# Patient Record
Sex: Female | Born: 1955 | Race: White | Hispanic: No | Marital: Married | Smoking: Former smoker
Health system: Southern US, Community
[De-identification: ages and names within clinical notes are randomized; demographics above are authoritative.]

## PROBLEM LIST (undated history)

## (undated) DIAGNOSIS — R079 Chest pain, unspecified: Secondary | ICD-10-CM

## (undated) DIAGNOSIS — M2040 Other hammer toe(s) (acquired), unspecified foot: Secondary | ICD-10-CM

## (undated) DIAGNOSIS — M26609 Unspecified temporomandibular joint disorder, unspecified side: Secondary | ICD-10-CM

## (undated) DIAGNOSIS — I69359 Hemiplegia and hemiparesis following cerebral infarction affecting unspecified side: Secondary | ICD-10-CM

## (undated) DIAGNOSIS — R413 Other amnesia: Secondary | ICD-10-CM

## (undated) DIAGNOSIS — R5381 Other malaise: Secondary | ICD-10-CM

## (undated) DIAGNOSIS — R51 Headache: Secondary | ICD-10-CM

## (undated) DIAGNOSIS — J189 Pneumonia, unspecified organism: Secondary | ICD-10-CM

## (undated) DIAGNOSIS — E559 Vitamin D deficiency, unspecified: Secondary | ICD-10-CM

## (undated) DIAGNOSIS — R5383 Other fatigue: Secondary | ICD-10-CM

## (undated) DIAGNOSIS — M7989 Other specified soft tissue disorders: Secondary | ICD-10-CM

## (undated) DIAGNOSIS — I729 Aneurysm of unspecified site: Secondary | ICD-10-CM

## (undated) DIAGNOSIS — R05 Cough: Secondary | ICD-10-CM

## (undated) DIAGNOSIS — E7800 Pure hypercholesterolemia, unspecified: Secondary | ICD-10-CM

## (undated) DIAGNOSIS — N390 Urinary tract infection, site not specified: Secondary | ICD-10-CM

## (undated) DIAGNOSIS — R209 Unspecified disturbances of skin sensation: Secondary | ICD-10-CM

## (undated) DIAGNOSIS — F32A Depression, unspecified: Secondary | ICD-10-CM

## (undated) DIAGNOSIS — I69398 Other sequelae of cerebral infarction: Secondary | ICD-10-CM

## (undated) DIAGNOSIS — R42 Dizziness and giddiness: Secondary | ICD-10-CM

## (undated) DIAGNOSIS — Z87442 Personal history of urinary calculi: Secondary | ICD-10-CM

## (undated) DIAGNOSIS — R350 Frequency of micturition: Secondary | ICD-10-CM

## (undated) DIAGNOSIS — F329 Major depressive disorder, single episode, unspecified: Secondary | ICD-10-CM

## (undated) DIAGNOSIS — I10 Essential (primary) hypertension: Secondary | ICD-10-CM

## (undated) DIAGNOSIS — J449 Chronic obstructive pulmonary disease, unspecified: Secondary | ICD-10-CM

## (undated) DIAGNOSIS — J3489 Other specified disorders of nose and nasal sinuses: Secondary | ICD-10-CM

## (undated) DIAGNOSIS — I639 Cerebral infarction, unspecified: Secondary | ICD-10-CM

## (undated) DIAGNOSIS — F339 Major depressive disorder, recurrent, unspecified: Secondary | ICD-10-CM

## (undated) DIAGNOSIS — J349 Unspecified disorder of nose and nasal sinuses: Secondary | ICD-10-CM

## (undated) DIAGNOSIS — R569 Unspecified convulsions: Secondary | ICD-10-CM

## (undated) DIAGNOSIS — R269 Unspecified abnormalities of gait and mobility: Secondary | ICD-10-CM

## (undated) DIAGNOSIS — F419 Anxiety disorder, unspecified: Secondary | ICD-10-CM

## (undated) DIAGNOSIS — I119 Hypertensive heart disease without heart failure: Secondary | ICD-10-CM

## (undated) DIAGNOSIS — G40909 Epilepsy, unspecified, not intractable, without status epilepticus: Secondary | ICD-10-CM

## (undated) DIAGNOSIS — H3530 Unspecified macular degeneration: Secondary | ICD-10-CM

## (undated) DIAGNOSIS — E669 Obesity, unspecified: Secondary | ICD-10-CM

## (undated) DIAGNOSIS — K219 Gastro-esophageal reflux disease without esophagitis: Secondary | ICD-10-CM

## (undated) DIAGNOSIS — E039 Hypothyroidism, unspecified: Secondary | ICD-10-CM

## (undated) DIAGNOSIS — N952 Postmenopausal atrophic vaginitis: Secondary | ICD-10-CM

## (undated) DIAGNOSIS — I517 Cardiomegaly: Secondary | ICD-10-CM

## (undated) DIAGNOSIS — D649 Anemia, unspecified: Secondary | ICD-10-CM

## (undated) DIAGNOSIS — N318 Other neuromuscular dysfunction of bladder: Secondary | ICD-10-CM

## (undated) DIAGNOSIS — E785 Hyperlipidemia, unspecified: Secondary | ICD-10-CM

## (undated) DIAGNOSIS — I671 Cerebral aneurysm, nonruptured: Secondary | ICD-10-CM

## (undated) DIAGNOSIS — R059 Cough, unspecified: Secondary | ICD-10-CM

## (undated) DIAGNOSIS — R479 Unspecified speech disturbances: Secondary | ICD-10-CM

## (undated) DIAGNOSIS — M778 Other enthesopathies, not elsewhere classified: Secondary | ICD-10-CM

## (undated) DIAGNOSIS — R0600 Dyspnea, unspecified: Secondary | ICD-10-CM

## (undated) HISTORY — DX: Cough, unspecified: R05.9

## (undated) HISTORY — DX: Gastro-esophageal reflux disease without esophagitis: K21.9

## (undated) HISTORY — DX: Cerebral aneurysm, nonruptured: I67.1

## (undated) HISTORY — DX: Unspecified macular degeneration: H35.30

## (undated) HISTORY — DX: Obesity, unspecified: E66.9

## (undated) HISTORY — DX: Cough: R05

## (undated) HISTORY — DX: Chronic obstructive pulmonary disease, unspecified: J44.9

## (undated) HISTORY — DX: Postmenopausal atrophic vaginitis: N95.2

## (undated) HISTORY — DX: Other amnesia: R41.3

## (undated) HISTORY — DX: Unspecified speech disturbances: R47.9

## (undated) HISTORY — DX: Other neuromuscular dysfunction of bladder: N31.8

## (undated) HISTORY — DX: Unspecified disturbances of skin sensation: R20.9

## (undated) HISTORY — PX: MANDIBLE FRACTURE SURGERY: SHX706

## (undated) HISTORY — DX: Dyspnea, unspecified: R06.00

## (undated) HISTORY — DX: Pure hypercholesterolemia, unspecified: E78.00

## (undated) HISTORY — DX: Other hammer toe(s) (acquired), unspecified foot: M20.40

## (undated) HISTORY — DX: Major depressive disorder, recurrent, unspecified: F33.9

## (undated) HISTORY — DX: Other specified disorders of nose and nasal sinuses: J34.89

## (undated) HISTORY — DX: Frequency of micturition: R35.0

## (undated) HISTORY — PX: BACK SURGERY: SHX140

## (undated) HISTORY — DX: Chest pain, unspecified: R07.9

## (undated) HISTORY — DX: Hemiplegia and hemiparesis following cerebral infarction affecting unspecified side: I69.359

## (undated) HISTORY — PX: ABDOMINAL HYSTERECTOMY: SHX81

## (undated) HISTORY — DX: Hypertensive heart disease without heart failure: I11.9

## (undated) HISTORY — DX: Vitamin D deficiency, unspecified: E55.9

## (undated) HISTORY — PX: OTHER SURGICAL HISTORY: SHX169

## (undated) HISTORY — PX: CEREBRAL ANGIOGRAM: SHX1326

## (undated) HISTORY — DX: Headache: R51

## (undated) HISTORY — DX: Urinary tract infection, site not specified: N39.0

## (undated) HISTORY — DX: Dizziness and giddiness: I69.398

## (undated) HISTORY — DX: Unspecified temporomandibular joint disorder, unspecified side: M26.609

## (undated) HISTORY — DX: Cardiomegaly: I51.7

## (undated) HISTORY — DX: Other enthesopathies, not elsewhere classified: M77.8

## (undated) HISTORY — DX: Unspecified disorder of nose and nasal sinuses: J34.9

## (undated) HISTORY — DX: Other fatigue: R53.83

## (undated) HISTORY — PX: LAPAROSCOPIC HYSTERECTOMY: SHX1926

## (undated) HISTORY — DX: Other specified soft tissue disorders: M79.89

## (undated) HISTORY — DX: Dizziness and giddiness: R42

## (undated) HISTORY — DX: Unspecified abnormalities of gait and mobility: R26.9

## (undated) HISTORY — DX: Hyperlipidemia, unspecified: E78.5

## (undated) HISTORY — DX: Other malaise: R53.81

---

## 1999-08-04 ENCOUNTER — Other Ambulatory Visit: Admission: RE | Admit: 1999-08-04 | Discharge: 1999-08-04 | Payer: Self-pay | Admitting: Family Medicine

## 2000-09-21 ENCOUNTER — Other Ambulatory Visit: Admission: RE | Admit: 2000-09-21 | Discharge: 2000-09-21 | Payer: Self-pay | Admitting: Obstetrics and Gynecology

## 2002-03-19 ENCOUNTER — Encounter: Admission: RE | Admit: 2002-03-19 | Discharge: 2002-06-17 | Payer: Self-pay | Admitting: Internal Medicine

## 2003-06-05 ENCOUNTER — Encounter: Payer: Self-pay | Admitting: Neurosurgery

## 2003-06-05 ENCOUNTER — Observation Stay (HOSPITAL_COMMUNITY): Admission: RE | Admit: 2003-06-05 | Discharge: 2003-06-06 | Payer: Self-pay | Admitting: Neurosurgery

## 2004-04-28 ENCOUNTER — Emergency Department (HOSPITAL_COMMUNITY): Admission: EM | Admit: 2004-04-28 | Discharge: 2004-04-28 | Payer: Self-pay | Admitting: Emergency Medicine

## 2004-04-30 ENCOUNTER — Encounter: Admission: RE | Admit: 2004-04-30 | Discharge: 2004-04-30 | Payer: Self-pay | Admitting: Internal Medicine

## 2004-05-20 ENCOUNTER — Encounter: Admission: RE | Admit: 2004-05-20 | Discharge: 2004-05-20 | Payer: Self-pay | Admitting: Internal Medicine

## 2005-05-24 ENCOUNTER — Encounter: Admission: RE | Admit: 2005-05-24 | Discharge: 2005-05-24 | Payer: Self-pay | Admitting: Internal Medicine

## 2005-05-26 ENCOUNTER — Ambulatory Visit (HOSPITAL_COMMUNITY): Admission: RE | Admit: 2005-05-26 | Discharge: 2005-05-26 | Payer: Self-pay | Admitting: Gastroenterology

## 2005-05-26 ENCOUNTER — Encounter (INDEPENDENT_AMBULATORY_CARE_PROVIDER_SITE_OTHER): Payer: Self-pay | Admitting: *Deleted

## 2005-09-04 ENCOUNTER — Inpatient Hospital Stay (HOSPITAL_COMMUNITY): Admission: EM | Admit: 2005-09-04 | Discharge: 2005-09-11 | Payer: Self-pay | Admitting: Emergency Medicine

## 2005-09-23 ENCOUNTER — Encounter: Admission: RE | Admit: 2005-09-23 | Discharge: 2005-09-23 | Payer: Self-pay | Admitting: Internal Medicine

## 2005-10-25 ENCOUNTER — Encounter: Admission: RE | Admit: 2005-10-25 | Discharge: 2005-10-25 | Payer: Self-pay | Admitting: Internal Medicine

## 2005-10-28 ENCOUNTER — Encounter: Admission: RE | Admit: 2005-10-28 | Discharge: 2005-10-28 | Payer: Self-pay | Admitting: Internal Medicine

## 2005-12-06 ENCOUNTER — Ambulatory Visit: Payer: Self-pay | Admitting: Internal Medicine

## 2006-01-24 ENCOUNTER — Ambulatory Visit: Payer: Self-pay | Admitting: Internal Medicine

## 2006-02-21 ENCOUNTER — Ambulatory Visit: Payer: Self-pay | Admitting: Internal Medicine

## 2006-06-23 ENCOUNTER — Encounter: Admission: RE | Admit: 2006-06-23 | Discharge: 2006-06-23 | Payer: Self-pay | Admitting: Internal Medicine

## 2006-07-26 ENCOUNTER — Encounter: Admission: RE | Admit: 2006-07-26 | Discharge: 2006-07-26 | Payer: Self-pay | Admitting: Internal Medicine

## 2006-11-06 ENCOUNTER — Emergency Department (HOSPITAL_COMMUNITY): Admission: EM | Admit: 2006-11-06 | Discharge: 2006-11-06 | Payer: Self-pay | Admitting: Emergency Medicine

## 2006-11-22 ENCOUNTER — Ambulatory Visit: Payer: Self-pay | Admitting: Cardiovascular Disease

## 2006-12-06 ENCOUNTER — Ambulatory Visit: Payer: Self-pay

## 2007-10-30 ENCOUNTER — Encounter: Admission: RE | Admit: 2007-10-30 | Discharge: 2007-10-30 | Payer: Self-pay | Admitting: Internal Medicine

## 2007-11-08 ENCOUNTER — Encounter: Admission: RE | Admit: 2007-11-08 | Discharge: 2007-11-08 | Payer: Self-pay | Admitting: Internal Medicine

## 2008-04-25 DIAGNOSIS — J45909 Unspecified asthma, uncomplicated: Secondary | ICD-10-CM | POA: Insufficient documentation

## 2008-04-25 DIAGNOSIS — J449 Chronic obstructive pulmonary disease, unspecified: Secondary | ICD-10-CM | POA: Insufficient documentation

## 2008-04-25 DIAGNOSIS — E785 Hyperlipidemia, unspecified: Secondary | ICD-10-CM | POA: Insufficient documentation

## 2008-04-25 DIAGNOSIS — F17200 Nicotine dependence, unspecified, uncomplicated: Secondary | ICD-10-CM | POA: Insufficient documentation

## 2008-04-28 ENCOUNTER — Ambulatory Visit: Payer: Self-pay | Admitting: Internal Medicine

## 2008-06-05 ENCOUNTER — Ambulatory Visit: Payer: Self-pay | Admitting: Internal Medicine

## 2008-06-10 ENCOUNTER — Ambulatory Visit: Payer: Self-pay | Admitting: Internal Medicine

## 2008-06-10 DIAGNOSIS — R05 Cough: Secondary | ICD-10-CM | POA: Insufficient documentation

## 2008-06-10 DIAGNOSIS — R059 Cough, unspecified: Secondary | ICD-10-CM | POA: Insufficient documentation

## 2008-06-10 LAB — CONVERTED CEMR LAB: A-1 Antitrypsin, Ser: 217 mg/dL — ABNORMAL HIGH (ref 83–200)

## 2008-06-11 ENCOUNTER — Ambulatory Visit: Payer: Self-pay | Admitting: Internal Medicine

## 2008-06-19 ENCOUNTER — Telehealth (INDEPENDENT_AMBULATORY_CARE_PROVIDER_SITE_OTHER): Payer: Self-pay | Admitting: *Deleted

## 2008-07-22 ENCOUNTER — Ambulatory Visit: Payer: Self-pay | Admitting: Internal Medicine

## 2008-09-04 ENCOUNTER — Ambulatory Visit: Payer: Self-pay

## 2008-09-04 ENCOUNTER — Encounter (INDEPENDENT_AMBULATORY_CARE_PROVIDER_SITE_OTHER): Payer: Self-pay | Admitting: Internal Medicine

## 2009-01-25 ENCOUNTER — Emergency Department (HOSPITAL_COMMUNITY): Admission: EM | Admit: 2009-01-25 | Discharge: 2009-01-25 | Payer: Self-pay | Admitting: Emergency Medicine

## 2009-01-25 ENCOUNTER — Emergency Department (HOSPITAL_COMMUNITY): Admission: EM | Admit: 2009-01-25 | Discharge: 2009-01-25 | Payer: Self-pay | Admitting: Family Medicine

## 2009-06-15 ENCOUNTER — Encounter: Admission: RE | Admit: 2009-06-15 | Discharge: 2009-06-15 | Payer: Self-pay | Admitting: Internal Medicine

## 2009-06-15 ENCOUNTER — Encounter: Payer: Self-pay | Admitting: Internal Medicine

## 2009-09-05 DIAGNOSIS — I639 Cerebral infarction, unspecified: Secondary | ICD-10-CM

## 2009-09-05 HISTORY — DX: Cerebral infarction, unspecified: I63.9

## 2009-10-01 ENCOUNTER — Ambulatory Visit: Payer: Self-pay | Admitting: Internal Medicine

## 2009-11-22 ENCOUNTER — Inpatient Hospital Stay (HOSPITAL_COMMUNITY): Admission: EM | Admit: 2009-11-22 | Discharge: 2009-11-25 | Payer: Self-pay | Admitting: Emergency Medicine

## 2009-11-22 ENCOUNTER — Ambulatory Visit: Payer: Self-pay | Admitting: Internal Medicine

## 2009-11-22 ENCOUNTER — Emergency Department (HOSPITAL_COMMUNITY): Admission: EM | Admit: 2009-11-22 | Discharge: 2009-11-22 | Payer: Self-pay | Admitting: Emergency Medicine

## 2009-11-23 ENCOUNTER — Encounter (INDEPENDENT_AMBULATORY_CARE_PROVIDER_SITE_OTHER): Payer: Self-pay | Admitting: Internal Medicine

## 2009-11-27 ENCOUNTER — Inpatient Hospital Stay (HOSPITAL_COMMUNITY): Admission: EM | Admit: 2009-11-27 | Discharge: 2009-12-09 | Payer: Self-pay | Admitting: Emergency Medicine

## 2009-11-27 ENCOUNTER — Ambulatory Visit: Payer: Self-pay | Admitting: Pulmonary Disease

## 2009-12-03 ENCOUNTER — Ambulatory Visit: Payer: Self-pay | Admitting: Physical Medicine & Rehabilitation

## 2009-12-31 ENCOUNTER — Emergency Department (HOSPITAL_COMMUNITY): Admission: EM | Admit: 2009-12-31 | Discharge: 2009-12-31 | Payer: Self-pay | Admitting: Emergency Medicine

## 2010-03-05 ENCOUNTER — Encounter: Admission: RE | Admit: 2010-03-05 | Discharge: 2010-06-02 | Payer: Self-pay | Admitting: Internal Medicine

## 2010-05-04 ENCOUNTER — Encounter: Admission: RE | Admit: 2010-05-04 | Discharge: 2010-05-04 | Payer: Self-pay | Admitting: Neurology

## 2010-05-28 ENCOUNTER — Ambulatory Visit (HOSPITAL_COMMUNITY): Admission: RE | Admit: 2010-05-28 | Discharge: 2010-05-28 | Payer: Self-pay | Admitting: Neurology

## 2010-06-04 ENCOUNTER — Encounter: Payer: Self-pay | Admitting: Interventional Radiology

## 2010-07-06 ENCOUNTER — Encounter: Admission: RE | Admit: 2010-07-06 | Discharge: 2010-07-06 | Payer: Self-pay | Admitting: Internal Medicine

## 2010-07-16 ENCOUNTER — Encounter: Admission: RE | Admit: 2010-07-16 | Discharge: 2010-07-16 | Payer: Self-pay | Admitting: Internal Medicine

## 2010-07-23 ENCOUNTER — Ambulatory Visit (HOSPITAL_COMMUNITY): Admission: RE | Admit: 2010-07-23 | Discharge: 2010-07-23 | Payer: Self-pay | Admitting: Gastroenterology

## 2010-09-26 ENCOUNTER — Encounter: Payer: Self-pay | Admitting: Neurology

## 2010-09-27 ENCOUNTER — Encounter: Payer: Self-pay | Admitting: Internal Medicine

## 2010-10-05 NOTE — Assessment & Plan Note (Signed)
Summary: Pulmonary/ fu copd/pft's   PCP:  Allyne Gee  Chief Complaint:  6 wk followup.  Pt states that breathing is no better.  She gets very sob walking up the stairs to her home.  She also states breathing is worse at night.  Pt states that husband says it sounds like she is choking in the night.  She also c/o fatigue "all the time"..  History of Present Illness: 23 yowf still actively smoking with PFT's in 5/07 of FEV1 69% with ratio 68% and DLC0 57%   April 28, 2008 initial eval c/o :  worse since 10/08 with doe and cough productive of green thick mucus does seem some better after antibiotics never completely clear   doe x across parking lot sob to point of stopping on arrival  to the store but then can do the ailes ok by leaning on the cart.   June 10, 2008  ov  breathing better with symbicort but still green sputum even while on abx, worse in am's but not disturbing sleep.    July 22, 2008 no change in doe, cough waxes and wanes, worse in am, not consistently purulent. Pt denies any significant sore throat, nasal congestion or excess secretions, fever, chills, sweats, unintended wt loss, pleuritic or exertional cp, orthopnea pnd or leg swelling.  Pt also denies any obvious fluctuation in symptoms with weather or environmental change or other alleviating or aggravating factors.        Serial Vital Signs/Assessments:  Comments: 2:38 PM Ambulatory Pulse Oximetry  Resting; HR__90___    02 Sat__94%ra___  Lap1 (185 feet)   HR__108___   02 Sat__94%ra___ Lap2 (185 feet)   HR___107__   02 Sat__93%ra___    Lap3 (185 feet)   HR__111___   02 Sat__93%ra___  _x__Test Completed without Difficulty ___Test Stopped due to:   By: Vernie Murders      Updated Prior Medication List: SYMBICORT 160-4.5 MCG/ACT  AERO (BUDESONIDE-FORMOTEROL FUMARATE) Two puffs in am and 2 puffs in BAYER ASPIRIN 325 MG TABS (ASPIRIN) Take 1 tablet by mouth once a day GABAPENTIN 300 MG CAPS (GABAPENTIN)  Take 1 capsule by mouth three times a day PREMARIN 0.625 MG TABS (ESTROGENS CONJUGATED) Take 1 tablet by mouth once a day LEXAPRO 20 MG TABS (ESCITALOPRAM OXALATE) Take 1 tablet by mouth once a day DIOVAN HCT 160-12.5 MG TABS (VALSARTAN-HYDROCHLOROTHIAZIDE) Take 1 tablet by mouth once a day CRESTOR 10 MG TABS (ROSUVASTATIN CALCIUM) Take 1 tablet by mouth once a day ZEGERID 40-1100 MG  CAPS (OMEPRAZOLE-SODIUM BICARBONATE) One by mouth  at bedtime FEXOFENADINE HCL 180 MG TABS (FEXOFENADINE HCL) one daily for itching sneezing or watery nose as sinus PROAIR HFA 108 (90 BASE) MCG/ACT AERS (ALBUTEROL SULFATE) inhale 2 puffs every 4 to 6 hours as needed  Current Allergies (reviewed today): No known allergies   Past Medical History:    Reviewed history from 06/10/2008 and no changes required:       Asthma       C O P D          - PFTs  01/24/06 FEV1 69% ratio 68% diffusing capacity 57% with no improvement after B2          - PFT's 10/1/ 09 FEV1 77   ratio  76    dlco                   45            no resp to  B2          - Nl alpha one antitripsin level 10/09       Hyperlipidemia   Family History:    Reviewed history from 04/25/2008 and no changes required:       lung CA-lung cancer       heart disease-father  Social History:    Reviewed history from 04/28/2008 and no changes required:       Still actively smoking   Risk Factors:  Tobacco use:  current    Vital Signs:  Patient Profile:   55 Years Old Female Weight:      167 pounds O2 Sat:      95 % O2 treatment:    Room Air Temp:     98.4 degrees F oral Pulse rate:   78 / minute BP sitting:   124 / 82  (left arm)  Vitals Entered By: Vernie Murders (July 22, 2008 2:08 PM)                 Physical Exam  ambulatory middle-aged white female who appears older than stated age in no acute distress.  wt  163 June 10, 2008 > 167 July 22, 2008  HEENT mild turbinate edema.  Oropharynx no thrush or excess pnd or  cobblestoning.  No JVD or cervical adenopathy. Mild accessory muscle hypertrophy. Trachea midline, nl thryroid. Chest was hyperinflated by percussion with diminished breath sounds and moderate increased exp time without wheeze. Hoover sign positive at mid inspiration. Regular rate and rhythm without murmur gallop or rub or increase P2.  Abd: no hsm, nl excursion. Ext warm without C,C or E.        Problem # 1:  C O P D (ICD-496) I spent extra time with the patient today explaining optimal mdi  technique.  This improved from  50-75%   I took this opportunity to educate the patient regarding the consequences of smoking in airway disorders based on all the data we have from the multiple national lung health studies indicating that smoking cessation, not choice of inhalers or physicians, is the most important aspect of care.     Each maintenance medication was reviewed in detail including most importantly the difference between maintenance and as needed and under what circumstances the prns are to be used.    Patient Instructions: 1)  Committ to quit smoking 2)  If your breathing worsens or you need to use your rescue inhaler(proaire)  more than twice weekly or wake up more than twice a month with any respiratory symptoms or require more than two rescue inhalers per year, we need to see you right away. 3)   Please schedule a follow-up appointment in 1 year, sooner if still coughing 6 weeks after smoking 4)  mucinex dm is the best choice for smokers cough   ]

## 2010-10-05 NOTE — Miscellaneous (Signed)
Summary: Orders Update pft charges  Clinical Lists Changes  Orders: Added new Service order of Carbon Monoxide diffusing w/capacity (94720) - Signed Added new Service order of Lung Volumes (94240) - Signed Added new Service order of Spirometry (Pre & Post) (94060) - Signed 

## 2010-10-05 NOTE — Assessment & Plan Note (Signed)
Summary: Pulmonary/ ext ov with smoking counseling    Primary Provider/Referring Provider:  Allyne Gee  CC:  Yearly followup.  Pt states that she has had bronchitis 3 times in the past year and PNA twice and was txed by Dr Allyne Gee.  Today she c/o prod cough with green sputum x 6 months.  She also c/o increased SOB - "anytime I move".  She states that she stays tired all of the time.Marland Kitchen  History of Present Illness: 40 yowf still actively smoking with PFT's in 5/07 of FEV1 69% with ratio 68% and DLC0 57%   April 28, 2008 initial eval c/o :  worse since 10/08 with doe and cough productive of green thick mucus does seem some better after antibiotics never completely clear   doe x across parking lot sob to point of stopping on arrival  to the store but then can do the ailes ok by leaning on the cart.   June 10, 2008  ov  breathing better with symbicort but still green sputum even while on abx, worse in am's but not disturbing sleep.    July 22, 2008 no change in doe, cough waxes and wanes, worse in am, not consistently purulent.   October 01, 2009 Yearly followup.  Pt states that she has had bronchitis 3 times in the past year and PNA twice and was txed by Dr Allyne Gee.  Today she c/o prod cough with green sputum x 6 months.  She also c/o increased SOB - "anytime I move".  She states that she stays tired all of the time and sleeping poorly with nocturnal choking. Pt denies any significant sore throat, dysphagia, itching, sneezing,  nasal congestion or excess secretions,  fever, chills, sweats, unintended wt loss, pleuritic or exertional cp, hempoptysis, orthopnea pnd or leg swelling Pt also denies any obvious fluctuation in symptoms with weather or environmental change or other alleviating or aggravating factors.       Current Medications (verified): 1)  Symbicort 160-4.5 Mcg/act  Aero (Budesonide-Formoterol Fumarate) .... Two Puffs in Am and 2 Puffs In 2)  Bayer Aspirin 325 Mg Tabs (Aspirin)  .... Take 1 Tablet By Mouth Once A Day 3)  Gabapentin 300 Mg Caps (Gabapentin) .... Take 1 Capsule By Mouth Three Times A Day 4)  Premarin 0.625 Mg Tabs (Estrogens Conjugated) .... Take 1 Tablet By Mouth Once A Day 5)  Lexapro 20 Mg Tabs (Escitalopram Oxalate) .... Take 1 Tablet By Mouth Once A Day 6)  Diovan Hct 160-12.5 Mg Tabs (Valsartan-Hydrochlorothiazide) .... Take 1 Tablet By Mouth Once A Day 7)  Crestor 10 Mg Tabs (Rosuvastatin Calcium) .... Take 1 Tablet By Mouth Once A Day 8)  Zegerid 40-1100 Mg  Caps (Omeprazole-Sodium Bicarbonate) .... One By Mouth  At Bedtime 9)  Fexofenadine Hcl 180 Mg Tabs (Fexofenadine Hcl) .... One Daily For Itching Sneezing or Watery Nose As Sinus 10)  Proair Hfa 108 (90 Base) Mcg/act Aers (Albuterol Sulfate) .... Inhale 2 Puffs Every 4 To 6 Hours As Needed 11)  Albuterol Sulfate (2.5 Mg/68ml) 0.083% Nebu (Albuterol Sulfate) .Marland Kitchen.. 1 in Nebulizer 1 To 2 Times Per Day As Needed 12)  Vitamin D3 1.25 Mg .Marland Kitchen.. 1 Every Wk 13)  Vitamin B-12 .Marland Kitchen.. 1 Inj Monthly 14)  Deplin 15 Mg Tabs (L-Methylfolate) .Marland Kitchen.. 1 Once Daily 15)  Vitamin B-12 2500 Mcg Subl (Cyanocobalamin) .Marland Kitchen.. 1 Tab 2 To 3 Times Per Day 16)  Vitamin C Cr 500 Mg Cr-Caps (Ascorbic Acid) .Marland Kitchen.. 1 Once Daily  Allergies (  verified): No Known Drug Allergies  Past History:  Past Medical History: Asthma C O P D    - PFTs  01/24/06 FEV1 69% ratio 68% diffusing capacity 57% with no improvement after B2    - PFT's 10/1/ 09 FEV1 77   ratio  76    dlco                        45            no resp to B2    - Nl alpha one antitripsin level 10/09 Hyperlipidemia Chronic cough     - Sinus ct 06/11/08 wnl  Vital Signs:  Patient profile:   55 year old female Height:      62 inches Weight:      178 pounds BMI:     32.67 O2 Sat:      90 % on Room air Temp:     98.2 degrees F oral Pulse rate:   94 / minute BP sitting:   122 / 84  (left arm)  Vitals Entered By: Vernie Murders (October 01, 2009 10:27 AM)  O2 Flow:  Room  air  Physical Exam  Additional Exam:  ambulatory middle-aged white female who appears older than stated age in no acute distress.  wt  163 June 10, 2008 > 167 July 22, 2008 > 178 October 01, 2009  HEENT mild turbinate edema.  Oropharynx no thrush or excess pnd or cobblestoning.  No JVD or cervical adenopathy. Mild accessory muscle hypertrophy. Trachea midline, nl thryroid. Chest was hyperinflated by percussion with diminished breath sounds and moderate increased exp time without wheeze. Hoover sign positive at mid inspiration. Regular rate and rhythm without murmur gallop or rub or increase P2. No edema. Abd: no hsm, nl excursion. Ext warm without cyanosis or clubbing.      CXR  Procedure date:  06/15/2009  Findings:      no active dz ? mild peribronchial thickening  Impression & Recommendations:  Problem # 1:  C O P D (ICD-496) I had an extended discussion with the patient today lasting 15 to 20 minutes of a 25 minute visit on the following issues:  I spent extra time with the patient today explaining optimal mdi  technique.  This improved from  50-75%   Each maintenance medication was reviewed in detail including most importantly the difference between maintenance and as needed and under what circumstances the prns are to be used. See instructions for specific recommendations   Rx aecopd with short course of prednisone and empiric levaquin for purulent bronchitis by hx  Problem # 2:  TOBACCO ABUSE (ICD-305.1)  > 3 min separate discussion  Discussed but not ready to committ to quit at this point - emphasized risks involved in continuing smoking and that patient should consider these in the context of the cost of smoking relative to the benefit obtained.  I took this opportunity to educate the patient regarding the consequences of smoking in airway disorders based on all the data we have from the multiple national lung health studies indicating that smoking cessation, not  choice of inhalers or physicians, is the most important aspect of care. Regular  pulmonary f/u in this setting is not going to be helpful and if she quits smoking now will not likely be needed.  Orders: Tobacco use cessation intermediate 3-10 minutes (08657)  Medications Added to Medication List This Visit: 1)  Symbicort 160-4.5 Mcg/act Aero (  Budesonide-formoterol fumarate) .... 2 puffs first thing  in am and 2 puffs again in pm about 12 hours later 2)  Albuterol Sulfate (2.5 Mg/43ml) 0.083% Nebu (Albuterol sulfate) .Marland Kitchen.. 1 in nebulizer 1 to 2 times per day as needed 3)  Vitamin D3 1.25 Mg  .Marland Kitchen.. 1 every wk 4)  Vitamin B-12  .Marland Kitchen.. 1 inj monthly 5)  Deplin 15 Mg Tabs (L-methylfolate) .Marland Kitchen.. 1 once daily 6)  Vitamin B-12 2500 Mcg Subl (Cyanocobalamin) .Marland Kitchen.. 1 tab 2 to 3 times per day 7)  Vitamin C Cr 500 Mg Cr-caps (Ascorbic acid) .Marland Kitchen.. 1 once daily 8)  * 02 2lpm At Bedtime  9)  Prednisone 10 Mg Tabs (Prednisone) .... 4 each am x 2days, 2x2days, 1x2days and stop 10)  Levaquin 750 Mg Tabs (Levofloxacin) .... One tablet by mouth daily 11)  Combivent 103-18 Mcg/act Aero (Ipratropium-albuterol) .... 2 every 4 hours if needed  Other Orders: Est. Patient Level IV (93235)  Patient Instructions: 1)  Symbicort 160 2 puffs first thing  in am and 2 puffs again in pm about 12 hours later  2)  Prefer combivent as rescue everh 4 hours as needed and only use if nebulizer when severe and not responding to combivent 3)  Levaquin 750 one daily x 5 days 4)  Prednisone 4 each am x 2days, 2x2days, 1x2days and stop  5)  Your lung function is not bad but the only way to preserve it without getting worse is stopping and continuing to smoke is interfering with the benefit of the meds 6)  GERD (REFLUX)  is a common cause of respiratory symptoms. It commonly presents without heartburn and can be treated with medication, but also with lifestyle changes including avoidance of late meals, excessive alcohol, smoking cessation,  and avoid fatty foods, chocolate, peppermint, colas, red wine, and acidic juices such as orange juice. NO MINT OR MENTHOL PRODUCTS SO NO COUGH DROPS  7)  USE SUGARLESS CANDY INSTEAD (jolley ranchers)  8)  NO OIL BASED VITAMINS  9)  Committ to quit is the key 10)  Please schedule a follow-up appointment as needed. Prescriptions: COMBIVENT 103-18 MCG/ACT AERO (IPRATROPIUM-ALBUTEROL) 2 every 4 hours if needed  #1 x 11   Entered and Authorized by:   Nyoka Cowden MD   Signed by:   Nyoka Cowden MD on 10/01/2009   Method used:   Electronically to        [facility] [id number]       [address]       [place], [place]  [address]       Ph: [phone] or [phone]       Fax: [phone]   RxID:   [id number] LEVAQUIN 750 MG  TABS (LEVOFLOXACIN) One tablet by mouth daily  #5 x 0   Entered and Authorized by:   Nyoka Cowden MD   Signed by:   Nyoka Cowden MD on 10/01/2009   Method used:   [facility] to        [facility] [id number]       [address]       [place], [place]  [address]       Ph: [phone] or [phone]       Fax: [phone]   RxID:   [id number] PREDNISONE 10 MG  TABS (PREDNISONE) 4 each am x 2days, 2x2days, 1x2days and  stop  #14 x 0   Entered and Authorized by:   Nyoka Cowden MD   Signed by:   Nyoka Cowden MD on 10/01/2009   Method used:   [facility]        [id number]       [address]       [place], [place]  [address]       Ph: [phone] or [phone]       Fax: [phone]   RxID:   [id number]

## 2010-10-05 NOTE — Assessment & Plan Note (Signed)
Summary: Pulmonary/ fu PFT's check alpha1   PCP:  Allyne Gee  Chief Complaint:  1 month followup.  Review PFT's.  Pt states cough is the same, no better or worse since last seen .  She states cough is prod with thick, and brownish green sputum.[name]  History of Present Illness: 55 yowf still actively smoking with PFT's in 5/07 of FEV1 69% with ratio 68% and DLC057%   April 28, 2008 initial eval c/o :  worse since 10/08 with doe and cough productive of green thick mucus does seem some better after antibiotics never completely clear   doe x across parking lot sob to point of stopping on arrival  to the store but then can do the ailes ok by leaning on the cart.   June 10, 2008  ov  breathing better with symbicort but still green sputum even while on abx, worse in am's but not disturbing sleep.  Pt denies any significant sore throat, nasal congestion or excess secretions, fever, chills, sweats, unintended wt loss, pleuritic or exertional cp, orthopnea pnd or leg swelling.  Pt also denies any obvious fluctuation in symptoms with weather or environmental change or other alleviating or aggravating factors.     Pt denies any increase in rescue therapy over baseline, denies waking up needing it or having early am exacerbations of coughing/wheezing/ or dyspnea       Updated Prior Medication List: SYMBICORT 160-4.5 MCG/ACT  AERO (BUDESONIDE-FORMOTEROL FUMARATE) Two puffs in am and 2 puffs in BAYER ASPIRIN 325 MG TABS (ASPIRIN) Take 1 tablet by mouth once a day GABAPENTIN 300 MG CAPS (GABAPENTIN) Take 1 capsule by mouth three times a day PREMARIN 0.625 MG TABS (ESTROGENS CONJUGATED) Take 1 tablet by mouth once a day LEXAPRO 20 MG TABS (ESCITALOPRAM OXALATE) Take 1 tablet by mouth once a day DIOVAN HCT 160-12.5 MG TABS (VALSARTAN-HYDROCHLOROTHIAZIDE) Take 1 tablet by mouth once a day CRESTOR 10 MG TABS (ROSUVASTATIN CALCIUM) Take 1 tablet by mouth once a day FEXOFENADINE HCL 180 MG TABS (FEXOFENADINE  HCL) Take 1 tablet by mouth once a day PROAIR HFA 108 (90 BASE) MCG/ACT AERS (ALBUTEROL SULFATE) inhale 2 puffs every 4 to 6 hours as needed  Current Allergies (reviewed today): No known allergies   Past Medical History:    Asthma    C O P D       - PFTs  01/24/06 FEV1 69% ratio 68% diffusing capacity 57% with no improvement after B2       - PFT's 10/1/ 09 FEV1 77   ratio  76    dlco                   45            no resp to B2    Hyperlipidemia   Family History:    Reviewed history from 04/25/2008 and no changes required:       lung CA-lung cancer       heart disease-father  Social History:    Reviewed history from 04/28/2008 and no changes required:       Still actively smoking    Review of Systems  The patient denies anorexia, fever, weight loss, weight gain, vision loss, decreased hearing, hoarseness, chest pain, syncope, dyspnea on exertion, peripheral edema, headaches, hemoptysis, abdominal pain, melena, hematochezia, severe indigestion/heartburn, hematuria, incontinence, muscle weakness, suspicious skin lesions, transient blindness, difficulty walking, depression, unusual weight change, abnormal bleeding, enlarged lymph nodes, and angioedema.  Vital Signs:  Patient Profile:   55 Years Old Female Weight:      163.50 pounds O2 Sat:      93 % O2 treatment:    Room Air Temp:     98.0 degrees F oral Pulse rate:   97 / minute BP sitting:   110 / 72  (left arm)  Vitals Entered By: Vernie Murders (June 10, 2008 11:04 AM)                 Physical Exam  ambulatory middle-aged white female who appears older than stated age in no acute distress. wt 163 > 163 June 10, 2008  HEENT mild turbinate edema.  Oropharynx no thrush or excess pnd or cobblestoning.  No JVD or cervical adenopathy. Mild accessory muscle hypertrophy. Trachea midline, nl thryroid. Chest was hyperinflated by percussion with diminished breath sounds and moderate increased exp time without wheeze.  Hoover sign positive at mid inspiration. Regular rate and rhythm without murmur gallop or rub or increase P2.  Abd: no hsm, nl excursion. Ext warm without C,C or E.        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  This improved from  50 -75%   I took this opportunity to educate the patient regarding the consequences of smoking in airway disorders based on all the data we have from the multiple national lung health studies indicating that smoking cessation, not choice of inhalers or physicians, is the most important aspect of care.    She has mostly emphysema with minimal airflow obstruction on present rx, so no change in meds needed. Does need alpha 1 AT level to complete the wu.  Problem # 2:  COUGH (ICD-786.2) disproportionate to objective findings. DDX is cough variant asthma, upper airway cough syndrome (previously termed post nasal drip syndrome) or GERD, although many studies of chronic cough show pts have more than one mechanism playing a role   Since the asthma component should be well addressed with symbicort,  try short course PPI plus proceed with sinus ct.  Medications Added to Medication List This Visit: 1)  Zegerid 40-1100 Mg Caps (Omeprazole-sodium bicarbonate) .... One by mouth  at bedtime 2)  Fexofenadine Hcl 180 Mg Tabs (Fexofenadine hcl) .... One daily for itching sneezing or watery nose as sinus  Complete Medication List: 1)  Symbicort 160-4.5 Mcg/act Aero (Budesonide-formoterol fumarate) .... Two puffs in am and 2 puffs in 2)  Bayer Aspirin 325 Mg Tabs (Aspirin) .... Take 1 tablet by mouth once a day 3)  Gabapentin 300 Mg Caps (Gabapentin) .... Take 1 capsule by mouth three times a day 4)  Premarin 0.625 Mg Tabs (Estrogens conjugated) .... Take 1 tablet by mouth once a day 5)  Lexapro 20 Mg Tabs (Escitalopram oxalate) .... Take 1 tablet by mouth once a day 6)  Diovan  Hct 160-12.5 Mg Tabs (Valsartan-hydrochlorothiazide) .... Take 1 tablet by mouth once a day 7)  Crestor 10 Mg Tabs (Rosuvastatin calcium) .... Take 1 tablet by mouth once a day 8)  Zegerid 40-1100 Mg Caps (Omeprazole-sodium bicarbonate) .... One by mouth  at bedtime 9)  Fexofenadine Hcl 180 Mg Tabs (Fexofenadine hcl) .... One daily for itching sneezing or watery nose as sinus 10)  Proair Hfa 108 (90 Base) Mcg/act Aers (Albuterol sulfate) .... Inhale 2 puffs every 4 to 6 hours as needed  Other  Orders: Misc. Referral (Misc. Ref)   Patient Instructions: 1)  GERD (gastroesophageal reflux disease) was discussed. It is a common cause of respiratory symptoms. It commonly presents in the absence of heartburn. GERD can be treated with medication, but also with lifestyle changes including avoidance of late meals, excessive alcohol, smoking cessation, and avoid fatty foods, chocolate, peppermint, colas, red wine, and acidic juices such as orange juice. NO MINT OR MENTHOL PRODUCTS 2)  USE SUGARLESS CANDY INSTEAD (jolley ranchers)  3)  Please schedule a follow-up appointment in 6 weeks, sooner if needed   Prescriptions: ZEGERID 40-1100 MG  CAPS (OMEPRAZOLE-SODIUM BICARBONATE) One by mouth  at bedtime  #34 x 2   Entered and Authorized by:   Nyoka Cowden MD   Signed by:   Nyoka Cowden MD on 06/10/2008   Method used:   [facility] [id number]       [address]       [place], [place]  [address]       Ph: [phone] or [phone]       Fax: [phone]   RxID:   [id number]  ]

## 2010-10-05 NOTE — Progress Notes (Signed)
Summary: [facility]  Phone Note Call from Patient Call back at Home Phone [phone]   Caller: Patient Call For: wert Summary of Call: [facility] [address] but they say that they did not get it  Initial call taken by: Lacinda Axon,  June 19, 2008 12:55 PM  Follow-up for Phone Call        Spoke with pharmacist Ortencia Kick at [facility].  Rx was never recieved.  Phoned in and lmom for pt to be aware. Follow-up by: Vernie Murders,  June 19, 2008 2:00 PM

## 2010-10-05 NOTE — Assessment & Plan Note (Signed)
Summary: Pulmonary/ New eval for COPD   PCP:  Allyne Gee  Chief Complaint:  Pt is here for a 55 yowf c/o increased sob with exertion and coughing up thick green sputum.  Pt also c/o difficulty sleeping due to sob. Marland Kitchen  History of Present Illness: 55 yowf still actively smoking with PFT's in 5/07 of FEV1 69% with ratio 68% and DLC057%   April 28, 2008 worse since 10/08 with doe and cough productive of green thick mucus does seem some better after antibiotics never completely clear   doe x across parking lot sob to point of stopping on arrival  to the store but then can do the ailes ok by leaning on the cart. Pt denies any significant sore throat, nasal congestion or excess secretions, fever, chills, sweats, unintended wt loss, pleuritic or exertional cp, orthopnea pnd or leg swelling.  Pt also denies any obvious fluctuation in symptoms with weather or environmental change or other alleviating or aggravating factors.     Pt denies any increase in rescue therapy over baseline, denies waking up needing it or having early am exacerbations of coughing/wheezing/ or dyspnea        Updated Prior Medication List: BAYER ASPIRIN 325 MG TABS (ASPIRIN) Take 1 tablet by mouth once a day GABAPENTIN 300 MG CAPS (GABAPENTIN) Take 1 capsule by mouth three times a day PREMARIN 0.625 MG TABS (ESTROGENS CONJUGATED) Take 1 tablet by mouth once a day LEXAPRO 20 MG TABS (ESCITALOPRAM OXALATE) Take 1 tablet by mouth once a day DIOVAN HCT 160-12.5 MG TABS (VALSARTAN-HYDROCHLOROTHIAZIDE) Take 1 tablet by mouth once a day ALBUTEROL SULFATE (2.5 MG/3ML) 0.083% NEBU (ALBUTEROL SULFATE) as needed PROAIR HFA 108 (90 BASE) MCG/ACT AERS (ALBUTEROL SULFATE) inhale 2 puffs every 4 to 6 hours as needed ADVAIR DISKUS 250-50 MCG/DOSE MISC (FLUTICASONE-SALMETEROL) inhale 1 puffs two times a day CRESTOR 10 MG TABS (ROSUVASTATIN CALCIUM) Take 1 tablet by mouth once a day FEXOFENADINE HCL 180 MG TABS (FEXOFENADINE HCL) Take 1  tablet by mouth once a day BUDEPRION SR 150 MG XR12H-TAB (BUPROPION HCL) Take 1 tablet by mouth once a day PULMICORT FLEXHALER 180 MCG/ACT AEPB (BUDESONIDE) inhale 1 puff two times a day  Current Allergies (reviewed today): No known allergies   Past Medical History:    Reviewed history from 04/25/2008 and no changes required:       Asthma       C O P D          - PFTs 01/24/06 FEV1 69% ratio 68% diffusing capacity 57% with no improvement after bronchodilators       Hyperlipidemia   Family History:    Reviewed history from 04/25/2008 and no changes required:       lung CA-lung cancer       heart disease-father  Social History:    Still actively smoking   Risk Factors:  Tobacco use:  current    Cigarettes:  Yes --  1 1/2 ppd pack(s) per day   Review of Systems  The patient denies anorexia, fever, weight loss, weight gain, vision loss, decreased hearing, hoarseness, chest pain, syncope, peripheral edema, headaches, hemoptysis, abdominal pain, melena, hematochezia, severe indigestion/heartburn, hematuria, incontinence, muscle weakness, suspicious skin lesions, transient blindness, difficulty walking, depression, unusual weight change, abnormal bleeding, and enlarged lymph nodes.     Vital Signs:  Patient Profile:   55 Years Old Female Weight:      163.13 pounds O2 Sat:      94 % O2  treatment:    Room Air Temp:     98.2 degrees F oral Pulse rate:   85 / minute BP sitting:   104 / 66  (left arm) Cuff size:   regular  Vitals Entered By: Cyndia Diver LPN (April 28, 2008 10:01 AM)             Comments Medications reviewed with patient Cyndia Diver LPN  April 28, 2008 10:03 AM      Physical Exam  ambulatory middle-aged white female who appears older than stated age in no acute distress. HEENT mild turbinate edema.  Oropharynx no thrush or excess pnd or cobblestoning.  No JVD or cervical adenopathy. Mild accessory muscle hypertrophy. Trachea midline, nl thryroid.  Chest was hyperinflated by percussion with diminished breath sounds and moderate increased exp time without wheeze. Hoover sign positive at mid inspiration. Regular rate and rhythm without murmur gallop or rub or increase P2.  Abd: no hsm, nl excursion. Ext warm without C,C or E.     CXR  Procedure date:  04/28/2008  Findings:      diffuse mild increased bronchial markings     Problem # 1:  C O P D (ICD-496) her COPD was relatively mild in 2007 so it is very unlikely it has progressed significantly over just two years, despite her continued smoking.  What likely has progressed is the asthmatic and bronchitic components which have proven refractory to her present regimen and suggest the possibility of evolving sinusitis and bronchiectasis.  I spent extra time with the patient today explaining optimal mdi  technique.  This improved from  50-75%  Recommended she stop all of her present regimen and replace it with Symbicort 160/4.52 puffs b.i.d. with the goal of needing rescue therapy less than twice daily and put all of her effort into it limiting cigarettes long-term.  for apparent refractory purulent tracheobronchitis I recommended 10 days of Augmentin with a sinus CT scan if not improved. The following medications were removed from the medication list:    Advair Diskus 250-50 Mcg/dose Misc (Fluticasone-salmeterol) ..... Inhale 1 puffs two times a day    Pulmicort Flexhaler 180 Mcg/act Aepb (Budesonide) ..... Inhale 1 puff two times a day  Her updated medication list for this problem includes:    Symbicort 160-4.5 Mcg/act Aero (Budesonide-formoterol fumarate) .Marland Kitchen..Marland Kitchen Two puffs in am and 2 puffs in    Augmentin 875-125 Mg Tabs (Amoxicillin-pot clavulanate) ..... By mouth twice daily    Prednisone 10 Mg Tabs (Prednisone) .Marland KitchenMarland KitchenMarland KitchenMarland Kitchen 4 each am x 2days, 2x2days, 1x2days and stop    Albuterol Sulfate (2.5 Mg/71ml) 0.083% Nebu (Albuterol sulfate) .Marland Kitchen... As needed    Proair Hfa 108 (90 Base) Mcg/act  Aers (Albuterol sulfate) ..... Inhale 2 puffs every 4 to 6 hours as needed  Orders: T-2 View CXR, Same Day (71020.5TC) New Patient Level V (16109)   Medications Added to Medication List This Visit: 1)  Symbicort 160-4.5 Mcg/act Aero (Budesonide-formoterol fumarate) .... Two puffs in am and 2 puffs in 2)  Crestor 10 Mg Tabs (Rosuvastatin calcium) .... Take 1 tablet by mouth once a day 3)  Advair Diskus 250-50 Mcg/dose Misc (Fluticasone-salmeterol) .... Inhale 1 puffs two times a day 4)  Fexofenadine Hcl 180 Mg Tabs (Fexofenadine hcl) .... Take 1 tablet by mouth once a day 5)  Budeprion Sr 150 Mg Xr12h-tab (Bupropion hcl) .... Take 1 tablet by mouth once a day 6)  Augmentin 875-125 Mg Tabs (Amoxicillin-pot clavulanate) .... By mouth twice daily 7)  Pulmicort Flexhaler 180 Mcg/act Aepb (Budesonide) .... Inhale 1 puff two times a day 8)  Prednisone 10 Mg Tabs (Prednisone) .... 4 each am x 2days, 2x2days, 1x2days and stop 9)  Proair Hfa 108 (90 Base) Mcg/act Aers (Albuterol sulfate) .... Inhale 2 puffs every 4 to 6 hours as needed 10)  Mucinex Dm 30-600 Mg Xr12h-tab (Dextromethorphan-guaifenesin) .Marland Kitchen.. 1-2 every 12 hours for cough and or congestion  Complete Medication List: 1)  Symbicort 160-4.5 Mcg/act Aero (Budesonide-formoterol fumarate) .... Two puffs in am and 2 puffs in 2)  Bayer Aspirin 325 Mg Tabs (Aspirin) .... Take 1 tablet by mouth once a day 3)  Gabapentin 300 Mg Caps (Gabapentin) .... Take 1 capsule by mouth three times a day 4)  Premarin 0.625 Mg Tabs (Estrogens conjugated) .... Take 1 tablet by mouth once a day 5)  Lexapro 20 Mg Tabs (Escitalopram oxalate) .... Take 1 tablet by mouth once a day 6)  Diovan Hct 160-12.5 Mg Tabs (Valsartan-hydrochlorothiazide) .... Take 1 tablet by mouth once a day 7)  Crestor 10 Mg Tabs (Rosuvastatin calcium) .... Take 1 tablet by mouth once a day 8)  Fexofenadine Hcl 180 Mg Tabs (Fexofenadine hcl) .... Take 1 tablet by mouth once a day 9)   Budeprion Sr 150 Mg Xr12h-tab (Bupropion hcl) .... Take 1 tablet by mouth once a day 10)  Augmentin 875-125 Mg Tabs (Amoxicillin-pot clavulanate) .... By mouth twice daily 11)  Prednisone 10 Mg Tabs (Prednisone) .... 4 each am x 2days, 2x2days, 1x2days and stop 12)  Albuterol Sulfate (2.5 Mg/72ml) 0.083% Nebu (Albuterol sulfate) .... As needed 13)  Proair Hfa 108 (90 Base) Mcg/act Aers (Albuterol sulfate) .... Inhale 2 puffs every 4 to 6 hours as needed 14)  Mucinex Dm 30-600 Mg Xr12h-tab (Dextromethorphan-guaifenesin) .Marland Kitchen.. 1-2 every 12 hours for cough and or congestion   Patient Instructions: 1)  work on inhaler technique 2)  Please schedule a follow-up appointment in 1 month with PFT's   Prescriptions: PREDNISONE 10 MG  TABS (PREDNISONE) 4 each am x 2days, 2x2days, 1x2days and stop  #14 x 0   Entered and Authorized by:   Nyoka Cowden MD   Signed by:   Nyoka Cowden MD on 04/28/2008   Method used:   Electronically to        [facility] [id number] (retail)       [address]       [place], [place]  [address]       Ph: [phone] or [phone]       Fax: [phone]   RxID:   [id number] SYMBICORT 160-4.5 MCG/ACT  AERO (BUDESONIDE-FORMOTEROL FUMARATE) Two puffs in am and 2 puffs in  #1 x 11   Entered and Authorized by:   Nyoka Cowden MD   Signed by:   Nyoka Cowden MD on 04/28/2008   Method used:   Electronically to        [id number] (retail)       [address]       [place], [place]  [address]       Ph: [phone] or [phone]       Fax: [phone]   RxID:   [id number] AUGMENTIN 875-125 MG  TABS (AMOXICILLIN-POT CLAVULANATE) By mouth twice daily  #20 x 0   Entered and Authorized by:   Nyoka Cowden MD   Signed by:  Nyoka Cowden MD on 04/28/2008   Method used:   Electronically to        [id number] ([facility]       [address]       [place], [place]  [address]       Ph: [phone] or [phone]       Fax: [phone]   RxID:   [id number]  ]

## 2010-10-27 ENCOUNTER — Other Ambulatory Visit (HOSPITAL_COMMUNITY): Payer: Self-pay | Admitting: Interventional Radiology

## 2010-10-27 DIAGNOSIS — I729 Aneurysm of unspecified site: Secondary | ICD-10-CM

## 2010-11-05 ENCOUNTER — Ambulatory Visit (HOSPITAL_COMMUNITY)
Admission: RE | Admit: 2010-11-05 | Discharge: 2010-11-05 | Disposition: A | Discharge status: Home / Self Care | Payer: 59 | Source: Ambulatory Visit | Attending: Interventional Radiology | Admitting: Interventional Radiology

## 2010-11-05 ENCOUNTER — Other Ambulatory Visit (HOSPITAL_COMMUNITY): Payer: Self-pay | Admitting: Interventional Radiology

## 2010-11-05 DIAGNOSIS — E538 Deficiency of other specified B group vitamins: Secondary | ICD-10-CM | POA: Insufficient documentation

## 2010-11-05 DIAGNOSIS — I671 Cerebral aneurysm, nonruptured: Secondary | ICD-10-CM | POA: Insufficient documentation

## 2010-11-05 DIAGNOSIS — M545 Low back pain, unspecified: Secondary | ICD-10-CM | POA: Insufficient documentation

## 2010-11-05 DIAGNOSIS — J4489 Other specified chronic obstructive pulmonary disease: Secondary | ICD-10-CM | POA: Insufficient documentation

## 2010-11-05 DIAGNOSIS — Z87891 Personal history of nicotine dependence: Secondary | ICD-10-CM | POA: Insufficient documentation

## 2010-11-05 DIAGNOSIS — I10 Essential (primary) hypertension: Secondary | ICD-10-CM | POA: Insufficient documentation

## 2010-11-05 DIAGNOSIS — I729 Aneurysm of unspecified site: Secondary | ICD-10-CM

## 2010-11-05 DIAGNOSIS — F3289 Other specified depressive episodes: Secondary | ICD-10-CM | POA: Insufficient documentation

## 2010-11-05 DIAGNOSIS — G8929 Other chronic pain: Secondary | ICD-10-CM | POA: Insufficient documentation

## 2010-11-05 DIAGNOSIS — J449 Chronic obstructive pulmonary disease, unspecified: Secondary | ICD-10-CM | POA: Insufficient documentation

## 2010-11-05 DIAGNOSIS — F329 Major depressive disorder, single episode, unspecified: Secondary | ICD-10-CM | POA: Insufficient documentation

## 2010-11-05 DIAGNOSIS — Z8673 Personal history of transient ischemic attack (TIA), and cerebral infarction without residual deficits: Secondary | ICD-10-CM | POA: Insufficient documentation

## 2010-11-05 LAB — CBC
HCT: 38.2 % (ref 36.0–46.0)
Hemoglobin: 12.3 g/dL (ref 12.0–15.0)
MCH: 30.3 pg (ref 26.0–34.0)
MCHC: 32.2 g/dL (ref 30.0–36.0)
MCV: 94.1 fL (ref 78.0–100.0)
Platelets: 215 10*3/uL (ref 150–400)
RBC: 4.06 MIL/uL (ref 3.87–5.11)
RDW: 13.7 % (ref 11.5–15.5)
WBC: 8 10*3/uL (ref 4.0–10.5)

## 2010-11-05 LAB — PROTIME-INR
INR: 0.89 (ref 0.00–1.49)
Prothrombin Time: 12.2 seconds (ref 11.6–15.2)

## 2010-11-05 LAB — POCT I-STAT, CHEM 8
BUN: 22 mg/dL (ref 6–23)
Calcium, Ion: 1.17 mmol/L (ref 1.12–1.32)
Chloride: 106 mEq/L (ref 96–112)
Creatinine, Ser: 0.8 mg/dL (ref 0.4–1.2)
Glucose, Bld: 83 mg/dL (ref 70–99)
HCT: 38 % (ref 36.0–46.0)
Hemoglobin: 12.9 g/dL (ref 12.0–15.0)
Potassium: 4 mEq/L (ref 3.5–5.1)
Sodium: 141 mEq/L (ref 135–145)
TCO2: 25 mmol/L (ref 0–100)

## 2010-11-05 LAB — APTT: aPTT: 30 seconds (ref 24–37)

## 2010-11-05 MED ORDER — IOHEXOL 300 MG/ML  SOLN: 150.0000 mL | Freq: Once | INTRAMUSCULAR | Status: AC | PRN

## 2010-11-18 LAB — BASIC METABOLIC PANEL
BUN: 13 mg/dL (ref 6–23)
CO2: 27 mEq/L (ref 19–32)
Calcium: 9.6 mg/dL (ref 8.4–10.5)
Chloride: 108 mEq/L (ref 96–112)
Creatinine, Ser: 0.54 mg/dL (ref 0.4–1.2)
GFR calc Af Amer: >60 mL/min (ref >60)
GFR calc non Af Amer: >60 mL/min (ref >60)
Glucose, Bld: 88 mg/dL (ref 70–99)
Potassium: 4 mEq/L (ref 3.5–5.1)
Sodium: 141 mEq/L (ref 135–145)

## 2010-11-18 LAB — CBC
HCT: 38.2 % (ref 36.0–46.0)
Hemoglobin: 12.4 g/dL (ref 12.0–15.0)
MCH: 31 pg (ref 26.0–34.0)
MCHC: 32.5 g/dL (ref 30.0–36.0)
MCV: 95.5 fL (ref 78.0–100.0)
Platelets: 217 10*3/uL (ref 150–400)
RBC: 4 MIL/uL (ref 3.87–5.11)
RDW: 13.7 % (ref 11.5–15.5)
WBC: 8.4 10*3/uL (ref 4.0–10.5)

## 2010-11-18 LAB — PROTIME-INR
INR: 0.91 (ref 0.00–1.49)
Prothrombin Time: 12.5 seconds (ref 11.6–15.2)

## 2010-11-18 LAB — APTT: aPTT: 32 seconds (ref 24–37)

## 2010-11-23 LAB — URINALYSIS, ROUTINE W REFLEX MICROSCOPIC
Bilirubin Urine: NEGATIVE
Glucose, UA: NEGATIVE mg/dL
Ketones, ur: NEGATIVE mg/dL
Nitrite: NEGATIVE
Protein, ur: NEGATIVE mg/dL
Specific Gravity, Urine: 1.008 (ref 1.005–1.030)
Urobilinogen, UA: 0.2 mg/dL (ref 0.0–1.0)
pH: 6 (ref 5.0–8.0)

## 2010-11-23 LAB — URINE MICROSCOPIC-ADD ON

## 2010-11-24 LAB — GLUCOSE, CAPILLARY
Glucose-Capillary: 105 mg/dL — ABNORMAL HIGH (ref 70–99)
Glucose-Capillary: 105 mg/dL — ABNORMAL HIGH (ref 70–99)
Glucose-Capillary: 106 mg/dL — ABNORMAL HIGH (ref 70–99)
Glucose-Capillary: 108 mg/dL — ABNORMAL HIGH (ref 70–99)
Glucose-Capillary: 109 mg/dL — ABNORMAL HIGH (ref 70–99)
Glucose-Capillary: 114 mg/dL — ABNORMAL HIGH (ref 70–99)
Glucose-Capillary: 116 mg/dL — ABNORMAL HIGH (ref 70–99)
Glucose-Capillary: 117 mg/dL — ABNORMAL HIGH (ref 70–99)
Glucose-Capillary: 120 mg/dL — ABNORMAL HIGH (ref 70–99)
Glucose-Capillary: 124 mg/dL — ABNORMAL HIGH (ref 70–99)
Glucose-Capillary: 126 mg/dL — ABNORMAL HIGH (ref 70–99)
Glucose-Capillary: 133 mg/dL — ABNORMAL HIGH (ref 70–99)
Glucose-Capillary: 134 mg/dL — ABNORMAL HIGH (ref 70–99)
Glucose-Capillary: 135 mg/dL — ABNORMAL HIGH (ref 70–99)
Glucose-Capillary: 138 mg/dL — ABNORMAL HIGH (ref 70–99)
Glucose-Capillary: 141 mg/dL — ABNORMAL HIGH (ref 70–99)
Glucose-Capillary: 148 mg/dL — ABNORMAL HIGH (ref 70–99)
Glucose-Capillary: 149 mg/dL — ABNORMAL HIGH (ref 70–99)
Glucose-Capillary: 150 mg/dL — ABNORMAL HIGH (ref 70–99)
Glucose-Capillary: 153 mg/dL — ABNORMAL HIGH (ref 70–99)
Glucose-Capillary: 153 mg/dL — ABNORMAL HIGH (ref 70–99)
Glucose-Capillary: 154 mg/dL — ABNORMAL HIGH (ref 70–99)
Glucose-Capillary: 166 mg/dL — ABNORMAL HIGH (ref 70–99)
Glucose-Capillary: 171 mg/dL — ABNORMAL HIGH (ref 70–99)
Glucose-Capillary: 178 mg/dL — ABNORMAL HIGH (ref 70–99)
Glucose-Capillary: 184 mg/dL — ABNORMAL HIGH (ref 70–99)
Glucose-Capillary: 198 mg/dL — ABNORMAL HIGH (ref 70–99)
Glucose-Capillary: 203 mg/dL — ABNORMAL HIGH (ref 70–99)
Glucose-Capillary: 97 mg/dL (ref 70–99)

## 2010-11-24 LAB — BASIC METABOLIC PANEL
BUN: 17 mg/dL (ref 6–23)
BUN: 18 mg/dL (ref 6–23)
BUN: 20 mg/dL (ref 6–23)
CO2: 32 mEq/L (ref 19–32)
CO2: 33 mEq/L — ABNORMAL HIGH (ref 19–32)
CO2: 34 mEq/L — ABNORMAL HIGH (ref 19–32)
Calcium: 9.4 mg/dL (ref 8.4–10.5)
Calcium: 9.6 mg/dL (ref 8.4–10.5)
Calcium: 9.8 mg/dL (ref 8.4–10.5)
Chloride: 97 mEq/L (ref 96–112)
Chloride: 98 mEq/L (ref 96–112)
Chloride: 99 mEq/L (ref 96–112)
Creatinine, Ser: 0.53 mg/dL (ref 0.4–1.2)
Creatinine, Ser: 0.55 mg/dL (ref 0.4–1.2)
Creatinine, Ser: 0.56 mg/dL (ref 0.4–1.2)
GFR calc Af Amer: >60 mL/min (ref >60)
GFR calc Af Amer: >60 mL/min (ref >60)
GFR calc Af Amer: >60 mL/min (ref >60)
GFR calc non Af Amer: >60 mL/min (ref >60)
GFR calc non Af Amer: >60 mL/min (ref >60)
GFR calc non Af Amer: >60 mL/min (ref >60)
Glucose, Bld: 110 mg/dL — ABNORMAL HIGH (ref 70–99)
Glucose, Bld: 115 mg/dL — ABNORMAL HIGH (ref 70–99)
Glucose, Bld: 116 mg/dL — ABNORMAL HIGH (ref 70–99)
Potassium: 3.4 mEq/L — ABNORMAL LOW (ref 3.5–5.1)
Potassium: 3.5 mEq/L (ref 3.5–5.1)
Potassium: 4.1 mEq/L (ref 3.5–5.1)
Sodium: 139 mEq/L (ref 135–145)
Sodium: 140 mEq/L (ref 135–145)
Sodium: 142 mEq/L (ref 135–145)

## 2010-11-24 LAB — CBC
HCT: 41.7 % (ref 36.0–46.0)
HCT: 42.9 % (ref 36.0–46.0)
HCT: 44.5 % (ref 36.0–46.0)
Hemoglobin: 13.9 g/dL (ref 12.0–15.0)
Hemoglobin: 14 g/dL (ref 12.0–15.0)
Hemoglobin: 14.9 g/dL (ref 12.0–15.0)
MCHC: 32.7 g/dL (ref 30.0–36.0)
MCHC: 33.3 g/dL (ref 30.0–36.0)
MCHC: 33.4 g/dL (ref 30.0–36.0)
MCV: 102.4 fL — ABNORMAL HIGH (ref 78.0–100.0)
MCV: 102.6 fL — ABNORMAL HIGH (ref 78.0–100.0)
MCV: 102.8 fL — ABNORMAL HIGH (ref 78.0–100.0)
Platelets: 190 10*3/uL (ref 150–400)
Platelets: 207 10*3/uL (ref 150–400)
Platelets: 208 10*3/uL (ref 150–400)
RBC: 4.05 MIL/uL (ref 3.87–5.11)
RBC: 4.18 MIL/uL (ref 3.87–5.11)
RBC: 4.34 MIL/uL (ref 3.87–5.11)
RDW: 14.9 % (ref 11.5–15.5)
RDW: 15.1 % (ref 11.5–15.5)
RDW: 15.1 % (ref 11.5–15.5)
WBC: 10.9 10*3/uL — ABNORMAL HIGH (ref 4.0–10.5)
WBC: 11.7 10*3/uL — ABNORMAL HIGH (ref 4.0–10.5)
WBC: 13.1 10*3/uL — ABNORMAL HIGH (ref 4.0–10.5)

## 2010-11-24 LAB — PHENYTOIN LEVEL, TOTAL: Phenytoin Lvl: 8.2 ug/mL — ABNORMAL LOW (ref 10.0–20.0)

## 2010-11-24 LAB — MAGNESIUM: Magnesium: 2.3 mg/dL (ref 1.5–2.5)

## 2010-11-24 LAB — PHOSPHORUS: Phosphorus: 5.2 mg/dL — ABNORMAL HIGH (ref 2.3–4.6)

## 2010-11-29 LAB — CBC
HCT: 32.5 % — ABNORMAL LOW (ref 36.0–46.0)
HCT: 32.5 % — ABNORMAL LOW (ref 36.0–46.0)
HCT: 35.9 % — ABNORMAL LOW (ref 36.0–46.0)
HCT: 36.1 % (ref 36.0–46.0)
HCT: 36.8 % (ref 36.0–46.0)
HCT: 38.7 % (ref 36.0–46.0)
HCT: 39.3 % (ref 36.0–46.0)
HCT: 42.6 % (ref 36.0–46.0)
HCT: 43.9 % (ref 36.0–46.0)
Hemoglobin: 11 g/dL — ABNORMAL LOW (ref 12.0–15.0)
Hemoglobin: 11.1 g/dL — ABNORMAL LOW (ref 12.0–15.0)
Hemoglobin: 11.9 g/dL — ABNORMAL LOW (ref 12.0–15.0)
Hemoglobin: 12.3 g/dL (ref 12.0–15.0)
Hemoglobin: 12.5 g/dL (ref 12.0–15.0)
Hemoglobin: 13.1 g/dL (ref 12.0–15.0)
Hemoglobin: 13.1 g/dL (ref 12.0–15.0)
Hemoglobin: 14.1 g/dL (ref 12.0–15.0)
Hemoglobin: 14.7 g/dL (ref 12.0–15.0)
MCHC: 33 g/dL (ref 30.0–36.0)
MCHC: 33.2 g/dL (ref 30.0–36.0)
MCHC: 33.4 g/dL (ref 30.0–36.0)
MCHC: 33.6 g/dL (ref 30.0–36.0)
MCHC: 33.8 g/dL (ref 30.0–36.0)
MCHC: 34 g/dL (ref 30.0–36.0)
MCHC: 34 g/dL (ref 30.0–36.0)
MCHC: 34 g/dL (ref 30.0–36.0)
MCHC: 34.2 g/dL (ref 30.0–36.0)
MCV: 101.3 fL — ABNORMAL HIGH (ref 78.0–100.0)
MCV: 101.7 fL — ABNORMAL HIGH (ref 78.0–100.0)
MCV: 101.8 fL — ABNORMAL HIGH (ref 78.0–100.0)
MCV: 101.8 fL — ABNORMAL HIGH (ref 78.0–100.0)
MCV: 102.4 fL — ABNORMAL HIGH (ref 78.0–100.0)
MCV: 102.4 fL — ABNORMAL HIGH (ref 78.0–100.0)
MCV: 102.8 fL — ABNORMAL HIGH (ref 78.0–100.0)
MCV: 103 fL — ABNORMAL HIGH (ref 78.0–100.0)
MCV: 94.5 fL (ref 78.0–100.0)
Platelets: 119 10*3/uL — ABNORMAL LOW (ref 150–400)
Platelets: 124 10*3/uL — ABNORMAL LOW (ref 150–400)
Platelets: 144 10*3/uL — ABNORMAL LOW (ref 150–400)
Platelets: 145 10*3/uL — ABNORMAL LOW (ref 150–400)
Platelets: 151 10*3/uL (ref 150–400)
Platelets: 157 10*3/uL (ref 150–400)
Platelets: 164 10*3/uL (ref 150–400)
Platelets: 180 10*3/uL (ref 150–400)
Platelets: 326 10*3/uL (ref 150–400)
RBC: 3.19 MIL/uL — ABNORMAL LOW (ref 3.87–5.11)
RBC: 3.2 MIL/uL — ABNORMAL LOW (ref 3.87–5.11)
RBC: 3.52 MIL/uL — ABNORMAL LOW (ref 3.87–5.11)
RBC: 3.53 MIL/uL — ABNORMAL LOW (ref 3.87–5.11)
RBC: 3.57 MIL/uL — ABNORMAL LOW (ref 3.87–5.11)
RBC: 3.81 MIL/uL — ABNORMAL LOW (ref 3.87–5.11)
RBC: 3.82 MIL/uL — ABNORMAL LOW (ref 3.87–5.11)
RBC: 4.28 MIL/uL (ref 3.87–5.11)
RBC: 4.5 MIL/uL (ref 3.87–5.11)
RDW: 14.9 % (ref 11.5–15.5)
RDW: 15 % (ref 11.5–15.5)
RDW: 15 % (ref 11.5–15.5)
RDW: 15.1 % (ref 11.5–15.5)
RDW: 15.2 % (ref 11.5–15.5)
RDW: 15.3 % (ref 11.5–15.5)
RDW: 15.3 % (ref 11.5–15.5)
RDW: 15.3 % (ref 11.5–15.5)
RDW: 16 % — ABNORMAL HIGH (ref 11.5–15.5)
WBC: 10.3 10*3/uL (ref 4.0–10.5)
WBC: 10.6 10*3/uL — ABNORMAL HIGH (ref 4.0–10.5)
WBC: 34.6 10*3/uL — ABNORMAL HIGH (ref 4.0–10.5)
WBC: 6.1 10*3/uL (ref 4.0–10.5)
WBC: 6.3 10*3/uL (ref 4.0–10.5)
WBC: 7.8 10*3/uL (ref 4.0–10.5)
WBC: 8.5 10*3/uL (ref 4.0–10.5)
WBC: 8.8 10*3/uL (ref 4.0–10.5)
WBC: 9.4 10*3/uL (ref 4.0–10.5)

## 2010-11-29 LAB — COMPREHENSIVE METABOLIC PANEL
ALT: 12 U/L (ref 0–35)
ALT: 24 U/L (ref 0–35)
AST: 17 U/L (ref 0–37)
AST: 25 U/L (ref 0–37)
Albumin: 2.8 g/dL — ABNORMAL LOW (ref 3.5–5.2)
Albumin: 3.2 g/dL — ABNORMAL LOW (ref 3.5–5.2)
Alkaline Phosphatase: 59 U/L (ref 39–117)
Alkaline Phosphatase: 65 U/L (ref 39–117)
BUN: 18 mg/dL (ref 6–23)
BUN: 21 mg/dL (ref 6–23)
CO2: 28 mEq/L (ref 19–32)
CO2: 31 mEq/L (ref 19–32)
Calcium: 8.4 mg/dL (ref 8.4–10.5)
Calcium: 8.8 mg/dL (ref 8.4–10.5)
Chloride: 102 mEq/L (ref 96–112)
Chloride: 102 mEq/L (ref 96–112)
Creatinine, Ser: 0.5 mg/dL (ref 0.4–1.2)
Creatinine, Ser: 0.69 mg/dL (ref 0.4–1.2)
GFR calc Af Amer: >60 mL/min (ref >60)
GFR calc Af Amer: >60 mL/min (ref >60)
GFR calc non Af Amer: >60 mL/min (ref >60)
GFR calc non Af Amer: >60 mL/min (ref >60)
Glucose, Bld: 104 mg/dL — ABNORMAL HIGH (ref 70–99)
Glucose, Bld: 99 mg/dL (ref 70–99)
Potassium: 3.2 mEq/L — ABNORMAL LOW (ref 3.5–5.1)
Potassium: 3.6 mEq/L (ref 3.5–5.1)
Sodium: 140 mEq/L (ref 135–145)
Sodium: 142 mEq/L (ref 135–145)
Total Bilirubin: 0.2 mg/dL — ABNORMAL LOW (ref 0.3–1.2)
Total Bilirubin: 0.5 mg/dL (ref 0.3–1.2)
Total Protein: 6 g/dL (ref 6.0–8.3)
Total Protein: 6.2 g/dL (ref 6.0–8.3)

## 2010-11-29 LAB — MAGNESIUM
Magnesium: 1.7 mg/dL (ref 1.5–2.5)
Magnesium: 1.9 mg/dL (ref 1.5–2.5)
Magnesium: 1.9 mg/dL (ref 1.5–2.5)
Magnesium: 2.1 mg/dL (ref 1.5–2.5)

## 2010-11-29 LAB — CSF CELL COUNT WITH DIFFERENTIAL
RBC Count, CSF: 2 /mm3 — ABNORMAL HIGH
RBC Count, CSF: 3 /mm3 — ABNORMAL HIGH
Tube #: 1
Tube #: 4
WBC, CSF: 2 /mm3 (ref 0–5)
WBC, CSF: 3 /mm3 (ref 0–5)

## 2010-11-29 LAB — GLUCOSE, CAPILLARY
Glucose-Capillary: 100 mg/dL — ABNORMAL HIGH (ref 70–99)
Glucose-Capillary: 103 mg/dL — ABNORMAL HIGH (ref 70–99)
Glucose-Capillary: 103 mg/dL — ABNORMAL HIGH (ref 70–99)
Glucose-Capillary: 106 mg/dL — ABNORMAL HIGH (ref 70–99)
Glucose-Capillary: 106 mg/dL — ABNORMAL HIGH (ref 70–99)
Glucose-Capillary: 106 mg/dL — ABNORMAL HIGH (ref 70–99)
Glucose-Capillary: 109 mg/dL — ABNORMAL HIGH (ref 70–99)
Glucose-Capillary: 110 mg/dL — ABNORMAL HIGH (ref 70–99)
Glucose-Capillary: 111 mg/dL — ABNORMAL HIGH (ref 70–99)
Glucose-Capillary: 111 mg/dL — ABNORMAL HIGH (ref 70–99)
Glucose-Capillary: 112 mg/dL — ABNORMAL HIGH (ref 70–99)
Glucose-Capillary: 112 mg/dL — ABNORMAL HIGH (ref 70–99)
Glucose-Capillary: 113 mg/dL — ABNORMAL HIGH (ref 70–99)
Glucose-Capillary: 114 mg/dL — ABNORMAL HIGH (ref 70–99)
Glucose-Capillary: 115 mg/dL — ABNORMAL HIGH (ref 70–99)
Glucose-Capillary: 116 mg/dL — ABNORMAL HIGH (ref 70–99)
Glucose-Capillary: 119 mg/dL — ABNORMAL HIGH (ref 70–99)
Glucose-Capillary: 119 mg/dL — ABNORMAL HIGH (ref 70–99)
Glucose-Capillary: 121 mg/dL — ABNORMAL HIGH (ref 70–99)
Glucose-Capillary: 123 mg/dL — ABNORMAL HIGH (ref 70–99)
Glucose-Capillary: 124 mg/dL — ABNORMAL HIGH (ref 70–99)
Glucose-Capillary: 126 mg/dL — ABNORMAL HIGH (ref 70–99)
Glucose-Capillary: 130 mg/dL — ABNORMAL HIGH (ref 70–99)
Glucose-Capillary: 133 mg/dL — ABNORMAL HIGH (ref 70–99)
Glucose-Capillary: 133 mg/dL — ABNORMAL HIGH (ref 70–99)
Glucose-Capillary: 133 mg/dL — ABNORMAL HIGH (ref 70–99)
Glucose-Capillary: 135 mg/dL — ABNORMAL HIGH (ref 70–99)
Glucose-Capillary: 135 mg/dL — ABNORMAL HIGH (ref 70–99)
Glucose-Capillary: 138 mg/dL — ABNORMAL HIGH (ref 70–99)
Glucose-Capillary: 139 mg/dL — ABNORMAL HIGH (ref 70–99)
Glucose-Capillary: 144 mg/dL — ABNORMAL HIGH (ref 70–99)
Glucose-Capillary: 152 mg/dL — ABNORMAL HIGH (ref 70–99)
Glucose-Capillary: 153 mg/dL — ABNORMAL HIGH (ref 70–99)
Glucose-Capillary: 154 mg/dL — ABNORMAL HIGH (ref 70–99)
Glucose-Capillary: 156 mg/dL — ABNORMAL HIGH (ref 70–99)
Glucose-Capillary: 160 mg/dL — ABNORMAL HIGH (ref 70–99)
Glucose-Capillary: 161 mg/dL — ABNORMAL HIGH (ref 70–99)
Glucose-Capillary: 169 mg/dL — ABNORMAL HIGH (ref 70–99)
Glucose-Capillary: 170 mg/dL — ABNORMAL HIGH (ref 70–99)
Glucose-Capillary: 170 mg/dL — ABNORMAL HIGH (ref 70–99)
Glucose-Capillary: 175 mg/dL — ABNORMAL HIGH (ref 70–99)
Glucose-Capillary: 198 mg/dL — ABNORMAL HIGH (ref 70–99)
Glucose-Capillary: 85 mg/dL (ref 70–99)
Glucose-Capillary: 86 mg/dL (ref 70–99)
Glucose-Capillary: 90 mg/dL (ref 70–99)
Glucose-Capillary: 94 mg/dL (ref 70–99)
Glucose-Capillary: 96 mg/dL (ref 70–99)
Glucose-Capillary: 97 mg/dL (ref 70–99)
Glucose-Capillary: 98 mg/dL (ref 70–99)

## 2010-11-29 LAB — BLOOD GAS, ARTERIAL
Acid-Base Excess: 0.4 mmol/L (ref 0.0–2.0)
Acid-Base Excess: 1.5 mmol/L (ref 0.0–2.0)
Acid-Base Excess: 3.8 mmol/L — ABNORMAL HIGH (ref 0.0–2.0)
Acid-Base Excess: 5 mmol/L — ABNORMAL HIGH (ref 0.0–2.0)
Bicarbonate: 24.5 mEq/L — ABNORMAL HIGH (ref 20.0–24.0)
Bicarbonate: 24.5 mEq/L — ABNORMAL HIGH (ref 20.0–24.0)
Bicarbonate: 28.3 mEq/L — ABNORMAL HIGH (ref 20.0–24.0)
Bicarbonate: 29.2 mEq/L — ABNORMAL HIGH (ref 20.0–24.0)
Drawn by: 312971
Drawn by: 319961
Drawn by: 32677
FIO2: 0.4 %
FIO2: 0.4 %
FIO2: 30 %
MECHVT: 500 mL
Mode: POSITIVE
Mode: POSITIVE
O2 Content: 2 L/min
O2 Saturation: 94.9 %
O2 Saturation: 95.7 %
O2 Saturation: 95.8 %
O2 Saturation: 98.6 %
PEEP: 5 cmH2O
PEEP: 5 cmH2O
PEEP: 5 cmH2O
Patient temperature: 98.6
Patient temperature: 98.6
Patient temperature: 98.6
Patient temperature: 98.9
Pressure support: 10 cmH2O
Pressure support: 5 cmH2O
RATE: 18 resp/min
TCO2: 25.5 mmol/L (ref 0–100)
TCO2: 25.7 mmol/L (ref 0–100)
TCO2: 29.8 mmol/L (ref 0–100)
TCO2: 30.6 mmol/L (ref 0–100)
pCO2 arterial: 31.9 mmHg — ABNORMAL LOW (ref 35.0–45.0)
pCO2 arterial: 39.4 mmHg (ref 35.0–45.0)
pCO2 arterial: 45.6 mmHg — ABNORMAL HIGH (ref 35.0–45.0)
pCO2 arterial: 46.9 mmHg — ABNORMAL HIGH (ref 35.0–45.0)
pH, Arterial: 7.398 (ref 7.350–7.400)
pH, Arterial: 7.41 — ABNORMAL HIGH (ref 7.350–7.400)
pH, Arterial: 7.423 — ABNORMAL HIGH (ref 7.350–7.400)
pH, Arterial: 7.498 — ABNORMAL HIGH (ref 7.350–7.400)
pO2, Arterial: 103 mmHg — ABNORMAL HIGH (ref 80.0–100.0)
pO2, Arterial: 71 mmHg — ABNORMAL LOW (ref 80.0–100.0)
pO2, Arterial: 82.1 mmHg (ref 80.0–100.0)
pO2, Arterial: 93.6 mmHg (ref 80.0–100.0)

## 2010-11-29 LAB — HEPATIC FUNCTION PANEL
ALT: 13 U/L (ref 0–35)
AST: 19 U/L (ref 0–37)
Albumin: 3.1 g/dL — ABNORMAL LOW (ref 3.5–5.2)
Alkaline Phosphatase: 67 U/L (ref 39–117)
Bilirubin, Direct: 0.1 mg/dL (ref 0.0–0.3)
Indirect Bilirubin: 0.6 mg/dL (ref 0.3–0.9)
Total Bilirubin: 0.7 mg/dL (ref 0.3–1.2)
Total Protein: 6.7 g/dL (ref 6.0–8.3)

## 2010-11-29 LAB — URINALYSIS, ROUTINE W REFLEX MICROSCOPIC
Bilirubin Urine: NEGATIVE
Glucose, UA: NEGATIVE mg/dL
Glucose, UA: NEGATIVE mg/dL
Hgb urine dipstick: NEGATIVE
Ketones, ur: 40 mg/dL — AB
Ketones, ur: NEGATIVE mg/dL
Leukocytes, UA: NEGATIVE
Nitrite: NEGATIVE
Nitrite: NEGATIVE
Protein, ur: 30 mg/dL — AB
Protein, ur: NEGATIVE mg/dL
Specific Gravity, Urine: 1.012 (ref 1.005–1.030)
Specific Gravity, Urine: 1.027 (ref 1.005–1.030)
Urobilinogen, UA: 0.2 mg/dL (ref 0.0–1.0)
Urobilinogen, UA: 0.2 mg/dL (ref 0.0–1.0)
pH: 5 (ref 5.0–8.0)
pH: 7.5 (ref 5.0–8.0)

## 2010-11-29 LAB — BASIC METABOLIC PANEL
BUN: 11 mg/dL (ref 6–23)
BUN: 11 mg/dL (ref 6–23)
BUN: 13 mg/dL (ref 6–23)
BUN: 17 mg/dL (ref 6–23)
BUN: 8 mg/dL (ref 6–23)
BUN: 8 mg/dL (ref 6–23)
CO2: 25 mEq/L (ref 19–32)
CO2: 25 mEq/L (ref 19–32)
CO2: 27 mEq/L (ref 19–32)
CO2: 27 mEq/L (ref 19–32)
CO2: 28 mEq/L (ref 19–32)
CO2: 29 mEq/L (ref 19–32)
Calcium: 7.9 mg/dL — ABNORMAL LOW (ref 8.4–10.5)
Calcium: 8 mg/dL — ABNORMAL LOW (ref 8.4–10.5)
Calcium: 8.2 mg/dL — ABNORMAL LOW (ref 8.4–10.5)
Calcium: 8.2 mg/dL — ABNORMAL LOW (ref 8.4–10.5)
Calcium: 8.2 mg/dL — ABNORMAL LOW (ref 8.4–10.5)
Calcium: 8.9 mg/dL (ref 8.4–10.5)
Chloride: 104 mEq/L (ref 96–112)
Chloride: 106 mEq/L (ref 96–112)
Chloride: 106 mEq/L (ref 96–112)
Chloride: 107 mEq/L (ref 96–112)
Chloride: 107 mEq/L (ref 96–112)
Chloride: 99 mEq/L (ref 96–112)
Creatinine, Ser: 0.38 mg/dL — ABNORMAL LOW (ref 0.4–1.2)
Creatinine, Ser: 0.44 mg/dL (ref 0.4–1.2)
Creatinine, Ser: 0.46 mg/dL (ref 0.4–1.2)
Creatinine, Ser: 0.48 mg/dL (ref 0.4–1.2)
Creatinine, Ser: 0.51 mg/dL (ref 0.4–1.2)
Creatinine, Ser: 0.68 mg/dL (ref 0.4–1.2)
GFR calc Af Amer: >60 mL/min (ref >60)
GFR calc Af Amer: >60 mL/min (ref >60)
GFR calc Af Amer: >60 mL/min (ref >60)
GFR calc Af Amer: >60 mL/min (ref >60)
GFR calc Af Amer: >60 mL/min (ref >60)
GFR calc Af Amer: >60 mL/min (ref >60)
GFR calc non Af Amer: >60 mL/min (ref >60)
GFR calc non Af Amer: >60 mL/min (ref >60)
GFR calc non Af Amer: >60 mL/min (ref >60)
GFR calc non Af Amer: >60 mL/min (ref >60)
GFR calc non Af Amer: >60 mL/min (ref >60)
GFR calc non Af Amer: >60 mL/min (ref >60)
Glucose, Bld: 122 mg/dL — ABNORMAL HIGH (ref 70–99)
Glucose, Bld: 129 mg/dL — ABNORMAL HIGH (ref 70–99)
Glucose, Bld: 142 mg/dL — ABNORMAL HIGH (ref 70–99)
Glucose, Bld: 144 mg/dL — ABNORMAL HIGH (ref 70–99)
Glucose, Bld: 97 mg/dL (ref 70–99)
Glucose, Bld: 97 mg/dL (ref 70–99)
Potassium: 2.6 mEq/L — CL (ref 3.5–5.1)
Potassium: 2.7 mEq/L — CL (ref 3.5–5.1)
Potassium: 3 mEq/L — ABNORMAL LOW (ref 3.5–5.1)
Potassium: 3 mEq/L — ABNORMAL LOW (ref 3.5–5.1)
Potassium: 3.7 mEq/L (ref 3.5–5.1)
Potassium: 4 mEq/L (ref 3.5–5.1)
Sodium: 135 mEq/L (ref 135–145)
Sodium: 137 mEq/L (ref 135–145)
Sodium: 137 mEq/L (ref 135–145)
Sodium: 139 mEq/L (ref 135–145)
Sodium: 142 mEq/L (ref 135–145)
Sodium: 142 mEq/L (ref 135–145)

## 2010-11-29 LAB — CULTURE, RESPIRATORY W GRAM STAIN

## 2010-11-29 LAB — LIPID PANEL
Cholesterol: 166 mg/dL (ref 0–200)
HDL: 60 mg/dL (ref >39)
LDL Cholesterol: 91 mg/dL (ref 0–99)
Total CHOL/HDL Ratio: 2.8 RATIO
Triglycerides: 76 mg/dL (ref <150)
VLDL: 15 mg/dL (ref 0–40)

## 2010-11-29 LAB — POCT CARDIAC MARKERS
CKMB, poc: <1 ng/mL — ABNORMAL LOW (ref 1.0–8.0)
Myoglobin, poc: 50.2 ng/mL (ref 12–200)
Troponin i, poc: <0.05 ng/mL (ref 0.00–0.09)

## 2010-11-29 LAB — POCT I-STAT 3, ART BLOOD GAS (G3+)
Acid-Base Excess: 3 mmol/L — ABNORMAL HIGH (ref 0.0–2.0)
Acid-Base Excess: 7 mmol/L — ABNORMAL HIGH (ref 0.0–2.0)
Bicarbonate: 29 meq/L — ABNORMAL HIGH (ref 20.0–24.0)
Bicarbonate: 36 mEq/L — ABNORMAL HIGH (ref 20.0–24.0)
O2 Saturation: 90 %
O2 Saturation: 92 %
Patient temperature: 37
TCO2: 30 mmol/L (ref 0–100)
TCO2: 38 mmol/L (ref 0–100)
pCO2 arterial: 47.3 mmHg — ABNORMAL HIGH (ref 35.0–45.0)
pCO2 arterial: 67.5 mmHg (ref 35.0–45.0)
pH, Arterial: 7.335 — ABNORMAL LOW (ref 7.350–7.400)
pH, Arterial: 7.396 (ref 7.350–7.400)
pO2, Arterial: 64 mmHg — ABNORMAL LOW (ref 80.0–100.0)
pO2, Arterial: 65 mmHg — ABNORMAL LOW (ref 80.0–100.0)

## 2010-11-29 LAB — CULTURE, BLOOD (ROUTINE X 2)
Culture: NO GROWTH
Culture: NO GROWTH

## 2010-11-29 LAB — DIFFERENTIAL
Basophils Absolute: 0 10*3/uL (ref 0.0–0.1)
Basophils Absolute: 0.1 K/uL (ref 0.0–0.1)
Basophils Relative: 0 % (ref 0–1)
Basophils Relative: 1 % (ref 0–1)
Eosinophils Absolute: 0 10*3/uL (ref 0.0–0.7)
Eosinophils Absolute: 0 K/uL (ref 0.0–0.7)
Eosinophils Relative: 0 % (ref 0–5)
Eosinophils Relative: 0 % (ref 0–5)
Lymphocytes Relative: 2 % — ABNORMAL LOW (ref 12–46)
Lymphocytes Relative: 26 % (ref 12–46)
Lymphs Abs: 0.7 K/uL (ref 0.7–4.0)
Lymphs Abs: 2.6 10*3/uL (ref 0.7–4.0)
Monocytes Absolute: 0.6 10*3/uL (ref 0.1–1.0)
Monocytes Absolute: 0.8 K/uL (ref 0.1–1.0)
Monocytes Relative: 2 % — ABNORMAL LOW (ref 3–12)
Monocytes Relative: 6 % (ref 3–12)
Neutro Abs: 33 K/uL — ABNORMAL HIGH (ref 1.7–7.7)
Neutro Abs: 7 10*3/uL (ref 1.7–7.7)
Neutrophils Relative %: 68 % (ref 43–77)
Neutrophils Relative %: 95 % — ABNORMAL HIGH (ref 43–77)

## 2010-11-29 LAB — RAPID URINE DRUG SCREEN, HOSP PERFORMED
Amphetamines: NOT DETECTED
Amphetamines: NOT DETECTED
Barbiturates: NOT DETECTED
Barbiturates: NOT DETECTED
Benzodiazepines: NOT DETECTED
Benzodiazepines: NOT DETECTED
Cocaine: NOT DETECTED
Cocaine: NOT DETECTED
Opiates: NOT DETECTED
Opiates: POSITIVE — AB
Tetrahydrocannabinol: NOT DETECTED
Tetrahydrocannabinol: NOT DETECTED

## 2010-11-29 LAB — BASIC METABOLIC PANEL WITH GFR
BUN: 21 mg/dL (ref 6–23)
CO2: 28 meq/L (ref 19–32)
Calcium: 8.3 mg/dL — ABNORMAL LOW (ref 8.4–10.5)
Chloride: 103 meq/L (ref 96–112)
Creatinine, Ser: 0.88 mg/dL (ref 0.4–1.2)
GFR calc Af Amer: >60 mL/min (ref >60)
GFR calc non Af Amer: >60 mL/min (ref >60)
Glucose, Bld: 93 mg/dL (ref 70–99)
Potassium: 2.9 meq/L — ABNORMAL LOW (ref 3.5–5.1)
Sodium: 141 meq/L (ref 135–145)

## 2010-11-29 LAB — BODY FLUID CULTURE: Culture: NO GROWTH

## 2010-11-29 LAB — FOLATE RBC: RBC Folate: 1747 ng/mL — ABNORMAL HIGH (ref 180–600)

## 2010-11-29 LAB — AMMONIA: Ammonia: 22 umol/L (ref 11–35)

## 2010-11-29 LAB — PROTEIN AND GLUCOSE, CSF
Glucose, CSF: 67 mg/dL (ref 43–76)
Total  Protein, CSF: 53 mg/dL — ABNORMAL HIGH (ref 15–45)

## 2010-11-29 LAB — GRAM STAIN

## 2010-11-29 LAB — CARDIAC PANEL(CRET KIN+CKTOT+MB+TROPI)
CK, MB: 0.8 ng/mL (ref 0.3–4.0)
CK, MB: 0.9 ng/mL (ref 0.3–4.0)
Relative Index: INVALID (ref 0.0–2.5)
Relative Index: INVALID (ref 0.0–2.5)
Total CK: 35 U/L (ref 7–177)
Total CK: 37 U/L (ref 7–177)
Troponin I: 0.04 ng/mL (ref 0.00–0.06)
Troponin I: <0.01 ng/mL (ref 0.00–0.06)

## 2010-11-29 LAB — TROPONIN I: Troponin I: 0.01 ng/mL (ref 0.00–0.06)

## 2010-11-29 LAB — URINE CULTURE
Colony Count: NO GROWTH
Culture: NO GROWTH

## 2010-11-29 LAB — ETHANOL: Alcohol, Ethyl (B): <5 mg/dL (ref 0–10)

## 2010-11-29 LAB — TSH
TSH: 0.662 u[IU]/mL (ref 0.350–4.500)
TSH: 1.404 u[IU]/mL (ref 0.350–4.500)

## 2010-11-29 LAB — PROTIME-INR
INR: 0.89 (ref 0.00–1.49)
Prothrombin Time: 12 seconds (ref 11.6–15.2)

## 2010-11-29 LAB — URINE MICROSCOPIC-ADD ON

## 2010-11-29 LAB — PHENYTOIN LEVEL, TOTAL
Phenytoin Lvl: 14.8 ug/mL (ref 10.0–20.0)
Phenytoin Lvl: 15.1 ug/mL (ref 10.0–20.0)

## 2010-11-29 LAB — MRSA PCR SCREENING
MRSA by PCR: NEGATIVE
MRSA by PCR: NEGATIVE
MRSA by PCR: NEGATIVE

## 2010-11-29 LAB — CK TOTAL AND CKMB (NOT AT ARMC)
CK, MB: 0.9 ng/mL (ref 0.3–4.0)
Relative Index: INVALID (ref 0.0–2.5)
Total CK: 35 U/L (ref 7–177)

## 2010-11-29 LAB — VITAMIN B12: Vitamin B-12: 660 pg/mL (ref 211–911)

## 2010-11-29 LAB — CULTURE, RESPIRATORY

## 2010-11-29 LAB — RPR: RPR Ser Ql: NONREACTIVE

## 2010-11-29 LAB — BRAIN NATRIURETIC PEPTIDE: Pro B Natriuretic peptide (BNP): 75 pg/mL (ref 0.0–100.0)

## 2010-11-29 LAB — PHOSPHORUS
Phosphorus: 2.9 mg/dL (ref 2.3–4.6)
Phosphorus: 3.3 mg/dL (ref 2.3–4.6)
Phosphorus: 3.8 mg/dL (ref 2.3–4.6)

## 2010-11-29 LAB — LACTIC ACID, PLASMA: Lactic Acid, Venous: 2.5 mmol/L — ABNORMAL HIGH (ref 0.5–2.2)

## 2010-11-29 LAB — ACETAMINOPHEN LEVEL: Acetaminophen (Tylenol), Serum: <10 ug/mL — ABNORMAL LOW (ref 10–30)

## 2010-11-29 LAB — CK: Total CK: 40 U/L (ref 7–177)

## 2010-11-29 LAB — APTT: aPTT: 24 seconds (ref 24–37)

## 2010-12-14 LAB — POCT I-STAT, CHEM 8
BUN: 29 mg/dL — ABNORMAL HIGH (ref 6–23)
Calcium, Ion: 0.97 mmol/L — ABNORMAL LOW (ref 1.12–1.32)
Chloride: 112 mEq/L (ref 96–112)
Creatinine, Ser: 1 mg/dL (ref 0.4–1.2)
Glucose, Bld: 82 mg/dL (ref 70–99)
HCT: 41 % (ref 36.0–46.0)
Hemoglobin: 13.9 g/dL (ref 12.0–15.0)
Potassium: 4.5 mEq/L (ref 3.5–5.1)
Sodium: 143 mEq/L (ref 135–145)
TCO2: 27 mmol/L (ref 0–100)

## 2010-12-14 LAB — DIFFERENTIAL
Basophils Absolute: 0 10*3/uL (ref 0.0–0.1)
Basophils Relative: 0 % (ref 0–1)
Eosinophils Absolute: 0.2 10*3/uL (ref 0.0–0.7)
Eosinophils Relative: 3 % (ref 0–5)
Lymphocytes Relative: 37 % (ref 12–46)
Lymphs Abs: 3.2 10*3/uL (ref 0.7–4.0)
Monocytes Absolute: 0.7 10*3/uL (ref 0.1–1.0)
Monocytes Relative: 8 % (ref 3–12)
Neutro Abs: 4.3 10*3/uL (ref 1.7–7.7)
Neutrophils Relative %: 51 % (ref 43–77)

## 2010-12-14 LAB — CBC
HCT: 40.2 % (ref 36.0–46.0)
Hemoglobin: 13.6 g/dL (ref 12.0–15.0)
MCHC: 33.9 g/dL (ref 30.0–36.0)
MCV: 99.3 fL (ref 78.0–100.0)
Platelets: 209 10*3/uL (ref 150–400)
RBC: 4.05 MIL/uL (ref 3.87–5.11)
RDW: 15.9 % — ABNORMAL HIGH (ref 11.5–15.5)
WBC: 8.5 10*3/uL (ref 4.0–10.5)

## 2010-12-14 LAB — POCT CARDIAC MARKERS
CKMB, poc: 2.5 ng/mL (ref 1.0–8.0)
Myoglobin, poc: 217 ng/mL (ref 12–200)
Troponin i, poc: <0.05 ng/mL (ref 0.00–0.09)

## 2010-12-22 ENCOUNTER — Other Ambulatory Visit: Payer: Self-pay | Admitting: Internal Medicine

## 2010-12-22 DIAGNOSIS — R609 Edema, unspecified: Secondary | ICD-10-CM

## 2010-12-24 ENCOUNTER — Ambulatory Visit
Admission: RE | Admit: 2010-12-24 | Discharge: 2010-12-24 | Disposition: A | Discharge status: Home / Self Care | Payer: 59 | Source: Ambulatory Visit | Attending: Internal Medicine | Admitting: Internal Medicine

## 2010-12-24 DIAGNOSIS — R609 Edema, unspecified: Secondary | ICD-10-CM

## 2011-01-21 NOTE — Discharge Summary (Signed)
[name], [name]          ACCOUNT NO.:  [id number]   MEDICAL RECORD NO.:  [id number]          PATIENT TYPE:  INP   LOCATION:  6741                         FACILITY:  [facility]   PHYSICIAN:  Isidor Holts, M.D.  DATE OF BIRTH:  11/17/1955   DATE OF ADMISSION:  09/04/2005  DATE OF DISCHARGE:                                 DISCHARGE SUMMARY   DATE OF DISCHARGE:  To be determined.   PRIMARY MEDICAL DOCTOR:  Candyce Churn. Allyne Gee, M.D.   DISCHARGE DIAGNOSES:  1.  Right middle lobe pneumonia.  2.  Smoker.  3.  Abnormal liver function tests, likely secondary to alcohol excess.  4.  Chronic low back pain.  5.  Degenerative joint disease, lumbosacral spine, status post laminectomy.  6.  History of peripheral neuropathy.   DISCHARGE MEDICATIONS:  1.  Avelox 400 mg p.o. daily to complete a seven-day course.  2.  Mucinex 600 mg p.o. b.i.d. for five days only.  3.  Albuterol MDI two puffs p.r.n. q.4-6h.  4.  Ibuprofen 600 mg p.o. t.i.d. with food, for five days only.  5.  Nicoderm CQ patch (21 mg/24 hr) place one patch to skin daily.   PROCEDURES:  1.  Chest x-ray dated September 04, 2005.  This showed right middle lobe air      space disease, compatible with pneumonia.  Followup to resolution is      recommended.  There is also a small right upper lobe opacity which may      be related to infectious process but followup is recommended as well.  2.  Abdominal ultrasound scan dated September 06, 2005.  This was a normal      abdominal ultrasound.  3.  Chest x-ray dated September 07, 2005.  This showed mildly progressive right      middle lobe pneumonia, stable bronchitic changes, increased prominence      of multiple small nodular opacities in the right upper lung zone with      decreased prominence of slightly large noted opacity.  The change in      these densities is compatible with nodular infection.   CONSULTATIONS:  None.   ADMISSION HISTORY:  As in H&P notes of September 04, 2005,  dictated by Dr.  Elliot Cousin.  However, in brief this is a 55 year old female, with a known  history of tobacco abuse, chronic low back pain, status post L4-S1 lumbar  laminectomy and microdiskectomy, status post total abdominal hysterectomy  and bilateral salpingo-oophorectomy, who presents with increasing shortness  of breath, cough, fever, chills, and generalized weakness.  She is noted to  have had a pneumonia in 2006.  She was found to have a cough productive of  yellow/greenish phlegm.  Physical examination demonstrated bilateral  wheezes.  She was found to have a low-grade fever of 100.4 and her oxygen  saturation was 88% on room air.  She was, therefore, admitted for further  evaluation, investigation, and management.   1.  Community-acquired right middle lobe pneumonia.  The patient presents      with fever, chills, increasing shortness of breath, and desaturation.  Chest x-ray, dated September 04, 2005, confirmed right middle lobe      pneumonia, although there was concern for a nodularity opacity in the      right upper zones.  She was managed with intravenous      Rocephin/azithromycin, bronchodilator nebulizers, oxygen      supplementation, with steady and progressive clinical response.  Chest x-      ray repeated, September 07, 2005, showed progression of density in the      right upper zone with multiple nodular small densities, apparently      compatible with infection.  The patient, however, is a smoker.  It is      recommended that she have followup chest x-ray in a month or two to      document interval resolution.  Her primary M.D. is expected to follow up      on this and arrange.   1.  Smoking history.  The patient has been consulted appropriately.  She is      determined to quit.  She was managed with Nicoderm CQ patch during the      course of her hospital stay.   1.  Abnormal LFTs.  The patient had mildly abnormal LFTs, at the time of      initial assessment  with a total bilirubin of 2.8, AST of 59.  The      patient insists that she drinks socially about once or twice weekly.      Hepatic/abdominal ultrasound scan, dated September 06, 2005, was completely      negative.  Hepatitis A, B, and C serology were also negative.  The      patient, however, had an elevated GGT of 150 suggestive of the fact that      these hepatic abnormalities are alcohol-related.  She has been strongly      urged to abstain from alcohol, and she has verbalized understanding.  Of      note, serial LFTs show some improvement in initial values.  On September 08, 2005, AST was 28, ALT 49, alkaline phosphatase 141.   1.  Chronic low back pain.  This did not prove problematic during the course      of the patient's hospital stay.   DISPOSITION:  The patient has steadily improved in symptomatology, during  the course of her hospital stay.  It is anticipated that she will be  discharged in the next couple of days either on September 10, 2005 or September 11, 2005, although this will be determined by discharging M.D.  Given her  history of recurrent pneumonias, she has been given pneumococcal  vaccination.  The question of possible flu vaccination was mooted, however,  it appears that the patient did have a flu-like illness a couple of weeks  ago and had  a nasal swab done by her PMD, which was reportedly positive for  influenza.  She at that time was treated with Tamiflu.  In view of this  background, it was elected not to administer influenza vaccination at this  time, as there appears to be no indication for it.  The patient is  recommended to return to regular duties, one week following hospital  discharge.   ACTIVITY:  As tolerated.   WOUND CARE:  Not applicable.   DIET:  No restrictions.   PAIN MANAGEMENT:  Not applicable.   FOLLOWUP INSTRUCTIONS:  The patient is to return to her PMD, Dr. Melina Schools  Sanders, per prior scheduled appointment, and certainly, within about  1-2  weeks.   SPECIAL INSTRUCTIONS:  Dr. Dorothyann Peng is expected to arrange followup  chest x-ray within 1-2 months, to assess interval resolution of the  patient's abnormal chest x-ray findings.  All this has been communicated to  the patient.  She has verbalized understanding.      Isidor Holts, M.D.  Electronically Signed     CO/MEDQ  D:  09/09/2005  T:  09/09/2005  Job:  [id number]   cc:   Candyce Churn. Allyne Gee, M.D.  Fax: [phone]

## 2011-01-21 NOTE — H&P (Signed)
[name], [name] NO.:  [id number]   MEDICAL RECORD NO.:  [id number]          PATIENT TYPE:  INP   LOCATION:  1825                         FACILITY:  [facility]   PHYSICIAN:  Elliot Cousin, M.D.    DATE OF BIRTH:  27-Mar-1956   DATE OF ADMISSION:  09/04/2005  DATE OF DISCHARGE:                                HISTORY & PHYSICAL   PRIMARY CARE PHYSICIAN:  Robyn N. Allyne Gee, M.D.   CHIEF COMPLAINT:  Shortness of breath, cough, fever, chills, and generalized  weakness.   HISTORY OF PRESENT ILLNESS:  The patient is a 55 year old lady with a past  medical history significant for chronic low back pain, status post lumbar  laminectomy in the past, neuropathy, and pneumonia last year, who presents  to the emergency department with a chief complaint of productive cough,  generalized malaise, fever, chills, and shortness of breath. The patient's  symptoms started 2 weeks ago as a cold.  Her symptoms initially started  with a stuffy nose and a sore throat.  The symptoms progressed to a  productive cough with yellow and green sputum, wheezes, shortness of breath,  generalized malaise, diffuse achiness, and fever and chills. Several days  ago the patient's temperature reached a high of 103 degrees F.  The patient  also had nausea and vomiting x2 several days ago.  The nausea and vomiting  have resolved.  No current complaints of sore throat, nasal congestion, or  earaches.  The patient does have pleuritic chest pain primarily on the right  greater than left.  She generally smokes 1 pack of cigarettes per day;  however, over the past several days, she has been unable to smoke any.   During the evaluation in the emergency department, the patient was noted to  be febrile with temperature of 100.4.  Her pulse was 100, and her oxygen  saturation was 88% on room air.  Her lab data are significant for a total  bilirubin of 2.8, SGOT 59, and serum potassium of 5.4.  Her white blood  cell  count is within normal limits at 7.1.  Her chest x-ray reveals a right  middle lobe pneumonia.  The patient will be admitted for further evaluation  and management.   PAST MEDICAL HISTORY:  1.  Tobacco abuse.  2.  Status post L4 to S1 lumbar laminectomy and microdiskectomy September      2004 by Dr. Newell Coral.  3.  Peripheral neuropathy secondary to history of lumbar back surgery.  4.  History of pneumonia last year.  5.  Status post total abdominal hysterectomy and bilateral salpingo-      oophorectomy for history of precervical cancer.   MEDICATIONS:  Neurontin 300 mg 3 times a day.   ALLERGIES:  No known drug allergies.   SOCIAL HISTORY:  The patient is married and [place]  She has two children.  She is employed as an  Airline pilot.  She smokes one pack of cigarettes per day and has been doing so  for more than 20 years.  She drinks socially, perhaps 1 to 2  drinks per  week.  She denies illicit drug use.   FAMILY HISTORY:  Her father is in his late 45s.  He has a history of  coronary artery disease status post CABG.  He also has diabetes mellitus.  Her mother is 70 years of age and has a history of diabetes mellitus.   REVIEW OF SYSTEMS:  The patient's Review of Systems is positive for  occasional low back pain and numbness and tingling in her feet and her legs.  Otherwise Review of Systems is negative.   PHYSICAL EXAMINATION:  VITAL SIGNS:  Temperature 100.4, blood pressure  146/90, pulse 100, respiratory rate 24, oxygen saturation 94% on 2 liters of  nasal cannula oxygen.  GENERAL: The patient is a pleasant 55 year old Caucasian lady who is  currently sitting up in bed.  She appears ill.  HEENT:  Head is normocephalic and atraumatic.  Pupils equal, round, and  reactive to light. Extraocular movements are intact.  Conjunctivae clear.  Sclerae white.  Tympanic membranes are clear bilaterally.  Nasal mucosa is  moist.  No  sign of tenderness.  Oropharynx is moist with no posterior  exudate or erythema.  Teeth are in fair repair.  NECK:  Supple.  No adenopathy, no thyromegaly, no bruit, no JVD.  LUNGS:  The patient's breathing is not labored.  There are a few crackles  and wheezes throughout her lung fields; however, they are greater on the  right.  HEART:  S1, S2 with mild tachycardia.  ABDOMEN:  Well-healed hypogastric scar.  Mildly obese.  Positive bowel  sounds.  Flat, nontender, nondistended.  No hepatosplenomegaly.  RECTAL AND GU:  Deferred.  EXTREMITIES:  Pedal pulses 2+ bilaterally.  No pedal edema.  No pretibial  edema.  The patient has good range of motion of her extremities.  No acute  abnormalities.  NEUROLOGIC:  The patient is alert and oriented x3.  Cranial nerves II-XII  intact.  Strength 5/5 throughout.   ADMISSION LABORATORY DATA:  Sodium 133, potassium 5.4, chloride 96, CO2 27,  glucose 118, BUN 12, creatinine 0.7, calcium 8.7, total protein 7.4, albumin  3.1, AST 59, ALT 18, alkaline phosphatase 113, total bilirubin 2.8.  WBC  7.1, hemoglobin 15.5, hematocrit 45.3, MCV 95.1, platelets 213.   Chest x-ray reveals right middle pneumonia, nodular opacity over the right  upper lobe (may be related to infection, followup recommended).   ASSESSMENT:  1.  Shortness of breath, productive cough, fever, chills, wheezes,      generalized malaise, all secondary to pneumonia.  2.  Tobacco abuse.  3.  Elevated total bilirubin and elevated SGOT.  The patient's total      bilirubin is 2.8, and her SGOT is 59.  The elevations could be secondary      to pneumonia and/or history of alcohol use.  The patient says she only      drinks socially once or twice weekly.  4.  Hyperkalemia. The patient's serum potassium is 5.4.  This could be      secondary to hemolysis; however, there is no notation for hemolysis.   PLAN: 1.  The patient will be admitted to a telemetry bed for further evaluation      and  management.  2.  The patient was given 1 g Rocephin and 500 mg azithromycin by the      emergency department physician.  We will continue antibiotic therapy      with Rocephin and Azithromycin.  3.  We will check  blood cultures x2 and sputum cultures as well.  4.  We will check an urine Legionella antigen.  5.  We will add albuterol nebulizer q.4h. and then 1.2h. p.r.n.  6.  Symptomatic treatment with Tussionex and Humibid LA twice daily.  7.  Will give Pneumovax and influenza vaccine prior to hospital discharge.  8.  Will check an acute viral hepatitis panel for further assessment of      elevated bilirubin and SGOT.  9.  Repeat chest x-ray in 2 days.  If the right upper lobe nodular opacity      does not resolve during this hospitalization, an outpatient chest x-ray      in 3 to 6 months is recommended.  10. Tobacco cessation counseling. Nicotine patch if needed.  11. Start gentle IVF. Both the IV fluids and beta agonist, albuterol, should      help to lower the serum potassium. Follow up on the repeat labs as      ordered.      Elliot Cousin, M.D.  Electronically Signed     DF/MEDQ  D:  09/04/2005  T:  09/04/2005  Job:  [id number]   cc:   Candyce Churn. Allyne Gee, M.D.  Fax: [phone]

## 2011-01-21 NOTE — Letter (Signed)
November 22, 2006    [name]. Allyne Gee, M.D.  [address]  [address]  [place], [place] [address]   RE:  [name], [name]  MRN:  [id number]  /  DOB:  1956/05/26   Dear Dr. Allyne Gee:   It was my pleasure to evaluate [name] as an outpatient at  the [facility] clinic on November 22, 2006.  As you know she is a  55 year old women presenting with a chief complaint of chest pain.   [name] developed flu like symptoms a few weeks back and after  that she has had multiple symptoms which include substernal chest pain  as well as discomfort under her arms radiating to the jaw as well as the  left shoulder.  She has also had some abdominal burning.  Her symptoms  occurred over a 7-day period and seemed progressive over the course of  each day.  She had very minor symptoms upon awakening in the morning,  but her symptoms became worse as the day progressed.  She had some  dizziness as well.  She describes her pains as sharp, but has a  difficult time characterizing them overall.  There was not a clear  exertion component to her symptoms.  Over the past few days her symptoms  have improved.  She still has some chest pain and cough, but overall is  feeling better.  She complains of associated symptoms of dyspnea as well  diaphoresis.  She also had some dull headaches that accompanied her  pain.  She did not have nausea or vomiting.  She denies syncope.   PAST MEDICAL HISTORY:  Pertinent for the following:  1. Essential hypertension.  2. Hyperlipidemia.  3. History of pneumonia.  4. Chronic obstructive pulmonary disease, apparently mild.  5. Total abdominal hysterectomy in 1996.  6. Lumbar effusion in 2004.  7. Surgery for a mandibular fracture in 1980.   CURRENT MEDICATIONS:  1. Aspirin 325 mg daily.  2. Gabapentin 300 mg t.i.d.  3. Premarin 0.625 mg daily.  4. Lexapro 20 mg daily.  5. Diovan HCT 160/12.5 mg daily.   ALLERGIES:  No known drug allergies.   SOCIAL HISTORY:  The patient is married with 2 children.  She works as  an Airline pilot.  She smokes one to one and a half packs of cigarettes  daily over a 30 year time period.  She has no history of drug use.  She  drinks 1-2 glasses of wine per week.  She does not exercise.   FAMILY HISTORY:  Pertinent for coronary artery disease in her father.  He had coronary bypass surgery at age 73.  Her mother is alive and well  without any coronary problems.  She has a brother who has hypertension,  diabetes, lung cancer and prostate cancer.   REVIEW OF SYSTEMS:  A complete 12 point review of systems was preformed.  Pertinent positives included anxiety, depression, fatigue, ankle edema,  palpitations, headaches, dizziness and occasional calf pain.  All other  systems were reviewed and are negative except as detailed above.   PHYSICAL EXAMINATION:  The patient is alert and oriented.  She is in no  acute distress.  Weight is 144 pounds, blood pressure is 108/54 in the right arm, 115/73  in the left arm, heart rate 71, respiratory rate 16.  HEENT:  Normal.  NECK:  Normal carotid upstrokes without bruits, jugular venous pressure  is normal, there is no thyromegaly or thyroid nodules.  LUNGS:  Clear bilaterally  with the exception of fine bibasilar rales.  HEART:  The apex is discrete and nondisplaced, there is no right  ventricular heave or lift.  The heart is regular rate and rhythm without  murmurs or gallops.  ABDOMEN:  Soft, nontender, no organomegaly, no abdominal bruits, no  masses, no rebound or guarding.  BACK:  There is no costovertebral angle tenderness.  EXTREMITIES:  There is no clubbing, cyanosis or edema, peripheral pulses  are 2+ and equal throughout.  SKIN:  Warm and dry without rash.  NEUROLOGIC:  Cranial nerves 2 through 12 are intact, strength is 5/5 and  equal in the arms and legs bilaterally.  EKG:  Shows normal sinus rhythm and is within normal limits.   ASSESSMENT:  [name] is a 55 year old women with chest pain  that has both typical and atypical features.  Certainly ischemic heart  disease is in the differential for the etiology of her symptoms.  With  her underlying cardiac risk factors that include a premature family  history of coronary artery disease, tobacco use and hypertension, she is  at moderate risk of having underlying coronary artery disease.  I think  we should proceed with a exercise Myoview stress test.  If she has  significant ischemia with either a moderate or high risk study, she will  require diagnostic angiography.  If she has either a normal or low risk  study, I would proceed with continued medical therapy to treat her risk  factors.   I reviewed this in detail with [name] today and is agreeable to  proceed with a stress test.  I will be in contact with her as soon as  the results are available.  In the meantime she should continue with her  current medical therapy for treatment of her hypertension as well as a  daily aspirin.   Dr. Allyne Gee, thanks again for allowing me to evaluate [name].  Please feel free to call at anytime with questions regarding her care.    Sincerely,      Veverly Fells. Excell Seltzer, MD  Electronically Signed    MDC/MedQ  DD: 11/22/2006  DT: 11/23/2006  Job #: [id number]

## 2011-01-21 NOTE — Op Note (Signed)
NAME:  [name], PARKISON                    ACCOUNT NO.:  [id number]   MEDICAL RECORD NO.:  [id number]                   PATIENT TYPE:  INP   LOCATION:  3010                                 FACILITY:  [facility]   PHYSICIAN:  Hewitt Shorts, M.D.            DATE OF BIRTH:  1955-10-07   DATE OF PROCEDURE:  06/05/2003  DATE OF DISCHARGE:                                 OPERATIVE REPORT   PREOPERATIVE DIAGNOSES:  1. Left L5-S1 spondylitic disk herniation.  2. Spondylosis.  3. Degenerative disk disease.  4. Radiculopathy.   POSTOPERATIVE DIAGNOSES:  1. Left L5-S1 spondylitic disk herniation.  2. Spondylosis.  3. Degenerative disk disease.  4. Radiculopathy.   PROCEDURE:  Left L5-S1 lumbar laminotomy and microdiskectomy.   SURGEON:  Hewitt Shorts, M.D.   ASSISTANT:  Stefani Dama, M.D.   ANESTHESIA:  General endotracheal.   INDICATIONS FOR PROCEDURE:  The patient is a 55 year old woman who presented  with left lumbar radiculopathy, who was found by an MRI scan to have a  spondylitic disk herniation to the left at L5-S1.  The decision was made to  proceed with an elective laminotomy and microdiskectomy.   DESCRIPTION OF PROCEDURE:  The patient was brought to the operating room and  placed under general endotracheal anesthesia.  The patient was turned to the  prone position.  The lumbar region was prepped with DuraPrep and draped in a  sterile fashion.  The midline was infiltrated with local anesthetic with  epinephrine.  A midline incision was made and carried down through the  subcutaneous tissue.  Bipolar cautery and electrocautery were used to  maintain hemostasis.  The incision was carried down to the lumbar fascia  which was incised on the left side of the midline.  The paraspinous muscles  were dissected from the spinous process and lamina in a subperiosteal  fashion.  The L5-S1 interlaminar space was identified.  An x-ray was taken  to confirm the  localization.  We then brought in the operating microscope to  provide additional magnification illumination and visualization, and the  remainder of the procedure was performed using microdissection and  microsurgical technique.  A laminotomy was performed using the Black-Max  drill along with Kerrison punches.  The ligament of flavum was carefully  resected, and the thecal sac and left S1 nerve root were identified.  These  were gently retracted medially, exposing the spondylitic disk herniation.  This was removed by incising the remaining ligamentous and annular fibers.  The spondylitic overgrowth from the posterior aspects of L5 and S1 were  removed.  The herniated disk material was removed.  A decompression of the  thecal sac and nerve root was achieved.  We carried out a decompression to  approximately the midline.  It was difficult to enter the disk space itself,  due to its marked narrowing and spondylitic degeneration.  In the end, all  these fragments of disk material were  removed.  The spondylitic overgrowth  on the left side that had been compressing the left S1 nerve root had been  removed, and good decompression was established.  We then checked for  hemostasis, which was established and confirmed.  Then we instilled 2 mL of  fentanyl and 80 mg of Depo-Medrol into the epidural space and proceeded with  the closure.  The deep fascia was closed with interrupted undyed #1 Vicryl  suture, the subcutaneous and subcuticular layers closed with interrupted and  inverted #2-0 undyed Vicryl sutures, and the skin was reapproximated with  DermaBond.   The patient tolerated the procedure well.  The estimated blood loss was 25  mL.  The sponge count was correct.  Following surgery the patient was turned  back to the supine position, reversed the anesthetic, extubated, and was  transferred to the recovery room for further care, where she was noted to be  moving all four extremities to  command.                                                Hewitt Shorts, M.D.    RWN/MEDQ  D:  06/05/2003  T:  06/05/2003  Job:  [id number]

## 2011-01-21 NOTE — Discharge Summary (Signed)
[name], [name]          ACCOUNT NO.:  [id number]   MEDICAL RECORD NO.:  [id number]          PATIENT TYPE:  INP   LOCATION:  6741                         FACILITY:  [facility]   PHYSICIAN:  Marcie Mowers, M.D.DATE OF BIRTH:  28-Mar-1956   DATE OF ADMISSION:  09/04/2005  DATE OF DISCHARGE:  09/11/2005                                 DISCHARGE SUMMARY   ADDENDUM:  Please refer to the discharge summary done by Dr. Brien Few on September 09, 2005.   Patient has done well over the last 2 days. Her shortness of breath has  significantly improved. She does have still some cough with clear sputum  production.   The patient's oxygen saturation post ambulation on room air was checked. It  was about 93% to 93%. The patient did not have any shortness of breath with  ambulation.   The patient is being discharged on the following medications:   DISCHARGE MEDICATIONS:  1.  Avelox 400 mg p.o. daily.  2.  Complete 7 days of oral antibiotics.  3.  Albuterol inhaler to be inhaled every 4 to 6 hours as needed for      shortness of breath.  4.  The patient is also being prescribed nebulizer machine and nebulizer      Albuterol solution to be used every 4 hours as needed for shortness of      breath.  5.  Mucinex b.i.d. orally as needed for 5 days.  6.  The patient is being also prescribed Nicoderm CQ patch, as she is      motivated to quit smoking. She will use it as instructed.   CONDITION ON DISCHARGE:  Good. She is afebrile. Vital signs are within  normal limits.   FOLLOW UP:  1.  The patient is to followup with her primary care physician in 1 week.  2.  She is to followup for her chest x-ray findings:      1.  Of the right middle lobe pneumonia. This needs to be followed to          resolution.      2.  There is a persistent right upper lobe opacity, which has been          present for several weeks. This will need to be followed up as well.            ______________________________  Marcie Mowers, M.D.     PMJ/MEDQ  D:  09/11/2005  T:  09/11/2005  Job:  [id number]   cc:   Candyce Churn. Allyne Gee, M.D.  Fax: [phone]

## 2011-01-27 ENCOUNTER — Ambulatory Visit: Payer: 59 | Admitting: Cardiovascular Disease

## 2011-02-01 ENCOUNTER — Ambulatory Visit: Payer: 59 | Attending: Neurology | Admitting: Physical Therapy

## 2011-02-01 DIAGNOSIS — M6281 Muscle weakness (generalized): Secondary | ICD-10-CM | POA: Insufficient documentation

## 2011-02-01 DIAGNOSIS — IMO0001 Reserved for inherently not codable concepts without codable children: Secondary | ICD-10-CM | POA: Insufficient documentation

## 2011-02-01 DIAGNOSIS — R269 Unspecified abnormalities of gait and mobility: Secondary | ICD-10-CM | POA: Insufficient documentation

## 2011-02-01 DIAGNOSIS — I69998 Other sequelae following unspecified cerebrovascular disease: Secondary | ICD-10-CM | POA: Insufficient documentation

## 2011-02-02 ENCOUNTER — Ambulatory Visit: Payer: 59 | Admitting: Physical Therapy

## 2011-02-04 ENCOUNTER — Ambulatory Visit: Payer: 59 | Admitting: Physical Therapy

## 2011-02-08 ENCOUNTER — Ambulatory Visit: Payer: 59 | Attending: Internal Medicine | Admitting: Physical Therapy

## 2011-02-08 DIAGNOSIS — M6281 Muscle weakness (generalized): Secondary | ICD-10-CM | POA: Insufficient documentation

## 2011-02-08 DIAGNOSIS — R269 Unspecified abnormalities of gait and mobility: Secondary | ICD-10-CM | POA: Insufficient documentation

## 2011-02-08 DIAGNOSIS — IMO0001 Reserved for inherently not codable concepts without codable children: Secondary | ICD-10-CM | POA: Insufficient documentation

## 2011-02-08 DIAGNOSIS — I69998 Other sequelae following unspecified cerebrovascular disease: Secondary | ICD-10-CM | POA: Insufficient documentation

## 2011-02-11 ENCOUNTER — Ambulatory Visit: Payer: 59 | Admitting: Rehabilitative and Restorative Service Providers"

## 2011-02-11 IMAGING — CT CT HEAD W/O CM
3 of 5 series · 16 of 37 positions shown, 19 images · non-contrast
Comparison: CT 11/22/2009
COMPARISON: None.

CLINICAL DATA: CT HEAD WITHOUT CONTRAST,CT CERVICAL SPINE WITHOUT CONTRAST
TECHNIQUE: Contiguous axial images were obtained from the base of
the skull through the vertex without contrast.,Technique:
Multidetector CT imaging of the cervical spine was performed.
Multiplanar CT image reconstructions were also generated.
TECHNIQUE: Multidetector CT imaging of the cervical spine was
performed without intravenous contrast.  Multiplanar CT image
reconstructions were also generated.

[Series 3: recon 2: brain · axial · 0.49mm/px · z∈[+287,+431]mm · 9 of 64 slices shown, 12 images]
[im 7/64  brain]
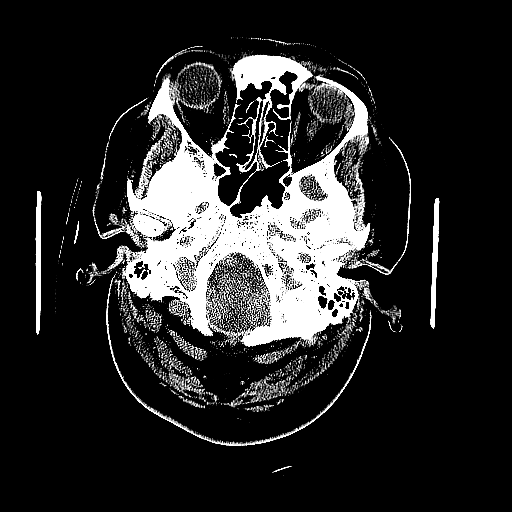
[im 7/64  bone]
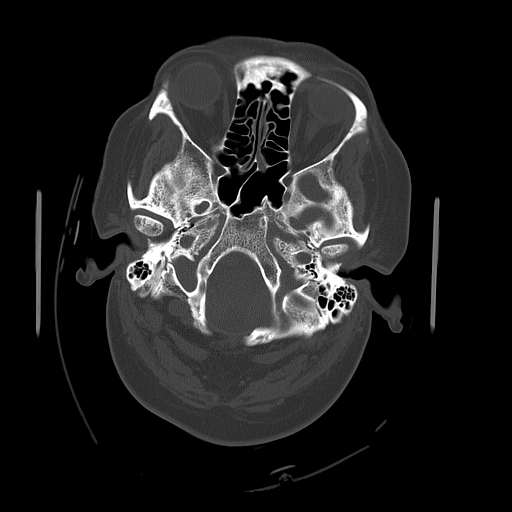
[im 13/64  brain]
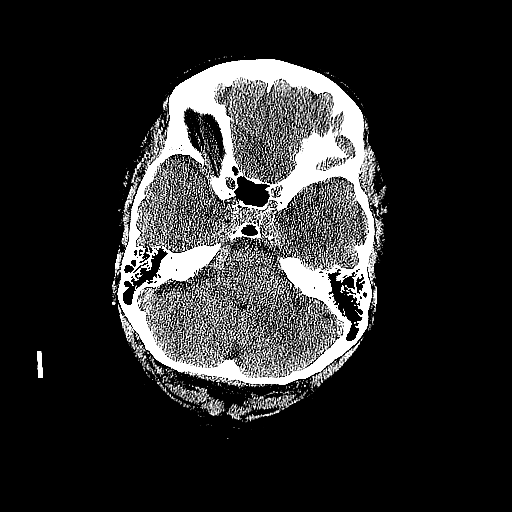
[im 19/64  brain]
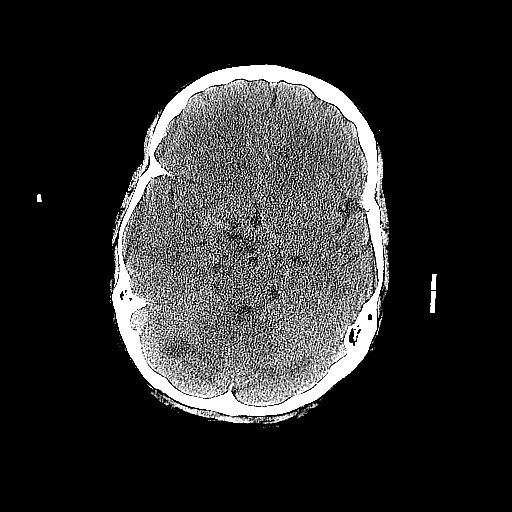
[im 26/64  brain]
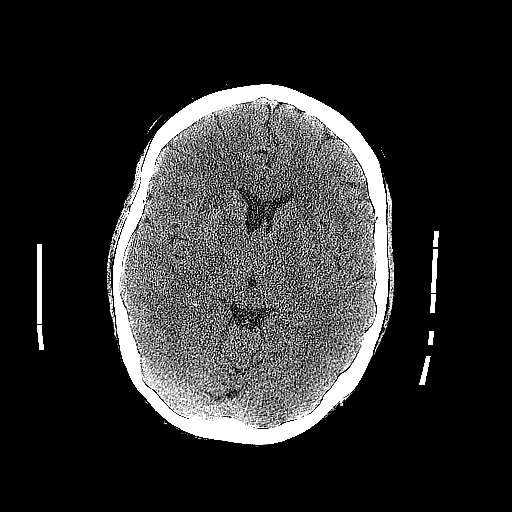
[im 32/64  brain]
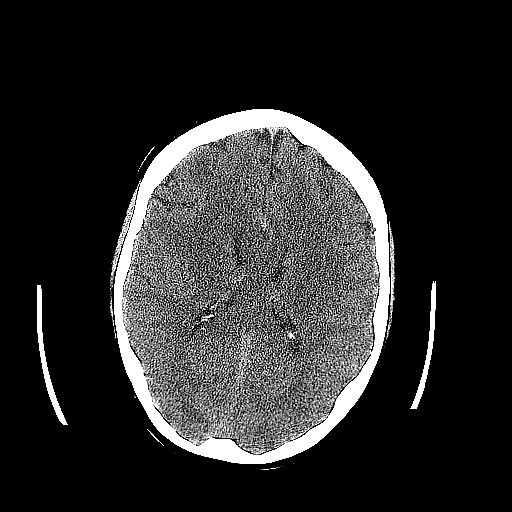
[im 32/64  bone]
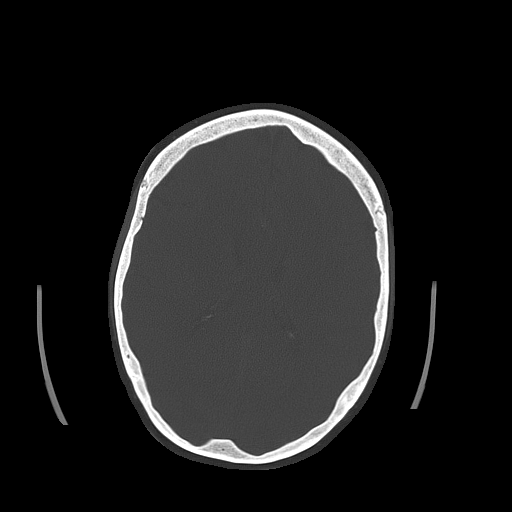
[im 38/64  brain]
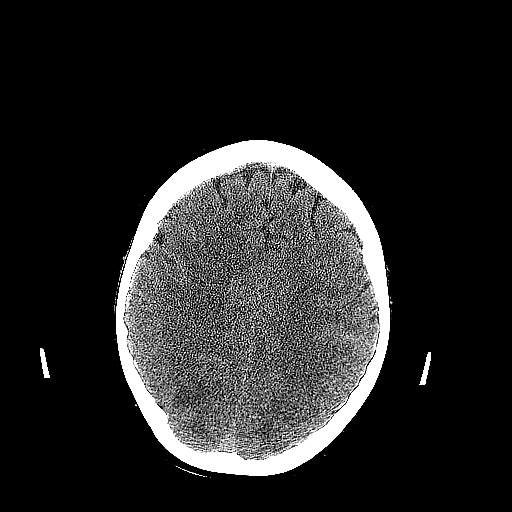
[im 45/64  brain]
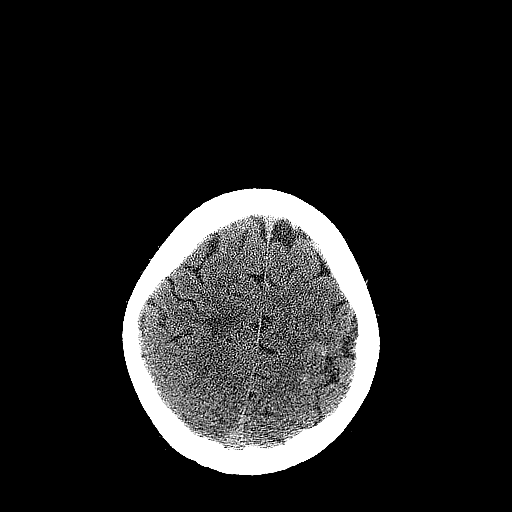
[im 51/64  brain]
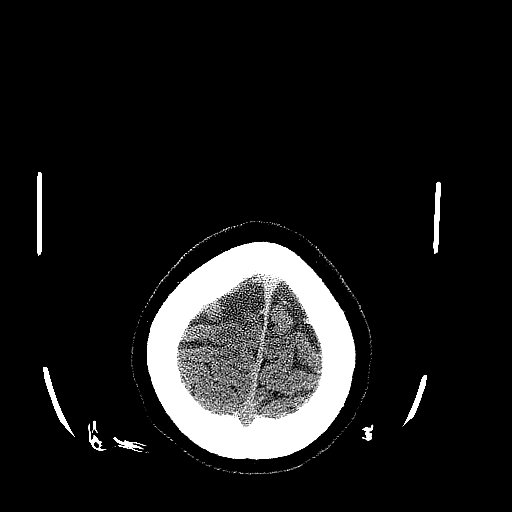
[im 57/64  brain]
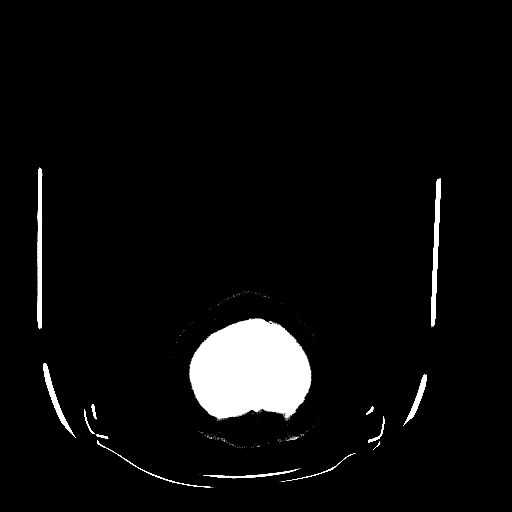
[im 57/64  bone]
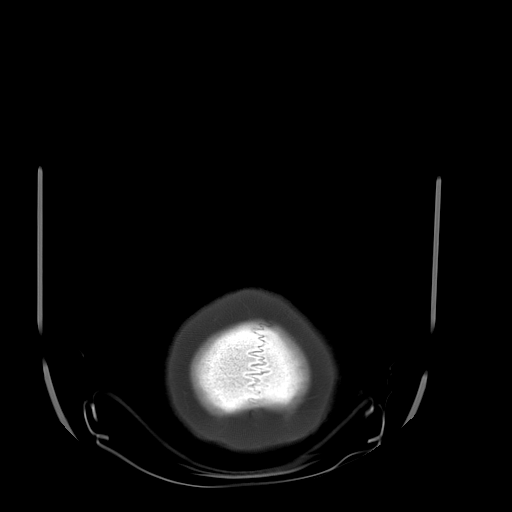

[Series 5: recon 2: c-spine · axial · 0.23mm/px · z∈[+34,+117]mm · 4 of 86 slices shown]
[im 7/86  brain]
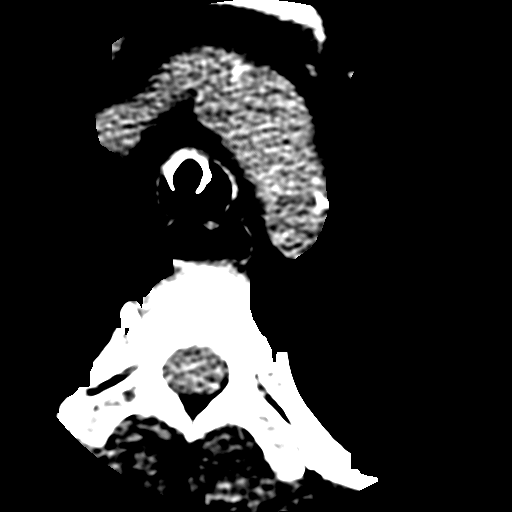
[im 20/86  brain]
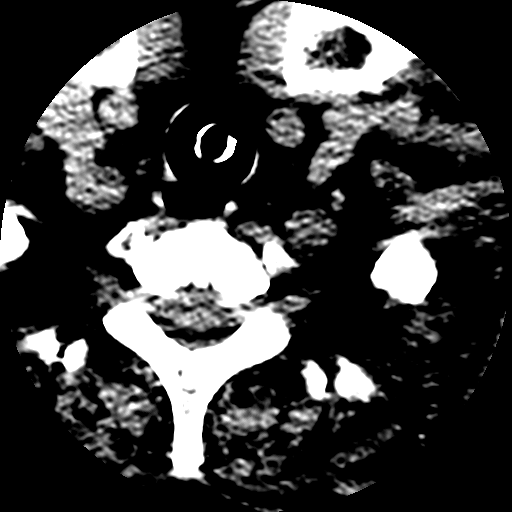
[im 27/86  brain]
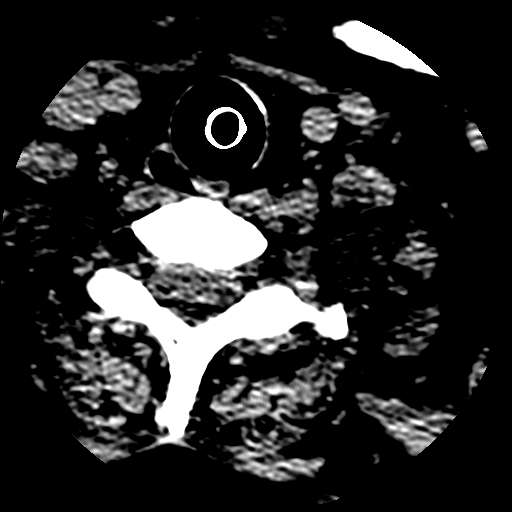
[im 40/86  brain]
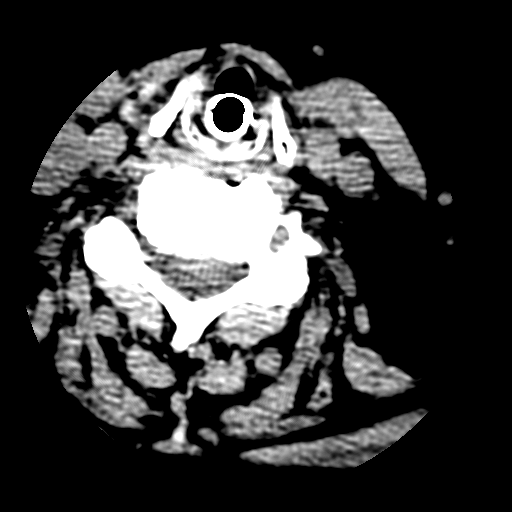

[Series 601: cor csp 2mm · coronal · 0.43mm/px · 3 of 46 slices shown]
[im 5/46  brain]
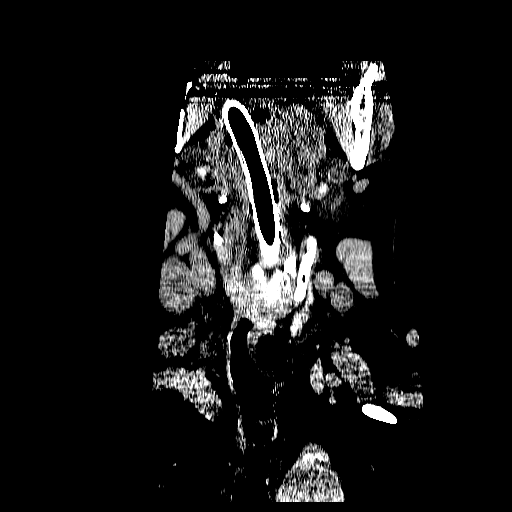
[im 9/46  brain]
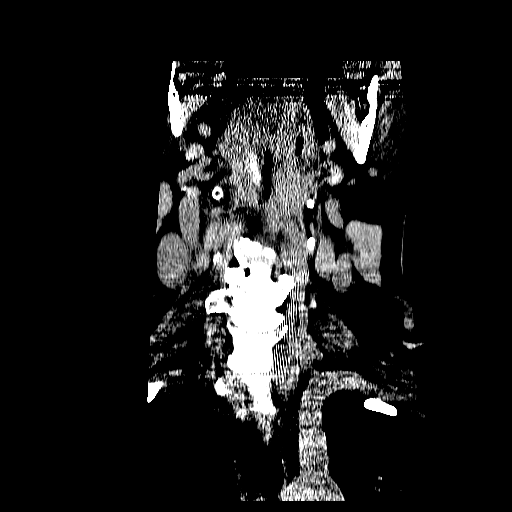
[im 12/46  brain]
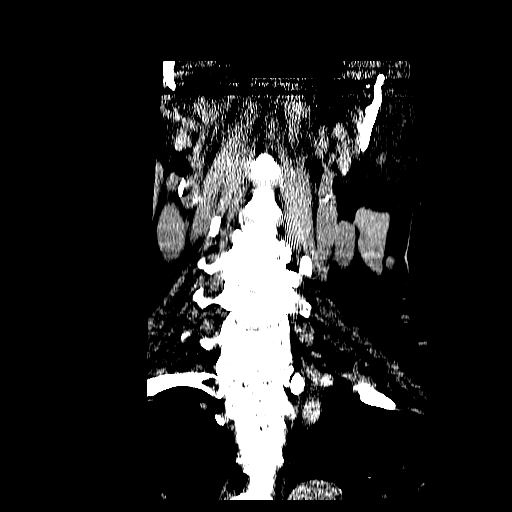

[16 of 37 positions shown; findings below may reference images not displayed]

FINDINGS: Interval development of new subarachnoid hemorrhage in
the left upper posterior frontal sulci.  Hypoattenuation
subcortical white matter in that region as well.  This may be due
to injury.  Vague hypodensity right frontal vertex deep white
matter compatible with chronic small vessel disease.

Bilateral cerebellar and posterior parietal low attenuation foci
mainly involving the white matter.  Considerations include acute
ischemia, contusions, and likely posterior reversible
encephalopathic syndrome (PRES).
IMPRESSION: Focal subarachnoid hemorrhage left upper frontal lobe. Bilateral
cerebellar and bilateral posterior parietal low attenuation foci.
See comments above.  Favor the diagnosis of PRES. Recommend MRI for
further evaluation.

CT CERVICAL SPINE WITHOUT CONTRAST
FINDINGS: No fracture or subluxation.  Degenerative changes mainly
at C5-6.  Disc space narrowing and osteophytic formation.  Central
canal stenosis at C5-6.
IMPRESSION: Spondylosis at C5-6.  No fracture or subluxation.

## 2011-02-12 IMAGING — CR DG CHEST 1V PORT
1 series · 1 of 1 positions shown · non-contrast
Comparison: Yesterday

CLINICAL DATA: Seizure

PORTABLE CHEST - 1 VIEW

[view not recorded]
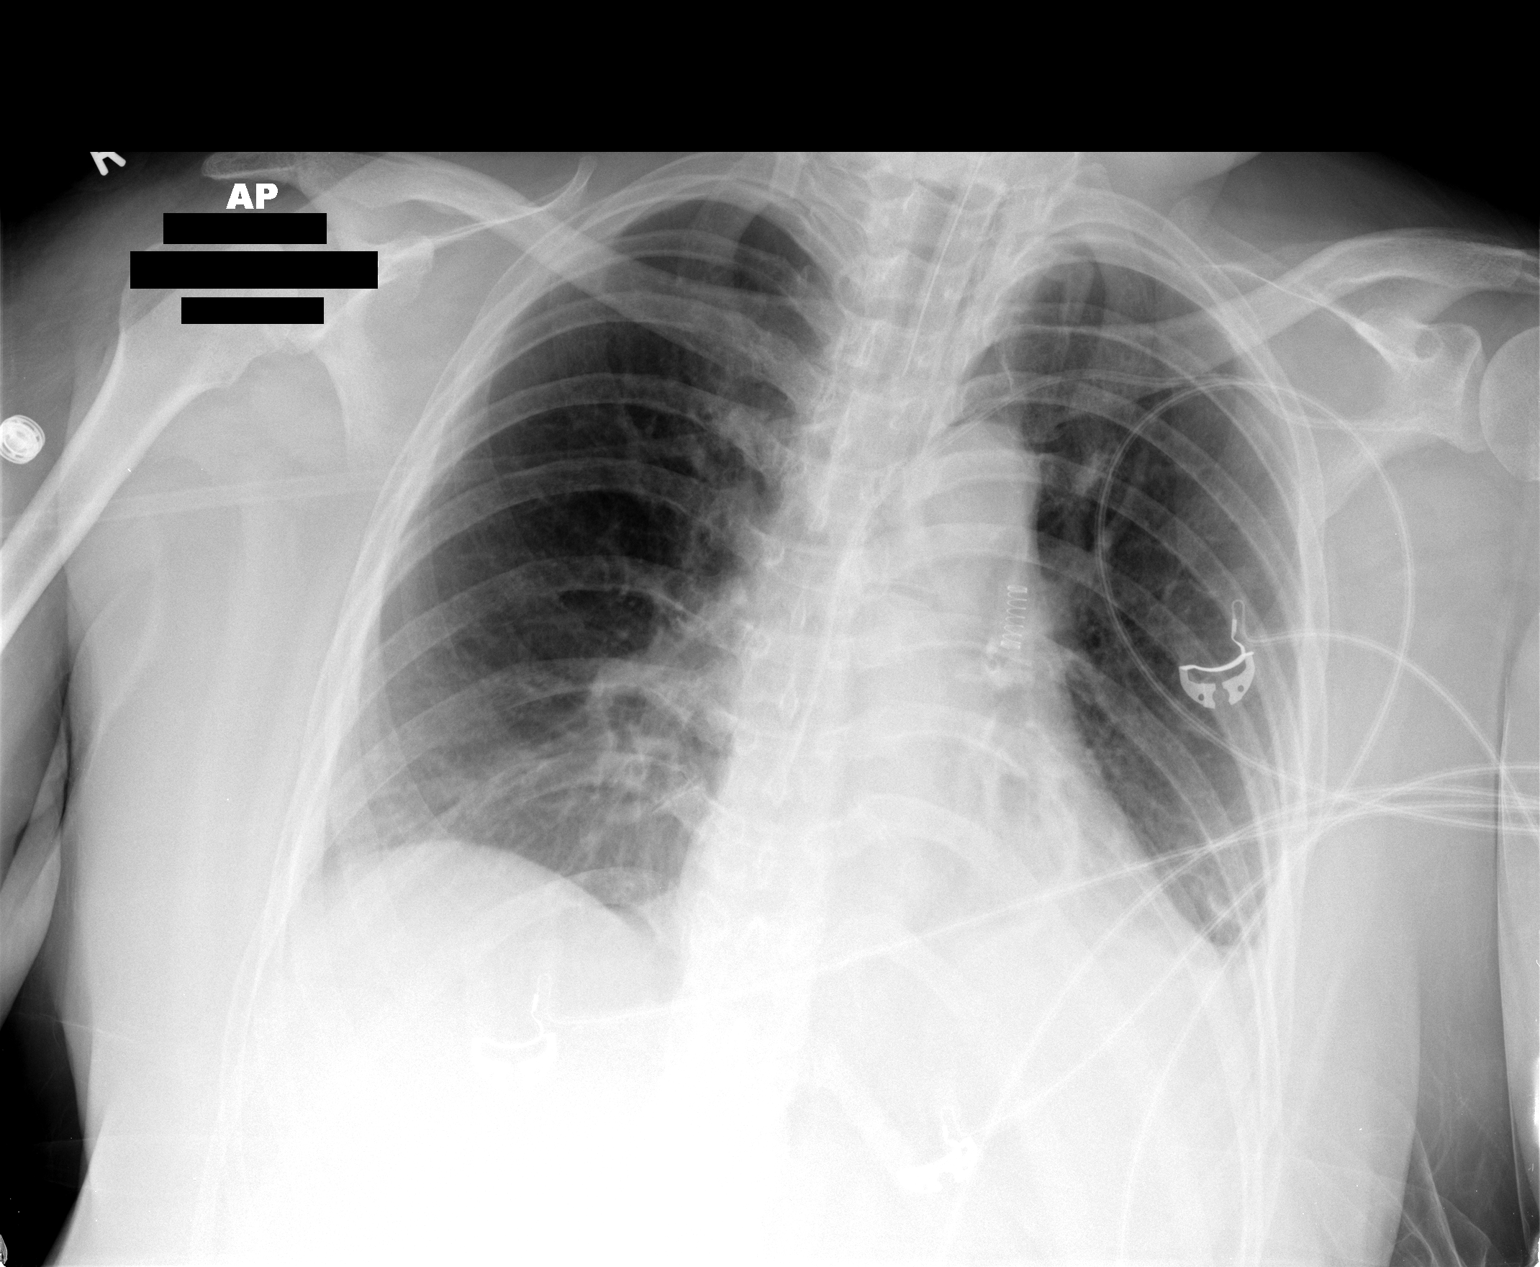

[1 of 1 positions shown; findings below may reference images not displayed]

FINDINGS: Stable left base atelectasis.  Otherwise clear lungs.
Endotracheal tube and left subclavian center venous catheter are
stable. No pneumothorax.
IMPRESSION: Stable.

## 2011-02-15 ENCOUNTER — Ambulatory Visit: Payer: 59 | Admitting: Rehabilitative and Restorative Service Providers"

## 2011-02-15 IMAGING — CT CT HEAD W/O CM
1 of 2 series · 13 of 30 positions shown, 17 images · non-contrast
Comparison: 11/30/2009.

CLINICAL DATA: Seizure.  Altered mental status.

CT HEAD WITHOUT CONTRAST
TECHNIQUE: Contiguous axial images were obtained from the base of
the skull through the vertex without contrast.

[Series 1: — · axial · 0.49mm/px · z∈[+9,+169]mm · 13 of 38 slices shown, 17 images]
[im 3/38  brain]
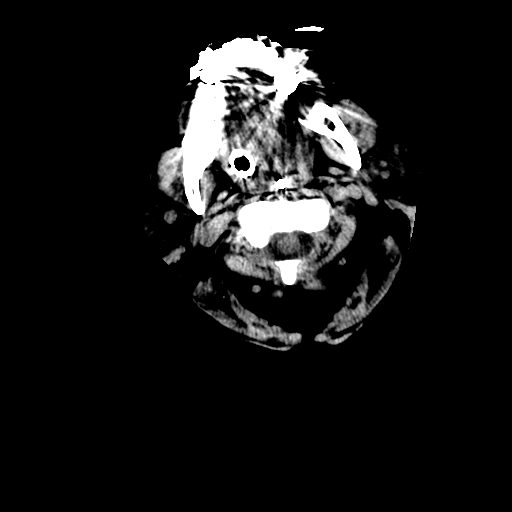
[im 3/38  bone]
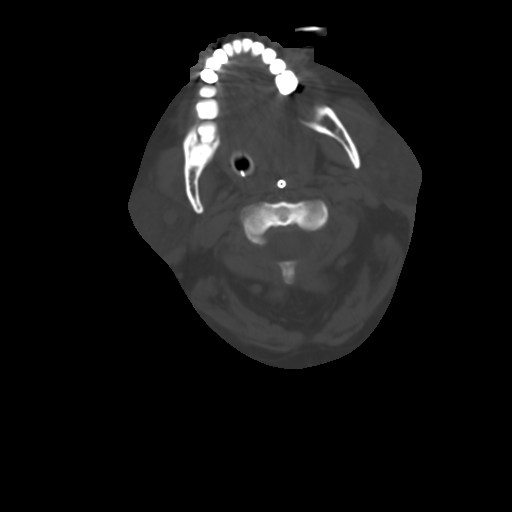
[im 6/38  brain]
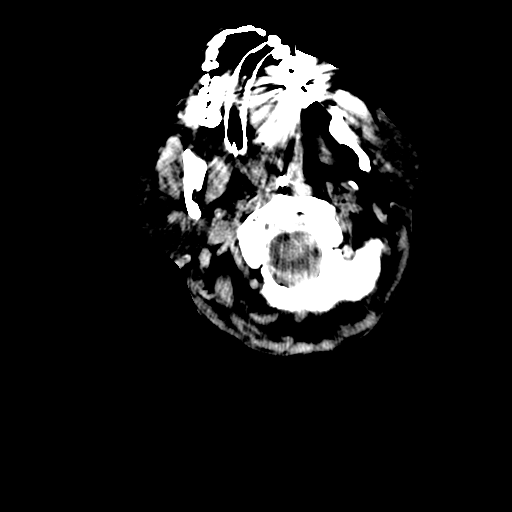
[im 8/38  brain]
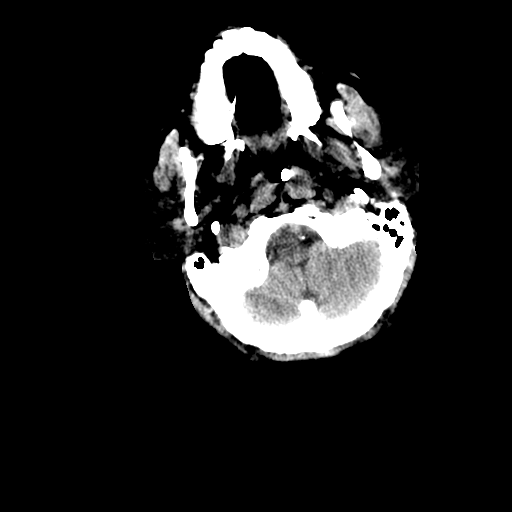
[im 11/38  brain]
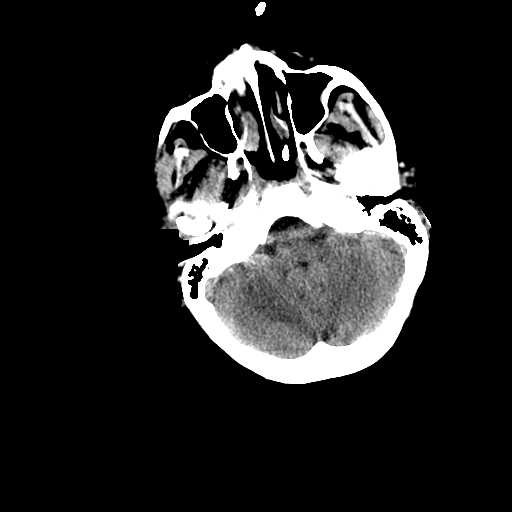
[im 14/38  brain]
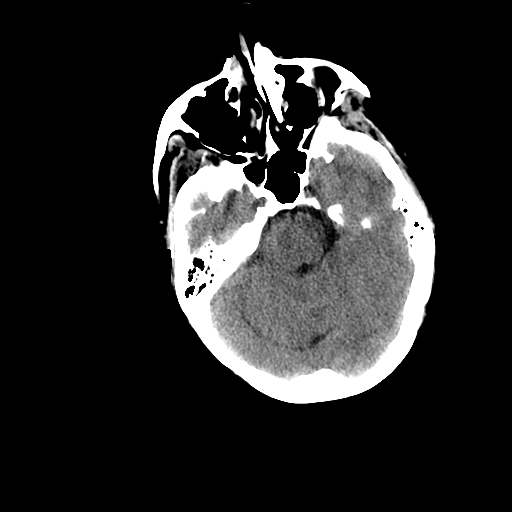
[im 14/38  bone]
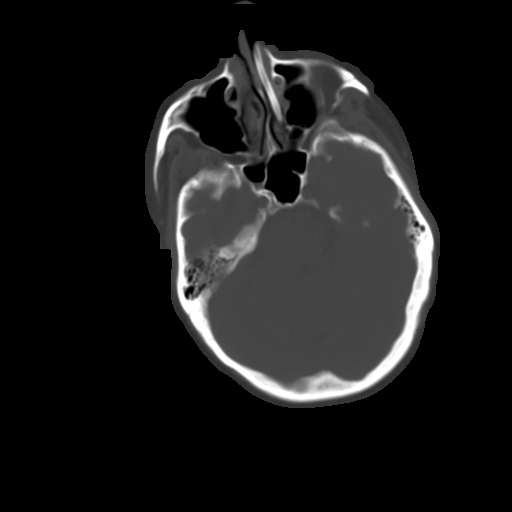
[im 16/38  brain]
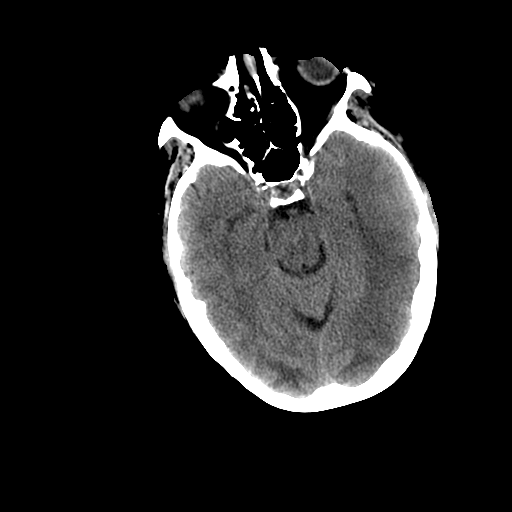
[im 19/38  brain]
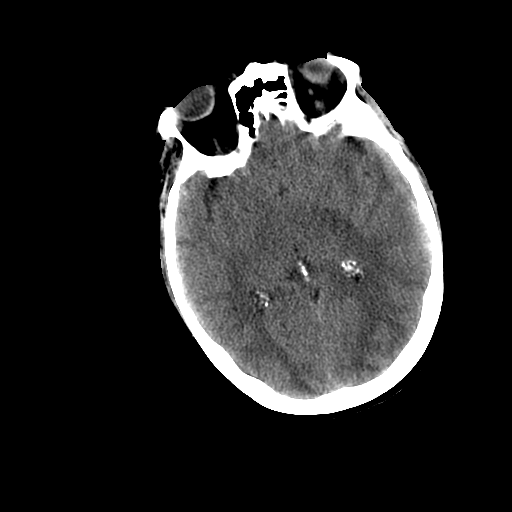
[im 22/38  brain]
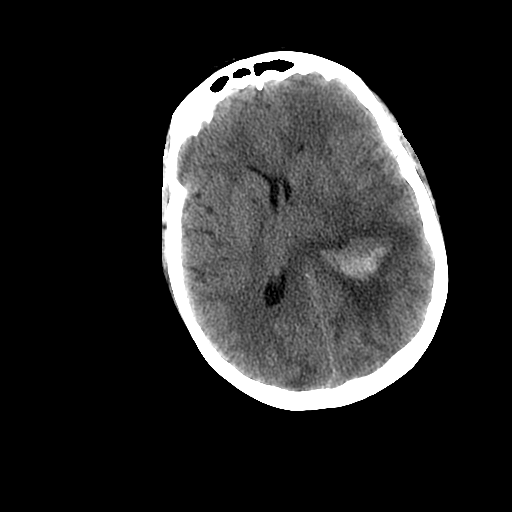
[im 24/38  brain]
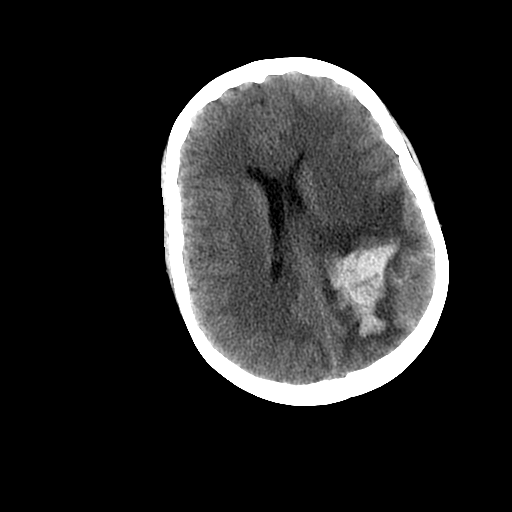
[im 24/38  bone]
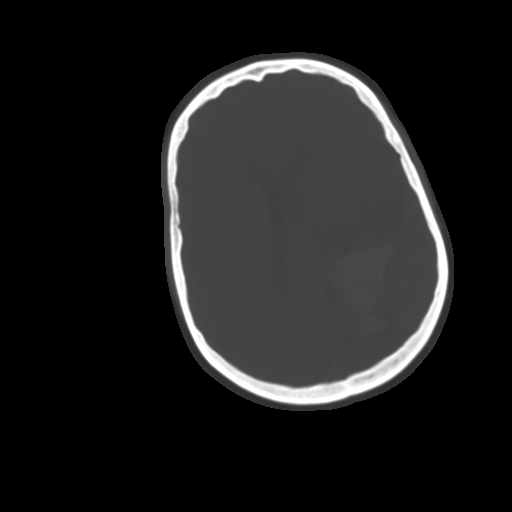
[im 27/38  brain]
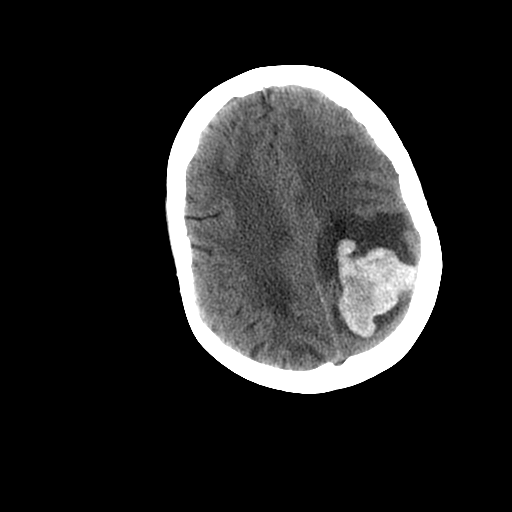
[im 30/38  brain]
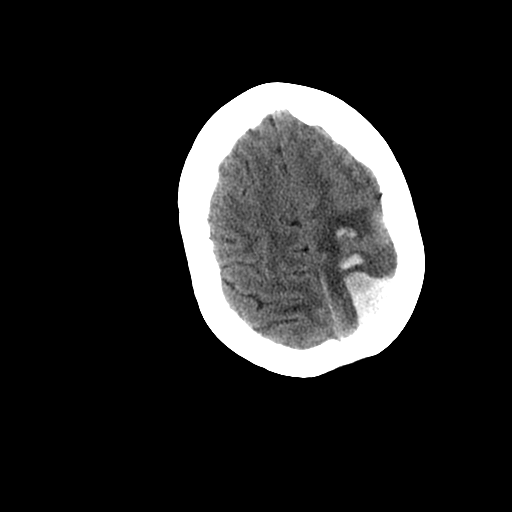
[im 32/38  brain]
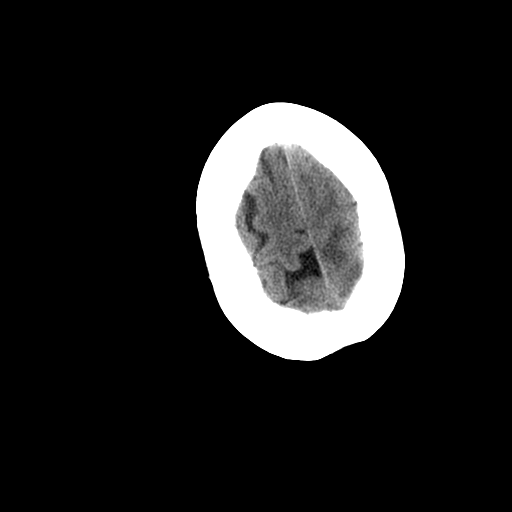
[im 35/38  brain]
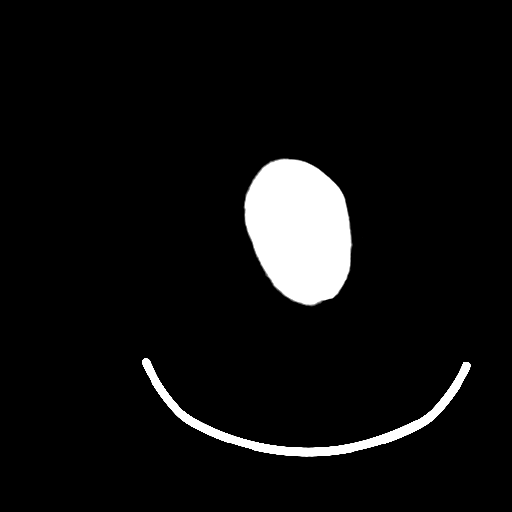
[im 35/38  bone]
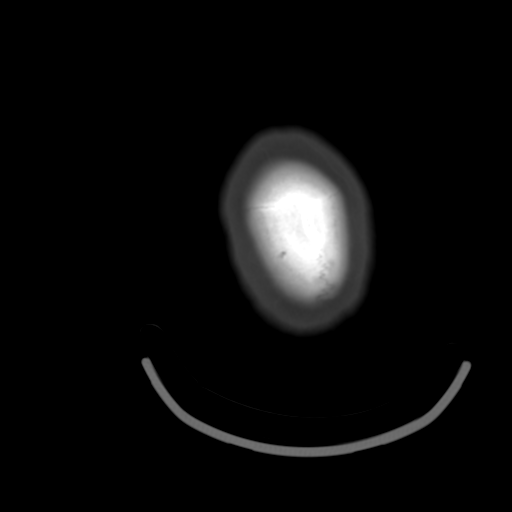

[13 of 30 positions shown; findings below may reference images not displayed]

FINDINGS: There is no significant change in the left frontal
parietal parenchymal hemorrhage.  No new areas of hemorrhage are
identified.  The surrounding vasogenic edema is not significantly
changed.  Minimal midline shift is stable.  White matter changes of
the high posterior right frontal lobe and parietal lobe are stable
as well.

The patient is intubated.  An NG tube is in place.  Frothy
secretions of the left maxillary sinus are again noted.  The
paranasal sinuses are otherwise stable.  The osseous skull is
intact.
IMPRESSION: 1.  Stable left frontal parietal parenchymal hemorrhage and
surrounding vasogenic edema.
2.  No significant change in minimal midline shift.

## 2011-02-17 ENCOUNTER — Ambulatory Visit: Payer: 59 | Admitting: Physical Therapy

## 2011-02-22 ENCOUNTER — Ambulatory Visit: Payer: 59 | Admitting: Physical Therapy

## 2011-02-22 ENCOUNTER — Encounter: Payer: Self-pay | Admitting: Cardiovascular Disease

## 2011-02-23 ENCOUNTER — Ambulatory Visit (INDEPENDENT_AMBULATORY_CARE_PROVIDER_SITE_OTHER): Payer: 59 | Admitting: Cardiovascular Disease

## 2011-02-23 ENCOUNTER — Encounter: Payer: Self-pay | Admitting: Cardiovascular Disease

## 2011-02-23 VITALS — BP 133/74 | HR 74 | Ht 62.0 in | Wt 147.0 lb

## 2011-02-23 DIAGNOSIS — R609 Edema, unspecified: Secondary | ICD-10-CM | POA: Insufficient documentation

## 2011-02-23 NOTE — Progress Notes (Signed)
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 She was admitted to [facility] March 2011 with CVA. She survived this after a prolonged course. 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 Echo march 2011 with normal LV size and function, EF 65-70%.   She is having swelling in the right leg. This leg was affected by the stroke. She has problems with walking secondary to foot drop and has a brace in place. Right leg edema does not resolve at night. Has been swollen and painful for several months. Reportedly with u/s at primary care office but not available for review. No left leg swelling or pain. No chest pain or SOB.   Past Medical History  Diagnosis Date  . Asthma   . COPD (chronic obstructive pulmonary disease)      - PFTs  01/24/06 FEV1 69% ratio 68% diffusing capacity 57% with no improvement after B2   - PFT's 10/1/ 09 FEV1 77   ratio  76    dlco                   45            no resp to B2    - Nl alpha one antitripsin level 10/09  . Hyperlipidemia     Past Surgical History  Procedure Date  . Laparoscopic hysterectomy   . Mandible fracture surgery     Current Outpatient Prescriptions  Medication Sig Dispense Refill  . albuterol (PROAIR HFA) 108 (90 BASE) MCG/ACT inhaler Inhale 2 puffs into the lungs every 6 (six) hours as needed.        Marland Kitchen albuterol (PROVENTIL) (2.5 MG/3ML) 0.083% nebulizer solution Take 2.5 mg by nebulization every 6 (six) hours as needed.        Marland Kitchen albuterol-ipratropium (COMBIVENT) 18-103 MCG/ACT inhaler Inhale 2 puffs into the lungs every 4 (four) hours as needed.        Marland Kitchen aspirin 81 MG EC tablet Take 81 mg by mouth  daily.        . budesonide-formoterol (SYMBICORT) 160-4.5 MCG/ACT inhaler Inhale 2 puffs into the lungs 2 (two) times daily.        . Cholecalciferol (VITAMIN D-3 PO) Take by mouth. One tab daily (OTC)      . citalopram (CELEXA) 10 MG tablet Take 40 mg by mouth daily.        . cyclobenzaprine (FLEXERIL) 10 MG tablet Take 10 mg by mouth 1 dose over 24 hours.        . famotidine (PEPCID) 20 MG tablet Take 20 mg by mouth 2 (two) times daily.        Marland Kitchen levETIRAcetam (KEPPRA) 500 MG tablet Take 500 mg by mouth every 12 (twelve) hours.        Marland Kitchen losartan (COZAAR) 100 MG tablet Take 100 mg by mouth daily.        Marland Kitchen omega-3 acid ethyl esters (LOVAZA) 1 G capsule Take 2 g by mouth daily.        . rosuvastatin (CRESTOR) 10 MG tablet Take 10 mg by mouth daily.        Marland Kitchen topiramate (TOPAMAX) 50 MG tablet  Take 50 mg by mouth.        . vitamin B-12 (CYANOCOBALAMIN) 250 MCG tablet Take 250 mcg by mouth daily.        . vitamin C (ASCORBIC ACID) 500 MG tablet Take 500 mg by mouth daily.        . fexofenadine (ALLEGRA) 180 MG tablet Take 180 mg by mouth daily.        Marland Kitchen levofloxacin (LEVAQUIN) 750 MG tablet Take 750 mg by mouth daily.        Marland Kitchen omeprazole-sodium bicarbonate (ZEGERID) 40-1100 MG per capsule Take 1 capsule by mouth daily before breakfast.        . predniSONE (DELTASONE) 10 MG tablet Take 10 mg by mouth as directed.        . valsartan-hydrochlorothiazide (DIOVAN-HCT) 160-12.5 MG per tablet Take 1 tablet by mouth daily.        Marland Kitchen DISCONTD: aspirin 325 MG tablet Take 325 mg by mouth daily.        Marland Kitchen DISCONTD: escitalopram (LEXAPRO) 20 MG tablet Take 20 mg by mouth daily.        Marland Kitchen DISCONTD: estrogens, conjugated, (PREMARIN) 0.625 MG tablet Take 0.625 mg by mouth daily. Take daily for 21 days then do not take for 7 days.       Marland Kitchen DISCONTD: gabapentin (NEURONTIN) 300 MG capsule Take 300 mg by mouth 3 (three) times daily.        Marland Kitchen DISCONTD: L-Methylfolate (DEPLIN) 15 MG TABS Take by mouth daily.           Allergies  Allergen Reactions  . Phenytoin     History   Social History  . Marital Status: Married    Spouse Name: N/A    Number of Children: N/A  . Years of Education: N/A   Occupational History  . Not on file.   Social History Main Topics  . Smoking status: Former Smoker    Quit date: 11/27/2009  . Smokeless tobacco: Not on file  . Alcohol Use: Not on file  . Drug Use: Not on file  . Sexually Active: Not on file   Other Topics Concern  . Not on file   Social History Narrative  . No narrative on file    Family History  Problem Relation Age of Onset  . Heart disease Father   . Lung cancer      Review of Systems:  As stated in the HPI and otherwise negative.   BP 133/74  Pulse 74  Ht 5\' 2"  (1.575 m)  Wt 147 lb (66.679 kg)  BMI 26.89 kg/m2  Physical Examination: General: Well developed, well nourished, NAD HEENT: OP clear, mucus membranes moist SKIN: warm, dry. No rashes. Neuro: No focal deficits Musculoskeletal: Muscle strength 5/5 all ext Psychiatric: Mood and affect normal Neck: No JVD, no carotid bruits, no thyromegaly, no lymphadenopathy. Lungs:Clear bilaterally, no wheezes, rhonci, crackles Cardiovascular: Regular rate and rhythm. No murmurs, gallops or rubs. Abdomen:Soft. Bowel sounds present. Non-tender.  Extremities: No lower extremity edema. Pulses are 2 + in the bilateral DP/PT.  EKG:02/16/11: NSR, rate 75 bpm.

## 2011-02-23 NOTE — Assessment & Plan Note (Signed)
Unilateral right lower extremity swelling. This is most likely related to her inability to use her right leg due to her stroke. She had a recent venous u/s. We will try to get from the outside office. If negative for DVT, probably does not need further workup. If u/s not done we can perform here in our office. This is likely not cardiac related. Echo March 2011 with normal LV size and function. Edema from CHF would be bilateral and her swelling is unilateral. Low suspicion for DVT but will have to be excluded. Will get records and call pt to discuss further testing.

## 2011-02-24 ENCOUNTER — Ambulatory Visit: Payer: 59 | Admitting: Physical Therapy

## 2011-02-24 ENCOUNTER — Telehealth: Payer: Self-pay | Admitting: Cardiology

## 2011-02-24 DIAGNOSIS — R609 Edema, unspecified: Secondary | ICD-10-CM

## 2011-02-24 NOTE — Telephone Encounter (Signed)
Patient needs LE venous duplex of her right leg to rule out a DVT.

## 2011-02-24 NOTE — Progress Notes (Signed)
Addended by: Ellender Hose on: 02/24/2011 08:15 AM   Modules accepted: Orders

## 2011-02-28 ENCOUNTER — Ambulatory Visit: Payer: 59 | Admitting: Physical Therapy

## 2011-03-02 ENCOUNTER — Ambulatory Visit: Payer: 59 | Admitting: Physical Therapy

## 2011-03-08 ENCOUNTER — Ambulatory Visit: Payer: 59 | Attending: Neurology | Admitting: Physical Therapy

## 2011-03-08 DIAGNOSIS — R269 Unspecified abnormalities of gait and mobility: Secondary | ICD-10-CM | POA: Insufficient documentation

## 2011-03-08 DIAGNOSIS — Z5189 Encounter for other specified aftercare: Secondary | ICD-10-CM | POA: Insufficient documentation

## 2011-03-08 DIAGNOSIS — M2550 Pain in unspecified joint: Secondary | ICD-10-CM | POA: Insufficient documentation

## 2011-03-08 DIAGNOSIS — I69998 Other sequelae following unspecified cerebrovascular disease: Secondary | ICD-10-CM | POA: Insufficient documentation

## 2011-03-08 DIAGNOSIS — R279 Unspecified lack of coordination: Secondary | ICD-10-CM | POA: Insufficient documentation

## 2011-03-08 DIAGNOSIS — R5381 Other malaise: Secondary | ICD-10-CM | POA: Insufficient documentation

## 2011-03-08 DIAGNOSIS — M6281 Muscle weakness (generalized): Secondary | ICD-10-CM | POA: Insufficient documentation

## 2011-03-11 ENCOUNTER — Ambulatory Visit: Payer: 59 | Admitting: Physical Therapy

## 2011-03-14 ENCOUNTER — Encounter (INDEPENDENT_AMBULATORY_CARE_PROVIDER_SITE_OTHER): Payer: 59 | Admitting: Cardiology

## 2011-03-14 ENCOUNTER — Ambulatory Visit: Payer: 59 | Admitting: Physical Therapy

## 2011-03-14 DIAGNOSIS — R609 Edema, unspecified: Secondary | ICD-10-CM

## 2011-03-14 DIAGNOSIS — M7989 Other specified soft tissue disorders: Secondary | ICD-10-CM

## 2011-03-16 ENCOUNTER — Ambulatory Visit: Payer: 59 | Admitting: Rehabilitative and Restorative Service Providers"

## 2011-03-21 ENCOUNTER — Ambulatory Visit: Payer: 59 | Admitting: Rehabilitative and Restorative Service Providers"

## 2011-03-23 ENCOUNTER — Ambulatory Visit: Payer: 59 | Admitting: Rehabilitative and Restorative Service Providers"

## 2011-03-28 ENCOUNTER — Ambulatory Visit: Payer: 59 | Admitting: Rehabilitative and Restorative Service Providers"

## 2011-03-30 ENCOUNTER — Ambulatory Visit: Payer: 59 | Admitting: Rehabilitative and Restorative Service Providers"

## 2011-04-29 ENCOUNTER — Other Ambulatory Visit (HOSPITAL_COMMUNITY): Payer: Self-pay | Admitting: Interventional Radiology

## 2011-04-29 DIAGNOSIS — I729 Aneurysm of unspecified site: Secondary | ICD-10-CM

## 2011-05-12 ENCOUNTER — Ambulatory Visit: Payer: 59 | Attending: Internal Medicine | Admitting: Rehabilitative and Restorative Service Providers"

## 2011-05-12 DIAGNOSIS — IMO0001 Reserved for inherently not codable concepts without codable children: Secondary | ICD-10-CM | POA: Insufficient documentation

## 2011-05-12 DIAGNOSIS — I69998 Other sequelae following unspecified cerebrovascular disease: Secondary | ICD-10-CM | POA: Insufficient documentation

## 2011-05-12 DIAGNOSIS — R269 Unspecified abnormalities of gait and mobility: Secondary | ICD-10-CM | POA: Insufficient documentation

## 2011-05-12 DIAGNOSIS — M6281 Muscle weakness (generalized): Secondary | ICD-10-CM | POA: Insufficient documentation

## 2011-05-25 ENCOUNTER — Other Ambulatory Visit (HOSPITAL_COMMUNITY): Payer: Self-pay | Admitting: Interventional Radiology

## 2011-05-25 ENCOUNTER — Ambulatory Visit (HOSPITAL_COMMUNITY)
Admission: RE | Admit: 2011-05-25 | Discharge: 2011-05-25 | Disposition: A | Discharge status: Home / Self Care | Payer: 59 | Source: Ambulatory Visit | Attending: Interventional Radiology | Admitting: Interventional Radiology

## 2011-05-25 DIAGNOSIS — I10 Essential (primary) hypertension: Secondary | ICD-10-CM | POA: Insufficient documentation

## 2011-05-25 DIAGNOSIS — M545 Low back pain, unspecified: Secondary | ICD-10-CM | POA: Insufficient documentation

## 2011-05-25 DIAGNOSIS — F3289 Other specified depressive episodes: Secondary | ICD-10-CM | POA: Insufficient documentation

## 2011-05-25 DIAGNOSIS — F329 Major depressive disorder, single episode, unspecified: Secondary | ICD-10-CM | POA: Insufficient documentation

## 2011-05-25 DIAGNOSIS — I729 Aneurysm of unspecified site: Secondary | ICD-10-CM

## 2011-05-25 DIAGNOSIS — Z87891 Personal history of nicotine dependence: Secondary | ICD-10-CM | POA: Insufficient documentation

## 2011-05-25 DIAGNOSIS — J449 Chronic obstructive pulmonary disease, unspecified: Secondary | ICD-10-CM | POA: Insufficient documentation

## 2011-05-25 DIAGNOSIS — I671 Cerebral aneurysm, nonruptured: Secondary | ICD-10-CM | POA: Insufficient documentation

## 2011-05-25 DIAGNOSIS — G8929 Other chronic pain: Secondary | ICD-10-CM | POA: Insufficient documentation

## 2011-05-25 DIAGNOSIS — R51 Headache: Secondary | ICD-10-CM | POA: Insufficient documentation

## 2011-05-25 DIAGNOSIS — Z8673 Personal history of transient ischemic attack (TIA), and cerebral infarction without residual deficits: Secondary | ICD-10-CM | POA: Insufficient documentation

## 2011-05-25 DIAGNOSIS — J4489 Other specified chronic obstructive pulmonary disease: Secondary | ICD-10-CM | POA: Insufficient documentation

## 2011-05-25 DIAGNOSIS — E538 Deficiency of other specified B group vitamins: Secondary | ICD-10-CM | POA: Insufficient documentation

## 2011-05-25 LAB — CBC
HCT: 39.3 % (ref 36.0–46.0)
Hemoglobin: 12.8 g/dL (ref 12.0–15.0)
MCH: 29.4 pg (ref 26.0–34.0)
MCHC: 32.6 g/dL (ref 30.0–36.0)
MCV: 90.3 fL (ref 78.0–100.0)
Platelets: 187 10*3/uL (ref 150–400)
RBC: 4.35 MIL/uL (ref 3.87–5.11)
RDW: 14.2 % (ref 11.5–15.5)
WBC: 6.8 10*3/uL (ref 4.0–10.5)

## 2011-05-25 LAB — POCT I-STAT, CHEM 8
BUN: 18 mg/dL (ref 6–23)
Calcium, Ion: 1.23 mmol/L (ref 1.12–1.32)
Chloride: 109 mEq/L (ref 96–112)
Creatinine, Ser: 0.9 mg/dL (ref 0.50–1.10)
Glucose, Bld: 91 mg/dL (ref 70–99)
HCT: 40 % (ref 36.0–46.0)
Hemoglobin: 13.6 g/dL (ref 12.0–15.0)
Potassium: 3.7 mEq/L (ref 3.5–5.1)
Sodium: 142 mEq/L (ref 135–145)
TCO2: 22 mmol/L (ref 0–100)

## 2011-05-25 LAB — APTT: aPTT: 33 seconds (ref 24–37)

## 2011-05-25 LAB — PROTIME-INR
INR: 1 (ref 0.00–1.49)
Prothrombin Time: 13.4 seconds (ref 11.6–15.2)

## 2011-05-25 MED ORDER — IOHEXOL 300 MG/ML  SOLN
150.0000 mL | Freq: Once | INTRAMUSCULAR | Status: AC | PRN
  Administered 2011-05-25: 55 mL via INTRA_ARTERIAL

## 2011-08-10 ENCOUNTER — Other Ambulatory Visit: Payer: Self-pay | Admitting: Internal Medicine

## 2011-08-10 DIAGNOSIS — R921 Mammographic calcification found on diagnostic imaging of breast: Secondary | ICD-10-CM

## 2011-08-20 ENCOUNTER — Encounter (HOSPITAL_COMMUNITY): Payer: Self-pay

## 2011-08-20 ENCOUNTER — Emergency Department (HOSPITAL_COMMUNITY): Payer: 59

## 2011-08-20 ENCOUNTER — Other Ambulatory Visit: Payer: Self-pay

## 2011-08-20 ENCOUNTER — Emergency Department (HOSPITAL_COMMUNITY)
Admission: EM | Admit: 2011-08-20 | Discharge: 2011-08-20 | Disposition: A | Discharge status: Home / Self Care | Payer: 59 | Attending: Emergency Medicine | Admitting: Emergency Medicine

## 2011-08-20 DIAGNOSIS — N390 Urinary tract infection, site not specified: Secondary | ICD-10-CM | POA: Insufficient documentation

## 2011-08-20 DIAGNOSIS — R51 Headache: Secondary | ICD-10-CM | POA: Insufficient documentation

## 2011-08-20 DIAGNOSIS — N201 Calculus of ureter: Secondary | ICD-10-CM | POA: Insufficient documentation

## 2011-08-20 DIAGNOSIS — R109 Unspecified abdominal pain: Secondary | ICD-10-CM | POA: Insufficient documentation

## 2011-08-20 DIAGNOSIS — N200 Calculus of kidney: Secondary | ICD-10-CM

## 2011-08-20 HISTORY — DX: Depression, unspecified: F32.A

## 2011-08-20 HISTORY — DX: Aneurysm of unspecified site: I72.9

## 2011-08-20 HISTORY — DX: Cerebral infarction, unspecified: I63.9

## 2011-08-20 HISTORY — DX: Anxiety disorder, unspecified: F41.9

## 2011-08-20 HISTORY — DX: Major depressive disorder, single episode, unspecified: F32.9

## 2011-08-20 LAB — URINALYSIS, ROUTINE W REFLEX MICROSCOPIC
Bilirubin Urine: NEGATIVE
Glucose, UA: NEGATIVE mg/dL
Hgb urine dipstick: NEGATIVE
Ketones, ur: 15 mg/dL — AB
Nitrite: NEGATIVE
Protein, ur: NEGATIVE mg/dL
Specific Gravity, Urine: 1.02 (ref 1.005–1.030)
Urobilinogen, UA: 0.2 mg/dL (ref 0.0–1.0)
pH: 6 (ref 5.0–8.0)

## 2011-08-20 LAB — URINE MICROSCOPIC-ADD ON

## 2011-08-20 MED ORDER — ONDANSETRON HCL 4 MG/2ML IJ SOLN
4.0000 mg | Freq: Once | INTRAMUSCULAR | Status: AC
  Administered 2011-08-20: 4 mg via INTRAVENOUS
  Filled 2011-08-20: qty 2

## 2011-08-20 MED ORDER — HYDROMORPHONE HCL PF 1 MG/ML IJ SOLN
0.5000 mg | Freq: Once | INTRAMUSCULAR | Status: AC
  Administered 2011-08-20: 0.5 mg via INTRAVENOUS
  Filled 2011-08-20: qty 1

## 2011-08-20 MED ORDER — SODIUM CHLORIDE 0.9 % IV SOLN
Freq: Once | INTRAVENOUS | Status: AC
  Administered 2011-08-20: 15:00:00 via INTRAVENOUS

## 2011-08-20 MED ORDER — SULFAMETHOXAZOLE-TRIMETHOPRIM 800-160 MG PO TABS: 1.0000 | ORAL_TABLET | Freq: Two times a day (BID) | ORAL | Status: AC

## 2011-08-20 MED ORDER — OXYCODONE-ACETAMINOPHEN 5-325 MG PO TABS: 1.0000 | ORAL_TABLET | ORAL | Status: AC | PRN

## 2011-08-20 MED ORDER — ONDANSETRON 8 MG PO TBDP: 8.0000 mg | ORAL_TABLET | Freq: Three times a day (TID) | ORAL | Status: AC | PRN

## 2011-08-20 NOTE — ED Notes (Signed)
ZOX:[id number]<AV> Expected date:08/20/11<BR> Expected time: 1:01 PM<BR> Means of arrival:Ambulance<BR> Comments:<BR> EMS [facility] - back/flank pain/n/v

## 2011-08-20 NOTE — ED Notes (Signed)
Pt brought in by [facility] with c/o back/ right flank pain with headache x 1 week and made worse by N/V x 1 today.

## 2011-08-20 NOTE — ED Provider Notes (Signed)
History     CSN: [id number] Arrival date & time: 08/20/2011  1:33 PM   First MD Initiated Contact with Patient 08/20/11 1412      Chief Complaint  Patient presents with  . Back Pain  . Flank Pain  . Migraine  . Nausea  . Emesis    (Consider location/radiation/quality/duration/timing/severity/associated sxs/prior treatment) Patient is a 55 y.o. female presenting with flank pain, migraine, and vomiting. The history is provided by the patient and the spouse.  Flank Pain This is a new problem. The current episode started 1 to 4 weeks ago. The problem occurs intermittently. The problem has been gradually worsening (Right flank pain started one week ago and radiated to right lower abdomen and groin. No history of kidney stones. ). Associated symptoms include abdominal pain and vomiting. Pertinent negatives include no fever. The symptoms are aggravated by nothing. She has tried nothing for the symptoms.  Migraine Associated symptoms include abdominal pain and vomiting. Pertinent negatives include no fever.  Emesis  Associated symptoms include abdominal pain. Pertinent negatives include no fever.    Past Medical History  Diagnosis Date  . Asthma   . COPD (chronic obstructive pulmonary disease)      - PFTs  01/24/06 FEV1 69% ratio 68% diffusing capacity 57% with no improvement after B2   - PFT's 10/1/ 09 FEV1 77   ratio  76    dlco                   45            no resp to B2    - Nl alpha one antitripsin level 10/09  . Hyperlipidemia   . Stroke   . Aneurysm   . Depression   . Anxiety     Past Surgical History  Procedure Date  . Laparoscopic hysterectomy   . Mandible fracture surgery   . Back surgery     Family History  Problem Relation Age of Onset  . Heart disease Father   . Heart attack Father   . Lung cancer      History  Substance Use Topics  . Smoking status: Former Smoker    Quit date: 11/27/2009  . Smokeless tobacco: Not on file  . Alcohol Use: No    OB  History    Grav Para Term Preterm Abortions TAB SAB Ect Mult Living                  Review of Systems  Constitutional: Negative.  Negative for fever.  Respiratory: Negative.   Cardiovascular: Negative.   Gastrointestinal: Positive for vomiting and abdominal pain.  Genitourinary: Positive for dysuria and flank pain. Negative for hematuria.  Skin: Negative.     Allergies  Phenytoin  Home Medications   Current Outpatient Rx  Name Route Sig Dispense Refill  . ALPRAZOLAM 0.25 MG PO TABS Oral Take 0.25 mg by mouth every 6 (six) hours as needed. For anxiety     . ASPIRIN 81 MG PO TBEC Oral Take 81 mg by mouth daily.      Marland Kitchen VITAMIN D-3 PO Oral Take by mouth. One tab daily (OTC)    . CITALOPRAM HYDROBROMIDE 10 MG PO TABS Oral Take 40 mg by mouth daily.      Marland Kitchen CRANBERRY 1000 MG PO CAPS Oral Take 4,200 mg by mouth daily.      . CYCLOBENZAPRINE HCL 10 MG PO TABS Oral Take 10 mg by mouth at bedtime.     Marland Kitchen  FAMOTIDINE 20 MG PO TABS Oral Take 20 mg by mouth 2 (two) times daily.      Marland Kitchen FEXOFENADINE HCL 180 MG PO TABS Oral Take 180 mg by mouth daily.      Marland Kitchen LEVETIRACETAM 500 MG PO TABS Oral Take 500 mg by mouth every 12 (twelve) hours.      Marland Kitchen LOSARTAN POTASSIUM 100 MG PO TABS Oral Take 100 mg by mouth daily.      . OMEGA-3-ACID ETHYL ESTERS 1 G PO CAPS Oral Take 2 g by mouth daily.      Marland Kitchen ROSUVASTATIN CALCIUM 10 MG PO TABS Oral Take 10 mg by mouth daily.      Marland Kitchen VITAMIN B-12 250 MCG PO TABS Oral Take 250 mcg by mouth daily.      Marland Kitchen VITAMIN C 500 MG PO TABS Oral Take 500 mg by mouth daily.      . ALBUTEROL SULFATE HFA 108 (90 BASE) MCG/ACT IN AERS Inhalation Inhale 2 puffs into the lungs every 6 (six) hours as needed.      . ALBUTEROL SULFATE (2.5 MG/3ML) 0.083% IN NEBU Nebulization Take 2.5 mg by nebulization every 6 (six) hours as needed.      [name] 18-103 MCG/ACT IN AERO Inhalation Inhale 2 puffs into the lungs every 4 (four) hours as needed.      . BUDESONIDE-FORMOTEROL  FUMARATE 160-4.5 MCG/ACT IN AERO Inhalation Inhale 2 puffs into the lungs 2 (two) times daily.        BP 113/66  Pulse 63  Temp(Src) 98.5 F (36.9 C) (Oral)  Resp 15  Ht 5\' 2"  (1.575 m)  Wt 145 lb (65.772 kg)  BMI 26.52 kg/m2  SpO2 100%  Physical Exam  Constitutional: She appears well-developed and well-nourished.       Appears uncomfortable.  HENT:  Head: Normocephalic.  Neck: Normal range of motion. Neck supple.  Cardiovascular: Normal rate and regular rhythm.   Pulmonary/Chest: Effort normal and breath sounds normal.  Abdominal: Soft. Bowel sounds are normal. There is no tenderness. There is no rebound and no guarding.  Genitourinary:       No reproducible right flank pain.  Musculoskeletal: Normal range of motion.  Neurological: She is alert. No cranial nerve deficit.  Skin: Skin is warm and dry. No rash noted.  Psychiatric: She has a normal mood and affect.    ED Course  Procedures (including critical care time)  Labs Reviewed - No data to display No results found.   No diagnosis found.    MDM  D/W Dr. Vernie Ammons - recommends Septra DS and follow up in office early this week.         Rodena Medin, PA 08/20/11 [phone]

## 2011-08-20 NOTE — ED Notes (Signed)
Pt given discharge instructions and assisted to discharge window in stroller

## 2011-08-22 NOTE — ED Provider Notes (Signed)
Medical screening examination/treatment/procedure(s) were performed by non-physician practitioner and as supervising physician I was immediately available for consultation/collaboration.  Latwan Luchsinger R. Kahner Yanik, MD 08/22/11 2350 

## 2011-08-23 ENCOUNTER — Ambulatory Visit
Admission: RE | Admit: 2011-08-23 | Discharge: 2011-08-23 | Disposition: A | Discharge status: Home / Self Care | Payer: 59 | Source: Ambulatory Visit | Attending: Internal Medicine | Admitting: Internal Medicine

## 2011-08-23 DIAGNOSIS — R921 Mammographic calcification found on diagnostic imaging of breast: Secondary | ICD-10-CM

## 2011-10-26 ENCOUNTER — Ambulatory Visit: Payer: 59 | Attending: Neurology | Admitting: Rehabilitative and Restorative Service Providers"

## 2011-10-26 DIAGNOSIS — R269 Unspecified abnormalities of gait and mobility: Secondary | ICD-10-CM | POA: Insufficient documentation

## 2011-10-26 DIAGNOSIS — I69998 Other sequelae following unspecified cerebrovascular disease: Secondary | ICD-10-CM | POA: Insufficient documentation

## 2011-10-26 DIAGNOSIS — IMO0001 Reserved for inherently not codable concepts without codable children: Secondary | ICD-10-CM | POA: Insufficient documentation

## 2011-10-31 ENCOUNTER — Ambulatory Visit: Payer: 59 | Admitting: Rehabilitative and Restorative Service Providers"

## 2011-11-03 ENCOUNTER — Ambulatory Visit: Payer: 59 | Admitting: Rehabilitative and Restorative Service Providers"

## 2011-11-09 ENCOUNTER — Ambulatory Visit: Payer: 59 | Attending: Neurology | Admitting: Rehabilitative and Restorative Service Providers"

## 2011-11-09 DIAGNOSIS — I69998 Other sequelae following unspecified cerebrovascular disease: Secondary | ICD-10-CM | POA: Insufficient documentation

## 2011-11-09 DIAGNOSIS — IMO0001 Reserved for inherently not codable concepts without codable children: Secondary | ICD-10-CM | POA: Insufficient documentation

## 2011-11-11 ENCOUNTER — Ambulatory Visit: Payer: 59 | Admitting: Rehabilitative and Restorative Service Providers"

## 2011-11-14 ENCOUNTER — Ambulatory Visit: Payer: 59 | Admitting: Rehabilitative and Restorative Service Providers"

## 2011-11-18 ENCOUNTER — Ambulatory Visit: Payer: 59 | Admitting: Rehabilitative and Restorative Service Providers"

## 2011-11-23 ENCOUNTER — Ambulatory Visit: Payer: 59 | Admitting: Rehabilitative and Restorative Service Providers"

## 2011-11-25 ENCOUNTER — Ambulatory Visit: Payer: 59 | Admitting: Rehabilitative and Restorative Service Providers"

## 2012-04-16 ENCOUNTER — Other Ambulatory Visit (HOSPITAL_COMMUNITY): Payer: Self-pay | Admitting: Interventional Radiology

## 2012-04-16 DIAGNOSIS — Z09 Encounter for follow-up examination after completed treatment for conditions other than malignant neoplasm: Secondary | ICD-10-CM

## 2012-04-16 DIAGNOSIS — I729 Aneurysm of unspecified site: Secondary | ICD-10-CM

## 2012-04-16 DIAGNOSIS — I639 Cerebral infarction, unspecified: Secondary | ICD-10-CM

## 2012-05-29 ENCOUNTER — Ambulatory Visit (HOSPITAL_COMMUNITY): Payer: 59

## 2012-05-31 ENCOUNTER — Other Ambulatory Visit: Payer: Self-pay | Admitting: Radiology

## 2012-06-05 ENCOUNTER — Other Ambulatory Visit: Payer: Self-pay | Admitting: Radiology

## 2012-06-05 ENCOUNTER — Encounter (HOSPITAL_COMMUNITY): Payer: Self-pay | Admitting: Pharmacy Technician

## 2012-06-05 NOTE — Addendum Note (Signed)
Addended by: Gretchen Short on: 06/05/2012 11:35 AM   Modules accepted: Orders

## 2012-06-08 ENCOUNTER — Other Ambulatory Visit (HOSPITAL_COMMUNITY): Payer: Self-pay | Admitting: Interventional Radiology

## 2012-06-08 ENCOUNTER — Ambulatory Visit (HOSPITAL_COMMUNITY)
Admission: RE | Admit: 2012-06-08 | Discharge: 2012-06-08 | Disposition: A | Discharge status: Home / Self Care | Payer: 59 | Source: Ambulatory Visit | Attending: Interventional Radiology | Admitting: Interventional Radiology

## 2012-06-08 ENCOUNTER — Encounter (HOSPITAL_COMMUNITY): Payer: Self-pay

## 2012-06-08 DIAGNOSIS — I671 Cerebral aneurysm, nonruptured: Secondary | ICD-10-CM | POA: Insufficient documentation

## 2012-06-08 DIAGNOSIS — Z8673 Personal history of transient ischemic attack (TIA), and cerebral infarction without residual deficits: Secondary | ICD-10-CM | POA: Insufficient documentation

## 2012-06-08 DIAGNOSIS — Z09 Encounter for follow-up examination after completed treatment for conditions other than malignant neoplasm: Secondary | ICD-10-CM

## 2012-06-08 DIAGNOSIS — J4489 Other specified chronic obstructive pulmonary disease: Secondary | ICD-10-CM | POA: Insufficient documentation

## 2012-06-08 DIAGNOSIS — I729 Aneurysm of unspecified site: Secondary | ICD-10-CM

## 2012-06-08 DIAGNOSIS — R51 Headache: Secondary | ICD-10-CM | POA: Insufficient documentation

## 2012-06-08 DIAGNOSIS — J449 Chronic obstructive pulmonary disease, unspecified: Secondary | ICD-10-CM | POA: Insufficient documentation

## 2012-06-08 DIAGNOSIS — I639 Cerebral infarction, unspecified: Secondary | ICD-10-CM

## 2012-06-08 LAB — APTT: aPTT: 38 seconds — ABNORMAL HIGH (ref 24–37)

## 2012-06-08 LAB — CBC WITH DIFFERENTIAL/PLATELET
Basophils Absolute: 0 10*3/uL (ref 0.0–0.1)
Basophils Relative: 0 % (ref 0–1)
Eosinophils Absolute: 0.2 10*3/uL (ref 0.0–0.7)
Eosinophils Relative: 3 % (ref 0–5)
HCT: 35 % — ABNORMAL LOW (ref 36.0–46.0)
Hemoglobin: 11.3 g/dL — ABNORMAL LOW (ref 12.0–15.0)
Lymphocytes Relative: 42 % (ref 12–46)
Lymphs Abs: 2.2 10*3/uL (ref 0.7–4.0)
MCH: 30.1 pg (ref 26.0–34.0)
MCHC: 32.3 g/dL (ref 30.0–36.0)
MCV: 93.1 fL (ref 78.0–100.0)
Monocytes Absolute: 0.5 10*3/uL (ref 0.1–1.0)
Monocytes Relative: 9 % (ref 3–12)
Neutro Abs: 2.4 10*3/uL (ref 1.7–7.7)
Neutrophils Relative %: 46 % (ref 43–77)
Platelets: 157 10*3/uL (ref 150–400)
RBC: 3.76 MIL/uL — ABNORMAL LOW (ref 3.87–5.11)
RDW: 13.7 % (ref 11.5–15.5)
WBC: 5.3 10*3/uL (ref 4.0–10.5)

## 2012-06-08 LAB — PROTIME-INR
INR: 0.99 (ref 0.00–1.49)
Prothrombin Time: 13 seconds (ref 11.6–15.2)

## 2012-06-08 LAB — BASIC METABOLIC PANEL
BUN: 16 mg/dL (ref 6–23)
CO2: 27 mEq/L (ref 19–32)
Calcium: 9.3 mg/dL (ref 8.4–10.5)
Chloride: 107 mEq/L (ref 96–112)
Creatinine, Ser: 0.64 mg/dL (ref 0.50–1.10)
GFR calc Af Amer: >90 mL/min (ref >90)
GFR calc non Af Amer: >90 mL/min (ref >90)
Glucose, Bld: 95 mg/dL (ref 70–99)
Potassium: 4 mEq/L (ref 3.5–5.1)
Sodium: 140 mEq/L (ref 135–145)

## 2012-06-08 MED ORDER — IOHEXOL 300 MG/ML  SOLN
150.0000 mL | Freq: Once | INTRAMUSCULAR | Status: AC | PRN
  Administered 2012-06-08: 72 mL via INTRA_ARTERIAL

## 2012-06-08 MED ORDER — MIDAZOLAM HCL 2 MG/2ML IJ SOLN
INTRAMUSCULAR | Status: AC
  Filled 2012-06-08: qty 2

## 2012-06-08 MED ORDER — SODIUM CHLORIDE 0.9 % IV SOLN
Freq: Once | INTRAVENOUS | Status: AC
  Administered 2012-06-08: 07:00:00 via INTRAVENOUS

## 2012-06-08 MED ORDER — HYDROCODONE-ACETAMINOPHEN 5-325 MG PO TABS
1.0000 | ORAL_TABLET | Freq: Once | ORAL | Status: AC
  Administered 2012-06-08: 1 via ORAL

## 2012-06-08 MED ORDER — FENTANYL CITRATE 0.05 MG/ML IJ SOLN
INTRAMUSCULAR | Status: AC | PRN
  Administered 2012-06-08: 25 ug via INTRAVENOUS

## 2012-06-08 MED ORDER — MIDAZOLAM HCL 5 MG/5ML IJ SOLN
INTRAMUSCULAR | Status: AC | PRN
  Administered 2012-06-08: 1 mg via INTRAVENOUS

## 2012-06-08 MED ORDER — HEPARIN SOD (PORK) LOCK FLUSH 100 UNIT/ML IV SOLN
INTRAVENOUS | Status: AC | PRN
  Administered 2012-06-08 (×2): 500 [IU] via INTRAVENOUS

## 2012-06-08 MED ORDER — FENTANYL CITRATE 0.05 MG/ML IJ SOLN
INTRAMUSCULAR | Status: AC
  Filled 2012-06-08: qty 2

## 2012-06-08 MED ORDER — SODIUM CHLORIDE 0.9 % IV SOLN: INTRAVENOUS | Status: AC

## 2012-06-08 MED ORDER — HYDROCODONE-ACETAMINOPHEN 5-325 MG PO TABS
ORAL_TABLET | ORAL | Status: AC
  Administered 2012-06-08: 1 via ORAL
  Filled 2012-06-08: qty 1

## 2012-06-08 NOTE — H&P (Signed)
[name] is an 56 y.o. female.   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 Asthma   . COPD (chronic obstructive pulmonary disease)      - PFTs  01/24/06 FEV1 69% ratio 68% diffusing capacity 57% with no improvement after B2   - PFT's 10/1/ 09 FEV1 77   ratio  76    dlco                   45            no resp to B2    - Nl alpha one antitripsin level 10/09  . Hyperlipidemia   . Stroke   . Aneurysm   . Depression   . Anxiety     Past Surgical History  Procedure Date  . Laparoscopic hysterectomy   . Mandible fracture surgery   . Back surgery     Family History  Problem Relation Age of Onset  . Heart disease Father   . Heart attack Father   . Lung cancer     Social History:  reports that she quit smoking about 2 years ago. She does not have any smokeless tobacco history on file. She reports that she does not drink alcohol. Her drug history not on file.  Allergies:  Allergies  Allergen Reactions  . Phenytoin Hives and Swelling     (Not in a hospital admission)  Results for orders placed during the hospital encounter of 06/08/12 (from the past 48 hour(s))  APTT     Status: Abnormal   Collection Time   06/08/12  6:57 AM      Component Value Range Comment   aPTT 38 (*) 24 - 37 seconds   BASIC METABOLIC PANEL     Status: Normal   Collection Time   06/08/12  6:57 AM      Component Value Range Comment   Sodium 140  135 - 145 mEq/L    Potassium 4.0  3.5 - 5.1 mEq/L    Chloride 107  96 - 112 mEq/L    CO2 27  19 - 32 mEq/L    Glucose, Bld 95  70 - 99 mg/dL      BUN 16  6 - 23 mg/dL    Creatinine, Ser 4.54  0.50 - 1.10 mg/dL    Calcium 9.3  8.4 - 09.8 mg/dL    GFR calc non Af Amer >90  >90 mL/min    GFR calc Af Amer >90  >90 mL/min   CBC WITH DIFFERENTIAL     Status: Abnormal   Collection Time   06/08/12  6:57 AM      Component Value Range Comment   WBC 5.3  4.0 - 10.5 K/uL    RBC 3.76 (*) 3.87 - 5.11 MIL/uL    Hemoglobin 11.3 (*) 12.0 - 15.0 g/dL    HCT 11.9 (*) 14.7 - 46.0 %    MCV 93.1  78.0 - 100.0 fL    MCH 30.1  26.0 - 34.0 pg    MCHC 32.3  30.0 - 36.0 g/dL    RDW 82.9  56.2 - 13.0 %  Platelets 157  150 - 400 K/uL    Neutrophils Relative 46  43 - 77 %    Neutro Abs 2.4  1.7 - 7.7 K/uL    Lymphocytes Relative 42  12 - 46 %    Lymphs Abs 2.2  0.7 - 4.0 K/uL    Monocytes Relative 9  3 - 12 %    Monocytes Absolute 0.5  0.1 - 1.0 K/uL    Eosinophils Relative 3  0 - 5 %    Eosinophils Absolute 0.2  0.0 - 0.7 K/uL    Basophils Relative 0  0 - 1 %    Basophils Absolute 0.0  0.0 - 0.1 K/uL   PROTIME-INR     Status: Normal   Collection Time   06/08/12  6:57 AM      Component Value Range Comment   Prothrombin Time 13.0  11.6 - 15.2 seconds    INR 0.99  0.00 - 1.49    No results found.  Review of Systems  Constitutional: Negative for fever and weight loss.  HENT: Negative for tinnitus.   Respiratory: Negative for cough.   Cardiovascular: Negative for chest pain.  Gastrointestinal: Negative for nausea, vomiting and abdominal pain.  Musculoskeletal: Negative for back pain.  Neurological: Positive for focal weakness, weakness and headaches.    There were no vitals taken for this visit. Physical Exam  Constitutional: She is oriented to person, place, and time. She appears well-developed and well-nourished.  HENT:       Left facial lip droop - pt says has always been there; even before cva  Eyes: EOM are normal.  Cardiovascular: Normal rate, regular rhythm and normal heart sounds.   No murmur heard. Respiratory: Effort normal  and breath sounds normal. She has no wheezes.  GI: Soft. Bowel sounds are normal. There is no tenderness.  Musculoskeletal: Normal range of motion.       Rt side weakness; uses cane  Neurological: She is alert and oriented to person, place, and time.  Psychiatric: She has a normal mood and affect. Her behavior is normal. Judgment and thought content normal.     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

## 2012-06-08 NOTE — Procedures (Signed)
S/P 4 vessle cerebral arteriogram R5t CFA approach  Preliminary findings . 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

## 2012-06-11 ENCOUNTER — Telehealth (HOSPITAL_COMMUNITY): Payer: Self-pay | Admitting: *Deleted

## 2012-06-11 NOTE — Telephone Encounter (Signed)
Post procedure follow up call attempt.  No answer, VM left with instructions to return call with any questions or concerns.

## 2012-06-29 DIAGNOSIS — N390 Urinary tract infection, site not specified: Secondary | ICD-10-CM | POA: Diagnosis not present

## 2012-06-29 DIAGNOSIS — N3020 Other chronic cystitis without hematuria: Secondary | ICD-10-CM | POA: Diagnosis not present

## 2012-06-29 DIAGNOSIS — R3915 Urgency of urination: Secondary | ICD-10-CM | POA: Diagnosis not present

## 2012-06-29 DIAGNOSIS — R350 Frequency of micturition: Secondary | ICD-10-CM | POA: Diagnosis not present

## 2012-07-17 DIAGNOSIS — G252 Other specified forms of tremor: Secondary | ICD-10-CM | POA: Diagnosis not present

## 2012-07-17 DIAGNOSIS — G250 Essential tremor: Secondary | ICD-10-CM | POA: Diagnosis not present

## 2012-07-17 DIAGNOSIS — R269 Unspecified abnormalities of gait and mobility: Secondary | ICD-10-CM | POA: Diagnosis not present

## 2012-07-17 DIAGNOSIS — R413 Other amnesia: Secondary | ICD-10-CM | POA: Diagnosis not present

## 2012-07-17 DIAGNOSIS — R51 Headache: Secondary | ICD-10-CM | POA: Diagnosis not present

## 2012-07-30 DIAGNOSIS — N3020 Other chronic cystitis without hematuria: Secondary | ICD-10-CM | POA: Diagnosis not present

## 2012-09-07 DIAGNOSIS — R079 Chest pain, unspecified: Secondary | ICD-10-CM | POA: Diagnosis not present

## 2012-09-07 DIAGNOSIS — Z79899 Other long term (current) drug therapy: Secondary | ICD-10-CM | POA: Diagnosis not present

## 2012-09-07 DIAGNOSIS — E7800 Pure hypercholesterolemia, unspecified: Secondary | ICD-10-CM | POA: Diagnosis not present

## 2012-09-07 DIAGNOSIS — I10 Essential (primary) hypertension: Secondary | ICD-10-CM | POA: Diagnosis not present

## 2012-09-14 ENCOUNTER — Other Ambulatory Visit: Payer: Self-pay | Admitting: Internal Medicine

## 2012-09-14 DIAGNOSIS — R921 Mammographic calcification found on diagnostic imaging of breast: Secondary | ICD-10-CM

## 2012-09-20 DIAGNOSIS — R51 Headache: Secondary | ICD-10-CM | POA: Diagnosis not present

## 2012-09-20 DIAGNOSIS — R413 Other amnesia: Secondary | ICD-10-CM | POA: Diagnosis not present

## 2012-09-20 DIAGNOSIS — R269 Unspecified abnormalities of gait and mobility: Secondary | ICD-10-CM | POA: Diagnosis not present

## 2012-09-25 ENCOUNTER — Ambulatory Visit
Admission: RE | Admit: 2012-09-25 | Discharge: 2012-09-25 | Disposition: A | Discharge status: Home / Self Care | Payer: 59 | Source: Ambulatory Visit | Attending: Internal Medicine | Admitting: Internal Medicine

## 2012-09-25 DIAGNOSIS — R921 Mammographic calcification found on diagnostic imaging of breast: Secondary | ICD-10-CM

## 2012-11-13 DIAGNOSIS — E669 Obesity, unspecified: Secondary | ICD-10-CM | POA: Diagnosis not present

## 2012-11-13 DIAGNOSIS — Z6827 Body mass index (BMI) 27.0-27.9, adult: Secondary | ICD-10-CM | POA: Diagnosis not present

## 2012-11-13 DIAGNOSIS — Z79899 Other long term (current) drug therapy: Secondary | ICD-10-CM | POA: Diagnosis not present

## 2012-11-13 DIAGNOSIS — I119 Hypertensive heart disease without heart failure: Secondary | ICD-10-CM | POA: Diagnosis not present

## 2013-03-12 DIAGNOSIS — I119 Hypertensive heart disease without heart failure: Secondary | ICD-10-CM | POA: Diagnosis not present

## 2013-03-12 DIAGNOSIS — E559 Vitamin D deficiency, unspecified: Secondary | ICD-10-CM | POA: Diagnosis not present

## 2013-03-12 DIAGNOSIS — E7800 Pure hypercholesterolemia, unspecified: Secondary | ICD-10-CM | POA: Diagnosis not present

## 2013-03-12 DIAGNOSIS — I69959 Hemiplegia and hemiparesis following unspecified cerebrovascular disease affecting unspecified side: Secondary | ICD-10-CM | POA: Diagnosis not present

## 2013-03-12 DIAGNOSIS — Z0100 Encounter for examination of eyes and vision without abnormal findings: Secondary | ICD-10-CM | POA: Diagnosis not present

## 2013-03-19 ENCOUNTER — Ambulatory Visit (INDEPENDENT_AMBULATORY_CARE_PROVIDER_SITE_OTHER): Payer: 59 | Admitting: Nurse Practitioner

## 2013-03-19 ENCOUNTER — Encounter: Payer: Self-pay | Admitting: Nurse Practitioner

## 2013-03-19 VITALS — BP 95/64 | HR 100 | Ht 62.75 in | Wt 139.0 lb

## 2013-03-19 DIAGNOSIS — R569 Unspecified convulsions: Secondary | ICD-10-CM

## 2013-03-19 DIAGNOSIS — I619 Nontraumatic intracerebral hemorrhage, unspecified: Secondary | ICD-10-CM | POA: Insufficient documentation

## 2013-03-19 DIAGNOSIS — R519 Headache, unspecified: Secondary | ICD-10-CM | POA: Insufficient documentation

## 2013-03-19 DIAGNOSIS — R51 Headache: Secondary | ICD-10-CM

## 2013-03-19 DIAGNOSIS — R269 Unspecified abnormalities of gait and mobility: Secondary | ICD-10-CM | POA: Diagnosis not present

## 2013-03-19 MED ORDER — LEVETIRACETAM 500 MG PO TABS: 500.0000 mg | ORAL_TABLET | Freq: Two times a day (BID) | ORAL | Status: DC

## 2013-03-19 MED ORDER — TOPIRAMATE 25 MG PO TABS: ORAL_TABLET | ORAL | Status: DC

## 2013-03-19 NOTE — Progress Notes (Signed)
HPI: Ms. Fairburn, 57 year old female returns for followup. She was last seen 09/20/2012. 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   She returns for followup after last visit on 09/20/12.  She continues to have  headaches several times a week , she does not describe them as disabling..  She remains on Topamax 25mg  tab,   total dose of 75mg  daily, is tolerating well without side effects but  complains of mild memory loss.  She underwent cerebral catheter angiogram on 10//13 by Dr.Deveshwar which showed stable appearance of pericallosal and left P2/P3 segment aneurysms. She states some mild tremor in the hand continues and it is not disabling.She complains of persistent mild right-sided weakness as well as some temperature sensitivity and sensation of coldness of the right foot ever since her stroke in 2011.She  does  feel her AFO tens unit is  effective. She has fallen recently.    ROS:  Blurred vision, memory loss, numbness, depression anxiety  Physical Exam General: well developed, well nourished, seated, in no evident distress Head: head normocephalic and atraumatic. Oropharynx benign Neck: supple with no carotid  bruits Cardiovascular: regular rate and rhythm, no murmurs  Neurologic Exam Mental Status: Awake and fully alert. Oriented to place and time. Follows all commands. Speech and language normal.   Cranial Nerves: Fundi not visualized.  Pupils equal, briskly reactive to light. Extraocular movements full without nystagmus. Visual fields full to confrontation. Hearing intact and symmetric to finger snap. Face is asymmetric with right  lower face weakness, tongue, palate move normally and symmetrically.   Motor: Mild 4/5 right-sided hemiparesis with distal more than proximal weakness. Diminished fine finger movements on the right. Mild right foot drop with increased tone in the right leg.  Sensory.: Touch and pinprick are normal on the left decreased on the right, vibratory decreased to right ankle.  Coordination: Slightly impaired on the right. Gait and Station: Arises from chair without difficulty. Stance is broad-based . Right foot drop, wearing AFO, using single-point cane.  Reflexes: 2+ and symmetric except decreased  ankle jerks. Toes downgoing.     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   PLAN: Continue Topamax 75 mg daily for headache will renew Continue Keppra 500 mg twice a day Will renew Chair exercises Followup in 6 months Nilda Riggs, GNP-BC APRN

## 2013-03-19 NOTE — Patient Instructions (Addendum)
Continue Topamax 75 mg daily for headache or new Continue Keppra 500 grams twice a day Will renew Chair exercises Followup in 6 months

## 2013-03-20 ENCOUNTER — Ambulatory Visit: Payer: Self-pay | Admitting: Nurse Practitioner

## 2013-04-10 ENCOUNTER — Encounter: Payer: Self-pay | Admitting: Podiatry

## 2013-04-10 ENCOUNTER — Ambulatory Visit (INDEPENDENT_AMBULATORY_CARE_PROVIDER_SITE_OTHER): Payer: 59 | Admitting: Podiatry

## 2013-04-10 VITALS — BP 119/70 | HR 73 | Ht 62.0 in | Wt 136.0 lb

## 2013-04-10 DIAGNOSIS — M216X9 Other acquired deformities of unspecified foot: Secondary | ICD-10-CM

## 2013-04-10 DIAGNOSIS — R269 Unspecified abnormalities of gait and mobility: Secondary | ICD-10-CM

## 2013-04-10 DIAGNOSIS — M21371 Foot drop, right foot: Secondary | ICD-10-CM

## 2013-04-10 NOTE — Progress Notes (Signed)
Subjective: 57 y.o. year old female patient presents complaining of problem walking with right lower limb since she had a stroke in 11/27/2009.  She is wearing a device on right knee to help pick up the right foot. Stated that this is the third one. She feels it is not as effective as the first time she start wearing. Right foot is dropping when walk and difficult to pick up. Big toe contracts and gets cocked up with bruise on top. Her right arm is also weak and had to learn to write with left hand.   Review of Systems - General ROS: negative for - fatigue, fever, hot flashes, night sweats, sleep disturbance, weight gain or weight loss Ophthalmic ROS: Vision is not as good since stroke 11/27/2009. ENT ROS: negative Hematological and Lymphatic ROS: negative Endocrine ROS: negative Breast ROS: negative for breast lumps Respiratory ROS: no cough, shortness of breath, or wheezing Cardiovascular ROS: no chest pain or dyspnea on exertion Gastrointestinal ROS: no abdominal pain, change in bowel habits, or black or bloody stools Genito-Urinary ROS: no dysuria, trouble voiding, or hematuria Musculoskeletal ROS: Since stroke right side is weak, numb, tingles, and had to learn to be left handed.  Neurological ROS: Stroke from a Hemorrhagic aneurism at back and side of head.  Dermatological ROS: negative  Objective: Dermatologic: No abnormal findings. No acute skin lesions or open lesions noted. There is a mild hyperemia over IPJ dorsal surface of right hallux from shoe friction. Vascular:  PT palpable bilateral. DP faintly palpable on left. Not palpable on right. Neurologic:  Monofilament sensory test show decreased and failed to response on right, but left side has normal response.  DTR hyperreflex on right. Normal on left.  Positive of subjective numbness and tingling sensation on right hand and foot. Orthopedic:  Tight Achilles tendon unable to dorsiflex beyond 90 at ankle joint on right side. Left  side is normal.  Cocked up hallux on right with EHL contraction.  Weak EDL to abduct right foot lateral direction.  Tibialis posterior, Tibialis anterior, and Extenso Halluces Longus tendon appears to have normal strength.  Radiographic examination reveal increased lateral deviation of the Calcaneocuboid joint right, mild hallux abducto interphalangeus, and varus rotated 5th right on AP view. The left foot has normal findings in AP and lateral.  The lateral view of the right reveal normal osseous and articular structure and alignment. No gross deformities are noted.   Assessment: Weak EDL right. Over powering FHL right with cocked up hallux. Drop foot right. Ankle joint equinus right. S/P stroke with weakness on right side upper and lower limbs.  Plan: Discussed clinical findings and available treatment options.  Discussed the benefit of Ankle Foot Orthosis (Richie brace) to assist ambulation with drop foot.  Patient will return to prepare for AFO right lower limb.

## 2013-04-18 ENCOUNTER — Other Ambulatory Visit: Payer: Self-pay | Admitting: Neurology

## 2013-04-24 ENCOUNTER — Ambulatory Visit (INDEPENDENT_AMBULATORY_CARE_PROVIDER_SITE_OTHER): Payer: 59 | Admitting: Podiatry

## 2013-04-24 DIAGNOSIS — M216X9 Other acquired deformities of unspecified foot: Secondary | ICD-10-CM

## 2013-04-24 DIAGNOSIS — M21371 Foot drop, right foot: Secondary | ICD-10-CM

## 2013-04-24 DIAGNOSIS — R269 Unspecified abnormalities of gait and mobility: Secondary | ICD-10-CM

## 2013-04-24 NOTE — Progress Notes (Signed)
Patient came in to have right foot casted for Richie brace. Patient wears electronic monitors on right lower limb.  Since her stroke she has weak right side with right foot drawing inward make it difficult for her to catch balance and walk.   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  Plan: Richie brace with Valgus strap to resist the right foot to turn in. Using fiber glass, right foot and lower limb impression was taken. Reviewed findings again.

## 2013-04-30 DIAGNOSIS — L820 Inflamed seborrheic keratosis: Secondary | ICD-10-CM | POA: Diagnosis not present

## 2013-05-13 ENCOUNTER — Other Ambulatory Visit (HOSPITAL_COMMUNITY): Payer: Self-pay | Admitting: Interventional Radiology

## 2013-05-13 DIAGNOSIS — I729 Aneurysm of unspecified site: Secondary | ICD-10-CM

## 2013-05-16 ENCOUNTER — Other Ambulatory Visit: Payer: Self-pay | Admitting: Radiology

## 2013-05-22 ENCOUNTER — Encounter (HOSPITAL_COMMUNITY): Payer: Self-pay | Admitting: Pharmacy Technician

## 2013-05-24 ENCOUNTER — Ambulatory Visit (HOSPITAL_COMMUNITY)
Admission: RE | Admit: 2013-05-24 | Discharge: 2013-05-24 | Disposition: A | Discharge status: Home / Self Care | Payer: 59 | Source: Ambulatory Visit | Attending: Interventional Radiology | Admitting: Interventional Radiology

## 2013-05-24 ENCOUNTER — Other Ambulatory Visit (HOSPITAL_COMMUNITY): Payer: Self-pay | Admitting: Interventional Radiology

## 2013-05-24 DIAGNOSIS — I671 Cerebral aneurysm, nonruptured: Secondary | ICD-10-CM | POA: Insufficient documentation

## 2013-05-24 DIAGNOSIS — J449 Chronic obstructive pulmonary disease, unspecified: Secondary | ICD-10-CM | POA: Insufficient documentation

## 2013-05-24 DIAGNOSIS — I729 Aneurysm of unspecified site: Secondary | ICD-10-CM

## 2013-05-24 DIAGNOSIS — E785 Hyperlipidemia, unspecified: Secondary | ICD-10-CM | POA: Insufficient documentation

## 2013-05-24 DIAGNOSIS — J4489 Other specified chronic obstructive pulmonary disease: Secondary | ICD-10-CM | POA: Diagnosis not present

## 2013-05-24 DIAGNOSIS — Z8673 Personal history of transient ischemic attack (TIA), and cerebral infarction without residual deficits: Secondary | ICD-10-CM | POA: Insufficient documentation

## 2013-05-24 DIAGNOSIS — R51 Headache: Secondary | ICD-10-CM | POA: Insufficient documentation

## 2013-05-24 LAB — CBC WITH DIFFERENTIAL/PLATELET
Basophils Absolute: 0 10*3/uL (ref 0.0–0.1)
Basophils Relative: 0 % (ref 0–1)
Eosinophils Absolute: 0.2 10*3/uL (ref 0.0–0.7)
Eosinophils Relative: 4 % (ref 0–5)
HCT: 35.7 % — ABNORMAL LOW (ref 36.0–46.0)
Hemoglobin: 11.9 g/dL — ABNORMAL LOW (ref 12.0–15.0)
Lymphocytes Relative: 33 % (ref 12–46)
Lymphs Abs: 1.9 10*3/uL (ref 0.7–4.0)
MCH: 30.1 pg (ref 26.0–34.0)
MCHC: 33.3 g/dL (ref 30.0–36.0)
MCV: 90.4 fL (ref 78.0–100.0)
Monocytes Absolute: 0.7 10*3/uL (ref 0.1–1.0)
Monocytes Relative: 12 % (ref 3–12)
Neutro Abs: 2.9 10*3/uL (ref 1.7–7.7)
Neutrophils Relative %: 51 % (ref 43–77)
Platelets: 182 10*3/uL (ref 150–400)
RBC: 3.95 MIL/uL (ref 3.87–5.11)
RDW: 13.6 % (ref 11.5–15.5)
WBC: 5.7 10*3/uL (ref 4.0–10.5)

## 2013-05-24 LAB — BASIC METABOLIC PANEL
BUN: 23 mg/dL (ref 6–23)
CO2: 23 mEq/L (ref 19–32)
Calcium: 9.5 mg/dL (ref 8.4–10.5)
Chloride: 109 mEq/L (ref 96–112)
Creatinine, Ser: 0.61 mg/dL (ref 0.50–1.10)
GFR calc Af Amer: >90 mL/min (ref >90)
GFR calc non Af Amer: >90 mL/min (ref >90)
Glucose, Bld: 94 mg/dL (ref 70–99)
Potassium: 4 mEq/L (ref 3.5–5.1)
Sodium: 142 mEq/L (ref 135–145)

## 2013-05-24 LAB — APTT: aPTT: 30 s (ref 24–37)

## 2013-05-24 LAB — PROTIME-INR
INR: 0.93 (ref 0.00–1.49)
Prothrombin Time: 12.3 s (ref 11.6–15.2)

## 2013-05-24 MED ORDER — IOHEXOL 300 MG/ML  SOLN
150.0000 mL | Freq: Once | INTRAMUSCULAR | Status: AC | PRN
  Administered 2013-05-24: 80 mL via INTRAVENOUS

## 2013-05-24 MED ORDER — FENTANYL CITRATE 0.05 MG/ML IJ SOLN
INTRAMUSCULAR | Status: AC
  Filled 2013-05-24: qty 4

## 2013-05-24 MED ORDER — MIDAZOLAM HCL 2 MG/2ML IJ SOLN
INTRAMUSCULAR | Status: AC | PRN
  Administered 2013-05-24: 1 mg via INTRAVENOUS

## 2013-05-24 MED ORDER — SODIUM CHLORIDE 0.9 % IV SOLN: INTRAVENOUS | Status: AC

## 2013-05-24 MED ORDER — HEPARIN SOD (PORK) LOCK FLUSH 100 UNIT/ML IV SOLN
INTRAVENOUS | Status: AC | PRN
  Administered 2013-05-24 (×2): 500 [IU] via INTRAVENOUS

## 2013-05-24 MED ORDER — FENTANYL CITRATE 0.05 MG/ML IJ SOLN
INTRAMUSCULAR | Status: AC | PRN
  Administered 2013-05-24: 25 ug via INTRAVENOUS

## 2013-05-24 MED ORDER — SODIUM CHLORIDE 0.9 % IV SOLN
Freq: Once | INTRAVENOUS | Status: AC
  Administered 2013-05-24: 07:00:00 via INTRAVENOUS

## 2013-05-24 MED ORDER — MIDAZOLAM HCL 2 MG/2ML IJ SOLN
INTRAMUSCULAR | Status: AC
  Filled 2013-05-24: qty 4

## 2013-05-24 NOTE — ED Notes (Signed)
Pt not diabetic

## 2013-05-24 NOTE — Procedures (Signed)
S/P 4 vessel cerebral arteriogram. RT CFA approach  Findings. 1.Lt pericallosal aneurysm2.64mm x 2.7 mm. 2.Lt PICA aneurysm approx 2.87mmx 2.9mm

## 2013-05-24 NOTE — ED Notes (Signed)
Last asa 81 mg yesterday am

## 2013-05-24 NOTE — H&P (Signed)
[name] is an 57 y.o. female.   Chief Complaint: "I'm here for an angiogram" HPI: Patient with history of ICH 2011, chronic headaches and multiple intracranial aneurysms (left pericallosal, left PICA regions) presents today for follow up cerebral arteriogram to assess stability.  Past Medical History  Diagnosis Date  . Asthma   . COPD (chronic obstructive pulmonary disease)      - PFTs  01/24/06 FEV1 69% ratio 68% diffusing capacity 57% with no improvement after B2   - PFT's 10/1/ 09 FEV1 77   ratio  76    dlco                   45            no resp to B2    - Nl alpha one antitripsin level 10/09  . Hyperlipidemia   . Stroke   . Aneurysm   . Depression   . Anxiety   . UJWJXBJY(782.9)     Past Surgical History  Procedure Laterality Date  . Laparoscopic hysterectomy    . Mandible fracture surgery    . Back surgery      Family History  Problem Relation Age of Onset  . Heart disease Father   . Heart attack Father   . Lung cancer    . Heart disease Mother    Social History:  reports that she quit smoking about 3 years ago. She has never used smokeless tobacco. She reports that she does not drink alcohol or use illicit drugs.  Allergies:  Allergies  Allergen Reactions  . Phenytoin Hives and Swelling    Current outpatient prescriptions:ALPRAZolam (XANAX) 0.25 MG tablet, Take 0.25 mg by mouth at bedtime. , Disp: , Rfl: ;  aspirin 81 MG EC tablet, Take 81 mg by mouth every morning. , Disp: , Rfl: ;  B Complex-C (SUPER B COMPLEX PO), Take 1 tablet by mouth daily., Disp: , Rfl: ;  Biotin 5000 MCG TABS, Take 1 tablet by mouth daily., Disp: , Rfl:  Calcium Carb-Cholecalciferol (CALCIUM 500 +D) 500-400 MG-UNIT TABS, Take 1 tablet by mouth daily., Disp: , Rfl: ;  Cholecalciferol (VITAMIN D-3 PO), Take 1,000 Units by mouth 2 (two) times daily. , Disp: , Rfl: ;  citalopram (CELEXA) 40 MG tablet, Take 40 mg by mouth at bedtime., Disp: , Rfl: ;  Cranberry 450 MG TABS, Take 1 tablet  by mouth daily., Disp: , Rfl:  cyclobenzaprine (FLEXERIL) 10 MG tablet, Take 5 mg by mouth at bedtime. , Disp: , Rfl: ;  famotidine (PEPCID) 20 MG tablet, Take 20 mg by mouth 2 (two) times daily. , Disp: , Rfl: ;  levETIRAcetam (KEPPRA) 500 MG tablet, Take 1 tablet (500 mg total) by mouth every 12 (twelve) hours., Disp: 60 tablet, Rfl: 6 levothyroxine (SYNTHROID, LEVOTHROID) 50 MCG tablet, Take 25-50 mcg by mouth See admin instructions. Takes on Sat & Sun & on all other days, Disp: , Rfl: ;  losartan (COZAAR) 100 MG tablet, Take 100 mg by mouth every morning. , Disp: , Rfl: ;  Multiple Vitamins-Minerals (HAIR/SKIN/NAILS) TABS, Take 1 tablet by mouth daily., Disp: , Rfl: ;  rosuvastatin (CRESTOR) 10 MG tablet, Take 10 mg by mouth at bedtime. , Disp: , Rfl:  topiramate (TOPAMAX) 25 MG tablet, Take 25-50 mg by mouth 2 (two) times daily. Takes 25mg  in the morning & 50mg  at night, Disp: , Rfl: ;  vitamin B-12 (CYANOCOBALAMIN) 1000 MCG tablet, Take 1,000 mcg by mouth  daily., Disp: , Rfl: ;  diclofenac sodium (VOLTAREN) 1 % GEL, Apply 2 g topically 3 (three) times daily as needed. For pain, Disp: , Rfl:    Results for orders placed during the hospital encounter of 05/24/13 (from the past 48 hour(s))  APTT     Status: None   Collection Time    05/24/13  6:46 AM      Result Value Range   aPTT 30  24 - 37 seconds  CBC WITH DIFFERENTIAL     Status: Abnormal   Collection Time    05/24/13  6:46 AM      Result Value Range   WBC 5.7  4.0 - 10.5 K/uL   RBC 3.95  3.87 - 5.11 MIL/uL   Hemoglobin 11.9 (*) 12.0 - 15.0 g/dL   HCT 16.1 (*) 09.6 - 04.5 %   MCV 90.4  78.0 - 100.0 fL   MCH 30.1  26.0 - 34.0 pg   MCHC 33.3  30.0 - 36.0 g/dL   RDW 40.9  81.1 - 91.4 %   Platelets 182  150 - 400 K/uL   Neutrophils Relative % 51  43 - 77 %   Neutro Abs 2.9  1.7 - 7.7 K/uL   Lymphocytes Relative 33  12 - 46 %   Lymphs Abs 1.9  0.7 - 4.0 K/uL   Monocytes Relative 12  3 - 12 %   Monocytes Absolute 0.7  0.1  - 1.0 K/uL   Eosinophils Relative 4  0 - 5 %   Eosinophils Absolute 0.2  0.0 - 0.7 K/uL   Basophils Relative 0  0 - 1 %   Basophils Absolute 0.0  0.0 - 0.1 K/uL  PROTIME-INR     Status: None   Collection Time    05/24/13  6:46 AM      Result Value Range   Prothrombin Time 12.3  11.6 - 15.2 seconds   INR 0.93  0.00 - 1.49   05/24/13 BMP pending Review of Systems  Constitutional: Negative for fever and chills.  Respiratory: Negative for shortness of breath.        Occ cough  Cardiovascular: Negative for chest pain.  Gastrointestinal: Negative for nausea, vomiting and abdominal pain.  Musculoskeletal: Positive for back pain.  Neurological: Positive for dizziness, sensory change, focal weakness and headaches.       Occ word finding difficulties, balance problems, presyncopal spells, prior hx of seizures  Endo/Heme/Allergies: Does not bruise/bleed easily.    Blood pressure 105/66, pulse 83, temperature 97.5 F (36.4 C), temperature source Oral, resp. rate 18, height 5' 2.5" (1.588 m), weight 136 lb (61.689 kg). Physical Exam  Constitutional: She is oriented to person, place, and time. She appears well-developed and well-nourished.  Cardiovascular: Normal rate and regular rhythm.   Respiratory: Effort normal and breath sounds normal.  GI: Soft. Bowel sounds are normal. There is no tenderness.  Musculoskeletal: She exhibits no edema.  Neurological: She is alert and oriented to person, place, and time.  Pt with chronic left facial droop, tongue midline; sl weakness and decreased sens perception on right, left WNL; PERRL/EOMI     Assessment/Plan Pt with hx ICH 2011, chronic headaches and multiple intracranial aneurysms (left pericallosal/PICA regions). Plan is for follow up cerebral arteriogram today to assess stability. Details/risks of procedure d/w pt/family with their understanding and consent.  ALLRED,D KEVIN 05/24/2013, 7:59 AM

## 2013-06-11 ENCOUNTER — Other Ambulatory Visit (HOSPITAL_COMMUNITY): Payer: Self-pay | Admitting: Interventional Radiology

## 2013-06-11 ENCOUNTER — Other Ambulatory Visit: Payer: Self-pay | Admitting: Neurology

## 2013-06-11 DIAGNOSIS — I729 Aneurysm of unspecified site: Secondary | ICD-10-CM

## 2013-06-12 ENCOUNTER — Ambulatory Visit (HOSPITAL_COMMUNITY)
Admission: RE | Admit: 2013-06-12 | Discharge: 2013-06-12 | Disposition: A | Discharge status: Home / Self Care | Payer: 59 | Source: Ambulatory Visit | Attending: Interventional Radiology | Admitting: Interventional Radiology

## 2013-06-12 DIAGNOSIS — I729 Aneurysm of unspecified site: Secondary | ICD-10-CM

## 2013-06-26 ENCOUNTER — Ambulatory Visit (INDEPENDENT_AMBULATORY_CARE_PROVIDER_SITE_OTHER): Payer: 59 | Admitting: Podiatry

## 2013-06-26 ENCOUNTER — Encounter: Payer: Self-pay | Admitting: Podiatry

## 2013-06-26 VITALS — BP 120/69 | HR 75 | Ht 62.0 in | Wt 136.0 lb

## 2013-06-26 DIAGNOSIS — M21371 Foot drop, right foot: Secondary | ICD-10-CM

## 2013-06-26 DIAGNOSIS — M216X9 Other acquired deformities of unspecified foot: Secondary | ICD-10-CM

## 2013-06-26 NOTE — Patient Instructions (Signed)
Brace is helping the foot from turning. Continue to use other brace to help out with drop foot.  Continue to exercise lower limb muscle.

## 2013-06-26 NOTE — Progress Notes (Signed)
57 year old female accompanied by her mother presents for follow up after using Ritchie brace for a month.  Doing well with Ritchie brace. Still using other brace with electrical stimulator to help out with foot drop. Patient will continue exercise lower limb muscles. Return as needed.

## 2013-07-10 ENCOUNTER — Ambulatory Visit
Admission: RE | Admit: 2013-07-10 | Discharge: 2013-07-10 | Disposition: A | Discharge status: Home / Self Care | Payer: 59 | Source: Ambulatory Visit | Attending: Internal Medicine | Admitting: Internal Medicine

## 2013-07-10 ENCOUNTER — Other Ambulatory Visit: Payer: Self-pay | Admitting: Internal Medicine

## 2013-07-10 DIAGNOSIS — R0602 Shortness of breath: Secondary | ICD-10-CM

## 2013-07-10 DIAGNOSIS — Z006 Encounter for examination for normal comparison and control in clinical research program: Secondary | ICD-10-CM | POA: Diagnosis not present

## 2013-07-10 DIAGNOSIS — R350 Frequency of micturition: Secondary | ICD-10-CM | POA: Diagnosis not present

## 2013-07-10 DIAGNOSIS — E039 Hypothyroidism, unspecified: Secondary | ICD-10-CM | POA: Diagnosis not present

## 2013-07-10 DIAGNOSIS — R059 Cough, unspecified: Secondary | ICD-10-CM | POA: Diagnosis not present

## 2013-07-10 DIAGNOSIS — R05 Cough: Secondary | ICD-10-CM | POA: Diagnosis not present

## 2013-07-11 ENCOUNTER — Other Ambulatory Visit (HOSPITAL_COMMUNITY): Payer: Self-pay | Admitting: Interventional Radiology

## 2013-07-11 DIAGNOSIS — I729 Aneurysm of unspecified site: Secondary | ICD-10-CM

## 2013-07-12 ENCOUNTER — Other Ambulatory Visit: Payer: Self-pay | Admitting: Neurology

## 2013-07-18 ENCOUNTER — Encounter (HOSPITAL_COMMUNITY): Payer: Self-pay | Admitting: Respiratory Therapy

## 2013-07-22 ENCOUNTER — Other Ambulatory Visit: Payer: Self-pay | Admitting: Radiology

## 2013-07-23 NOTE — Pre-Procedure Instructions (Addendum)
[name]  07/23/2013   Your procedure is scheduled on:  Wednesday, November 26th.  Report to [facility], Main Entrance/ Entrance "A" at 6:30 AM.  Call this number if you have problems the morning of surgery: [phone]   Remember:   Do not eat food or drink liquids after midnight.   Take these medicines the morning of surgery with A SIP OF WATER:  Levetiracetam (Keppra), Levothyroxine (Synthyroid), Topiramate (Topamax).              Stop taking Coumadin, Plavix, Effient and Herbal medications.  Do not take any NSAIDs ie: Ibuprofen,  Advil,Naproxen ,diclfenac,vitamins  Starting 07/26/13   Do not wear jewelry, make-up or nail polish.  Do not wear lotions, powders, or perfumes. You may wear deodorant.  Do not shave 48 hours prior to surgery.   Do not bring valuables to the hospital.  [facility] is not responsible for any belongings or valuables.               Contacts, dentures or bridgework may not be worn into surgery.  Leave suitcase in the car. After surgery it may be brought to your room.  For patients admitted to the hospital, discharge time is determined by your treatment team.               Patients discharged the day of surgery will not be allowed to drive home.  Name and phone number of your driver: -   Special Instructions: Shower using CHG 2 nights before surgery and the night before surgery.  If you shower the day of surgery use CHG.  Use special wash - you have one bottle of CHG for all showers.  You should use approximately 1/3 of the bottle for each shower.   Please read over the following fact sheets that you were given: Pain Booklet, Coughing and Deep Breathing and Surgical Site Infection Prevention

## 2013-07-24 ENCOUNTER — Encounter (HOSPITAL_COMMUNITY)
Admission: RE | Admit: 2013-07-24 | Discharge: 2013-07-24 | Disposition: A | Discharge status: Home / Self Care | Payer: 59 | Source: Ambulatory Visit | Attending: Interventional Radiology | Admitting: Interventional Radiology

## 2013-07-24 ENCOUNTER — Encounter (HOSPITAL_COMMUNITY): Payer: Self-pay

## 2013-07-24 DIAGNOSIS — Z01810 Encounter for preprocedural cardiovascular examination: Secondary | ICD-10-CM | POA: Insufficient documentation

## 2013-07-24 DIAGNOSIS — Z01818 Encounter for other preprocedural examination: Secondary | ICD-10-CM | POA: Insufficient documentation

## 2013-07-24 DIAGNOSIS — Z01812 Encounter for preprocedural laboratory examination: Secondary | ICD-10-CM | POA: Insufficient documentation

## 2013-07-24 HISTORY — DX: Personal history of urinary calculi: Z87.442

## 2013-07-24 HISTORY — DX: Essential (primary) hypertension: I10

## 2013-07-24 HISTORY — DX: Pneumonia, unspecified organism: J18.9

## 2013-07-24 HISTORY — DX: Hypothyroidism, unspecified: E03.9

## 2013-07-24 HISTORY — DX: Anemia, unspecified: D64.9

## 2013-07-24 LAB — CBC WITH DIFFERENTIAL/PLATELET
Basophils Absolute: 0 K/uL (ref 0.0–0.1)
Basophils Relative: 0 % (ref 0–1)
Eosinophils Absolute: 0.2 K/uL (ref 0.0–0.7)
Eosinophils Relative: 3 % (ref 0–5)
HCT: 38.5 % (ref 36.0–46.0)
Hemoglobin: 12.7 g/dL (ref 12.0–15.0)
Lymphocytes Relative: 27 % (ref 12–46)
Lymphs Abs: 1.8 K/uL (ref 0.7–4.0)
MCH: 30.3 pg (ref 26.0–34.0)
MCHC: 33 g/dL (ref 30.0–36.0)
MCV: 91.9 fL (ref 78.0–100.0)
Monocytes Absolute: 0.8 K/uL (ref 0.1–1.0)
Monocytes Relative: 12 % (ref 3–12)
Neutro Abs: 3.8 K/uL (ref 1.7–7.7)
Neutrophils Relative %: 58 % (ref 43–77)
Platelets: 179 K/uL (ref 150–400)
RBC: 4.19 MIL/uL (ref 3.87–5.11)
RDW: 13.2 % (ref 11.5–15.5)
WBC: 6.5 K/uL (ref 4.0–10.5)

## 2013-07-24 LAB — COMPREHENSIVE METABOLIC PANEL WITH GFR
ALT: 17 U/L (ref 0–35)
AST: 23 U/L (ref 0–37)
Albumin: 3.6 g/dL (ref 3.5–5.2)
Alkaline Phosphatase: 84 U/L (ref 39–117)
BUN: 22 mg/dL (ref 6–23)
CO2: 26 meq/L (ref 19–32)
Calcium: 9.8 mg/dL (ref 8.4–10.5)
Chloride: 109 meq/L (ref 96–112)
Creatinine, Ser: 0.64 mg/dL (ref 0.50–1.10)
GFR calc Af Amer: >90 mL/min
GFR calc non Af Amer: >90 mL/min
Glucose, Bld: 91 mg/dL (ref 70–99)
Potassium: 4.7 meq/L (ref 3.5–5.1)
Sodium: 145 meq/L (ref 135–145)
Total Bilirubin: 0.6 mg/dL (ref 0.3–1.2)
Total Protein: 7.2 g/dL (ref 6.0–8.3)

## 2013-07-24 LAB — PROTIME-INR
INR: 0.96 (ref 0.00–1.49)
Prothrombin Time: 12.6 seconds (ref 11.6–15.2)

## 2013-07-24 LAB — APTT: aPTT: 30 s (ref 24–37)

## 2013-07-29 ENCOUNTER — Encounter (HOSPITAL_COMMUNITY): Payer: Self-pay | Admitting: Pharmacy Technician

## 2013-07-29 NOTE — Progress Notes (Signed)
Patient is scheduled for pipeline intervention with Dr. Corliss Skains on December 10th, plavix 75mg  daily has been called into her pharmacy today. The patient is to continue on her home dose of aspirin daily.   Pattricia Boss PA-C Interventional Radiology  07/29/13  4:43 PM

## 2013-07-31 ENCOUNTER — Ambulatory Visit (HOSPITAL_COMMUNITY): Admission: RE | Admit: 2013-07-31 | Payer: 59 | Source: Ambulatory Visit

## 2013-08-14 ENCOUNTER — Ambulatory Visit (HOSPITAL_COMMUNITY): Payer: 59

## 2013-08-15 ENCOUNTER — Encounter (HOSPITAL_COMMUNITY): Payer: Self-pay | Admitting: Pharmacy Technician

## 2013-08-16 ENCOUNTER — Telehealth (HOSPITAL_COMMUNITY): Payer: Self-pay | Admitting: Interventional Radiology

## 2013-08-16 NOTE — Telephone Encounter (Signed)
Called pt to make sure she understood how and what meds she is and should be taking prior to her upcoming Pipeline procedure on 08/21/13. I told pt she should start her Plavix 75mg  1 daily today (08/16/13, 5 days prior), she is to continue her Aspirin 81mg  1 q d, and she is allowed to take her normal medications on the morning of the procedure w/ a sip of water. Patient states understanding and is in agreement with this plan of care. JM

## 2013-08-20 ENCOUNTER — Encounter (HOSPITAL_COMMUNITY): Payer: Self-pay

## 2013-08-20 ENCOUNTER — Encounter (HOSPITAL_COMMUNITY)
Admission: RE | Admit: 2013-08-20 | Discharge: 2013-08-20 | Disposition: A | Discharge status: Home / Self Care | Payer: 59 | Source: Ambulatory Visit | Attending: Interventional Radiology | Admitting: Interventional Radiology

## 2013-08-20 HISTORY — DX: Epilepsy, unspecified, not intractable, without status epilepticus: G40.909

## 2013-08-20 HISTORY — DX: Other amnesia: R41.3

## 2013-08-20 HISTORY — DX: Unspecified convulsions: R56.9

## 2013-08-20 LAB — CBC WITH DIFFERENTIAL/PLATELET
Basophils Absolute: 0 10*3/uL (ref 0.0–0.1)
Basophils Relative: 0 % (ref 0–1)
Eosinophils Absolute: 0.1 10*3/uL (ref 0.0–0.7)
Eosinophils Relative: 2 % (ref 0–5)
HCT: 40.7 % (ref 36.0–46.0)
Hemoglobin: 13.2 g/dL (ref 12.0–15.0)
Lymphocytes Relative: 35 % (ref 12–46)
Lymphs Abs: 2.2 10*3/uL (ref 0.7–4.0)
MCH: 29.9 pg (ref 26.0–34.0)
MCHC: 32.4 g/dL (ref 30.0–36.0)
MCV: 92.1 fL (ref 78.0–100.0)
Monocytes Absolute: 0.6 10*3/uL (ref 0.1–1.0)
Monocytes Relative: 9 % (ref 3–12)
Neutro Abs: 3.3 10*3/uL (ref 1.7–7.7)
Neutrophils Relative %: 53 % (ref 43–77)
Platelets: 205 10*3/uL (ref 150–400)
RBC: 4.42 MIL/uL (ref 3.87–5.11)
RDW: 13.4 % (ref 11.5–15.5)
WBC: 6.3 10*3/uL (ref 4.0–10.5)

## 2013-08-20 LAB — COMPREHENSIVE METABOLIC PANEL
ALT: 24 U/L (ref 0–35)
AST: 34 U/L (ref 0–37)
Albumin: 3.7 g/dL (ref 3.5–5.2)
Alkaline Phosphatase: 90 U/L (ref 39–117)
BUN: 18 mg/dL (ref 6–23)
CO2: 24 mEq/L (ref 19–32)
Calcium: 9.9 mg/dL (ref 8.4–10.5)
Chloride: 106 mEq/L (ref 96–112)
Creatinine, Ser: 0.55 mg/dL (ref 0.50–1.10)
GFR calc Af Amer: >90 mL/min (ref >90)
GFR calc non Af Amer: >90 mL/min (ref >90)
Glucose, Bld: 94 mg/dL (ref 70–99)
Potassium: 4.5 mEq/L (ref 3.5–5.1)
Sodium: 140 mEq/L (ref 135–145)
Total Bilirubin: 0.5 mg/dL (ref 0.3–1.2)
Total Protein: 7.4 g/dL (ref 6.0–8.3)

## 2013-08-20 LAB — PROTIME-INR
INR: 0.9 (ref 0.00–1.49)
Prothrombin Time: 12 seconds (ref 11.6–15.2)

## 2013-08-20 LAB — APTT: aPTT: 30 seconds (ref 24–37)

## 2013-08-20 NOTE — Pre-Procedure Instructions (Signed)
NYRIE SIGAL  08/20/2013   Your procedure is scheduled on:  Wednesday, December 17  Report to [facility] Main Entrance "A" at 6:30 AM.  Call this number if you have problems the morning of surgery: [phone]   Remember:   Do not eat food or drink liquids after midnight. Tuesday night   Take these medicines the morning of surgery with A SIP OF WATER: Levetiractetam (Keppra), Levothyroxine, Topiramate, Pepcid   Do not wear jewelry, make-up or nail polish.  Do not wear lotions, powders, or perfumes. You may wear deodorant.  Do not shave 48 hours prior to surgery. Men may shave face and neck.  Do not bring valuables to the hospital.  [facility] is not responsible                  for any belongings or valuables.               Contacts, dentures or bridgework may not be worn into surgery.  Leave suitcase in the car. After surgery it may be brought to your room.  For patients admitted to the hospital, discharge time is determined by your                treatment team.               Patients discharged the day of surgery will not be allowed to drive  home.  Name and phone number of your driver:   Special Instructions: Shower using CHG 2 nights before surgery and the night before surgery.  If you shower the day of surgery use CHG.  Use special wash - you have one bottle of CHG for all showers.  You should use approximately 1/3 of the bottle for each shower.   Please read over the following fact sheets that you were given: Pain Booklet, Coughing and Deep Breathing and Surgical Site Infection Prevention

## 2013-08-21 ENCOUNTER — Encounter (HOSPITAL_COMMUNITY): Payer: Self-pay | Admitting: Anesthesiology

## 2013-08-21 ENCOUNTER — Ambulatory Visit (HOSPITAL_COMMUNITY)
Admission: RE | Admit: 2013-08-21 | Discharge: 2013-08-21 | Disposition: A | Discharge status: Home / Self Care | Payer: 59 | Source: Ambulatory Visit | Attending: Interventional Radiology | Admitting: Interventional Radiology

## 2013-08-21 ENCOUNTER — Inpatient Hospital Stay (HOSPITAL_COMMUNITY)
Admission: RE | Admit: 2013-08-21 | Discharge: 2013-08-22 | DRG: 027 | Disposition: A | Discharge status: Home / Self Care | Payer: 59 | Source: Ambulatory Visit | Attending: Interventional Radiology | Admitting: Interventional Radiology

## 2013-08-21 ENCOUNTER — Encounter (HOSPITAL_COMMUNITY)
Admission: RE | Disposition: A | Discharge status: Home / Self Care | Payer: Self-pay | Source: Ambulatory Visit | Attending: Interventional Radiology

## 2013-08-21 ENCOUNTER — Encounter (HOSPITAL_COMMUNITY): Payer: Self-pay

## 2013-08-21 ENCOUNTER — Ambulatory Visit (HOSPITAL_COMMUNITY): Payer: 59 | Admitting: Anesthesiology

## 2013-08-21 ENCOUNTER — Encounter (HOSPITAL_COMMUNITY): Payer: Self-pay | Admitting: *Deleted

## 2013-08-21 ENCOUNTER — Encounter (HOSPITAL_COMMUNITY): Payer: 59 | Admitting: Anesthesiology

## 2013-08-21 DIAGNOSIS — F411 Generalized anxiety disorder: Secondary | ICD-10-CM | POA: Diagnosis present

## 2013-08-21 DIAGNOSIS — I69998 Other sequelae following unspecified cerebrovascular disease: Secondary | ICD-10-CM

## 2013-08-21 DIAGNOSIS — I10 Essential (primary) hypertension: Secondary | ICD-10-CM | POA: Diagnosis present

## 2013-08-21 DIAGNOSIS — E039 Hypothyroidism, unspecified: Secondary | ICD-10-CM | POA: Diagnosis present

## 2013-08-21 DIAGNOSIS — G40909 Epilepsy, unspecified, not intractable, without status epilepticus: Secondary | ICD-10-CM | POA: Diagnosis present

## 2013-08-21 DIAGNOSIS — F3289 Other specified depressive episodes: Secondary | ICD-10-CM | POA: Diagnosis present

## 2013-08-21 DIAGNOSIS — J449 Chronic obstructive pulmonary disease, unspecified: Secondary | ICD-10-CM | POA: Diagnosis present

## 2013-08-21 DIAGNOSIS — I671 Cerebral aneurysm, nonruptured: Secondary | ICD-10-CM | POA: Diagnosis present

## 2013-08-21 DIAGNOSIS — J4489 Other specified chronic obstructive pulmonary disease: Secondary | ICD-10-CM | POA: Diagnosis present

## 2013-08-21 DIAGNOSIS — F329 Major depressive disorder, single episode, unspecified: Secondary | ICD-10-CM | POA: Diagnosis present

## 2013-08-21 DIAGNOSIS — I729 Aneurysm of unspecified site: Secondary | ICD-10-CM

## 2013-08-21 DIAGNOSIS — E785 Hyperlipidemia, unspecified: Secondary | ICD-10-CM | POA: Diagnosis present

## 2013-08-21 DIAGNOSIS — R29898 Other symptoms and signs involving the musculoskeletal system: Secondary | ICD-10-CM | POA: Diagnosis present

## 2013-08-21 DIAGNOSIS — R413 Other amnesia: Secondary | ICD-10-CM | POA: Diagnosis present

## 2013-08-21 HISTORY — PX: RADIOLOGY WITH ANESTHESIA: SHX6223

## 2013-08-21 LAB — POCT ACTIVATED CLOTTING TIME
Activated Clotting Time: 193 seconds
Activated Clotting Time: 221 seconds
Activated Clotting Time: 210 seconds
Activated Clotting Time: 221 seconds

## 2013-08-21 LAB — PLATELET INHIBITION P2Y12: Platelet Function  P2Y12: 164 [PRU] — ABNORMAL LOW (ref 194–418)

## 2013-08-21 LAB — PROTIME-INR
INR: 1.16 (ref 0.00–1.49)
Prothrombin Time: 14.6 seconds (ref 11.6–15.2)

## 2013-08-21 LAB — APTT: aPTT: 167 seconds — ABNORMAL HIGH (ref 24–37)

## 2013-08-21 SURGERY — RADIOLOGY WITH ANESTHESIA
Anesthesia: Monitor Anesthesia Care

## 2013-08-21 MED ORDER — CYCLOBENZAPRINE HCL 10 MG PO TABS
10.0000 mg | ORAL_TABLET | Freq: Every day | ORAL | Status: DC
  Administered 2013-08-21: 10 mg via ORAL
  Filled 2013-08-21 (×2): qty 1

## 2013-08-21 MED ORDER — ASPIRIN 325 MG PO TABS
325.0000 mg | ORAL_TABLET | Freq: Every day | ORAL | Status: DC
  Administered 2013-08-22: 325 mg via ORAL
  Filled 2013-08-21: qty 1

## 2013-08-21 MED ORDER — LEVETIRACETAM 500 MG PO TABS
500.0000 mg | ORAL_TABLET | Freq: Two times a day (BID) | ORAL | Status: DC
  Administered 2013-08-21 – 2013-08-22 (×2): 500 mg via ORAL
  Filled 2013-08-21 (×3): qty 1

## 2013-08-21 MED ORDER — IOHEXOL 300 MG/ML  SOLN
150.0000 mL | Freq: Once | INTRAMUSCULAR | Status: AC | PRN
  Administered 2013-08-21: 115 mL via INTRA_ARTERIAL

## 2013-08-21 MED ORDER — ASPIRIN 81 MG PO CHEW
CHEWABLE_TABLET | ORAL | Status: AC
  Administered 2013-08-21: 81 mg
  Filled 2013-08-21: qty 3

## 2013-08-21 MED ORDER — EPTIFIBATIDE 2 MG/ML IV SOLN
INTRAVENOUS | Status: AC
  Filled 2013-08-21: qty 10

## 2013-08-21 MED ORDER — GLYCOPYRROLATE 0.2 MG/ML IJ SOLN
INTRAMUSCULAR | Status: DC | PRN
  Administered 2013-08-21: .8 mg via INTRAVENOUS

## 2013-08-21 MED ORDER — PROPOFOL 10 MG/ML IV BOLUS
INTRAVENOUS | Status: DC | PRN
  Administered 2013-08-21: 50 mg via INTRAVENOUS
  Administered 2013-08-21: 100 mg via INTRAVENOUS

## 2013-08-21 MED ORDER — KETOROLAC TROMETHAMINE 30 MG/ML IJ SOLN
30.0000 mg | Freq: Four times a day (QID) | INTRAMUSCULAR | Status: DC | PRN
  Administered 2013-08-21 – 2013-08-22 (×3): 30 mg via INTRAVENOUS
  Filled 2013-08-21 (×3): qty 1

## 2013-08-21 MED ORDER — ROCURONIUM BROMIDE 100 MG/10ML IV SOLN
INTRAVENOUS | Status: DC | PRN
  Administered 2013-08-21: 40 mg via INTRAVENOUS

## 2013-08-21 MED ORDER — HEPARIN (PORCINE) IN NACL 100-0.45 UNIT/ML-% IJ SOLN
400.0000 [IU]/h | INTRAMUSCULAR | Status: DC
  Filled 2013-08-21: qty 250

## 2013-08-21 MED ORDER — ONDANSETRON HCL 4 MG/2ML IJ SOLN
INTRAMUSCULAR | Status: DC | PRN
  Administered 2013-08-21: 4 mg via INTRAVENOUS

## 2013-08-21 MED ORDER — PROTAMINE SULFATE 10 MG/ML IV SOLN
INTRAVENOUS | Status: DC | PRN
  Administered 2013-08-21: 5 mg via INTRAVENOUS

## 2013-08-21 MED ORDER — CEFAZOLIN SODIUM-DEXTROSE 2-3 GM-% IV SOLR
INTRAVENOUS | Status: AC
  Administered 2013-08-21: 2 g via INTRAVENOUS
  Filled 2013-08-21: qty 50

## 2013-08-21 MED ORDER — FENTANYL CITRATE 0.05 MG/ML IJ SOLN
INTRAMUSCULAR | Status: AC
  Filled 2013-08-21: qty 2

## 2013-08-21 MED ORDER — LEVOTHYROXINE SODIUM 50 MCG PO TABS
50.0000 ug | ORAL_TABLET | Freq: Every day | ORAL | Status: DC
  Administered 2013-08-22: 50 ug via ORAL
  Filled 2013-08-21 (×2): qty 1

## 2013-08-21 MED ORDER — SODIUM CHLORIDE 0.9 % IV SOLN: Freq: Once | INTRAVENOUS | Status: DC

## 2013-08-21 MED ORDER — PHENYLEPHRINE HCL 10 MG/ML IJ SOLN
10.0000 mg | INTRAVENOUS | Status: DC | PRN
  Administered 2013-08-21: 10 ug/min via INTRAVENOUS

## 2013-08-21 MED ORDER — ACETAMINOPHEN 500 MG PO TABS
1000.0000 mg | ORAL_TABLET | Freq: Four times a day (QID) | ORAL | Status: DC | PRN
  Administered 2013-08-21 – 2013-08-22 (×2): 1000 mg via ORAL
  Filled 2013-08-21 (×2): qty 2

## 2013-08-21 MED ORDER — NIMODIPINE 30 MG PO CAPS: 60.0000 mg | ORAL_CAPSULE | ORAL | Status: DC

## 2013-08-21 MED ORDER — OXYCODONE HCL 5 MG PO TABS
ORAL_TABLET | ORAL | Status: AC
  Filled 2013-08-21: qty 1

## 2013-08-21 MED ORDER — ONDANSETRON HCL 4 MG/2ML IJ SOLN: 4.0000 mg | Freq: Four times a day (QID) | INTRAMUSCULAR | Status: DC | PRN

## 2013-08-21 MED ORDER — CLOPIDOGREL BISULFATE 75 MG PO TABS
75.0000 mg | ORAL_TABLET | Freq: Every day | ORAL | Status: DC
  Administered 2013-08-22: 75 mg via ORAL
  Filled 2013-08-21 (×2): qty 1

## 2013-08-21 MED ORDER — OXYCODONE HCL 5 MG PO TABS
5.0000 mg | ORAL_TABLET | Freq: Once | ORAL | Status: AC | PRN
  Administered 2013-08-21: 5 mg via ORAL

## 2013-08-21 MED ORDER — FENTANYL CITRATE 0.05 MG/ML IJ SOLN
INTRAMUSCULAR | Status: DC | PRN
Start: 2013-08-21 — End: 2013-08-21
  Administered 2013-08-21: 100 ug via INTRAVENOUS
  Administered 2013-08-21 (×3): 50 ug via INTRAVENOUS

## 2013-08-21 MED ORDER — HEPARIN (PORCINE) IN NACL 100-0.45 UNIT/ML-% IJ SOLN
500.0000 [IU]/h | INTRAMUSCULAR | Status: DC
  Administered 2013-08-21: 500 [IU]/h via INTRAVENOUS
  Filled 2013-08-21 (×2): qty 250

## 2013-08-21 MED ORDER — ASPIRIN 81 MG PO CHEW: 81.0000 mg | CHEWABLE_TABLET | Freq: Once | ORAL | Status: DC

## 2013-08-21 MED ORDER — ASPIRIN 81 MG PO CHEW
162.0000 mg | CHEWABLE_TABLET | Freq: Once | ORAL | Status: AC
  Administered 2013-08-21: 162 mg via ORAL

## 2013-08-21 MED ORDER — ARTIFICIAL TEARS OP OINT
TOPICAL_OINTMENT | OPHTHALMIC | Status: DC | PRN
  Administered 2013-08-21: 1 via OPHTHALMIC

## 2013-08-21 MED ORDER — LACTATED RINGERS IV SOLN
INTRAVENOUS | Status: DC | PRN
  Administered 2013-08-21: 09:00:00 via INTRAVENOUS

## 2013-08-21 MED ORDER — NEOSTIGMINE METHYLSULFATE 1 MG/ML IJ SOLN
INTRAMUSCULAR | Status: DC | PRN
  Administered 2013-08-21: 5 mg via INTRAVENOUS

## 2013-08-21 MED ORDER — SODIUM CHLORIDE 0.9 % IV SOLN
INTRAVENOUS | Status: DC
  Administered 2013-08-21: 16:00:00 via INTRAVENOUS

## 2013-08-21 MED ORDER — PROMETHAZINE HCL 25 MG/ML IJ SOLN: 6.2500 mg | INTRAMUSCULAR | Status: DC | PRN

## 2013-08-21 MED ORDER — LIDOCAINE HCL (CARDIAC) 20 MG/ML IV SOLN
INTRAVENOUS | Status: DC | PRN
  Administered 2013-08-21: 100 mg via INTRAVENOUS

## 2013-08-21 MED ORDER — CEFAZOLIN SODIUM-DEXTROSE 2-3 GM-% IV SOLR: 2.0000 g | Freq: Once | INTRAVENOUS | Status: DC

## 2013-08-21 MED ORDER — NICARDIPINE HCL IN NACL 20-0.86 MG/200ML-% IV SOLN: 5.0000 mg/h | INTRAVENOUS | Status: DC

## 2013-08-21 MED ORDER — CLOPIDOGREL BISULFATE 75 MG PO TABS
75.0000 mg | ORAL_TABLET | Freq: Every day | ORAL | Status: DC
  Filled 2013-08-21: qty 1

## 2013-08-21 MED ORDER — FENTANYL CITRATE 0.05 MG/ML IJ SOLN
25.0000 ug | INTRAMUSCULAR | Status: DC | PRN
  Administered 2013-08-21: 25 ug via INTRAVENOUS
  Administered 2013-08-21: 50 ug via INTRAVENOUS

## 2013-08-21 MED ORDER — OXYCODONE HCL 5 MG/5ML PO SOLN: 5.0000 mg | Freq: Once | ORAL | Status: AC | PRN

## 2013-08-21 MED ORDER — HEPARIN SODIUM (PORCINE) 1000 UNIT/ML IJ SOLN
INTRAMUSCULAR | Status: DC | PRN
  Administered 2013-08-21: 3000 [IU] via INTRAVENOUS
  Administered 2013-08-21: 500 [IU] via INTRAVENOUS

## 2013-08-21 MED ORDER — CITALOPRAM HYDROBROMIDE 40 MG PO TABS
40.0000 mg | ORAL_TABLET | Freq: Every day | ORAL | Status: DC
  Filled 2013-08-21 (×2): qty 1

## 2013-08-21 MED ORDER — ACETAMINOPHEN 650 MG RE SUPP: 650.0000 mg | Freq: Four times a day (QID) | RECTAL | Status: DC | PRN

## 2013-08-21 MED ORDER — LACTATED RINGERS IV SOLN
INTRAVENOUS | Status: DC | PRN
  Administered 2013-08-21: 08:00:00 via INTRAVENOUS

## 2013-08-21 MED ORDER — LABETALOL HCL 5 MG/ML IV SOLN
INTRAVENOUS | Status: DC | PRN
  Administered 2013-08-21: 5 mg via INTRAVENOUS

## 2013-08-21 MED ORDER — CLOPIDOGREL BISULFATE 75 MG PO TABS: 75.0000 mg | ORAL_TABLET | ORAL | Status: DC

## 2013-08-21 MED ORDER — ALPRAZOLAM 0.25 MG PO TABS: 0.2500 mg | ORAL_TABLET | Freq: Four times a day (QID) | ORAL | Status: DC | PRN

## 2013-08-21 MED ORDER — PROPOFOL 10 MG/ML IV BOLUS: INTRAVENOUS | Status: DC | PRN

## 2013-08-21 MED ORDER — NITROGLYCERIN 1 MG/10 ML FOR IR/CATH LAB
INTRA_ARTERIAL | Status: AC
  Filled 2013-08-21: qty 10

## 2013-08-21 MED ORDER — MEPERIDINE HCL 25 MG/ML IJ SOLN: 6.2500 mg | INTRAMUSCULAR | Status: DC | PRN

## 2013-08-21 MED ORDER — ASPIRIN EC 325 MG PO TBEC: 325.0000 mg | DELAYED_RELEASE_TABLET | Freq: Once | ORAL | Status: DC

## 2013-08-21 NOTE — Anesthesia Preprocedure Evaluation (Addendum)
Anesthesia Evaluation  Patient identified by MRN, date of birth, ID band Patient awake    Reviewed: Allergy & Precautions, H&P , NPO status , Patient's Chart, lab work & pertinent test results  History of Anesthesia Complications Negative for: history of anesthetic complications  Airway Mallampati: II TM Distance: >3 FB Neck ROM: Full    Dental  (+) Dental Advisory Given   Pulmonary asthma , COPDformer smoker,  breath sounds clear to auscultation  Pulmonary exam normal       Cardiovascular hypertension, Pt. on medications Rhythm:Regular Rate:Normal  '11 ECHO: normal LVF, EF 65-70%, valves OK   Neuro/Psych Seizures -, Well Controlled,  Anxiety Depression Cerebral aneurysm CVA (R weakness, L facial droop), Residual Symptoms    GI/Hepatic Neg liver ROS, GERD-  Medicated and Controlled,  Endo/Other  Hypothyroidism   Renal/GU negative Renal ROS     Musculoskeletal   Abdominal   Peds  Hematology negative hematology ROS (+)   Anesthesia Other Findings   Reproductive/Obstetrics                          Anesthesia Physical Anesthesia Plan  ASA: III  Anesthesia Plan: General and MAC   Post-op Pain Management:    Induction: Intravenous  Airway Management Planned: Oral ETT  Additional Equipment: Arterial line  Intra-op Plan:   Post-operative Plan: Possible Post-op intubation/ventilation  Informed Consent: I have reviewed the patients History and Physical, chart, labs and discussed the procedure including the risks, benefits and alternatives for the proposed anesthesia with the patient or authorized representative who has indicated his/her understanding and acceptance.   Dental advisory given  Plan Discussed with: CRNA and Surgeon  Anesthesia Plan Comments: (Plan routine monitors, A-line, MAC for angiogram, progressing to GETA if needed for coiling)        Anesthesia Quick  Evaluation

## 2013-08-21 NOTE — Progress Notes (Signed)
Subjective: Patient c/o left frontal 4/10 headache, she denies any right groin pain or abdominal pain. She denies any difficulty with speech. She states she is tolerating some ice chips without N/V.  Objective: Physical Exam: BP 74/52  Pulse 85  Temp(Src) 97 F (36.1 C)  Resp 16  Ht 5\' 2"  (1.575 m)  Wt 140 lb 3.4 oz (63.6 kg)  BMI 25.64 kg/m2  SpO2 100%  General: A&Ox3, NAD Abd: Soft, NT, ND Extremities: Right groin site soft with recent saturated gauze dressing change at 1530, new clean pressure dressing applied, no bleeding or hematoma seen, DP intact biaterally. RLE dorsiflexion 4/5 compared to LLE 5/5, RUE digit contraction Neuro: EOMI, no ataxia, PERRLA, right facial droop and right eyelid droop, left upper quadrant questionable visual deficit, speech clear  Labs: CBC  Recent Labs  08/20/13 1057  WBC 6.3  HGB 13.2  HCT 40.7  PLT 205   BMET  Recent Labs  08/20/13 1057  NA 140  K 4.5  CL 106  CO2 24  GLUCOSE 94  BUN 18  CREATININE 0.55  CALCIUM 9.9   LFT  Recent Labs  08/20/13 1057  PROT 7.4  ALBUMIN 3.7  AST 34  ALT 24  ALKPHOS 90  BILITOT 0.5   PT/INR  Recent Labs  08/20/13 1057  LABPROT 12.0  INR 0.90    Studies/Results: No results found.  Assessment/Plan: S/P Lt Vertebral arteriogram, followed by placement of a 3.87mm x 16 mm pipeline flow diverter across Lt PICA aneurysm. IV heparin until 0700 08/22/13, aspirin 325mg  and plavix 75mg  daily. Rebleed of right groin dressing changed 1530, flat until 1930 unless rebleed again. Ice chips with slow progression of liquids tonight.  Will order Toradol 30mg  IV q6hr prn pain.  Possible discharge in am if stable.  Patient was seen and examined with Dr. Corliss Skains today.    LOS: 0 days    Cloretta Ned 08/21/2013 4:16 PM

## 2013-08-21 NOTE — Procedures (Signed)
S/P Lt Vertebral arteriogram,followe dby placement of a 3.98mm x 16 mm pipeline flow diverter across Lt PICA aneurysm.

## 2013-08-21 NOTE — Anesthesia Postprocedure Evaluation (Signed)
  Anesthesia Post-op Note  Patient: [name]  Procedure(s) Performed: Procedure(s): RADIOLOGY WITH ANESTHESIA (N/A)  Patient Location: SICU (neurosurgical)  Anesthesia Type:General  Level of Consciousness: awake, alert , oriented and patient cooperative  Airway and Oxygen Therapy: Patient Spontanous Breathing  Post-op Pain: none  Post-op Assessment: Post-op Vital signs reviewed, Patient's Cardiovascular Status Stable, Respiratory Function Stable, Patent Airway, No signs of Nausea or vomiting, Adequate PO intake and Pain level controlled  Post-op Vital Signs: Reviewed and stable  Complications: No apparent anesthesia complications

## 2013-08-21 NOTE — H&P (Signed)
[name] is an 57 y.o. female.   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 Follow up arteriograms continued to be unchanged and stable x 3 yrs. Pt remained asymptomatic approx 6 months ago she complained of increase in headaches. Cerebral arteriogram performed 05/21/13 revealed slight increase in size of both aneurysms. Scheduled now for cerebral arteriogram with possible embolization of one or both aneurysms.  HPI: COPD; HLD; cerebral aneurysms; HTN; CVA; short term memory loss  Past Medical History  Diagnosis Date  . Asthma   . COPD (chronic obstructive pulmonary disease)      - PFTs  01/24/06 FEV1 69% ratio 68% diffusing capacity 57% with no improvement after B2   - PFT's 10/1/ 09 FEV1 77   ratio  76    dlco                   45            no resp to B2    - Nl alpha one antitripsin level 10/09  . Hyperlipidemia   . Aneurysm   . Depression   . Anxiety   . Headache(784.0)   . Hypertension   . Hypothyroidism   . Pneumonia     hx  . Stroke 2011    rt sided weakness  . Anemia     hx  . History of kidney stones   . Seizure disorder, secondary     2-3 focal siezures monthly  . Seizures   . Short-term memory loss     since stroke    Past Surgical History  Procedure Laterality Date  . Laparoscopic hysterectomy    . Mandible fracture surgery    . Back surgery    . Cerebral angiogram      x3  . Stones      no surgery    Family History  Problem Relation Age of Onset  . Heart disease Father   . Heart attack Father   . Lung cancer    . Heart disease Mother    Social History:  reports that she quit smoking about 3 years ago. She has never used smokeless tobacco. She reports that she does not drink alcohol or use illicit drugs.  Allergies:  Allergies   Allergen Reactions  . Phenytoin Hives and Swelling     (Not in a hospital admission)  Results for orders placed during the hospital encounter of 08/20/13 (from the past 48 hour(s))  CBC WITH DIFFERENTIAL     Status: None   Collection Time    08/20/13 10:57 AM      Result Value Range   WBC 6.3  4.0 - 10.5 K/uL   RBC 4.42  3.87 - 5.11 MIL/uL   Hemoglobin 13.2  12.0 - 15.0 g/dL   HCT 16.1  09.6 - 04.5 %   MCV 92.1  78.0 - 100.0 fL   MCH 29.9  26.0 - 34.0 pg   MCHC 32.4  30.0 - 36.0 g/dL   RDW 40.9  81.1 - 91.4 %   Platelets 205  150 - 400 K/uL   Neutrophils Relative % 53  43 - 77 %   Neutro Abs 3.3  1.7 - 7.7 K/uL   Lymphocytes Relative 35  12 - 46 %   Lymphs Abs 2.2  0.7 - 4.0 K/uL  Monocytes Relative 9  3 - 12 %   Monocytes Absolute 0.6  0.1 - 1.0 K/uL   Eosinophils Relative 2  0 - 5 %   Eosinophils Absolute 0.1  0.0 - 0.7 K/uL   Basophils Relative 0  0 - 1 %   Basophils Absolute 0.0  0.0 - 0.1 K/uL  PROTIME-INR     Status: None   Collection Time    08/20/13 10:57 AM      Result Value Range   Prothrombin Time 12.0  11.6 - 15.2 seconds   INR 0.90  0.00 - 1.49  APTT     Status: None   Collection Time    08/20/13 10:57 AM      Result Value Range   aPTT 30  24 - 37 seconds  COMPREHENSIVE METABOLIC PANEL     Status: None   Collection Time    08/20/13 10:57 AM      Result Value Range   Sodium 140  135 - 145 mEq/L   Potassium 4.5  3.5 - 5.1 mEq/L   Chloride 106  96 - 112 mEq/L   CO2 24  19 - 32 mEq/L   Glucose, Bld 94  70 - 99 mg/dL   BUN 18  6 - 23 mg/dL   Creatinine, Ser 1.61  0.50 - 1.10 mg/dL   Calcium 9.9  8.4 - 09.6 mg/dL   Total Protein 7.4  6.0 - 8.3 g/dL   Albumin 3.7  3.5 - 5.2 g/dL   AST 34  0 - 37 U/L   ALT 24  0 - 35 U/L   Alkaline Phosphatase 90  39 - 117 U/L   Total Bilirubin 0.5  0.3 - 1.2 mg/dL   GFR calc non Af Amer >90  >90 mL/min   GFR calc Af Amer >90  >90 mL/min   Comment: (NOTE)     The eGFR has been calculated using the CKD EPI  equation.     This calculation has not been validated in all clinical situations.     eGFR's persistently <90 mL/min signify possible Chronic Kidney     Disease.   No results found.  Review of Systems  Constitutional: Negative for fever, chills and weight loss.  HENT: Negative for hearing loss and tinnitus.        Only occasional headaches  Eyes: Negative for blurred vision and double vision.  Respiratory: Negative for cough and shortness of breath.   Cardiovascular: Negative for chest pain.  Gastrointestinal: Negative for nausea, vomiting and abdominal pain.  Neurological: Positive for dizziness, weakness and headaches. Negative for tingling and seizures.       Occasional "balance issues"  Psychiatric/Behavioral: Positive for memory loss.       Short term loss/difficulties    There were no vitals taken for this visit. Physical Exam  Constitutional: She is oriented to person, place, and time. She appears well-developed and well-nourished.  HENT:  Head: Atraumatic.  Smile has slight Rt sided droop  Eyes: EOM are normal.  Neck: Normal range of motion. Neck supple.  Cardiovascular: Normal rate, regular rhythm and normal heart sounds.   No murmur heard. Respiratory: Effort normal and breath sounds normal. She has no wheezes.  GI: Soft. Bowel sounds are normal. There is no tenderness.  Musculoskeletal: Normal range of motion.  Rt side weaker: upper and lower extr  Neurological: She is alert and oriented to person, place, and time. Coordination abnormal.  Gait is mildly imbalanced- uses cane  Skin: Skin is warm and dry.  Psychiatric: She has a normal mood and affect. Her behavior is normal. Judgment and thought content normal.     Assessment/Plan CVA/ intracranial hemorrhage 11/2009/ HTN Arteriogram then revealed L pericallosal and L inferior posterior cerebral artery aneurysms- Unruptured Followed since then- has remained asymptomatic with stable findings on angio Developed  headaches with new finding on 05/2013 arteriogram-- Slight increase in size of both aneurysms Scheduled now for possible embolization Pt and husband aware of procedure benefits and risks and agreeable to proceed Consent signed and in chart Understand she will be admitted overnight in Neuro ICU if intervention proceeds   Marissa Lowrey A 08/21/2013, 7:56 AM

## 2013-08-21 NOTE — Progress Notes (Signed)
ANTICOAGULATION CONSULT NOTE - Initial Consult  Pharmacy Consult for Heparn Indication: Dr. Corliss Skains procedure  Allergies  Allergen Reactions  . Phenytoin Hives and Swelling    Patient Measurements: Weight = 62 kg  Vital Signs: Temp: 97.5 F (36.4 C) (12/17 0639) BP: 109/69 mmHg (12/17 0703) Pulse Rate: 77 (12/17 0703)  Labs:  Recent Labs  08/20/13 1057  HGB 13.2  HCT 40.7  PLT 205  APTT 30  LABPROT 12.0  INR 0.90  CREATININE 0.55    The CrCl is unknown because both a height and weight (above a minimum accepted value) are required for this calculation.   Medical History: Past Medical History  Diagnosis Date  . Asthma   . COPD (chronic obstructive pulmonary disease)      - PFTs  01/24/06 FEV1 69% ratio 68% diffusing capacity 57% with no improvement after B2   - PFT's 10/1/ 09 FEV1 77   ratio  76    dlco                   45            no resp to B2    - Nl alpha one antitripsin level 10/09  . Hyperlipidemia   . Aneurysm   . Depression   . Anxiety   . Headache(784.0)   . Hypertension   . Hypothyroidism   . Pneumonia     hx  . Stroke 2011    rt sided weakness  . Anemia     hx  . History of kidney stones   . Seizure disorder, secondary     2-3 focal siezures monthly  . Seizures   . Short-term memory loss     since stroke    Assessment: 57 year old female to begin heparin s/p endovascular treatment of aneurysm  Goal of Therapy:  Heparin level 0.1-0.2 units/ml Monitor platelets by anticoagulation protocol: Yes   Plan:  1) Continue heparin at 500 units / hr 2) Heparin level 8 hours after heparin begins  3) Heparin to stop at 7 am  Thank you. Okey Regal, PharmD  08/21/2013,1:14 PM

## 2013-08-21 NOTE — Progress Notes (Signed)
Pt. Up from interventional with rt groin site bleeding, IR staff up to redress site

## 2013-08-21 NOTE — Progress Notes (Addendum)
ANTICOAGULATION CONSULT NOTE  Pharmacy Consult for Heparn Indication: Dr. Corliss Skains procedure  Allergies  Allergen Reactions  . Phenytoin Hives and Swelling    Patient Measurements: Weight = 62 kg  Vital Signs: Temp: 96.6 F (35.9 C) (12/17 1600) Temp src: Axillary (12/17 1600) BP: 119/62 mmHg (12/17 2000) Pulse Rate: 79 (12/17 2000)  Labs:  Recent Labs  08/20/13 1057 08/21/13 1730  HGB 13.2  --   HCT 40.7  --   PLT 205  --   APTT 30 167*  LABPROT 12.0 14.6  INR 0.90 1.16  CREATININE 0.55  --     Estimated Creatinine Clearance: 68.8 ml/min (by C-G formula based on Cr of 0.55).   Assessment: 57 year old female to begin heparin s/p endovascular treatment of aneurysm. APTT was ordered by Dr. Corliss Skains and resulted elevated at 167 seconds. Per RN, orders received from Dr. Corliss Skains to hold heparin x1 hour and then resume at rate determine by pharmacy.  Goal of Therapy:  APTT 60-70 seconds per consult Heparin level 0.1-0.25 units/ml Monitor platelets by anticoagulation protocol: Yes   Plan:  1. Reduce heparin to 400 units / hr 2. Heparin level and aPTT 6 hours after heparin restarts  3. Heparin to stop at 7 am   [name], PharmD, BCPS Clinical Pharmacist Pager: [phone] 08/21/2013 8:40 PM

## 2013-08-21 NOTE — Transfer of Care (Signed)
Immediate Anesthesia Transfer of Care Note  Patient: [name]  Procedure(s) Performed: Procedure(s): RADIOLOGY WITH ANESTHESIA (N/A)  Patient Location: PACU  Anesthesia Type:General  Level of Consciousness: awake, oriented and patient cooperative  Airway & Oxygen Therapy: Patient Spontanous Breathing and Patient connected to face mask oxygen  Post-op Assessment: Report given to PACU RN, Post -op Vital signs reviewed and stable and Patient moving all extremities X 4  Post vital signs: Reviewed and stable  Complications: No apparent anesthesia complications

## 2013-08-21 NOTE — Progress Notes (Signed)
Pt. With facial droop on rt since she has been in pacu, rt groin dressing remains cdi since IR here to redress.

## 2013-08-21 NOTE — Preoperative (Signed)
Beta Blockers   Reason not to administer Beta Blockers:Not Applicable 

## 2013-08-21 NOTE — Anesthesia Procedure Notes (Signed)
Procedure Name: Intubation Date/Time: 08/21/2013 10:04 AM Performed by: Sherie Don Pre-anesthesia Checklist: Patient identified, Emergency Drugs available, Suction available, Timeout performed and Patient being monitored Patient Re-evaluated:Patient Re-evaluated prior to inductionOxygen Delivery Method: Circle system utilized Preoxygenation: Pre-oxygenation with 100% oxygen Intubation Type: IV induction Ventilation: Mask ventilation without difficulty Laryngoscope Size: Mac and 3 Grade View: Grade I Tube type: Oral Tube size: 7.0 mm Number of attempts: 1 Airway Equipment and Method: Stylet Placement Confirmation: positive ETCO2,  ETT inserted through vocal cords under direct vision and breath sounds checked- equal and bilateral Secured at: 22 cm Tube secured with: Tape Dental Injury: Teeth and Oropharynx as per pre-operative assessment

## 2013-08-22 ENCOUNTER — Encounter (HOSPITAL_COMMUNITY): Payer: Self-pay | Admitting: Interventional Radiology

## 2013-08-22 LAB — CBC WITH DIFFERENTIAL/PLATELET
Basophils Absolute: 0 10*3/uL (ref 0.0–0.1)
Basophils Relative: 0 % (ref 0–1)
Eosinophils Absolute: 0.1 10*3/uL (ref 0.0–0.7)
Eosinophils Relative: 2 % (ref 0–5)
HCT: 27.7 % — ABNORMAL LOW (ref 36.0–46.0)
Hemoglobin: 9 g/dL — ABNORMAL LOW (ref 12.0–15.0)
Lymphocytes Relative: 33 % (ref 12–46)
Lymphs Abs: 2.1 10*3/uL (ref 0.7–4.0)
MCH: 30 pg (ref 26.0–34.0)
MCHC: 32.5 g/dL (ref 30.0–36.0)
MCV: 92.3 fL (ref 78.0–100.0)
Monocytes Absolute: 0.6 10*3/uL (ref 0.1–1.0)
Monocytes Relative: 9 % (ref 3–12)
Neutro Abs: 3.4 10*3/uL (ref 1.7–7.7)
Neutrophils Relative %: 56 % (ref 43–77)
Platelets: 141 10*3/uL — ABNORMAL LOW (ref 150–400)
RBC: 3 MIL/uL — ABNORMAL LOW (ref 3.87–5.11)
RDW: 13.6 % (ref 11.5–15.5)
WBC: 6.2 10*3/uL (ref 4.0–10.5)

## 2013-08-22 LAB — BASIC METABOLIC PANEL
BUN: 11 mg/dL (ref 6–23)
CO2: 26 mEq/L (ref 19–32)
Calcium: 8.2 mg/dL — ABNORMAL LOW (ref 8.4–10.5)
Chloride: 112 mEq/L (ref 96–112)
Creatinine, Ser: 0.61 mg/dL (ref 0.50–1.10)
GFR calc Af Amer: >90 mL/min (ref >90)
GFR calc non Af Amer: >90 mL/min (ref >90)
Glucose, Bld: 87 mg/dL (ref 70–99)
Potassium: 3.7 mEq/L (ref 3.5–5.1)
Sodium: 143 mEq/L (ref 135–145)

## 2013-08-22 LAB — CBC
HCT: 30.9 % — ABNORMAL LOW (ref 36.0–46.0)
Hemoglobin: 9.7 g/dL — ABNORMAL LOW (ref 12.0–15.0)
MCH: 29.7 pg (ref 26.0–34.0)
MCHC: 31.4 g/dL (ref 30.0–36.0)
MCV: 94.5 fL (ref 78.0–100.0)
Platelets: 161 10*3/uL (ref 150–400)
RBC: 3.27 MIL/uL — ABNORMAL LOW (ref 3.87–5.11)
RDW: 13.7 % (ref 11.5–15.5)
WBC: 5.7 10*3/uL (ref 4.0–10.5)

## 2013-08-22 LAB — APTT: aPTT: 41 seconds — ABNORMAL HIGH (ref 24–37)

## 2013-08-22 LAB — HEPARIN LEVEL (UNFRACTIONATED): Heparin Unfractionated: <0.1 IU/mL — ABNORMAL LOW (ref 0.30–0.70)

## 2013-08-22 NOTE — Discharge Summary (Signed)
Physician Discharge Summary  Patient ID: [name] MRN: [id number] DOB/AGE: [birth year] 57 y.o.  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 Pt was asymptomatic then and follow ups x 3 yrs were stable. Approximately 6 months ago pt noticed new onset headaches. Cerebral arteriogram performed 05/21/2013 revealed enlarging aneurysms.  She was scheduled for intervention and performed 08/21/13.  Pipeline stent was placed over L PICA aneurysm without complication. Was admitted to Neuro ICU overnight.  Pt did well overnight except did have oozing from Rt groin site for several hrs.  Bandage was changed periodically. Heparin was turned off for over an hr. Oozing did resolve; clean and dry dressing this am.  She complained of slight headache last night- resolved this am. Pt has no complaints now. Eating well; slept ok; no N/V. Good UOP- yellow.  Labs reviewed with Dr Corliss Skains. Dr Corliss Skains has seen and examined pt. Continue Plavix 75 mg and ASA 325 mg daily. Return for follow up in 2 weeks  Consults: None  Significant Diagnostic Studies: Cerebral arteriogram  Treatments: L posterior inferior cerebral artery pipeline stent placement  Discharge Exam: Blood pressure 115/51, pulse 79, temperature 97.7 F (36.5 C), temperature source Oral, resp. rate 11, height 5\' 2"  (1.575 m), weight 140 lb 3.4 oz (63.6 kg), SpO2 100.00%.  Results for orders placed during the hospital encounter of 08/21/13  BASIC  METABOLIC PANEL      Result Value Range   Sodium 143  135 - 145 mEq/L   Potassium 3.7  3.5 - 5.1 mEq/L   Chloride 112  96 - 112 mEq/L   CO2 26  19 - 32 mEq/L   Glucose, Bld 87  70 - 99 mg/dL   BUN 11  6 - 23 mg/dL   Creatinine, Ser 2.72  0.50 - 1.10 mg/dL   Calcium 8.2 (*) 8.4 - 10.5 mg/dL   GFR calc non Af Amer >90  >90 mL/min   GFR calc Af Amer >90  >90 mL/min  CBC WITH DIFFERENTIAL      Result Value Range   WBC 6.2  4.0 - 10.5 K/uL   RBC 3.00 (*) 3.87 - 5.11 MIL/uL   Hemoglobin 9.0 (*) 12.0 - 15.0 g/dL   HCT 53.6 (*) 64.4 - 03.4 %   MCV 92.3  78.0 - 100.0 fL   MCH 30.0  26.0 - 34.0 pg   MCHC 32.5  30.0 - 36.0 g/dL   RDW 74.2  59.5 - 63.8 %   Platelets 141 (*) 150 - 400 K/uL   Neutrophils Relative % 56  43 - 77 %   Neutro Abs 3.4  1.7 - 7.7 K/uL   Lymphocytes Relative 33  12 - 46 %   Lymphs Abs 2.1  0.7 - 4.0 K/uL   Monocytes Relative 9  3 - 12 %   Monocytes Absolute 0.6  0.1 - 1.0 K/uL   Eosinophils Relative 2  0 - 5 %   Eosinophils Absolute 0.1  0.0 - 0.7 K/uL   Basophils Relative 0  0 - 1 %   Basophils Absolute 0.0  0.0 - 0.1  K/uL  PROTIME-INR      Result Value Range   Prothrombin Time 14.6  11.6 - 15.2 seconds   INR 1.16  0.00 - 1.49  APTT      Result Value Range   aPTT 167 (*) 24 - 37 seconds  APTT      Result Value Range   aPTT 41 (*) 24 - 37 seconds  HEPARIN LEVEL (UNFRACTIONATED)      Result Value Range   Heparin Unfractionated <0.10 (*) 0.30 - 0.70 IU/mL   PE:  A/O; EOM; pleasant Tongue midline Sl Rt facial droop- since 2011 Good peripheral vision Moves all 4s; Rt side weaker - since 2011 Good sensation Heart: RRR Lungs: CTA Abd: soft; +BS; NT Rt groin: NT; no bleeding; Noo hematoma Rt foot: 1+ pulses  Disposition: L PICA aneurysm pipeline stent placed in IR with Dr Corliss Skains 08/21/2013 Pt doing well Will dc now to home Continue all home meds Plavix and ASA daily Return for follow up 2 weeks Pt has good understanding of all dc  instructions Rx: Plavix 75 mg #30; 2 refills  Discharge Orders   Future Appointments Provider Department Dept Phone   09/19/2013 1:30 PM Nilda Riggs, NP [facility] [phone]   Future Orders Complete By Expires   IR Radiologist Eval & Mgmt  09/12/2013 10/23/2014   Questions:     Is the patient pregnant?:  No   Preferred Imaging Location?:  [facility]   Reason for Exam (SYMPTOM  OR DIAGNOSIS REQUIRED):  2 week follow up with Dr Corliss Skains   Call MD for:  difficulty breathing, headache or visual disturbances  As directed    Call MD for:  extreme fatigue  As directed    Call MD for:  persistant dizziness or light-headedness  As directed    Call MD for:  persistant nausea and vomiting  As directed    Call MD for:  redness, tenderness, or signs of infection (pain, swelling, redness, odor or green/yellow discharge around incision site)  As directed    Call MD for:  severe uncontrolled pain  As directed    Call MD for:  temperature >100.4  As directed    Diet - low sodium heart healthy  As directed    Discharge instructions  As directed    Comments:     2 week follow up with Dr Corliss Skains- scheduler will call pt with time and date; continue Plavix and ASA 325 daily; call [phone] with issues or questions   Discharge wound care:  As directed    Comments:     May shower tomorrow; change bandage on groin to bandaid- change bandaid daily x 1 week   Driving Restrictions  As directed    Comments:     No driving x 2 weeks   Increase activity slowly  As directed    Lifting restrictions  As directed    Comments:     No lifting over 10 lbs x 2 weeks       Medication List         ALPRAZolam 0.25 MG tablet  Commonly known as:  XANAX  Take 0.25 mg by mouth every 6 (six) hours as needed for anxiety or sleep.     aspirin 81 MG EC tablet  Take 81 mg by mouth every morning.     Biotin 5000 MCG Tabs  Take 1 tablet by mouth 2 (two) times daily.     CALCIUM 500  +D 500-400  MG-UNIT Tabs  Generic drug:  Calcium Carb-Cholecalciferol  Take 1 tablet by mouth daily.     citalopram 40 MG tablet  Commonly known as:  CELEXA  Take 40 mg by mouth at bedtime.     clindamycin 1 % external solution  Commonly known as:  CLEOCIN T  Apply 1 application topically 2 (two) times daily. Apply to face     clopidogrel 75 MG tablet  Commonly known as:  PLAVIX  Take 75 mg by mouth daily with breakfast.     cyclobenzaprine 10 MG tablet  Commonly known as:  FLEXERIL  Take 10 mg by mouth at bedtime.     diclofenac sodium 1 % Gel  Commonly known as:  VOLTAREN  Apply 2 g topically 3 (three) times daily as needed. For pain     famotidine 20 MG tablet  Commonly known as:  PEPCID  Take 20 mg by mouth 2 (two) times daily.     levETIRAcetam 500 MG tablet  Commonly known as:  KEPPRA  Take 1 tablet (500 mg total) by mouth every 12 (twelve) hours.     levothyroxine 50 MCG tablet  Commonly known as:  SYNTHROID, LEVOTHROID  Take 50 mcg by mouth daily before breakfast.     losartan 100 MG tablet  Commonly known as:  COZAAR  Take 100 mg by mouth every morning.     rosuvastatin 10 MG tablet  Commonly known as:  CRESTOR  Take 10 mg by mouth at bedtime.     SUPER B COMPLEX PO  Take 1 tablet by mouth daily.     SUPER CRANBERRY/VITAMIN D3 PO  Take 1 tablet by mouth daily.     topiramate 25 MG tablet  Commonly known as:  TOPAMAX  Take 25-50 mg by mouth 2 (two) times daily. Take 25mg  in the morning and 50mg  in the evening     vitamin B-12 1000 MCG tablet  Commonly known as:  CYANOCOBALAMIN  Take 1,000 mcg by mouth daily.     VITAMIN D-3 PO  Take 1,000 Units by mouth 2 (two) times daily.         Signed: Toron Bowring A 08/22/2013, 10:55 AM

## 2013-08-22 NOTE — Progress Notes (Signed)
1 Day Post-Op  Subjective: pOST PROCEDURE  Devoid of any complaints of N/V,motor,sensory or speech difficulties. HAs on the left sig improved. Tolerating solids. No further bleeding from rt groin. Hb 9. ?real.possble hemodilution.  Objective: Vital signs in last 24 hours: Temp:  [96.6 F (35.9 C)-97.7 F (36.5 C)] 97.7 F (36.5 C) (12/18 0800) Pulse Rate:  [53-90] 79 (12/18 0800) Resp:  [9-25] 11 (12/18 0800) BP: (74-161)/(40-133) 115/51 mmHg (12/18 0800) SpO2:  [85 %-100 %] 100 % (12/18 0800) Arterial Line BP: (104-125)/(49-59) 111/50 mmHg (12/17 1430) Weight:  [140 lb 3.4 oz (63.6 kg)] 140 lb 3.4 oz (63.6 kg) (12/17 1500)    Intake/Output from previous day: 12/17 0701 - 12/18 0700 In: 1802.5 [P.O.:600; I.V.:1202.5] Out: 2525 [Urine:2525] Intake/Output this shift: Total I/O In: 75 [I.V.:75] Out: -   On exam. Alert, awake oriented to time ,place and space.Marland Kitchen  Speech and comprehension clear.Marland Kitchen  PEARLA..3mm Rt =Lt  EOMS full.Jill Alexanders Fields.Full to confrontation.   Facial asymmetry.Rt facial and  Rt eyelid droop .  Tongue Midline..  Motor..           NO drift of outstretched arms..          Power.5/5 Proximally and distally all four extremities.. Increased tone RT UE and RT LE from previous stroke..  Fine motor and coordination to finger to nose  Decreased due to previous stroke..  Gait Not tested  Romberg. Not tested  Heel to toe. Not tested.   RT groin soft.. No palpable hematoma.         Filed Vitals:   08/22/13 0800  BP: 115/51  Pulse: 79  Temp: 97.7 F (36.5 C)  Resp: 11     Recent Labs  08/20/13 1057 08/22/13 0515  WBC 6.3 6.2  HGB 13.2 9.0*  HCT 40.7 27.7*  PLT 205 141*   BMET  Recent Labs  08/20/13 1057 08/22/13 0515  NA 140 143  K 4.5 3.7  CL 106 112  CO2 24 26  GLUCOSE 94 87  BUN 18 11  CREATININE 0.55 0.61  CALCIUM 9.9 8.2*   PT/INR  Recent Labs  08/20/13 1057 08/21/13 1730  LABPROT 12.0 14.6   INR 0.90 1.16   ABG No results found for this basename: PHART, PCO2, PO2, HCO3,  in the last 72 hours  Studies/Results: No results found.  Anti-infectives: Anti-infectives   Start     Dose/Rate Route Frequency Ordered Stop   08/21/13 0645  ceFAZolin (ANCEF) IVPB 2 g/50 mL premix  Status:  Discontinued     2 g 100 mL/hr over 30 Minutes Intravenous  Once 08/21/13 0981 08/21/13 1443   08/21/13 0635  ceFAZolin (ANCEF) 2-3 GM-% IVPB SOLR    Comments:  Christell Constant, Terri   : cabinet override      08/21/13 0635 08/21/13 1000      Assessment/Plan: s/p  Placement of pipeline flow diverter for treatment of Lt PICA aneurysm..  Plan. Sit  Out in a chair for 20 mins. Then  Ambulate with assistance. If  Tolerated may D/C to home with husband with special instruction sheety. RTC in in 2 weeks.Marland Kitchen Resume home meds and aspirin 325 mg per day and plavix 75 mg per day  Dezra Mandella K 08/22/2013

## 2013-08-22 NOTE — Progress Notes (Signed)
1 Day Post-Op  Subjective: Left posterior inferior cerebral artery aneurysm pipeline stent placed 12/17 Pt has done well overnight Only slight headache this am No other complaints Pt states Rt groin continued to be an issue last pm---oozing Controlled and dry since early am   Objective: Vital signs in last 24 hours: Temp:  [96.6 F (35.9 C)-97.7 F (36.5 C)] 97.7 F (36.5 C) (12/18 0800) Pulse Rate:  [53-90] 79 (12/18 0800) Resp:  [9-25] 11 (12/18 0800) BP: (74-161)/(40-133) 115/51 mmHg (12/18 0800) SpO2:  [85 %-100 %] 100 % (12/18 0800) Arterial Line BP: (104-125)/(49-59) 111/50 mmHg (12/17 1430) Weight:  [140 lb 3.4 oz (63.6 kg)] 140 lb 3.4 oz (63.6 kg) (12/17 1500)    Intake/Output from previous day: 12/17 0701 - 12/18 0700 In: 1802.5 [P.O.:600; I.V.:1202.5] Out: 2525 [Urine:2525] Intake/Output this shift: Total I/O In: 75 [I.V.:75] Out: -   PE: A/O; EOM Face with slight Rt droop- since CVA 2011 Pleasant; smile =; tongue midline Rt side is sl weaker- since CVA 2011 Rt groin: NT; no hematoma No bleeding; clean and dry Rt foot 1+ pulses H/H: 9/27.7 (13.3/40.7)  Lab Results:   Recent Labs  08/20/13 1057 08/22/13 0515  WBC 6.3 6.2  HGB 13.2 9.0*  HCT 40.7 27.7*  PLT 205 141*   BMET  Recent Labs  08/20/13 1057 08/22/13 0515  NA 140 143  K 4.5 3.7  CL 106 112  CO2 24 26  GLUCOSE 94 87  BUN 18 11  CREATININE 0.55 0.61  CALCIUM 9.9 8.2*   PT/INR  Recent Labs  08/20/13 1057 08/21/13 1730  LABPROT 12.0 14.6  INR 0.90 1.16   ABG No results found for this basename: PHART, PCO2, PO2, HCO3,  in the last 72 hours  Studies/Results: No results found.  Anti-infectives: Anti-infectives   Start     Dose/Rate Route Frequency Ordered Stop   08/21/13 0645  ceFAZolin (ANCEF) IVPB 2 g/50 mL premix  Status:  Discontinued     2 g 100 mL/hr over 30 Minutes Intravenous  Once 08/21/13 1308 08/21/13 1443   08/21/13 0635  ceFAZolin (ANCEF) 2-3 GM-% IVPB  SOLR    Comments:  [name], [name]   : cabinet override      08/21/13 0635 08/21/13 1000      Assessment/Plan: s/p Procedure(s): RADIOLOGY WITH ANESTHESIA (N/A)  L PICA aneurysm pipeline stent 08/21/13 Doing well overnight Oozing from Rt groin controlled Will advance diet Remove foley Remove art line Plan for probable dc today Dr Corliss Skains to see pt before dc    LOS: 1 day    [name] A 08/22/2013

## 2013-09-19 ENCOUNTER — Encounter (INDEPENDENT_AMBULATORY_CARE_PROVIDER_SITE_OTHER): Payer: Self-pay

## 2013-09-19 ENCOUNTER — Ambulatory Visit (INDEPENDENT_AMBULATORY_CARE_PROVIDER_SITE_OTHER): Payer: Medicare Other | Admitting: Nurse Practitioner

## 2013-09-19 ENCOUNTER — Encounter: Payer: Self-pay | Admitting: Nurse Practitioner

## 2013-09-19 VITALS — BP 108/76 | HR 92 | Ht 63.0 in | Wt 140.0 lb

## 2013-09-19 DIAGNOSIS — M21371 Foot drop, right foot: Secondary | ICD-10-CM

## 2013-09-19 DIAGNOSIS — I619 Nontraumatic intracerebral hemorrhage, unspecified: Secondary | ICD-10-CM

## 2013-09-19 DIAGNOSIS — R569 Unspecified convulsions: Secondary | ICD-10-CM

## 2013-09-19 DIAGNOSIS — R51 Headache: Secondary | ICD-10-CM | POA: Diagnosis not present

## 2013-09-19 DIAGNOSIS — M216X9 Other acquired deformities of unspecified foot: Secondary | ICD-10-CM

## 2013-09-19 DIAGNOSIS — R269 Unspecified abnormalities of gait and mobility: Secondary | ICD-10-CM

## 2013-09-19 MED ORDER — LEVETIRACETAM 500 MG PO TABS
500.0000 mg | ORAL_TABLET | Freq: Two times a day (BID) | ORAL | Status: DC
Start: 2013-09-19 — End: 2015-05-05

## 2013-09-19 MED ORDER — TOPIRAMATE 25 MG PO TABS: ORAL_TABLET | ORAL | Status: DC

## 2013-09-19 NOTE — Patient Instructions (Signed)
Continue Topamax at current dose will renew Continue Keppra at current dose will renew Keep a record of your spells and call me in 2 weeks Followup in 4 months

## 2013-09-19 NOTE — Progress Notes (Signed)
[facility]  PATIENT: [name] DOB: 02-Mar-1956   REASON FOR VISIT:followup for hx of intracerebral hemorrhage, seizure disorder, headaches   HISTORY OF PRESENT ILLNESS:[name], 58 year old female returns for followup. She was last seen in our office 03/19/2013. 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 She recently had left vertebral arteriogram followed by placement of pipeline stent across last PICA aneurysm by Dr. Britt Bottom on 08/21/2013. She claims her episodes of staring off have lessened since that time,  she has not kept a record. She continues with right-sided weakness from her previous stroke in 2011. She returns for routine followup  HISTORY:  of left frontopareital parenchymal hemorrhage in March 2011 from PRES and malignant hypertension.Cerebral angiogram shows no underlying AVM but small 2-3 mm incidental left pericallosal and posterior inferior cerebellar artery aneurysms.Chronic headaches with hx of migraines. 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  She returns for followup after last visit on 09/20/12. She continues to have headaches several times a week , she does not describe them as disabling.. She remains on Topamax 25mg  tab, total dose of 75mg  daily, is tolerating well without side effects but complains of mild memory loss. She underwent cerebral catheter angiogram on 10//13 by Dr.Deveshwar which showed stable appearance of pericallosal and left P2/P3 segment aneurysms. She states some mild tremor in the hand continues and it is not disabling.She complains of persistent mild right-sided weakness as well as some  temperature sensitivity and sensation of coldness of the right foot ever since her stroke in 2011.She does feel her AFO tens unit is effective. She has fallen recently.    REVIEW OF SYSTEMS: Full 14 system review of systems performed and notable only for those listed, all others are neg:  Constitutional: Fatigue Cardiovascular: Leg swelling  Ear/Nose/Throat: Trouble swallowing  Skin: N/A  Eyes: N/A  Respiratory: N/A  Gastroitestinal: N/A  Hematology/Lymphatic: Easy bruising Endocrine: N/A Musculoskeletal:N/A  Allergy/Immunology: N/A  Neurological: Weakness, facial drooping  Psychiatric: Anxiety  Sleep daytime sleepiness   ALLERGIES: Allergies  Allergen Reactions  . Phenytoin Hives and Swelling    HOME MEDICATIONS: Outpatient Prescriptions Prior to Visit  Medication Sig Dispense Refill  . ALPRAZolam (XANAX) 0.25 MG tablet Take 0.25 mg by mouth every 6 (six) hours as needed for anxiety or sleep.       . B Complex-C (SUPER B COMPLEX PO) Take 1 tablet by mouth daily.      . Biotin 5000 MCG TABS Take 1 tablet by mouth 2 (two) times daily.       . Calcium Carb-Cholecalciferol (CALCIUM 500 +D) 500-400 MG-UNIT TABS Take 1 tablet by mouth daily.      . Cholecalciferol (VITAMIN D-3 PO) Take 1,000 Units by mouth 2 (two) times daily.       . citalopram (CELEXA) 40 MG tablet Take 40 mg by mouth at bedtime.      . clindamycin (CLEOCIN T) 1 % external solution Apply 1 application topically 2 (two) times daily. Apply to face      . clopidogrel (PLAVIX) 75 MG tablet Take 75 mg by mouth daily with breakfast.      . Cranberry-Cholecalciferol (SUPER CRANBERRY/VITAMIN D3 PO) Take 1 tablet by mouth 2 (  two) times daily.       . cyclobenzaprine (FLEXERIL) 10 MG tablet Take 10 mg by mouth at bedtime.       . diclofenac sodium (VOLTAREN) 1 % GEL Apply 2 g topically 3 (three) times daily as needed. For pain      . famotidine (PEPCID) 20 MG tablet Take 20 mg by mouth 2 (two) times daily.       Marland Kitchen  levETIRAcetam (KEPPRA) 500 MG tablet Take 1 tablet (500 mg total) by mouth every 12 (twelve) hours.  60 tablet  6  . levothyroxine (SYNTHROID, LEVOTHROID) 50 MCG tablet Take 50 mcg by mouth daily before breakfast.       . losartan (COZAAR) 100 MG tablet Take 100 mg by mouth every morning.       . rosuvastatin (CRESTOR) 10 MG tablet Take 10 mg by mouth at bedtime.       . topiramate (TOPAMAX) 25 MG tablet Take 25-50 mg by mouth 2 (two) times daily. Take 25mg  in the morning and 50mg  in the evening      . vitamin B-12 (CYANOCOBALAMIN) 1000 MCG tablet Take 1,000 mcg by mouth daily.      Marland Kitchen aspirin 81 MG EC tablet Take 81 mg by mouth every morning.        No facility-administered medications prior to visit.    PAST MEDICAL HISTORY: Past Medical History  Diagnosis Date  . Asthma   . COPD (chronic obstructive pulmonary disease)      - PFTs  01/24/06 FEV1 69% ratio 68% diffusing capacity 57% with no improvement after B2   - PFT's 10/1/ 09 FEV1 77   ratio  76    dlco                   45            no resp to B2    - Nl alpha one antitripsin level 10/09  . Hyperlipidemia   . Aneurysm   . Depression   . Anxiety   . Headache(784.0)   . Hypertension   . Hypothyroidism   . Pneumonia     hx  . Stroke 2011    rt sided weakness  . Anemia     hx  . History of kidney stones   . Seizure disorder, secondary     2-3 focal siezures monthly  . Seizures   . Short-term memory loss     since stroke    PAST SURGICAL HISTORY: Past Surgical History  Procedure Laterality Date  . Laparoscopic hysterectomy    . Mandible fracture surgery    . Back surgery    . Cerebral angiogram      x3  . Stones      no surgery  . Radiology with anesthesia N/A 08/21/2013    Procedure: RADIOLOGY WITH ANESTHESIA;  Surgeon: Rob Hickman, MD;  Location: [facility];  Service: Radiology;  Laterality: N/A;    FAMILY HISTORY: Family History  Problem Relation Age of Onset  . Heart disease Father   . Heart attack  Father   . Lung cancer    . Heart disease Mother     SOCIAL HISTORY: History   Social History  . Marital Status: Married    Spouse Name: Jori Moll    Number of Children: 2  . Years of Education: 14   Occupational History  . Disabled-former accountant    Social History Main Topics  . Smoking status: Former Smoker  Quit date: 11/27/2009  . Smokeless tobacco: Never Used  . Alcohol Use: No     Comment: patient quit alcohol use 2005.   . Drug Use: No  . Sexual Activity: Not on file   Other Topics Concern  . Not on file   Social History Narrative   Patient lives at home with her husband Jori Moll).   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    Patient is on disability.    Patient has an Associates degree.   Patient is right-handed.   Patient drinks 2 cups of green tea most days.                 PHYSICAL EXAM  Filed Vitals:   09/19/13 1311  BP: 108/76  Pulse: 92  Height: 5\' 3"  (1.6 m)  Weight: 140 lb (63.504 kg)   Body mass index is 24.81 kg/(m^2).  Generalized: Well developed, in no acute distress  Head: normocephalic and atraumatic,. Oropharynx benign  Neck: Supple, no carotid bruits  Cardiac: Regular rate rhythm, no murmur    Neurological examination   Mentation: Alert oriented to time, place, history taking. Follows all commands speech and language fluent  Cranial nerve II-XII: Fundi not visualized.Pupils were equal round reactive to light extraocular movements were full, visual field were full on confrontational test. Facial is asymmetric with right lower face weakness . Hearing was intact to finger rubbing bilaterally. Uvula tongue midline. head turning and shoulder shrug were normal and symmetric.Tongue protrusion into cheek strength was normal. Motor: Mild 4/5 right-sided hemiparesis with distal more than proximal weakness. Increased tone on the right upper and lower extremity. Diminished fine finger movements on  the right . Mild right foot drop with AFO in place Sensory: Touch and pinprick are normal on the left decreased on the right, vibratory is decreased to the right ankle   Coordination: Impaired on the right Reflexes: Brachioradialis 2/2, biceps 2/2, triceps 2/2, patellar 2/2, Achilles 1/1, plantar responses were flexor bilaterally. Gait and Station: Rising up from seated position without assistance, wide based , right foot drop, using single-point cane, unsteady gait  DIAGNOSTIC DATA (LABS, IMAGING, TESTING) - I reviewed patient records, labs, notes, testing and imaging myself where available.  Lab Results  Component Value Date   WBC 5.7 08/22/2013   HGB 9.7* 08/22/2013   HCT 30.9* 08/22/2013   MCV 94.5 08/22/2013   PLT 161 08/22/2013      Component Value Date/Time   NA 143 08/22/2013 0515   K 3.7 08/22/2013 0515   CL 112 08/22/2013 0515   CO2 26 08/22/2013 0515   GLUCOSE 87 08/22/2013 0515   BUN 11 08/22/2013 0515   CREATININE 0.61 08/22/2013 0515   CALCIUM 8.2* 08/22/2013 0515   PROT 7.4 08/20/2013 1057   ALBUMIN 3.7 08/20/2013 1057   AST 34 08/20/2013 1057   ALT 24 08/20/2013 1057   ALKPHOS 90 08/20/2013 1057   BILITOT 0.5 08/20/2013 1057   GFRNONAA >90 08/22/2013 0515   GFRAA >90 08/22/2013 0515        ASSESSMENT AND PLAN  58 y.o. year old female  has a past medical history of left frontoparietal parenchymal hemorrhage March 2011 from PRES and malignant hypertension. Chronic headaches with history of migraines.Placement of pipeline stent across Left PICA aneurysm by Dr. Estanislado Pandy on 08/21/2013. She complains of spells which she describes as staring off for several seconds.This has lessened after stenting.    Continue  Topamax at current dose will renew Continue Keppra at current dose will renew Keep a record of your spells and call me in 2 weeks Followup in 4 months Dennie Bible, [facility], [facility], [facility] [address] [place], [place] [address] [phone]

## 2013-09-22 ENCOUNTER — Other Ambulatory Visit: Payer: Self-pay

## 2013-09-22 MED ORDER — TOPIRAMATE 25 MG PO TABS: ORAL_TABLET | ORAL | Status: DC

## 2013-10-04 ENCOUNTER — Ambulatory Visit (HOSPITAL_COMMUNITY)
Admission: RE | Admit: 2013-10-04 | Discharge: 2013-10-04 | Disposition: A | Discharge status: Home / Self Care | Payer: Medicare Other | Source: Ambulatory Visit | Attending: Radiology | Admitting: Radiology

## 2013-10-04 DIAGNOSIS — R51 Headache: Secondary | ICD-10-CM | POA: Insufficient documentation

## 2013-10-04 DIAGNOSIS — I671 Cerebral aneurysm, nonruptured: Secondary | ICD-10-CM | POA: Insufficient documentation

## 2013-10-04 DIAGNOSIS — L659 Nonscarring hair loss, unspecified: Secondary | ICD-10-CM | POA: Insufficient documentation

## 2013-10-04 DIAGNOSIS — R630 Anorexia: Secondary | ICD-10-CM | POA: Diagnosis not present

## 2013-10-04 DIAGNOSIS — R5381 Other malaise: Secondary | ICD-10-CM | POA: Insufficient documentation

## 2013-10-04 DIAGNOSIS — R0602 Shortness of breath: Secondary | ICD-10-CM | POA: Diagnosis not present

## 2013-10-04 DIAGNOSIS — R5383 Other fatigue: Secondary | ICD-10-CM | POA: Diagnosis not present

## 2013-10-04 DIAGNOSIS — Z48812 Encounter for surgical aftercare following surgery on the circulatory system: Secondary | ICD-10-CM | POA: Insufficient documentation

## 2013-10-04 LAB — CBC
HCT: 36.9 % (ref 36.0–46.0)
Hemoglobin: 11.9 g/dL — ABNORMAL LOW (ref 12.0–15.0)
MCH: 29.8 pg (ref 26.0–34.0)
MCHC: 32.2 g/dL (ref 30.0–36.0)
MCV: 92.3 fL (ref 78.0–100.0)
Platelets: 218 10*3/uL (ref 150–400)
RBC: 4 MIL/uL (ref 3.87–5.11)
RDW: 13.5 % (ref 11.5–15.5)
WBC: 5.5 10*3/uL (ref 4.0–10.5)

## 2013-10-04 LAB — PLATELET INHIBITION P2Y12: Platelet Function  P2Y12: 65 [PRU] — ABNORMAL LOW (ref 194–418)

## 2013-10-24 DIAGNOSIS — M26609 Unspecified temporomandibular joint disorder, unspecified side: Secondary | ICD-10-CM | POA: Diagnosis not present

## 2013-10-24 DIAGNOSIS — N390 Urinary tract infection, site not specified: Secondary | ICD-10-CM | POA: Diagnosis not present

## 2013-11-06 DIAGNOSIS — E039 Hypothyroidism, unspecified: Secondary | ICD-10-CM | POA: Diagnosis not present

## 2013-11-06 DIAGNOSIS — I119 Hypertensive heart disease without heart failure: Secondary | ICD-10-CM | POA: Diagnosis not present

## 2013-11-06 DIAGNOSIS — I517 Cardiomegaly: Secondary | ICD-10-CM | POA: Diagnosis not present

## 2013-11-06 DIAGNOSIS — R490 Dysphonia: Secondary | ICD-10-CM | POA: Diagnosis not present

## 2013-11-06 DIAGNOSIS — J3489 Other specified disorders of nose and nasal sinuses: Secondary | ICD-10-CM | POA: Diagnosis not present

## 2013-11-06 DIAGNOSIS — R7309 Other abnormal glucose: Secondary | ICD-10-CM | POA: Diagnosis not present

## 2013-11-19 ENCOUNTER — Other Ambulatory Visit (HOSPITAL_COMMUNITY): Payer: Self-pay | Admitting: Interventional Radiology

## 2013-11-19 ENCOUNTER — Telehealth (HOSPITAL_COMMUNITY): Payer: Self-pay | Admitting: Interventional Radiology

## 2013-11-19 DIAGNOSIS — I729 Aneurysm of unspecified site: Secondary | ICD-10-CM

## 2013-11-19 NOTE — Telephone Encounter (Signed)
Called pt, spoke to her husband. He said he would have her call me back to schedule her 6 month f/u CTA JM

## 2013-11-21 ENCOUNTER — Ambulatory Visit (HOSPITAL_COMMUNITY)
Admission: RE | Admit: 2013-11-21 | Discharge: 2013-11-21 | Disposition: A | Discharge status: Home / Self Care | Payer: Medicare Other | Source: Ambulatory Visit | Attending: Interventional Radiology | Admitting: Interventional Radiology

## 2013-11-21 DIAGNOSIS — G9389 Other specified disorders of brain: Secondary | ICD-10-CM | POA: Diagnosis not present

## 2013-11-21 DIAGNOSIS — I729 Aneurysm of unspecified site: Secondary | ICD-10-CM

## 2013-11-21 DIAGNOSIS — I671 Cerebral aneurysm, nonruptured: Secondary | ICD-10-CM | POA: Diagnosis not present

## 2013-11-21 MED ORDER — IOHEXOL 350 MG/ML SOLN
50.0000 mL | Freq: Once | INTRAVENOUS | Status: AC | PRN
  Administered 2013-11-21: 50 mL via INTRAVENOUS

## 2013-12-02 DIAGNOSIS — R490 Dysphonia: Secondary | ICD-10-CM | POA: Diagnosis not present

## 2013-12-02 DIAGNOSIS — J383 Other diseases of vocal cords: Secondary | ICD-10-CM | POA: Diagnosis not present

## 2013-12-02 DIAGNOSIS — H9209 Otalgia, unspecified ear: Secondary | ICD-10-CM | POA: Diagnosis not present

## 2013-12-04 ENCOUNTER — Encounter (HOSPITAL_COMMUNITY): Payer: Self-pay | Admitting: Emergency Medicine

## 2013-12-04 ENCOUNTER — Emergency Department (INDEPENDENT_AMBULATORY_CARE_PROVIDER_SITE_OTHER)
Admission: EM | Admit: 2013-12-04 | Discharge: 2013-12-04 | Disposition: A | Discharge status: Home / Self Care | Payer: Medicare Other | Source: Home / Self Care | Attending: Family Medicine | Admitting: Family Medicine

## 2013-12-04 DIAGNOSIS — A084 Viral intestinal infection, unspecified: Secondary | ICD-10-CM

## 2013-12-04 DIAGNOSIS — I951 Orthostatic hypotension: Secondary | ICD-10-CM | POA: Diagnosis not present

## 2013-12-04 DIAGNOSIS — A088 Other specified intestinal infections: Secondary | ICD-10-CM | POA: Diagnosis not present

## 2013-12-04 LAB — COMPREHENSIVE METABOLIC PANEL
ALT: 30 U/L (ref 0–35)
AST: 43 U/L — ABNORMAL HIGH (ref 0–37)
Albumin: 3.9 g/dL (ref 3.5–5.2)
Alkaline Phosphatase: 101 U/L (ref 39–117)
BUN: 30 mg/dL — ABNORMAL HIGH (ref 6–23)
CO2: 19 mEq/L (ref 19–32)
Calcium: 9.5 mg/dL (ref 8.4–10.5)
Chloride: 100 mEq/L (ref 96–112)
Creatinine, Ser: 0.8 mg/dL (ref 0.50–1.10)
GFR calc Af Amer: >90 mL/min (ref >90)
GFR calc non Af Amer: 80 mL/min — ABNORMAL LOW (ref >90)
Glucose, Bld: 119 mg/dL — ABNORMAL HIGH (ref 70–99)
Potassium: 3.7 mEq/L (ref 3.7–5.3)
Sodium: 138 mEq/L (ref 137–147)
Total Bilirubin: 0.4 mg/dL (ref 0.3–1.2)
Total Protein: 8 g/dL (ref 6.0–8.3)

## 2013-12-04 LAB — CBC
HCT: 40.1 % (ref 36.0–46.0)
Hemoglobin: 13.5 g/dL (ref 12.0–15.0)
MCH: 30.3 pg (ref 26.0–34.0)
MCHC: 33.7 g/dL (ref 30.0–36.0)
MCV: 89.9 fL (ref 78.0–100.0)
Platelets: 201 10*3/uL (ref 150–400)
RBC: 4.46 MIL/uL (ref 3.87–5.11)
RDW: 14.1 % (ref 11.5–15.5)
WBC: 8.8 10*3/uL (ref 4.0–10.5)

## 2013-12-04 MED ORDER — ONDANSETRON HCL 4 MG/2ML IJ SOLN
4.0000 mg | Freq: Once | INTRAMUSCULAR | Status: AC
  Administered 2013-12-04: 4 mg via INTRAVENOUS

## 2013-12-04 MED ORDER — ONDANSETRON HCL 8 MG PO TABS: 8.0000 mg | ORAL_TABLET | Freq: Three times a day (TID) | ORAL | Status: DC | PRN

## 2013-12-04 MED ORDER — ONDANSETRON HCL 4 MG/2ML IJ SOLN
INTRAMUSCULAR | Status: AC
  Filled 2013-12-04: qty 2

## 2013-12-04 MED ORDER — SODIUM CHLORIDE 0.9 % IV BOLUS (SEPSIS)
1000.0000 mL | Freq: Once | INTRAVENOUS | Status: AC
  Administered 2013-12-04: 1000 mL via INTRAVENOUS

## 2013-12-04 NOTE — Discharge Instructions (Signed)
Thank you for coming in today. Call or go to the emergency room if you get worse, have trouble breathing, have chest pains, or palpitations.  Take small amounts of fluids and food frequently. Return as needed If your belly pain worsens, or you have high fever, bad vomiting, blood in your stool or black tarry stool go to the Emergency Room.    Viral Gastroenteritis Viral gastroenteritis is also known as stomach flu. This condition affects the stomach and intestinal tract. It can cause sudden diarrhea and vomiting. The illness typically lasts 3 to 8 days. Most people develop an immune response that eventually gets rid of the virus. While this natural response develops, the virus can make you quite ill. CAUSES  Many different viruses can cause gastroenteritis, such as rotavirus or noroviruses. You can catch one of these viruses by consuming contaminated food or water. You may also catch a virus by sharing utensils or other personal items with an infected person or by touching a contaminated surface. SYMPTOMS  The most common symptoms are diarrhea and vomiting. These problems can cause a severe loss of body fluids (dehydration) and a body salt (electrolyte) imbalance. Other symptoms may include:  Fever.  Headache.  Fatigue.  Abdominal pain. DIAGNOSIS  Your caregiver can usually diagnose viral gastroenteritis based on your symptoms and a physical exam. A stool sample may also be taken to test for the presence of viruses or other infections. TREATMENT  This illness typically goes away on its own. Treatments are aimed at rehydration. The most serious cases of viral gastroenteritis involve vomiting so severely that you are not able to keep fluids down. In these cases, fluids must be given through an intravenous line (IV). HOME CARE INSTRUCTIONS   Drink enough fluids to keep your urine clear or pale yellow. Drink small amounts of fluids frequently and increase the amounts as tolerated.  Ask your  caregiver for specific rehydration instructions.  Avoid:  Foods high in sugar.  Alcohol.  Carbonated drinks.  Tobacco.  Juice.  Caffeine drinks.  Extremely hot or cold fluids.  Fatty, greasy foods.  Too much intake of anything at one time.  Dairy products until 24 to 48 hours after diarrhea stops.  You may consume probiotics. Probiotics are active cultures of beneficial bacteria. They may lessen the amount and number of diarrheal stools in adults. Probiotics can be found in yogurt with active cultures and in supplements.  Wash your hands well to avoid spreading the virus.  Only take over-the-counter or prescription medicines for pain, discomfort, or fever as directed by your caregiver. Do not give aspirin to children. Antidiarrheal medicines are not recommended.  Ask your caregiver if you should continue to take your regular prescribed and over-the-counter medicines.  Keep all follow-up appointments as directed by your caregiver. SEEK IMMEDIATE MEDICAL CARE IF:   You are unable to keep fluids down.  You do not urinate at least once every 6 to 8 hours.  You develop shortness of breath.  You notice blood in your stool or vomit. This may look like coffee grounds.  You have abdominal pain that increases or is concentrated in one small area (localized).  You have persistent vomiting or diarrhea.  You have a fever.  The patient is a child younger than 3 months, and he or she has a fever.  The patient is a child older than 3 months, and he or she has a fever and persistent symptoms.  The patient is a child older than 3  months, and he or she has a fever and symptoms suddenly get worse.  The patient is a baby, and he or she has no tears when crying. MAKE SURE YOU:   Understand these instructions.  Will watch your condition.  Will get help right away if you are not doing well or get worse. Document Released: 08/22/2005 Document Revised: 11/14/2011 Document  Reviewed: 06/08/2011 [facility] Patient Information 2014 [place].

## 2013-12-04 NOTE — ED Provider Notes (Signed)
[name] is a 58 y.o. female who presents to Urgent Care today for nausea vomiting and diarrhea associated with mild abdominal cramping. This is been present for the last 2 days. She's been unable to keep her hypertension medications and her antiepileptic medications down. She feels weak with standing position he fainted. She denies any syncope or vertigo. She denies any blood in her vomit or stool. She does note a mild fever.   Past Medical History  Diagnosis Date  . Asthma   . COPD (chronic obstructive pulmonary disease)      - PFTs  01/24/06 FEV1 69% ratio 68% diffusing capacity 57% with no improvement after B2   - PFT's 10/1/ 09 FEV1 77   ratio  76    dlco                   45            no resp to B2    - Nl alpha one antitripsin level 10/09  . Hyperlipidemia   . Aneurysm   . Depression   . Anxiety   . Headache(784.0)   . Hypertension   . Hypothyroidism   . Pneumonia     hx  . Stroke 2011    rt sided weakness  . Anemia     hx  . History of kidney stones   . Seizure disorder, secondary     2-3 focal siezures monthly  . Seizures   . Short-term memory loss     since stroke   History  Substance Use Topics  . Smoking status: Former Smoker    Quit date: 11/27/2009  . Smokeless tobacco: Never Used  . Alcohol Use: No     Comment: patient quit alcohol use 2005.    ROS as above Medications: No current facility-administered medications for this encounter.   Current Outpatient Prescriptions  Medication Sig Dispense Refill  . ALPRAZolam (XANAX) 0.25 MG tablet Take 0.25 mg by mouth every 6 (six) hours as needed for anxiety or sleep.       Marland Kitchen aspirin 325 MG tablet Take 325 mg by mouth daily.      . B Complex-C (SUPER B COMPLEX PO) Take 1 tablet by mouth daily.      . Biotin 5000 MCG TABS Take 1 tablet by mouth 2 (two) times daily.       . Calcium Carb-Cholecalciferol (CALCIUM 500 +D) 500-400 MG-UNIT TABS Take 1 tablet by mouth daily.      . Cholecalciferol (VITAMIN D-3  PO) Take 1,000 Units by mouth 2 (two) times daily.       . citalopram (CELEXA) 40 MG tablet Take 40 mg by mouth at bedtime.      . clindamycin (CLEOCIN T) 1 % external solution Apply 1 application topically 2 (two) times daily. Apply to face      . clopidogrel (PLAVIX) 75 MG tablet Take 75 mg by mouth daily with breakfast.      . Cranberry-Cholecalciferol (SUPER CRANBERRY/VITAMIN D3 PO) Take 1 tablet by mouth 2 (two) times daily.       . cyclobenzaprine (FLEXERIL) 10 MG tablet Take 10 mg by mouth at bedtime.       . diclofenac sodium (VOLTAREN) 1 % GEL Apply 2 g topically 3 (three) times daily as needed. For pain      . famotidine (PEPCID) 20 MG tablet Take 20 mg by mouth 2 (two) times daily.       Marland Kitchen levETIRAcetam (  KEPPRA) 500 MG tablet Take 1 tablet (500 mg total) by mouth every 12 (twelve) hours.  60 tablet  6  . levothyroxine (SYNTHROID, LEVOTHROID) 50 MCG tablet Take 50 mcg by mouth daily before breakfast.       . losartan (COZAAR) 100 MG tablet Take 100 mg by mouth every morning.       . rosuvastatin (CRESTOR) 10 MG tablet Take 10 mg by mouth at bedtime.       . topiramate (TOPAMAX) 25 MG tablet Take 25mg  in the morning and 50mg  in the evening  90 tablet  6  . vitamin B-12 (CYANOCOBALAMIN) 1000 MCG tablet Take 1,000 mcg by mouth daily.        Exam:  BP 105/69  Pulse 108  Temp(Src) 101.9 F (38.8 C) (Oral)  Resp 20  SpO2 99% Filed Vitals:   12/04/13 1733 12/04/13 1843 12/04/13 1845  BP: 118/66 114/75 105/69  Pulse: 125 109 108  Temp: 101.9 F (38.8 C)    TempSrc: Oral    Resp: 20    SpO2: 99%      Gen: Well NAD nontoxic appearing HEENT: EOMI,  MMM Lungs: Normal work of breathing. CTABL Heart: Tachycardia but regular no MRG Abd: NABS, Soft. NT, ND Exts: Brisk capillary refill, warm and well perfused.   Patient was given 1 L normal saline bolus, and 4 mg of IV Zofran and felt much better. She was able to tolerate food and drink prior to discharge.  Results for orders  placed during the hospital encounter of 12/04/13 (from the past 24 hour(s))  CBC     Status: None   Collection Time    12/04/13  6:01 PM      Result Value Ref Range   WBC 8.8  4.0 - 10.5 K/uL   RBC 4.46  3.87 - 5.11 MIL/uL   Hemoglobin 13.5  12.0 - 15.0 g/dL   HCT 40.1  36.0 - 46.0 %   MCV 89.9  78.0 - 100.0 fL   MCH 30.3  26.0 - 34.0 pg   MCHC 33.7  30.0 - 36.0 g/dL   RDW 14.1  11.5 - 15.5 %   Platelets 201  150 - 400 K/uL  COMPREHENSIVE METABOLIC PANEL     Status: Abnormal   Collection Time    12/04/13  6:01 PM      Result Value Ref Range   Sodium 138  137 - 147 mEq/L   Potassium 3.7  3.7 - 5.3 mEq/L   Chloride 100  96 - 112 mEq/L   CO2 19  19 - 32 mEq/L   Glucose, Bld 119 (*) 70 - 99 mg/dL   BUN 30 (*) 6 - 23 mg/dL   Creatinine, Ser 0.80  0.50 - 1.10 mg/dL   Calcium 9.5  8.4 - 10.5 mg/dL   Total Protein 8.0  6.0 - 8.3 g/dL   Albumin 3.9  3.5 - 5.2 g/dL   AST 43 (*) 0 - 37 U/L   ALT 30  0 - 35 U/L   Alkaline Phosphatase 101  39 - 117 U/L   Total Bilirubin 0.4  0.3 - 1.2 mg/dL   GFR calc non Af Amer 80 (*) >90 mL/min   GFR calc Af Amer >90  >90 mL/min   No results found.  Assessment and Plan: 58 y.o. female with viral gastroenteritis with orthostatic hypotension. Patient improved dramatically with IV fluids, and IV Zofran. Discharge with prescription for oral Zofran and watchful waiting. Continue Tylenol  or ibuprofen. Resume antiseizure medications at home tonight. Followup with primary care provider soon.  Discussed warning signs or symptoms. Please see discharge instructions. Patient expresses understanding.    Gregor Hams, MD 12/04/13 [phone]

## 2013-12-04 NOTE — ED Notes (Signed)
Pt c/o cold sxs onset Monday Sxs include f/v/n/d, abd pain Dr. Georgina Snell is w/the pt Alert w/no signs of acute distress.

## 2013-12-06 ENCOUNTER — Telehealth (HOSPITAL_COMMUNITY): Payer: Self-pay | Admitting: Interventional Radiology

## 2013-12-06 NOTE — Telephone Encounter (Signed)
Called pt, told her that Deveshwar wanted to do a cath angio in 3 months time. Pt states understanding and is in agreement with this plan of care. JM

## 2013-12-19 ENCOUNTER — Telehealth: Payer: Self-pay | Admitting: Radiology

## 2013-12-19 NOTE — Progress Notes (Signed)
I have called a RX refill of plavix 75mg  daily to [facility] [address] [facility] [phone] with (3) refills.  Tsosie Billing PA-C Interventional Radiology  12/19/2013 1:45 PM

## 2014-01-13 ENCOUNTER — Encounter (INDEPENDENT_AMBULATORY_CARE_PROVIDER_SITE_OTHER): Payer: Self-pay

## 2014-01-13 ENCOUNTER — Ambulatory Visit (INDEPENDENT_AMBULATORY_CARE_PROVIDER_SITE_OTHER): Payer: Medicare Other | Admitting: Interventional Cardiology

## 2014-01-13 ENCOUNTER — Encounter: Payer: Self-pay | Admitting: Interventional Cardiology

## 2014-01-13 VITALS — BP 133/81 | HR 72 | Ht 62.5 in | Wt 135.8 lb

## 2014-01-13 DIAGNOSIS — I10 Essential (primary) hypertension: Secondary | ICD-10-CM

## 2014-01-13 DIAGNOSIS — I517 Cardiomegaly: Secondary | ICD-10-CM

## 2014-01-13 DIAGNOSIS — R0609 Other forms of dyspnea: Secondary | ICD-10-CM | POA: Diagnosis not present

## 2014-01-13 DIAGNOSIS — E785 Hyperlipidemia, unspecified: Secondary | ICD-10-CM

## 2014-01-13 DIAGNOSIS — J449 Chronic obstructive pulmonary disease, unspecified: Secondary | ICD-10-CM | POA: Diagnosis not present

## 2014-01-13 DIAGNOSIS — R0600 Dyspnea, unspecified: Secondary | ICD-10-CM

## 2014-01-13 DIAGNOSIS — R0989 Other specified symptoms and signs involving the circulatory and respiratory systems: Secondary | ICD-10-CM | POA: Diagnosis not present

## 2014-01-13 DIAGNOSIS — I671 Cerebral aneurysm, nonruptured: Secondary | ICD-10-CM

## 2014-01-13 LAB — BRAIN NATRIURETIC PEPTIDE: Pro B Natriuretic peptide (BNP): 23 pg/mL (ref 0.0–100.0)

## 2014-01-13 NOTE — Progress Notes (Signed)
Patient ID: [name], female   DOB: 1956-07-07, 58 y.o.   MRN: [id number]   Date: 01/13/2014 ID: [name], DOB 16-Jul-1956, MRN [id number] PCP: [name], MD  Reason: Cardiomegaly and dyspnea  ASSESSMENT;  1. Dyspnea on exertion 2. Cardiomegaly 3. Hypertension 4. 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 COPD 6. Possible right thyroid nodule  PLAN:  1. 2-D Doppler echocardiogram to assess the diagnosis of cardiomegaly. Ruling out hypertensive heart disease versus cor pulmonale with RV involvement 2. BNP 3. Further cardiac evaluation will be pending results of above testing 4. Consider thyroid ultrasound   SUBJECTIVE: [name] is a 58 y.o. female who is referred by Dr. Baird Cancer for evaluation of a history of exertional dyspnea. In reviewing the records there is also mention of cardiomegaly. The patient denies a history of heart disease. She does have "indigestion". This is relieved by antacids. There is no orthopnea, PND, edema, or other heart failure complaints. She denies palpitations and syncope. She has had significant neurological difficulties including a ruptured brain aneurysm with intracerebral hemorrhage in the past. She also tells me that Dr. Patrecia Pour has implanted a intracranial stent.   Allergies  Allergen Reactions  . Phenytoin Hives and Swelling    Current Outpatient Prescriptions on File Prior to Visit  Medication Sig Dispense Refill  . ALPRAZolam (XANAX) 0.25 MG tablet Take 0.25 mg by mouth 2 (two) times daily as needed for anxiety or sleep.       Marland Kitchen aspirin 325 MG tablet Take 325 mg by mouth daily.      . B Complex-C (SUPER B COMPLEX PO) Take 1 tablet by mouth daily.      . Biotin 5000 MCG TABS Take 1 tablet by mouth 2 (two) times daily.       . Calcium Carb-Cholecalciferol (CALCIUM 500 +D) 500-400 MG-UNIT TABS Take 1 tablet by mouth daily.      . Cholecalciferol (VITAMIN D-3 PO) Take 1,000 Units by mouth 2  (two) times daily.       . citalopram (CELEXA) 40 MG tablet Take 40 mg by mouth at bedtime.      . clindamycin (CLEOCIN T) 1 % external solution Apply 1 application topically 2 (two) times daily. Apply to face      . clopidogrel (PLAVIX) 75 MG tablet Take 75 mg by mouth daily with breakfast.      . Cranberry-Cholecalciferol (SUPER CRANBERRY/VITAMIN D3 PO) Take 1 tablet by mouth 2 (two) times daily.       . cyclobenzaprine (FLEXERIL) 10 MG tablet Take 10 mg by mouth at bedtime.       . diclofenac sodium (VOLTAREN) 1 % GEL Apply 2 g topically 3 (three) times daily as needed. For pain      . famotidine (PEPCID) 20 MG tablet Take 20 mg by mouth 2 (two) times daily.       Marland Kitchen levETIRAcetam (KEPPRA) 500 MG tablet Take 1 tablet (500 mg total) by mouth every 12 (twelve) hours.  60 tablet  6  . levothyroxine (SYNTHROID, LEVOTHROID) 50 MCG tablet Take 50 mcg by mouth daily before breakfast.       . losartan (COZAAR) 100 MG tablet Take 100 mg by mouth every morning.       . rosuvastatin (CRESTOR) 10 MG tablet Take 10 mg by mouth at bedtime.       . topiramate (TOPAMAX) 25 MG tablet Take 25mg  in the morning and 50mg  in the evening  90 tablet  6   No current facility-administered medications on file prior to visit.    Past Medical History  Diagnosis Date  . Asthma   . COPD (chronic obstructive pulmonary disease)      - PFTs  01/24/06 FEV1 69% ratio 68% diffusing capacity 57% with no improvement after B2   - PFT's 10/1/ 09 FEV1 77   ratio  76    dlco                   45            no resp to B2    - Nl alpha one antitripsin level 10/09  . Hyperlipidemia   . Aneurysm   . Depression   . Anxiety   . Headache(784.0)   . Hypertension   . Hypothyroidism   . Pneumonia     hx  . Stroke 2011    rt sided weakness  . Anemia     hx  . History of kidney stones   . Seizure disorder, secondary     2-3 focal siezures monthly  . Seizures   . Short-term memory loss     since stroke  . TMJ (temporomandibular  joint disorder)   . Increased urinary frequency   . Hammer toe   . Benign hypertensive heart disease   . Hemiparesis affecting dominant side as late effect of cerebrovascular accident   . Pure hypercholesterolemia   . Obesity   . Vitamin D deficiency   . Recurrent major depressive episodes   . Chest pain   . Difficulty speaking   . Malaise and fatigue   . Atrophic vaginitis   . Enthesopathy of elbow region   . GERD (gastroesophageal reflux disease)   . Cardiomegaly   . Urinary tract infectious disease   . Disorder of nasal cavity   . Abnormal gait   . Amnesia   . Low compliance bladder   . Dyspnea   . Vertigo as late effect of stroke   . Skin sensation disturbance   . Nonruptured cerebral aneurysm   . Cough   . Swelling of limb     Past Surgical History  Procedure Laterality Date  . Laparoscopic hysterectomy    . Mandible fracture surgery    . Back surgery    . Cerebral angiogram      x3  . Stones      no surgery  . Radiology with anesthesia N/A 08/21/2013    Procedure: RADIOLOGY WITH ANESTHESIA;  Surgeon: Rob Hickman, MD;  Location: [facility];  Service: Radiology;  Laterality: N/A;    History   Social History  . Marital Status: Married    Spouse Name: Jori Moll    Number of Children: 2  . Years of Education: 14   Occupational History  . Disabled-former accountant    Social History Main Topics  . Smoking status: Former Smoker    Quit date: 09/05/2008  . Smokeless tobacco: Never Used  . Alcohol Use: No     Comment: patient quit alcohol use 2005.   . Drug Use: No  . Sexual Activity: Not on file   Other Topics Concern  . Not on file   Social History Narrative   Patient lives at home with her husband Jori Moll).   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    Patient is on disability.    Patient has an Associates degree.  Patient is right-handed.   Patient drinks 2 cups of green tea most days.                 Family History  Problem Relation Age of Onset  . Heart disease Father   . Heart attack Father   . Lung cancer    . Heart disease Mother     ROS: Decreased memory. Right-sided weakness. Occasional difficulty with word finding. Frequent falls. Denies chest pain. No PND P. denies peripheral edema. No palpitations. Long-standing history of smoking and hypertension. Medication compliance is applicator.. Other systems negative for complaints.  OBJECTIVE: BP 133/81  Pulse 72  Ht 5' 2.5" (1.588 m)  Wt 135 lb 12.8 oz (61.598 kg)  BMI 24.43 kg/m2,  General: No acute distress, appears consistent with her stated age. There is right facial droop. HEENT: normal with no evidence of jaundice or pallor Neck: JVD flat with the patient lying at 45. Carotids no bruits. A symmetrical neck with possible thyroid nodule in the right lower neck. Chest: Clear Cardiac: Murmur: Abscess. Gallop: S4. Rhythm: Absent. Other: None Abdomen: Bruit: Absent. Pulsation: Absent Extremities: Edema: Absent. Pulses: 2+ and symmetric Neuro: Right-sided weakness, right facial droop, expressive language difficulty (mild) Psych: Appears normal  ECG: Normal sinus rhythm with prominent inferior lead to P-wave amplitude.

## 2014-01-13 NOTE — Patient Instructions (Signed)
Your physician recommends that you continue on your current medications as directed. Please refer to the Current Medication list given to you today.  Lab Today: Bnp  Your physician has requested that you have an echocardiogram. Echocardiography is a painless test that uses sound waves to create images of your heart. It provides your doctor with information about the size and shape of your heart and how well your heart's chambers and valves are working. This procedure takes approximately one hour. There are no restrictions for this procedure.   Your physician recommends that you schedule a follow-up appointment pending test results

## 2014-01-16 ENCOUNTER — Telehealth: Payer: Self-pay

## 2014-01-16 NOTE — Telephone Encounter (Signed)
pt aware of lab results.No evidence that fluid buildup is causing SOB.pt verbalized understanding.

## 2014-01-16 NOTE — Telephone Encounter (Signed)
Message copied by Lamar Laundry on Thu Jan 16, 2014  9:02 AM ------      Message from: Daneen Schick      Created: Wed Jan 15, 2014  8:12 AM       No evidence that fluid buildup is causing SOB ------

## 2014-01-17 ENCOUNTER — Ambulatory Visit: Payer: Medicare Other | Admitting: Nurse Practitioner

## 2014-01-30 ENCOUNTER — Ambulatory Visit (HOSPITAL_COMMUNITY): Payer: Medicare Other | Attending: Interventional Cardiology | Admitting: Radiology

## 2014-01-30 DIAGNOSIS — R0602 Shortness of breath: Secondary | ICD-10-CM

## 2014-01-30 DIAGNOSIS — I517 Cardiomegaly: Secondary | ICD-10-CM | POA: Diagnosis not present

## 2014-01-30 NOTE — Progress Notes (Signed)
Echocardiogram performed.  

## 2014-02-06 ENCOUNTER — Telehealth: Payer: Self-pay

## 2014-02-06 DIAGNOSIS — R1901 Right upper quadrant abdominal swelling, mass and lump: Secondary | ICD-10-CM

## 2014-02-06 DIAGNOSIS — R931 Abnormal findings on diagnostic imaging of heart and coronary circulation: Secondary | ICD-10-CM

## 2014-02-06 NOTE — Telephone Encounter (Signed)
Message copied by Lamar Laundry on Thu Feb 06, 2014  3:41 PM ------      Message from: Daneen Schick      Created: Wed Feb 05, 2014  5:43 PM       Abnormal study suggsting a mass in upper abdomen.      Chest and abdominal CT with iv contrast to evaluate possible mass compressing the RV ------

## 2014-02-06 NOTE — Telephone Encounter (Signed)
pt aware of echo results and Dr.Smith's recommendations.Abnormal study suggsting a mass in upper abdomen. Chest and abdominal CT with iv contrast to evaluate possible mass compressing the RV.pt will come in for bmet tomorrow,adv pt scheduling  will call her to CTs.pt verbalized understanding.

## 2014-02-07 ENCOUNTER — Other Ambulatory Visit (INDEPENDENT_AMBULATORY_CARE_PROVIDER_SITE_OTHER): Payer: Medicare Other

## 2014-02-07 DIAGNOSIS — R9389 Abnormal findings on diagnostic imaging of other specified body structures: Secondary | ICD-10-CM

## 2014-02-07 DIAGNOSIS — R931 Abnormal findings on diagnostic imaging of heart and coronary circulation: Secondary | ICD-10-CM

## 2014-02-07 LAB — BASIC METABOLIC PANEL
BUN: 18 mg/dL (ref 6–23)
CO2: 26 mEq/L (ref 19–32)
Calcium: 9.1 mg/dL (ref 8.4–10.5)
Chloride: 108 mEq/L (ref 96–112)
Creatinine, Ser: 0.6 mg/dL (ref 0.4–1.2)
GFR: 107.35 mL/min (ref >60.00)
Glucose, Bld: 75 mg/dL (ref 70–99)
Potassium: 4 mEq/L (ref 3.5–5.1)
Sodium: 140 mEq/L (ref 135–145)

## 2014-02-12 ENCOUNTER — Telehealth: Payer: Self-pay

## 2014-02-12 NOTE — Telephone Encounter (Signed)
pt given lab results.  Normal.pt verbalized understanding. 

## 2014-02-12 NOTE — Telephone Encounter (Signed)
Message copied by Lamar Laundry on Wed Feb 12, 2014 11:05 AM ------      Message from: Daneen Schick      Created: Mon Feb 10, 2014  2:16 PM       Normal ------

## 2014-02-14 ENCOUNTER — Ambulatory Visit (INDEPENDENT_AMBULATORY_CARE_PROVIDER_SITE_OTHER)
Admission: RE | Admit: 2014-02-14 | Discharge: 2014-02-14 | Disposition: A | Discharge status: Home / Self Care | Payer: Medicare Other | Source: Ambulatory Visit | Attending: Interventional Cardiology | Admitting: Interventional Cardiology

## 2014-02-14 DIAGNOSIS — J438 Other emphysema: Secondary | ICD-10-CM | POA: Diagnosis not present

## 2014-02-14 DIAGNOSIS — R9389 Abnormal findings on diagnostic imaging of other specified body structures: Secondary | ICD-10-CM

## 2014-02-14 DIAGNOSIS — R931 Abnormal findings on diagnostic imaging of heart and coronary circulation: Secondary | ICD-10-CM

## 2014-02-14 DIAGNOSIS — I700 Atherosclerosis of aorta: Secondary | ICD-10-CM | POA: Diagnosis not present

## 2014-02-14 DIAGNOSIS — R1901 Right upper quadrant abdominal swelling, mass and lump: Secondary | ICD-10-CM

## 2014-02-14 MED ORDER — IOHEXOL 350 MG/ML SOLN
100.0000 mL | Freq: Once | INTRAVENOUS | Status: AC | PRN
  Administered 2014-02-14: 100 mL via INTRAVENOUS

## 2014-02-19 ENCOUNTER — Telehealth: Payer: Self-pay

## 2014-02-19 NOTE — Telephone Encounter (Signed)
pt aware of abn and chest ct.Results are normal. Unsure what the echo reader was referring to but no problem exists. No further w/u needed. Heart lung, abdomen all normal.pt verbalized understanding.

## 2014-02-19 NOTE — Telephone Encounter (Signed)
Message copied by Lamar Laundry on Wed Feb 19, 2014  3:56 PM ------      Message from: Daneen Schick      Created: Wed Feb 19, 2014  3:16 PM       Results are norma. Unsure what the echo reader was referring to but no problem exists. No further w/u needed. Heart lung, abdomen all normal. ------

## 2014-04-08 ENCOUNTER — Other Ambulatory Visit (HOSPITAL_COMMUNITY): Payer: Self-pay | Admitting: Interventional Radiology

## 2014-04-08 ENCOUNTER — Other Ambulatory Visit: Payer: Self-pay | Admitting: Radiology

## 2014-04-08 DIAGNOSIS — I729 Aneurysm of unspecified site: Secondary | ICD-10-CM

## 2014-04-10 ENCOUNTER — Encounter (HOSPITAL_COMMUNITY): Payer: Self-pay | Admitting: Pharmacy Technician

## 2014-04-11 ENCOUNTER — Encounter (HOSPITAL_COMMUNITY): Payer: Self-pay

## 2014-04-11 ENCOUNTER — Ambulatory Visit (HOSPITAL_COMMUNITY)
Admission: RE | Admit: 2014-04-11 | Discharge: 2014-04-11 | Disposition: A | Discharge status: Home / Self Care | Payer: Medicare Other | Source: Ambulatory Visit | Attending: Interventional Radiology | Admitting: Interventional Radiology

## 2014-04-11 ENCOUNTER — Other Ambulatory Visit (HOSPITAL_COMMUNITY): Payer: Self-pay | Admitting: Interventional Radiology

## 2014-04-11 DIAGNOSIS — E669 Obesity, unspecified: Secondary | ICD-10-CM | POA: Insufficient documentation

## 2014-04-11 DIAGNOSIS — F411 Generalized anxiety disorder: Secondary | ICD-10-CM | POA: Diagnosis not present

## 2014-04-11 DIAGNOSIS — J449 Chronic obstructive pulmonary disease, unspecified: Secondary | ICD-10-CM | POA: Insufficient documentation

## 2014-04-11 DIAGNOSIS — F339 Major depressive disorder, recurrent, unspecified: Secondary | ICD-10-CM | POA: Insufficient documentation

## 2014-04-11 DIAGNOSIS — K219 Gastro-esophageal reflux disease without esophagitis: Secondary | ICD-10-CM | POA: Insufficient documentation

## 2014-04-11 DIAGNOSIS — Z7902 Long term (current) use of antithrombotics/antiplatelets: Secondary | ICD-10-CM | POA: Diagnosis not present

## 2014-04-11 DIAGNOSIS — Z48812 Encounter for surgical aftercare following surgery on the circulatory system: Secondary | ICD-10-CM | POA: Diagnosis not present

## 2014-04-11 DIAGNOSIS — Z7982 Long term (current) use of aspirin: Secondary | ICD-10-CM | POA: Diagnosis not present

## 2014-04-11 DIAGNOSIS — I119 Hypertensive heart disease without heart failure: Secondary | ICD-10-CM | POA: Insufficient documentation

## 2014-04-11 DIAGNOSIS — Z791 Long term (current) use of non-steroidal anti-inflammatories (NSAID): Secondary | ICD-10-CM | POA: Insufficient documentation

## 2014-04-11 DIAGNOSIS — IMO0002 Reserved for concepts with insufficient information to code with codable children: Secondary | ICD-10-CM | POA: Insufficient documentation

## 2014-04-11 DIAGNOSIS — Z87891 Personal history of nicotine dependence: Secondary | ICD-10-CM | POA: Diagnosis not present

## 2014-04-11 DIAGNOSIS — J4489 Other specified chronic obstructive pulmonary disease: Secondary | ICD-10-CM | POA: Diagnosis not present

## 2014-04-11 DIAGNOSIS — I69959 Hemiplegia and hemiparesis following unspecified cerebrovascular disease affecting unspecified side: Secondary | ICD-10-CM | POA: Insufficient documentation

## 2014-04-11 DIAGNOSIS — R51 Headache: Secondary | ICD-10-CM | POA: Insufficient documentation

## 2014-04-11 DIAGNOSIS — G40909 Epilepsy, unspecified, not intractable, without status epilepticus: Secondary | ICD-10-CM | POA: Insufficient documentation

## 2014-04-11 DIAGNOSIS — E785 Hyperlipidemia, unspecified: Secondary | ICD-10-CM | POA: Diagnosis not present

## 2014-04-11 DIAGNOSIS — I729 Aneurysm of unspecified site: Secondary | ICD-10-CM

## 2014-04-11 DIAGNOSIS — I671 Cerebral aneurysm, nonruptured: Secondary | ICD-10-CM | POA: Diagnosis not present

## 2014-04-11 DIAGNOSIS — M7989 Other specified soft tissue disorders: Secondary | ICD-10-CM | POA: Insufficient documentation

## 2014-04-11 LAB — BASIC METABOLIC PANEL
Anion gap: 13 (ref 5–15)
BUN: 18 mg/dL (ref 6–23)
CO2: 22 mEq/L (ref 19–32)
Calcium: 9.2 mg/dL (ref 8.4–10.5)
Chloride: 109 mEq/L (ref 96–112)
Creatinine, Ser: 0.7 mg/dL (ref 0.50–1.10)
GFR calc Af Amer: >90 mL/min (ref >90)
GFR calc non Af Amer: >90 mL/min (ref >90)
Glucose, Bld: 65 mg/dL — ABNORMAL LOW (ref 70–99)
Potassium: 4.3 mEq/L (ref 3.7–5.3)
Sodium: 144 mEq/L (ref 137–147)

## 2014-04-11 LAB — CBC
HCT: 38.4 % (ref 36.0–46.0)
Hemoglobin: 12.6 g/dL (ref 12.0–15.0)
MCH: 30.1 pg (ref 26.0–34.0)
MCHC: 32.8 g/dL (ref 30.0–36.0)
MCV: 91.6 fL (ref 78.0–100.0)
Platelets: 190 10*3/uL (ref 150–400)
RBC: 4.19 MIL/uL (ref 3.87–5.11)
RDW: 14.4 % (ref 11.5–15.5)
WBC: 6 10*3/uL (ref 4.0–10.5)

## 2014-04-11 LAB — PROTIME-INR
INR: 1 (ref 0.00–1.49)
Prothrombin Time: 13.2 seconds (ref 11.6–15.2)

## 2014-04-11 MED ORDER — HYDROCODONE-ACETAMINOPHEN 5-325 MG PO TABS
ORAL_TABLET | ORAL | Status: AC
  Filled 2014-04-11: qty 2

## 2014-04-11 MED ORDER — SODIUM CHLORIDE 0.9 % IV SOLN: INTRAVENOUS | Status: AC

## 2014-04-11 MED ORDER — IOHEXOL 300 MG/ML  SOLN
150.0000 mL | Freq: Once | INTRAMUSCULAR | Status: AC | PRN
  Administered 2014-04-11: 50 mL via INTRA_ARTERIAL

## 2014-04-11 MED ORDER — MIDAZOLAM HCL 2 MG/2ML IJ SOLN
INTRAMUSCULAR | Status: AC | PRN
  Administered 2014-04-11: 0.5 mg via INTRAVENOUS

## 2014-04-11 MED ORDER — SODIUM CHLORIDE 0.9 % IV SOLN
INTRAVENOUS | Status: AC | PRN
  Administered 2014-04-11: 500 mL/h via INTRAVENOUS

## 2014-04-11 MED ORDER — MIDAZOLAM HCL 2 MG/2ML IJ SOLN
INTRAMUSCULAR | Status: AC
  Filled 2014-04-11: qty 2

## 2014-04-11 MED ORDER — HYDROCODONE-ACETAMINOPHEN 5-325 MG PO TABS
1.0000 | ORAL_TABLET | ORAL | Status: DC | PRN
  Administered 2014-04-11: 2 via ORAL

## 2014-04-11 MED ORDER — FENTANYL CITRATE 0.05 MG/ML IJ SOLN
INTRAMUSCULAR | Status: AC | PRN
  Administered 2014-04-11: 12.5 ug via INTRAVENOUS

## 2014-04-11 MED ORDER — SODIUM CHLORIDE 0.9 % IV SOLN
INTRAVENOUS | Status: DC
  Administered 2014-04-11: 08:00:00 via INTRAVENOUS

## 2014-04-11 MED ORDER — HEPARIN SODIUM (PORCINE) 1000 UNIT/ML IJ SOLN
INTRAMUSCULAR | Status: AC | PRN
  Administered 2014-04-11: 1000 [IU] via INTRAVENOUS

## 2014-04-11 MED ORDER — LIDOCAINE HCL 1 % IJ SOLN
INTRAMUSCULAR | Status: AC
  Filled 2014-04-11: qty 20

## 2014-04-11 MED ORDER — FENTANYL CITRATE 0.05 MG/ML IJ SOLN
INTRAMUSCULAR | Status: AC
  Filled 2014-04-11: qty 2

## 2014-04-11 NOTE — Procedures (Signed)
S/P Lt common carotid and  Lt verrt arteriograms. 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

## 2014-04-11 NOTE — Sedation Documentation (Signed)
MD at bedside. Discuss findings with pt and family

## 2014-04-11 NOTE — Discharge Instructions (Signed)

## 2014-04-11 NOTE — Sedation Documentation (Signed)
Patient denies pain and is resting comfortably.  

## 2014-04-11 NOTE — H&P (Signed)
Chief Complaint: "I'm here for an angiogram"  HPI: [name] is an 58 y.o. female with hx of endovascularly treated (L)PICA aneurysm in 2014. She also has a left pericallosal aneurysm. She has done well from her treatment and remains on ASA and Plavix. She is scheduled today for diagnostic cerebral arteriogram today. PMHx, meds reviewed. Has been NPO this am Feels well, no recent fever, illness.  Past Medical History:  Past Medical History  Diagnosis Date  . Asthma   . COPD (chronic obstructive pulmonary disease)      - PFTs  01/24/06 FEV1 69% ratio 68% diffusing capacity 57% with no improvement after B2   - PFT's 10/1/ 09 FEV1 77   ratio  76    dlco                   45            no resp to B2    - Nl alpha one antitripsin level 10/09  . Hyperlipidemia   . Aneurysm   . Depression   . Anxiety   . Headache(784.0)   . Hypertension   . Hypothyroidism   . Pneumonia     hx  . Stroke 2011    rt sided weakness  . Anemia     hx  . History of kidney stones   . Seizure disorder, secondary     2-3 focal siezures monthly  . Seizures   . Short-term memory loss     since stroke  . TMJ (temporomandibular joint disorder)   . Increased urinary frequency   . Hammer toe   . Benign hypertensive heart disease   . Hemiparesis affecting dominant side as late effect of cerebrovascular accident   . Pure hypercholesterolemia   . Obesity   . Vitamin D deficiency   . Recurrent major depressive episodes   . Chest pain   . Difficulty speaking   . Malaise and fatigue   . Atrophic vaginitis   . Enthesopathy of elbow region   . GERD (gastroesophageal reflux disease)   . Cardiomegaly   . Urinary tract infectious disease   . Disorder of nasal cavity   . Abnormal gait   . Amnesia   . Low compliance bladder   . Dyspnea   . Vertigo as late effect of stroke   . Skin sensation disturbance   . Nonruptured cerebral aneurysm   . Cough   . Swelling of limb     Past Surgical History:   Past Surgical History  Procedure Laterality Date  . Laparoscopic hysterectomy    . Mandible fracture surgery    . Back surgery    . Cerebral angiogram      x3  . Stones      no surgery  . Radiology with anesthesia N/A 08/21/2013    Procedure: RADIOLOGY WITH ANESTHESIA;  Surgeon: Rob Hickman, MD;  Location: [facility];  Service: Radiology;  Laterality: N/A;    Family History:  Family History  Problem Relation Age of Onset  . Heart disease Father   . Heart attack Father   . Lung cancer    . Heart disease Mother     Social History:  reports that she quit smoking about 5 years ago. She has never used smokeless tobacco. She reports that she does not drink alcohol or use illicit drugs.  Allergies:  Allergies  Allergen Reactions  . Phenytoin Hives and Swelling    Medications:   Medication  List    ASK your doctor about these medications       ALPRAZolam 0.25 MG tablet  Commonly known as:  XANAX  Take 0.25 mg by mouth 2 (two) times daily as needed for anxiety or sleep.     aspirin 325 MG tablet  Take 325 mg by mouth daily.     Biotin 10 MG Caps  Take 10,000 mcg by mouth daily.     CALCIUM 500 +D 500-400 MG-UNIT Tabs  Generic drug:  Calcium Carb-Cholecalciferol  Take 1 tablet by mouth daily.     citalopram 40 MG tablet  Commonly known as:  CELEXA  Take 40 mg by mouth at bedtime.     clindamycin 1 % external solution  Commonly known as:  CLEOCIN T  Apply 1 application topically 2 (two) times daily. Apply to face     clopidogrel 75 MG tablet  Commonly known as:  PLAVIX  Take 75 mg by mouth daily with breakfast.     cyclobenzaprine 10 MG tablet  Commonly known as:  FLEXERIL  Take 10 mg by mouth at bedtime.     diclofenac sodium 1 % Gel  Commonly known as:  VOLTAREN  Apply 2 g topically 3 (three) times daily as needed. For pain     famotidine 20 MG tablet  Commonly known as:  PEPCID  Take 20 mg by mouth 2 (two) times daily.     levETIRAcetam 500 MG  tablet  Commonly known as:  KEPPRA  Take 1 tablet (500 mg total) by mouth every 12 (twelve) hours.     levothyroxine 50 MCG tablet  Commonly known as:  SYNTHROID, LEVOTHROID  Take 50 mcg by mouth daily before breakfast.     losartan 100 MG tablet  Commonly known as:  COZAAR  Take 100 mg by mouth every morning.     rosuvastatin 10 MG tablet  Commonly known as:  CRESTOR  Take 10 mg by mouth at bedtime.     SUPER B COMPLEX PO  Take 1 tablet by mouth daily.     SUPER CRANBERRY/VITAMIN D3 PO  Take 1 tablet by mouth 2 (two) times daily.     topiramate 25 MG tablet  Commonly known as:  TOPAMAX  Take 25mg  in the morning and 50mg  in the evening     VITAMIN B-12 PO  Take 1,000 mcg by mouth every morning.     Vitamin D 2000 UNITS Caps  Take 2,000 Units by mouth 2 (two) times daily.        Please HPI for pertinent positives, otherwise complete 10 system ROS negative.  Physical Exam: BP 106/68  Pulse 73  Temp(Src) 97.5 F (36.4 C) (Oral)  Resp 18  Ht 5\' 2"  (1.575 m)  Wt 135 lb (61.236 kg)  BMI 24.69 kg/m2  SpO2 96% Body mass index is 24.69 kg/(m^2).   General Appearance:  Alert, cooperative, no distress, appears stated age  Head:  Normocephalic, without obvious abnormality, atraumatic  ENT: Unremarkable  Neck: Supple, symmetrical, trachea midline  Lungs:   Clear to auscultation bilaterally, no w/r/r, respirations unlabored without use of accessory muscles.  Heart:  Regular rate and rhythm, S1, S2 normal, no murmur, rub or gallop.  Extremities: Extremities normal, atraumatic, no cyanosis or edema  Pulses: 2+ and symmetric femoral  Neurologic: Normal affect, no gross deficits.   Results for orders placed during the hospital encounter of 04/11/14 (from the past 48 hour(s))  CBC     Status: None  Collection Time    04/11/14  7:21 AM      Result Value Ref Range   WBC 6.0  4.0 - 10.5 K/uL   RBC 4.19  3.87 - 5.11 MIL/uL   Hemoglobin 12.6  12.0 - 15.0 g/dL   HCT 38.4   36.0 - 46.0 %   MCV 91.6  78.0 - 100.0 fL   MCH 30.1  26.0 - 34.0 pg   MCHC 32.8  30.0 - 36.0 g/dL   RDW 14.4  11.5 - 15.5 %   Platelets 190  150 - 400 K/uL  PROTIME-INR     Status: None   Collection Time    04/11/14  7:21 AM      Result Value Ref Range   Prothrombin Time 13.2  11.6 - 15.2 seconds   INR 1.00  0.00 - 1.49   No results found.  Assessment/Plan Hx of treated (L)PICA aneurysm. Also has (L)pericallosal aneurysm For follow up cerebral agram today Procedure, risks, complications, use of sedation reviewed. Labs reviewed Consent signed in chart  Ascencion Dike PA-C 04/11/2014, 8:07 AM

## 2014-04-16 ENCOUNTER — Other Ambulatory Visit: Payer: Self-pay

## 2014-04-16 DIAGNOSIS — Z1231 Encounter for screening mammogram for malignant neoplasm of breast: Secondary | ICD-10-CM

## 2014-04-16 DIAGNOSIS — R42 Dizziness and giddiness: Secondary | ICD-10-CM | POA: Diagnosis not present

## 2014-04-16 DIAGNOSIS — I671 Cerebral aneurysm, nonruptured: Secondary | ICD-10-CM | POA: Diagnosis not present

## 2014-04-16 DIAGNOSIS — Z0000 Encounter for general adult medical examination without abnormal findings: Secondary | ICD-10-CM | POA: Diagnosis not present

## 2014-04-16 DIAGNOSIS — E039 Hypothyroidism, unspecified: Secondary | ICD-10-CM | POA: Diagnosis not present

## 2014-04-16 DIAGNOSIS — I10 Essential (primary) hypertension: Secondary | ICD-10-CM | POA: Diagnosis not present

## 2014-04-16 DIAGNOSIS — I69998 Other sequelae following unspecified cerebrovascular disease: Secondary | ICD-10-CM | POA: Diagnosis not present

## 2014-04-16 DIAGNOSIS — E559 Vitamin D deficiency, unspecified: Secondary | ICD-10-CM | POA: Diagnosis not present

## 2014-04-24 ENCOUNTER — Ambulatory Visit
Admission: RE | Admit: 2014-04-24 | Discharge: 2014-04-24 | Disposition: A | Discharge status: Home / Self Care | Payer: Medicare Other | Source: Ambulatory Visit

## 2014-04-24 ENCOUNTER — Encounter (INDEPENDENT_AMBULATORY_CARE_PROVIDER_SITE_OTHER): Payer: Self-pay

## 2014-04-24 DIAGNOSIS — Z1231 Encounter for screening mammogram for malignant neoplasm of breast: Secondary | ICD-10-CM | POA: Diagnosis not present

## 2014-05-07 ENCOUNTER — Encounter: Payer: Self-pay | Admitting: Nurse Practitioner

## 2014-05-07 ENCOUNTER — Ambulatory Visit (INDEPENDENT_AMBULATORY_CARE_PROVIDER_SITE_OTHER): Payer: Medicare Other | Admitting: Nurse Practitioner

## 2014-05-07 VITALS — BP 106/69 | HR 77 | Ht 63.0 in | Wt 143.0 lb

## 2014-05-07 DIAGNOSIS — R269 Unspecified abnormalities of gait and mobility: Secondary | ICD-10-CM | POA: Diagnosis not present

## 2014-05-07 DIAGNOSIS — Z5181 Encounter for therapeutic drug level monitoring: Secondary | ICD-10-CM

## 2014-05-07 DIAGNOSIS — R51 Headache: Secondary | ICD-10-CM | POA: Diagnosis not present

## 2014-05-07 DIAGNOSIS — M216X9 Other acquired deformities of unspecified foot: Secondary | ICD-10-CM

## 2014-05-07 DIAGNOSIS — R569 Unspecified convulsions: Secondary | ICD-10-CM | POA: Diagnosis not present

## 2014-05-07 DIAGNOSIS — I619 Nontraumatic intracerebral hemorrhage, unspecified: Secondary | ICD-10-CM | POA: Diagnosis not present

## 2014-05-07 DIAGNOSIS — M21371 Foot drop, right foot: Secondary | ICD-10-CM

## 2014-05-07 NOTE — Progress Notes (Signed)
[facility]  PATIENT: [name] DOB: 1955-10-12   REASON FOR VISIT: Followup for left frontoparietal parenchymal hemorrhage March 2011   HISTORY OF PRESENT ILLNESS:[name], 58 year old female returns for followup. She was last seen in our office 09/19/13.  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 She claims she has been having more headaches recently she is currently on Topamax 75 mg daily. She remains on Keppra and states she does occasionally have spells which she describes as staring off for several seconds. She has not kept a record of these. She recently 04/11/14 had IR angio left vertebral arteriogram . Interval decrease in the size of the left posterior inferior cerebellar  region aneurysm and she was asked to continue her aspirin and Plavix would repeat CT angio in 6 months by Dr. Britt Bottom  She continues with right-sided weakness from her previous stroke in 2011. She returns for routine followup.   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  She returns for followup after last visit on 09/20/12. She continues to have headaches several times a week , she does not describe them as disabling.. She remains on Topamax 25mg  tab, total dose of 75mg  daily, is tolerating well without side effects but complains of mild memory loss. She underwent cerebral catheter angiogram on 10//13 by Dr.Deveshwar which showed stable appearance of pericallosal and left P2/P3 segment aneurysms. She states some mild tremor in  the hand continues and it is not disabling.She complains of persistent mild right-sided weakness as well as some temperature sensitivity and sensation of coldness of the right foot ever since her stroke in 2011.She does feel her AFO tens unit is effective. She has fallen recently.      REVIEW OF SYSTEMS: Full 14 system review of systems performed and notable only for those listed, all others are neg:  Constitutional: Fatigue Cardiovascular: N/A  Ear/Nose/Throat: N/A  Skin: N/A  Eyes: Blurred vision Respiratory: N/A  Gastroitestinal: Constipation Hematology/Lymphatic: Easy bruising  Endocrine: N/A Musculoskeletal: Back pain, achy muscles neck pain Allergy/Immunology: N/A  Neurological: Headache, weakness  Psychiatric: Anxiety Sleep : Daytime sleepiness  ALLERGIES: Allergies  Allergen Reactions  . Phenytoin Hives and Swelling    HOME MEDICATIONS: Outpatient Prescriptions Prior to Visit  Medication Sig Dispense Refill  . ALPRAZolam (XANAX) 0.25 MG tablet Take 0.25 mg by mouth 2 (two) times daily as needed for anxiety or sleep.       Marland Kitchen aspirin 325 MG tablet Take 325 mg by mouth daily.      . B Complex-C (SUPER B COMPLEX PO) Take 1 tablet by mouth daily.      . Biotin 10 MG CAPS Take 10,000 mcg by mouth daily.      . Calcium Carb-Cholecalciferol (CALCIUM 500 +D) 500-400 MG-UNIT TABS Take 1 tablet by mouth daily.      . Cholecalciferol (VITAMIN D) 2000 UNITS CAPS Take 2,000 Units by mouth 2 (two) times daily.      . citalopram (CELEXA) 40 MG tablet Take 40 mg by mouth at bedtime.      . clindamycin (CLEOCIN T) 1 % external solution Apply 1 application topically  2 (two) times daily. Apply to face      . clopidogrel (PLAVIX) 75 MG tablet Take 75 mg by mouth daily with breakfast.      . Cranberry-Cholecalciferol (SUPER CRANBERRY/VITAMIN D3 PO) Take 1 tablet by mouth 2 (two) times daily.       . Cyanocobalamin (VITAMIN B-12 PO) Take 1,000 mcg by mouth every morning.      .  cyclobenzaprine (FLEXERIL) 10 MG tablet Take 10 mg by mouth at bedtime.       . diclofenac sodium (VOLTAREN) 1 % GEL Apply 2 g topically 3 (three) times daily as needed. For pain      . famotidine (PEPCID) 20 MG tablet Take 20 mg by mouth 2 (two) times daily.       Marland Kitchen levETIRAcetam (KEPPRA) 500 MG tablet Take 1 tablet (500 mg total) by mouth every 12 (twelve) hours.  60 tablet  6  . levothyroxine (SYNTHROID, LEVOTHROID) 50 MCG tablet Take 50 mcg by mouth daily before breakfast.       . losartan (COZAAR) 100 MG tablet Take 100 mg by mouth every morning.       . rosuvastatin (CRESTOR) 10 MG tablet Take 10 mg by mouth at bedtime.       . topiramate (TOPAMAX) 25 MG tablet Take 25mg  in the morning and 50mg  in the evening  90 tablet  6   No facility-administered medications prior to visit.    PAST MEDICAL HISTORY: Past Medical History  Diagnosis Date  . Asthma   . COPD (chronic obstructive pulmonary disease)      - PFTs  01/24/06 FEV1 69% ratio 68% diffusing capacity 57% with no improvement after B2   - PFT's 10/1/ 09 FEV1 77   ratio  76    dlco                   45            no resp to B2    - Nl alpha one antitripsin level 10/09  . Hyperlipidemia   . Aneurysm   . Depression   . Anxiety   . Headache(784.0)   . Hypertension   . Hypothyroidism   . Pneumonia     hx  . Stroke 2011    rt sided weakness  . Anemia     hx  . History of kidney stones   . Seizure disorder, secondary     2-3 focal siezures monthly  . Seizures   . Short-term memory loss     since stroke  . TMJ (temporomandibular joint disorder)   . Increased urinary frequency   . Hammer toe   . Benign hypertensive heart disease   . Hemiparesis affecting dominant side as late effect of cerebrovascular accident   . Pure hypercholesterolemia   . Obesity   . Vitamin D deficiency   . Recurrent major depressive episodes   . Chest pain   . Difficulty speaking   . Malaise and fatigue   . Atrophic vaginitis   . Enthesopathy of  elbow region   . GERD (gastroesophageal reflux disease)   . Cardiomegaly   . Urinary tract infectious disease   . Disorder of nasal cavity   . Abnormal gait   . Amnesia   . Low compliance bladder   . Dyspnea   . Vertigo as late effect of stroke   . Skin sensation disturbance   . Nonruptured cerebral aneurysm   . Cough   .  Swelling of limb     PAST SURGICAL HISTORY: Past Surgical History  Procedure Laterality Date  . Laparoscopic hysterectomy    . Mandible fracture surgery    . Back surgery    . Cerebral angiogram      x3  . Stones      no surgery  . Radiology with anesthesia N/A 08/21/2013    Procedure: RADIOLOGY WITH ANESTHESIA;  Surgeon: Rob Hickman, MD;  Location: [facility];  Service: Radiology;  Laterality: N/A;    FAMILY HISTORY: Family History  Problem Relation Age of Onset  . Heart disease Father   . Heart attack Father   . Lung cancer    . Heart disease Mother     SOCIAL HISTORY: History   Social History  . Marital Status: Married    Spouse Name: Jori Moll    Number of Children: 2  . Years of Education: 14   Occupational History  . Disabled-former accountant    Social History Main Topics  . Smoking status: Former Smoker    Quit date: 09/05/2008  . Smokeless tobacco: Never Used  . Alcohol Use: No     Comment: patient quit alcohol use 2005.   . Drug Use: No  . Sexual Activity: Not on file   Other Topics Concern  . Not on file   Social History Narrative   Patient lives at home with her husband Jori Moll).   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    Patient is on disability.    Patient has an Associates degree.   Patient is right-handed.   Patient drinks 2 cups of green tea most days.                 PHYSICAL EXAM  Filed Vitals:   05/07/14 1439  BP: 106/69  Pulse: 77  Height: 5\' 3"  (1.6 m)  Weight: 143 lb (64.864 kg)   Body mass index is 25.34 kg/(m^2). Generalized: Well developed, in  no acute distress  Head: normocephalic and atraumatic,. Oropharynx benign  Neck: Supple, no carotid bruits  Cardiac: Regular rate rhythm, no murmur  Neurological examination  Mentation: Alert oriented to time, place, history taking. Follows all commands speech and language fluent  Cranial nerve II-XII: Fundi not visualized.Pupils were equal round reactive to light extraocular movements were full, visual field were full on confrontational test. Facial is asymmetric with right lower face weakness . Hearing was intact to finger rubbing bilaterally. Uvula tongue midline. head turning and shoulder shrug were normal and symmetric.Tongue protrusion into cheek strength was normal.  Motor: Mild 4/5 right-sided hemiparesis with distal more than proximal weakness. Increased tone on the right upper and lower extremity. Diminished fine finger movements on the right . Mild right foot drop with TENS unit in place  Sensory: Touch and pinprick are normal on the left decreased on the right, vibratory is decreased to the right ankle  Coordination: Impaired on the right  Reflexes: Brachioradialis 2/2, biceps 2/2, triceps 2/2, patellar 2/2, Achilles 1/1, plantar responses were flexor bilaterally.  Gait and Station: Rising up from seated position without assistance, wide based , right foot drop, using single-point cane, unsteady gait     DIAGNOSTIC DATA (LABS, IMAGING, TESTING) - I reviewed patient records, labs, notes, testing and imaging myself where available.  Lab Results  Component Value Date   WBC 6.0 04/11/2014   HGB 12.6 04/11/2014   HCT 38.4 04/11/2014   MCV  91.6 04/11/2014   PLT 190 04/11/2014      Component Value Date/Time   NA 144 04/11/2014 0721   K 4.3 04/11/2014 0721   CL 109 04/11/2014 0721   CO2 22 04/11/2014 0721   GLUCOSE 65* 04/11/2014 0721   BUN 18 04/11/2014 0721   CREATININE 0.70 04/11/2014 0721   CALCIUM 9.2 04/11/2014 0721   PROT 8.0 12/04/2013 1801   ALBUMIN 3.9 12/04/2013 1801   AST 43* 12/04/2013 1801    ALT 30 12/04/2013 1801   ALKPHOS 101 12/04/2013 1801   BILITOT 0.4 12/04/2013 1801   GFRNONAA >90 04/11/2014 0721   GFRAA >90 04/11/2014 0721        ASSESSMENT AND PLAN  58 y.o. year old female  has a past medical history of left frontoparietal parenchymal hemorrhage March 2011 from PRES and malignant hypertension. Chronic headaches with history of migraines.Placement of pipeline stent across Left PICA aneurysm by Dr. Estanislado Pandy on 08/21/2013. She complains of spells which she describes as staring off for several seconds.  Increase Topamax  to 2 tablets a.m.and p.m. Continue Keppra dose for now Will get a Keppra level and change if needed Followup in 6-8 months Dennie Bible, [facility], [facility], [facility] [address], [place], [place] [address] [phone]

## 2014-05-07 NOTE — Patient Instructions (Signed)
Increase Topamax  To 2 tablets a.m. And p.m. Continue Keppra dose for now Will get a Keppra level and change if needed Followup in 6-8 months

## 2014-05-08 NOTE — Progress Notes (Signed)
I agree with the above plan 

## 2014-05-09 LAB — LEVETIRACETAM LEVEL: Levetiracetam Lvl: 25.3 ug/mL (ref 10.0–40.0)

## 2014-05-26 DIAGNOSIS — E039 Hypothyroidism, unspecified: Secondary | ICD-10-CM | POA: Diagnosis not present

## 2014-07-10 DIAGNOSIS — I10 Essential (primary) hypertension: Secondary | ICD-10-CM | POA: Diagnosis not present

## 2014-07-10 DIAGNOSIS — I69998 Other sequelae following unspecified cerebrovascular disease: Secondary | ICD-10-CM | POA: Diagnosis not present

## 2014-07-10 DIAGNOSIS — E039 Hypothyroidism, unspecified: Secondary | ICD-10-CM | POA: Diagnosis not present

## 2014-07-21 DIAGNOSIS — N390 Urinary tract infection, site not specified: Secondary | ICD-10-CM | POA: Diagnosis not present

## 2014-07-21 DIAGNOSIS — R300 Dysuria: Secondary | ICD-10-CM | POA: Diagnosis not present

## 2014-07-25 DIAGNOSIS — N390 Urinary tract infection, site not specified: Secondary | ICD-10-CM | POA: Diagnosis not present

## 2014-07-29 DIAGNOSIS — N390 Urinary tract infection, site not specified: Secondary | ICD-10-CM | POA: Diagnosis not present

## 2014-07-29 DIAGNOSIS — K760 Fatty (change of) liver, not elsewhere classified: Secondary | ICD-10-CM | POA: Diagnosis not present

## 2014-08-11 DIAGNOSIS — H04123 Dry eye syndrome of bilateral lacrimal glands: Secondary | ICD-10-CM | POA: Diagnosis not present

## 2014-08-11 DIAGNOSIS — H524 Presbyopia: Secondary | ICD-10-CM | POA: Diagnosis not present

## 2014-08-21 DIAGNOSIS — Z13820 Encounter for screening for osteoporosis: Secondary | ICD-10-CM | POA: Diagnosis not present

## 2014-08-21 DIAGNOSIS — I10 Essential (primary) hypertension: Secondary | ICD-10-CM | POA: Diagnosis not present

## 2014-08-21 DIAGNOSIS — R202 Paresthesia of skin: Secondary | ICD-10-CM | POA: Diagnosis not present

## 2014-08-21 DIAGNOSIS — E039 Hypothyroidism, unspecified: Secondary | ICD-10-CM | POA: Diagnosis not present

## 2014-08-21 DIAGNOSIS — F339 Major depressive disorder, recurrent, unspecified: Secondary | ICD-10-CM | POA: Diagnosis not present

## 2014-09-11 DIAGNOSIS — I10 Essential (primary) hypertension: Secondary | ICD-10-CM | POA: Diagnosis not present

## 2014-09-11 DIAGNOSIS — J069 Acute upper respiratory infection, unspecified: Secondary | ICD-10-CM | POA: Diagnosis not present

## 2014-11-05 ENCOUNTER — Encounter: Payer: Self-pay | Admitting: Neurology

## 2014-11-05 ENCOUNTER — Ambulatory Visit (INDEPENDENT_AMBULATORY_CARE_PROVIDER_SITE_OTHER): Payer: Medicare Other | Admitting: Neurology

## 2014-11-05 VITALS — BP 114/75 | HR 84 | Ht 63.0 in | Wt 153.6 lb

## 2014-11-05 DIAGNOSIS — G441 Vascular headache, not elsewhere classified: Secondary | ICD-10-CM | POA: Diagnosis not present

## 2014-11-05 NOTE — Patient Instructions (Signed)
I had a long discussion with the patient and her husband regarding her headaches which seem to be doing a lot better. Since she is having some memory issues I would recommend tapering the Topamax to 25 mg twice daily to see if that is helpful. She was also advised to use a cane when walking at all times for balance purposes. She was encouraged to increase the fiber content of her diet and if her constipation persists to see her primary physician for further evaluation for that. Continue Keppra for seizures. Return for follow-up in 6 months with Cecille Rubin, nurse practitioner or call earlier if necessary

## 2014-11-05 NOTE — Progress Notes (Signed)
[facility]  PATIENT: [name] DOB: [birth year]   REASON FOR VISIT: Followup for left frontoparietal parenchymal hemorrhage March 2011   HISTORY OF PRESENT ILLNESS:Ms Touchton, 59 year old female returns for followup. She was last seen in our office 09/19/13.  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 She claims she has been having more headaches recently she is currently on Topamax 75 mg daily. She remains on Keppra and states she does occasionally have spells which she describes as staring off for several seconds. She has not kept a record of these. She recently 04/11/14 had IR angio left vertebral arteriogram . Interval decrease in the size of the left posterior inferior cerebellar  region aneurysm and she was asked to continue her aspirin and Plavix would repeat CT angio in 6 months by Dr. Britt Bottom  She continues with right-sided weakness from her previous stroke in 2011. She returns for routine followup.   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  She returns for followup after last visit on 09/20/12. She continues to have headaches several times a week , she does not describe them as disabling.. She remains on Topamax 25mg  tab, total dose of 75mg  daily, is tolerating well without side effects but complains of mild memory loss. She underwent cerebral catheter angiogram on 10//13 by Dr.Deveshwar which showed stable appearance of pericallosal and left P2/P3 segment aneurysms. She states some mild tremor in  the hand continues and it is not disabling.She complains of persistent mild right-sided weakness as well as some temperature sensitivity and sensation of coldness of the right foot ever since her stroke in 2011.She does feel her AFO tens unit is effective. She has fallen recently.  Update 11/05/2014 : She returns for follow-up after last visit 6 months ago. She states her headaches are doing much better she is currently on Topamax 50 mg twice daily which seems to help and she is tolerating it well but does complain of some mild short-term memory difficulties which are beginning to bother her. She states her tremors are doing quite well and are not bothersome. She has not had any further seizures. She does complain of occasional fluttering of both her eyes but this is transient and is not similar to her prior seizures. She continues to have right-sided weakness and gait difficulty and incoordination is unchanged. She walks with a foot brace.     REVIEW OF SYSTEMS: Full 14 system review of systems performed and notable only for those listed, all others are neg:  Headache, tremors, eyes fluttering,gait difficulty and all other systems negative ALLERGIES: Allergies  Allergen Reactions  . Phenytoin Hives and Swelling    HOME MEDICATIONS: Outpatient Prescriptions Prior to Visit  Medication Sig Dispense Refill  . ALPRAZolam (XANAX) 0.25 MG tablet Take 0.25 mg by mouth 2 (two) times daily as needed for anxiety or sleep.     . B Complex-C (SUPER B COMPLEX PO) Take 1 tablet by mouth daily.    . Biotin 10 MG CAPS Take 10,000 mcg by mouth daily.    . Calcium Carb-Cholecalciferol (CALCIUM 500 +D) 500-400 MG-UNIT TABS Take 1 tablet by mouth  daily.    . Cholecalciferol (VITAMIN D) 2000 UNITS CAPS Take 2,000 Units by mouth 2 (two) times daily.    . citalopram (CELEXA) 40 MG tablet Take 40 mg by mouth at bedtime.    . clindamycin (CLEOCIN T) 1 % external solution Apply 1 application topically 2 (two) times daily.  Apply to face    . clopidogrel (PLAVIX) 75 MG tablet Take 75 mg by mouth daily with breakfast.    . Cranberry-Cholecalciferol (SUPER CRANBERRY/VITAMIN D3 PO) Take 1 tablet by mouth 2 (two) times daily.     . Cyanocobalamin (VITAMIN B-12 PO) Take 1,000 mcg by mouth every morning.    . cyclobenzaprine (FLEXERIL) 10 MG tablet Take 10 mg by mouth at bedtime.     . diclofenac sodium (VOLTAREN) 1 % GEL Apply 2 g topically 3 (three) times daily as needed. For pain    . famotidine (PEPCID) 20 MG tablet Take 20 mg by mouth 2 (two) times daily.     Marland Kitchen levETIRAcetam (KEPPRA) 500 MG tablet Take 1 tablet (500 mg total) by mouth every 12 (twelve) hours. 60 tablet 6  . levothyroxine (SYNTHROID, LEVOTHROID) 50 MCG tablet Take 50 mcg by mouth daily before breakfast.     . losartan (COZAAR) 100 MG tablet Take 100 mg by mouth every morning.     . rosuvastatin (CRESTOR) 10 MG tablet Take 10 mg by mouth at bedtime.     . topiramate (TOPAMAX) 25 MG tablet Take 50 mg by mouth 2 (two) times daily.    Marland Kitchen aspirin 325 MG tablet Take 325 mg by mouth daily.     No facility-administered medications prior to visit.    PAST MEDICAL HISTORY: Past Medical History  Diagnosis Date  . Asthma   . COPD (chronic obstructive pulmonary disease)      - PFTs  01/24/06 FEV1 69% ratio 68% diffusing capacity 57% with no improvement after B2   - PFT's 10/1/ 09 FEV1 77   ratio  76    dlco                   45            no resp to B2    - Nl alpha one antitripsin level 10/09  . Hyperlipidemia   . Aneurysm   . Depression   . Anxiety   . Headache(784.0)   . Hypertension   . Hypothyroidism   . Pneumonia     hx  . Stroke 2011    rt sided weakness  . Anemia     hx  . History of kidney stones   . Seizure disorder, secondary     2-3 focal siezures monthly  . Seizures   . Short-term memory loss     since stroke  . TMJ (temporomandibular joint disorder)   . Increased urinary frequency   . Hammer toe   . Benign hypertensive heart  disease   . Hemiparesis affecting dominant side as late effect of cerebrovascular accident   . Pure hypercholesterolemia   . Obesity   . Vitamin D deficiency   . Recurrent major depressive episodes   . Chest pain   . Difficulty speaking   . Malaise and fatigue   . Atrophic vaginitis   . Enthesopathy of elbow region   . GERD (gastroesophageal reflux disease)   . Cardiomegaly   . Urinary tract infectious disease   . Disorder of nasal cavity   . Abnormal gait   .  Amnesia   . Low compliance bladder   . Dyspnea   . Vertigo as late effect of stroke   . Skin sensation disturbance   . Nonruptured cerebral aneurysm   . Cough   . Swelling of limb     PAST SURGICAL HISTORY: Past Surgical History  Procedure Laterality Date  . Laparoscopic hysterectomy    . Mandible fracture surgery    . Back surgery    . Cerebral angiogram      x3  . Stones      no surgery  . Radiology with anesthesia N/A 08/21/2013    Procedure: RADIOLOGY WITH ANESTHESIA;  Surgeon: Rob Hickman, MD;  Location: [place];  Service: Radiology;  Laterality: N/A;    FAMILY HISTORY: Family History  Problem Relation Age of Onset  . Heart disease Father   . Heart attack Father   . Lung cancer    . Heart disease Mother     SOCIAL HISTORY: History   Social History  . Marital Status: Married    Spouse Name: Jori Moll  . Number of Children: 2  . Years of Education: 14   Occupational History  . Disabled-former accountant    Social History Main Topics  . Smoking status: Former Smoker    Quit date: 09/05/2008  . Smokeless tobacco: Never Used  . Alcohol Use: No     Comment: patient quit alcohol use 2005.   . Drug Use: No  . Sexual Activity: Not on file   Other Topics Concern  . Not on file   Social History Narrative   Patient lives at home with her husband Jori Moll).   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    Patient is on disability.    Patient has an  Associates degree.   Patient is right-handed.   Patient drinks 2 cups of green tea most days.                 PHYSICAL EXAM  Filed Vitals:   11/05/14 0940  BP: 114/75  Pulse: 84  Height: 5\' 3"  (1.6 m)  Weight: 153 lb 9.6 oz (69.673 kg)   Body mass index is 27.22 kg/(m^2). Generalized: Well developed, in no acute distress  Head: normocephalic and atraumatic,.    Neck: Supple, no carotid bruits  Cardiac: Regular rate rhythm, no murmur  Neurological examination  Mentation: Alert oriented to time, place, history taking. Follows all commands speech and language fluent mini-mental status exam 28/30 with deficits in orientation and recall.AFT 14.GDS 6. Clock Drawing 4/4. Cranial nerve II-XII: Fundi not visualized.Pupils were equal round reactive to light extraocular movements were full, visual field were full on confrontational test. Facial is asymmetric with right lower face weakness . Hearing was intact to finger rubbing bilaterally. Uvula tongue midline. head turning and shoulder shrug were normal and symmetric.Tongue protrusion into cheek strength was normal.  Motor: Mild 4/5 right-sided hemiparesis with distal more than proximal weakness. Increased tone on the right upper and lower extremity. Diminished fine finger movements on the right . Mild right foot drop with footbracet in place  Sensory: Touch and pinprick are normal on the left decreased on the right, vibratory is decreased to the right ankle  Coordination: Impaired on the right  Reflexes: Brachioradialis 2/2, biceps 2/2, triceps 2/2, patellar 2/2, Achilles 1/1, plantar responses were flexor bilaterally.  Gait and Station: Rising up from seated position without assistance, wide based , right foot  drop, using single-point cane, unsteady gait     DIAGNOSTIC DATA (LABS, IMAGING, TESTING) - I reviewed patient records, labs, notes, testing and imaging myself where available.  Lab Results  Component Value Date   WBC 6.0  04/11/2014   HGB 12.6 04/11/2014   HCT 38.4 04/11/2014   MCV 91.6 04/11/2014   PLT 190 04/11/2014      Component Value Date/Time   NA 144 04/11/2014 0721   K 4.3 04/11/2014 0721   CL 109 04/11/2014 0721   CO2 22 04/11/2014 0721   GLUCOSE 65* 04/11/2014 0721   BUN 18 04/11/2014 0721   CREATININE 0.70 04/11/2014 0721   CALCIUM 9.2 04/11/2014 0721   PROT 8.0 12/04/2013 1801   ALBUMIN 3.9 12/04/2013 1801   AST 43* 12/04/2013 1801   ALT 30 12/04/2013 1801   ALKPHOS 101 12/04/2013 1801   BILITOT 0.4 12/04/2013 1801   GFRNONAA >90 04/11/2014 0721   GFRAA >90 04/11/2014 0721        ASSESSMENT AND PLAN  59 y.o. year old female  has a past medical history of left frontoparietal parenchymal hemorrhage March 2011 from PRES and malignant hypertension. Chronic headaches with history of migraines.Placement of pipeline stent across Left PICA aneurysm by Dr. Estanislado Pandy on 08/21/2013. She complains of spells which she describes as staring off for several seconds. I had a long discussion with the patient and her husband regarding her headaches which seem to be doing a lot better. Since she is having some memory issues I would recommend tapering the Topamax to 25 mg twice daily to see if that is helpful. She was also advised to use a cane when walking at all times for balance purposes. She was encouraged to increase the fiber content of her diet and if her constipation persists to see her primary physician for further evaluation for that. Continue Keppra for seizures. Return for follow-up in 6 months with Cecille Rubin, nurse practitioner or call earlier if necessary  Antony Contras, MD [facility] [address] [place], [place] [address] [phone]

## 2014-12-16 DIAGNOSIS — E049 Nontoxic goiter, unspecified: Secondary | ICD-10-CM | POA: Diagnosis not present

## 2014-12-16 DIAGNOSIS — E011 Iodine-deficiency related multinodular (endemic) goiter: Secondary | ICD-10-CM | POA: Diagnosis not present

## 2014-12-16 DIAGNOSIS — R220 Localized swelling, mass and lump, head: Secondary | ICD-10-CM | POA: Diagnosis not present

## 2014-12-16 DIAGNOSIS — K5900 Constipation, unspecified: Secondary | ICD-10-CM | POA: Diagnosis not present

## 2014-12-16 DIAGNOSIS — R7309 Other abnormal glucose: Secondary | ICD-10-CM | POA: Diagnosis not present

## 2014-12-16 DIAGNOSIS — I10 Essential (primary) hypertension: Secondary | ICD-10-CM | POA: Diagnosis not present

## 2014-12-16 DIAGNOSIS — E039 Hypothyroidism, unspecified: Secondary | ICD-10-CM | POA: Diagnosis not present

## 2014-12-16 DIAGNOSIS — R194 Change in bowel habit: Secondary | ICD-10-CM | POA: Diagnosis not present

## 2014-12-16 DIAGNOSIS — Z1389 Encounter for screening for other disorder: Secondary | ICD-10-CM | POA: Diagnosis not present

## 2015-01-12 DIAGNOSIS — N390 Urinary tract infection, site not specified: Secondary | ICD-10-CM | POA: Diagnosis not present

## 2015-01-12 DIAGNOSIS — R358 Other polyuria: Secondary | ICD-10-CM | POA: Diagnosis not present

## 2015-01-12 DIAGNOSIS — Z87440 Personal history of urinary (tract) infections: Secondary | ICD-10-CM | POA: Diagnosis not present

## 2015-01-19 DIAGNOSIS — K5900 Constipation, unspecified: Secondary | ICD-10-CM | POA: Diagnosis not present

## 2015-01-19 DIAGNOSIS — R194 Change in bowel habit: Secondary | ICD-10-CM | POA: Diagnosis not present

## 2015-01-19 DIAGNOSIS — K625 Hemorrhage of anus and rectum: Secondary | ICD-10-CM | POA: Diagnosis not present

## 2015-01-19 DIAGNOSIS — I6789 Other cerebrovascular disease: Secondary | ICD-10-CM | POA: Diagnosis not present

## 2015-01-21 ENCOUNTER — Other Ambulatory Visit (HOSPITAL_COMMUNITY): Payer: Self-pay | Admitting: Interventional Radiology

## 2015-01-21 DIAGNOSIS — I729 Aneurysm of unspecified site: Secondary | ICD-10-CM

## 2015-01-27 ENCOUNTER — Other Ambulatory Visit: Payer: Self-pay | Admitting: Radiology

## 2015-01-28 ENCOUNTER — Other Ambulatory Visit: Payer: Self-pay | Admitting: Radiology

## 2015-01-29 ENCOUNTER — Other Ambulatory Visit (HOSPITAL_COMMUNITY): Payer: Self-pay | Admitting: Interventional Radiology

## 2015-01-29 ENCOUNTER — Encounter (HOSPITAL_COMMUNITY): Payer: Self-pay

## 2015-01-29 ENCOUNTER — Ambulatory Visit (HOSPITAL_COMMUNITY)
Admission: RE | Admit: 2015-01-29 | Discharge: 2015-01-29 | Disposition: A | Discharge status: Home / Self Care | Payer: Medicare Other | Source: Ambulatory Visit | Attending: Interventional Radiology | Admitting: Interventional Radiology

## 2015-01-29 DIAGNOSIS — K219 Gastro-esophageal reflux disease without esophagitis: Secondary | ICD-10-CM | POA: Insufficient documentation

## 2015-01-29 DIAGNOSIS — I6523 Occlusion and stenosis of bilateral carotid arteries: Secondary | ICD-10-CM | POA: Diagnosis not present

## 2015-01-29 DIAGNOSIS — R51 Headache: Secondary | ICD-10-CM | POA: Diagnosis not present

## 2015-01-29 DIAGNOSIS — G40909 Epilepsy, unspecified, not intractable, without status epilepticus: Secondary | ICD-10-CM | POA: Insufficient documentation

## 2015-01-29 DIAGNOSIS — I729 Aneurysm of unspecified site: Secondary | ICD-10-CM | POA: Diagnosis not present

## 2015-01-29 DIAGNOSIS — R200 Anesthesia of skin: Secondary | ICD-10-CM | POA: Diagnosis not present

## 2015-01-29 DIAGNOSIS — E559 Vitamin D deficiency, unspecified: Secondary | ICD-10-CM | POA: Diagnosis not present

## 2015-01-29 DIAGNOSIS — Z7902 Long term (current) use of antithrombotics/antiplatelets: Secondary | ICD-10-CM | POA: Diagnosis not present

## 2015-01-29 DIAGNOSIS — Z79899 Other long term (current) drug therapy: Secondary | ICD-10-CM | POA: Diagnosis not present

## 2015-01-29 DIAGNOSIS — Z87891 Personal history of nicotine dependence: Secondary | ICD-10-CM | POA: Insufficient documentation

## 2015-01-29 DIAGNOSIS — E780 Pure hypercholesterolemia: Secondary | ICD-10-CM | POA: Diagnosis not present

## 2015-01-29 DIAGNOSIS — I671 Cerebral aneurysm, nonruptured: Secondary | ICD-10-CM | POA: Insufficient documentation

## 2015-01-29 DIAGNOSIS — I119 Hypertensive heart disease without heart failure: Secondary | ICD-10-CM | POA: Diagnosis not present

## 2015-01-29 DIAGNOSIS — J45909 Unspecified asthma, uncomplicated: Secondary | ICD-10-CM | POA: Insufficient documentation

## 2015-01-29 DIAGNOSIS — J449 Chronic obstructive pulmonary disease, unspecified: Secondary | ICD-10-CM | POA: Insufficient documentation

## 2015-01-29 DIAGNOSIS — E039 Hypothyroidism, unspecified: Secondary | ICD-10-CM | POA: Diagnosis not present

## 2015-01-29 DIAGNOSIS — F339 Major depressive disorder, recurrent, unspecified: Secondary | ICD-10-CM | POA: Insufficient documentation

## 2015-01-29 DIAGNOSIS — H02401 Unspecified ptosis of right eyelid: Secondary | ICD-10-CM | POA: Insufficient documentation

## 2015-01-29 DIAGNOSIS — D649 Anemia, unspecified: Secondary | ICD-10-CM | POA: Insufficient documentation

## 2015-01-29 DIAGNOSIS — F419 Anxiety disorder, unspecified: Secondary | ICD-10-CM | POA: Diagnosis not present

## 2015-01-29 LAB — BASIC METABOLIC PANEL
Anion gap: 11 (ref 5–15)
BUN: 17 mg/dL (ref 6–20)
CO2: 19 mmol/L — ABNORMAL LOW (ref 22–32)
Calcium: 8.8 mg/dL — ABNORMAL LOW (ref 8.9–10.3)
Chloride: 110 mmol/L (ref 101–111)
Creatinine, Ser: 0.68 mg/dL (ref 0.44–1.00)
GFR calc Af Amer: >60 mL/min (ref >60)
GFR calc non Af Amer: >60 mL/min (ref >60)
Glucose, Bld: 83 mg/dL (ref 65–99)
Potassium: 3.9 mmol/L (ref 3.5–5.1)
Sodium: 140 mmol/L (ref 135–145)

## 2015-01-29 LAB — CBC
HCT: 37 % (ref 36.0–46.0)
Hemoglobin: 11.8 g/dL — ABNORMAL LOW (ref 12.0–15.0)
MCH: 30 pg (ref 26.0–34.0)
MCHC: 31.9 g/dL (ref 30.0–36.0)
MCV: 94.1 fL (ref 78.0–100.0)
Platelets: 234 10*3/uL (ref 150–400)
RBC: 3.93 MIL/uL (ref 3.87–5.11)
RDW: 14.2 % (ref 11.5–15.5)
WBC: 5.5 10*3/uL (ref 4.0–10.5)

## 2015-01-29 LAB — PROTIME-INR
INR: 1.01 (ref 0.00–1.49)
Prothrombin Time: 13.5 seconds (ref 11.6–15.2)

## 2015-01-29 LAB — APTT: aPTT: 20 seconds — ABNORMAL LOW (ref 24–37)

## 2015-01-29 MED ORDER — FENTANYL CITRATE (PF) 100 MCG/2ML IJ SOLN
INTRAMUSCULAR | Status: AC | PRN
  Administered 2015-01-29 (×2): 25 ug via INTRAVENOUS

## 2015-01-29 MED ORDER — LIDOCAINE HCL 1 % IJ SOLN
INTRAMUSCULAR | Status: AC
  Filled 2015-01-29: qty 20

## 2015-01-29 MED ORDER — HEPARIN SOD (PORK) LOCK FLUSH 100 UNIT/ML IV SOLN
INTRAVENOUS | Status: AC
  Filled 2015-01-29: qty 15

## 2015-01-29 MED ORDER — SODIUM CHLORIDE 0.9 % IV SOLN: Freq: Once | INTRAVENOUS | Status: DC

## 2015-01-29 MED ORDER — HEPARIN SODIUM (PORCINE) 1000 UNIT/ML IJ SOLN
INTRAMUSCULAR | Status: AC | PRN
  Administered 2015-01-29: 1000 [IU] via INTRAVENOUS

## 2015-01-29 MED ORDER — FENTANYL CITRATE (PF) 100 MCG/2ML IJ SOLN: 25.0000 ug | Freq: Once | INTRAMUSCULAR | Status: DC

## 2015-01-29 MED ORDER — MIDAZOLAM HCL 2 MG/2ML IJ SOLN
INTRAMUSCULAR | Status: AC
  Filled 2015-01-29: qty 2

## 2015-01-29 MED ORDER — FENTANYL CITRATE (PF) 100 MCG/2ML IJ SOLN
INTRAMUSCULAR | Status: AC
  Filled 2015-01-29: qty 2

## 2015-01-29 MED ORDER — IOHEXOL 300 MG/ML  SOLN
150.0000 mL | Freq: Once | INTRAMUSCULAR | Status: AC | PRN
  Administered 2015-01-29: 60 mL via INTRA_ARTERIAL

## 2015-01-29 MED ORDER — SODIUM CHLORIDE 0.9 % IV SOLN: INTRAVENOUS | Status: AC

## 2015-01-29 MED ORDER — MIDAZOLAM HCL 2 MG/2ML IJ SOLN
INTRAMUSCULAR | Status: AC | PRN
  Administered 2015-01-29: 1 mg via INTRAVENOUS

## 2015-01-29 NOTE — Discharge Instructions (Signed)

## 2015-01-29 NOTE — Procedures (Signed)
S/P 4 vessel cerebral arteriogram. RT CFA approach. Findings. 1approx .19mmx 1.4 mm Lt PICA aneurysm 2.approx 2.47mm x 2.22mm Lt pericallosal aneurysm

## 2015-01-29 NOTE — Consult Note (Signed)
Chief Complaint: L PICA aneurysm pipeline stent 08/2013  Referring Physician(s): Dr Leonie Man   History of Present Illness: [name] is a 59 y.o. female   CVA 2011 Pt with known L posterior inferior cerebral artery aneurysm pipeline stent placed 08/2013 in IR Followed by Dr Estanislado Pandy Most recent angio 04/2014 shows decrease in size of aneurysm; stent stable Now for cerebral arteriogram recheck Pt states she does feel as if Rt eye droop has worsened in last several months Lt sided jaw; arm and leg staring to note some numbness Sleeping well; eating well; no longer in rehab    Past Medical History  Diagnosis Date  . Asthma   . COPD (chronic obstructive pulmonary disease)      - PFTs  01/24/06 FEV1 69% ratio 68% diffusing capacity 57% with no improvement after B2   - PFT's 10/1/ 09 FEV1 77   ratio  76    dlco                   45            no resp to B2    - Nl alpha one antitripsin level 10/09  . Hyperlipidemia   . Aneurysm   . Depression   . Anxiety   . Headache(784.0)   . Hypertension   . Hypothyroidism   . Pneumonia     hx  . Stroke 2011    rt sided weakness  . Anemia     hx  . History of kidney stones   . Seizure disorder, secondary     2-3 focal siezures monthly  . Seizures   . Short-term memory loss     since stroke  . TMJ (temporomandibular joint disorder)   . Increased urinary frequency   . Hammer toe   . Benign hypertensive heart disease   . Hemiparesis affecting dominant side as late effect of cerebrovascular accident   . Pure hypercholesterolemia   . Obesity   . Vitamin D deficiency   . Recurrent major depressive episodes   . Chest pain   . Difficulty speaking   . Malaise and fatigue   . Atrophic vaginitis   . Enthesopathy of elbow region   . GERD (gastroesophageal reflux disease)   . Cardiomegaly   . Urinary tract infectious disease   . Disorder of nasal cavity   . Abnormal gait   . Amnesia   . Low compliance bladder   .  Dyspnea   . Vertigo as late effect of stroke   . Skin sensation disturbance   . Nonruptured cerebral aneurysm   . Cough   . Swelling of limb     Past Surgical History  Procedure Laterality Date  . Laparoscopic hysterectomy    . Mandible fracture surgery    . Back surgery    . Cerebral angiogram      x3  . Stones      no surgery  . Radiology with anesthesia N/A 08/21/2013    Procedure: RADIOLOGY WITH ANESTHESIA;  Surgeon: Rob Hickman, MD;  Location: [place];  Service: Radiology;  Laterality: N/A;    Allergies: Phenytoin  Medications: Prior to Admission medications   Medication Sig Start Date End Date Taking? Authorizing Provider  ALPRAZolam (XANAX) 0.25 MG tablet Take 0.25 mg by mouth 2 (two) times daily as needed for anxiety or sleep.    Yes Historical Provider, MD  B Complex-C (SUPER B COMPLEX PO) Take 1 tablet by  mouth daily.   Yes Historical Provider, MD  Biotin w/ Vitamins C & E (HAIR SKIN & NAILS GUMMIES PO) Take 1 capsule by mouth 2 (two) times daily.   Yes Historical Provider, MD  Calcium Carb-Cholecalciferol (CALCIUM 500 +D) 500-400 MG-UNIT TABS Take 1 tablet by mouth daily.   Yes Historical Provider, MD  Cholecalciferol (VITAMIN D) 2000 UNITS CAPS Take 2,000 Units by mouth 2 (two) times daily.   Yes Historical Provider, MD  citalopram (CELEXA) 40 MG tablet Take 40 mg by mouth at bedtime.   Yes Historical Provider, MD  clindamycin (CLEOCIN T) 1 % external solution Apply 1 application topically 2 (two) times daily. Apply to face 06/06/13  Yes Historical Provider, MD  clopidogrel (PLAVIX) 75 MG tablet Take 75 mg by mouth daily with breakfast.   Yes Historical Provider, MD  Cranberry-Cholecalciferol (SUPER CRANBERRY/VITAMIN D3 PO) Take 1 tablet by mouth 2 (two) times daily.    Yes Historical Provider, MD  Cyanocobalamin (VITAMIN B-12 PO) Take 1,000 mcg by mouth every morning.   Yes Historical Provider, MD  cyclobenzaprine (FLEXERIL) 10 MG tablet Take 10 mg by mouth at  bedtime.    Yes Historical Provider, MD  diclofenac sodium (VOLTAREN) 1 % GEL Apply 2 g topically 3 (three) times daily as needed. For pain   Yes Historical Provider, MD  famotidine (PEPCID) 20 MG tablet Take 20 mg by mouth 2 (two) times daily.    Yes Historical Provider, MD  levETIRAcetam (KEPPRA) 500 MG tablet Take 1 tablet (500 mg total) by mouth every 12 (twelve) hours. 09/19/13  Yes Dennie Bible, NP  levothyroxine (SYNTHROID, LEVOTHROID) 50 MCG tablet Take 50 mcg by mouth daily before breakfast.  03/08/13  Yes Historical Provider, MD  losartan (COZAAR) 100 MG tablet Take 100 mg by mouth every morning.    Yes Historical Provider, MD  lubiprostone (AMITIZA) 24 MCG capsule Take 24 mcg by mouth 2 (two) times daily with a meal.   Yes Historical Provider, MD  rosuvastatin (CRESTOR) 10 MG tablet Take 10 mg by mouth at bedtime.    Yes Historical Provider, MD  topiramate (TOPAMAX) 25 MG tablet Take 50 mg by mouth 2 (two) times daily. 09/22/13  Yes Garvin Fila, MD     Family History  Problem Relation Age of Onset  . Heart disease Father   . Heart attack Father   . Lung cancer    . Heart disease Mother     History   Social History  . Marital Status: Married    Spouse Name: Jori Moll  . Number of Children: 2  . Years of Education: 14   Occupational History  . Disabled-former accountant    Social History Main Topics  . Smoking status: Former Smoker    Quit date: 09/05/2008  . Smokeless tobacco: Never Used  . Alcohol Use: No     Comment: patient quit alcohol use 2005.   . Drug Use: No  . Sexual Activity: Not on file   Other Topics Concern  . None   Social History Narrative   Patient lives at home with her husband Jori Moll).   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    Patient is on disability.    Patient has an Associates degree.   Patient is right-handed.   Patient drinks 2 cups of green tea most days.  Review  of Systems: A 12 point ROS discussed and pertinent positives are indicated in the HPI above.  All other systems are negative.  Review of Systems  Constitutional: Negative for fever, activity change and appetite change.  HENT: Negative for hearing loss, sore throat, tinnitus and trouble swallowing.   Respiratory: Negative for shortness of breath.   Cardiovascular: Negative for chest pain.  Genitourinary: Negative for difficulty urinating.  Neurological: Positive for facial asymmetry and numbness. Negative for dizziness, tremors, seizures, syncope, speech difficulty, weakness, light-headedness and headaches.  Psychiatric/Behavioral: Negative for behavioral problems and confusion.    Vital Signs: BP 100/60 mmHg  Pulse 77  Temp(Src) 97.7 F (36.5 C)  Resp 20  Ht 5\' 2"  (1.575 m)  Wt 64.864 kg (143 lb)  BMI 26.15 kg/m2  SpO2 98%  Physical Exam  Constitutional: She is oriented to person, place, and time. She appears well-nourished.  Cardiovascular: Normal rate, regular rhythm and normal heart sounds.   No murmur heard. Pulmonary/Chest: Effort normal and breath sounds normal. She has no wheezes.  Abdominal: Soft. Bowel sounds are normal. There is no tenderness.  Musculoskeletal: Normal range of motion. She exhibits no tenderness.  Neurological: She is alert and oriented to person, place, and time.  Skin: Skin is warm and dry.  Psychiatric: She has a normal mood and affect. Her behavior is normal. Judgment and thought content normal.  Nursing note and vitals reviewed.   Mallampati Score:  MD Evaluation Airway: WNL Heart: WNL Abdomen: WNL Chest/ Lungs: WNL ASA  Classification: 3 Mallampati/Airway Score: One  Imaging: No results found.  Labs:  CBC:  Recent Labs  04/11/14 0721  WBC 6.0  HGB 12.6  HCT 38.4  PLT 190    COAGS:  Recent Labs  04/11/14 0721  INR 1.00    BMP:  Recent Labs  02/07/14 0832 04/11/14 0721  NA 140 144  K 4.0 4.3  CL 108 109  CO2  26 22  GLUCOSE 75 65*  BUN 18 18  CALCIUM 9.1 9.2  CREATININE 0.6 0.70  GFRNONAA  --  >90  GFRAA  --  >90    LIVER FUNCTION TESTS: No results for input(s): BILITOT, AST, ALT, ALKPHOS, PROT, ALBUMIN in the last 8760 hours.  TUMOR MARKERS: No results for input(s): AFPTM, CEA, CA199, CHROMGRNA in the last 8760 hours.  Assessment and Plan:  L PICA aneurysm pipeline stent placed 08/2013 Complains of worsening Rt eye lid droop New L sided numbness Now for recheck cerebral arteriogram Risks and Benefits discussed with the patient including, but not limited to bleeding, infection, vascular injury, contrast induced renal failure, stroke or even death. All of the patient's questions were answered, patient is agreeable to proceed. Consent signed and in chart.  Thank you for this interesting consult.  I greatly enjoyed meeting [name] and look forward to participating in their care.  Signed: Eleora Sutherland A 01/29/2015, 8:16 AM   I spent a total of  20 Minutes   in face to face in clinical consultation, greater than 50% of which was counseling/coordinating care for cerebral arteriogram

## 2015-02-05 DIAGNOSIS — E039 Hypothyroidism, unspecified: Secondary | ICD-10-CM | POA: Diagnosis not present

## 2015-05-05 ENCOUNTER — Ambulatory Visit (INDEPENDENT_AMBULATORY_CARE_PROVIDER_SITE_OTHER): Payer: Medicare Other | Admitting: Nurse Practitioner

## 2015-05-05 ENCOUNTER — Encounter: Payer: Self-pay | Admitting: Nurse Practitioner

## 2015-05-05 VITALS — BP 107/73 | HR 75 | Ht 62.0 in | Wt 156.4 lb

## 2015-05-05 DIAGNOSIS — R269 Unspecified abnormalities of gait and mobility: Secondary | ICD-10-CM | POA: Diagnosis not present

## 2015-05-05 DIAGNOSIS — R5601 Complex febrile convulsions: Secondary | ICD-10-CM

## 2015-05-05 DIAGNOSIS — I619 Nontraumatic intracerebral hemorrhage, unspecified: Secondary | ICD-10-CM

## 2015-05-05 DIAGNOSIS — M21371 Foot drop, right foot: Secondary | ICD-10-CM | POA: Diagnosis not present

## 2015-05-05 DIAGNOSIS — G441 Vascular headache, not elsewhere classified: Secondary | ICD-10-CM | POA: Diagnosis not present

## 2015-05-05 MED ORDER — LEVETIRACETAM 500 MG PO TABS: 500.0000 mg | ORAL_TABLET | Freq: Two times a day (BID) | ORAL | Status: DC

## 2015-05-05 MED ORDER — TOPIRAMATE 25 MG PO TABS: 25.0000 mg | ORAL_TABLET | Freq: Two times a day (BID) | ORAL | Status: DC

## 2015-05-05 NOTE — Patient Instructions (Signed)
Continue Keppra at current dose Continue Topamax 25 mg in the a.m. 50 mg in the p.m. Memory score is stable Use cane at all times for gait abnormality at high risk for falls due to foot drop. Follow-up in 6-8 months

## 2015-05-05 NOTE — Progress Notes (Signed)
[facility]  PATIENT: [name] DOB: Jan 30, 1956   REASON FOR VISIT: Follow-up for vascular headache, history of intercerebral hemorrhage, mild cognitive impairment, history of seizures HISTORY FROM: Patient and husband    HISTORY OF PRESENT ILLNESS:[name], 59 year old female returns for followup. She was last seen in our office 09/19/13. 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 She states her headaches are doing well on Topamax  75 mg daily. Her tremors were doing well,not  bothersome She remains on Keppra She has not had any further seizures. She does complain of occasional fluttering of both her eyes but this is transient and is not similar to her prior seizures. She continues to have right-sided weakness and gait difficulty and incoordination is unchanged. She walks with a single-point cane. She is no longer using her TENS unit as the battery needs replacing. She is also on aspirin and Plavix and  CT angio is followed by Dr. Britt Bottom  She returns for routine followup.   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  She returns for followup after last visit on 09/20/12. She continues to have headaches several times a week , she does not describe them as disabling.. She remains on Topamax 25mg  tab, total dose of 75mg  daily, is tolerating well without side effects but complains of mild memory loss. She underwent cerebral catheter angiogram on 10//13 by Dr.Deveshwar which showed stable appearance of  pericallosal and left P2/P3 segment aneurysms. She states some mild tremor in the hand continues and it is not disabling.She complains of persistent mild right-sided weakness as well as some temperature sensitivity and sensation of coldness of the right foot ever since her stroke in 2011.She does feel her AFO tens unit is effective. She has fallen recently.    REVIEW OF SYSTEMS: Full 14 system review of systems performed and notable only for those listed, all others are neg:  Constitutional: neg  Cardiovascular: neg Ear/Nose/Throat: neg  Skin: neg Eyes: Blurred vision Respiratory: Cough Gastroitestinal: Urinary frequency Hematology/Lymphatic: Easy bruising  Endocrine: Excessive thirst Musculoskeletal: Difficulty walking Allergy/Immunology: neg Neurological: Memory loss Psychiatric: Decreased concentration , anxiety Sleep : neg   ALLERGIES: Allergies  Allergen Reactions  . Phenytoin Anaphylaxis, Hives and Swelling    HOME MEDICATIONS: Outpatient Prescriptions Prior to Visit  Medication Sig Dispense Refill  . ALPRAZolam (XANAX) 0.25 MG tablet Take 0.25 mg by mouth 2 (two) times daily as needed for anxiety or sleep.     . B Complex-C (SUPER B COMPLEX PO) Take 1 tablet by mouth daily.    . Biotin w/ Vitamins C & E (HAIR SKIN & NAILS GUMMIES PO) Take 1 capsule by mouth 2 (two) times daily.    . Calcium Carb-Cholecalciferol (CALCIUM 500 +D) 500-400 MG-UNIT TABS Take 1 tablet by mouth daily.    . Cholecalciferol (VITAMIN D) 2000 UNITS CAPS Take 2,000 Units by mouth 2 (two) times daily.    . citalopram (CELEXA) 40 MG tablet Take 40 mg by mouth at bedtime.    . clindamycin (CLEOCIN T) 1 % external solution Apply 1 application topically 2 (two) times daily. Apply to face    .  clopidogrel (PLAVIX) 75 MG tablet Take 75 mg by mouth daily with breakfast.    . Cranberry-Cholecalciferol (SUPER CRANBERRY/VITAMIN D3 PO) Take 1 tablet by mouth 2 (two) times daily.     . Cyanocobalamin (VITAMIN  B-12 PO) Take 1,000 mcg by mouth every morning.    . cyclobenzaprine (FLEXERIL) 10 MG tablet Take 10 mg by mouth at bedtime.     . diclofenac sodium (VOLTAREN) 1 % GEL Apply 2 g topically 3 (three) times daily as needed. For pain    . famotidine (PEPCID) 20 MG tablet Take 20 mg by mouth 2 (two) times daily.     Marland Kitchen levETIRAcetam (KEPPRA) 500 MG tablet Take 1 tablet (500 mg total) by mouth every 12 (twelve) hours. 60 tablet 6  . levothyroxine (SYNTHROID, LEVOTHROID) 50 MCG tablet Take 50 mcg by mouth daily before breakfast.     . losartan (COZAAR) 100 MG tablet Take 100 mg by mouth every morning.     . lubiprostone (AMITIZA) 24 MCG capsule Take 24 mcg by mouth 2 (two) times daily with a meal.    . rosuvastatin (CRESTOR) 10 MG tablet Take 10 mg by mouth at bedtime.     . topiramate (TOPAMAX) 25 MG tablet Take 50 mg by mouth 2 (two) times daily.     No facility-administered medications prior to visit.    PAST MEDICAL HISTORY: Past Medical History  Diagnosis Date  . Asthma   . COPD (chronic obstructive pulmonary disease)      - PFTs  01/24/06 FEV1 69% ratio 68% diffusing capacity 57% with no improvement after B2   - PFT's 10/1/ 09 FEV1 77   ratio  76    dlco                   45            no resp to B2    - Nl alpha one antitripsin level 10/09  . Hyperlipidemia   . Aneurysm   . Depression   . Anxiety   . Headache(784.0)   . Hypertension   . Hypothyroidism   . Pneumonia     hx  . Stroke 2011    rt sided weakness  . Anemia     hx  . History of kidney stones   . Seizure disorder, secondary     2-3 focal siezures monthly  . Seizures   . Short-term memory loss     since stroke  . TMJ (temporomandibular joint disorder)   . Increased urinary frequency   . Hammer toe   . Benign hypertensive heart disease   . Hemiparesis affecting dominant side as late effect of cerebrovascular accident   . Pure hypercholesterolemia   . Obesity   . Vitamin D deficiency   . Recurrent major depressive  episodes   . Chest pain   . Difficulty speaking   . Malaise and fatigue   . Atrophic vaginitis   . Enthesopathy of elbow region   . GERD (gastroesophageal reflux disease)   . Cardiomegaly   . Urinary tract infectious disease   . Disorder of nasal cavity   . Abnormal gait   . Amnesia   . Low compliance bladder   . Dyspnea   . Vertigo as late effect of stroke   . Skin sensation disturbance   . Nonruptured cerebral aneurysm   . Cough   . Swelling of limb     PAST SURGICAL HISTORY: Past Surgical History  Procedure Laterality  Date  . Laparoscopic hysterectomy    . Mandible fracture surgery    . Back surgery    . Cerebral angiogram      x3  . Stones      no surgery  . Radiology with anesthesia N/A 08/21/2013    Procedure: RADIOLOGY WITH ANESTHESIA;  Surgeon: Rob Hickman, MD;  Location: [facility];  Service: Radiology;  Laterality: N/A;    FAMILY HISTORY: Family History  Problem Relation Age of Onset  . Heart disease Father   . Heart attack Father   . Lung cancer    . Heart disease Mother     SOCIAL HISTORY: Social History   Social History  . Marital Status: Married    Spouse Name: Jori Moll  . Number of Children: 2  . Years of Education: 14   Occupational History  . Disabled-former accountant    Social History Main Topics  . Smoking status: Former Smoker    Quit date: 09/05/2008  . Smokeless tobacco: Never Used  . Alcohol Use: No     Comment: patient quit alcohol use 2005.   . Drug Use: No  . Sexual Activity: Not on file   Other Topics Concern  . Not on file   Social History Narrative   Patient lives at home with her husband Jori Moll).   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    Patient is on disability.    Patient has an Associates degree.   Patient is right-handed.   Patient drinks 2 cups of green tea most days.                 PHYSICAL EXAM  Filed Vitals:   05/05/15 1040  BP: 107/73  Pulse:  75  Height: 5\' 2"  (1.575 m)  Weight: 156 lb 6.4 oz (70.943 kg)   Body mass index is 28.6 kg/(m^2). Generalized: Well developed, in no acute distress  Head: normocephalic and atraumatic,.  Neck: Supple, no carotid bruits  Cardiac: Regular rate rhythm, no murmur  Neurological examination  Mentation: Alert oriented to time, place, history taking. Follows all commands speech and language fluent mini-mental status exam 26/30 with deficits in orientation and recall.AFT 16. Clock Drawing 4/4. Cranial nerve II-XII: Fundi not visualized.Pupils were equal round reactive to light extraocular movements were full, visual field were full on confrontational test. Facial is asymmetric with right lower face weakness . Hearing was intact to finger rubbing bilaterally. Uvula tongue midline. head turning and shoulder shrug were normal and symmetric.Tongue protrusion into cheek strength was normal.  Motor: Mild 4/5 right-sided hemiparesis with distal more than proximal weakness. Increased tone on the right upper and lower extremity. Diminished fine finger movements on the right . Right foot drop not wearing TENS brace Sensory: Touch and pinprick are normal on the left decreased on the right, vibratory is decreased to the right ankle  Coordination: Impaired on the right  Reflexes: Brachioradialis 2/2, biceps 2/2, triceps 2/2, patellar 2/2, Achilles 1/1, plantar responses were flexor bilaterally.  Gait and Station: Rising up from seated position without assistance, wide based , right foot drop, using single-point cane, unsteady gait     DIAGNOSTIC DATA (LABS, IMAGING, TESTING) - I reviewed patient records, labs, notes, testing and imaging myself where available.  Lab Results  Component Value Date   WBC 5.5 01/29/2015   HGB 11.8* 01/29/2015   HCT 37.0 01/29/2015   MCV 94.1 01/29/2015   PLT 234  01/29/2015      Component Value Date/Time   NA 140 01/29/2015 0801   K 3.9 01/29/2015 0801   CL 110  01/29/2015 0801   CO2 19* 01/29/2015 0801   GLUCOSE 83 01/29/2015 0801   BUN 17 01/29/2015 0801   CREATININE 0.68 01/29/2015 0801   CALCIUM 8.8* 01/29/2015 0801   PROT 8.0 12/04/2013 1801   ALBUMIN 3.9 12/04/2013 1801   AST 43* 12/04/2013 1801   ALT 30 12/04/2013 1801   ALKPHOS 101 12/04/2013 1801   BILITOT 0.4 12/04/2013 1801   GFRNONAA >60 01/29/2015 0801   GFRAA >60 01/29/2015 0801     ASSESSMENT AND PLAN 59 y.o. year old female has a past medical history of left frontoparietal parenchymal hemorrhage March 2011 from PRES and malignant hypertension. Chronic headaches with history of migraines.Placement of pipeline stent across Left PICA aneurysm by Dr. Estanislado Pandy on 08/21/2013. History of seizure disorder . Mild cognitive impairment  Continue Keppra at current dose Continue Topamax 25 mg in the a.m. 50 mg in the p.m. Memory score is stable Use cane at all times for gait abnormality at high risk for falls due to foot drop. Need to get TENS unit fixed and wear when ambulating due to high fall risk  Follow-up in 6-8 months  Dennie Bible, [facility], [facility], APRN  [facility] [address], [place], [place] [address] [phone]

## 2015-05-08 NOTE — Progress Notes (Signed)
I agree with the above plan 

## 2015-05-28 ENCOUNTER — Emergency Department (HOSPITAL_COMMUNITY): Payer: Medicare Other

## 2015-05-28 ENCOUNTER — Emergency Department (HOSPITAL_COMMUNITY)
Admission: EM | Admit: 2015-05-28 | Discharge: 2015-05-28 | Disposition: A | Discharge status: Home / Self Care | Payer: Medicare Other | Attending: Emergency Medicine | Admitting: Emergency Medicine

## 2015-05-28 ENCOUNTER — Encounter (HOSPITAL_COMMUNITY): Payer: Self-pay | Admitting: Emergency Medicine

## 2015-05-28 DIAGNOSIS — S0990XA Unspecified injury of head, initial encounter: Secondary | ICD-10-CM | POA: Diagnosis present

## 2015-05-28 DIAGNOSIS — Z87891 Personal history of nicotine dependence: Secondary | ICD-10-CM | POA: Diagnosis not present

## 2015-05-28 DIAGNOSIS — W1839XA Other fall on same level, initial encounter: Secondary | ICD-10-CM | POA: Insufficient documentation

## 2015-05-28 DIAGNOSIS — Z87442 Personal history of urinary calculi: Secondary | ICD-10-CM | POA: Insufficient documentation

## 2015-05-28 DIAGNOSIS — D691 Qualitative platelet defects: Secondary | ICD-10-CM | POA: Insufficient documentation

## 2015-05-28 DIAGNOSIS — Z872 Personal history of diseases of the skin and subcutaneous tissue: Secondary | ICD-10-CM | POA: Insufficient documentation

## 2015-05-28 DIAGNOSIS — F419 Anxiety disorder, unspecified: Secondary | ICD-10-CM | POA: Insufficient documentation

## 2015-05-28 DIAGNOSIS — Z8673 Personal history of transient ischemic attack (TIA), and cerebral infarction without residual deficits: Secondary | ICD-10-CM | POA: Diagnosis not present

## 2015-05-28 DIAGNOSIS — Y92009 Unspecified place in unspecified non-institutional (private) residence as the place of occurrence of the external cause: Secondary | ICD-10-CM | POA: Diagnosis not present

## 2015-05-28 DIAGNOSIS — F329 Major depressive disorder, single episode, unspecified: Secondary | ICD-10-CM | POA: Diagnosis not present

## 2015-05-28 DIAGNOSIS — Y9389 Activity, other specified: Secondary | ICD-10-CM | POA: Insufficient documentation

## 2015-05-28 DIAGNOSIS — E669 Obesity, unspecified: Secondary | ICD-10-CM | POA: Insufficient documentation

## 2015-05-28 DIAGNOSIS — E559 Vitamin D deficiency, unspecified: Secondary | ICD-10-CM | POA: Diagnosis not present

## 2015-05-28 DIAGNOSIS — Z862 Personal history of diseases of the blood and blood-forming organs and certain disorders involving the immune mechanism: Secondary | ICD-10-CM | POA: Insufficient documentation

## 2015-05-28 DIAGNOSIS — E039 Hypothyroidism, unspecified: Secondary | ICD-10-CM | POA: Insufficient documentation

## 2015-05-28 DIAGNOSIS — S0083XA Contusion of other part of head, initial encounter: Secondary | ICD-10-CM | POA: Diagnosis not present

## 2015-05-28 DIAGNOSIS — Z7902 Long term (current) use of antithrombotics/antiplatelets: Secondary | ICD-10-CM | POA: Insufficient documentation

## 2015-05-28 DIAGNOSIS — Z8742 Personal history of other diseases of the female genital tract: Secondary | ICD-10-CM | POA: Diagnosis not present

## 2015-05-28 DIAGNOSIS — E780 Pure hypercholesterolemia: Secondary | ICD-10-CM | POA: Insufficient documentation

## 2015-05-28 DIAGNOSIS — Z8701 Personal history of pneumonia (recurrent): Secondary | ICD-10-CM | POA: Insufficient documentation

## 2015-05-28 DIAGNOSIS — Z8739 Personal history of other diseases of the musculoskeletal system and connective tissue: Secondary | ICD-10-CM | POA: Insufficient documentation

## 2015-05-28 DIAGNOSIS — Y998 Other external cause status: Secondary | ICD-10-CM | POA: Diagnosis not present

## 2015-05-28 DIAGNOSIS — J449 Chronic obstructive pulmonary disease, unspecified: Secondary | ICD-10-CM | POA: Insufficient documentation

## 2015-05-28 DIAGNOSIS — I10 Essential (primary) hypertension: Secondary | ICD-10-CM | POA: Insufficient documentation

## 2015-05-28 DIAGNOSIS — T45525A Adverse effect of antithrombotic drugs, initial encounter: Secondary | ICD-10-CM | POA: Diagnosis not present

## 2015-05-28 DIAGNOSIS — E785 Hyperlipidemia, unspecified: Secondary | ICD-10-CM | POA: Diagnosis not present

## 2015-05-28 DIAGNOSIS — W19XXXA Unspecified fall, initial encounter: Secondary | ICD-10-CM

## 2015-05-28 DIAGNOSIS — Z7982 Long term (current) use of aspirin: Secondary | ICD-10-CM | POA: Diagnosis not present

## 2015-05-28 DIAGNOSIS — G40909 Epilepsy, unspecified, not intractable, without status epilepticus: Secondary | ICD-10-CM | POA: Diagnosis not present

## 2015-05-28 DIAGNOSIS — Z79899 Other long term (current) drug therapy: Secondary | ICD-10-CM | POA: Insufficient documentation

## 2015-05-28 LAB — CBC WITH DIFFERENTIAL/PLATELET
Basophils Absolute: 0 10*3/uL (ref 0.0–0.1)
Basophils Relative: 0 %
Eosinophils Absolute: 0.3 10*3/uL (ref 0.0–0.7)
Eosinophils Relative: 5 %
HCT: 37.2 % (ref 36.0–46.0)
Hemoglobin: 11.8 g/dL — ABNORMAL LOW (ref 12.0–15.0)
Lymphocytes Relative: 29 %
Lymphs Abs: 1.5 10*3/uL (ref 0.7–4.0)
MCH: 30 pg (ref 26.0–34.0)
MCHC: 31.7 g/dL (ref 30.0–36.0)
MCV: 94.7 fL (ref 78.0–100.0)
Monocytes Absolute: 0.4 10*3/uL (ref 0.1–1.0)
Monocytes Relative: 7 %
Neutro Abs: 3.1 10*3/uL (ref 1.7–7.7)
Neutrophils Relative %: 59 %
Platelets: 182 10*3/uL (ref 150–400)
RBC: 3.93 MIL/uL (ref 3.87–5.11)
RDW: 13.8 % (ref 11.5–15.5)
WBC: 5.3 10*3/uL (ref 4.0–10.5)

## 2015-05-28 LAB — COMPREHENSIVE METABOLIC PANEL
ALT: 17 U/L (ref 14–54)
AST: 23 U/L (ref 15–41)
Albumin: 3.4 g/dL — ABNORMAL LOW (ref 3.5–5.0)
Alkaline Phosphatase: 76 U/L (ref 38–126)
Anion gap: 7 (ref 5–15)
BUN: 12 mg/dL (ref 6–20)
CO2: 26 mmol/L (ref 22–32)
Calcium: 9.2 mg/dL (ref 8.9–10.3)
Chloride: 110 mmol/L (ref 101–111)
Creatinine, Ser: 0.71 mg/dL (ref 0.44–1.00)
GFR calc Af Amer: >60 mL/min (ref >60)
GFR calc non Af Amer: >60 mL/min (ref >60)
Glucose, Bld: 94 mg/dL (ref 65–99)
Potassium: 4.7 mmol/L (ref 3.5–5.1)
Sodium: 143 mmol/L (ref 135–145)
Total Bilirubin: 0.5 mg/dL (ref 0.3–1.2)
Total Protein: 6.1 g/dL — ABNORMAL LOW (ref 6.5–8.1)

## 2015-05-28 LAB — I-STAT TROPONIN, ED: Troponin i, poc: 0.01 ng/mL (ref 0.00–0.08)

## 2015-05-28 LAB — PROTIME-INR
INR: 1.04 (ref 0.00–1.49)
Prothrombin Time: 13.8 seconds (ref 11.6–15.2)

## 2015-05-28 LAB — APTT: aPTT: 29 seconds (ref 24–37)

## 2015-05-28 NOTE — Discharge Instructions (Signed)
Blunt Trauma °You have been evaluated for injuries. You have been examined and your caregiver has not found injuries serious enough to require hospitalization. °It is common to have multiple bruises and sore muscles following an accident. These tend to feel worse for the first 24 hours. You will feel more stiffness and soreness over the next several hours and worse when you wake up the first morning after your accident. After this point, you should begin to improve with each passing day. The amount of improvement depends on the amount of damage done in the accident. °Following your accident, if some part of your body does not work as it should, or if the pain in any area continues to increase, you should return to the Emergency Department for re-evaluation.  °HOME CARE INSTRUCTIONS  °Routine care for sore areas should include: °· Ice to sore areas every 2 hours for 20 minutes while awake for the next 2 days. °· Drink extra fluids (not alcohol). °· Take a hot or warm shower or bath once or twice a day to increase blood flow to sore muscles. This will help you "limber up". °· Activity as tolerated. Lifting may aggravate neck or back pain. °· Only take over-the-counter or prescription medicines for pain, discomfort, or fever as directed by your caregiver. Do not use aspirin. This may increase bruising or increase bleeding if there are small areas where this is happening. °SEEK IMMEDIATE MEDICAL CARE IF: °· Numbness, tingling, weakness, or problem with the use of your arms or legs. °· A severe headache is not relieved with medications. °· There is a change in bowel or bladder control. °· Increasing pain in any areas of the body. °· Short of breath or dizzy. °· Nauseated, vomiting, or sweating. °· Increasing belly (abdominal) discomfort. °· Blood in urine, stool, or vomiting blood. °· Pain in either shoulder in an area where a shoulder strap would be. °· Feelings of lightheadedness or if you have a fainting  episode. °Sometimes it is not possible to identify all injuries immediately after the trauma. It is important that you continue to monitor your condition after the emergency department visit. If you feel you are not improving, or improving more slowly than should be expected, call your physician. If you feel your symptoms (problems) are worsening, return to the Emergency Department immediately. °Document Released: 05/18/2001 Document Revised: 11/14/2011 Document Reviewed: 04/09/2008 °ExitCare® Patient Information ©2015 [employer]. This information is not intended to replace advice given to you by your health care provider. Make sure you discuss any questions you have with your health care provider. ° °

## 2015-05-28 NOTE — ED Notes (Signed)
Pt to ED due to recent fall on Tuesday, pt reports she takes Plavix. Pt has bruising around each orbit. Reports her head feels tight and she is nauseated. Has hx of stroke. Pt is a/o x4. Pt reports right sided weakness as deficit from previous stroke. Pt reports left numbness in arm that began a few months ago. No slurred speech. VSS.

## 2015-05-28 NOTE — ED Provider Notes (Signed)
CSN: [id number]     Arrival date & time 05/28/15  1044 History   First MD Initiated Contact with Patient 05/28/15 1057     Chief Complaint  Patient presents with  . Fall   (Consider location/radiation/quality/duration/timing/severity/associated sxs/prior Treatment) Patient is a 59 y.o. female presenting with fall. The history is provided by the patient and the spouse.  Fall This is a new problem. The current episode started 2 days ago. The problem occurs constantly. The problem has been gradually worsening. Associated symptoms include headaches (since falling). Nothing aggravates the symptoms. Nothing relieves the symptoms. She has tried nothing for the symptoms.    Past Medical History  Diagnosis Date  . Asthma   . COPD (chronic obstructive pulmonary disease)      - PFTs  01/24/06 FEV1 69% ratio 68% diffusing capacity 57% with no improvement after B2   - PFT's 10/1/ 09 FEV1 77   ratio  76    dlco                   45            no resp to B2    - Nl alpha one antitripsin level 10/09  . Hyperlipidemia   . Aneurysm   . Depression   . Anxiety   . Headache(784.0)   . Hypertension   . Hypothyroidism   . Pneumonia     hx  . Stroke 2011    rt sided weakness  . Anemia     hx  . History of kidney stones   . Seizure disorder, secondary     2-3 focal siezures monthly  . Seizures   . Short-term memory loss     since stroke  . TMJ (temporomandibular joint disorder)   . Increased urinary frequency   . Hammer toe   . Benign hypertensive heart disease   . Hemiparesis affecting dominant side as late effect of cerebrovascular accident   . Pure hypercholesterolemia   . Obesity   . Vitamin D deficiency   . Recurrent major depressive episodes   . Chest pain   . Difficulty speaking   . Malaise and fatigue   . Atrophic vaginitis   . Enthesopathy of elbow region   . GERD (gastroesophageal reflux disease)   . Cardiomegaly   . Urinary tract infectious disease   . Disorder of nasal cavity    . Abnormal gait   . Amnesia   . Low compliance bladder   . Dyspnea   . Vertigo as late effect of stroke   . Skin sensation disturbance   . Nonruptured cerebral aneurysm   . Cough   . Swelling of limb    Past Surgical History  Procedure Laterality Date  . Laparoscopic hysterectomy    . Mandible fracture surgery    . Back surgery    . Cerebral angiogram      x3  . Stones      no surgery  . Radiology with anesthesia N/A 08/21/2013    Procedure: RADIOLOGY WITH ANESTHESIA;  Surgeon: Rob Hickman, MD;  Location: [facility];  Service: Radiology;  Laterality: N/A;   Family History  Problem Relation Age of Onset  . Heart disease Father   . Heart attack Father   . Lung cancer    . Heart disease Mother    Social History  Substance Use Topics  . Smoking status: Former Smoker    Quit date: 09/05/2008  . Smokeless tobacco: Never Used  .  Alcohol Use: No     Comment: patient quit alcohol use 2005.    OB History    No data available     Review of Systems  Neurological: Positive for headaches (since falling).  All other systems reviewed and are negative.     Allergies  Phenytoin  Home Medications   Prior to Admission medications   Medication Sig Start Date End Date Taking? Authorizing Provider  ALPRAZolam (XANAX) 0.25 MG tablet Take 0.25 mg by mouth 2 (two) times daily as needed for anxiety or sleep.    Yes Historical Provider, MD  aspirin EC 81 MG tablet Take 81 mg by mouth daily.   Yes Historical Provider, MD  B Complex-C (SUPER B COMPLEX PO) Take 1 tablet by mouth daily.   Yes Historical Provider, MD  Cholecalciferol (VITAMIN D) 2000 UNITS CAPS Take 2,000 Units by mouth 2 (two) times daily.   Yes Historical Provider, MD  citalopram (CELEXA) 40 MG tablet Take 40 mg by mouth at bedtime.   Yes Historical Provider, MD  clindamycin (CLEOCIN T) 1 % external solution Apply 1 application topically 2 (two) times daily. Apply to face 06/06/13  Yes Historical Provider, MD    clopidogrel (PLAVIX) 75 MG tablet Take 75 mg by mouth daily with breakfast.   Yes Historical Provider, MD  Cranberry-Cholecalciferol (SUPER CRANBERRY/VITAMIN D3 PO) Take 1 tablet by mouth 2 (two) times daily.    Yes Historical Provider, MD  Cyanocobalamin (VITAMIN B-12 PO) Take 1,000 mcg by mouth every morning.   Yes Historical Provider, MD  cyclobenzaprine (FLEXERIL) 10 MG tablet Take 10 mg by mouth at bedtime.    Yes Historical Provider, MD  diclofenac sodium (VOLTAREN) 1 % GEL Apply 2 g topically 3 (three) times daily as needed. For pain   Yes Historical Provider, MD  famotidine (PEPCID) 20 MG tablet Take 20 mg by mouth 2 (two) times daily.    Yes Historical Provider, MD  levETIRAcetam (KEPPRA) 500 MG tablet Take 1 tablet (500 mg total) by mouth every 12 (twelve) hours. 05/05/15  Yes Dennie Bible, NP  levothyroxine (SYNTHROID, LEVOTHROID) 50 MCG tablet Take 50 mcg by mouth daily before breakfast.  03/08/13  Yes Historical Provider, MD  losartan (COZAAR) 100 MG tablet Take 100 mg by mouth every morning.    Yes Historical Provider, MD  Multiple Vitamins-Minerals (HAIR/SKIN/NAILS) TABS Take 1 tablet by mouth 2 (two) times daily.   Yes Historical Provider, MD  Omega-3 Fatty Acids (FISH OIL) 1000 MG CAPS Take 1,000 mg by mouth daily.   Yes Historical Provider, MD  rosuvastatin (CRESTOR) 10 MG tablet Take 10 mg by mouth at bedtime.    Yes Historical Provider, MD  topiramate (TOPAMAX) 25 MG tablet Take 1 tablet (25 mg total) by mouth 2 (two) times daily. 1 am and 2 pm Patient taking differently: Take 25-50 mg by mouth 2 (two) times daily. Take 1 tablet (25mg ) every morning, and 2 tablets (50 mg) every evening 05/05/15  Yes Dennie Bible, NP  aspirin 81 MG tablet Take 81 mg by mouth daily.    Historical Provider, MD  Biotin w/ Vitamins C & E (HAIR SKIN & NAILS GUMMIES PO) Take 1 capsule by mouth 2 (two) times daily.    Historical Provider, MD  Calcium Carb-Cholecalciferol (CALCIUM 500 +D)  500-400 MG-UNIT TABS Take 1 tablet by mouth daily.    Historical Provider, MD  lubiprostone (AMITIZA) 24 MCG capsule Take 24 mcg by mouth 2 (two) times daily with a  meal.    Historical Provider, MD   BP 108/69 mmHg  Pulse 73  Temp(Src) 98 F (36.7 C) (Oral)  Resp 17  SpO2 99% Physical Exam  Constitutional: She is oriented to person, place, and time. She appears well-developed and well-nourished. No distress.  HENT:  Head: Normocephalic. Head is with raccoon's eyes (with periorbital swelling and ecchymosis ) and with contusion.    Eyes: Conjunctivae are normal.  Neck: Neck supple. No tracheal deviation present.  Cardiovascular: Normal rate and regular rhythm.   Pulmonary/Chest: Effort normal. No respiratory distress.  Abdominal: Soft. She exhibits no distension.  Neurological: She is alert and oriented to person, place, and time. She has normal strength. No cranial nerve deficit. Coordination normal. GCS eye subscore is 4. GCS verbal subscore is 5. GCS motor subscore is 6.  4/5 right sided weakness  Skin: Skin is warm and dry.  Psychiatric: She has a normal mood and affect.    ED Course  Procedures (including critical care time) Labs Review Labs Reviewed  COMPREHENSIVE METABOLIC PANEL - Abnormal; Notable for the following:    Total Protein 6.1 (*)    Albumin 3.4 (*)    All other components within normal limits  CBC WITH DIFFERENTIAL/PLATELET - Abnormal; Notable for the following:    Hemoglobin 11.8 (*)    All other components within normal limits  PROTIME-INR  APTT  I-STAT TROPOININ, ED    Imaging Review Ct Head Wo Contrast  05/28/2015   CLINICAL DATA:  [place] [address] Tuesday , nausea, periorbital bruising. Remote stroke with residual right-sided weakness.  EXAM: CT HEAD WITHOUT CONTRAST  TECHNIQUE: Contiguous axial images were obtained from the base of the skull through the vertex without intravenous contrast.  COMPARISON:  11/21/2013  FINDINGS: Atherosclerotic and physiologic  intracranial calcifications. Basilar arterial stent. Chronic left parietal encephalomalacia with some ex vacuo dilatation of the left lateral ventricle. There is no evidence of acute intracranial hemorrhage, brain edema, mass lesion, acute infarction, mass effect, or midline shift. Acute infarct may be inapparent on noncontrast CT. No other intra-axial abnormalities are seen, and the ventricles and sulci are otherwise within normal limits in size and symmetry. No abnormal extra-axial fluid collections or masses are identified. No significant calvarial abnormality.  IMPRESSION: 1. Negative for bleed or other acute intracranial process. 2. Stable left parietal encephalomalacia.   Electronically Signed   By: Lucrezia Europe M.D.   On: 05/28/2015 11:41   I have personally reviewed and evaluated these images and lab results as part of my medical decision-making.   EKG Interpretation None      MDM   Final diagnoses:  Fall, initial encounter  Forehead contusion, initial encounter  Platelet inhibition due to Plavix   59 year old female presents with head injury after falling 2 days ago at home for unknown reasons. She has a history of remote stroke related to aneurysmal hemorrhage. Since the stroke she has had some right-sided deficits that have been persistent and multiple falls including head trauma striking the floor of her garage previously. CT had to evaluate for acute traumatic or aneurysmal bleeding.  Patient was referred to outpatient neurology clinic for further workup and management of continued falls. She has no acute findings on exam or workup today to suggest intracranial, metabolic, organic causes except for chronic stroke deficits. Plan to follow up with PCP as needed and return precautions discussed for worsening or new concerning symptoms.     Leo Grosser, MD 05/28/15 [phone]

## 2015-06-08 DIAGNOSIS — Z23 Encounter for immunization: Secondary | ICD-10-CM | POA: Diagnosis not present

## 2015-06-08 DIAGNOSIS — R7309 Other abnormal glucose: Secondary | ICD-10-CM | POA: Diagnosis not present

## 2015-06-08 DIAGNOSIS — E559 Vitamin D deficiency, unspecified: Secondary | ICD-10-CM | POA: Diagnosis not present

## 2015-06-08 DIAGNOSIS — I10 Essential (primary) hypertension: Secondary | ICD-10-CM | POA: Diagnosis not present

## 2015-06-08 DIAGNOSIS — Z0000 Encounter for general adult medical examination without abnormal findings: Secondary | ICD-10-CM | POA: Diagnosis not present

## 2015-06-08 DIAGNOSIS — E039 Hypothyroidism, unspecified: Secondary | ICD-10-CM | POA: Diagnosis not present

## 2015-07-09 ENCOUNTER — Telehealth (HOSPITAL_COMMUNITY): Payer: Self-pay

## 2015-07-09 NOTE — Telephone Encounter (Signed)
Called pt to schedule a f/u MRI, left message for pt to call back. AW

## 2015-07-28 ENCOUNTER — Other Ambulatory Visit (HOSPITAL_COMMUNITY): Payer: Self-pay | Admitting: Interventional Radiology

## 2015-07-28 DIAGNOSIS — I729 Aneurysm of unspecified site: Secondary | ICD-10-CM

## 2015-08-14 ENCOUNTER — Ambulatory Visit (HOSPITAL_COMMUNITY)
Admission: RE | Admit: 2015-08-14 | Discharge: 2015-08-14 | Disposition: A | Discharge status: Home / Self Care | Payer: Medicare Other | Source: Ambulatory Visit | Attending: Interventional Radiology | Admitting: Interventional Radiology

## 2015-08-14 ENCOUNTER — Ambulatory Visit (HOSPITAL_COMMUNITY): Payer: Medicare Other

## 2015-08-14 DIAGNOSIS — I619 Nontraumatic intracerebral hemorrhage, unspecified: Secondary | ICD-10-CM | POA: Diagnosis not present

## 2015-08-14 DIAGNOSIS — I671 Cerebral aneurysm, nonruptured: Secondary | ICD-10-CM | POA: Diagnosis not present

## 2015-08-14 DIAGNOSIS — G9389 Other specified disorders of brain: Secondary | ICD-10-CM | POA: Diagnosis not present

## 2015-08-14 DIAGNOSIS — I729 Aneurysm of unspecified site: Secondary | ICD-10-CM

## 2015-08-14 LAB — CREATININE, SERUM
Creatinine, Ser: 1 mg/dL (ref 0.44–1.00)
GFR calc Af Amer: >60 mL/min (ref >60)
GFR calc non Af Amer: >60 mL/min (ref >60)

## 2015-08-14 MED ORDER — GADOBENATE DIMEGLUMINE 529 MG/ML IV SOLN
15.0000 mL | Freq: Once | INTRAVENOUS | Status: AC | PRN
  Administered 2015-08-14: 15 mL via INTRAVENOUS

## 2015-09-18 DIAGNOSIS — H524 Presbyopia: Secondary | ICD-10-CM | POA: Diagnosis not present

## 2015-09-18 DIAGNOSIS — H353131 Nonexudative age-related macular degeneration, bilateral, early dry stage: Secondary | ICD-10-CM | POA: Diagnosis not present

## 2015-09-18 DIAGNOSIS — H2513 Age-related nuclear cataract, bilateral: Secondary | ICD-10-CM | POA: Diagnosis not present

## 2015-09-18 DIAGNOSIS — H52223 Regular astigmatism, bilateral: Secondary | ICD-10-CM | POA: Diagnosis not present

## 2015-10-12 DIAGNOSIS — D511 Vitamin B12 deficiency anemia due to selective vitamin B12 malabsorption with proteinuria: Secondary | ICD-10-CM | POA: Diagnosis not present

## 2015-10-12 DIAGNOSIS — R413 Other amnesia: Secondary | ICD-10-CM | POA: Diagnosis not present

## 2015-10-12 DIAGNOSIS — I10 Essential (primary) hypertension: Secondary | ICD-10-CM | POA: Diagnosis not present

## 2015-10-12 DIAGNOSIS — R7309 Other abnormal glucose: Secondary | ICD-10-CM | POA: Diagnosis not present

## 2015-10-12 DIAGNOSIS — R079 Chest pain, unspecified: Secondary | ICD-10-CM | POA: Diagnosis not present

## 2015-10-12 DIAGNOSIS — D649 Anemia, unspecified: Secondary | ICD-10-CM | POA: Diagnosis not present

## 2015-10-20 ENCOUNTER — Other Ambulatory Visit: Payer: Self-pay | Admitting: Internal Medicine

## 2015-10-20 ENCOUNTER — Ambulatory Visit
Admission: RE | Admit: 2015-10-20 | Discharge: 2015-10-20 | Disposition: A | Discharge status: Home / Self Care | Payer: Medicare Other | Source: Ambulatory Visit | Attending: Internal Medicine | Admitting: Internal Medicine

## 2015-10-20 DIAGNOSIS — R0789 Other chest pain: Secondary | ICD-10-CM

## 2015-10-20 DIAGNOSIS — R079 Chest pain, unspecified: Secondary | ICD-10-CM | POA: Diagnosis not present

## 2015-10-21 ENCOUNTER — Other Ambulatory Visit: Payer: Self-pay | Admitting: Nurse Practitioner

## 2015-10-21 DIAGNOSIS — L821 Other seborrheic keratosis: Secondary | ICD-10-CM | POA: Diagnosis not present

## 2015-10-21 DIAGNOSIS — L570 Actinic keratosis: Secondary | ICD-10-CM | POA: Diagnosis not present

## 2015-10-21 DIAGNOSIS — L728 Other follicular cysts of the skin and subcutaneous tissue: Secondary | ICD-10-CM | POA: Diagnosis not present

## 2015-11-03 ENCOUNTER — Ambulatory Visit (INDEPENDENT_AMBULATORY_CARE_PROVIDER_SITE_OTHER): Payer: Medicare Other | Admitting: Nurse Practitioner

## 2015-11-03 ENCOUNTER — Encounter: Payer: Self-pay | Admitting: Nurse Practitioner

## 2015-11-03 VITALS — BP 109/75 | HR 85 | Ht 62.0 in | Wt 157.6 lb

## 2015-11-03 DIAGNOSIS — I619 Nontraumatic intracerebral hemorrhage, unspecified: Secondary | ICD-10-CM | POA: Diagnosis not present

## 2015-11-03 DIAGNOSIS — M21371 Foot drop, right foot: Secondary | ICD-10-CM | POA: Diagnosis not present

## 2015-11-03 DIAGNOSIS — R5601 Complex febrile convulsions: Secondary | ICD-10-CM

## 2015-11-03 DIAGNOSIS — Z5181 Encounter for therapeutic drug level monitoring: Secondary | ICD-10-CM

## 2015-11-03 DIAGNOSIS — R269 Unspecified abnormalities of gait and mobility: Secondary | ICD-10-CM

## 2015-11-03 MED ORDER — LEVETIRACETAM 500 MG PO TABS: 500.0000 mg | ORAL_TABLET | Freq: Two times a day (BID) | ORAL | Status: DC

## 2015-11-03 NOTE — Patient Instructions (Signed)
Continue Keppra at current dose check level in the morning before am dose Decrease  Topamax by 25 mg weekly until off the drug. Memory score is stable Use cane at all times for gait abnormality at high risk for falls due to foot drop. Need to get TENS unit fixed and wear when ambulating due to high fall risk  Follow-up in 8 months next with [name]

## 2015-11-03 NOTE — Progress Notes (Signed)
[facility]  PATIENT: [name] DOB: 02/08/1956   REASON FOR VISIT: Follow-up for nontraumatic intracerebral hemorrhage, abnormality of gait, vascular headache, right foot drop complex partial seizures, mild cognitive impairment HISTORY FROM: Patient    HISTORY OF PRESENT ILLNESS:2/28/17[name], 60 year old female returns for followup. She was last seen in our office 05/05/15. 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 She states her headaches are doing well on Topamax 75 mg daily however she recently went to the eye doctor and was told she probably needed to discontinue the Topamax. I see nothing in the record. Her tremors were doing well,not bothersome She remains on Keppra She has not had any further seizures. She does complain of occasional fluttering of both her eyes but this is transient and is not similar to her prior seizures. She continues to have right-sided weakness and gait difficulty and incoordination is unchanged. She walks with a single-point cane. She is no longer using her TENS unit as the battery needs replacing. She is also on aspirin and Plavix and CT angio is followed by Dr. Britt Bottom now yearly . Her mild cognitive stable. She has had one fall in the last year. She returns for routine followup.   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  She returns for followup after last visit on 09/20/12. She continues to have headaches several times a week , she does not describe them as  disabling.. She remains on Topamax 25mg  tab, total dose of 75mg  daily, is tolerating well without side effects but complains of mild memory loss. She underwent cerebral catheter angiogram on 10//13 by Dr.Deveshwar which showed stable appearance of pericallosal and left P2/P3 segment aneurysms. She states some mild tremor in the hand continues and it is not disabling.She complains of persistent mild right-sided weakness as well as some temperature sensitivity and sensation of coldness of the right foot ever since her stroke in 2011.She does feel her AFO tens unit is effective. She has fallen recently.     REVIEW OF SYSTEMS: Full 14 system review of systems performed and notable only for those listed, all others are neg:  Constitutional: neg  Cardiovascular: neg Ear/Nose/Throat: neg  Skin: neg Eyes: Blurred vision  Respiratory: neg Gastroitestinal: Urinary frequency Hematology/Lymphatic: Easy bruising Endocrine: neg Musculoskeletal: Muscle cramps Allergy/Immunology: neg Neurological: Memory loss, headaches tremors Psychiatric: Depression Sleep : neg   ALLERGIES: Allergies  Allergen Reactions  . Phenytoin Anaphylaxis, Hives and Swelling    HOME MEDICATIONS: Outpatient Prescriptions Prior to Visit  Medication Sig Dispense Refill  . ALPRAZolam (XANAX) 0.25 MG tablet Take 0.25 mg by mouth 2 (two) times daily as needed for anxiety or sleep.     Marland Kitchen aspirin 81 MG tablet Take 81 mg by mouth daily.    . B Complex-C (SUPER B COMPLEX PO) Take 1 tablet by mouth daily.    . Biotin w/ Vitamins C & E (HAIR SKIN & NAILS GUMMIES PO) Take 1 capsule by mouth 2 (two) times daily.    . Calcium Carb-Cholecalciferol (CALCIUM 500 +D) 500-400 MG-UNIT TABS Take 1 tablet by mouth daily.    . Cholecalciferol (VITAMIN D)  2000 UNITS CAPS Take 2,000 Units by mouth 2 (two) times daily.    . citalopram (CELEXA) 40 MG tablet Take 40 mg by mouth at bedtime.    . clindamycin (CLEOCIN T) 1 % external solution Apply 1  application topically 2 (two) times daily. Apply to face    . clopidogrel (PLAVIX) 75 MG tablet Take 75 mg by mouth daily with breakfast.    . Cranberry-Cholecalciferol (SUPER CRANBERRY/VITAMIN D3 PO) Take 1 tablet by mouth 2 (two) times daily.     . Cyanocobalamin (VITAMIN B-12 PO) Take 1,000 mcg by mouth every morning.    . cyclobenzaprine (FLEXERIL) 10 MG tablet Take 10 mg by mouth at bedtime.     . diclofenac sodium (VOLTAREN) 1 % GEL Apply 2 g topically 3 (three) times daily as needed. For pain    . famotidine (PEPCID) 20 MG tablet Take 20 mg by mouth 2 (two) times daily.     Marland Kitchen levETIRAcetam (KEPPRA) 500 MG tablet TAKE 1 TABLET EVERY 12 HOURS 180 tablet 0  . levothyroxine (SYNTHROID, LEVOTHROID) 50 MCG tablet Take 50 mcg by mouth daily before breakfast.     . losartan (COZAAR) 100 MG tablet Take 100 mg by mouth every morning.     . Multiple Vitamins-Minerals (HAIR/SKIN/NAILS) TABS Take 1 tablet by mouth 2 (two) times daily.    . Omega-3 Fatty Acids (FISH OIL) 1000 MG CAPS Take 1,000 mg by mouth daily.    . rosuvastatin (CRESTOR) 10 MG tablet Take 10 mg by mouth at bedtime.     . topiramate (TOPAMAX) 25 MG tablet Take 1 tablet (25 mg total) by mouth 2 (two) times daily. 1 am and 2 pm (Patient taking differently: Take 25-50 mg by mouth 2 (two) times daily. Take 1 tablet (25mg ) every morning, and 2 tablets (50 mg) every evening) 270 tablet 1  . aspirin EC 81 MG tablet Take 81 mg by mouth daily.    Marland Kitchen lubiprostone (AMITIZA) 24 MCG capsule Take 24 mcg by mouth 2 (two) times daily with a meal. Reported on 11/03/2015     No facility-administered medications prior to visit.    PAST MEDICAL HISTORY: Past Medical History  Diagnosis Date  . Asthma   . COPD (chronic obstructive pulmonary disease) ([place])      - PFTs  01/24/06 FEV1 69% ratio 68% diffusing capacity 57% with no improvement after B2   - PFT's 10/1/ 09 FEV1 77   ratio  76    dlco                   45            no resp to B2    - Nl alpha  one antitripsin level 10/09  . Hyperlipidemia   . Aneurysm ([place])   . Depression   . Anxiety   . Headache(784.0)   . Hypertension   . Hypothyroidism   . Pneumonia     hx  . Stroke [facility]) 2011    rt sided weakness  . Anemia     hx  . History of kidney stones   . Seizure disorder, secondary ([place])     2-3 focal siezures monthly  . Seizures ([place])   . Short-term memory loss     since stroke  . TMJ (temporomandibular joint disorder)   . Increased urinary frequency   . Hammer toe   . Benign hypertensive heart disease   . Hemiparesis affecting dominant side as late effect of  cerebrovascular accident [facility])   . Pure hypercholesterolemia   . Obesity   . Vitamin D deficiency   . Recurrent major depressive episodes ([place])   . Chest pain   . Difficulty speaking   . Malaise and fatigue   . Atrophic vaginitis   . Enthesopathy of elbow region   . GERD (gastroesophageal reflux disease)   . Cardiomegaly   . Urinary tract infectious disease   . Disorder of nasal cavity   . Abnormal gait   . Amnesia   . Low compliance bladder   . Dyspnea   . Vertigo as late effect of stroke   . Skin sensation disturbance   . Nonruptured cerebral aneurysm   . Cough   . Swelling of limb     PAST SURGICAL HISTORY: Past Surgical History  Procedure Laterality Date  . Laparoscopic hysterectomy    . Mandible fracture surgery    . Back surgery    . Cerebral angiogram      x3  . Stones      no surgery  . Radiology with anesthesia N/A 08/21/2013    Procedure: RADIOLOGY WITH ANESTHESIA;  Surgeon: Rob Hickman, MD;  Location: [facility];  Service: Radiology;  Laterality: N/A;    FAMILY HISTORY: Family History  Problem Relation Age of Onset  . Heart disease Father   . Heart attack Father   . Lung cancer    . Heart disease Mother     SOCIAL HISTORY: Social History   Social History  . Marital Status: Married    Spouse Name: Jori Moll  . Number of Children: 2  . Years of Education: 14    Occupational History  . Disabled-former accountant    Social History Main Topics  . Smoking status: Former Smoker    Quit date: 09/05/2008  . Smokeless tobacco: Never Used  . Alcohol Use: No     Comment: patient quit alcohol use 2005.   . Drug Use: No  . Sexual Activity: Not on file   Other Topics Concern  . Not on file   Social History Narrative   Patient lives at home with her husband Jori Moll).   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    Patient is on disability.    Patient has an Associates degree.   Patient is right-handed.   Patient drinks 2 cups of green tea most days.                 PHYSICAL EXAM  Filed Vitals:   11/03/15 1037  BP: 109/75  Pulse: 85  Height: 5\' 2"  (1.575 m)  Weight: 157 lb 9.6 oz (71.487 kg)   Body mass index is 28.82 kg/(m^2). Generalized: Well developed, in no acute distress  Head: normocephalic and atraumatic,.  Neck: Supple, no carotid bruits  Cardiac: Regular rate rhythm, no murmur  Neurological examination  Mentation: Alert oriented to time, place, history taking. Follows all commands speech and language fluent mini-mental status exam 27/30 with deficits in orientation and 1 of 3 recall.AFT 14. Clock Drawing 4/4. Cranial nerve II-XII: Fundi not visualized.Pupils were equal round reactive to light extraocular movements were full, visual field were full on confrontational test. Facial is asymmetric with right lower face weakness . Hearing was intact to finger rubbing bilaterally. Uvula tongue midline. head turning and shoulder shrug were normal and symmetric.Tongue protrusion into cheek strength was normal.  Motor: Mild 4/5 right-sided hemiparesis with distal more  than proximal weakness. Increased tone on the right upper and lower extremity. Diminished fine finger movements on the right . Right foot drop not wearing TENS brace Sensory: Touch and pinprick are normal on the left decreased  on the right, vibratory is decreased to the right ankle  Coordination: Impaired on the right  Reflexes: Brachioradialis 2/2, biceps 2/2, triceps 2/2, patellar 2/2, Achilles 1/1, plantar responses were flexor bilaterally.  Gait and Station: Rising up from seated position without assistance, wide based , right foot drop, using single-point cane, unsteady gait   DIAGNOSTIC DATA (LABS, IMAGING, TESTING) - I reviewed patient records, labs, notes, testing and imaging myself where available.  Lab Results  Component Value Date   WBC 5.3 05/28/2015   HGB 11.8* 05/28/2015   HCT 37.2 05/28/2015   MCV 94.7 05/28/2015   PLT 182 05/28/2015      Component Value Date/Time   NA 143 05/28/2015 1302   K 4.7 05/28/2015 1302   CL 110 05/28/2015 1302   CO2 26 05/28/2015 1302   GLUCOSE 94 05/28/2015 1302   BUN 12 05/28/2015 1302   CREATININE 1.00 08/14/2015 1505   CALCIUM 9.2 05/28/2015 1302   PROT 6.1* 05/28/2015 1302   ALBUMIN 3.4* 05/28/2015 1302   AST 23 05/28/2015 1302   ALT 17 05/28/2015 1302   ALKPHOS 76 05/28/2015 1302   BILITOT 0.5 05/28/2015 1302   GFRNONAA >60 08/14/2015 1505   GFRAA >60 08/14/2015 1505     ASSESSMENT AND PLAN 60 y.o. year old female has a past medical history of left frontoparietal parenchymal hemorrhage March 2011 from PRES and malignant hypertension. Chronic headaches with history of migraines.Placement of pipeline stent across Left PICA aneurysm by Dr. Estanislado Pandy on 08/21/2013. History of seizure disorder . Mild cognitive impairment. Patient was told by ophthalmology to come off of the drug Topamax. I see no record of this  Continue Keppra at current dose check level in the morning before am dose Decrease  Topamax by 25 mg weekly until off the drug. Call for increase in headaches Memory score is stable Use cane at all times for gait abnormality at high risk for falls due to foot drop. Need to get TENS unit fixed and wear when ambulating due to high fall risk   Follow-up in 8 months next with Sloan Leiter, [facility], [facility], [facility] [address], [place], [place] [address] [phone]

## 2015-11-03 NOTE — Progress Notes (Signed)
I agree with the above plan 

## 2015-11-04 ENCOUNTER — Other Ambulatory Visit (INDEPENDENT_AMBULATORY_CARE_PROVIDER_SITE_OTHER): Payer: Self-pay

## 2015-11-04 DIAGNOSIS — Z0289 Encounter for other administrative examinations: Secondary | ICD-10-CM

## 2015-11-04 DIAGNOSIS — R5601 Complex febrile convulsions: Secondary | ICD-10-CM | POA: Diagnosis not present

## 2015-11-04 DIAGNOSIS — Z5181 Encounter for therapeutic drug level monitoring: Secondary | ICD-10-CM | POA: Diagnosis not present

## 2015-11-06 LAB — LEVETIRACETAM LEVEL: Levetiracetam Lvl: 14.6 ug/mL (ref 10.0–40.0)

## 2015-11-09 ENCOUNTER — Telehealth: Payer: Self-pay | Admitting: *Deleted

## 2015-11-09 NOTE — Telephone Encounter (Signed)
I called and LMVM for pt ot return call.   Keppra level (levetiracetam level) is normal.

## 2015-11-09 NOTE — Telephone Encounter (Signed)
Patient returned Sandy's call. Please call [phone].

## 2015-11-09 NOTE — Telephone Encounter (Signed)
Patient returned Sandy's call. Please call [phone].

## 2015-11-09 NOTE — Telephone Encounter (Signed)
-----   Message from Dennie Bible, NP sent at 11/06/2015 11:54 AM EST ----- Good level please call the patient

## 2015-11-09 NOTE — Telephone Encounter (Signed)
I spoke to pt and gave her the results of labs.   She verbalized understanding.

## 2015-11-19 DIAGNOSIS — N390 Urinary tract infection, site not specified: Secondary | ICD-10-CM | POA: Diagnosis not present

## 2015-11-19 DIAGNOSIS — R000 Tachycardia, unspecified: Secondary | ICD-10-CM | POA: Diagnosis not present

## 2015-11-19 DIAGNOSIS — J069 Acute upper respiratory infection, unspecified: Secondary | ICD-10-CM | POA: Diagnosis not present

## 2015-12-02 DIAGNOSIS — R300 Dysuria: Secondary | ICD-10-CM | POA: Diagnosis not present

## 2015-12-02 DIAGNOSIS — N390 Urinary tract infection, site not specified: Secondary | ICD-10-CM | POA: Diagnosis not present

## 2015-12-08 DIAGNOSIS — R310 Gross hematuria: Secondary | ICD-10-CM | POA: Diagnosis not present

## 2015-12-08 DIAGNOSIS — Z0000 Encounter for general adult medical examination without abnormal findings: Secondary | ICD-10-CM | POA: Diagnosis not present

## 2015-12-08 DIAGNOSIS — R3915 Urgency of urination: Secondary | ICD-10-CM | POA: Diagnosis not present

## 2015-12-08 DIAGNOSIS — R350 Frequency of micturition: Secondary | ICD-10-CM | POA: Diagnosis not present

## 2015-12-08 DIAGNOSIS — N3020 Other chronic cystitis without hematuria: Secondary | ICD-10-CM | POA: Diagnosis not present

## 2015-12-21 DIAGNOSIS — N3946 Mixed incontinence: Secondary | ICD-10-CM | POA: Diagnosis not present

## 2015-12-21 DIAGNOSIS — K5909 Other constipation: Secondary | ICD-10-CM | POA: Diagnosis not present

## 2015-12-21 DIAGNOSIS — M62838 Other muscle spasm: Secondary | ICD-10-CM | POA: Diagnosis not present

## 2015-12-21 DIAGNOSIS — R278 Other lack of coordination: Secondary | ICD-10-CM | POA: Diagnosis not present

## 2015-12-21 DIAGNOSIS — M6281 Muscle weakness (generalized): Secondary | ICD-10-CM | POA: Diagnosis not present

## 2016-01-04 DIAGNOSIS — R102 Pelvic and perineal pain: Secondary | ICD-10-CM | POA: Diagnosis not present

## 2016-01-04 DIAGNOSIS — R278 Other lack of coordination: Secondary | ICD-10-CM | POA: Diagnosis not present

## 2016-01-04 DIAGNOSIS — M6281 Muscle weakness (generalized): Secondary | ICD-10-CM | POA: Diagnosis not present

## 2016-01-04 DIAGNOSIS — N3946 Mixed incontinence: Secondary | ICD-10-CM | POA: Diagnosis not present

## 2016-01-04 DIAGNOSIS — M62838 Other muscle spasm: Secondary | ICD-10-CM | POA: Diagnosis not present

## 2016-01-13 DIAGNOSIS — Z0000 Encounter for general adult medical examination without abnormal findings: Secondary | ICD-10-CM | POA: Diagnosis not present

## 2016-01-13 DIAGNOSIS — R310 Gross hematuria: Secondary | ICD-10-CM | POA: Diagnosis not present

## 2016-01-14 DIAGNOSIS — K5909 Other constipation: Secondary | ICD-10-CM | POA: Diagnosis not present

## 2016-01-14 DIAGNOSIS — M62838 Other muscle spasm: Secondary | ICD-10-CM | POA: Diagnosis not present

## 2016-01-14 DIAGNOSIS — N3946 Mixed incontinence: Secondary | ICD-10-CM | POA: Diagnosis not present

## 2016-01-14 DIAGNOSIS — R278 Other lack of coordination: Secondary | ICD-10-CM | POA: Diagnosis not present

## 2016-01-14 DIAGNOSIS — M6281 Muscle weakness (generalized): Secondary | ICD-10-CM | POA: Diagnosis not present

## 2016-02-10 DIAGNOSIS — E039 Hypothyroidism, unspecified: Secondary | ICD-10-CM | POA: Diagnosis not present

## 2016-02-10 DIAGNOSIS — Z79899 Other long term (current) drug therapy: Secondary | ICD-10-CM | POA: Diagnosis not present

## 2016-02-10 DIAGNOSIS — R7309 Other abnormal glucose: Secondary | ICD-10-CM | POA: Diagnosis not present

## 2016-02-10 DIAGNOSIS — I10 Essential (primary) hypertension: Secondary | ICD-10-CM | POA: Diagnosis not present

## 2016-02-10 DIAGNOSIS — F339 Major depressive disorder, recurrent, unspecified: Secondary | ICD-10-CM | POA: Diagnosis not present

## 2016-02-10 DIAGNOSIS — R350 Frequency of micturition: Secondary | ICD-10-CM | POA: Diagnosis not present

## 2016-03-21 DIAGNOSIS — Z79899 Other long term (current) drug therapy: Secondary | ICD-10-CM | POA: Diagnosis not present

## 2016-03-21 DIAGNOSIS — H2513 Age-related nuclear cataract, bilateral: Secondary | ICD-10-CM | POA: Diagnosis not present

## 2016-03-21 DIAGNOSIS — H524 Presbyopia: Secondary | ICD-10-CM | POA: Diagnosis not present

## 2016-03-21 DIAGNOSIS — H52223 Regular astigmatism, bilateral: Secondary | ICD-10-CM | POA: Diagnosis not present

## 2016-03-21 DIAGNOSIS — H353131 Nonexudative age-related macular degeneration, bilateral, early dry stage: Secondary | ICD-10-CM | POA: Diagnosis not present

## 2016-04-04 DIAGNOSIS — R609 Edema, unspecified: Secondary | ICD-10-CM | POA: Diagnosis not present

## 2016-04-04 DIAGNOSIS — K5900 Constipation, unspecified: Secondary | ICD-10-CM | POA: Diagnosis not present

## 2016-04-19 DIAGNOSIS — L570 Actinic keratosis: Secondary | ICD-10-CM | POA: Diagnosis not present

## 2016-04-19 DIAGNOSIS — L700 Acne vulgaris: Secondary | ICD-10-CM | POA: Diagnosis not present

## 2016-06-01 DIAGNOSIS — J309 Allergic rhinitis, unspecified: Secondary | ICD-10-CM | POA: Diagnosis not present

## 2016-06-01 DIAGNOSIS — Z23 Encounter for immunization: Secondary | ICD-10-CM | POA: Diagnosis not present

## 2016-06-01 DIAGNOSIS — N390 Urinary tract infection, site not specified: Secondary | ICD-10-CM | POA: Diagnosis not present

## 2016-06-27 ENCOUNTER — Encounter (HOSPITAL_COMMUNITY): Payer: Self-pay | Admitting: *Deleted

## 2016-06-27 ENCOUNTER — Ambulatory Visit (HOSPITAL_COMMUNITY)
Admission: EM | Admit: 2016-06-27 | Discharge: 2016-06-27 | Disposition: A | Discharge status: Home / Self Care | Payer: Medicare Other | Attending: Family Medicine | Admitting: Family Medicine

## 2016-06-27 ENCOUNTER — Ambulatory Visit (INDEPENDENT_AMBULATORY_CARE_PROVIDER_SITE_OTHER): Payer: Medicare Other

## 2016-06-27 DIAGNOSIS — S6991XA Unspecified injury of right wrist, hand and finger(s), initial encounter: Secondary | ICD-10-CM | POA: Diagnosis not present

## 2016-06-27 DIAGNOSIS — S6391XA Sprain of unspecified part of right wrist and hand, initial encounter: Secondary | ICD-10-CM

## 2016-06-27 DIAGNOSIS — M79641 Pain in right hand: Secondary | ICD-10-CM | POA: Diagnosis not present

## 2016-06-27 NOTE — ED Triage Notes (Signed)
Pt   Fell     inj  r  Hand         Pain  And   Swelling  Noted          Symptoms  X  8  Days  Ago        Pain  And   Swelling  Noted

## 2016-06-27 NOTE — ED Provider Notes (Signed)
[place]    CSN: [id number] Arrival date & time: 06/27/16  0957     History   Chief Complaint Chief Complaint  Patient presents with  . Fall    HPI [name] is a 60 y.o. female.   This 60 year old woman with balance issues who fell 9 days ago. She had a hemorrhagic stroke in 2011 which left her a phasic with right hemiplegia. Since that time she has regained her speech and much of her mobility her balance is still less than satisfactory. She walks with a cane.  9 days ago she lost her balance and fell striking her right hand on the floor. She's not exactly sure the mechanism of the injury but she noticed immediate swelling. She's had pain in the index finger MCP joint ever since although she does have good range of motion of her wrist and all of her fingers. The ecchymosis is gradually fading. There is no pain on other parts of her body but she does have residual numbness in the right side of her body.      Past Medical History:  Diagnosis Date  . Abnormal gait   . Amnesia   . Anemia    hx  . Aneurysm ([place])   . Anxiety   . Asthma   . Atrophic vaginitis   . Benign hypertensive heart disease   . Cardiomegaly   . Chest pain   . COPD (chronic obstructive pulmonary disease) ([place])     - PFTs  01/24/06 FEV1 69% ratio 68% diffusing capacity 57% with no improvement after B2   - PFT's 10/1/ 09 FEV1 77   ratio  76    dlco                   45            no resp to B2    - Nl alpha one antitripsin level 10/09  . Cough   . Depression   . Difficulty speaking   . Disorder of nasal cavity   . Dyspnea   . Enthesopathy of elbow region   . GERD (gastroesophageal reflux disease)   . Hammer toe   . Headache(784.0)   . Hemiparesis affecting dominant side as late effect of cerebrovascular accident ([place])   . History of kidney stones   . Hyperlipidemia   . Hypertension   . Hypothyroidism   . Increased urinary frequency   . Low compliance bladder   . Malaise  and fatigue   . Nonruptured cerebral aneurysm   . Obesity   . Pneumonia    hx  . Pure hypercholesterolemia   . Recurrent major depressive episodes ([place])   . Seizure disorder, secondary ([place])    2-3 focal siezures monthly  . Seizures ([place])   . Short-term memory loss    since stroke  . Skin sensation disturbance   . Stroke [facility]) 2011   rt sided weakness  . Swelling of limb   . TMJ (temporomandibular joint disorder)   . Urinary tract infectious disease   . Vertigo as late effect of stroke   . Vitamin D deficiency     Patient Active Problem List   Diagnosis Date Noted  . Aneurysm ([place])   . Brain aneurysm 08/21/2013  . Right foot drop 04/10/2013  . Intracerebral hemorrhage ([place]) 03/19/2013  . Convulsions ([place]) 03/19/2013  . Headache 03/19/2013  . Abnormality of gait 03/19/2013  . Edema 02/23/2011  . COUGH 06/10/2008  .  HYPERLIPIDEMIA 04/25/2008  . TOBACCO ABUSE 04/25/2008  . ASTHMA 04/25/2008  . C O P D 04/25/2008    Past Surgical History:  Procedure Laterality Date  . BACK SURGERY    . CEREBRAL ANGIOGRAM     x3  . LAPAROSCOPIC HYSTERECTOMY    . MANDIBLE FRACTURE SURGERY    . RADIOLOGY WITH ANESTHESIA N/A 08/21/2013   Procedure: RADIOLOGY WITH ANESTHESIA;  Surgeon: Rob Hickman, MD;  Location: [facility];  Service: Radiology;  Laterality: N/A;  . stones     no surgery    OB History    No data available       Home Medications    Prior to Admission medications   Medication Sig Start Date End Date Taking? Authorizing Provider  ALPRAZolam (XANAX) 0.25 MG tablet Take 0.25 mg by mouth 2 (two) times daily as needed for anxiety or sleep.     Historical Provider, MD  aspirin 81 MG tablet Take 81 mg by mouth daily.    Historical Provider, MD  B Complex-C (SUPER B COMPLEX PO) Take 1 tablet by mouth daily.    Historical Provider, MD  Biotin w/ Vitamins C & E (HAIR SKIN & NAILS GUMMIES PO) Take 1 capsule by mouth 2 (two) times daily.    Historical Provider, MD    Calcium Carb-Cholecalciferol (CALCIUM 500 +D) 500-400 MG-UNIT TABS Take 1 tablet by mouth daily.    Historical Provider, MD  Cholecalciferol (VITAMIN D) 2000 UNITS CAPS Take 2,000 Units by mouth 2 (two) times daily.    Historical Provider, MD  citalopram (CELEXA) 40 MG tablet Take 40 mg by mouth at bedtime.    Historical Provider, MD  clindamycin (CLEOCIN T) 1 % external solution Apply 1 application topically 2 (two) times daily. Apply to face 06/06/13   Historical Provider, MD  clopidogrel (PLAVIX) 75 MG tablet Take 75 mg by mouth daily with breakfast.    Historical Provider, MD  Cranberry-Cholecalciferol (SUPER CRANBERRY/VITAMIN D3 PO) Take 1 tablet by mouth 2 (two) times daily.     Historical Provider, MD  Cyanocobalamin (VITAMIN B-12 PO) Take 1,000 mcg by mouth every morning.    Historical Provider, MD  cyclobenzaprine (FLEXERIL) 10 MG tablet Take 10 mg by mouth at bedtime.     Historical Provider, MD  diclofenac sodium (VOLTAREN) 1 % GEL Apply 2 g topically 3 (three) times daily as needed. For pain    Historical Provider, MD  famotidine (PEPCID) 20 MG tablet Take 20 mg by mouth 2 (two) times daily.     Historical Provider, MD  levETIRAcetam (KEPPRA) 500 MG tablet Take 1 tablet (500 mg total) by mouth every 12 (twelve) hours. 11/03/15   Dennie Bible, NP  levothyroxine (SYNTHROID, LEVOTHROID) 50 MCG tablet Take 50 mcg by mouth daily before breakfast.  03/08/13   Historical Provider, MD  losartan (COZAAR) 100 MG tablet Take 100 mg by mouth every morning.     Historical Provider, MD  Multiple Vitamins-Minerals (HAIR/SKIN/NAILS) TABS Take 1 tablet by mouth 2 (two) times daily.    Historical Provider, MD  Omega-3 Fatty Acids (FISH OIL) 1000 MG CAPS Take 1,000 mg by mouth daily.    Historical Provider, MD  rosuvastatin (CRESTOR) 10 MG tablet Take 10 mg by mouth at bedtime.     Historical Provider, MD  topiramate (TOPAMAX) 25 MG tablet Take 1 tablet (25 mg total) by mouth 2 (two) times daily. 1 am  and 2 pm Patient taking differently: Take 25-50 mg by mouth 2 (  two) times daily. Take 1 tablet (25mg ) every morning, and 2 tablets (50 mg) every evening 05/05/15   Dennie Bible, NP  tretinoin (RETIN-A) 0.05 % cream APP ON THE SKIN NIGHTLY 10/21/15   Historical Provider, MD    Family History Family History  Problem Relation Age of Onset  . Heart disease Mother   . Heart disease Father   . Heart attack Father   . Lung cancer      Social History Social History  Substance Use Topics  . Smoking status: Former Smoker    Quit date: 09/05/2008  . Smokeless tobacco: Never Used  . Alcohol use No     Comment: patient quit alcohol use 2005.      Allergies   Phenytoin   Review of Systems Review of Systems  Constitutional: Negative for activity change, appetite change, chills, diaphoresis, fatigue, fever and unexpected weight change.  HENT: Negative.   Eyes: Negative.   Respiratory: Negative.   Cardiovascular: Negative.   Gastrointestinal: Negative.   Musculoskeletal: Positive for joint swelling.  Neurological: Positive for facial asymmetry and weakness.     Physical Exam Triage Vital Signs ED Triage Vitals [06/27/16 1009]  Enc Vitals Group     BP 119/79     Pulse Rate 92     Resp 16     Temp 97.7 F (36.5 C)     Temp Source Oral     SpO2 95 %     Weight      Height      Head Circumference      Peak Flow      Pain Score      Pain Loc      Pain Edu?      Excl. in [place]?    No data found.   Updated Vital Signs BP 119/79 (BP Location: Left Arm)   Pulse 92   Temp 97.7 F (36.5 C) (Oral)   Resp 16   SpO2 95%    Physical Exam  Constitutional: She is oriented to person, place, and time. She appears well-developed and well-nourished.  HENT:  Head: Atraumatic.  Right Ear: External ear normal.  Left Ear: External ear normal.  Mouth/Throat: Oropharynx is clear and moist.  Eyes: Conjunctivae and EOM are normal. Pupils are equal, round, and reactive to light.    Neck: Normal range of motion. Neck supple.  Musculoskeletal:  Patient's right hand is diffusely swollen on the dorsal aspect with some fading ecchymosis laterally (over the ulnar side). She is tender over the index finger MCP but she does have full range of motion of her finger, wrist, and elbow.  Neurological: She is alert and oriented to person, place, and time. A cranial nerve deficit is present.  Patient has a right facial weakness  Nursing note and vitals reviewed.    UC Treatments / Results  Labs (all labs ordered are listed, but only abnormal results are displayed) Labs Reviewed - No data to display  EKG  EKG Interpretation None       Radiology Dg Hand Complete Right  Result Date: 06/27/2016 CLINICAL DATA:  Right hand pain after fall 1 week ago at home. EXAM: RIGHT HAND - COMPLETE 3+ VIEW COMPARISON:  None. FINDINGS: There is no evidence of fracture or dislocation. There is no evidence of arthropathy or other focal bone abnormality. Soft tissues are unremarkable. IMPRESSION: Normal right hand. Electronically Signed   By: Marijo Conception, M.D.   On: 06/27/2016 10:42  Procedures Procedures (including critical care time)  Medications Ordered in UC Medications - No data to display   Initial Impression / Assessment and Plan / UC Course  I have reviewed the triage vital signs and the nursing notes.  Pertinent labs & imaging results that were available during my care of the patient were reviewed by me and considered in my medical decision making (see chart for details).  Clinical Course    Final Clinical Impressions(s) / UC Diagnoses   Final diagnoses:  Hand sprain, right, initial encounter    New Prescriptions New Prescriptions   No medications on file  ibuprofen and compression   Robyn Haber, MD 06/27/16 1058

## 2016-06-27 NOTE — Discharge Instructions (Signed)
If the swelling does not go down in another 5 to 7 days, please see your doctor or return here.

## 2016-07-11 ENCOUNTER — Encounter: Payer: Self-pay | Admitting: Neurology

## 2016-07-11 ENCOUNTER — Ambulatory Visit (INDEPENDENT_AMBULATORY_CARE_PROVIDER_SITE_OTHER): Payer: Medicare Other | Admitting: Neurology

## 2016-07-11 VITALS — BP 111/74 | HR 83 | Ht 62.0 in | Wt 159.0 lb

## 2016-07-11 DIAGNOSIS — I619 Nontraumatic intracerebral hemorrhage, unspecified: Secondary | ICD-10-CM

## 2016-07-11 DIAGNOSIS — W19XXXA Unspecified fall, initial encounter: Secondary | ICD-10-CM

## 2016-07-11 DIAGNOSIS — R269 Unspecified abnormalities of gait and mobility: Secondary | ICD-10-CM

## 2016-07-11 NOTE — Patient Instructions (Addendum)
I had a long discussion the patient and husband regarding her remote stroke and post stroke gait difficulties and answered questions. Continue Plavix for stroke prevention with strict control of hypertension with blood pressure goal below 130/90 and lipids with LDL cholesterol goal below 70 mg percent. I also counseled the patient to use a cane in all times while walking and fall and safety precautions. Continue Topamax 25 mg twice daily for migraine prevention. Patient was advised to see her primary care physician for evaluation for right hand swelling following her recent fall. She was encouraged to continue to use the L300  unit for right foot drop help her walk. She will return for follow-up in 6 months or call earlier if necessary  Fall Prevention in the [place] can cause injuries and can affect people from all age groups. There are many simple things that you can do to make your home safe and to help prevent falls. WHAT CAN I DO ON THE OUTSIDE OF MY HOME?  Regularly repair the edges of walkways and driveways and fix any cracks.  Remove high doorway thresholds.  Trim any shrubbery on the main path into your home.  Use bright outdoor lighting.  Clear walkways of debris and clutter, including tools and rocks.  Regularly check that handrails are securely fastened and in good repair. Both sides of any steps should have handrails.  Install guardrails along the edges of any raised decks or porches.  Have leaves, snow, and ice cleared regularly.  Use sand or salt on walkways during winter months.  In the garage, clean up any spills right away, including grease or oil spills. WHAT CAN I DO IN THE BATHROOM?  Use night lights.  Install grab bars by the toilet and in the tub and shower. Do not use towel bars as grab bars.  Use non-skid mats or decals on the floor of the tub or shower.  If you need to sit down while you are in the shower, use a plastic, non-slip stool.Marland Kitchen  Keep the  floor dry. Immediately clean up any water that spills on the floor.  Remove soap buildup in the tub or shower on a regular basis.  Attach bath mats securely with double-sided non-slip rug tape.  Remove throw rugs and other tripping hazards from the floor. WHAT CAN I DO IN THE BEDROOM?  Use night lights.  Make sure that a bedside light is easy to reach.  Do not use oversized bedding that drapes onto the floor.  Have a firm chair that has side arms to use for getting dressed.  Remove throw rugs and other tripping hazards from the floor. WHAT CAN I DO IN THE KITCHEN?   Clean up any spills right away.  Avoid walking on wet floors.  Place frequently used items in easy-to-reach places.  If you need to reach for something above you, use a sturdy step stool that has a grab bar.  Keep electrical cables out of the way.  Do not use floor polish or wax that makes floors slippery. If you have to use wax, make sure that it is non-skid floor wax.  Remove throw rugs and other tripping hazards from the floor. WHAT CAN I DO IN THE STAIRWAYS?  Do not leave any items on the stairs.  Make sure that there are handrails on both sides of the stairs. Fix handrails that are broken or loose. Make sure that handrails are as long as the stairways.  Check any carpeting to  make sure that it is firmly attached to the stairs. Fix any carpet that is loose or worn.  Avoid having throw rugs at the top or bottom of stairways, or secure the rugs with carpet tape to prevent them from moving.  Make sure that you have a light switch at the top of the stairs and the bottom of the stairs. If you do not have them, have them installed. WHAT ARE SOME OTHER FALL PREVENTION TIPS?  Wear closed-toe shoes that fit well and support your feet. Wear shoes that have rubber soles or low heels.  When you use a stepladder, make sure that it is completely opened and that the sides are firmly locked. Have someone hold the  ladder while you are using it. Do not climb a closed stepladder.  Add color or contrast paint or tape to grab bars and handrails in your home. Place contrasting color strips on the first and last steps.  Use mobility aids as needed, such as canes, walkers, scooters, and crutches.  Turn on lights if it is dark. Replace any light bulbs that burn out.  Set up furniture so that there are clear paths. Keep the furniture in the same spot.  Fix any uneven floor surfaces.  Choose a carpet design that does not hide the edge of steps of a stairway.  Be aware of any and all pets.  Review your medicines with your healthcare provider. Some medicines can cause dizziness or changes in blood pressure, which increase your risk of falling. Talk with your health care provider about other ways that you can decrease your risk of falls. This may include working with a physical therapist or trainer to improve your strength, balance, and endurance.   This information is not intended to replace advice given to you by your health care provider. Make sure you discuss any questions you have with your health care provider.   Document Released: 08/12/2002 Document Revised: 01/06/2015 Document Reviewed: 09/26/2014 Elsevier Interactive Patient Education [employer].

## 2016-07-11 NOTE — Progress Notes (Signed)
[facility]  PATIENT: [name] DOB: 12/01/1955   REASON FOR VISIT: Follow-up for nontraumatic intracerebral hemorrhage, abnormality of gait, vascular headache, right foot drop complex partial seizures, mild cognitive impairment HISTORY FROM: Patient    HISTORY OF PRESENT ILLNESS:2/28/17[name], 60 year old female returns for followup. She was last seen in our office 05/05/15. 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 She states her headaches are doing well on Topamax 75 mg daily however she recently went to the eye doctor and was told she probably needed to discontinue the Topamax. I see nothing in the record. Her tremors were doing well,not bothersome She remains on Keppra She has not had any further seizures. She does complain of occasional fluttering of both her eyes but this is transient and is not similar to her prior seizures. She continues to have right-sided weakness and gait difficulty and incoordination is unchanged. She walks with a single-point cane. She is no longer using her TENS unit as the battery needs replacing. She is also on aspirin and Plavix and CT angio is followed by Dr. Britt Bottom now yearly . Her mild cognitive stable. She has had one fall in the last year. She returns for routine followup.   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  She returns for followup after last visit on 09/20/12. She continues to have headaches several times a week , she does not describe them as  disabling.. She remains on Topamax 25mg  tab, total dose of 75mg  daily, is tolerating well without side effects but complains of mild memory loss. She underwent cerebral catheter angiogram on 10//13 by Dr.Deveshwar which showed stable appearance of pericallosal and left P2/P3 segment aneurysms. She states some mild tremor in the hand continues and it is not disabling.She complains of persistent mild right-sided weakness as well as some temperature sensitivity and sensation of coldness of the right foot ever since her stroke in 2011.She does feel her AFO tens unit is effective. She has fallen recently.   Update 07/11/2016 :  She returns for follow-up after last visit 8 months ago. She is accompanied by husband. Patient states she is doing well except that she had a fall a month ago. She bruised her right hand which is yet swollen and painful. She was seen in urgent care and x-rays did not show any fracture but her symptoms have persisted. She has not yet seen her primary physician for this. She states her migraines are doing well and she has only occasional 1 or 2 headaches a month which are not bad. She remains on Topamax 25 g twice daily which is tolerating well. She continues to have poor short-term memory difficulties which are about unchanged. She states her blood pressure is well controlled. She is tolerating Lipitor well without muscle aches or pain. She remains on Plavix which she does bruise easily but has not had any major bleeding problems. She is walking with a cane and uses a new TENS unit L3 100 she seems to work well. She has no new complaints.  REVIEW OF SYSTEMS: Full 14 system review of systems performed and notable only for those  listed, all others are neg: Eye discharge, loss of vision, blurred vision, ear discharge, leg swelling, rectal bleeding, constipation, daytime sleepiness, sleep talking, incontinence of bladder, walking difficulty, neck pain and stiffness, memory loss, dizziness,  headache, easy bruising, numbness, weakness, tremors, facial drooping, agitation, confusion, decreased concentration, depression, nervousness and all other systems negative    ALLERGIES: Allergies  Allergen Reactions  . Phenytoin Anaphylaxis, Hives and Swelling    HOME MEDICATIONS: Outpatient Medications Prior to Visit  Medication Sig Dispense Refill  . ALPRAZolam (XANAX) 0.25 MG tablet Take 0.25 mg by mouth 2 (two) times daily as needed for anxiety or sleep.     . B Complex-C (SUPER B COMPLEX PO) Take 1 tablet by mouth daily.    . Biotin w/ Vitamins C & E (HAIR SKIN & NAILS GUMMIES PO) Take 1 capsule by mouth 2 (two) times daily.    . Calcium Carb-Cholecalciferol (CALCIUM 500 +D) 500-400 MG-UNIT TABS Take 1 tablet by mouth daily.    . Cholecalciferol (VITAMIN D) 2000 UNITS CAPS Take 2,000 Units by mouth 2 (two) times daily.    . citalopram (CELEXA) 40 MG tablet Take 40 mg by mouth at bedtime.    . clindamycin (CLEOCIN T) 1 % external solution Apply 1 application topically 2 (two) times daily. Apply to face    . clopidogrel (PLAVIX) 75 MG tablet Take 75 mg by mouth daily with breakfast.    . Cranberry-Cholecalciferol (SUPER CRANBERRY/VITAMIN D3 PO) Take 1 tablet by mouth 2 (two) times daily.     . Cyanocobalamin (VITAMIN B-12 PO) Take 1,000 mcg by mouth every morning.    . cyclobenzaprine (FLEXERIL) 10 MG tablet Take 10 mg by mouth at bedtime.     . diclofenac sodium (VOLTAREN) 1 % GEL Apply 2 g topically 3 (three) times daily as needed. For pain    . famotidine (PEPCID) 20 MG tablet Take 20 mg by mouth 2 (two) times daily.     Marland Kitchen levETIRAcetam (KEPPRA) 500 MG tablet Take 1 tablet (500 mg total) by mouth every 12 (twelve) hours. 180 tablet 1  . levothyroxine (SYNTHROID, LEVOTHROID) 50 MCG tablet Take 50 mcg by mouth daily before breakfast.     . losartan (COZAAR) 100 MG tablet Take 100 mg by mouth every morning.     . Multiple Vitamins-Minerals (HAIR/SKIN/NAILS) TABS Take 1 tablet by mouth  2 (two) times daily.    . Omega-3 Fatty Acids (FISH OIL) 1000 MG CAPS Take 1,000 mg by mouth daily.    . rosuvastatin (CRESTOR) 10 MG tablet Take 10 mg by mouth at bedtime.     . topiramate (TOPAMAX) 25 MG tablet Take 1 tablet (25 mg total) by mouth 2 (two) times daily. 1 am and 2 pm (Patient taking differently: Take 25-50 mg by mouth 2 (two) times daily. Take 1 tablet (25mg ) every morning, and 2 tablets (50 mg) every evening) 270 tablet 1  . tretinoin (RETIN-A) 0.05 % cream APP ON THE SKIN NIGHTLY  3  . aspirin 81 MG tablet Take 81 mg by mouth daily.     No facility-administered medications prior to visit.     PAST MEDICAL HISTORY: Past Medical History:  Diagnosis Date  . Abnormal gait   . Amnesia   . Anemia    hx  . Aneurysm ([place])   . Anxiety   . Asthma   . Atrophic vaginitis   . Benign hypertensive heart disease   . Cardiomegaly   . Chest pain   . COPD (  chronic obstructive pulmonary disease) (HCC)     - PFTs  01/24/06 FEV1 69% ratio 68% diffusing capacity 57% with no improvement after B2   - PFT's 10/1/ 09 FEV1 77   ratio  76    dlco                   45            no resp to B2    - Nl alpha one antitripsin level 10/09  . Cough   . Depression   . Difficulty speaking   . Disorder of nasal cavity   . Dyspnea   . Enthesopathy of elbow region   . GERD (gastroesophageal reflux disease)   . Hammer toe   . Headache(784.0)   . Hemiparesis affecting dominant side as late effect of cerebrovascular accident ([place])   . History of kidney stones   . Hyperlipidemia   . Hypertension   . Hypothyroidism   . Increased urinary frequency   . Low compliance bladder   . Malaise and fatigue   . Nonruptured cerebral aneurysm   . Obesity   . Pneumonia    hx  . Pure hypercholesterolemia   . Recurrent major depressive episodes ([place])   . Seizure disorder, secondary ([place])    2-3 focal siezures monthly  . Seizures ([place])   . Short-term memory loss    since stroke  . Skin sensation disturbance    . Stroke [facility]) 2011   rt sided weakness  . Swelling of limb   . TMJ (temporomandibular joint disorder)   . Urinary tract infectious disease   . Vertigo as late effect of stroke   . Vitamin D deficiency     PAST SURGICAL HISTORY: Past Surgical History:  Procedure Laterality Date  . BACK SURGERY    . CEREBRAL ANGIOGRAM     x3  . LAPAROSCOPIC HYSTERECTOMY    . MANDIBLE FRACTURE SURGERY    . RADIOLOGY WITH ANESTHESIA N/A 08/21/2013   Procedure: RADIOLOGY WITH ANESTHESIA;  Surgeon: Rob Hickman, MD;  Location: [place];  Service: Radiology;  Laterality: N/A;  . stones     no surgery    FAMILY HISTORY: Family History  Problem Relation Age of Onset  . Heart disease Mother   . Heart disease Father   . Heart attack Father   . Lung cancer      SOCIAL HISTORY: Social History   Social History  . Marital status: Married    Spouse name: Jori Moll  . Number of children: 2  . Years of education: 14   Occupational History  . 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 Smoking status: Former Smoker    Quit date: 09/05/2008  . Smokeless tobacco: Never Used  . Alcohol use No     Comment: patient quit alcohol use 2005.   . Drug use: No  . Sexual activity: Not on file   Other Topics Concern  . Not on file   Social History Narrative   Patient lives at home with her husband Jori Moll).   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    Patient is on disability.    Patient has an Associates degree.   Patient is right-handed.   Patient drinks 2 cups of green tea most days.                 PHYSICAL EXAM  Vitals:   07/11/16 1028  BP: 111/74  Pulse: 83  Weight: 159 lb (72.1 kg)  Height: 5\' 2"  (1.575 m)   Body mass index is 29.08 kg/m. Generalized: Well developed middle aged 31 lady, in no acute distress  Head: normocephalic and atraumatic,.  Neck: Supple, no carotid bruits  Cardiac:  Regular rate rhythm, no murmur  Neurological examination  Mentation: Alert oriented to time, place, history taking. Follows all commands speech and language fluent mini-mental status exam not done today but diminished recall 2/3. Able to name 12 animals. Clock drawing 4/4.Marland Kitchen Cranial nerve II-XII: Fundi not visualized.Pupils were equal round reactive to light extraocular movements were full, visual field were full on confrontational test. Facial is asymmetric with right lower face weakness . Hearing was intact to finger rubbing bilaterally. Uvula tongue midline. head turning and shoulder shrug were normal and symmetric.Tongue protrusion into cheek strength was normal.  Motor: Mild 4/5 right-sided hemiparesis with distal more than proximal weakness. Increased tone on the right upper and lower extremity. Diminished fine finger movements on the right . Right foot drop not wearing TENS brace Sensory: Touch and pinprick are normal on the left decreased on the right, vibratory is decreased to the right ankle  Coordination: Impaired on the right  Reflexes: Brachioradialis 2/2, biceps 2/2, triceps 2/2, patellar 2/2, Achilles 1/1, plantar responses were flexor bilaterally.  Gait and Station: Rising up from seated position without assistance, wide based , right foot drop, using single-point cane, unsteady gait   DIAGNOSTIC DATA (LABS, IMAGING, TESTING) - I reviewed patient records, labs, notes, testing and imaging myself where available.  Lab Results  Component Value Date   WBC 5.3 05/28/2015   HGB 11.8 (L) 05/28/2015   HCT 37.2 05/28/2015   MCV 94.7 05/28/2015   PLT 182 05/28/2015      Component Value Date/Time   NA 143 05/28/2015 1302   K 4.7 05/28/2015 1302   CL 110 05/28/2015 1302   CO2 26 05/28/2015 1302   GLUCOSE 94 05/28/2015 1302   BUN 12 05/28/2015 1302   CREATININE 1.00 08/14/2015 1505   CALCIUM 9.2 05/28/2015 1302   PROT 6.1 (L) 05/28/2015 1302   ALBUMIN 3.4 (L) 05/28/2015 1302    AST 23 05/28/2015 1302   ALT 17 05/28/2015 1302   ALKPHOS 76 05/28/2015 1302   BILITOT 0.5 05/28/2015 1302   GFRNONAA >60 08/14/2015 1505   GFRAA >60 08/14/2015 1505     ASSESSMENT AND PLAN 60 y.o. year old female has a past medical history of left frontoparietal parenchymal hemorrhage March 2011 from PRES and malignant hypertension. Chronic headaches with history of migraines.Placement of pipeline stent across Left PICA aneurysm by Dr. Estanislado Pandy on 08/21/2013. History of seizure disorder . Mild cognitive impairment.   I had a long discussion the patient and husband regarding her remote stroke and post stroke gait difficulties and answered questions. Continue Plavix for stroke prevention with strict control of hypertension with blood pressure goal below 130/90 and lipids with LDL cholesterol goal below 70 mg percent. I also counseled the patient to use a cane in all times while walking and fall and safety precautions. Continue Topamax 25 mg twice daily for migraine prevention. Patient was advised to see her primary care physician for evaluation for right hand swelling following her recent fall. She was encouraged to continue to use the L300  unit for right foot drop help her walk. She will return for follow-up in 6 months or call earlier if necessary Antony Contras,  MD [facility] [address] [place], [place] [address] [phone]

## 2016-07-14 ENCOUNTER — Ambulatory Visit
Admission: RE | Admit: 2016-07-14 | Discharge: 2016-07-14 | Disposition: A | Discharge status: Home / Self Care | Payer: Medicare Other | Source: Ambulatory Visit | Attending: Internal Medicine | Admitting: Internal Medicine

## 2016-07-14 ENCOUNTER — Other Ambulatory Visit: Payer: Self-pay | Admitting: Internal Medicine

## 2016-07-14 DIAGNOSIS — Z1231 Encounter for screening mammogram for malignant neoplasm of breast: Secondary | ICD-10-CM

## 2016-07-14 DIAGNOSIS — E785 Hyperlipidemia, unspecified: Secondary | ICD-10-CM | POA: Diagnosis not present

## 2016-07-14 DIAGNOSIS — Z0000 Encounter for general adult medical examination without abnormal findings: Secondary | ICD-10-CM | POA: Diagnosis not present

## 2016-07-14 DIAGNOSIS — R7309 Other abnormal glucose: Secondary | ICD-10-CM | POA: Diagnosis not present

## 2016-07-14 DIAGNOSIS — E559 Vitamin D deficiency, unspecified: Secondary | ICD-10-CM | POA: Diagnosis not present

## 2016-07-14 DIAGNOSIS — R296 Repeated falls: Secondary | ICD-10-CM | POA: Diagnosis not present

## 2016-07-14 DIAGNOSIS — I10 Essential (primary) hypertension: Secondary | ICD-10-CM | POA: Diagnosis not present

## 2016-07-20 ENCOUNTER — Ambulatory Visit: Payer: Medicare Other | Attending: Nurse Practitioner | Admitting: Rehabilitative and Restorative Service Providers"

## 2016-07-20 DIAGNOSIS — R2681 Unsteadiness on feet: Secondary | ICD-10-CM | POA: Insufficient documentation

## 2016-07-20 DIAGNOSIS — R2689 Other abnormalities of gait and mobility: Secondary | ICD-10-CM | POA: Insufficient documentation

## 2016-07-20 DIAGNOSIS — M6281 Muscle weakness (generalized): Secondary | ICD-10-CM | POA: Diagnosis not present

## 2016-07-20 NOTE — Patient Instructions (Signed)
Heel Cord Stretch    Place one leg forward, bent, other leg behind and straight. Lean forward keeping back heel flat. Hold __20__ seconds while counting out loud. Repeat with other leg. Repeat __3__ times. Do __2__ sessions per day.  http://gt2.exer.us/512   Copyright  VHI. All rights reserved.   Hamstring Stretch, Seated (Strap, Two Chairs)    Sit with one leg extended onto facing chair. No strap around foot for now*.  Lengthen spine. Hold for __20 seconds. Repeat __3__ times each leg. 2 times/day.   Copyright  VHI. All rights reserved.   Functional Quadriceps: Sit to Stand    Sit on edge of chair, feet flat on floor. Stand upright, extending knees fully.  HAVE A STURDY CHAIR OR ONE PROPPED AGAINST THE WALL. Repeat __5-10__ times per set.  Do __2__ sessions per day.  http://orth.exer.us/735   Copyright  VHI. All rights reserved.

## 2016-07-21 NOTE — Therapy (Signed)
[facility] [address] [place], [place], [address] Phone: [phone]   Fax:  [phone]  Physical Therapy Evaluation  Patient Details  Name: [name] MRN: [id number] Date of Birth: 12-22-1955 Referring Provider: Arnette Felts, MD  Encounter Date: 07/20/2016      PT End of Session - 07/21/16 1253    Visit Number 1   Number of Visits 8   Date for PT Re-Evaluation 09/04/16   Authorization Type G code every 10th visit   PT Start Time 1150   PT Stop Time 1232   PT Time Calculation (min) 42 min   Equipment Utilized During Treatment Gait belt   Activity Tolerance Patient tolerated treatment well   Behavior During Therapy [facility] for tasks assessed/performed      Past Medical History:  Diagnosis Date  . Abnormal gait   . Amnesia   . Anemia    hx  . Aneurysm (HCC)   . Anxiety   . Asthma   . Atrophic vaginitis   . Benign hypertensive heart disease   . Cardiomegaly   . Chest pain   . COPD (chronic obstructive pulmonary disease) (HCC)     - PFTs  01/24/06 FEV1 69% ratio 68% diffusing capacity 57% with no improvement after B2   - PFT's 10/1/ 09 FEV1 77   ratio  76    dlco                   45            no resp to B2    - Nl alpha one antitripsin level 10/09  . Cough   . Depression   . Difficulty speaking   . Disorder of nasal cavity   . Dyspnea   . Enthesopathy of elbow region   . GERD (gastroesophageal reflux disease)   . Hammer toe   . Headache(784.0)   . Hemiparesis affecting dominant side as late effect of cerebrovascular accident (HCC)   . History of kidney stones   . Hyperlipidemia   . Hypertension   . Hypothyroidism   . Increased urinary frequency   . Low compliance bladder   . Malaise and fatigue   . Nonruptured cerebral aneurysm   . Obesity   . Pneumonia    hx  . Pure hypercholesterolemia   . Recurrent major depressive episodes (HCC)   . Seizure disorder, secondary (HCC)    2-3  focal siezures monthly  . Seizures (HCC)   . Short-term memory loss    since stroke  . Skin sensation disturbance   . Stroke [facility]) 2011   rt sided weakness  . Swelling of limb   . TMJ (temporomandibular joint disorder)   . Urinary tract infectious disease   . Vertigo as late effect of stroke   . Vitamin D deficiency     Past Surgical History:  Procedure Laterality Date  . BACK SURGERY    . CEREBRAL ANGIOGRAM     x3  . LAPAROSCOPIC HYSTERECTOMY    . MANDIBLE FRACTURE SURGERY    . RADIOLOGY WITH ANESTHESIA N/A 08/21/2013   Procedure: RADIOLOGY WITH ANESTHESIA;  Surgeon: Oneal Grout, MD;  Location: [facility];  Service: Radiology;  Laterality: N/A;  . stones     no surgery    There were no vitals filed for this visit.       Subjective Assessment - 07/20/16 1151    Subjective The patient is known to our clinic  from 2011-2012 and notes a significant increase in falls over the past year.  She had a fall recently stepping up on a curb in community and estimates 4+ in the past month (estimates 10 falls over past 6 months).    She is unsure what is leading to falls.  She is unsure if she is dizzy.     Patient is accompained by: Family member  husband drove patient, waits in lobby   Patient Stated Goals Confidence with mobility.    Currently in Pain? Yes   Pain Location --  hand and knee from 2 recent, but separate falls)   Pain Orientation Right  hand and R knee   Pain Descriptors / Indicators Aching   Pain Type Acute pain   Pain Onset 1 to 4 weeks ago   Pain Frequency Constant   Aggravating Factors  movement of hand   Pain Relieving Factors rest            [facility] PT Assessment - 07/20/16 1157      Assessment   Medical Diagnosis h/o cerebral aneurysm 2011, increasing falls   Referring Provider Minette Brine, MD   Onset Date/Surgical Date --  2011   Prior Therapy known to our clinic from therapy in 2011-2012     Precautions   Precautions Fall   Required Braces  or Orthoses --  wears bioness functional e-stim unit during session     Restrictions   Weight Bearing Restrictions No     Balance Screen   Has the patient fallen in the past 6 months Yes   How many times? 10+   Has the patient had a decrease in activity level because of a fear of falling?  Yes   Is the patient reluctant to leave their home because of a fear of falling?  Yes     [place] Private residence   Living Arrangements Spouse/significant other   Type of [place] to enter   Entrance Stairs-Number of Steps 7   Entrance Stairs-Rails Left   [place] Two level;Able to live on main level with bedroom/bathroom   Alternate Level Stairs-Number of Steps [place] - single point;Shower seat  quad tip;    Additional Comments has elevated commode seats     Prior Function   Level of Independence Independent with basic ADLs  set-up for shirts to get R hand in at times   Vocation On disability     Observation/Other Assessments   Focus on Therapeutic Outcomes (FOTO)  49%   Other Surveys  Other Surveys   Activities of Balance Confidence Scale (ABC Scale)  45.6%     Sensation   Light Touch Impaired Detail   Light Touch Impaired Details Impaired RUE;Impaired RLE  some sensation, but diminished for light touch and pressure     ROM / Strength   AROM / PROM / Strength AROM;Strength     AROM   Overall AROM Comments Maintains 22 degrees R ankle inversion with 20 degrees PF at resting position.  PROM -5 degrees neutral (95 deg PF) AROM to -10 neutral (100 deg PF) .  She can evert the right ankle to neutral from inverted carrying angle.     Strength   Overall Strength Comments 3/5 R hip, 4/5 L hip flexion, 4/5 R knee flexion/ext, 5/5 L knee flex ext, 3/5 R ankle DF with tone noted, and 5/5 L ankle DF.  Flexibility   Soft Tissue Assessment /Muscle Length --  tightness in R heel cord     Transfers   Five time  sit to stand comments  17.53 seconds with 2 losses of balance posteriorly requiring min A to stabilize.     Ambulation/Gait   Ambulation/Gait Yes   Ambulation/Gait Assistance 6: Modified independent (Device/Increase time)   Ambulation Distance (Feet) 150 Feet   Assistive device Straight cane  with quad tip   Gait Pattern Decreased stride length;Decreased dorsiflexion - right;Decreased hip/knee flexion - right;Decreased weight shift to right   Ambulation Surface Level   Gait velocity 2.16 ft/sec with SPC and 1.69 ft/sec without device.   Stairs Yes   Stairs Assistance 6: Modified independent (Device/Increase time)   Stair Management Technique Step to pattern;One rail Left   Number of Stairs 4     Standardized Balance Assessment   Standardized Balance Assessment Berg Balance Test;Timed Up and Go Test     Berg Balance Test   Sit to Stand Able to stand  independently using hands   Standing Unsupported Able to stand safely 2 minutes   Sitting with Back Unsupported but Feet Supported on Floor or Stool Able to sit safely and securely 2 minutes   Stand to Sit Sits safely with minimal use of hands   Transfers Able to transfer safely, definite need of hands   Standing Unsupported with Eyes Closed Able to stand 10 seconds with supervision   Standing Ubsupported with Feet Together Able to place feet together independently and stand for 1 minute with supervision   From Standing, Reach Forward with Outstretched Arm Can reach forward >12 cm safely (5")   From Standing Position, Pick up Object from [place] to pick up shoe, needs supervision   From Standing Position, Turn to Look Behind Over each Shoulder Needs supervision when turning   Turn 360 Degrees Needs close supervision or verbal cueing   Standing Unsupported, Alternately Place Feet on Step/Stool Able to complete >2 steps/needs minimal assist   Standing Unsupported, One Foot in Front Able to take small step independently and hold 30 seconds    Standing on One Leg Unable to try or needs assist to prevent fall   Total Score 35     Timed Up and Go Test   TUG --  17.5 seconds without device       THERAPEUTIC EXERCISE: Standing heel cord stretch, hamstring stretch, sit<>stand x 10 reps       PT Education - 07/21/16 1253    Education provided Yes   Education Details HEP: heel cord stretch, hamstring stretch, sit<>stand   Person(s) Educated Patient   Methods Explanation;Demonstration;Handout   Comprehension Verbalized understanding;Returned demonstration          PT Short Term Goals - 07/21/16 1254      PT SHORT TERM GOAL #1   Title STGs=LTGs           PT Long Term Goals - 07/21/16 1254      PT LONG TERM GOAL #1   Title The patient will be indep with HEP for LE strengthening, balance, and general mobility.   Baseline Target date 09/04/2016   Time 4   Period Weeks     PT LONG TERM GOAL #2   Title The patient will improve Berg from 35/56 up to 42/56 to demo decreasing risk for falls.   Baseline Target date 09/04/2016   Time 4   Period Weeks   Status Partially Met  PT LONG TERM GOAL #3   Title The patient will improve 5 x sit<>stand from 17.53 sec to < or equal to 15 seconds to demo improving LE strength.   Baseline Target date 12/31/017   Time 4   Period Weeks     PT LONG TERM GOAL #4   Title The patient will improve TUG from 17.5 seconds to < or equal to 14 seconds to demo dec'ing risk for falls.   Baseline Target date 09/04/2016   Time 4   Period Weeks     PT LONG TERM GOAL #5   Title The patient will improve gait speed from 2.16 ft/sec to > or equal to 2.5 ft/sec to demo improving community ambulation.   Baseline Target date 09/04/2016   Time 4   Period Weeks     Additional Long Term Goals   Additional Long Term Goals Yes     PT LONG TERM GOAL #6   Title The patient will improve ABC score from 45.6% to > or equal to 55% to demo improving confidence with mobility.   Baseline Target  date 09/04/2016   Time 4   Period Weeks               Plan - 07/21/16 1329    Clinical Impression Statement The patient is a 60 yo female known to our clinic from PT in 2011-2012 after initial cerebral hemorrhage.  Patient notes declining function related to weakness and falls with fear of falling.  She presents today using bioness device- noting orthotics at home that she no longer uses.  She scores in fall risk range for Berg, TUG.  PT to address impairments to optimize current functional status.   Rehab Potential Good   PT Frequency 2x / week   PT Duration 4 weeks  wrote goals to cover 45 days due to holidays, but will go 8 visits total   PT Treatment/Interventions ADLs/Self Care Home Management;Therapeutic activities;Therapeutic exercise;Balance training;Neuromuscular re-education;Patient/family education;Gait training;Orthotic Fit/Training;Functional mobility training;Electrical Stimulation;Manual techniques   PT Next Visit Plan Check HEP; perform R LE strengthening, balance training, gait training.  Have patient bring in AFO's to assess bioness vs AFO.     Consulted and Agree with Plan of Care Patient      Patient will benefit from skilled therapeutic intervention in order to improve the following deficits and impairments:  Abnormal gait, Decreased activity tolerance, Decreased balance, Difficulty walking, Impaired tone, Decreased mobility, Decreased strength, Impaired flexibility  Visit Diagnosis: Other abnormalities of gait and mobility  Unsteadiness on feet  Muscle weakness (generalized)     Problem List Patient Active Problem List   Diagnosis Date Noted  . Aneurysm ([place])   . Brain aneurysm 08/21/2013  . Right foot drop 04/10/2013  . Intracerebral hemorrhage ([place]) 03/19/2013  . Convulsions ([place]) 03/19/2013  . Headache 03/19/2013  . Abnormality of gait 03/19/2013  . Edema 02/23/2011  . COUGH 06/10/2008  . HYPERLIPIDEMIA 04/25/2008  . TOBACCO ABUSE 04/25/2008   . ASTHMA 04/25/2008  . Mila Homer 04/25/2008    Brynlea Spindler, PT 07/21/2016, 1:36 PM  [place] [address] [place], [place], [address] Phone: [phone]   Fax:  [phone]  Name: [name] MRN: [id number] Date of Birth: August 05, 1956

## 2016-07-23 ENCOUNTER — Other Ambulatory Visit: Payer: Self-pay | Admitting: Nurse Practitioner

## 2016-07-26 ENCOUNTER — Ambulatory Visit: Payer: Medicare Other | Admitting: Rehabilitative and Restorative Service Providers"

## 2016-07-26 DIAGNOSIS — R2681 Unsteadiness on feet: Secondary | ICD-10-CM | POA: Diagnosis not present

## 2016-07-26 DIAGNOSIS — M6281 Muscle weakness (generalized): Secondary | ICD-10-CM | POA: Diagnosis not present

## 2016-07-26 DIAGNOSIS — R2689 Other abnormalities of gait and mobility: Secondary | ICD-10-CM

## 2016-07-26 NOTE — Patient Instructions (Signed)
ANKLE: Dorsiflexion (Band)    Sit at edge of surface. Place band around top of foot. Keeping heel on floor, raise toes of banded foot. Hold __5_ seconds. Use _red_ band. _10__ reps per set, _2__ sets per day. Copyright  VHI. All rights reserved.   Practice lifting outer edge of foot with e-stim/ bioness in exercise mode.

## 2016-07-26 NOTE — Therapy (Signed)
[place] [address] [place], [place], [address] Phone: [phone]   Fax:  [phone]  Physical Therapy Treatment  Patient Details  Name: [name] MRN: [id number] Date of Birth: 1956/08/16 Referring Provider: Minette Brine, MD  Encounter Date: 07/26/2016      PT End of Session - 07/26/16 1431    Visit Number 2   Number of Visits 8   Date for PT Re-Evaluation 09/04/16   Authorization Type G code every 10th visit   PT Start Time 0848   PT Stop Time 0933   PT Time Calculation (min) 45 min   Equipment Utilized During Treatment Gait belt   Activity Tolerance Patient tolerated treatment well   Behavior During Therapy [facility] for tasks assessed/performed      Past Medical History:  Diagnosis Date  . Abnormal gait   . Amnesia   . Anemia    hx  . Aneurysm ([place])   . Anxiety   . Asthma   . Atrophic vaginitis   . Benign hypertensive heart disease   . Cardiomegaly   . Chest pain   . COPD (chronic obstructive pulmonary disease) ([place])     - PFTs  01/24/06 FEV1 69% ratio 68% diffusing capacity 57% with no improvement after B2   - PFT's 10/1/ 09 FEV1 77   ratio  76    dlco                   45            no resp to B2    - Nl alpha one antitripsin level 10/09  . Cough   . Depression   . Difficulty speaking   . Disorder of nasal cavity   . Dyspnea   . Enthesopathy of elbow region   . GERD (gastroesophageal reflux disease)   . Hammer toe   . Headache(784.0)   . Hemiparesis affecting dominant side as late effect of cerebrovascular accident ([place])   . History of kidney stones   . Hyperlipidemia   . Hypertension   . Hypothyroidism   . Increased urinary frequency   . Low compliance bladder   . Malaise and fatigue   . Nonruptured cerebral aneurysm   . Obesity   . Pneumonia    hx  . Pure hypercholesterolemia   . Recurrent major depressive episodes ([place])   . Seizure disorder, secondary ([place])    2-3 focal  siezures monthly  . Seizures ([place])   . Short-term memory loss    since stroke  . Skin sensation disturbance   . Stroke [facility]) 2011   rt sided weakness  . Swelling of limb   . TMJ (temporomandibular joint disorder)   . Urinary tract infectious disease   . Vertigo as late effect of stroke   . Vitamin D deficiency     Past Surgical History:  Procedure Laterality Date  . BACK SURGERY    . CEREBRAL ANGIOGRAM     x3  . LAPAROSCOPIC HYSTERECTOMY    . MANDIBLE FRACTURE SURGERY    . RADIOLOGY WITH ANESTHESIA N/A 08/21/2013   Procedure: RADIOLOGY WITH ANESTHESIA;  Surgeon: Rob Hickman, MD;  Location: [place];  Service: Radiology;  Laterality: N/A;  . stones     no surgery    There were no vitals filed for this visit.      Subjective Assessment - 07/26/16 0850    Subjective The patient has had near falls at home,  but no falls.  She has been busy--everyone is coming to her house for Thanksgiving.   She is doing HEP and without questions.  The patient is without bioness today- this is how she walks at home.   Patient Stated Goals Confidence with mobility.    Currently in Pain? Yes   Pain Score --  hand- unrelated to current referral- PT will monitor as needed      THERAPEUTIC EXERCISE: Heel raises x 10 reps off edge of 6" step Step ups R LE with tactile cues to avoid R knee recurvatum Seated ankle dorsiflexion with Red theraband R side Seated ankle eversion *hard to elicit movement- patient reports using bioness at times to elicit contraction Standing alternating LE lunges near support surface with CGA  NEUROMUSCULAR RE-EDUCATION: R weight shifting in standing moving L LE between 6" and 12" surfaces Stride position rocking ant/post emphasizing ankle movements and trunk upright Marching near support surface Backwards walking with cues on longer/equal stride length near support surface  Gait: Treadmill from 1.4 mph up to 1.8 mph and incline 0% up to 3% with bilateral UE  support x 5.5 minutes Gait without AFO today (patient did not wear) with cues on longer stride length, knee extension at heel strike, increased ankle DF at heel strike Performed x 300 feet x 3 reps with cane       PT Education - 07/26/16 1431    Education provided Yes   Education Details HEP: ankle dorsiflexion red band, and using bioness for eversion exercise   Person(s) Educated Patient   Methods Explanation;Demonstration;Handout   Comprehension Verbalized understanding;Returned demonstration          PT Short Term Goals - 07/21/16 1254      PT SHORT TERM GOAL #1   Title STGs=LTGs           PT Long Term Goals - 07/21/16 1254      PT LONG TERM GOAL #1   Title The patient will be indep with HEP for LE strengthening, balance, and general mobility.   Baseline Target date 09/04/2016   Time 4   Period Weeks     PT LONG TERM GOAL #2   Title The patient will improve Berg from 35/56 up to 42/56 to demo decreasing risk for falls.   Baseline Target date 09/04/2016   Time 4   Period Weeks   Status Partially Met     PT LONG TERM GOAL #3   Title The patient will improve 5 x sit<>stand from 17.53 sec to < or equal to 15 seconds to demo improving LE strength.   Baseline Target date 12/31/017   Time 4   Period Weeks     PT LONG TERM GOAL #4   Title The patient will improve TUG from 17.5 seconds to < or equal to 14 seconds to demo dec'ing risk for falls.   Baseline Target date 09/04/2016   Time 4   Period Weeks     PT LONG TERM GOAL #5   Title The patient will improve gait speed from 2.16 ft/sec to > or equal to 2.5 ft/sec to demo improving community ambulation.   Baseline Target date 09/04/2016   Time 4   Period Weeks     Additional Long Term Goals   Additional Long Term Goals Yes     PT LONG TERM GOAL #6   Title The patient will improve ABC score from 45.6% to > or equal to 55% to demo improving confidence with  mobility.   Baseline Target date 09/04/2016   Time 4    Period Weeks               Plan - 07/26/16 1432    Clinical Impression Statement The patient and PT discussed using AFO for more rigorous household activities to reduce falls and improve ankle stability.  Patient to bring to future appointments for PT to assess brace. HEP developed for ankle stability.   PT Treatment/Interventions ADLs/Self Care Home Management;Therapeutic activities;Therapeutic exercise;Balance training;Neuromuscular re-education;Patient/family education;Gait training;Orthotic Fit/Training;Functional mobility training;Electrical Stimulation;Manual techniques   PT Next Visit Plan R LE strengthening, assess bioness, gait training, balance activities.   Consulted and Agree with Plan of Care Patient      Patient will benefit from skilled therapeutic intervention in order to improve the following deficits and impairments:  Abnormal gait, Decreased activity tolerance, Decreased balance, Difficulty walking, Impaired tone, Decreased mobility, Decreased strength, Impaired flexibility  Visit Diagnosis: Other abnormalities of gait and mobility  Unsteadiness on feet  Muscle weakness (generalized)     Problem List Patient Active Problem List   Diagnosis Date Noted  . Aneurysm ([place])   . Brain aneurysm 08/21/2013  . Right foot drop 04/10/2013  . Intracerebral hemorrhage ([place]) 03/19/2013  . Convulsions ([place]) 03/19/2013  . Headache 03/19/2013  . Abnormality of gait 03/19/2013  . Edema 02/23/2011  . COUGH 06/10/2008  . HYPERLIPIDEMIA 04/25/2008  . TOBACCO ABUSE 04/25/2008  . ASTHMA 04/25/2008  . Mila Homer 04/25/2008    Latarra Eagleton, PT 07/26/2016, 2:33 PM  [place] [address] [place], [place], [address] Phone: [phone]   Fax:  [phone]  Name: ELIJAH MICHAELIS MRN: [id number] Date of Birth: 1955/12/16

## 2016-07-27 ENCOUNTER — Other Ambulatory Visit: Payer: Self-pay | Admitting: Podiatry

## 2016-07-27 DIAGNOSIS — K625 Hemorrhage of anus and rectum: Secondary | ICD-10-CM | POA: Diagnosis not present

## 2016-07-27 DIAGNOSIS — Z800 Family history of malignant neoplasm of digestive organs: Secondary | ICD-10-CM | POA: Diagnosis not present

## 2016-07-27 DIAGNOSIS — K5900 Constipation, unspecified: Secondary | ICD-10-CM | POA: Diagnosis not present

## 2016-08-01 ENCOUNTER — Ambulatory Visit: Payer: Medicare Other | Admitting: Rehabilitative and Restorative Service Providers"

## 2016-08-01 DIAGNOSIS — M6281 Muscle weakness (generalized): Secondary | ICD-10-CM

## 2016-08-01 DIAGNOSIS — R2681 Unsteadiness on feet: Secondary | ICD-10-CM | POA: Diagnosis not present

## 2016-08-01 DIAGNOSIS — R2689 Other abnormalities of gait and mobility: Secondary | ICD-10-CM | POA: Diagnosis not present

## 2016-08-01 NOTE — Therapy (Signed)
[facility] [address] [place], [place], [address] Phone: [phone]   Fax:  [phone]  Physical Therapy Treatment  Patient Details  Name: [name] MRN: [id number] Date of Birth: November 27, 1955 Referring Provider: Minette Brine, MD  Encounter Date: 08/01/2016      PT End of Session - 08/01/16 1435    Visit Number 3   Number of Visits 8   Date for PT Re-Evaluation 09/04/16   Authorization Type G code every 10th visit   PT Start Time 1320   PT Stop Time 1402   PT Time Calculation (min) 42 min   Equipment Utilized During Treatment Gait belt   Activity Tolerance Patient tolerated treatment well   Behavior During Therapy [facility] for tasks assessed/performed      Past Medical History:  Diagnosis Date  . Abnormal gait   . Amnesia   . Anemia    hx  . Aneurysm ([place])   . Anxiety   . Asthma   . Atrophic vaginitis   . Benign hypertensive heart disease   . Cardiomegaly   . Chest pain   . COPD (chronic obstructive pulmonary disease) ([place])     - PFTs  01/24/06 FEV1 69% ratio 68% diffusing capacity 57% with no improvement after B2   - PFT's 10/1/ 09 FEV1 77   ratio  76    dlco                   45            no resp to B2    - Nl alpha one antitripsin level 10/09  . Cough   . Depression   . Difficulty speaking   . Disorder of nasal cavity   . Dyspnea   . Enthesopathy of elbow region   . GERD (gastroesophageal reflux disease)   . Hammer toe   . Headache(784.0)   . Hemiparesis affecting dominant side as late effect of cerebrovascular accident ([place])   . History of kidney stones   . Hyperlipidemia   . Hypertension   . Hypothyroidism   . Increased urinary frequency   . Low compliance bladder   . Malaise and fatigue   . Nonruptured cerebral aneurysm   . Obesity   . Pneumonia    hx  . Pure hypercholesterolemia   . Recurrent major depressive episodes ([place])   . Seizure disorder, secondary ([place])    2-3 focal  siezures monthly  . Seizures ([place])   . Short-term memory loss    since stroke  . Skin sensation disturbance   . Stroke [facility]) 2011   rt sided weakness  . Swelling of limb   . TMJ (temporomandibular joint disorder)   . Urinary tract infectious disease   . Vertigo as late effect of stroke   . Vitamin D deficiency     Past Surgical History:  Procedure Laterality Date  . BACK SURGERY    . CEREBRAL ANGIOGRAM     x3  . LAPAROSCOPIC HYSTERECTOMY    . MANDIBLE FRACTURE SURGERY    . RADIOLOGY WITH ANESTHESIA N/A 08/21/2013   Procedure: RADIOLOGY WITH ANESTHESIA;  Surgeon: Rob Hickman, MD;  Location: [facility];  Service: Radiology;  Laterality: N/A;  . stones     no surgery    There were no vitals filed for this visit.      Subjective Assessment - 08/01/16 1322    Subjective The patient wore AFO today and it is  a blue rocker.  She has not worn it in approximately 18 months.  She does feel that she stands taller with brace donned.     Patient Stated Goals Confidence with mobility.    Currently in Pain? Yes   Pain Score --  hand still sore- PT monitoring, but no goal to follow     Gait: Ambulation with blue rocker x 500 feet x 3 reps without device with tactile cues with arm swing and rotation Gait with emphasis on longer stride length and right heel strike with SBA to CGA  NEUROMUSCULAR RE-EDUCATION: Standing R LE stance with L LE moving into IR/ER for single limb stance control x 10 reps Standing partial heel/toe with right LE posterior  THERAPEUTIC EXERCISE: Standing R heel cord stretch with brace off Standing heel raises with UE support x 10 reps with tactile cues Step ups right LE with UE support Standing knee flexion x 10 reps with UE support Foot/toe stretches with flexion/extension and ankle circles        PT Short Term Goals - 07/21/16 1254      PT SHORT TERM GOAL #1   Title STGs=LTGs           PT Long Term Goals - 08/01/16 1331      PT LONG TERM  GOAL #1   Title The patient will be indep with HEP for LE strengthening, balance, and general mobility.   Baseline Target date 09/04/2016   Time 4   Period Weeks   Status On-going     PT LONG TERM GOAL #2   Title The patient will improve Berg from 35/56 up to 42/56 to demo decreasing risk for falls.   Baseline Target date 09/04/2016   Time 4   Period Weeks   Status On-going     PT LONG TERM GOAL #3   Title The patient will improve 5 x sit<>stand from 17.53 sec to < or equal to 15 seconds to demo improving LE strength.   Baseline Target date 12/31/017   Time 4   Period Weeks   Status On-going     PT LONG TERM GOAL #4   Title The patient will improve TUG from 17.5 seconds to < or equal to 14 seconds to demo dec'ing risk for falls.   Baseline Target date 09/04/2016   Time 4   Period Weeks   Status On-going     PT LONG TERM GOAL #5   Title The patient will improve gait speed from 2.16 ft/sec to > or equal to 2.5 ft/sec to demo improving community ambulation.   Baseline Target date 09/04/2016   Time 4   Period Weeks   Status On-going     PT LONG TERM GOAL #6   Title The patient will improve ABC score from 45.6% to > or equal to 55% to demo improving confidence with mobility.   Baseline Target date 09/04/2016   Time 4   Period Weeks   Status On-going               Plan - 08/01/16 1436    Clinical Impression Statement PT recommended the patient wear AFO during times of longer ambulation (shopping, housework, exercise).  She has improved R LE posture.  She does have occasional R knee recurvatum during mid stance during gait with AFO.  PT to continue to work to [employer].    PT Treatment/Interventions ADLs/Self Care Home Management;Therapeutic activities;Therapeutic exercise;Balance training;Neuromuscular re-education;Patient/family education;Gait training;Orthotic Fit/Training;Functional mobility training;Electrical Stimulation;Manual techniques  PT Next Visit Plan R LE  strengthening, assess bioness, gait training, balance activities.   Consulted and Agree with Plan of Care Patient      Patient will benefit from skilled therapeutic intervention in order to improve the following deficits and impairments:  Abnormal gait, Decreased activity tolerance, Decreased balance, Difficulty walking, Impaired tone, Decreased mobility, Decreased strength, Impaired flexibility  Visit Diagnosis: Other abnormalities of gait and mobility  Unsteadiness on feet  Muscle weakness (generalized)     Problem List Patient Active Problem List   Diagnosis Date Noted  . Aneurysm ([place])   . Brain aneurysm 08/21/2013  . Right foot drop 04/10/2013  . Intracerebral hemorrhage ([place]) 03/19/2013  . Convulsions ([place]) 03/19/2013  . Headache 03/19/2013  . Abnormality of gait 03/19/2013  . Edema 02/23/2011  . COUGH 06/10/2008  . HYPERLIPIDEMIA 04/25/2008  . TOBACCO ABUSE 04/25/2008  . ASTHMA 04/25/2008  . Mila Homer 04/25/2008    Lamond Glantz, PT 08/01/2016, 2:39 PM  [facility] [address] [place], [place], [address] Phone: [phone]   Fax:  [phone]  Name: [name] MRN: [id number] Date of Birth: [birth year]

## 2016-08-04 ENCOUNTER — Ambulatory Visit: Payer: Medicare Other | Admitting: Rehabilitative and Restorative Service Providers"

## 2016-08-04 DIAGNOSIS — M6281 Muscle weakness (generalized): Secondary | ICD-10-CM | POA: Diagnosis not present

## 2016-08-04 DIAGNOSIS — R2689 Other abnormalities of gait and mobility: Secondary | ICD-10-CM | POA: Diagnosis not present

## 2016-08-04 DIAGNOSIS — R2681 Unsteadiness on feet: Secondary | ICD-10-CM | POA: Diagnosis not present

## 2016-08-04 NOTE — Patient Instructions (Signed)
Gaze Stabilization - Tip Card  1.Target must remain in focus, not blurry, and appear stationary while head is in motion. 2.Perform exercises with small head movements (45 to either side of midline). 3.Increase speed of head motion so long as target is in focus. 4.If you wear eyeglasses, be sure you can see target through lens (therapist will give specific instructions for bifocal / progressive lenses). 5.These exercises may provoke dizziness or nausea. Work through these symptoms. If too dizzy, slow head movement slightly. Rest between each exercise. 6.Exercises demand concentration; avoid distractions. 7.For safety, perform standing exercises close to a counter, wall, corner, or next to someone.  Copyright  VHI. All rights reserved.   Gaze Stabilization - Standing Feet Apart   Feet shoulder width apart, keeping eyes on target on wall 3 feet away, tilt head down slightly and move head side to side for 30 seconds. Repeat while moving head up and down for 30 seconds. Do 2 sessions per day.   Copyright  VHI. All rights reserved.   Feet Apart, Head Motion - Eyes Open    With eyes open, feet apart, move head slowly: up and down x 10 reps, then side to side x 10 reps. Do 1-2 sessions per day.  Copyright  VHI. All rights reserved.   Feet Partial Heel-Toe, Varied Arm Positions - Eyes Open    With eyes open, right foot partially in front of the other, arms at your side, look straight ahead at a stationary object. Hold _30_ seconds, then switch feet. Repeat __3__ times per session. Do _2___ sessions per day.  Copyright  VHI. All rights reserved.

## 2016-08-04 NOTE — Therapy (Signed)
[facility] [address] [place], [place], [address] Phone: [phone]   Fax:  [phone]  Physical Therapy Treatment  Patient Details  Name: [name] MRN: [id number] Date of Birth: 1956-08-25 Referring Provider: Minette Brine, MD  Encounter Date: 08/04/2016      PT End of Session - 08/04/16 1021    Visit Number 4   Number of Visits 8   Date for PT Re-Evaluation 09/04/16   Authorization Type G code every 10th visit   PT Start Time 1018   PT Stop Time 1100   PT Time Calculation (min) 42 min   Equipment Utilized During Treatment Gait belt   Activity Tolerance Patient tolerated treatment well   Behavior During Therapy [facility] for tasks assessed/performed      Past Medical History:  Diagnosis Date  . Abnormal gait   . Amnesia   . Anemia    hx  . Aneurysm ([place])   . Anxiety   . Asthma   . Atrophic vaginitis   . Benign hypertensive heart disease   . Cardiomegaly   . Chest pain   . COPD (chronic obstructive pulmonary disease) ([place])     - PFTs  01/24/06 FEV1 69% ratio 68% diffusing capacity 57% with no improvement after B2   - PFT's 10/1/ 09 FEV1 77   ratio  76    dlco                   45            no resp to B2    - Nl alpha one antitripsin level 10/09  . Cough   . Depression   . Difficulty speaking   . Disorder of nasal cavity   . Dyspnea   . Enthesopathy of elbow region   . GERD (gastroesophageal reflux disease)   . Hammer toe   . Headache(784.0)   . Hemiparesis affecting dominant side as late effect of cerebrovascular accident ([place])   . History of kidney stones   . Hyperlipidemia   . Hypertension   . Hypothyroidism   . Increased urinary frequency   . Low compliance bladder   . Malaise and fatigue   . Nonruptured cerebral aneurysm   . Obesity   . Pneumonia    hx  . Pure hypercholesterolemia   . Recurrent major depressive episodes ([place])   . Seizure disorder, secondary ([place])    2-3 focal  siezures monthly  . Seizures ([place])   . Short-term memory loss    since stroke  . Skin sensation disturbance   . Stroke [facility]) 2011   rt sided weakness  . Swelling of limb   . TMJ (temporomandibular joint disorder)   . Urinary tract infectious disease   . Vertigo as late effect of stroke   . Vitamin D deficiency     Past Surgical History:  Procedure Laterality Date  . BACK SURGERY    . CEREBRAL ANGIOGRAM     x3  . LAPAROSCOPIC HYSTERECTOMY    . MANDIBLE FRACTURE SURGERY    . RADIOLOGY WITH ANESTHESIA N/A 08/21/2013   Procedure: RADIOLOGY WITH ANESTHESIA;  Surgeon: Rob Hickman, MD;  Location: [facility];  Service: Radiology;  Laterality: N/A;  . stones     no surgery    There were no vitals filed for this visit.      Subjective Assessment - 08/04/16 1020    Subjective The patient notes she wore AFO today, but  changed sneakers and notes not as supportive.   Patient Stated Goals Confidence with mobility.    Pain Score --  hand still sore- from prior fall      NEUROMUSCULAR RE-EDUCATION: Corner balance exercises including eyes open + head motion with staggered foot position and then with feet apart/feet together x 10 reps in horizontal and vertical planes Gaze x 1 adaptation in horizontal and vertical planes with cues on head motion, visual fixation and speed x 20 seconds x 2 reps each direction Wall bumps x 10 reps emphasizing R weight shifting  THERAPEUTIC EXERCISE: Leg press R LE only x 30 lb x 10 reps Elliptical x 1 minute with difficulty moving machine *patient had inquired about due to available equipment at church. Heel raises on leg press R LE only with min A and cues on how to safely perform.   Gait: Gait activities with head motion in horizontal and vertical planes with CGA for safety up to min A x 230 feet Gait without brace donned to mimic home environment between obstacles      PT Education - 08/04/16 1048    Education provided Yes   Education Details  HEP: partial heel/toe, head motion feet apart, standing gaze x 1 viewing   Person(s) Educated Patient   Methods Explanation;Demonstration;Handout   Comprehension Verbalized understanding;Returned demonstration          PT Short Term Goals - 07/21/16 1254      PT SHORT TERM GOAL #1   Title STGs=LTGs           PT Long Term Goals - 08/04/16 1059      PT LONG TERM GOAL #1   Title The patient will be indep with HEP for LE strengthening, balance, and general mobility.   Baseline Met on 08/04/2016   Time 4   Period Weeks   Status Achieved     PT LONG TERM GOAL #2   Title The patient will improve Berg from 35/56 up to 42/56 to demo decreasing risk for falls.   Baseline Target date 09/04/2016   Time 4   Period Weeks   Status On-going     PT LONG TERM GOAL #3   Title The patient will improve 5 x sit<>stand from 17.53 sec to < or equal to 15 seconds to demo improving LE strength.   Baseline Target date 12/31/017   Time 4   Period Weeks   Status On-going     PT LONG TERM GOAL #4   Title The patient will improve TUG from 17.5 seconds to < or equal to 14 seconds to demo dec'ing risk for falls.   Baseline Target date 09/04/2016   Time 4   Period Weeks   Status On-going     PT LONG TERM GOAL #5   Title The patient will improve gait speed from 2.16 ft/sec to > or equal to 2.5 ft/sec to demo improving community ambulation.   Baseline Target date 09/04/2016   Time 4   Period Weeks   Status On-going     PT LONG TERM GOAL #6   Title The patient will improve ABC score from 45.6% to > or equal to 55% to demo improving confidence with mobility.   Baseline Target date 09/04/2016   Time 4   Period Weeks   Status On-going               Plan - 08/04/16 1437    Clinical Impression Statement The patient's HEP is fully  developed with activities for stretching due to ankle tightness from hypertonicity, LE strengthening and now balance activities.  Patient has also discussed  community exercises she has access to via her church.  She is safe to return to leg press, heel raises and walking [facility] [employer].    PT Treatment/Interventions ADLs/Self Care Home Management;Therapeutic activities;Therapeutic exercise;Balance training;Neuromuscular re-education;Patient/family education;Gait training;Orthotic Fit/Training;Functional mobility training;Electrical Stimulation;Manual techniques   PT Next Visit Plan Check HEP, community involvement, dynamic gait/balance with head motion   Consulted and Agree with Plan of Care Patient      Patient will benefit from skilled therapeutic intervention in order to improve the following deficits and impairments:  Abnormal gait, Decreased activity tolerance, Decreased balance, Difficulty walking, Impaired tone, Decreased mobility, Decreased strength, Impaired flexibility  Visit Diagnosis: Other abnormalities of gait and mobility  Unsteadiness on feet  Muscle weakness (generalized)     Problem List Patient Active Problem List   Diagnosis Date Noted  . Aneurysm ([place])   . Brain aneurysm 08/21/2013  . Right foot drop 04/10/2013  . Intracerebral hemorrhage ([place]) 03/19/2013  . Convulsions ([place]) 03/19/2013  . Headache 03/19/2013  . Abnormality of gait 03/19/2013  . Edema 02/23/2011  . COUGH 06/10/2008  . HYPERLIPIDEMIA 04/25/2008  . TOBACCO ABUSE 04/25/2008  . ASTHMA 04/25/2008  . Mila Homer 04/25/2008    Trynity Skousen, PT 08/04/2016, 2:38 PM  [facility] [address] [place], [place], [address] Phone: [phone]   Fax:  [phone]  Name: SVARA TWYMAN MRN: [id number] Date of Birth: Sep 06, 1955

## 2016-08-09 ENCOUNTER — Telehealth (HOSPITAL_COMMUNITY): Payer: Self-pay

## 2016-08-09 ENCOUNTER — Other Ambulatory Visit (HOSPITAL_COMMUNITY): Payer: Self-pay | Admitting: Interventional Radiology

## 2016-08-09 DIAGNOSIS — I609 Nontraumatic subarachnoid hemorrhage, unspecified: Secondary | ICD-10-CM

## 2016-08-09 DIAGNOSIS — I729 Aneurysm of unspecified site: Secondary | ICD-10-CM

## 2016-08-09 NOTE — Telephone Encounter (Signed)
Called to schedule 1 yr f/u MRI, left message for pt to call back. AW

## 2016-08-10 ENCOUNTER — Ambulatory Visit: Payer: Medicare Other | Attending: Nurse Practitioner | Admitting: Rehabilitative and Restorative Service Providers"

## 2016-08-10 DIAGNOSIS — R2689 Other abnormalities of gait and mobility: Secondary | ICD-10-CM | POA: Insufficient documentation

## 2016-08-10 DIAGNOSIS — M6281 Muscle weakness (generalized): Secondary | ICD-10-CM | POA: Diagnosis not present

## 2016-08-10 DIAGNOSIS — R2681 Unsteadiness on feet: Secondary | ICD-10-CM | POA: Diagnosis not present

## 2016-08-10 NOTE — Therapy (Signed)
[facility] [address] [place], [place], [address] Phone: [phone]   Fax:  [phone]  Physical Therapy Treatment  Patient Details  Name: [name] MRN: [id number] Date of Birth: 1955/09/20 Referring Provider: Minette Brine, MD  Encounter Date: 08/10/2016      PT End of Session - 08/10/16 0850    Visit Number 5   Number of Visits 8   Date for PT Re-Evaluation 09/04/16   Authorization Type G code every 10th visit   PT Start Time 0847   PT Stop Time 0930   PT Time Calculation (min) 43 min   Equipment Utilized During Treatment Gait belt   Activity Tolerance Patient tolerated treatment well   Behavior During Therapy [facility] for tasks assessed/performed      Past Medical History:  Diagnosis Date  . Abnormal gait   . Amnesia   . Anemia    hx  . Aneurysm ([place])   . Anxiety   . Asthma   . Atrophic vaginitis   . Benign hypertensive heart disease   . Cardiomegaly   . Chest pain   . COPD (chronic obstructive pulmonary disease) ([place])     - PFTs  01/24/06 FEV1 69% ratio 68% diffusing capacity 57% with no improvement after B2   - PFT's 10/1/ 09 FEV1 77   ratio  76    dlco                   45            no resp to B2    - Nl alpha one antitripsin level 10/09  . Cough   . Depression   . Difficulty speaking   . Disorder of nasal cavity   . Dyspnea   . Enthesopathy of elbow region   . GERD (gastroesophageal reflux disease)   . Hammer toe   . Headache(784.0)   . Hemiparesis affecting dominant side as late effect of cerebrovascular accident ([place])   . History of kidney stones   . Hyperlipidemia   . Hypertension   . Hypothyroidism   . Increased urinary frequency   . Low compliance bladder   . Malaise and fatigue   . Nonruptured cerebral aneurysm   . Obesity   . Pneumonia    hx  . Pure hypercholesterolemia   . Recurrent major depressive episodes ([place])   . Seizure disorder, secondary ([place])    2-3 focal  siezures monthly  . Seizures ([place])   . Short-term memory loss    since stroke  . Skin sensation disturbance   . Stroke [facility]) 2011   rt sided weakness  . Swelling of limb   . TMJ (temporomandibular joint disorder)   . Urinary tract infectious disease   . Vertigo as late effect of stroke   . Vitamin D deficiency     Past Surgical History:  Procedure Laterality Date  . BACK SURGERY    . CEREBRAL ANGIOGRAM     x3  . LAPAROSCOPIC HYSTERECTOMY    . MANDIBLE FRACTURE SURGERY    . RADIOLOGY WITH ANESTHESIA N/A 08/21/2013   Procedure: RADIOLOGY WITH ANESTHESIA;  Surgeon: Rob Hickman, MD;  Location: [facility];  Service: Radiology;  Laterality: N/A;  . stones     no surgery    There were no vitals filed for this visit.      Subjective Assessment - 08/10/16 0849    Subjective The patient did not wear AFO today--she is  doing HEP intermittently-- due to busy getting house ready for holidays.    Patient Stated Goals Confidence with mobility.      THERAPEUTIC EXERCISE: Sit<>stand x 10 Long sitting hamstring stretch Sitting piriformis stretch Sitting butterfly stretch Standing trunk rotation with UEs supported on mat Heel raises x 10 reps off back of 6" step moving to R stance only with UE support  NEUROMUSCULAR RE-EDUCATION: R leg stance activities working on hip control and knee control Weight shifting lifting R toes/L heels with ant/posterior shifting Step ups emphasizing R knee control Alternating foot taps to cones with CGA to min A  Gait: No AFO or device utilized today Obstacle course stepping over 6-8" obstacles with min A (R and then L leg leading) Ambulation emphasizing R heel strike to make initial contact and R knee extension at initial contact        PT Short Term Goals - 07/21/16 1254      PT SHORT TERM GOAL #1   Title STGs=LTGs           PT Long Term Goals - 08/04/16 1059      PT LONG TERM GOAL #1   Title The patient will be indep with HEP for  LE strengthening, balance, and general mobility.   Baseline Met on 08/04/2016   Time 4   Period Weeks   Status Achieved     PT LONG TERM GOAL #2   Title The patient will improve Berg from 35/56 up to 42/56 to demo decreasing risk for falls.   Baseline Target date 09/04/2016   Time 4   Period Weeks   Status On-going     PT LONG TERM GOAL #3   Title The patient will improve 5 x sit<>stand from 17.53 sec to < or equal to 15 seconds to demo improving LE strength.   Baseline Target date 12/31/017   Time 4   Period Weeks   Status On-going     PT LONG TERM GOAL #4   Title The patient will improve TUG from 17.5 seconds to < or equal to 14 seconds to demo dec'ing risk for falls.   Baseline Target date 09/04/2016   Time 4   Period Weeks   Status On-going     PT LONG TERM GOAL #5   Title The patient will improve gait speed from 2.16 ft/sec to > or equal to 2.5 ft/sec to demo improving community ambulation.   Baseline Target date 09/04/2016   Time 4   Period Weeks   Status On-going     PT LONG TERM GOAL #6   Title The patient will improve ABC score from 45.6% to > or equal to 55% to demo improving confidence with mobility.   Baseline Target date 09/04/2016   Time 4   Period Weeks   Status On-going               Plan - 08/10/16 6440    Clinical Impression Statement The patient notes limitations in being able to perform HEP regularly due to medical visits in December, as well as holiday schedule.  PT and patient discussed holding therapy until January if she feels that will allow her to get better carryover to home.  Recommended she continue current HEP t/o the holidays and plan to transition to community exercise in January as well.    PT Treatment/Interventions ADLs/Self Care Home Management;Therapeutic activities;Therapeutic exercise;Balance training;Neuromuscular re-education;Patient/family education;Gait training;Orthotic Fit/Training;Functional mobility training;Electrical  Stimulation;Manual techniques   PT Next Visit Plan  Hold off on further PT until after Christmas due to upcoming medical appointments (MRI, colonoscopy, etc) and patient having difficulty dedicating time to HEP for now.  Anticipate improved compliance after holidays per discussion with patient.   Consulted and Agree with Plan of Care Patient      Patient will benefit from skilled therapeutic intervention in order to improve the following deficits and impairments:  Abnormal gait, Decreased activity tolerance, Decreased balance, Difficulty walking, Impaired tone, Decreased mobility, Decreased strength, Impaired flexibility  Visit Diagnosis: Other abnormalities of gait and mobility  Unsteadiness on feet  Muscle weakness (generalized)     Problem List Patient Active Problem List   Diagnosis Date Noted  . Aneurysm ([place])   . Brain aneurysm 08/21/2013  . Right foot drop 04/10/2013  . Intracerebral hemorrhage ([place]) 03/19/2013  . Convulsions ([place]) 03/19/2013  . Headache 03/19/2013  . Abnormality of gait 03/19/2013  . Edema 02/23/2011  . COUGH 06/10/2008  . HYPERLIPIDEMIA 04/25/2008  . TOBACCO ABUSE 04/25/2008  . ASTHMA 04/25/2008  . Mila Homer 04/25/2008    Harmonie Verrastro, PT 08/10/2016, 2:46 PM  [facility] [address] [place], [place], [address] Phone: [phone]   Fax:  [phone]  Name: [name] MRN: [id number] Date of Birth: 1956-07-08

## 2016-08-11 ENCOUNTER — Telehealth: Payer: Self-pay

## 2016-08-11 NOTE — Telephone Encounter (Signed)
Pt called in to report that she is has a colonoscopy scheduled 08/18/16 w/ Dr. Benson Norway at [facility]. Says that she needs clearance to stop her Plavix 5-7 days prior to the procedure. States that since her stroke, she has had a previous colonoscopy and polyp removal and stopped Plavix at that time. Discussed w/ Katrina RN and advised pt to have clearance form faxed over for Dr. Clydene Fake review/signature.

## 2016-08-12 ENCOUNTER — Ambulatory Visit: Payer: Medicare Other | Admitting: Rehabilitative and Restorative Service Providers"

## 2016-08-12 NOTE — Telephone Encounter (Signed)
LFt vm for [name] at Dr. Benson Norway office for clearance letter. Rn fax letter to [phone]. Letter fax twice. Rn left vm for if clearance letter was not receive. Pt is having procedure on 08/18/2016. Pt has to stop plavix 5 days prior to procedure.

## 2016-08-15 ENCOUNTER — Ambulatory Visit: Payer: Medicare Other | Admitting: Rehabilitative and Restorative Service Providers"

## 2016-08-17 ENCOUNTER — Ambulatory Visit: Payer: Medicare Other | Admitting: Rehabilitative and Restorative Service Providers"

## 2016-08-18 ENCOUNTER — Ambulatory Visit: Payer: Medicare Other | Admitting: Rehabilitative and Restorative Service Providers"

## 2016-08-18 ENCOUNTER — Encounter (HOSPITAL_COMMUNITY)
Admission: RE | Disposition: A | Discharge status: Home / Self Care | Payer: Self-pay | Source: Ambulatory Visit | Attending: Gastroenterology

## 2016-08-18 ENCOUNTER — Other Ambulatory Visit: Payer: Self-pay | Admitting: Gastroenterology

## 2016-08-18 ENCOUNTER — Ambulatory Visit (HOSPITAL_COMMUNITY)
Admission: RE | Admit: 2016-08-18 | Discharge: 2016-08-18 | Disposition: A | Discharge status: Home / Self Care | Payer: Medicare Other | Source: Ambulatory Visit | Attending: Gastroenterology | Admitting: Gastroenterology

## 2016-08-18 ENCOUNTER — Encounter (HOSPITAL_COMMUNITY): Payer: Self-pay | Admitting: *Deleted

## 2016-08-18 DIAGNOSIS — E7800 Pure hypercholesterolemia, unspecified: Secondary | ICD-10-CM | POA: Insufficient documentation

## 2016-08-18 DIAGNOSIS — J449 Chronic obstructive pulmonary disease, unspecified: Secondary | ICD-10-CM | POA: Diagnosis not present

## 2016-08-18 DIAGNOSIS — K5730 Diverticulosis of large intestine without perforation or abscess without bleeding: Secondary | ICD-10-CM | POA: Diagnosis not present

## 2016-08-18 DIAGNOSIS — Z8673 Personal history of transient ischemic attack (TIA), and cerebral infarction without residual deficits: Secondary | ICD-10-CM | POA: Diagnosis not present

## 2016-08-18 DIAGNOSIS — I119 Hypertensive heart disease without heart failure: Secondary | ICD-10-CM | POA: Insufficient documentation

## 2016-08-18 DIAGNOSIS — G40909 Epilepsy, unspecified, not intractable, without status epilepticus: Secondary | ICD-10-CM | POA: Diagnosis not present

## 2016-08-18 DIAGNOSIS — E039 Hypothyroidism, unspecified: Secondary | ICD-10-CM | POA: Insufficient documentation

## 2016-08-18 DIAGNOSIS — K921 Melena: Secondary | ICD-10-CM | POA: Diagnosis not present

## 2016-08-18 DIAGNOSIS — K625 Hemorrhage of anus and rectum: Secondary | ICD-10-CM | POA: Diagnosis not present

## 2016-08-18 DIAGNOSIS — F419 Anxiety disorder, unspecified: Secondary | ICD-10-CM | POA: Insufficient documentation

## 2016-08-18 DIAGNOSIS — E559 Vitamin D deficiency, unspecified: Secondary | ICD-10-CM | POA: Diagnosis not present

## 2016-08-18 DIAGNOSIS — K219 Gastro-esophageal reflux disease without esophagitis: Secondary | ICD-10-CM | POA: Insufficient documentation

## 2016-08-18 DIAGNOSIS — Z87891 Personal history of nicotine dependence: Secondary | ICD-10-CM | POA: Diagnosis not present

## 2016-08-18 DIAGNOSIS — E669 Obesity, unspecified: Secondary | ICD-10-CM | POA: Diagnosis not present

## 2016-08-18 HISTORY — PX: COLONOSCOPY WITH PROPOFOL: SHX5780

## 2016-08-18 SURGERY — COLONOSCOPY WITH PROPOFOL
Anesthesia: Moderate Sedation

## 2016-08-18 MED ORDER — FENTANYL CITRATE (PF) 100 MCG/2ML IJ SOLN
INTRAMUSCULAR | Status: DC | PRN
  Administered 2016-08-18 (×3): 25 ug via INTRAVENOUS

## 2016-08-18 MED ORDER — MIDAZOLAM HCL 10 MG/2ML IJ SOLN
INTRAMUSCULAR | Status: DC | PRN
  Administered 2016-08-18 (×2): 2 mg via INTRAVENOUS
  Administered 2016-08-18: 1 mg via INTRAVENOUS
  Administered 2016-08-18: 2 mg via INTRAVENOUS

## 2016-08-18 MED ORDER — SODIUM CHLORIDE 0.9 % IV SOLN
INTRAVENOUS | Status: DC
  Administered 2016-08-18: 500 mL via INTRAVENOUS

## 2016-08-18 MED ORDER — FENTANYL CITRATE (PF) 100 MCG/2ML IJ SOLN
INTRAMUSCULAR | Status: AC
  Filled 2016-08-18: qty 2

## 2016-08-18 MED ORDER — DIPHENHYDRAMINE HCL 50 MG/ML IJ SOLN
INTRAMUSCULAR | Status: AC
  Filled 2016-08-18: qty 1

## 2016-08-18 MED ORDER — MIDAZOLAM HCL 5 MG/ML IJ SOLN
INTRAMUSCULAR | Status: AC
  Filled 2016-08-18: qty 2

## 2016-08-18 SURGICAL SUPPLY — 22 items

## 2016-08-18 NOTE — H&P (Signed)
[name] HPI: The patient reports intermittent hematochezia and it is associated with her constipation. This is not a new issue as she was evaluated in 2012 with a colonoscopy and last year in the office. She reports some mild proctalgia in the anus with passage of a bowel movement. Additionally, she reports that her maternal aunt, who is only 56 years older than her, was diagnosed with colon cancer in her early 85's. No problems with chest pain or SOB.  Past Medical History:  Diagnosis Date  . Abnormal gait   . Amnesia   . Anemia    hx  . Aneurysm ([place])   . Anxiety   . Asthma   . Atrophic vaginitis   . Benign hypertensive heart disease   . Cardiomegaly   . Chest pain   . COPD (chronic obstructive pulmonary disease) ([place])     - PFTs  01/24/06 FEV1 69% ratio 68% diffusing capacity 57% with no improvement after B2   - PFT's 10/1/ 09 FEV1 77   ratio  76    dlco                   45            no resp to B2    - Nl alpha one antitripsin level 10/09  . Cough   . Depression   . Difficulty speaking   . Disorder of nasal cavity   . Dyspnea   . Enthesopathy of elbow region   . GERD (gastroesophageal reflux disease)   . Hammer toe   . Headache(784.0)   . Hemiparesis affecting dominant side as late effect of cerebrovascular accident ([place])   . History of kidney stones   . Hyperlipidemia   . Hypertension   . Hypothyroidism   . Increased urinary frequency   . Low compliance bladder   . Malaise and fatigue   . Nonruptured cerebral aneurysm   . Obesity   . Pneumonia    hx  . Pure hypercholesterolemia   . Recurrent major depressive episodes ([place])   . Seizure disorder, secondary ([place])    2-3 focal siezures monthly  . Seizures ([place])   . Short-term memory loss    since stroke  . Skin sensation disturbance   . Stroke [facility]) 2011   rt sided weakness  . Swelling of limb   . TMJ (temporomandibular joint disorder)   . Urinary tract infectious disease   . Vertigo as late effect of  stroke   . Vitamin D deficiency     Past Surgical History:  Procedure Laterality Date  . BACK SURGERY    . CEREBRAL ANGIOGRAM     x3  . LAPAROSCOPIC HYSTERECTOMY    . MANDIBLE FRACTURE SURGERY    . RADIOLOGY WITH ANESTHESIA N/A 08/21/2013   Procedure: RADIOLOGY WITH ANESTHESIA;  Surgeon: Rob Hickman, MD;  Location: [place];  Service: Radiology;  Laterality: N/A;  . stones     no surgery    Family History  Problem Relation Age of Onset  . Heart disease Mother   . Heart disease Father   . Heart attack Father   . Lung cancer      Social History:  reports that she quit smoking about 7 years ago. She has never used smokeless tobacco. She reports that she does not drink alcohol or use drugs.  Allergies:  Allergies  Allergen Reactions  . Phenytoin Anaphylaxis, Hives and Swelling    Medications: Scheduled: Continuous:  No results found for this or any previous visit (from the past 24 hour(s)).   No results found.  ROS:  As stated above in the HPI otherwise negative.  There were no vitals taken for this visit.    PE: Gen: NAD, Alert and Oriented HEENT:  [place]/AT, EOMI Neck: Supple, no LAD Lungs: CTA Bilaterally CV: RRR without M/G/R ABM: Soft, NTND, +BS Ext: No C/C/E  Assessment/Plan: 1) Hematochezia. 2) FH of colon cancer.  Plan: 1) Colonoscopy. [name] D 08/18/2016, 12:57 PM

## 2016-08-18 NOTE — Op Note (Signed)
[facility] Patient Name: [name] Procedure Date: 08/18/2016 MRN: [id number] Attending MD: Carol Ada , MD Date of Birth: 1956-01-21 CSN: [id number] Age: 60 Admit Type: Outpatient Procedure:                Colonoscopy Indications:              Hematochezia Providers:                Carol Ada, MD, Laverta Baltimore RN, RN, Corliss Parish, Technician Referring MD:              Medicines:                Fentanyl 75 micrograms IV, Midazolam 7 mg IV Complications:            No immediate complications. Estimated Blood Loss:     Estimated blood loss: none. Procedure:                Pre-Anesthesia Assessment:                           - Prior to the procedure, a History and Physical                            was performed, and patient medications and                            allergies were reviewed. The patient's tolerance of                            previous anesthesia was also reviewed. The risks                            and benefits of the procedure and the sedation                            options and risks were discussed with the patient.                            All questions were answered, and informed consent                            was obtained. Prior Anticoagulants: The patient has                            taken no previous anticoagulant or antiplatelet                            agents. ASA Grade Assessment: IV - A patient with                            severe systemic disease that is a constant threat  to life. After reviewing the risks and benefits,                            the patient was deemed in satisfactory condition to                            undergo the procedure.                           - Sedation was administered by an endoscopy nurse.                            The sedation level attained was moderate.                           After obtaining informed consent,  the colonoscope                            was passed under direct vision. Throughout the                            procedure, the patient's blood pressure, pulse, and                            oxygen saturations were monitored continuously. The                            EC-[id number]) scope was introduced through                            the anus and advanced to the the cecum, identified                            by appendiceal orifice and ileocecal valve. The                            colonoscopy was somewhat difficult due to                            significant looping. Successful completion of the                            procedure was aided by applying abdominal pressure.                            The patient tolerated the procedure well. The                            quality of the bowel preparation was good. The                            ileocecal valve, appendiceal orifice, and rectum  were photographed. Scope In: 3:37:47 PM Scope Out: 4:01:10 PM Scope Withdrawal Time: 0 hours 10 minutes 10 seconds  Total Procedure Duration: 0 hours 23 minutes 23 seconds  Findings:      A few medium-mouthed diverticula were found in the sigmoid colon and       transverse colon. Impression:               - Diverticulosis in the sigmoid colon and in the                            transverse colon.                           - No specimens collected. Moderate Sedation:      Moderate (conscious) sedation was administered by the endoscopy nurse       and supervised by the endoscopist. The following parameters were       monitored: oxygen saturation, heart rate, blood pressure, and response       to care. Recommendation:           - Patient has a contact number available for                            emergencies. The signs and symptoms of potential                            delayed complications were discussed with the                            patient.  Return to normal activities tomorrow.                            Written discharge instructions were provided to the                            patient.                           - Resume previous diet.                           - Continue present medications.                           - Repeat colonoscopy in 10 years for routine                            screening. Procedure Code(s):        --- Professional ---                           [phone], Colonoscopy, flexible; diagnostic, including                            collection of specimen(s) by brushing or washing,  when performed (separate procedure) Diagnosis Code(s):        --- Professional ---                           K92.1, Melena (includes Hematochezia)                           K57.30, Diverticulosis of large intestine without                            perforation or abscess without bleeding CPT copyright 2016 American Medical Association. All rights reserved. The codes documented in this report are preliminary and upon coder review may  be revised to meet current compliance requirements. Carol Ada, MD Carol Ada, MD 08/18/2016 4:09:55 PM This report has been signed electronically. Number of Addenda: 0

## 2016-08-18 NOTE — Discharge Instructions (Signed)

## 2016-08-19 ENCOUNTER — Encounter (HOSPITAL_COMMUNITY): Payer: Self-pay | Admitting: Gastroenterology

## 2016-08-22 ENCOUNTER — Ambulatory Visit: Payer: Medicare Other | Admitting: Rehabilitative and Restorative Service Providers"

## 2016-08-22 ENCOUNTER — Other Ambulatory Visit: Payer: Self-pay | Admitting: Nurse Practitioner

## 2016-08-24 ENCOUNTER — Ambulatory Visit (HOSPITAL_COMMUNITY): Admission: RE | Admit: 2016-08-24 | Payer: Medicare Other | Source: Ambulatory Visit

## 2016-08-24 ENCOUNTER — Ambulatory Visit (HOSPITAL_COMMUNITY)
Admission: RE | Admit: 2016-08-24 | Discharge: 2016-08-24 | Disposition: A | Discharge status: Home / Self Care | Payer: Medicare Other | Source: Ambulatory Visit | Attending: Interventional Radiology | Admitting: Interventional Radiology

## 2016-08-24 DIAGNOSIS — G9389 Other specified disorders of brain: Secondary | ICD-10-CM | POA: Insufficient documentation

## 2016-08-24 DIAGNOSIS — I729 Aneurysm of unspecified site: Secondary | ICD-10-CM

## 2016-08-24 DIAGNOSIS — Z09 Encounter for follow-up examination after completed treatment for conditions other than malignant neoplasm: Secondary | ICD-10-CM | POA: Insufficient documentation

## 2016-08-24 DIAGNOSIS — S066X0A Traumatic subarachnoid hemorrhage without loss of consciousness, initial encounter: Secondary | ICD-10-CM | POA: Diagnosis not present

## 2016-08-24 DIAGNOSIS — I609 Nontraumatic subarachnoid hemorrhage, unspecified: Secondary | ICD-10-CM

## 2016-08-24 LAB — CREATININE, SERUM
Creatinine, Ser: 0.76 mg/dL (ref 0.44–1.00)
GFR calc Af Amer: >60 mL/min (ref >60)
GFR calc non Af Amer: >60 mL/min (ref >60)

## 2016-08-24 MED ORDER — GADOBENATE DIMEGLUMINE 529 MG/ML IV SOLN
15.0000 mL | Freq: Once | INTRAVENOUS | Status: AC
  Administered 2016-08-24: 15 mL via INTRAVENOUS

## 2016-09-07 ENCOUNTER — Ambulatory Visit: Payer: Medicare Other | Attending: Nurse Practitioner | Admitting: Rehabilitative and Restorative Service Providers"

## 2016-09-07 DIAGNOSIS — R2689 Other abnormalities of gait and mobility: Secondary | ICD-10-CM | POA: Diagnosis not present

## 2016-09-07 DIAGNOSIS — M6281 Muscle weakness (generalized): Secondary | ICD-10-CM | POA: Diagnosis not present

## 2016-09-07 DIAGNOSIS — R2681 Unsteadiness on feet: Secondary | ICD-10-CM | POA: Diagnosis not present

## 2016-09-07 NOTE — Patient Instructions (Addendum)
Monday, Wednesday, Friday EXERCISES  Heel Cord Stretch    Place one leg forward, bent, other leg behind and straight. Lean forward keeping back heel flat. Hold __20__ seconds while counting out loud. Repeat with other leg. Repeat __3__ times. Do __2__ sessions per day.  http://gt2.exer.us/512   Copyright  VHI. All rights reserved.   Hamstring Stretch, Seated (Strap, Two Chairs)    Sit with one leg extended onto facing chair. No strap around foot for now*.  Lengthen spine. Hold for __20 seconds. Repeat __3__ times each leg. 2 times/day.   Copyright  VHI. All rights reserved.   Functional Quadriceps: Sit to Stand    Sit on edge of chair, feet flat on floor. Stand upright, extending knees fully.  HAVE A STURDY CHAIR OR ONE PROPPED AGAINST THE WALL. Repeat __5-10__ times per set.  Do __2__ sessions per day.  http://orth.exer.us/735   CAN PLACE RIGHT FOOT BEHIND LEFT (CLOSER TO CHAIR) FOR MORE WEIGHT ON RIGHT SIDE.  Copyright  VHI. All rights reserved.   ANKLE: Dorsiflexion (Band)    Sit at edge of surface. Place band around top of foot. Keeping heel on floor, raise toes of banded foot. Hold __5_ seconds. Use _red_ band. _10__ reps per set, _2__ sets per day. Copyright  VHI. All rights reserved.   ALSO:  Practice lifting outer edge of foot with e-stim/ bioness in exercise mode.   Tuesday, Thursday, Saturday EXERCISES:   Gaze Stabilization - Tip Card  1.Target must remain in focus, not blurry, and appear stationary while head is in motion. 2.Perform exercises with small head movements (45 to either side of midline). 3.Increase speed of head motion so long as target is in focus. 4.If you wear eyeglasses, be sure you can see target through lens (therapist will give specific instructions for bifocal / progressive lenses). 5.These exercises may provoke dizziness or nausea. Work through these symptoms. If too dizzy, slow head movement slightly. Rest between each  exercise. 6.Exercises demand concentration; avoid distractions. 7.For safety, perform standing exercises close to a counter, wall, corner, or next to someone.  Copyright  VHI. All rights reserved.   Gaze Stabilization - Standing Feet Apart   Feet shoulder width apart, keeping eyes on target on wall 3 feet away, tilt head down slightly and move head side to side for 30 seconds. Repeat while moving head up and down for 30 seconds. Do 2 sessions per day.   Copyright  VHI. All rights reserved.   Feet Apart, Head Motion - Eyes Open    With eyes open, feet apart, move head slowly: up and down x 10 reps, then side to side x 10 reps. Do 1-2 sessions per day.  Copyright  VHI. All rights reserved.  Feet Partial Heel-Toe, Varied Arm Positions - Eyes Open    With eyes open, right foot partially in front of the other, arms at your side, look straight ahead at a stationary object. Hold _30_ seconds, then switch feet. Repeat __3__ times per session. Do _2___ sessions per day.  Copyright  VHI. All rights reserved.

## 2016-09-07 NOTE — Therapy (Signed)
[facility] [address] [place], [place], [address] Phone: [phone]   Fax:  [phone]  Physical Therapy Treatment  Patient Details  Name: [name] MRN: [id number] Date of Birth: 1956-05-21 Referring Provider: Arnette Felts, MD  Encounter Date: 09/07/2016      PT End of Session - 09/07/16 1407    Visit Number 6   Number of Visits 8   Date for PT Re-Evaluation 10/08/16   Authorization Type G code every 10th visit   PT Start Time 1020   PT Stop Time 1100   PT Time Calculation (min) 40 min   Equipment Utilized During Treatment Gait belt   Activity Tolerance Patient tolerated treatment well   Behavior During Therapy [facility] for tasks assessed/performed      Past Medical History:  Diagnosis Date  . Abnormal gait   . Amnesia   . Anemia    hx  . Aneurysm ([facility])   . Anxiety   . Asthma   . Atrophic vaginitis   . Benign hypertensive heart disease   . Cardiomegaly   . Chest pain   . COPD (chronic obstructive pulmonary disease) ([facility])     - PFTs  01/24/06 FEV1 69% ratio 68% diffusing capacity 57% with no improvement after B2   - PFT's 10/1/ 09 FEV1 77   ratio  76    dlco                   45            no resp to B2    - Nl alpha one antitripsin level 10/09  . Cough   . Depression   . Difficulty speaking   . Disorder of nasal cavity   . Dyspnea   . Enthesopathy of elbow region   . GERD (gastroesophageal reflux disease)   . Hammer toe   . Headache(784.0)   . Hemiparesis affecting dominant side as late effect of cerebrovascular accident ([facility])   . History of kidney stones   . Hyperlipidemia   . Hypertension   . Hypothyroidism   . Increased urinary frequency   . Low compliance bladder   . Malaise and fatigue   . Nonruptured cerebral aneurysm   . Obesity   . Pneumonia    hx  . Pure hypercholesterolemia   . Recurrent major depressive episodes ([facility])   . Seizure disorder, secondary ([facility])    2-3 focal  siezures monthly  . Seizures ([facility])   . Short-term memory loss    since stroke  . Skin sensation disturbance   . Stroke [facility]) 2011   rt sided weakness  . Swelling of limb   . TMJ (temporomandibular joint disorder)   . Urinary tract infectious disease   . Vertigo as late effect of stroke   . Vitamin D deficiency     Past Surgical History:  Procedure Laterality Date  . BACK SURGERY    . CEREBRAL ANGIOGRAM     x3  . COLONOSCOPY WITH PROPOFOL N/A 08/18/2016   Procedure: COLONOSCOPY WITH PROPOFOL;  Surgeon: Jeani Hawking, MD;  Location: [facility];  Service: Endoscopy;  Laterality: N/A;  . LAPAROSCOPIC HYSTERECTOMY    . MANDIBLE FRACTURE SURGERY    . RADIOLOGY WITH ANESTHESIA N/A 08/21/2013   Procedure: RADIOLOGY WITH ANESTHESIA;  Surgeon: Oneal Grout, MD;  Location: [facility];  Service: Radiology;  Laterality: N/A;  . stones     no surgery    There were  no vitals filed for this visit.      Subjective Assessment - 09/07/16 1020    Subjective The patient notes she got sick during holidays (stomach bug) and was not able to do HEP.  She is returning to PT today after holding since 08/10/16.  The patient reports she fell out of the chair in her bathroom since last session (slid out of her chair)-no injury.   Patient Stated Goals Confidence with mobility.    Currently in Pain? Yes  still has intermittent hand pain R side s/p a fall in 06/2016 (PT to monitor, no goals due to nature of pain/referral)            [facility] PT Assessment - 09/07/16 1021      Transfers   Five time sit to stand comments  17.5 seconds with 2 posterior losses of balance requiring min A.     Ambulation/Gait   Ambulation/Gait Yes   Gait velocity 2.24 ft/sec without device or AFO.     Standardized Balance Assessment   Standardized Balance Assessment Berg Balance Test;Timed Up and Go Test     Berg Balance Test   Sit to Stand Able to stand  independently using hands   Standing Unsupported Able to  stand safely 2 minutes   Sitting with Back Unsupported but Feet Supported on Floor or Stool Able to sit safely and securely 2 minutes   Stand to Sit Sits safely with minimal use of hands   Transfers Able to transfer safely, definite need of hands   Standing Unsupported with Eyes Closed Able to stand 10 seconds safely   Standing Ubsupported with Feet Together Able to place feet together independently and stand for 1 minute with supervision   From Standing, Reach Forward with Outstretched Arm Can reach forward >12 cm safely (5")   From Standing Position, Pick up Object from [place] to pick up shoe safely and easily   From Standing Position, Turn to Look Behind Over each Shoulder Turn sideways only but maintains balance   Turn 360 Degrees Needs close supervision or verbal cueing   Standing Unsupported, Alternately Place Feet on Step/Stool Able to complete >2 steps/needs minimal assist   Standing Unsupported, One Foot in Front Able to plae foot ahead of the other independently and hold 30 seconds   Standing on One Leg Able to lift leg independently and hold 5-10 seconds   Total Score 42   Berg comment: 42/56 improved from initial eval score of 35/56.     Timed Up and Go Test   TUG --  14.19 seconds without device        NEUROMUSCULAR RE-EDUCATION: Reviewed all HEP and divided into 2 groups for improved compliance at home.         [place] Adult PT Treatment/Exercise - 09/07/16 1021      Self-Care   Self-Care Other Self-Care Comments   Other Self-Care Comments  Patient has gym at her church and plans to return to that as her community program (walking track, leg press, arm weights--lat pulls + biceps, bicycle at times, has elliptical)                PT Education - 09/07/16 1058    Education provided Yes   Education Details Reviewed prior HEP (divided into 2 days alternating to perform), discussed community exercise routine.   Person(s) Educated Patient   Methods  Explanation;Demonstration;Handout   Comprehension Verbalized understanding;Returned demonstration  PT Short Term Goals - 07/21/16 1254      PT SHORT TERM GOAL #1   Title STGs=LTGs           PT Long Term Goals - 09/07/16 1408      PT LONG TERM GOAL #1   Title The patient will be indep with HEP for LE strengthening, balance, and general mobility.   Baseline Met on 08/04/2016   Time 4   Period Weeks   Status Achieved     PT LONG TERM GOAL #2   Title The patient will improve Berg from 35/56 up to 42/56 to demo decreasing risk for falls.   Baseline Met scoring 42/56 on 09/07/2016   Time 4   Period Weeks   Status Achieved     PT LONG TERM GOAL #3   Title The patient will improve 5 x sit<>stand from 17.53 sec to < or equal to 15 seconds to demo improving LE strength.   Baseline Revised target date 10/08/2016   Time 4   Period Weeks   Status Revised     PT LONG TERM GOAL #4   Title The patient will improve TUG from 17.5 seconds to < or equal to 14 seconds to demo dec'ing risk for falls.   Baseline Improved to 14.19 seconds on 09/07/2016   Time 4   Period Weeks   Status Partially Met     PT LONG TERM GOAL #5   Title The patient will improve gait speed from 2.16 ft/sec to > or equal to 2.5 ft/sec to demo improving community ambulation.   Baseline Revised target date 10/08/2016   Time 4   Period Weeks   Status Revised     PT LONG TERM GOAL #6   Title The patient will improve ABC score from 45.6% to > or equal to 55% to demo improving confidence with mobility.   Baseline Revised target date 10/08/2016   Time 4   Period Weeks   Status Revised               Plan - 09/07/16 1410    Clinical Impression Statement The patient met 2 LTGs and partially met 1 LTG.  PT to continue working towards the 3 revised LTGs.  Patient made improvement with initial therapy, but noted not able to carryover activities to home due to busy December schedule.  Pt reassessed status to  resume today and is focusing on long term exercise plan.    PT Frequency 2x / week   PT Duration 4 weeks   PT Treatment/Interventions ADLs/Self Care Home Management;Therapeutic activities;Therapeutic exercise;Balance training;Neuromuscular re-education;Patient/family education;Gait training;Orthotic Fit/Training;Functional mobility training;Electrical Stimulation;Manual techniques   PT Next Visit Plan Work on community transition; balance activities in the clinic and overall functional mobility.   Consulted and Agree with Plan of Care Patient      Patient will benefit from skilled therapeutic intervention in order to improve the following deficits and impairments:  Abnormal gait, Decreased activity tolerance, Decreased balance, Difficulty walking, Impaired tone, Decreased mobility, Decreased strength, Impaired flexibility  Visit Diagnosis: Other abnormalities of gait and mobility  Unsteadiness on feet  Muscle weakness (generalized)     Problem List Patient Active Problem List   Diagnosis Date Noted  . Aneurysm ([place])   . Brain aneurysm 08/21/2013  . Right foot drop 04/10/2013  . Intracerebral hemorrhage ([place]) 03/19/2013  . Convulsions ([place]) 03/19/2013  . Headache 03/19/2013  . Abnormality of gait 03/19/2013  . Edema 02/23/2011  . COUGH  06/10/2008  . HYPERLIPIDEMIA 04/25/2008  . TOBACCO ABUSE 04/25/2008  . ASTHMA 04/25/2008  . [place] 04/25/2008    Talor Cheema, PT 09/07/2016, 2:12 PM  [place] [facility] [address] [place], [place], [address] Phone: [phone]   Fax:  [phone]  Name: [name] MRN: [id number] Date of Birth: [birth year]

## 2016-09-09 ENCOUNTER — Ambulatory Visit: Payer: Medicare Other | Admitting: Rehabilitative and Restorative Service Providers"

## 2016-09-09 DIAGNOSIS — M6281 Muscle weakness (generalized): Secondary | ICD-10-CM | POA: Diagnosis not present

## 2016-09-09 DIAGNOSIS — R2689 Other abnormalities of gait and mobility: Secondary | ICD-10-CM | POA: Diagnosis not present

## 2016-09-09 DIAGNOSIS — R2681 Unsteadiness on feet: Secondary | ICD-10-CM

## 2016-09-09 NOTE — Therapy (Signed)
[facility] [address] [place], [place], [address] Phone: [phone]   Fax:  [phone]  Physical Therapy Treatment  Patient Details  Name: [name] MRN: [id number] Date of Birth: Nov 23, 1955 Referring Provider: Minette Brine, MD  Encounter Date: 09/09/2016      PT End of Session - 09/09/16 1027    Visit Number 7   Number of Visits 8   Date for PT Re-Evaluation 10/08/16   Authorization Type G code every 10th visit   PT Start Time 1025   PT Stop Time 1105   PT Time Calculation (min) 40 min   Equipment Utilized During Treatment Gait belt   Activity Tolerance Patient tolerated treatment well   Behavior During Therapy [facility] for tasks assessed/performed      Past Medical History:  Diagnosis Date  . Abnormal gait   . Amnesia   . Anemia    hx  . Aneurysm ([place])   . Anxiety   . Asthma   . Atrophic vaginitis   . Benign hypertensive heart disease   . Cardiomegaly   . Chest pain   . COPD (chronic obstructive pulmonary disease) ([place])     - PFTs  01/24/06 FEV1 69% ratio 68% diffusing capacity 57% with no improvement after B2   - PFT's 10/1/ 09 FEV1 77   ratio  76    dlco                   45            no resp to B2    - Nl alpha one antitripsin level 10/09  . Cough   . Depression   . Difficulty speaking   . Disorder of nasal cavity   . Dyspnea   . Enthesopathy of elbow region   . GERD (gastroesophageal reflux disease)   . Hammer toe   . Headache(784.0)   . Hemiparesis affecting dominant side as late effect of cerebrovascular accident ([place])   . History of kidney stones   . Hyperlipidemia   . Hypertension   . Hypothyroidism   . Increased urinary frequency   . Low compliance bladder   . Malaise and fatigue   . Nonruptured cerebral aneurysm   . Obesity   . Pneumonia    hx  . Pure hypercholesterolemia   . Recurrent major depressive episodes ([place])   . Seizure disorder, secondary ([place])    2-3 focal  siezures monthly  . Seizures ([place])   . Short-term memory loss    since stroke  . Skin sensation disturbance   . Stroke [facility]) 2011   rt sided weakness  . Swelling of limb   . TMJ (temporomandibular joint disorder)   . Urinary tract infectious disease   . Vertigo as late effect of stroke   . Vitamin D deficiency     Past Surgical History:  Procedure Laterality Date  . BACK SURGERY    . CEREBRAL ANGIOGRAM     x3  . COLONOSCOPY WITH PROPOFOL N/A 08/18/2016   Procedure: COLONOSCOPY WITH PROPOFOL;  Surgeon: Carol Ada, MD;  Location: [facility];  Service: Endoscopy;  Laterality: N/A;  . LAPAROSCOPIC HYSTERECTOMY    . MANDIBLE FRACTURE SURGERY    . RADIOLOGY WITH ANESTHESIA N/A 08/21/2013   Procedure: RADIOLOGY WITH ANESTHESIA;  Surgeon: Rob Hickman, MD;  Location: [facility];  Service: Radiology;  Laterality: N/A;  . stones     no surgery    There were  no vitals filed for this visit.      Subjective Assessment - 09/09/16 1025    Subjective The patient was not able to go to gym due to babysitting grandkids yesterday due to school closure.  She did some HEP.    Patient Stated Goals Confidence with mobility.    Currently in Pain? --  See evaluation--no PT intervention indicated                         [facility] Adult PT Treatment/Exercise - 09/09/16 1038      Ambulation/Gait   Ambulation/Gait Yes   Ambulation Distance (Feet) 400 Feet   Assistive device None  without AFO   Ambulation Surface Level   Gait Comments cues to increase stride length, arm swing, R heel strike (verbal and tactile)  Dynamic gait activities with head motion side to side, vertical, marching all with CGA to min A.  Backwards walking with mn A.      Neuro Re-ed    Neuro Re-ed Details  Moving R LE between 6 and 12" surfaces with UE support and CGA.  Lunges with min A near support surface as needed.      Exercises   Exercises Knee/Hip;Other Exercises   Other Exercises  R LE stance  strengthening moving treadmill belt with R LE (to emphasize power during stance phase).  Seated hamstring pulls on stool x 40 feet.   Standing heel raises with tactile cues on R ankle positioning.      Knee/Hip Exercises: Stretches   Active Hamstring Stretch Both;60 seconds   Active Hamstring Stretch Limitations seated with one leg on mat   Other Knee/Hip Stretches seated hip adductor stretch one leg flexed and other extended x 30 seconds x 2 reps each side, butterfly stretch seated, deep hip rotator stretch sitting (legs crossed)     Knee/Hip Exercises: Standing   Forward Step Up 10 reps   Forward Step Up Limitations Cues for R knee control to prevent genu recurvatum and with cues on hip placement and using bilateral UEs   Wall Squat 10 reps   Wall Squat Limitations with L heel raised to increase R weight shifting and CGA for safety at R knee    Other Standing Knee Exercises Sit<>stand with "bumps" to support surface x 5 reps with CGA and cues on ankle positioning.                  PT Short Term Goals - 07/21/16 1254      PT SHORT TERM GOAL #1   Title STGs=LTGs           PT Long Term Goals - 09/07/16 1408      PT LONG TERM GOAL #1   Title The patient will be indep with HEP for LE strengthening, balance, and general mobility.   Baseline Met on 08/04/2016   Time 4   Period Weeks   Status Achieved     PT LONG TERM GOAL #2   Title The patient will improve Berg from 35/56 up to 42/56 to demo decreasing risk for falls.   Baseline Met scoring 42/56 on 09/07/2016   Time 4   Period Weeks   Status Achieved     PT LONG TERM GOAL #3   Title The patient will improve 5 x sit<>stand from 17.53 sec to < or equal to 15 seconds to demo improving LE strength.   Baseline Revised target date 10/08/2016   Time 4  Period Weeks   Status Revised     PT LONG TERM GOAL #4   Title The patient will improve TUG from 17.5 seconds to < or equal to 14 seconds to demo dec'ing risk for falls.    Baseline Improved to 14.19 seconds on 09/07/2016   Time 4   Period Weeks   Status Partially Met     PT LONG TERM GOAL #5   Title The patient will improve gait speed from 2.16 ft/sec to > or equal to 2.5 ft/sec to demo improving community ambulation.   Baseline Revised target date 10/08/2016   Time 4   Period Weeks   Status Revised     PT LONG TERM GOAL #6   Title The patient will improve ABC score from 45.6% to > or equal to 55% to demo improving confidence with mobility.   Baseline Revised target date 10/08/2016   Time 4   Period Weeks   Status Revised               Plan - 09/09/16 1106    Clinical Impression Statement The patient demonstrates improvement with gait mechanics after stretching.  Focus of PT at this time is to improve carryover to home to establish ongoing community routine.    PT Treatment/Interventions ADLs/Self Care Home Management;Therapeutic activities;Therapeutic exercise;Balance training;Neuromuscular re-education;Patient/family education;Gait training;Orthotic Fit/Training;Functional mobility training;Electrical Stimulation;Manual techniques   PT Next Visit Plan Work on community transition; balance activities in the clinic and overall functional mobility.  DISCHARGE WEEK OF 09/12/14   Consulted and Agree with Plan of Care Patient      Patient will benefit from skilled therapeutic intervention in order to improve the following deficits and impairments:  Abnormal gait, Decreased activity tolerance, Decreased balance, Difficulty walking, Impaired tone, Decreased mobility, Decreased strength, Impaired flexibility  Visit Diagnosis: Other abnormalities of gait and mobility  Unsteadiness on feet  Muscle weakness (generalized)     Problem List Patient Active Problem List   Diagnosis Date Noted  . Aneurysm ([place])   . Brain aneurysm 08/21/2013  . Right foot drop 04/10/2013  . Intracerebral hemorrhage ([place]) 03/19/2013  . Convulsions ([place]) 03/19/2013  .  Headache 03/19/2013  . Abnormality of gait 03/19/2013  . Edema 02/23/2011  . COUGH 06/10/2008  . HYPERLIPIDEMIA 04/25/2008  . TOBACCO ABUSE 04/25/2008  . ASTHMA 04/25/2008  . Mila Homer 04/25/2008    Glyn Gerads, PT 09/09/2016, 11:09 AM  [facility] [address] [place], [place], [address] Phone: [phone]   Fax:  [phone]  Name: [name] MRN: [id number] Date of Birth: 1956/02/18

## 2016-09-13 ENCOUNTER — Other Ambulatory Visit (HOSPITAL_COMMUNITY): Payer: Self-pay | Admitting: Interventional Radiology

## 2016-09-13 DIAGNOSIS — I729 Aneurysm of unspecified site: Secondary | ICD-10-CM

## 2016-09-13 DIAGNOSIS — I609 Nontraumatic subarachnoid hemorrhage, unspecified: Secondary | ICD-10-CM

## 2016-09-14 ENCOUNTER — Ambulatory Visit: Payer: Medicare Other | Admitting: Rehabilitative and Restorative Service Providers"

## 2016-09-14 DIAGNOSIS — R2689 Other abnormalities of gait and mobility: Secondary | ICD-10-CM

## 2016-09-14 DIAGNOSIS — M6281 Muscle weakness (generalized): Secondary | ICD-10-CM | POA: Diagnosis not present

## 2016-09-14 DIAGNOSIS — R2681 Unsteadiness on feet: Secondary | ICD-10-CM | POA: Diagnosis not present

## 2016-09-14 NOTE — Patient Instructions (Signed)
Step Up    Standing left leg leads up to the bottom step.  Will follow "up left/up right, down left/down right". Repeat __10-20__ times. Do __1__ sessions per day.  Have help the first couple of times you perform for safety. http://gt2.exer.us/720   Copyright  VHI. All rights reserved.

## 2016-09-14 NOTE — Therapy (Signed)
[facility] [address] [place], [place], [address] Phone: [phone]   Fax:  [phone]  Physical Therapy Treatment  Patient Details  Name: [name] MRN: [id number] Date of Birth: [birth year] Referring Provider: Minette Brine, MD  Encounter Date: 09/14/2016      PT End of Session - 09/14/16 0935    Visit Number 8   Number of Visits 8   Date for PT Re-Evaluation 10/08/16   Authorization Type G code every 10th visit   PT Start Time 0931   PT Stop Time 1013   PT Time Calculation (min) 42 min   Equipment Utilized During Treatment Gait belt   Activity Tolerance Patient tolerated treatment well   Behavior During Therapy [facility] for tasks assessed/performed      Past Medical History:  Diagnosis Date  . Abnormal gait   . Amnesia   . Anemia    hx  . Aneurysm ([place])   . Anxiety   . Asthma   . Atrophic vaginitis   . Benign hypertensive heart disease   . Cardiomegaly   . Chest pain   . COPD (chronic obstructive pulmonary disease) ([place])     - PFTs  01/24/06 FEV1 69% ratio 68% diffusing capacity 57% with no improvement after B2   - PFT's 10/1/ 09 FEV1 77   ratio  76    dlco                   45            no resp to B2    - Nl alpha one antitripsin level 10/09  . Cough   . Depression   . Difficulty speaking   . Disorder of nasal cavity   . Dyspnea   . Enthesopathy of elbow region   . GERD (gastroesophageal reflux disease)   . Hammer toe   . Headache(784.0)   . Hemiparesis affecting dominant side as late effect of cerebrovascular accident ([place])   . History of kidney stones   . Hyperlipidemia   . Hypertension   . Hypothyroidism   . Increased urinary frequency   . Low compliance bladder   . Malaise and fatigue   . Nonruptured cerebral aneurysm   . Obesity   . Pneumonia    hx  . Pure hypercholesterolemia   . Recurrent major depressive episodes ([place])   . Seizure disorder, secondary ([place])    2-3 focal  siezures monthly  . Seizures ([place])   . Short-term memory loss    since stroke  . Skin sensation disturbance   . Stroke [facility]) 2011   rt sided weakness  . Swelling of limb   . TMJ (temporomandibular joint disorder)   . Urinary tract infectious disease   . Vertigo as late effect of stroke   . Vitamin D deficiency     Past Surgical History:  Procedure Laterality Date  . BACK SURGERY    . CEREBRAL ANGIOGRAM     x3  . COLONOSCOPY WITH PROPOFOL N/A 08/18/2016   Procedure: COLONOSCOPY WITH PROPOFOL;  Surgeon: Carol Ada, MD;  [facility];  Service: Endoscopy;  Laterality: N/A;  . LAPAROSCOPIC HYSTERECTOMY    . MANDIBLE FRACTURE SURGERY    . RADIOLOGY WITH ANESTHESIA N/A 08/21/2013   Procedure: RADIOLOGY WITH ANESTHESIA;  Surgeon: Rob Hickman, MD;  Location: [facility];  Service: Radiology;  Laterality: N/A;  . stones     no surgery    There were  no vitals filed for this visit.      Subjective Assessment - 09/14/16 0934    Subjective The patient has not been able to get to gym due to family illness and dad in hospital.  Unexpected life occurrences appear to hinder her ability to participate with commnity exercise program.    Patient Stated Goals Confidence with mobility.    Currently in Pain? No/denies  See PT eval-PT not addressing                         [facility] Adult PT Treatment/Exercise - 09/14/16 5366      Ambulation/Gait   Ambulation/Gait Yes   Ramp 4: Min assist   Ramp Details (indicate cue type and reason) with cues on correct sequencing and intermittent CGA.   Curb 4: Min assist   Curb Details (indicate cue type and reason) with cues on correct sequencing and intermittent CGA   Gait Comments Treadmill x 10 minutes with bilateral UE support moving from 1.0 mph up to 2.4 mph with SBA and verbal cues to contract in right hip during R stance phase *this reduces R knee recurvatum.  Gait with SPC on level surfaces with CGA with 3 episodes of R  foot catching, but patient self recovers.  Patient is wearing R blue rocker today.       Self-Care   Self-Care Other Self-Care Comments   Other Self-Care Comments  home progra/community wellness     Neuro Re-ed    Neuro Re-ed Details  Standing balance on R LE with L LE supported on 6" curb.  Moving R foot onto/off of 6" step.      Knee/Hip Exercises: Stretches   Active Hamstring Stretch Both;60 seconds   Active Hamstring Stretch Limitations seated   Other Knee/Hip Stretches seated hip adductor stretch one leg flexed and other extended x 30 seconds x 2 reps each side, butterfly stretch seated, deep hip rotator stretch sitting (legs crossed)     Knee/Hip Exercises: Standing   Forward Step Up 10 reps   Forward Step Up Limitations "up/up down/down" with left leg leading discussing holding L UE if performing in home environment.  Provided for current HEP.                PT Education - 09/14/16 1025    Education provided Yes   Education Details recommended f/u in 2-3 weeks after trying community activities; added one exercise to work on stability for curb negotiation.   Person(s) Educated Patient   Methods Explanation;Demonstration;Handout   Comprehension Verbalized understanding;Returned demonstration          PT Short Term Goals - 07/21/16 1254      PT SHORT TERM GOAL #1   Title STGs=LTGs           PT Long Term Goals - 09/14/16 1032      PT LONG TERM GOAL #1   Title The patient will be indep with HEP for LE strengthening, balance, and general mobility.   Baseline Met on 08/04/2016   Time 4   Period Weeks   Status Achieved     PT LONG TERM GOAL #2   Title The patient will improve Berg from 35/56 up to 42/56 to demo decreasing risk for falls.   Baseline Met scoring 42/56 on 09/07/2016   Time 4   Period Weeks   Status Achieved     PT LONG TERM GOAL #3   Title The patient will improve  5 x sit<>stand from 17.53 sec to < or equal to 15 seconds to demo improving  LE strength.   Baseline Revised target date 10/08/2016   Time 4   Period Weeks   Status Revised     PT LONG TERM GOAL #4   Title The patient will improve TUG from 17.5 seconds to < or equal to 14 seconds to demo dec'ing risk for falls.   Baseline Improved to 14.19 seconds on 09/07/2016   Time 4   Period Weeks   Status Partially Met     PT LONG TERM GOAL #5   Title The patient will improve gait speed from 2.16 ft/sec to > or equal to 2.5 ft/sec to demo improving community ambulation.   Baseline Revised target date 10/08/2016   Time 4   Period Weeks   Status Revised     PT LONG TERM GOAL #6   Title The patient will improve ABC score from 45.6% to > or equal to 55% to demo improving confidence with mobility.   Baseline Patient improved to 56.3% on 09/14/2016   Time 4   Period Weeks   Status Achieved               Plan - 09/14/16 1033    Clinical Impression Statement The patient met LTG to improve activities of balance confidence.  PT changed her d/c date to allow for gym time to ensure community program going well.  Patient continues to have barriers to attend including:  family illness, caring intermittently for grandkids.   PT Next Visit Plan Check LTGs, d/c next visit with emphasis on community routine.    Consulted and Agree with Plan of Care Patient      Patient will benefit from skilled therapeutic intervention in order to improve the following deficits and impairments:  Abnormal gait, Decreased activity tolerance, Decreased balance, Difficulty walking, Impaired tone, Decreased mobility, Decreased strength, Impaired flexibility  Visit Diagnosis: Other abnormalities of gait and mobility  Unsteadiness on feet  Muscle weakness (generalized)     Problem List Patient Active Problem List   Diagnosis Date Noted  . Aneurysm ([place])   . Brain aneurysm 08/21/2013  . Right foot drop 04/10/2013  . Intracerebral hemorrhage ([place]) 03/19/2013  . Convulsions ([place]) 03/19/2013   . Headache 03/19/2013  . Abnormality of gait 03/19/2013  . Edema 02/23/2011  . COUGH 06/10/2008  . HYPERLIPIDEMIA 04/25/2008  . TOBACCO ABUSE 04/25/2008  . ASTHMA 04/25/2008  . Mila Homer 04/25/2008    Zykiria Bruening , PT 09/14/2016, 10:35 AM  [facility] [address] [place], [place], [address] Phone: [phone]   Fax:  [phone]  Name: KAYLEIGH BROADWELL MRN: [id number] Date of Birth: April 26, 1956

## 2016-09-16 ENCOUNTER — Ambulatory Visit: Payer: Medicare Other | Admitting: Rehabilitative and Restorative Service Providers"

## 2016-09-21 ENCOUNTER — Ambulatory Visit (HOSPITAL_COMMUNITY): Admission: RE | Admit: 2016-09-21 | Payer: Medicare Other | Source: Ambulatory Visit

## 2016-09-23 DIAGNOSIS — H02831 Dermatochalasis of right upper eyelid: Secondary | ICD-10-CM | POA: Diagnosis not present

## 2016-09-23 DIAGNOSIS — H02832 Dermatochalasis of right lower eyelid: Secondary | ICD-10-CM | POA: Diagnosis not present

## 2016-09-23 DIAGNOSIS — H52223 Regular astigmatism, bilateral: Secondary | ICD-10-CM | POA: Diagnosis not present

## 2016-09-23 DIAGNOSIS — H524 Presbyopia: Secondary | ICD-10-CM | POA: Diagnosis not present

## 2016-09-23 DIAGNOSIS — H353131 Nonexudative age-related macular degeneration, bilateral, early dry stage: Secondary | ICD-10-CM | POA: Diagnosis not present

## 2016-09-23 DIAGNOSIS — H2513 Age-related nuclear cataract, bilateral: Secondary | ICD-10-CM | POA: Diagnosis not present

## 2016-10-03 ENCOUNTER — Ambulatory Visit (HOSPITAL_COMMUNITY)
Admission: RE | Admit: 2016-10-03 | Discharge: 2016-10-03 | Disposition: A | Discharge status: Home / Self Care | Payer: Medicare Other | Source: Ambulatory Visit | Attending: Interventional Radiology | Admitting: Interventional Radiology

## 2016-10-03 ENCOUNTER — Ambulatory Visit: Payer: Medicare Other | Admitting: Rehabilitative and Restorative Service Providers"

## 2016-10-03 DIAGNOSIS — M6281 Muscle weakness (generalized): Secondary | ICD-10-CM | POA: Diagnosis not present

## 2016-10-03 DIAGNOSIS — R2689 Other abnormalities of gait and mobility: Secondary | ICD-10-CM | POA: Diagnosis not present

## 2016-10-03 DIAGNOSIS — I671 Cerebral aneurysm, nonruptured: Secondary | ICD-10-CM | POA: Diagnosis not present

## 2016-10-03 DIAGNOSIS — I729 Aneurysm of unspecified site: Secondary | ICD-10-CM | POA: Insufficient documentation

## 2016-10-03 DIAGNOSIS — R2681 Unsteadiness on feet: Secondary | ICD-10-CM

## 2016-10-03 DIAGNOSIS — G9389 Other specified disorders of brain: Secondary | ICD-10-CM | POA: Insufficient documentation

## 2016-10-03 DIAGNOSIS — I609 Nontraumatic subarachnoid hemorrhage, unspecified: Secondary | ICD-10-CM

## 2016-10-03 NOTE — Therapy (Signed)
[facility] [address] [place], [place], [address] Phone: [phone]   Fax:  [phone]  Physical Therapy Treatment and DISCHARGE summary  Patient Details  Name: [name] MRN: [id number] Date of Birth: [birth year] Referring Provider: Minette Brine, MD  Encounter Date: 10/03/2016      PT End of Session - 10/03/16 1018    Visit Number 9   Date for PT Re-Evaluation 10/08/16   Authorization Type G code every 10th visit   PT Start Time 0935   PT Stop Time 1015   PT Time Calculation (min) 40 min   Activity Tolerance Patient tolerated treatment well   Behavior During Therapy [facility] for tasks assessed/performed      Past Medical History:  Diagnosis Date  . Abnormal gait   . Amnesia   . Anemia    hx  . Aneurysm ([place])   . Anxiety   . Asthma   . Atrophic vaginitis   . Benign hypertensive heart disease   . Cardiomegaly   . Chest pain   . COPD (chronic obstructive pulmonary disease) ([place])     - PFTs  01/24/06 FEV1 69% ratio 68% diffusing capacity 57% with no improvement after B2   - PFT's 10/1/ 09 FEV1 77   ratio  76    dlco                   45            no resp to B2    - Nl alpha one antitripsin level 10/09  . Cough   . Depression   . Difficulty speaking   . Disorder of nasal cavity   . Dyspnea   . Enthesopathy of elbow region   . GERD (gastroesophageal reflux disease)   . Hammer toe   . Headache(784.0)   . Hemiparesis affecting dominant side as late effect of cerebrovascular accident ([place])   . History of kidney stones   . Hyperlipidemia   . Hypertension   . Hypothyroidism   . Increased urinary frequency   . Low compliance bladder   . Malaise and fatigue   . Nonruptured cerebral aneurysm   . Obesity   . Pneumonia    hx  . Pure hypercholesterolemia   . Recurrent major depressive episodes ([place])   . Seizure disorder, secondary ([place])    2-3 focal siezures monthly  . Seizures ([place])   .  Short-term memory loss    since stroke  . Skin sensation disturbance   . Stroke [facility]) 2011   rt sided weakness  . Swelling of limb   . TMJ (temporomandibular joint disorder)   . Urinary tract infectious disease   . Vertigo as late effect of stroke   . Vitamin D deficiency     Past Surgical History:  Procedure Laterality Date  . BACK SURGERY    . CEREBRAL ANGIOGRAM     x3  . COLONOSCOPY WITH PROPOFOL N/A 08/18/2016   Procedure: COLONOSCOPY WITH PROPOFOL;  Surgeon: Carol Ada, MD;  Location: [facility];  Service: Endoscopy;  Laterality: N/A;  . LAPAROSCOPIC HYSTERECTOMY    . MANDIBLE FRACTURE SURGERY    . RADIOLOGY WITH ANESTHESIA N/A 08/21/2013   Procedure: RADIOLOGY WITH ANESTHESIA;  Surgeon: Rob Hickman, MD;  Location: [facility];  Service: Radiology;  Laterality: N/A;  . stones     no surgery    There were no vitals filed for this visit.  Subjective Assessment - 10/03/16 0936    Subjective The patient is participating in home program more consistently, however is not doing community routine at this time.  She is using treadmill regularly at home.    Patient Stated Goals Confidence with mobility.    Currently in Pain? No/denies                         [facility] Adult PT Treatment/Exercise - 10/03/16 1020      Transfers   Five time sit to stand comments  10.78 seconds     Ambulation/Gait   Ambulation/Gait Yes   Ambulation/Gait Assistance 6: Modified independent (Device/Increase time)   Ambulation Distance (Feet) 400 Feet   Assistive device None;Straight cane   Ambulation Surface Level   Gait velocity 2.61 ft/sec   Gait Comments Patient ambulates without AFO today.       High Level Balance   High Level Balance Comments Corner balance exercises on solid surfaces with head motion.  Also performed gaze x 1 viewing with standing feet apart.       Exercises   Exercises Other Exercises   Other Exercises  Seated hip stretching including straddle  stretch, circle/butterfly stretching, long sitting stretch and deep hip stretch sitting with legs crossed and trunk flexion.                 PT Education - 10/03/16 1018    Education provided Yes   Education Details discussed long term plan and "forever exercise" nature of activities to maintain mobility post stroke to manage chronic deficits   Person(s) Educated Patient   Methods Explanation   Comprehension Verbalized understanding          PT Short Term Goals - 07/21/16 1254      PT SHORT TERM GOAL #1   Title STGs=LTGs           PT Long Term Goals - 10/03/16 0957      PT LONG TERM GOAL #1   Title The patient will be indep with HEP for LE strengthening, balance, and general mobility.   Baseline Met on 08/04/2016   Time 4   Period Weeks   Status Achieved     PT LONG TERM GOAL #2   Title The patient will improve Berg from 35/56 up to 42/56 to demo decreasing risk for falls.   Baseline Met scoring 42/56 on 09/07/2016   Time 4   Period Weeks   Status Achieved     PT LONG TERM GOAL #3   Title The patient will improve 5 x sit<>stand from 17.53 sec to < or equal to 15 seconds to demo improving LE strength.   Baseline Improved to 10.78 seconds with a posterior lean backwards (self recovered) x 5 reps without UE support on 10/03/2016   Time 4   Period Weeks   Status Achieved     PT LONG TERM GOAL #4   Title The patient will improve TUG from 17.5 seconds to < or equal to 14 seconds to demo dec'ing risk for falls.   Baseline Improved to 14.19 seconds on 09/07/2016   Time 4   Period Weeks   Status Partially Met     PT LONG TERM GOAL #5   Title The patient will improve gait speed from 2.16 ft/sec to > or equal to 2.5 ft/sec to demo improving community ambulation.   Baseline IMproved to 2.6 ft/sec on 10/03/2016   Time 4  Period Weeks   Status Achieved     PT LONG TERM GOAL #6   Title The patient will improve ABC score from 45.6% to > or equal to 55% to demo  improving confidence with mobility.   Baseline Patient improved to 56.3% on 09/14/2016   Time 4   Period Weeks   Status Achieved               Plan - 10-15-16 1020    Clinical Impression Statement The patient has met LTGs.  She has long term plan for management of chronic deficits related to CVA.   PT Treatment/Interventions ADLs/Self Care Home Management;Therapeutic activities;Therapeutic exercise;Balance training;Neuromuscular re-education;Patient/family education;Gait training;Orthotic Fit/Training;Functional mobility training;Electrical Stimulation;Manual techniques   PT Next Visit Plan Discharge today   Consulted and Agree with Plan of Care Patient      Patient will benefit from skilled therapeutic intervention in order to improve the following deficits and impairments:  Abnormal gait, Decreased activity tolerance, Decreased balance, Difficulty walking, Impaired tone, Decreased mobility, Decreased strength, Impaired flexibility  Visit Diagnosis: Other abnormalities of gait and mobility  Unsteadiness on feet  Muscle weakness (generalized)       G-Codes - 2016/10/15 1023    Functional Assessment Tool Used Berg=42/56   Functional Limitation Mobility: Walking and moving around   Mobility: Walking and Moving Around Goal Status [phone]) At least 20 percent but less than 40 percent impaired, limited or restricted   Mobility: Walking and Moving Around Discharge Status [phone]) At least 20 percent but less than 40 percent impaired, limited or restricted      PHYSICAL THERAPY DISCHARGE SUMMARY  Visits from Start of Care: 9  Current functional level related to goals / functional outcomes: See above   Remaining deficits: Chronic hemimotor impairments   Education / Equipment: Comprehensive HEP, use of device/ AFO.  Plan: Patient agrees to discharge.  Patient goals were met. Patient is being discharged due to meeting the stated rehab goals.  ?????        Thank you for  the referral of this patient. Rudell Cobb, MPT   [place] 10-15-16, 10:25 AM  [facility] [address] [place], [place], [address] Phone: [phone]   Fax:  [phone]  Name: [name] MRN: [id number] Date of Birth: 10-20-1955

## 2016-10-04 DIAGNOSIS — H02413 Mechanical ptosis of bilateral eyelids: Secondary | ICD-10-CM | POA: Diagnosis not present

## 2016-10-06 ENCOUNTER — Telehealth (HOSPITAL_COMMUNITY): Payer: Self-pay

## 2016-10-06 NOTE — Telephone Encounter (Signed)
Pt agreed to f/u in 1 yr with mri/mra. AW 

## 2016-11-02 DIAGNOSIS — H02834 Dermatochalasis of left upper eyelid: Secondary | ICD-10-CM | POA: Diagnosis not present

## 2016-11-02 DIAGNOSIS — H02413 Mechanical ptosis of bilateral eyelids: Secondary | ICD-10-CM | POA: Diagnosis not present

## 2016-11-02 DIAGNOSIS — H02831 Dermatochalasis of right upper eyelid: Secondary | ICD-10-CM | POA: Diagnosis not present

## 2016-12-24 ENCOUNTER — Other Ambulatory Visit: Payer: Self-pay | Admitting: Nurse Practitioner

## 2016-12-26 ENCOUNTER — Other Ambulatory Visit: Payer: Self-pay | Admitting: Nurse Practitioner

## 2017-01-04 DIAGNOSIS — S93491A Sprain of other ligament of right ankle, initial encounter: Secondary | ICD-10-CM | POA: Diagnosis not present

## 2017-01-09 DIAGNOSIS — I10 Essential (primary) hypertension: Secondary | ICD-10-CM | POA: Diagnosis not present

## 2017-01-09 DIAGNOSIS — E039 Hypothyroidism, unspecified: Secondary | ICD-10-CM | POA: Diagnosis not present

## 2017-01-09 DIAGNOSIS — E785 Hyperlipidemia, unspecified: Secondary | ICD-10-CM | POA: Diagnosis not present

## 2017-01-09 DIAGNOSIS — R7309 Other abnormal glucose: Secondary | ICD-10-CM | POA: Diagnosis not present

## 2017-01-12 ENCOUNTER — Encounter (INDEPENDENT_AMBULATORY_CARE_PROVIDER_SITE_OTHER): Payer: Self-pay

## 2017-01-12 ENCOUNTER — Encounter: Payer: Self-pay | Admitting: Nurse Practitioner

## 2017-01-12 ENCOUNTER — Ambulatory Visit (INDEPENDENT_AMBULATORY_CARE_PROVIDER_SITE_OTHER): Payer: Medicare Other | Admitting: Nurse Practitioner

## 2017-01-12 VITALS — BP 123/71 | HR 85 | Ht 62.0 in | Wt 160.0 lb

## 2017-01-12 DIAGNOSIS — G43009 Migraine without aura, not intractable, without status migrainosus: Secondary | ICD-10-CM

## 2017-01-12 DIAGNOSIS — I619 Nontraumatic intracerebral hemorrhage, unspecified: Secondary | ICD-10-CM | POA: Diagnosis not present

## 2017-01-12 DIAGNOSIS — M21371 Foot drop, right foot: Secondary | ICD-10-CM

## 2017-01-12 DIAGNOSIS — G40909 Epilepsy, unspecified, not intractable, without status epilepticus: Secondary | ICD-10-CM | POA: Diagnosis not present

## 2017-01-12 MED ORDER — TOPIRAMATE 25 MG PO TABS: ORAL_TABLET | ORAL | 1 refills | Status: DC

## 2017-01-12 MED ORDER — LEVETIRACETAM 500 MG PO TABS: 500.0000 mg | ORAL_TABLET | Freq: Two times a day (BID) | ORAL | 1 refills | Status: DC

## 2017-01-12 NOTE — Progress Notes (Signed)
[facility]  PATIENT: [name] DOB: 26-Oct-1955   REASON FOR VISIT: Follow-up for nontraumatic intracerebral hemorrhage, abnormality of gait, right foot drop, complex partial seizure disorder, and vascular headaches HISTORY FROM: Patient and husband    HISTORY OF PRESENT ILLNESS:2/28/17[name], 61 year old female returns for followup. She was last seen in our office 05/05/15. 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 She states her headaches are doing well on Topamax 75 mg daily however she recently went to the eye doctor and was told she probably needed to discontinue the Topamax. I see nothing in the record. Her tremors were doing well,not bothersome She remains on Keppra She has not had any further seizures. She does complain of occasional fluttering of both her eyes but this is transient and is not similar to her prior seizures. She continues to have right-sided weakness and gait difficulty and incoordination is unchanged. She walks with a single-point cane. She is no longer using her TENS unit as the battery needs replacing. She is also on aspirin and Plavix and CT angio is followed by Dr. Britt Bottom now yearly . Her mild cognitive stable. She has had one fall in the last year. She returns for routine followup.   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  She returns for followup after last visit on 09/20/12. She continues to have headaches several times a week , she does not describe them as  disabling.. She remains on Topamax 25mg  tab, total dose of 75mg  daily, is tolerating well without side effects but complains of mild memory loss. She underwent cerebral catheter angiogram on 10//13 by Dr.Deveshwar which showed stable appearance of pericallosal and left P2/P3 segment aneurysms. She states some mild tremor in the hand continues and it is not disabling.She complains of persistent mild right-sided weakness as well as some temperature sensitivity and sensation of coldness of the right foot ever since her stroke in 2011.She does feel her AFO tens unit is effective. She has fallen recently.   Update 11/6/2017PS :  She returns for follow-up after last visit 8 months ago. She is accompanied by husband. Patient states she is doing well except that she had a fall a month ago. She bruised her right hand which is yet swollen and painful. She was seen in urgent care and x-rays did not show any fracture but her symptoms have persisted. She has not yet seen her primary physician for this. She states her migraines are doing well and she has only occasional 1 or 2 headaches a month which are not bad. She remains on Topamax 25 g twice daily which is tolerating well. She continues to have poor short-term memory difficulties which are about unchanged. She states her blood pressure is well controlled. She is tolerating Lipitor well without muscle aches or pain. She remains on Plavix which she does bruise easily but has not had any major bleeding problems. She is walking with a cane. She has no new complaints. UPDATE 05/10/2018[name], 61 year old female returns for follow-up She has hx of left frontopareital parenchymal hemorrhage in March 2011 from PRES and malignant hypertension.she has a history  of chronic headaches. She states her headaches are doing well on Topamax 75 mg daily.. Her tremors were doing well,not bothersome She remains on Keppra 500 mg twice daily .She has not had any further  seizures. She does complain of occasional fluttering of both her eyes but this is transient and is not similar to her prior seizures. She continues to have right-sided weakness and gait difficulty and incoordination is unchanged. She walks with a single-point cane. She is no longer using her TENS unit. She is also on aspirin and Plavix and CT angio is followed by Dr. Britt Bottom now yearly . Blood pressure in the office today 123/71 Her mild cognitive impairment stable. She has had 2 falls since last seen her  right ankle is in a boot . She returns for routine followup.     REVIEW OF SYSTEMS: Full 14 system review of systems performed and notable only for those listed, all others are neg:  Constitutional: Fatigue  Cardiovascular: Leg swelling Ear/Nose/Throat: neg  Skin: neg Eyes: Recent eye surgery Respiratory: neg Gastroitestinal: neg  Hematology/Lymphatic: Easy bruising Endocrine: neg Musculoskeletal: Walking difficulty Allergy/Immunology: neg Neurological: Weakness Psychiatric: Depression and anxiety Sleep : neg   ALLERGIES: Allergies  Allergen Reactions  . Phenytoin Anaphylaxis, Hives and Swelling    HOME MEDICATIONS: Outpatient Medications Prior to Visit  Medication Sig Dispense Refill  . ALPRAZolam (XANAX) 0.25 MG tablet Take 0.25 mg by mouth 2 (two) times daily as needed for anxiety or sleep.     . B Complex-C (SUPER B COMPLEX PO) Take 1 tablet by mouth daily.    . Biotin w/ Vitamins C & E (HAIR SKIN & NAILS GUMMIES PO) Take 1 capsule by mouth 2 (two) times daily.    . Cholecalciferol (VITAMIN D) 2000 UNITS CAPS Take 2,000 Units by mouth 2 (two) times daily.    . citalopram (CELEXA) 40 MG tablet Take 40 mg by mouth at bedtime.    . clindamycin (CLEOCIN T) 1 % external solution Apply 1 application topically 2 (two) times daily. Apply to face    . clopidogrel (PLAVIX) 75 MG tablet Take 75 mg by mouth daily with breakfast.    . Cranberry-Cholecalciferol (SUPER  CRANBERRY/VITAMIN D3 PO) Take 1 tablet by mouth 2 (two) times daily.     . cyclobenzaprine (FLEXERIL) 10 MG tablet Take 10 mg by mouth at bedtime.     . diclofenac sodium (VOLTAREN) 1 % GEL Apply 2 g topically 3 (three) times daily as needed. For pain    . famotidine (PEPCID) 20 MG tablet Take 20 mg by mouth 2 (two) times daily.     Marland Kitchen levETIRAcetam (KEPPRA) 500 MG tablet TAKE 1 TABLET EVERY 12 HOURS 180 tablet 1  . levothyroxine (SYNTHROID, LEVOTHROID) 50 MCG tablet Take 50 mcg by mouth daily before breakfast.     . losartan (COZAAR) 100 MG tablet Take 100 mg by mouth every morning.     . Omega-3 Fatty Acids (FISH OIL) 1000 MG CAPS Take 1,000 mg by mouth daily.    . rosuvastatin (CRESTOR) 10 MG tablet Take 10 mg by mouth at bedtime.     . topiramate (TOPAMAX) 25 MG tablet TAKE 1 TABLET IN THE MORNING AND 2 TABLETS IN THE EVENING 270 tablet 1  . tretinoin (RETIN-A) 0.05 % cream APP ON THE SKIN NIGHTLY  3  . Calcium Carb-Cholecalciferol (CALCIUM 500 +D) 500-400 MG-UNIT TABS Take 1 tablet by mouth daily.    . Cyanocobalamin (VITAMIN B-12 PO) Take 1,000 mcg by  mouth every morning.    . Multiple Vitamins-Minerals (HAIR/SKIN/NAILS) TABS Take 1 tablet by mouth 2 (two) times daily.     No facility-administered medications prior to visit.     PAST MEDICAL HISTORY: Past Medical History:  Diagnosis Date  . Abnormal gait   . Amnesia   . Anemia    hx  . Aneurysm ([place])   . Anxiety   . Asthma   . Atrophic vaginitis   . Benign hypertensive heart disease   . Cardiomegaly   . Chest pain   . COPD (chronic obstructive pulmonary disease) ([place])     - PFTs  01/24/06 FEV1 69% ratio 68% diffusing capacity 57% with no improvement after B2   - PFT's 10/1/ 09 FEV1 77   ratio  76    dlco                   45            no resp to B2    - Nl alpha one antitripsin level 10/09  . Cough   . Depression   . Difficulty speaking   . Disorder of nasal cavity   . Dyspnea   . Enthesopathy of elbow region   . GERD  (gastroesophageal reflux disease)   . Hammer toe   . Headache(784.0)   . Hemiparesis affecting dominant side as late effect of cerebrovascular accident ([place])   . History of kidney stones   . Hyperlipidemia   . Hypertension   . Hypothyroidism   . Increased urinary frequency   . Low compliance bladder   . Malaise and fatigue   . Nonruptured cerebral aneurysm   . Obesity   . Pneumonia    hx  . Pure hypercholesterolemia   . Recurrent major depressive episodes ([place])   . Seizure disorder, secondary ([place])    2-3 focal siezures monthly  . Seizures ([place])   . Short-term memory loss    since stroke  . Skin sensation disturbance   . Stroke [facility]) 2011   rt sided weakness  . Swelling of limb   . TMJ (temporomandibular joint disorder)   . Urinary tract infectious disease   . Vertigo as late effect of stroke   . Vitamin D deficiency     PAST SURGICAL HISTORY: Past Surgical History:  Procedure Laterality Date  . BACK SURGERY    . CEREBRAL ANGIOGRAM     x3  . COLONOSCOPY WITH PROPOFOL N/A 08/18/2016   Procedure: COLONOSCOPY WITH PROPOFOL;  Surgeon: Carol Ada, MD;  Location: [facility];  Service: Endoscopy;  Laterality: N/A;  . LAPAROSCOPIC HYSTERECTOMY    . MANDIBLE FRACTURE SURGERY    . RADIOLOGY WITH ANESTHESIA N/A 08/21/2013   Procedure: RADIOLOGY WITH ANESTHESIA;  Surgeon: Rob Hickman, MD;  Location: [facility];  Service: Radiology;  Laterality: N/A;  . stones     no surgery    FAMILY HISTORY: Family History  Problem Relation Age of Onset  . Heart disease Mother   . Heart disease Father   . Heart attack Father   . Lung cancer Unknown     SOCIAL HISTORY: Social History   Social History  . Marital status: Married    Spouse name: Jori Moll  . Number of children: 2  . Years of education: 14   Occupational History  . 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 Smoking status: Former Smoker    Quit date: 09/05/2008  . Smokeless  tobacco: Never Used  . Alcohol use No     Comment: patient quit alcohol use 2005.   . Drug use: No  . Sexual activity: Not on file   Other Topics Concern  . Not on file   Social History Narrative   Patient lives at home with her husband Jori Moll).   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    Patient is on disability.    Patient has an Associates degree.   Patient is right-handed.   Patient drinks 2 cups of green tea most days.                 PHYSICAL EXAM  Vitals:   01/12/17 1003  BP: 123/71  Pulse: 85  Weight: 160 lb (72.6 kg)  Height: 5\' 2"  (1.575 m)   Body mass index is 29.26 kg/m.  Generalized: Well developed, in no acute distress  Head: normocephalic and atraumatic,. Oropharynx benign  Neck: Supple, no carotid bruits  Cardiac: Regular rate rhythm, no murmur     Neurological examination   Mentation: Alert oriented to time, place, history taking. Follows all commands speech and language fluent mini-mental status exam not done today but diminished recall 1/3.    Cranial nerve II-XII: Pupils were equal round reactive to light extraocular movements were full, visual field were full on confrontational test. Facial sensation and strength were normal. hearing was intact to finger rubbing bilaterally. Uvula tongue midline. head turning and shoulder shrug were normal and symmetric.Tongue protrusion into cheek strength was normal. Motor: Mild 4/5 right-sided hemiparesis with distal more than proximal weakness increased tone on the right upper and lower extremity. Right foot drop. Boot: Right lower leg due to ankle injury  Sensory: Touch and pinprick are normal on the left decreased on the right, vibratory is decreased on the right lower extremity  Coordination: Impaired on the right normal on the left  Reflexes: Brachioradialis 2/2, biceps 2/2, triceps 2/2, patellar 2/2, Achilles 1/1, plantar responses were flexor bilaterally. Gait  and Station: Rising up from seated position without assistance, wide based gait right foot drop using  single-point cane unsteady gait   DIAGNOSTIC DATA (LABS, IMAGING, TESTING) - I reviewed patient records, labs, notes, testing and imaging myself where available.  Lab Results  Component Value Date   WBC 5.3 05/28/2015   HGB 11.8 (L) 05/28/2015   HCT 37.2 05/28/2015   MCV 94.7 05/28/2015   PLT 182 05/28/2015      Component Value Date/Time   NA 143 05/28/2015 1302   K 4.7 05/28/2015 1302   CL 110 05/28/2015 1302   CO2 26 05/28/2015 1302   GLUCOSE 94 05/28/2015 1302   BUN 12 05/28/2015 1302   CREATININE 0.76 08/24/2016 0800   CALCIUM 9.2 05/28/2015 1302   PROT 6.1 (L) 05/28/2015 1302   ALBUMIN 3.4 (L) 05/28/2015 1302   AST 23 05/28/2015 1302   ALT 17 05/28/2015 1302   ALKPHOS 76 05/28/2015 1302   BILITOT 0.5 05/28/2015 1302   GFRNONAA >60 08/24/2016 0800   GFRAA >60 08/24/2016 0800         ASSESSMENT AND PLAN 61 y.o. year old female has a past medical history of left frontoparietal parenchymal hemorrhage March 2011 from PRES and malignant hypertension. Chronic headaches with history of migraines improved.Placement of pipeline stent across Left PICA aneurysm by Dr. Estanislado Pandy on 08/21/2013. History of seizure disorder . Mild cognitive impairment.here to follow up.   Continue  Plavix for stroke prevention  Control of hypertension with blood pressure goal below 130/90 Today's reading 123/71   lipids with LDL cholesterol goal below 70 mg percent. Continue Crestor, Labs Followed by primary care Continue Keppra 500 mg twice daily, no seizure activity Continue Topamax 75mg  daily for migraine prevention  I also counseled the patient to use a cane or walker at all times  walking and fall and safety precautions.  F/U in 8 months I spent 25 min  in total face to face time with the patient more than 50% of which was spent counseling and coordination of care, reviewing test results  reviewing medications and discussing and reviewing the diagnosis of stroke and management of risk factors, migraine prevention, and seizure disorderand further treatment options. , Rayburn Ma, [facility], APRN  [facility] [address] [place], [place] [address] [phone]

## 2017-01-12 NOTE — Patient Instructions (Signed)
Continue Plavix for stroke prevention  Control of hypertension with blood pressure goal below 130/90 Today's reading 123/71  lipids with LDL cholesterol goal below 70 mg percent. Continue Crestor Continue Keppra 500 mg twice daily Continue Topamax 25 mg for migraine prevention  I also counseled the patient to use a cane or walker at all times  walking and fall and safety precautions.  F/U in 8 months

## 2017-01-16 NOTE — Progress Notes (Signed)
I agree with the above plan 

## 2017-04-04 DIAGNOSIS — H353131 Nonexudative age-related macular degeneration, bilateral, early dry stage: Secondary | ICD-10-CM | POA: Diagnosis not present

## 2017-04-04 DIAGNOSIS — Z79899 Other long term (current) drug therapy: Secondary | ICD-10-CM | POA: Diagnosis not present

## 2017-04-04 DIAGNOSIS — H2513 Age-related nuclear cataract, bilateral: Secondary | ICD-10-CM | POA: Diagnosis not present

## 2017-08-31 DIAGNOSIS — Z01419 Encounter for gynecological examination (general) (routine) without abnormal findings: Secondary | ICD-10-CM | POA: Diagnosis not present

## 2017-08-31 DIAGNOSIS — I10 Essential (primary) hypertension: Secondary | ICD-10-CM | POA: Diagnosis not present

## 2017-08-31 DIAGNOSIS — R7309 Other abnormal glucose: Secondary | ICD-10-CM | POA: Diagnosis not present

## 2017-08-31 DIAGNOSIS — Z0000 Encounter for general adult medical examination without abnormal findings: Secondary | ICD-10-CM | POA: Diagnosis not present

## 2017-08-31 DIAGNOSIS — I129 Hypertensive chronic kidney disease with stage 1 through stage 4 chronic kidney disease, or unspecified chronic kidney disease: Secondary | ICD-10-CM | POA: Diagnosis not present

## 2017-08-31 DIAGNOSIS — M549 Dorsalgia, unspecified: Secondary | ICD-10-CM | POA: Diagnosis not present

## 2017-08-31 DIAGNOSIS — E039 Hypothyroidism, unspecified: Secondary | ICD-10-CM | POA: Diagnosis not present

## 2017-08-31 DIAGNOSIS — E559 Vitamin D deficiency, unspecified: Secondary | ICD-10-CM | POA: Diagnosis not present

## 2017-08-31 DIAGNOSIS — Z1212 Encounter for screening for malignant neoplasm of rectum: Secondary | ICD-10-CM | POA: Diagnosis not present

## 2017-09-14 ENCOUNTER — Ambulatory Visit: Payer: Medicare Other | Admitting: Nurse Practitioner

## 2017-09-18 ENCOUNTER — Encounter: Payer: Self-pay | Admitting: Neurology

## 2017-09-18 ENCOUNTER — Ambulatory Visit (INDEPENDENT_AMBULATORY_CARE_PROVIDER_SITE_OTHER): Payer: Medicare Other | Admitting: Neurology

## 2017-09-18 VITALS — BP 119/79 | HR 85 | Wt 161.6 lb

## 2017-09-18 DIAGNOSIS — G8110 Spastic hemiplegia affecting unspecified side: Secondary | ICD-10-CM | POA: Diagnosis not present

## 2017-09-18 MED ORDER — LEVETIRACETAM 500 MG PO TABS: 500.0000 mg | ORAL_TABLET | Freq: Two times a day (BID) | ORAL | 4 refills | Status: DC

## 2017-09-18 NOTE — Progress Notes (Signed)
[facility]  PATIENT: [name] DOB: 26-Oct-1955   REASON FOR VISIT: Follow-up for nontraumatic intracerebral hemorrhage, abnormality of gait, right foot drop, complex partial seizure disorder, and vascular headaches HISTORY FROM: Patient and husband    HISTORY OF PRESENT ILLNESS:2/28/17[name], 62 year old female returns for followup. She was last seen in our office 05/05/15. 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 She states her headaches are doing well on Topamax 75 mg daily however she recently went to the eye doctor and was told she probably needed to discontinue the Topamax. I see nothing in the record. Her tremors were doing well,not bothersome She remains on Keppra She has not had any further seizures. She does complain of occasional fluttering of both her eyes but this is transient and is not similar to her prior seizures. She continues to have right-sided weakness and gait difficulty and incoordination is unchanged. She walks with a single-point cane. She is no longer using her TENS unit as the battery needs replacing. She is also on aspirin and Plavix and CT angio is followed by Dr. Britt Bottom now yearly . Her mild cognitive stable. She has had one fall in the last year. She returns for routine followup.   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  She returns for followup after last visit on 09/20/12. She continues to have headaches several times a week , she does not describe them as  disabling.. She remains on Topamax 25mg  tab, total dose of 75mg  daily, is tolerating well without side effects but complains of mild memory loss. She underwent cerebral catheter angiogram on 10//13 by Dr.Deveshwar which showed stable appearance of pericallosal and left P2/P3 segment aneurysms. She states some mild tremor in the hand continues and it is not disabling.She complains of persistent mild right-sided weakness as well as some temperature sensitivity and sensation of coldness of the right foot ever since her stroke in 2011.She does feel her AFO tens unit is effective. She has fallen recently.   Update 11/6/2017PS :  She returns for follow-up after last visit 8 months ago. She is accompanied by husband. Patient states she is doing well except that she had a fall a month ago. She bruised her right hand which is yet swollen and painful. She was seen in urgent care and x-rays did not show any fracture but her symptoms have persisted. She has not yet seen her primary physician for this. She states her migraines are doing well and she has only occasional 1 or 2 headaches a month which are not bad. She remains on Topamax 25 g twice daily which is tolerating well. She continues to have poor short-term memory difficulties which are about unchanged. She states her blood pressure is well controlled. She is tolerating Lipitor well without muscle aches or pain. She remains on Plavix which she does bruise easily but has not had any major bleeding problems. She is walking with a cane. She has no new complaints. UPDATE 05/10/2018CM Wendy. [name], 62 year old female returns for follow-up She has hx of left frontopareital parenchymal hemorrhage in March 2011 from PRES and malignant hypertension.she has a history  of chronic headaches. She states her headaches are doing well on Topamax 75 mg daily.. Her tremors were doing well,not bothersome She remains on Keppra 500 mg twice daily .She has not had any further  seizures. She does complain of occasional fluttering of both her eyes but this is transient and is not similar to her prior seizures. She continues to have right-sided weakness and gait difficulty and incoordination is unchanged. She walks with a single-point cane. She is no longer using her TENS unit. She is also on aspirin and Plavix and CT angio is followed by Dr. Britt Bottom now yearly . Blood pressure in the office today 123/71 Her mild cognitive impairment stable. She has had 2 falls since last seen her  right ankle is in a boot . She returns for routine followup.   Update 09/18/2017 : She returns for follow-up after last visit 7 months ago. She is accompanied by husband. She continues to do well and has not had any recurrent strokes or seizures since March 2011. She states her blood pressure is well controlled and today it is 119/79. She remains on Plavix which is tolerating well without bruising or bleeding. She states her lipids have been controlled. She has not had any seizures since 2011 but she is reluctant to consider reducing Keppra which she seems to be tolerating well. She takes a migraine headaches are well controlled. She gets rare 1 significant headache a month does have some mild dull aching pains 2-3 times a week. She takes Topamax 25 mg twice daily. She is not part sparing currently in any activities for stress laxation. He continues to have mild right spastic hemiparesis with foot drop. She does use a cane for long distances. She is careful with her walking and has not had any falls or injuries.  REVIEW OF SYSTEMS: Full 14 system review of systems performed and notable only for those listed, all others are neg:   Appetite change, runny nose, blurred vision, loss of vision, light sensitivity, excessive thirst and eating, constipation, frequent waking, daytime sleepiness, and incontinence of bladder and all other systems negative  ALLERGIES: Allergies  Allergen Reactions  . Phenytoin  Anaphylaxis, Hives and Swelling    HOME MEDICATIONS: Outpatient Medications Prior to Visit  Medication Sig Dispense Refill  . ALPRAZolam (XANAX) 0.25 MG tablet Take 0.25 mg by mouth 2 (two) times daily as needed for anxiety or sleep.     . B Complex-C (SUPER B COMPLEX PO) Take 1 tablet by mouth daily.    . Biotin w/ Vitamins C & E (HAIR SKIN & NAILS GUMMIES PO) Take 1 capsule by mouth 2 (two) times daily.    . Calcium Carb-Cholecalciferol (CALCIUM 500 +D) 500-400 MG-UNIT TABS Take 1 tablet by mouth daily.    . citalopram (CELEXA) 40 MG tablet Take 40 mg by mouth at bedtime.    . clindamycin (CLEOCIN T) 1 % external solution Apply 1 application topically 2 (two) times daily. Apply to face    . clopidogrel (PLAVIX) 75 MG tablet Take 75 mg by mouth daily with breakfast.    . Cranberry-Cholecalciferol (SUPER CRANBERRY/VITAMIN D3 PO) Take 1 tablet by mouth 2 (two) times daily.     . Cyanocobalamin (VITAMIN B-12 PO) Take 1,000 mcg by mouth every morning.    . cyclobenzaprine (FLEXERIL) 10 MG tablet Take 10 mg by mouth at bedtime.     . diclofenac sodium (VOLTAREN) 1 % GEL Apply 2 g topically 3 (three) times daily as needed. For pain    .  famotidine (PEPCID) 20 MG tablet Take 20 mg by mouth 2 (two) times daily.     Marland Kitchen levothyroxine (SYNTHROID, LEVOTHROID) 50 MCG tablet Take 50 mcg by mouth daily before breakfast.     . levothyroxine (SYNTHROID, LEVOTHROID) 75 MCG tablet TK 1 T PO ON "SUNDAYS ONLY  1  . losartan (COZAAR) 100 MG tablet Take 100 mg by mouth every morning.     . Multiple Vitamins-Minerals (HAIR/SKIN/NAILS) TABS Take 1 tablet by mouth 2 (two) times daily.    . Omega-3 Fatty Acids (FISH OIL) 1000 MG CAPS Take 1,000 mg by mouth daily.    . rosuvastatin (CRESTOR) 10 MG tablet Take 10 mg by mouth at bedtime.     . topiramate (TOPAMAX) 25 MG tablet TAKE 1 TABLET IN THE MORNING AND 2 TABLETS IN THE EVENING 270 tablet 1  . tretinoin (RETIN-A) 0.05 % cream APP ON THE SKIN NIGHTLY  3  .  levETIRAcetam (KEPPRA) 500 MG tablet Take 1 tablet (500 mg total) by mouth every 12 (twelve) hours. 180 tablet 1  . Cholecalciferol (VITAMIN D) 2000 UNITS CAPS Take 2,000 Units by mouth 2 (two) times daily.     No facility-administered medications prior to visit.     PAST MEDICAL HISTORY: Past Medical History:  Diagnosis Date  . Abnormal gait   . Amnesia   . Anemia    hx  . Aneurysm (HCC)   . Anxiety   . Asthma   . Atrophic vaginitis   . Benign hypertensive heart disease   . Cardiomegaly   . Chest pain   . COPD (chronic obstructive pulmonary disease) (HCC)     - PFTs  01/24/06 FEV1 69% ratio 68% diffusing capacity 57% with no improvement after B2   - PFT's 10/1/ 09 FEV1 77   ratio  76    dlco                   45"             no resp to B2    - Nl alpha one antitripsin level 10/09  . Cough   . Depression   . Difficulty speaking   . Disorder of nasal cavity   . Dyspnea   . Enthesopathy of elbow region   . GERD (gastroesophageal reflux disease)   . Hammer toe   . Headache(784.0)   . Hemiparesis affecting dominant side as late effect of cerebrovascular accident ([place])   . History of kidney stones   . Hyperlipidemia   . Hypertension   . Hypothyroidism   . Increased urinary frequency   . Low compliance bladder   . Malaise and fatigue   . Nonruptured cerebral aneurysm   . Obesity   . Pneumonia    hx  . Pure hypercholesterolemia   . Recurrent major depressive episodes ([place])   . Seizure disorder, secondary ([place])    2-3 focal siezures monthly  . Seizures ([place])   . Short-term memory loss    since stroke  . Skin sensation disturbance   . Stroke [facility]) 2011   rt sided weakness  . Swelling of limb   . TMJ (temporomandibular joint disorder)   . Urinary tract infectious disease   . Vertigo as late effect of stroke   . Vitamin D deficiency     PAST SURGICAL HISTORY: Past Surgical History:  Procedure Laterality Date  . BACK SURGERY    . CEREBRAL ANGIOGRAM     x3  .  COLONOSCOPY WITH  PROPOFOL N/A 08/18/2016   Procedure: COLONOSCOPY WITH PROPOFOL;  Surgeon: Carol Ada, MD;  Location: [facility];  Service: Endoscopy;  Laterality: N/A;  . LAPAROSCOPIC HYSTERECTOMY    . MANDIBLE FRACTURE SURGERY    . RADIOLOGY WITH ANESTHESIA N/A 08/21/2013   Procedure: RADIOLOGY WITH ANESTHESIA;  Surgeon: Rob Hickman, MD;  Location: [place];  Service: Radiology;  Laterality: N/A;  . stones     no surgery    FAMILY HISTORY: Family History  Problem Relation Age of Onset  . Heart disease Mother   . Heart disease Father   . Heart attack Father   . Lung cancer Unknown     SOCIAL HISTORY: Social History   Socioeconomic History  . Marital status: Married    Spouse name: Jori Moll  . Number of children: 2  . Years of education: 51  . Highest education level: Not on file  Social Needs  . Financial resource strain: Not on file  . Food insecurity - worry: Not on file  . Food insecurity - inability: Not on file  . Transportation needs - medical: Not on file  . Transportation needs - non-medical: Not on file  Occupational History  . 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 Smoking status: Former Smoker    Last attempt to quit: 09/05/2008    Years since quitting: 9.0  . Smokeless tobacco: Never Used  Substance and Sexual Activity  . Alcohol use: No    Alcohol/week: 0.0 oz    Comment: patient quit alcohol use 2005.   . Drug use: No  . Sexual activity: Not on file  Other Topics Concern  . Not on file  Social History Narrative   Patient lives at home with her husband Jori Moll).   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    Patient is on disability.    Patient has an Associates degree.   Patient is right-handed.   Patient drinks 2 cups of green tea most days.                 PHYSICAL EXAM  Vitals:   09/18/17 1006  BP: 119/79  Pulse: 85  Weight: 161 lb 9.6 oz (73.3  kg)   Body mass index is 29.56 kg/m.  Generalized: Well developed, in no acute distress  Head: normocephalic and atraumatic,. Oropharynx benign  Neck: Supple, no carotid bruits  Cardiac: Regular rate rhythm, no murmur     Neurological examination   Mentation: Alert oriented to time, place, history taking. Follows all commands speech and language fluent mini-mental status exam not done today but diminished recall 1/3.    Cranial nerve II-XII: Pupils were equal round reactive to light extraocular movements were full, visual field were full on confrontational test. Facial sensation and strength were normal. hearing was intact to finger rubbing bilaterally. Uvula tongue midline. head turning and shoulder shrug were normal and symmetric.Tongue protrusion into cheek strength was normal. Motor: Mild 4/5 right-sided hemiparesis with distal more than proximal weakness increased tone on the right upper and lower extremity. Right foot drop. Boot: Right lower leg due to ankle injury  Sensory: Touch and pinprick are normal on the left decreased on the right, vibratory is decreased on the right lower extremity  Coordination: Impaired on the right normal on the left  Reflexes: Brachioradialis 2/2, biceps 2/2, triceps 2/2, patellar 2/2, Achilles 1/1, plantar responses were flexor bilaterally. Gait  and Station: Rising up from seated position without assistance, wide based gait right foot drop using  single-point cane unsteady gait   DIAGNOSTIC DATA (LABS, IMAGING, TESTING) - I reviewed patient records, labs, notes, testing and imaging myself where available.  Lab Results  Component Value Date   WBC 5.3 05/28/2015   HGB 11.8 (L) 05/28/2015   HCT 37.2 05/28/2015   MCV 94.7 05/28/2015   PLT 182 05/28/2015      Component Value Date/Time   NA 143 05/28/2015 1302   K 4.7 05/28/2015 1302   CL 110 05/28/2015 1302   CO2 26 05/28/2015 1302   GLUCOSE 94 05/28/2015 1302   BUN 12 05/28/2015 1302    CREATININE 0.76 08/24/2016 0800   CALCIUM 9.2 05/28/2015 1302   PROT 6.1 (L) 05/28/2015 1302   ALBUMIN 3.4 (L) 05/28/2015 1302   AST 23 05/28/2015 1302   ALT 17 05/28/2015 1302   ALKPHOS 76 05/28/2015 1302   BILITOT 0.5 05/28/2015 1302   GFRNONAA >60 08/24/2016 0800   GFRAA >60 08/24/2016 0800         ASSESSMENT AND PLAN 62 y.o. year old female has a past medical history of left frontoparietal parenchymal hemorrhage March 2011 from PRES and malignant hypertension. Chronic headaches with history of migraines  And tension headaches improved.Placement of pipeline stent across Left PICA aneurysm by Dr. Estanislado Pandy on 08/21/2013. History of seizure disorder . Mild cognitive impairment.here to follow up.   I had a long discussion with the patient and husband regarding her remote intracerebral hemorrhage, spastic hemiplegia, remote seizures, migraine and tension headaches and answered questions. She seems to be doing well and has remained quite stable. Continue Plavix for stroke prevention Maintain strict control of hypertension with blood pressure goal below 130/90 ,lipids with LDL cholesterol goal below 70 mg percent. Continue Crestor, Labs Followed by primary care Continue Keppra 500 mg twice daily, no seizure activity since 2011. Patient was advised to consider reducing the medication but she is not willing to try this at the present time. I gave the patient one year refill of Keppra Continue Topamax 25mg  twice daily for migraine prevention  I also counseled the patient to use a cane or walker at all times  walking and fall and safety precautions.  F/U in 12 months I spent 25 min  in total face to face time with the patient more than 50% of which was spent counseling and coordination of care, reviewing test results reviewing medications and discussing and reviewing the diagnosis of stroke and management of risk factors, migraine prevention, and seizure disorderand further treatment options.  ,   Antony Contras, MD [facility] [address] [place], [place] [address] [phone]

## 2017-09-18 NOTE — Patient Instructions (Signed)
I had a long discussion with the patient and husband regarding her remote intracerebral hemorrhage, symptomatic seizures, spastic right hemiplegia and tension headaches and answered questions. Continue Keppra and the current dose of 500 mg twice daily. We discussed possibility of reducing medications but patient is not willing to consider this at the present time. I will refill the Keppra for a year. Continue Topamax and the current dose for tension headaches and encouraged patient to increase participation in regular activities for stress relaxation-like learning meditation, yoga and regular exercises. She will use a cane at all times for ambulating long distances to avoid falling. She will return for follow-up in a year with my nurse practitioner Cecille Rubin or call earlier if necessary.   Tension Headache A tension headache is a feeling of pain, pressure, or aching that is often felt over the front and sides of the head. The pain can be dull, or it can feel tight (constricting). Tension headaches are not normally associated with nausea or vomiting, and they do not get worse with physical activity. Tension headaches can last from 30 minutes to several days. This is the most common type of headache. CAUSES The exact cause of this condition is not known. Tension headaches often begin after stress, anxiety, or depression. Other triggers may include:  Alcohol.  Too much caffeine, or caffeine withdrawal.  Respiratory infections, such as colds, flu, or sinus infections.  Dental problems or teeth clenching.  Fatigue.  Holding your head and neck in the same position for a long period of time, such as while using a computer.  Smoking. SYMPTOMS Symptoms of this condition include:  A feeling of pressure around the head.  Dull, aching head pain.  Pain felt over the front and sides of the head.  Tenderness in the muscles of the head, neck, and shoulders. DIAGNOSIS This condition may be  diagnosed based on your symptoms and a physical exam. Tests may be done, such as a CT scan or an MRI of your head. These tests may be done if your symptoms are severe or unusual. TREATMENT This condition may be treated with lifestyle changes and medicines to help relieve symptoms. HOME CARE INSTRUCTIONS Managing Pain  Take over-the-counter and prescription medicines only as told by your health care provider.  Lie down in a dark, quiet room when you have a headache.  If directed, apply ice to the head and neck area: ? Put ice in a plastic bag. ? Place a towel between your skin and the bag. ? Leave the ice on for 20 minutes, 2-3 times per day.  Use a heating pad or a hot shower to apply heat to the head and neck area as told by your health care provider. Eating and Drinking  Eat meals on a regular schedule.  Limit alcohol use.  Decrease your caffeine intake, or stop using caffeine. General Instructions  Keep all follow-up visits as told by your health care provider. This is important.  Keep a headache journal to help find out what may trigger your headaches. For example, write down: ? What you eat and drink. ? How much sleep you get. ? Any change to your diet or medicines.  Try massage or other relaxation techniques.  Limit stress.  Sit up straight, and avoid tensing your muscles.  Do not use tobacco products, including cigarettes, chewing tobacco, or e-cigarettes. If you need help quitting, ask your health care provider.  Exercise regularly as told by your health care provider.  Get 7-9  hours of sleep, or the amount recommended by your health care provider. SEEK MEDICAL CARE IF:  Your symptoms are not helped by medicine.  You have a headache that is different from what you normally experience.  You have nausea or you vomit.  You have a fever. SEEK IMMEDIATE MEDICAL CARE IF:  Your headache becomes severe.  You have repeated vomiting.  You have a stiff  neck.  You have a loss of vision.  You have problems with speech.  You have pain in your eye or ear.  You have muscular weakness or loss of muscle control.  You lose your balance or you have trouble walking.  You feel faint or you pass out.  You have confusion. This information is not intended to replace advice given to you by your health care provider. Make sure you discuss any questions you have with your health care provider. Document Released: 08/22/2005 Document Revised: 05/13/2015 Document Reviewed: 12/15/2014 Elsevier Interactive Patient Education  2017 [employer].

## 2017-09-21 ENCOUNTER — Other Ambulatory Visit (HOSPITAL_COMMUNITY): Payer: Self-pay | Admitting: Interventional Radiology

## 2017-09-21 DIAGNOSIS — I729 Aneurysm of unspecified site: Secondary | ICD-10-CM

## 2017-09-29 ENCOUNTER — Ambulatory Visit (HOSPITAL_COMMUNITY)
Admission: RE | Admit: 2017-09-29 | Discharge: 2017-09-29 | Disposition: A | Discharge status: Home / Self Care | Payer: Medicare Other | Source: Ambulatory Visit | Attending: Interventional Radiology | Admitting: Interventional Radiology

## 2017-09-29 DIAGNOSIS — I6782 Cerebral ischemia: Secondary | ICD-10-CM | POA: Insufficient documentation

## 2017-09-29 DIAGNOSIS — I671 Cerebral aneurysm, nonruptured: Secondary | ICD-10-CM | POA: Insufficient documentation

## 2017-09-29 DIAGNOSIS — Z95828 Presence of other vascular implants and grafts: Secondary | ICD-10-CM | POA: Diagnosis not present

## 2017-09-29 DIAGNOSIS — I729 Aneurysm of unspecified site: Secondary | ICD-10-CM

## 2017-09-29 DIAGNOSIS — G9389 Other specified disorders of brain: Secondary | ICD-10-CM | POA: Diagnosis not present

## 2017-09-29 LAB — CREATININE, SERUM
Creatinine, Ser: 0.86 mg/dL (ref 0.44–1.00)
GFR calc Af Amer: >60 mL/min (ref >60)
GFR calc non Af Amer: >60 mL/min (ref >60)

## 2017-10-06 DIAGNOSIS — H04123 Dry eye syndrome of bilateral lacrimal glands: Secondary | ICD-10-CM | POA: Diagnosis not present

## 2017-10-06 DIAGNOSIS — Z79899 Other long term (current) drug therapy: Secondary | ICD-10-CM | POA: Diagnosis not present

## 2017-10-06 DIAGNOSIS — H10413 Chronic giant papillary conjunctivitis, bilateral: Secondary | ICD-10-CM | POA: Diagnosis not present

## 2017-10-06 DIAGNOSIS — H2513 Age-related nuclear cataract, bilateral: Secondary | ICD-10-CM | POA: Diagnosis not present

## 2017-10-06 DIAGNOSIS — H353131 Nonexudative age-related macular degeneration, bilateral, early dry stage: Secondary | ICD-10-CM | POA: Diagnosis not present

## 2017-10-24 DIAGNOSIS — R202 Paresthesia of skin: Secondary | ICD-10-CM | POA: Diagnosis not present

## 2017-10-24 DIAGNOSIS — R1310 Dysphagia, unspecified: Secondary | ICD-10-CM | POA: Diagnosis not present

## 2017-10-24 DIAGNOSIS — R0982 Postnasal drip: Secondary | ICD-10-CM | POA: Diagnosis not present

## 2017-10-24 DIAGNOSIS — E039 Hypothyroidism, unspecified: Secondary | ICD-10-CM | POA: Diagnosis not present

## 2017-10-24 DIAGNOSIS — Z79899 Other long term (current) drug therapy: Secondary | ICD-10-CM | POA: Diagnosis not present

## 2017-10-24 DIAGNOSIS — M542 Cervicalgia: Secondary | ICD-10-CM | POA: Diagnosis not present

## 2017-10-25 ENCOUNTER — Telehealth (HOSPITAL_COMMUNITY): Payer: Self-pay | Admitting: Radiology

## 2017-10-25 NOTE — Telephone Encounter (Signed)
Pt called and wanted her recent imaging results. I told her that Dr. Estanislado Pandy has not yet reviewed those results but that she would get a call as soon as he has done so. The patient agrees with this plan. JM

## 2017-10-31 ENCOUNTER — Other Ambulatory Visit (HOSPITAL_COMMUNITY): Payer: Self-pay | Admitting: Nurse Practitioner

## 2017-10-31 DIAGNOSIS — R1310 Dysphagia, unspecified: Secondary | ICD-10-CM

## 2017-11-08 ENCOUNTER — Ambulatory Visit (HOSPITAL_COMMUNITY): Payer: Medicare Other

## 2017-11-13 ENCOUNTER — Emergency Department (HOSPITAL_COMMUNITY)
Admission: EM | Admit: 2017-11-13 | Discharge: 2017-11-13 | Disposition: A | Discharge status: Home / Self Care | Payer: Medicare Other | Attending: Emergency Medicine | Admitting: Emergency Medicine

## 2017-11-13 ENCOUNTER — Encounter (HOSPITAL_COMMUNITY): Payer: Self-pay | Admitting: Emergency Medicine

## 2017-11-13 ENCOUNTER — Ambulatory Visit (HOSPITAL_COMMUNITY)
Admission: EM | Admit: 2017-11-13 | Discharge: 2017-11-13 | Disposition: A | Discharge status: Home / Self Care | Payer: Medicare Other

## 2017-11-13 ENCOUNTER — Emergency Department (HOSPITAL_COMMUNITY): Payer: Medicare Other

## 2017-11-13 ENCOUNTER — Encounter (HOSPITAL_COMMUNITY): Payer: Self-pay

## 2017-11-13 ENCOUNTER — Other Ambulatory Visit: Payer: Self-pay

## 2017-11-13 DIAGNOSIS — G4452 New daily persistent headache (NDPH): Secondary | ICD-10-CM | POA: Diagnosis not present

## 2017-11-13 DIAGNOSIS — R51 Headache: Secondary | ICD-10-CM | POA: Diagnosis present

## 2017-11-13 DIAGNOSIS — I10 Essential (primary) hypertension: Secondary | ICD-10-CM | POA: Diagnosis not present

## 2017-11-13 DIAGNOSIS — Z79899 Other long term (current) drug therapy: Secondary | ICD-10-CM | POA: Diagnosis not present

## 2017-11-13 DIAGNOSIS — S0990XA Unspecified injury of head, initial encounter: Secondary | ICD-10-CM | POA: Diagnosis not present

## 2017-11-13 DIAGNOSIS — S199XXA Unspecified injury of neck, initial encounter: Secondary | ICD-10-CM | POA: Diagnosis not present

## 2017-11-13 DIAGNOSIS — E039 Hypothyroidism, unspecified: Secondary | ICD-10-CM | POA: Insufficient documentation

## 2017-11-13 DIAGNOSIS — Z87891 Personal history of nicotine dependence: Secondary | ICD-10-CM | POA: Insufficient documentation

## 2017-11-13 DIAGNOSIS — S0292XA Unspecified fracture of facial bones, initial encounter for closed fracture: Secondary | ICD-10-CM | POA: Diagnosis not present

## 2017-11-13 DIAGNOSIS — J449 Chronic obstructive pulmonary disease, unspecified: Secondary | ICD-10-CM | POA: Diagnosis not present

## 2017-11-13 MED ORDER — DIPHENHYDRAMINE HCL 50 MG/ML IJ SOLN
25.0000 mg | Freq: Once | INTRAMUSCULAR | Status: AC
  Administered 2017-11-13: 25 mg via INTRAVENOUS
  Filled 2017-11-13: qty 1

## 2017-11-13 MED ORDER — PROCHLORPERAZINE EDISYLATE 5 MG/ML IJ SOLN
10.0000 mg | Freq: Once | INTRAMUSCULAR | Status: AC
  Administered 2017-11-13: 10 mg via INTRAVENOUS
  Filled 2017-11-13: qty 2

## 2017-11-13 MED ORDER — SODIUM CHLORIDE 0.9 % IV BOLUS (SEPSIS)
1000.0000 mL | Freq: Once | INTRAVENOUS | Status: AC
  Administered 2017-11-13: 1000 mL via INTRAVENOUS

## 2017-11-13 NOTE — ED Triage Notes (Signed)
Pt states "I fell back on feb 26th, it was a really bad fall and I did not go see anyone about it. I had a golf ball size knot on the side of my head, my face was swollen. The swelling has gone down, most of it, but I've still got a headache and it wont go away. I had been to the dentist that day and they took my blood pressure and it was really low, I think what I had done was taken too much blood pressure medicine that day. I dont remember falling, I dont remember hitting the floor, I just remember waking up on the floor." Pt is AAOX4, VSS. Pt has history of two hemorraghic strokes, in march 2011. Pt is taking a blood thinner, plavix

## 2017-11-13 NOTE — ED Triage Notes (Signed)
Pt had a fall in February and has had persistent headaches since. Pt alert and oriented in triage but still has some bruising on the left side of her head. Pt takes plavix.

## 2017-11-13 NOTE — ED Provider Notes (Addendum)
[facility] EMERGENCY DEPARTMENT Provider Note   CSN: [id number] Arrival date & time: 11/13/17  1350     History   Chief Complaint Chief Complaint  Patient presents with  . Headache  . Fall    HPI [name] is a 62 y.o. female.  HPI   [name] is a 62yo female with history of ICH (2011), cerebral aneurysm (s/p stenting 2014), gait abnormality, seizures, COPD, hypertension, headaches who presents to the emergency department for evaluation of constant headache.  Patient reports that she had a fall with loss of consciousness 10/31/2017.  Reports hitting her head in the bathroom, reports that she believes it was due to taking double her blood pressure medication that day by accident.  The fall was unwitnessed, but her husband was in the house and came to her rescue.  She immediately woke up and did not have any confusion.  She reports that since that fall she has had a constant bitemporal headache which is 7/10 in severity and described as aching.  States that she had bruising on the right side of her head and around her eye which has improved.  Her headache is worsened somewhat with light.  She denies any nausea or vomiting.  States that she tried taking Aleve with subsequent improvement.  She also reports 8/10 severity right sided neck pain which is worsened with lateral neck flexion.  She thinks this may be due to the fact that she has tried not to sleep on her right side since the fall.  She has had residual numbness and weakness on her right side since her stroke in 2012, denies any change.  She has been off balance for several years now, denies change.  Uses a cane at home to ambulate.  She denies fevers/chills, visual changes, new numbness or weakness, dysarthria, dysphagia, chest pain, shortness of breath, abdominal pain, dysuria.  She does take Plavix.  Past Medical History:  Diagnosis Date  . Abnormal gait   . Amnesia   . Anemia    hx  .  Aneurysm ([place])   . Anxiety   . Asthma   . Atrophic vaginitis   . Benign hypertensive heart disease   . Cardiomegaly   . Chest pain   . COPD (chronic obstructive pulmonary disease) ([place])     - PFTs  01/24/06 FEV1 69% ratio 68% diffusing capacity 57% with no improvement after B2   - PFT's 10/1/ 09 FEV1 77   ratio  76    dlco                   45            no resp to B2    - Nl alpha one antitripsin level 10/09  . Cough   . Depression   . Difficulty speaking   . Disorder of nasal cavity   . Dyspnea   . Enthesopathy of elbow region   . GERD (gastroesophageal reflux disease)   . Hammer toe   . Headache(784.0)   . Hemiparesis affecting dominant side as late effect of cerebrovascular accident ([place])   . History of kidney stones   . Hyperlipidemia   . Hypertension   . Hypothyroidism   . Increased urinary frequency   . Low compliance bladder   . Malaise and fatigue   . Nonruptured cerebral aneurysm   . Obesity   . Pneumonia    hx  . Pure hypercholesterolemia   . Recurrent  major depressive episodes ([place])   . Seizure disorder, secondary ([place])    2-3 focal siezures monthly  . Seizures ([place])   . Short-term memory loss    since stroke  . Skin sensation disturbance   . Stroke [facility]) 2011   rt sided weakness  . Swelling of limb   . TMJ (temporomandibular joint disorder)   . Urinary tract infectious disease   . Vertigo as late effect of stroke   . Vitamin D deficiency     Patient Active Problem List   Diagnosis Date Noted  . Seizure disorder ([place]) 01/12/2017  . Common migraine 01/12/2017  . Aneurysm ([place])   . Brain aneurysm 08/21/2013  . Right foot drop 04/10/2013  . Intracerebral hemorrhage ([place]) 03/19/2013  . Convulsions ([place]) 03/19/2013  . Headache 03/19/2013  . Abnormality of gait 03/19/2013  . Edema 02/23/2011  . COUGH 06/10/2008  . HYPERLIPIDEMIA 04/25/2008  . TOBACCO ABUSE 04/25/2008  . ASTHMA 04/25/2008  . C O P D 04/25/2008    Past Surgical History:  Procedure  Laterality Date  . BACK SURGERY    . CEREBRAL ANGIOGRAM     x3  . COLONOSCOPY WITH PROPOFOL N/A 08/18/2016   Procedure: COLONOSCOPY WITH PROPOFOL;  Surgeon: Carol Ada, MD;  Location: [facility];  Service: Endoscopy;  Laterality: N/A;  . LAPAROSCOPIC HYSTERECTOMY    . MANDIBLE FRACTURE SURGERY    . RADIOLOGY WITH ANESTHESIA N/A 08/21/2013   Procedure: RADIOLOGY WITH ANESTHESIA;  Surgeon: Rob Hickman, MD;  Location: [facility];  Service: Radiology;  Laterality: N/A;  . stones     no surgery    OB History    No data available       Home Medications    Prior to Admission medications   Medication Sig Start Date End Date Taking? Authorizing Provider  ALPRAZolam (XANAX) 0.25 MG tablet Take 0.25 mg by mouth 2 (two) times daily as needed for anxiety or sleep.     [provider]  B Complex-C (SUPER B COMPLEX PO) Take 1 tablet by mouth daily.    [provider]  Biotin w/ Vitamins C & E (HAIR SKIN & NAILS GUMMIES PO) Take 1 capsule by mouth 2 (two) times daily.    [provider]  Calcium Carb-Cholecalciferol (CALCIUM 500 +D) 500-400 MG-UNIT TABS Take 1 tablet by mouth daily.    [provider]  citalopram (CELEXA) 40 MG tablet Take 40 mg by mouth at bedtime.    [provider]  clindamycin (CLEOCIN T) 1 % external solution Apply 1 application topically 2 (two) times daily. Apply to face 06/06/13   [provider]  clopidogrel (PLAVIX) 75 MG tablet Take 75 mg by mouth daily with breakfast.    [provider]  Cranberry-Cholecalciferol (SUPER CRANBERRY/VITAMIN D3 PO) Take 1 tablet by mouth 2 (two) times daily.     [provider]  Cyanocobalamin (VITAMIN B-12 PO) Take 1,000 mcg by mouth every morning.    [provider]  cyclobenzaprine (FLEXERIL) 10 MG tablet Take 10 mg by mouth at bedtime.     [provider]  diclofenac sodium (VOLTAREN) 1 % GEL Apply 2 g topically 3 (three) times daily as  needed. For pain    [provider]  famotidine (PEPCID) 20 MG tablet Take 20 mg by mouth 2 (two) times daily.     [provider]  levETIRAcetam (KEPPRA) 500 MG tablet Take 1 tablet (500 mg total) by mouth every 12 (twelve) hours.  09/18/17   Garvin Fila, MD  levothyroxine (SYNTHROID, LEVOTHROID) 50 MCG tablet Take 50 mcg by mouth daily before breakfast.  03/08/13   [provider]  levothyroxine (SYNTHROID, LEVOTHROID) 75 MCG tablet TK 1 T PO ON SUNDAYS ONLY 09/08/17   [provider]  losartan (COZAAR) 100 MG tablet Take 100 mg by mouth every morning.     [provider]  Multiple Vitamins-Minerals (HAIR/SKIN/NAILS) TABS Take 1 tablet by mouth 2 (two) times daily.    [provider]  Omega-3 Fatty Acids (FISH OIL) 1000 MG CAPS Take 1,000 mg by mouth daily.    [provider]  rosuvastatin (CRESTOR) 10 MG tablet Take 10 mg by mouth at bedtime.     [provider]  topiramate (TOPAMAX) 25 MG tablet TAKE 1 TABLET IN THE MORNING AND 2 TABLETS IN THE EVENING 01/12/17   Dennie Bible, NP  tretinoin (RETIN-A) 0.05 % cream APP ON THE SKIN NIGHTLY 10/21/15   [provider]    Family History Family History  Problem Relation Age of Onset  . Heart disease Mother   . Heart disease Father   . Heart attack Father   . Lung cancer Unknown     Social History Social History   Tobacco Use  . Smoking status: Former Smoker    Last attempt to quit: 09/05/2008    Years since quitting: 9.1  . Smokeless tobacco: Never Used  Substance Use Topics  . Alcohol use: No    Alcohol/week: 0.0 oz    Comment: patient quit alcohol use 2005.   . Drug use: No     Allergies   Phenytoin   Review of Systems Review of Systems  Constitutional: Negative for chills and fever.  HENT: Negative for congestion.   Eyes: Positive for photophobia. Negative for visual disturbance.  Respiratory: Negative for shortness of breath.     Cardiovascular: Negative for chest pain.  Gastrointestinal: Negative for abdominal pain, nausea and vomiting.  Genitourinary: Negative for difficulty urinating, dysuria, frequency and hematuria.  Musculoskeletal: Positive for gait problem (chronic imbalance since previous stroke) and neck pain (right side).  Skin: Negative for rash.  Neurological: Positive for weakness (since previous stroke), numbness (chronic right arm and leg numbness from previous stroke) and headaches. Negative for dizziness, speech difficulty and light-headedness.  Psychiatric/Behavioral: Negative for agitation.     Physical Exam Updated Vital Signs BP 127/85   Pulse 90   Temp 98.7 F (37.1 C) (Oral)   Resp 16   SpO2 97%   Physical Exam  Constitutional: She is oriented to person, place, and time. She appears well-developed and well-nourished. No distress.  Nontoxic, no acute distress.  HENT:  Head: Normocephalic.  Mouth/Throat: Oropharynx is clear and moist. No oropharyngeal exudate.  Old yellowing ecchymosis noted around left eye and over left temple.  No break in skin.  Not acutely tender to palpation.  No facial swelling.  Bilateral TMs with good cone of light.  Mucous membranes moist.  Uvula midline.  Eyes: Conjunctivae and EOM are normal. Pupils are equal, round, and reactive to light. Right eye exhibits no discharge. Left eye exhibits no discharge.  Neck: Normal range of motion. Neck supple.  Painful to palpation over right sternocleidomastoid muscle.  No overlying ecchymosis, erythema, warmth, rash or break in skin.  Full active range of motion of the neck, although painful.  Cardiovascular: Normal rate, regular rhythm and intact distal pulses. Exam reveals no friction rub.  No murmur heard. Pulmonary/Chest:  Effort normal and breath sounds normal. No stridor. No respiratory distress. She has no wheezes. She has no rales.  Abdominal: Soft. Bowel sounds are normal. There is no tenderness. There is no  guarding.  Musculoskeletal: Normal range of motion.  Lymphadenopathy:    She has no cervical adenopathy.  Neurological: She is alert and oriented to person, place, and time. Coordination normal.  Mental Status:  Alert, oriented, thought content appropriate, able to give a coherent history. Speech fluent without evidence of aphasia. Able to follow 2 step commands without difficulty.  Cranial Nerves:  II:  Peripheral visual fields grossly normal, pupils equal, round, reactive to light III,IV, VI: ptosis not present, extra-ocular motions intact bilaterally  V,VII: smile symmetric, facial light touch sensation equal VIII: hearing grossly normal to voice  X: uvula elevates symmetrically  XI: bilateral shoulder shrug symmetric and strong XII: midline tongue extension without fassiculations Motor:  Normal tone. 5/5 in upper and lower extremities bilaterally including strong and equal grip strength and dorsiflexion/plantar flexion Sensory: Patient cannot feel light touch in right distal upper and lower extremity. Light touch intact in distal left upper and lower extremity.  Deep Tendon Reflexes: 2+ and symmetric patellar reflex Cerebellar: Finger to nose ataxic with right hand. Normal in left. Gait: Uses cane for assistance, able to walk independently although somewhat unbalanced. CV: distal pulses palpable throughout   Skin: Skin is warm and dry. She is not diaphoretic.  Psychiatric: She has a normal mood and affect. Her behavior is normal.  Nursing note and vitals reviewed.    ED Treatments / Results  Labs (all labs ordered are listed, but only abnormal results are displayed) Labs Reviewed - No data to display  EKG  EKG Interpretation None       Radiology Ct Head Wo Contrast  Result Date: 11/13/2017 CLINICAL DATA:  Facial trauma with facial fractures. Headache. [place] in February. Left-sided bruising. History of hemorrhagic stroke. EXAM: CT HEAD WITHOUT CONTRAST CT MAXILLOFACIAL  WITHOUT CONTRAST CT CERVICAL SPINE WITHOUT CONTRAST TECHNIQUE: Multidetector CT imaging of the head, cervical spine, and maxillofacial structures were performed using the standard protocol without intravenous contrast. Multiplanar CT image reconstructions of the cervical spine and maxillofacial structures were also generated. COMPARISON:  MRI 09/29/2017 FINDINGS: CT HEAD FINDINGS Brain: Old infarction in the left parietal region with atrophy, encephalomalacia and ex vacuo enlargement of the left lateral ventricle. No sign of acute infarction, mass lesion, hemorrhage, obstructive hydrocephalus or extra-axial collection. Vascular: There is atherosclerotic calcification of the major vessels at the base of the brain. There is a vertebrobasilar system stent. Skull: No skull fracture. Other: None CT MAXILLOFACIAL FINDINGS Osseous: No evidence of facial fracture. Chronic dental and periodontal disease is noted. Bilateral temporomandibular joint osteoarthritis. Orbits: Orbits are negative. Sinuses: No fluid in the sinuses. Soft tissues: No evidence of significant soft tissue injury by CT. CT CERVICAL SPINE FINDINGS Alignment: Straightening of the normal cervical lordosis. Mild curvature convex to the left. Skull base and vertebrae: No fracture or primary bone lesion. Soft tissues and spinal canal: No sign of neck soft tissue injury or lesion. Disc levels: Ordinary osteoarthritis at the C1-2 articulation. C2-3 is negative. C3-4 shows mild spondylosis without significant stenosis. C4-5 shows more advanced spondylosis with osteophytic encroachment upon the canal and foramina. C5-6 also shows pronounced spondylosis with osteophytic encroachment upon the canal and foramina. Lesser spondylosis at C6-7 with mild bilateral foraminal narrowing. C7-T1 is negative. Upper chest: Negative Other: None IMPRESSION: Head CT: No acute or traumatic finding.  Old left parietal infarction with atrophy and encephalomalacia. Vertebrobasilar system  stent. CT maxillofacial: No evidence of facial fracture. Chronic dental and periodontal disease. Bilateral temporomandibular osteoarthritis. Cervical spine CT: No acute or traumatic finding. Mid cervical spondylosis, most advanced at C4-5 and C5-6 where there is osteophytic encroachment upon the canal and foramina. Electronically Signed   By: Nelson Chimes M.D.   On: 11/13/2017 15:40   Ct Cervical Spine Wo Contrast  Result Date: 11/13/2017 CLINICAL DATA:  Facial trauma with facial fractures. Headache. [place] in February. Left-sided bruising. History of hemorrhagic stroke. EXAM: CT HEAD WITHOUT CONTRAST CT MAXILLOFACIAL WITHOUT CONTRAST CT CERVICAL SPINE WITHOUT CONTRAST TECHNIQUE: Multidetector CT imaging of the head, cervical spine, and maxillofacial structures were performed using the standard protocol without intravenous contrast. Multiplanar CT image reconstructions of the cervical spine and maxillofacial structures were also generated. COMPARISON:  MRI 09/29/2017 FINDINGS: CT HEAD FINDINGS Brain: Old infarction in the left parietal region with atrophy, encephalomalacia and ex vacuo enlargement of the left lateral ventricle. No sign of acute infarction, mass lesion, hemorrhage, obstructive hydrocephalus or extra-axial collection. Vascular: There is atherosclerotic calcification of the major vessels at the base of the brain. There is a vertebrobasilar system stent. Skull: No skull fracture. Other: None CT MAXILLOFACIAL FINDINGS Osseous: No evidence of facial fracture. Chronic dental and periodontal disease is noted. Bilateral temporomandibular joint osteoarthritis. Orbits: Orbits are negative. Sinuses: No fluid in the sinuses. Soft tissues: No evidence of significant soft tissue injury by CT. CT CERVICAL SPINE FINDINGS Alignment: Straightening of the normal cervical lordosis. Mild curvature convex to the left. Skull base and vertebrae: No fracture or primary bone lesion. Soft tissues and spinal canal: No sign of  neck soft tissue injury or lesion. Disc levels: Ordinary osteoarthritis at the C1-2 articulation. C2-3 is negative. C3-4 shows mild spondylosis without significant stenosis. C4-5 shows more advanced spondylosis with osteophytic encroachment upon the canal and foramina. C5-6 also shows pronounced spondylosis with osteophytic encroachment upon the canal and foramina. Lesser spondylosis at C6-7 with mild bilateral foraminal narrowing. C7-T1 is negative. Upper chest: Negative Other: None IMPRESSION: Head CT: No acute or traumatic finding. Old left parietal infarction with atrophy and encephalomalacia. Vertebrobasilar system stent. CT maxillofacial: No evidence of facial fracture. Chronic dental and periodontal disease. Bilateral temporomandibular osteoarthritis. Cervical spine CT: No acute or traumatic finding. Mid cervical spondylosis, most advanced at C4-5 and C5-6 where there is osteophytic encroachment upon the canal and foramina. Electronically Signed   By: Nelson Chimes M.D.   On: 11/13/2017 15:40   Ct Maxillofacial Wo Contrast  Result Date: 11/13/2017 CLINICAL DATA:  Facial trauma with facial fractures. Headache. [place] in February. Left-sided bruising. History of hemorrhagic stroke. EXAM: CT HEAD WITHOUT CONTRAST CT MAXILLOFACIAL WITHOUT CONTRAST CT CERVICAL SPINE WITHOUT CONTRAST TECHNIQUE: Multidetector CT imaging of the head, cervical spine, and maxillofacial structures were performed using the standard protocol without intravenous contrast. Multiplanar CT image reconstructions of the cervical spine and maxillofacial structures were also generated. COMPARISON:  MRI 09/29/2017 FINDINGS: CT HEAD FINDINGS Brain: Old infarction in the left parietal region with atrophy, encephalomalacia and ex vacuo enlargement of the left lateral ventricle. No sign of acute infarction, mass lesion, hemorrhage, obstructive hydrocephalus or extra-axial collection. Vascular: There is atherosclerotic calcification of the major  vessels at the base of the brain. There is a vertebrobasilar system stent. Skull: No skull fracture. Other: None CT MAXILLOFACIAL FINDINGS Osseous: No evidence of facial fracture. Chronic dental and periodontal disease is noted. Bilateral temporomandibular joint osteoarthritis. Orbits:  Orbits are negative. Sinuses: No fluid in the sinuses. Soft tissues: No evidence of significant soft tissue injury by CT. CT CERVICAL SPINE FINDINGS Alignment: Straightening of the normal cervical lordosis. Mild curvature convex to the left. Skull base and vertebrae: No fracture or primary bone lesion. Soft tissues and spinal canal: No sign of neck soft tissue injury or lesion. Disc levels: Ordinary osteoarthritis at the C1-2 articulation. C2-3 is negative. C3-4 shows mild spondylosis without significant stenosis. C4-5 shows more advanced spondylosis with osteophytic encroachment upon the canal and foramina. C5-6 also shows pronounced spondylosis with osteophytic encroachment upon the canal and foramina. Lesser spondylosis at C6-7 with mild bilateral foraminal narrowing. C7-T1 is negative. Upper chest: Negative Other: None IMPRESSION: Head CT: No acute or traumatic finding. Old left parietal infarction with atrophy and encephalomalacia. Vertebrobasilar system stent. CT maxillofacial: No evidence of facial fracture. Chronic dental and periodontal disease. Bilateral temporomandibular osteoarthritis. Cervical spine CT: No acute or traumatic finding. Mid cervical spondylosis, most advanced at C4-5 and C5-6 where there is osteophytic encroachment upon the canal and foramina. Electronically Signed   By: Nelson Chimes M.D.   On: 11/13/2017 15:40    Procedures Procedures (including critical care time)  Medications Ordered in ED Medications  prochlorperazine (COMPAZINE) injection 10 mg (10 mg Intravenous Given 11/13/17 2115)  diphenhydrAMINE (BENADRYL) injection 25 mg (25 mg Intravenous Given 11/13/17 2115)  sodium chloride 0.9 % bolus  1,000 mL (0 mLs Intravenous Stopped 11/13/17 2210)     Initial Impression / Assessment and Plan / ED Course  I have reviewed the triage vital signs and the nursing notes.  Pertinent labs & imaging results that were available during my care of the patient were reviewed by me and considered in my medical decision making (see chart for details).     With persistent headache after having a syncopal episode over 2 weeks ago.  She has a history of CVA from [place].  Patient is on Plavix.  She has chronic gait and balance and right-sided weakness which is unchanged from baseline.  She denies nausea/vomiting.  On exam she is afebrile and nontoxic-appearing.  She has old bruising patterns over the left temple and eye.  No focal point tenderness over the face.  She does have right-sided numbness and ataxia with finger to nose on the right hand.  Patient reports that this is chronic and has been an ongoing issue for her since her previous stroke.  Denies any new neurological symptoms.  CT head, cervical spine and maxillofacial scan without acute abnormality.  She has an old left parietal infarction and vertebrobasilar stent.  No evidence of facial fracture.  No history of worst headache of her life or sudden onset, do not suspect SAH.  No fever or nuchal rigidity to suggest meningitis.  Exam on concerning for temporal arteritis.  Her right-sided neck pain is likely musculoskeletal in the setting of her positioning her head so that she is not laying on the right side.  Counseled her on use of heat over the neck and Tylenol for pain. Headache treated and improved in the emergency department with Compazine and Benadryl.  Counseled her on return precautions and patient agrees at the bedside.  Discussed need for her to follow-up with her primary care doctor for recheck and for management of her ongoing headaches.Discussed this patient with Dr. Verner Chol who agrees with the above plan.  Final Clinical Impressions(s) /  ED Diagnoses   Final diagnoses:  New daily persistent headache  ED Discharge Orders    None       Glyn Ade, PA-C 11/14/17 0134    Glyn Ade, PA-C 11/14/17 5423    Sherwood Gambler, MD 11/16/17 [phone]

## 2017-11-13 NOTE — ED Provider Notes (Signed)
Patient placed in [facility] pathway, seen and evaluated   Chief Complaint: fall, head injury  HPI:   Pt had a fall on Feb 26th with head injury. No LOC. States low blood pressure that day, states mixed up her BP medications, she thinks. No dizziness or lightheadiness  ROS: positive for headache, neck pain. Negative for dizziness, lightheadiness, weakness.   Physical Exam:   Gen: No distress  Neuro: Awake and Alert  Skin: Warm    Focused Exam: bruising noted over right periorbital area, right mandible. ttp over midline cervical spine. PERRLA. Moving all extremities. Grip strength 5/5 equal.   Pt with a fall 2 weeks ago. Severe headache and neck pain since. Ct head/cervical spine, max facial ordered. Pt with hx of hemorrhagic bleeds and aneurisms, also on plavix.   Vitals:   11/13/17 1358  BP: 121/82  Pulse: 87  Resp: 18  Temp: 97.6 F (36.4 C)  TempSrc: Oral  SpO2: 100%      Initiation of care has begun. The patient has been counseled on the process, plan, and necessity for staying for the completion/evaluation, and the remainder of the medical screening examination    Jeannett Senior, Hershal Coria 11/13/17 1423    Jola Schmidt, MD 11/13/17 1450

## 2017-11-13 NOTE — ED Notes (Signed)
[name] spoke with patient, sent her to the ER.

## 2017-11-13 NOTE — Discharge Instructions (Signed)
The CT scan of your face, head and neck were very reassuring.  No facial or skull fractures, no intracranial abnormality.  Use Tylenol for neck soreness and you can also apply heat over the neck to help with your symptoms.  Please follow-up with your regular doctor for follow-up of your ER visit and for further headache management.  Return to the emergency department if you have sudden worst headache of your life, headache with new numbness or weakness, headache with fever, headache with vision changes or have any new or concerning symptoms.

## 2017-11-15 ENCOUNTER — Ambulatory Visit (HOSPITAL_COMMUNITY)
Admission: RE | Admit: 2017-11-15 | Discharge: 2017-11-15 | Disposition: A | Discharge status: Home / Self Care | Payer: Medicare Other | Source: Ambulatory Visit | Attending: Nurse Practitioner | Admitting: Nurse Practitioner

## 2017-11-15 DIAGNOSIS — K224 Dyskinesia of esophagus: Secondary | ICD-10-CM | POA: Insufficient documentation

## 2017-11-15 DIAGNOSIS — R1310 Dysphagia, unspecified: Secondary | ICD-10-CM | POA: Insufficient documentation

## 2017-11-17 ENCOUNTER — Telehealth (HOSPITAL_COMMUNITY): Payer: Self-pay | Admitting: Radiology

## 2017-11-17 NOTE — Telephone Encounter (Signed)
Called pt. She will follow-up in one year with MRA/MRI. She will be due in January 2020. Pt agrees to this plan of care. JM

## 2017-12-12 DIAGNOSIS — E039 Hypothyroidism, unspecified: Secondary | ICD-10-CM | POA: Diagnosis not present

## 2018-01-09 DIAGNOSIS — N390 Urinary tract infection, site not specified: Secondary | ICD-10-CM | POA: Diagnosis not present

## 2018-01-09 DIAGNOSIS — R7309 Other abnormal glucose: Secondary | ICD-10-CM | POA: Diagnosis not present

## 2018-01-09 DIAGNOSIS — R51 Headache: Secondary | ICD-10-CM | POA: Diagnosis not present

## 2018-01-09 DIAGNOSIS — I10 Essential (primary) hypertension: Secondary | ICD-10-CM | POA: Diagnosis not present

## 2018-01-09 DIAGNOSIS — F40232 Fear of other medical care: Secondary | ICD-10-CM | POA: Diagnosis not present

## 2018-01-09 DIAGNOSIS — Z87440 Personal history of urinary (tract) infections: Secondary | ICD-10-CM | POA: Diagnosis not present

## 2018-01-09 DIAGNOSIS — R350 Frequency of micturition: Secondary | ICD-10-CM | POA: Diagnosis not present

## 2018-03-20 ENCOUNTER — Other Ambulatory Visit: Payer: Self-pay | Admitting: Nurse Practitioner

## 2018-03-21 ENCOUNTER — Other Ambulatory Visit: Payer: Self-pay | Admitting: Internal Medicine

## 2018-03-21 DIAGNOSIS — E2839 Other primary ovarian failure: Secondary | ICD-10-CM

## 2018-03-21 DIAGNOSIS — Z1231 Encounter for screening mammogram for malignant neoplasm of breast: Secondary | ICD-10-CM

## 2018-03-27 ENCOUNTER — Other Ambulatory Visit: Payer: Self-pay | Admitting: Family Medicine

## 2018-03-27 ENCOUNTER — Ambulatory Visit
Admission: RE | Admit: 2018-03-27 | Discharge: 2018-03-27 | Disposition: A | Discharge status: Home / Self Care | Payer: Medicare Other | Source: Ambulatory Visit | Attending: Family Medicine | Admitting: Family Medicine

## 2018-03-27 DIAGNOSIS — R05 Cough: Secondary | ICD-10-CM

## 2018-03-27 DIAGNOSIS — R0982 Postnasal drip: Secondary | ICD-10-CM | POA: Diagnosis not present

## 2018-03-27 DIAGNOSIS — R0781 Pleurodynia: Secondary | ICD-10-CM | POA: Diagnosis not present

## 2018-03-27 DIAGNOSIS — R053 Chronic cough: Secondary | ICD-10-CM

## 2018-03-29 DIAGNOSIS — Z8673 Personal history of transient ischemic attack (TIA), and cerebral infarction without residual deficits: Secondary | ICD-10-CM | POA: Insufficient documentation

## 2018-04-06 DIAGNOSIS — H524 Presbyopia: Secondary | ICD-10-CM | POA: Diagnosis not present

## 2018-04-06 DIAGNOSIS — H52223 Regular astigmatism, bilateral: Secondary | ICD-10-CM | POA: Diagnosis not present

## 2018-04-06 DIAGNOSIS — H04123 Dry eye syndrome of bilateral lacrimal glands: Secondary | ICD-10-CM | POA: Diagnosis not present

## 2018-04-06 DIAGNOSIS — H353131 Nonexudative age-related macular degeneration, bilateral, early dry stage: Secondary | ICD-10-CM | POA: Diagnosis not present

## 2018-04-06 DIAGNOSIS — Z79899 Other long term (current) drug therapy: Secondary | ICD-10-CM | POA: Diagnosis not present

## 2018-04-06 DIAGNOSIS — H2513 Age-related nuclear cataract, bilateral: Secondary | ICD-10-CM | POA: Diagnosis not present

## 2018-04-24 DIAGNOSIS — H353132 Nonexudative age-related macular degeneration, bilateral, intermediate dry stage: Secondary | ICD-10-CM | POA: Diagnosis not present

## 2018-05-01 ENCOUNTER — Ambulatory Visit
Admission: RE | Admit: 2018-05-01 | Discharge: 2018-05-01 | Disposition: A | Discharge status: Home / Self Care | Payer: Medicare Other | Source: Ambulatory Visit | Attending: Internal Medicine | Admitting: Internal Medicine

## 2018-05-01 DIAGNOSIS — Z780 Asymptomatic menopausal state: Secondary | ICD-10-CM | POA: Diagnosis not present

## 2018-05-01 DIAGNOSIS — Z13820 Encounter for screening for osteoporosis: Secondary | ICD-10-CM | POA: Diagnosis not present

## 2018-05-01 DIAGNOSIS — Z1231 Encounter for screening mammogram for malignant neoplasm of breast: Secondary | ICD-10-CM | POA: Diagnosis not present

## 2018-05-01 DIAGNOSIS — E2839 Other primary ovarian failure: Secondary | ICD-10-CM

## 2018-05-24 DIAGNOSIS — H353132 Nonexudative age-related macular degeneration, bilateral, intermediate dry stage: Secondary | ICD-10-CM | POA: Diagnosis not present

## 2018-06-08 ENCOUNTER — Telehealth: Payer: Self-pay

## 2018-06-08 ENCOUNTER — Other Ambulatory Visit: Payer: Self-pay

## 2018-06-08 MED ORDER — ALPRAZOLAM 0.25 MG PO TABS: 0.2500 mg | ORAL_TABLET | Freq: Two times a day (BID) | ORAL | 2 refills | Status: DC

## 2018-06-08 NOTE — Telephone Encounter (Signed)
Patient notified that Alprazolam refill faxed to [facility].

## 2018-06-23 DIAGNOSIS — H353132 Nonexudative age-related macular degeneration, bilateral, intermediate dry stage: Secondary | ICD-10-CM | POA: Diagnosis not present

## 2018-07-19 ENCOUNTER — Other Ambulatory Visit: Payer: Self-pay | Admitting: Nurse Practitioner

## 2018-07-23 DIAGNOSIS — H353132 Nonexudative age-related macular degeneration, bilateral, intermediate dry stage: Secondary | ICD-10-CM | POA: Diagnosis not present

## 2018-08-13 ENCOUNTER — Other Ambulatory Visit: Payer: Self-pay

## 2018-08-22 DIAGNOSIS — H353132 Nonexudative age-related macular degeneration, bilateral, intermediate dry stage: Secondary | ICD-10-CM | POA: Diagnosis not present

## 2018-09-03 ENCOUNTER — Ambulatory Visit (INDEPENDENT_AMBULATORY_CARE_PROVIDER_SITE_OTHER): Payer: Medicare Other | Admitting: Nurse Practitioner

## 2018-09-03 VITALS — HR 84 | Temp 97.3°F | Ht 61.4 in | Wt 164.4 lb

## 2018-09-03 DIAGNOSIS — I10 Essential (primary) hypertension: Secondary | ICD-10-CM | POA: Diagnosis not present

## 2018-09-03 DIAGNOSIS — Z0000 Encounter for general adult medical examination without abnormal findings: Secondary | ICD-10-CM

## 2018-09-03 DIAGNOSIS — E039 Hypothyroidism, unspecified: Secondary | ICD-10-CM

## 2018-09-03 DIAGNOSIS — Z87891 Personal history of nicotine dependence: Secondary | ICD-10-CM | POA: Diagnosis not present

## 2018-09-03 DIAGNOSIS — J449 Chronic obstructive pulmonary disease, unspecified: Secondary | ICD-10-CM | POA: Diagnosis not present

## 2018-09-03 DIAGNOSIS — R7303 Prediabetes: Secondary | ICD-10-CM | POA: Diagnosis not present

## 2018-09-03 LAB — POCT UA - MICROALBUMIN
Albumin/Creatinine Ratio, Urine, POC: 30
Creatinine, POC: 200 mg/dL
Microalbumin Ur, POC: 10 mg/L

## 2018-09-03 LAB — POCT URINALYSIS DIPSTICK
Bilirubin, UA: NEGATIVE
Blood, UA: NEGATIVE
Glucose, UA: NEGATIVE
Ketones, UA: NEGATIVE
Nitrite, UA: NEGATIVE
Protein, UA: NEGATIVE
Spec Grav, UA: 1.015 (ref 1.010–1.025)
Urobilinogen, UA: 0.2 E.U./dL
pH, UA: 5.5 (ref 5.0–8.0)

## 2018-09-03 MED ORDER — ALBUTEROL SULFATE HFA 108 (90 BASE) MCG/ACT IN AERS: 2.0000 | INHALATION_SPRAY | Freq: Four times a day (QID) | RESPIRATORY_TRACT | 2 refills | Status: DC | PRN

## 2018-09-03 NOTE — Progress Notes (Signed)
Subjective:     Patient ID: [name] , female    DOB: [birth year] , 62 y.o.   MRN: [id number]   Chief Complaint  Patient presents with  . Medicare Wellness   The patient states she uses status post hysterectomy for birth control. Last LMP was No LMP recorded. Patient has had a hysterectomy.. Negative for Dysmenorrhea and Negative for Menorrhagia Mammogram last done 05/01/18 Negative for: breast discharge, breast lump(s), breast pain and breast self exam.  Pertinent negatives include abnormal bleeding (hematology), anxiety, decreased libido, depression, difficulty falling sleep, dyspareunia, history of infertility, nocturia, sexual dysfunction, sleep disturbances, urinary incontinence, urinary urgency, vaginal discharge and vaginal itching. Diet regular.The patient states her exercise level is  none  The patient's tobacco use is:    Social History   Tobacco Use  Smoking Status Former Smoker  . Last attempt to quit: 09/05/2008  . Years since quitting: 10.0  Smokeless Tobacco Never Used  . She has been exposed to passive smoke. The patient's alcohol use is:  Social History   Substance and Sexual Activity  Alcohol Use No  . Alcohol/week: 0.0 standard drinks   Comment: patient quit alcohol use 2005.   . Additional information: Last pap 09/01/2017, next one scheduled for 2021 .  HPI  Here for Medicare Annual Wellness  Hypertension  This is a chronic problem. The current episode started more than 1 year ago. The problem is unchanged. The problem is controlled. Pertinent negatives include no anxiety, chest pain, headaches, malaise/fatigue, palpitations or shortness of breath. There are no associated agents to hypertension. Risk factors for coronary artery disease include sedentary lifestyle.  Cough  This is a chronic problem. The current episode started more than 1 month ago. The problem has been unchanged. The cough is non-productive. Pertinent negatives include no chest pain,  headaches or shortness of breath. Nothing aggravates the symptoms. There is no history of asthma or COPD.     Past Medical History:  Diagnosis Date  . Abnormal gait   . Amnesia   . Anemia    hx  . Aneurysm ([place])   . Anxiety   . Asthma   . Atrophic vaginitis   . Benign hypertensive heart disease   . Cardiomegaly   . Chest pain   . COPD (chronic obstructive pulmonary disease) ([place])     - PFTs  01/24/06 FEV1 69% ratio 68% diffusing capacity 57% with no improvement after B2   - PFT's 10/1/ 09 FEV1 77   ratio  76    dlco                   45            no resp to B2    - Nl alpha one antitripsin level 10/09  . Cough   . Depression   . Difficulty speaking   . Disorder of nasal cavity   . Dyspnea   . Enthesopathy of elbow region   . GERD (gastroesophageal reflux disease)   . Hammer toe   . Headache(784.0)   . Hemiparesis affecting dominant side as late effect of cerebrovascular accident ([place])   . History of kidney stones   . Hyperlipidemia   . Hypertension   . Hypothyroidism   . Increased urinary frequency   . Low compliance bladder   . Malaise and fatigue   . Nonruptured cerebral aneurysm   . Obesity   . Pneumonia    hx  . Pure hypercholesterolemia   .  Recurrent major depressive episodes ([place])   . Seizure disorder, secondary ([place])    2-3 focal siezures monthly  . Seizures ([place])   . Short-term memory loss    since stroke  . Skin sensation disturbance   . Stroke [facility]) 2011   rt sided weakness  . Swelling of limb   . TMJ (temporomandibular joint disorder)   . Urinary tract infectious disease   . Vertigo as late effect of stroke   . Vitamin D deficiency      Family History  Problem Relation Age of Onset  . Heart disease Mother   . Heart disease Father   . Heart attack Father   . Cancer Father        prostate to lymph node  . Lung cancer Other   . Breast cancer Neg Hx      Current Outpatient Medications:  .  ALPRAZolam (XANAX) 0.25 MG tablet, Take 1 tablet  (0.25 mg total) by mouth 2 (two) times daily., Disp: 90 tablet, Rfl: 2 .  aspirin EC 81 MG tablet, Take 81 mg by mouth daily., Disp: , Rfl:  .  B Complex-C (SUPER B COMPLEX PO), Take 1 tablet by mouth daily., Disp: , Rfl:  .  Cholecalciferol (VITAMIN D-3) 1000 units CAPS, Take 1,000 Units by mouth daily., Disp: , Rfl:  .  citalopram (CELEXA) 40 MG tablet, Take 40 mg by mouth at bedtime., Disp: , Rfl:  .  clopidogrel (PLAVIX) 75 MG tablet, Take 75 mg by mouth daily with breakfast., Disp: , Rfl:  .  CRANBERRY PO, Take 2 tablets by mouth 2 (two) times daily., Disp: , Rfl:  .  Cyanocobalamin (VITAMIN B-12 PO), Take 1,000 mcg by mouth every morning., Disp: , Rfl:  .  diclofenac sodium (VOLTAREN) 1 % GEL, Apply 2 g topically 3 (three) times daily as needed (for pain). , Disp: , Rfl:  .  fluticasone (FLONASE) 50 MCG/ACT nasal spray, Place 2 sprays into both nostrils daily as needed (allergies or post-nasl drip). , Disp: , Rfl: 3 .  levothyroxine (SYNTHROID, LEVOTHROID) 50 MCG tablet, Take 50 mcg by mouth See admin instructions. Take 50 mcg by mouth in the morning before breakfast on Mon/Tues/Wed, Disp: , Rfl:  .  levothyroxine (SYNTHROID, LEVOTHROID) 75 MCG tablet, Take 75 mcg by mouth in the morning before breakfast on Sun/Thurs/Fri/Sat, Disp: , Rfl: 1 .  losartan (COZAAR) 100 MG tablet, Take 100 mg by mouth every morning. , Disp: , Rfl:  .  Omega-3 Fatty Acids (FISH OIL) 1000 MG CAPS, Take 2,000 mg by mouth 2 (two) times daily. , Disp: , Rfl:  .  rosuvastatin (CRESTOR) 10 MG tablet, Take 10 mg by mouth at bedtime. , Disp: , Rfl:  .  tretinoin (RETIN-A) 0.05 % cream, Apply to the skin nightly as directed, Disp: , Rfl: 3 .  vitamin C (ASCORBIC ACID) 500 MG tablet, Take 500 mg by mouth daily., Disp: , Rfl:  .  vitamin E 1000 UNIT capsule, Take 1,000 Units by mouth at bedtime., Disp: , Rfl:  .  albuterol (PROVENTIL HFA;VENTOLIN HFA) 108 (90 Base) MCG/ACT inhaler, Inhale 2 puffs into the lungs every 6 (six)  hours as needed for wheezing or shortness of breath., Disp: 1 Inhaler, Rfl: 2 .  cyclobenzaprine (FLEXERIL) 10 MG tablet, TAKE 1 TABLET AT BEDTIME AS NEEDED, Disp: 90 tablet, Rfl: 1 .  levETIRAcetam (KEPPRA) 500 MG tablet, Take 1 tablet (500 mg total) by mouth every 12 (twelve) hours., Disp: 180 tablet,  Rfl: 3 .  topiramate (TOPAMAX) 25 MG tablet, TAKE 1 TABLET IN THE MORNING AND 2 TABLETS IN THE EVENING, Disp: 270 tablet, Rfl: 3   Allergies  Allergen Reactions  . Phenytoin Anaphylaxis, Hives and Swelling     Review of Systems  Constitutional: Negative.  Negative for malaise/fatigue.  HENT: Negative.   Eyes: Negative.   Respiratory: Positive for cough. Negative for shortness of breath.   Cardiovascular: Negative.  Negative for chest pain, palpitations and leg swelling.  Gastrointestinal: Negative.   Endocrine: Negative.   Genitourinary: Negative.   Musculoskeletal: Negative.   Skin: Negative.   Allergic/Immunologic: Negative.   Neurological: Negative.  Negative for dizziness and headaches.  Hematological: Negative.   Psychiatric/Behavioral: Negative.      Today's Vitals   09/03/18 1424  Pulse: 84  Temp: (!) 97.3 F (36.3 C)  TempSrc: Oral  SpO2: 96%  Weight: 164 lb 6.4 oz (74.6 kg)  Height: 5' 1.4" (1.56 m)  PainSc: 2   PainLoc: Abdomen   Body mass index is 30.66 kg/m.   Objective:  Physical Exam Vitals signs reviewed.  Constitutional:      Appearance: She is well-developed.  HENT:     Head: Normocephalic and atraumatic.     Right Ear: Hearing, tympanic membrane, ear canal and external ear normal.     Left Ear: Hearing, tympanic membrane, ear canal and external ear normal.     Nose: Nose normal.     Mouth/Throat:     Mouth: Mucous membranes are moist.  Eyes:     General: Lids are normal.     Conjunctiva/sclera: Conjunctivae normal.     Pupils: Pupils are equal, round, and reactive to light.     Funduscopic exam:    Right eye: No papilledema.        Left  eye: No papilledema.  Neck:     Musculoskeletal: Full passive range of motion without pain, normal range of motion and neck supple.     Thyroid: No thyroid mass.     Vascular: No carotid bruit.  Cardiovascular:     Rate and Rhythm: Normal rate and regular rhythm.     Pulses: Normal pulses.     Heart sounds: Normal heart sounds. No murmur.  Pulmonary:     Effort: Pulmonary effort is normal.     Breath sounds: Normal breath sounds.  Abdominal:     General: Abdomen is flat. Bowel sounds are normal.     Palpations: Abdomen is soft.  Musculoskeletal: Normal range of motion.  Skin:    General: Skin is warm and dry.     Capillary Refill: Capillary refill takes less than 2 seconds.  Neurological:     General: No focal deficit present.     Mental Status: She is alert and oriented to person, place, and time.     Cranial Nerves: No cranial nerve deficit.     Sensory: No sensory deficit.  Psychiatric:        Behavior: Behavior normal.        Thought Content: Thought content normal.        Judgment: Judgment normal.       Assessment And Plan:     1. Hypertension, unspecified type . B/P is controlled.  . CMP ordered to check renal function.  . The importance of regular exercise and dietary modification was stressed to the patient.  . Stressed importance of losing ten percent of her body weight to help with B/P control.  . The  weight loss would help with decreasing cardiac and cancer risk as well.  - POCT Urinalysis Dipstick (81002) - POCT UA - Microalbumin - EKG [id number] - Basic metabolic panel - Hepatic function panel  2. Acquired hypothyroidism  Chronic, controlled  Continue with current medications - TSH - T3, free - T4  3. Chronic obstructive pulmonary disease, unspecified COPD type (HCC)  Chronic  No significant episodes or exacerbations - albuterol (PROVENTIL HFA;VENTOLIN HFA) 108 (90 Base) MCG/ACT inhaler; Inhale 2 puffs into the lungs every 6 (six) hours as needed  for wheezing or shortness of breath.  Dispense: 1 Inhaler; Refill: 2  4. Prediabetes  Chronic, controlled  Continue with current medications  Encouraged to limit intake of sugary foods and drinks  Encouraged to increase physical activity to 150 minutes per week as tolerated - Hemoglobin A1c  5. Former heavy tobacco smoker  No longer smokin  6. Encounter for Medicare annual wellness exam  Pt's annual wellness exam was performed and geriatric assessment reviewed.   Pt has no new identiafble wellness concerns at this time.   WIll obtain routine labs.   Will obtain UA and micro normal micro and UA except small leukocytes  Behavior modifications discussed and diet history reviewed. Pt will continue to exercise regularly and modify diet, with low GI, plant based foods and decrease food intake of processed foods.   Recommend intake of daily multivitamin, Vitamin D, and calcium.  Recommend mammogram (up to date) and colonoscopy (up to date) for preventive screenings, as well as recommend immunizations that include influenza (up to date) and TDAP     Minette Brine, FNP    Subjective:   FLORIENE JESCHKE is a 62 y.o. female who presents for Medicare Annual (Subsequent) preventive examination.  Review of Systems:  See above       Objective:     Vitals: Pulse 84   Temp (!) 97.3 F (36.3 C) (Oral)   Ht 5' 1.4" (1.56 m)   Wt 164 lb 6.4 oz (74.6 kg)   SpO2 96%   BMI 30.66 kg/m   Body mass index is 30.66 kg/m.  Advanced Directives 09/03/2018 11/13/2017 08/18/2016 05/28/2015 01/29/2015 05/07/2014 05/07/2014  Does Patient Have a Medical Advance Directive? No No No Yes Yes - Yes  Type of Advance Directive - - - Living will [place];Living will [place];Living will [place];Living will  Does patient want to make changes to medical advance directive? - - - - - - -  Copy of [place] in Chart? - - - No  - copy requested - - -  Would patient like information on creating a medical advance directive? Yes (MAU/Ambulatory/Procedural Areas - Information given) No - Patient declined No - Patient declined - - - -  Pre-existing out of facility DNR order (yellow form or pink MOST form) - - - - - - -    Tobacco Social History   Tobacco Use  Smoking Status Former Smoker  . Last attempt to quit: 09/05/2008  . Years since quitting: 10.0  Smokeless Tobacco Never Used     Counseling given: Not Answered   Clinical Intake:     Pain Score: 2                  Past Medical History:  Diagnosis Date  . Abnormal gait   . Amnesia   . Anemia    hx  . Aneurysm ([place])   . Anxiety   .  Asthma   . Atrophic vaginitis   . Benign hypertensive heart disease   . Cardiomegaly   . Chest pain   . COPD (chronic obstructive pulmonary disease) ([place])     - PFTs  01/24/06 FEV1 69% ratio 68% diffusing capacity 57% with no improvement after B2   - PFT's 10/1/ 09 FEV1 77   ratio  76    dlco                   45            no resp to B2    - Nl alpha one antitripsin level 10/09  . Cough   . Depression   . Difficulty speaking   . Disorder of nasal cavity   . Dyspnea   . Enthesopathy of elbow region   . GERD (gastroesophageal reflux disease)   . Hammer toe   . Headache(784.0)   . Hemiparesis affecting dominant side as late effect of cerebrovascular accident ([place])   . History of kidney stones   . Hyperlipidemia   . Hypertension   . Hypothyroidism   . Increased urinary frequency   . Low compliance bladder   . Malaise and fatigue   . Nonruptured cerebral aneurysm   . Obesity   . Pneumonia    hx  . Pure hypercholesterolemia   . Recurrent major depressive episodes ([place])   . Seizure disorder, secondary ([place])    2-3 focal siezures monthly  . Seizures ([place])   . Short-term memory loss    since stroke  . Skin sensation disturbance   . Stroke [facility]) 2011   rt sided weakness  . Swelling of limb   . TMJ  (temporomandibular joint disorder)   . Urinary tract infectious disease   . Vertigo as late effect of stroke   . Vitamin D deficiency    Past Surgical History:  Procedure Laterality Date  . BACK SURGERY    . CEREBRAL ANGIOGRAM     x3  . COLONOSCOPY WITH PROPOFOL N/A 08/18/2016   Procedure: COLONOSCOPY WITH PROPOFOL;  Surgeon: Carol Ada, MD;  Location: [facility];  Service: Endoscopy;  Laterality: N/A;  . LAPAROSCOPIC HYSTERECTOMY    . MANDIBLE FRACTURE SURGERY    . RADIOLOGY WITH ANESTHESIA N/A 08/21/2013   Procedure: RADIOLOGY WITH ANESTHESIA;  Surgeon: Rob Hickman, MD;  Location: [facility];  Service: Radiology;  Laterality: N/A;  . stones     no surgery   Family History  Problem Relation Age of Onset  . Heart disease Mother   . Heart disease Father   . Heart attack Father   . Cancer Father        prostate to lymph node  . Lung cancer Other   . Breast cancer Neg Hx    Social History   Socioeconomic History  . Marital status: Married    Spouse name: Jori Moll  . Number of children: 2  . Years of education: 58  . Highest education level: Not on file  Occupational History  . 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 Financial resource strain: Not on file  . Food insecurity:    Worry: Not on file    Inability: Not on file  . Transportation needs:    Medical: Not on file    Non-medical: Not on file  Tobacco Use  . Smoking status: Former Smoker    Last attempt to quit: 09/05/2008    Years  since quitting: 10.0  . Smokeless tobacco: Never Used  Substance and Sexual Activity  . Alcohol use: No    Alcohol/week: 0.0 standard drinks    Comment: patient quit alcohol use 2005.   . Drug use: No  . Sexual activity: Not on file  Lifestyle  . Physical activity:    Days per week: Not on file    Minutes per session: Not on file  . Stress: Not on file  Relationships  . Social connections:    Talks on phone: Not on file    Gets  together: Not on file    Attends religious service: Not on file    Active member of club or organization: Not on file    Attends meetings of clubs or organizations: Not on file    Relationship status: Not on file  Other Topics Concern  . Not on file  Social History Narrative   Patient lives at home with her husband Jori Moll).   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    Patient is on disability.    Patient has an Associates degree.   Patient is right-handed.   Patient drinks 2 cups of green tea most days.                Outpatient Encounter Medications as of 09/03/2018  Medication Sig  . ALPRAZolam (XANAX) 0.25 MG tablet Take 1 tablet (0.25 mg total) by mouth 2 (two) times daily.  Marland Kitchen aspirin EC 81 MG tablet Take 81 mg by mouth daily.  . B Complex-C (SUPER B COMPLEX PO) Take 1 tablet by mouth daily.  . Cholecalciferol (VITAMIN D-3) 1000 units CAPS Take 1,000 Units by mouth daily.  . citalopram (CELEXA) 40 MG tablet Take 40 mg by mouth at bedtime.  . clopidogrel (PLAVIX) 75 MG tablet Take 75 mg by mouth daily with breakfast.  . CRANBERRY PO Take 2 tablets by mouth 2 (two) times daily.  . Cyanocobalamin (VITAMIN B-12 PO) Take 1,000 mcg by mouth every morning.  . diclofenac sodium (VOLTAREN) 1 % GEL Apply 2 g topically 3 (three) times daily as needed (for pain).   . fluticasone (FLONASE) 50 MCG/ACT nasal spray Place 2 sprays into both nostrils daily as needed (allergies or post-nasl drip).   Marland Kitchen levothyroxine (SYNTHROID, LEVOTHROID) 50 MCG tablet Take 50 mcg by mouth See admin instructions. Take 50 mcg by mouth in the morning before breakfast on Mon/Tues/Wed  . levothyroxine (SYNTHROID, LEVOTHROID) 75 MCG tablet Take 75 mcg by mouth in the morning before breakfast on Sun/Thurs/Fri/Sat  . losartan (COZAAR) 100 MG tablet Take 100 mg by mouth every morning.   . Omega-3 Fatty Acids (FISH OIL) 1000 MG CAPS Take 2,000 mg by mouth 2 (two) times daily.     . rosuvastatin (CRESTOR) 10 MG tablet Take 10 mg by mouth at bedtime.   . tretinoin (RETIN-A) 0.05 % cream Apply to the skin nightly as directed  . vitamin C (ASCORBIC ACID) 500 MG tablet Take 500 mg by mouth daily.  . vitamin E 1000 UNIT capsule Take 1,000 Units by mouth at bedtime.  . [DISCONTINUED] cyclobenzaprine (FLEXERIL) 10 MG tablet Take 10 mg by mouth at bedtime.   . [DISCONTINUED] levETIRAcetam (KEPPRA) 500 MG tablet Take 1 tablet (500 mg total) by mouth every 12 (twelve) hours.  . [DISCONTINUED] topiramate (TOPAMAX) 25 MG tablet TAKE 1 TABLET IN THE MORNING AND 2 TABLETS IN THE EVENING  . albuterol (  PROVENTIL HFA;VENTOLIN HFA) 108 (90 Base) MCG/ACT inhaler Inhale 2 puffs into the lungs every 6 (six) hours as needed for wheezing or shortness of breath.  . [DISCONTINUED] Multiple Vitamins-Minerals (OCUVITE PRESERVISION PO) Take 1 tablet by mouth daily.   No facility-administered encounter medications on file as of 09/03/2018.     Activities of Daily Living In your present state of health, do you have any difficulty performing the following activities: 09/03/2018  Hearing? N  Vision? N  Difficulty concentrating or making decisions? Y  Walking or climbing stairs? Y  Dressing or bathing? Y  Doing errands, shopping? Y  Some recent data might be hidden    Patient Care Team: Glendale Chard, MD as PCP - General (Internal Medicine)    Assessment:   This is a routine wellness examination for [place].  Exercise Activities and Dietary recommendations    Goals    . Exercise 150 min/wk Moderate Activity     "I would like to exercise more and plan on it when we start back at the church"       Fall Risk Fall Risk  09/18/2018 09/03/2018 09/18/2017 07/11/2016 07/11/2016  Falls in the past year? 1 1 Yes Yes Yes  Number falls in past yr: 1 1 2  or more 1 2 or more  Injury with Fall? 1 1 No Yes Yes  Comment hit head - - - -  Risk Factor Category  - - - - High Fall Risk  Risk for fall  due to : Impaired balance/gait - - - Impaired balance/gait  Follow up - - - Education provided;Falls prevention discussed Falls prevention discussed   Is the patient's home free of loose throw rugs in walkways, pet beds, electrical cords, etc?   No, has the nonslid rugs in bathroom floor      Grab bars in the bathroom? no      Handrails on the stairs?   yes      Adequate lighting?   yes  Timed Get Up and Go performed: 2-3 seconds  Depression Screen PHQ 2/9 Scores 09/03/2018  PHQ - 2 Score 0     Cognitive Function MMSE - Mini Mental State Exam 09/03/2018 11/03/2015 05/05/2015 11/05/2014  Orientation to time 4 4 5 4   Orientation to Place 5 4 4 5   Registration 3 3 3 3   Attention/ Calculation 5 5 4 5   Recall 3 2 1 2   Language- name 2 objects 2 2 2 2   Language- repeat 1 1 1 1   Language- follow 3 step command 3 3 3 3   Language- read & follow direction 1 1 1 1   Write a sentence 1 1 1 1   Copy design 1 1 1 1   Total score 29 27 26 28         Immunization History  Administered Date(s) Administered  . Influenza-Unspecified 06/20/2018    Qualifies for Shingles Vaccine? Yes, she feels she had at [facility].    Screening Tests Health Maintenance  Topic Date Due  . HIV Screening  09/04/2019 (Originally 11/08/1970)  . MAMMOGRAM  05/01/2020  . PAP SMEAR-Modifier  08/31/2020  . TETANUS/TDAP  03/06/2022  . COLONOSCOPY  08/18/2026  . INFLUENZA VACCINE  Completed  . Hepatitis C Screening  Completed    Cancer Screenings: Lung: Low Dose CT Chest recommended if Age 78-80 years, 30 pack-year currently smoking OR have quit w/in 15years. Patient does qualify. Breast:  Up to date on Mammogram? Yes   Up to date of Bone Density/Dexa? Yes Colorectal:  up to date per patient  Additional Screenings:  Hepatitis C Screening:      Plan:   See above  I have personally reviewed and noted the following in the patient's chart:   . Medical and social history . Use of alcohol, tobacco or illicit  drugs  . Current medications and supplements . Functional ability and status . Nutritional status . Physical activity . Advanced directives . List of other physicians . Hospitalizations, surgeries, and ER visits in previous 12 months . Vitals . Screenings to include cognitive, depression, and falls . Referrals and appointments  In addition, I have reviewed and discussed with patient certain preventive protocols, quality metrics, and best practice recommendations. A written personalized care plan for preventive services as well as general preventive health recommendations were provided to patient.     Minette Brine, FNP  09/25/2018

## 2018-09-04 LAB — HEPATIC FUNCTION PANEL
ALT: 17 IU/L (ref 0–32)
AST: 22 IU/L (ref 0–40)
Albumin: 4.4 g/dL (ref 3.6–4.8)
Alkaline Phosphatase: 101 IU/L (ref 39–117)
Bilirubin Total: 0.5 mg/dL (ref 0.0–1.2)
Bilirubin, Direct: 0.14 mg/dL (ref 0.00–0.40)
Total Protein: 6.5 g/dL (ref 6.0–8.5)

## 2018-09-04 LAB — BASIC METABOLIC PANEL
BUN/Creatinine Ratio: 16 (ref 12–28)
BUN: 11 mg/dL (ref 8–27)
CO2: 23 mmol/L (ref 20–29)
Calcium: 9.7 mg/dL (ref 8.7–10.3)
Chloride: 105 mmol/L (ref 96–106)
Creatinine, Ser: 0.68 mg/dL (ref 0.57–1.00)
GFR calc Af Amer: 109 mL/min/{1.73_m2} (ref >59)
GFR calc non Af Amer: 95 mL/min/{1.73_m2} (ref >59)
Glucose: 83 mg/dL (ref 65–99)
Potassium: 4.3 mmol/L (ref 3.5–5.2)
Sodium: 143 mmol/L (ref 134–144)

## 2018-09-04 LAB — HEMOGLOBIN A1C
Est. average glucose Bld gHb Est-mCnc: 120 mg/dL
Hgb A1c MFr Bld: 5.8 % — ABNORMAL HIGH (ref 4.8–5.6)

## 2018-09-04 LAB — TSH: TSH: 0.867 u[IU]/mL (ref 0.450–4.500)

## 2018-09-04 LAB — T4: T4, Total: 7 ug/dL (ref 4.5–12.0)

## 2018-09-04 LAB — T3, FREE: T3, Free: 2.7 pg/mL (ref 2.0–4.4)

## 2018-09-11 ENCOUNTER — Other Ambulatory Visit (HOSPITAL_COMMUNITY): Payer: Self-pay | Admitting: Interventional Radiology

## 2018-09-11 ENCOUNTER — Telehealth (HOSPITAL_COMMUNITY): Payer: Self-pay

## 2018-09-11 DIAGNOSIS — I729 Aneurysm of unspecified site: Secondary | ICD-10-CM

## 2018-09-11 NOTE — Telephone Encounter (Signed)
Called to schedule mri/mra, no answer, left vm. AW  

## 2018-09-14 ENCOUNTER — Other Ambulatory Visit: Payer: Self-pay | Admitting: Internal Medicine

## 2018-09-15 NOTE — Telephone Encounter (Signed)
cyclobenzaprine refill 

## 2018-09-17 NOTE — Progress Notes (Signed)
[facility]  PATIENT: [name] DOB: 02-Mar-1956   REASON FOR VISIT: Follow-up for nontraumatic intracerebral hemorrhage, abnormality of gait, right foot drop, complex partial seizure disorder, and vascular headaches HISTORY FROM: Patient and Ron husband  HISTORY OF PRESENT ILLNESS:2/28/17Ms [name], 63 year old female returns for followup. She was last seen in our office 05/05/15. 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 She states her headaches are doing well on Topamax 75 mg daily however she recently went to the eye doctor and was told she probably needed to discontinue the Topamax. I see nothing in the record. Her tremors were doing well,not bothersome She remains on Keppra She has not had any further seizures. She does complain of occasional fluttering of both her eyes but this is transient and is not similar to her prior seizures. She continues to have right-sided weakness and gait difficulty and incoordination is unchanged. She walks with a single-point cane. She is no longer using her TENS unit as the battery needs replacing. She is also on aspirin and Plavix and CT angio is followed by Dr. Britt Bottom now yearly . Her mild cognitive stable. She has had one fall in the last year. She returns for routine followup.   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  She returns for followup after last visit on 09/20/12. She continues to have headaches several times a week , she does not describe them as  disabling.. She remains on Topamax 25mg  tab, total dose of 75mg  daily, is tolerating well without side effects but complains of mild memory loss. She underwent cerebral catheter angiogram on 10//13 by Dr.Deveshwar which showed stable appearance of pericallosal and left P2/P3 segment aneurysms. She states some mild tremor in the hand continues and it is not disabling.She complains of persistent mild right-sided weakness as well as some temperature sensitivity and sensation of coldness of the right foot ever since her stroke in 2011.She does feel her AFO tens unit is effective. She has fallen recently.   Update 11/6/2017PS :  She returns for follow-up after last visit 8 months ago. She is accompanied by husband. Patient states she is doing well except that she had a fall a month ago. She bruised her right hand which is yet swollen and painful. She was seen in urgent care and x-rays did not show any fracture but her symptoms have persisted. She has not yet seen her primary physician for this. She states her migraines are doing well and she has only occasional 1 or 2 headaches a month which are not bad. She remains on Topamax 25 g twice daily which is tolerating well. She continues to have poor short-term memory difficulties which are about unchanged. She states her blood pressure is well controlled. She is tolerating Lipitor well without muscle aches or pain. She remains on Plavix which she does bruise easily but has not had any major bleeding problems. She is walking with a cane. She has no new complaints. UPDATE 05/10/2018[name], 63 year old female returns for follow-up She has hx of left frontopareital parenchymal hemorrhage in March 2011 from PRES and malignant hypertension.she has a history of  chronic headaches. She states her headaches are doing well on Topamax 75 mg daily.. Her tremors were doing well,not bothersome She remains on Keppra 500 mg twice daily .She has not had any further  seizures. She does complain of occasional fluttering of both her eyes but this is transient and is not similar to her prior seizures. She continues to have right-sided weakness and gait difficulty and incoordination is unchanged. She walks with a single-point cane. She is no longer using her TENS unit. She is also on aspirin and Plavix and CT angio is followed by Dr. Britt Bottom now yearly . Blood pressure in the office today 123/71 Her mild cognitive impairment stable. She has had 2 falls since last seen her  right ankle is in a boot . She returns for routine followup.  Update 09/18/2017 PS: She returns for follow-up after last visit 7 months ago. She is accompanied by husband. She continues to do well and has not had any recurrent strokes or seizures since March 2011. She states her blood pressure is well controlled and today it is 119/79. She remains on Plavix which is tolerating well without bruising or bleeding. She states her lipids have been controlled. She has not had any seizures since 2011 but she is reluctant to consider reducing Keppra which she seems to be tolerating well. She takes a migraine headaches are well controlled. She gets rare 1 significant headache a month does have some mild dull aching pains 2-3 times a week. She takes Topamax 25 mg twice daily.  He continues to have mild right spastic hemiparesis with foot drop. She does use a cane for long distances. She is careful with her walking and has not had any falls or injuries. UPDATE 1/14/2020[name] returns for yearly follow-up.  She has not had recurrent strokes or seizure activity since March 2011 blood pressure well controlled in the office today at 126/85.  She is currently on Plavix and aspirin with minimal bruising and no bleeding.  She remains on Crestor and lipids are followed by primary care.  According to the patient it was okay at her last check.  She remains on Keppra for seizure disorder without side effects.  She  is on Topamax for migraine prevention.  She ambulates with a single-point cane she has a foot drop and wears AFO on the right.  She also has a spastic hemiparesis on the right.  She continues to follow with Dr. Estanislado Pandy every year.  She returns for reevaluation  REVIEW OF SYSTEMS: Full 14 system review of systems performed and notable only for those listed, all others are neg:  Constitutional: neg Cardiovascular: Leg swelling Ear/Nose/Throat: neg  Skin: neg Eyes: neg Respiratory: cough Gastroitestinal: neg  Hematology/Lymphatic: Easy bruising Endocrine: neg Musculoskeletal: Walking difficulty Allergy/Immunology: neg Neurological: Weakness, tremors, headache Psychiatric: Depression and anxiety Sleep : neg   ALLERGIES: Allergies  Allergen Reactions  . Phenytoin Anaphylaxis, Hives and Swelling    HOME MEDICATIONS: Outpatient Medications Prior to Visit  Medication Sig Dispense Refill  . albuterol (PROVENTIL HFA;VENTOLIN HFA) 108 (90 Base) MCG/ACT inhaler Inhale 2 puffs into the lungs every 6 (six) hours as needed for wheezing or shortness of breath. 1 Inhaler 2  . ALPRAZolam (XANAX) 0.25 MG tablet Take 1 tablet (0.25 mg total) by mouth 2 (two) times daily. 90 tablet 2  . aspirin EC 81 MG tablet Take 81 mg by mouth daily.    . B Complex-C (SUPER B COMPLEX PO) Take 1 tablet by mouth  daily.    . Cholecalciferol (VITAMIN D-3) 1000 units CAPS Take 1,000 Units by mouth daily.    . citalopram (CELEXA) 40 MG tablet Take 40 mg by mouth at bedtime.    . clopidogrel (PLAVIX) 75 MG tablet Take 75 mg by mouth daily with breakfast.    . CRANBERRY PO Take 2 tablets by mouth 2 (two) times daily.    . Cyanocobalamin (VITAMIN B-12 PO) Take 1,000 mcg by mouth every morning.    . cyclobenzaprine (FLEXERIL) 10 MG tablet TAKE 1 TABLET AT BEDTIME AS NEEDED 90 tablet 1  . diclofenac sodium (VOLTAREN) 1 % GEL Apply 2 g topically 3 (three) times daily as needed (for pain).     . fluticasone (FLONASE) 50  MCG/ACT nasal spray Place 2 sprays into both nostrils daily as needed (allergies or post-nasl drip).   3  . levETIRAcetam (KEPPRA) 500 MG tablet Take 1 tablet (500 mg total) by mouth every 12 (twelve) hours. 180 tablet 4  . levothyroxine (SYNTHROID, LEVOTHROID) 50 MCG tablet Take 50 mcg by mouth See admin instructions. Take 50 mcg by mouth in the morning before breakfast on Mon/Tues/Wed    . levothyroxine (SYNTHROID, LEVOTHROID) 75 MCG tablet Take 75 mcg by mouth in the morning before breakfast on Sun/Thurs/Fri/Sat  1  . losartan (COZAAR) 100 MG tablet Take 100 mg by mouth every morning.     . Omega-3 Fatty Acids (FISH OIL) 1000 MG CAPS Take 2,000 mg by mouth 2 (two) times daily.     . rosuvastatin (CRESTOR) 10 MG tablet Take 10 mg by mouth at bedtime.     . topiramate (TOPAMAX) 25 MG tablet TAKE 1 TABLET IN THE MORNING AND 2 TABLETS IN THE EVENING 270 tablet 1  . tretinoin (RETIN-A) 0.05 % cream Apply to the skin nightly as directed  3  . vitamin C (ASCORBIC ACID) 500 MG tablet Take 500 mg by mouth daily.    . vitamin E 1000 UNIT capsule Take 1,000 Units by mouth at bedtime.     No facility-administered medications prior to visit.     PAST MEDICAL HISTORY: Past Medical History:  Diagnosis Date  . Abnormal gait   . Amnesia   . Anemia    hx  . Aneurysm ([place])   . Anxiety   . Asthma   . Atrophic vaginitis   . Benign hypertensive heart disease   . Cardiomegaly   . Chest pain   . COPD (chronic obstructive pulmonary disease) ([place])     - PFTs  01/24/06 FEV1 69% ratio 68% diffusing capacity 57% with no improvement after B2   - PFT's 10/1/ 09 FEV1 77   ratio  76    dlco                   45            no resp to B2    - Nl alpha one antitripsin level 10/09  . Cough   . Depression   . Difficulty speaking   . Disorder of nasal cavity   . Dyspnea   . Enthesopathy of elbow region   . GERD (gastroesophageal reflux disease)   . Hammer toe   . Headache(784.0)   . Hemiparesis affecting dominant  side as late effect of cerebrovascular accident ([place])   . History of kidney stones   . Hyperlipidemia   . Hypertension   . Hypothyroidism   . Increased urinary frequency   . Low compliance bladder   .  Malaise and fatigue   . Nonruptured cerebral aneurysm   . Obesity   . Pneumonia    hx  . Pure hypercholesterolemia   . Recurrent major depressive episodes ([place])   . Seizure disorder, secondary ([place])    2-3 focal siezures monthly  . Seizures ([place])   . Short-term memory loss    since stroke  . Skin sensation disturbance   . Stroke [facility]) 2011   rt sided weakness  . Swelling of limb   . TMJ (temporomandibular joint disorder)   . Urinary tract infectious disease   . Vertigo as late effect of stroke   . Vitamin D deficiency     PAST SURGICAL HISTORY: Past Surgical History:  Procedure Laterality Date  . BACK SURGERY    . CEREBRAL ANGIOGRAM     x3  . COLONOSCOPY WITH PROPOFOL N/A 08/18/2016   Procedure: COLONOSCOPY WITH PROPOFOL;  Surgeon: Carol Ada, MD;  Location: [facility];  Service: Endoscopy;  Laterality: N/A;  . LAPAROSCOPIC HYSTERECTOMY    . MANDIBLE FRACTURE SURGERY    . RADIOLOGY WITH ANESTHESIA N/A 08/21/2013   Procedure: RADIOLOGY WITH ANESTHESIA;  Surgeon: Rob Hickman, MD;  Location: [facility];  Service: Radiology;  Laterality: N/A;  . stones     no surgery    FAMILY HISTORY: Family History  Problem Relation Age of Onset  . Heart disease Mother   . Heart disease Father   . Heart attack Father   . Cancer Father        prostate to lymph node  . Lung cancer Other   . Breast cancer Neg Hx     SOCIAL HISTORY: Social History   Socioeconomic History  . Marital status: Married    Spouse name: Jori Moll  . Number of children: 2  . Years of education: 25  . Highest education level: Not on file  Occupational History  . 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 Financial resource strain: Not on file  . Food  insecurity:    Worry: Not on file    Inability: Not on file  . Transportation needs:    Medical: Not on file    Non-medical: Not on file  Tobacco Use  . Smoking status: Former Smoker    Last attempt to quit: 09/05/2008    Years since quitting: 10.0  . Smokeless tobacco: Never Used  Substance and Sexual Activity  . Alcohol use: No    Alcohol/week: 0.0 standard drinks    Comment: patient quit alcohol use 2005.   . Drug use: No  . Sexual activity: Not on file  Lifestyle  . Physical activity:    Days per week: Not on file    Minutes per session: Not on file  . Stress: Not on file  Relationships  . Social connections:    Talks on phone: Not on file    Gets together: Not on file    Attends religious service: Not on file    Active member of club or organization: Not on file    Attends meetings of clubs or organizations: Not on file    Relationship status: Not on file  . Intimate partner violence:    Fear of current or ex partner: Not on file    Emotionally abused: Not on file    Physically abused: Not on file    Forced sexual activity: Not on file  Other Topics Concern  . Not on file  Social History  Narrative   Patient lives at home with her husband Jori Moll).   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    Patient is on disability.    Patient has an Associates degree.   Patient is right-handed.   Patient drinks 2 cups of green tea most days.                 PHYSICAL EXAM  Vitals:   09/18/18 0950  BP: 126/85  Pulse: 97  Weight: 165 lb 3.2 oz (74.9 kg)  Height: 5' 2.5" (1.588 m)   Body mass index is 29.73 kg/m.  Generalized: Well developed, in no acute distress  Head: normocephalic and atraumatic,. Oropharynx benign  Neck: Supple, no carotid bruits  Cardiac: Regular rate rhythm, no murmur  Skin no rash few bruises  Neurological examination   Mentation: Alert oriented to time, place, history taking. Follows all commands  speech and language fluent     Cranial nerve II-XII: Pupils were equal round reactive to light extraocular movements were full, visual field were full on confrontational test. Facial sensation and strength were normal. hearing was intact to finger rubbing bilaterally. Uvula tongue midline. head turning and shoulder shrug were normal and symmetric.Tongue protrusion into cheek strength was normal. Motor: Mild 4/5 right-sided hemiparesis with distal more than proximal weakness increased tone on the right upper and lower extremity. Right foot drop. AFO  Right lower leg   Sensory: Touch and pinprick are normal on the left mildly decreased on the right, vibratory is decreased on the right lower extremity  Coordination: Impaired on the right normal on the left  Reflexes: Brachioradialis 2/2, biceps 2/2, triceps 2/2, patellar 2/2, Achilles 1/1, plantar responses were flexor bilaterally. Gait and Station: Rising up from seated position without assistance, wide based gait right foot drop using  single-point cane unsteady gait , unable to tandem  DIAGNOSTIC DATA (LABS, IMAGING, TESTING) - I reviewed patient records, labs, notes, testing and imaging myself where available.  Lab Results  Component Value Date   WBC 5.3 05/28/2015   HGB 11.8 (L) 05/28/2015   HCT 37.2 05/28/2015   MCV 94.7 05/28/2015   PLT 182 05/28/2015      Component Value Date/Time   NA 143 09/03/2018 1540   K 4.3 09/03/2018 1540   CL 105 09/03/2018 1540   CO2 23 09/03/2018 1540   GLUCOSE 83 09/03/2018 1540   GLUCOSE 94 05/28/2015 1302   BUN 11 09/03/2018 1540   CREATININE 0.68 09/03/2018 1540   CALCIUM 9.7 09/03/2018 1540   PROT 6.5 09/03/2018 1540   ALBUMIN 4.4 09/03/2018 1540   AST 22 09/03/2018 1540   ALT 17 09/03/2018 1540   ALKPHOS 101 09/03/2018 1540   BILITOT 0.5 09/03/2018 1540   GFRNONAA 95 09/03/2018 1540   GFRAA 109 09/03/2018 1540         ASSESSMENT AND PLAN 63y.o. year old female has a past medical  history of left frontoparietal parenchymal hemorrhage March 2011 from PRES and malignant hypertension. Chronic headaches with history of migraines  And tension headaches improved.Placement of pipeline stent across Left PICA aneurysm by Dr. Estanislado Pandy on 08/21/2013. History of seizure disorder .   Continue Plavix for stroke prevention  Control of hypertension with blood pressure goal below 130/90 Today's reading 126/85   lipids with LDL cholesterol goal below 70 mg percent. Continue Crestor, Labs Followed by primary care Continue Keppra 500 mg twice daily, no seizure activity Continue  Topamax 75mg  daily for migraine prevention  I also counseled the patient to use a cane or walker at all times  walking and fall and safety precautions.  Get back to regular exercise routine  Continue yearly follow-up with Dr. Estanislado Pandy, has appt today Follow up yearly I spent 25 min  in total face to face time with the patient more than 50% of which was spent counseling and coordination of care, reviewing test results reviewing medications and discussing and reviewing the diagnosis of stroke and management of risk factors, migraine prevention, and seizure disorder , gait disorderand further treatment options. , Rayburn Ma, [place], APRN  [facility] [address], [place], [place] [address] [phone]

## 2018-09-18 ENCOUNTER — Ambulatory Visit (INDEPENDENT_AMBULATORY_CARE_PROVIDER_SITE_OTHER): Payer: Medicare Other | Admitting: Nurse Practitioner

## 2018-09-18 ENCOUNTER — Ambulatory Visit (HOSPITAL_COMMUNITY)
Admission: RE | Admit: 2018-09-18 | Discharge: 2018-09-18 | Disposition: A | Discharge status: Home / Self Care | Payer: Medicare Other | Source: Ambulatory Visit | Attending: Interventional Radiology | Admitting: Interventional Radiology

## 2018-09-18 ENCOUNTER — Encounter: Payer: Self-pay | Admitting: Nurse Practitioner

## 2018-09-18 VITALS — BP 126/85 | HR 97 | Ht 62.5 in | Wt 165.2 lb

## 2018-09-18 DIAGNOSIS — G43009 Migraine without aura, not intractable, without status migrainosus: Secondary | ICD-10-CM | POA: Diagnosis not present

## 2018-09-18 DIAGNOSIS — I671 Cerebral aneurysm, nonruptured: Secondary | ICD-10-CM | POA: Insufficient documentation

## 2018-09-18 DIAGNOSIS — I619 Nontraumatic intracerebral hemorrhage, unspecified: Secondary | ICD-10-CM

## 2018-09-18 DIAGNOSIS — G9389 Other specified disorders of brain: Secondary | ICD-10-CM | POA: Diagnosis not present

## 2018-09-18 DIAGNOSIS — R269 Unspecified abnormalities of gait and mobility: Secondary | ICD-10-CM

## 2018-09-18 DIAGNOSIS — G40909 Epilepsy, unspecified, not intractable, without status epilepticus: Secondary | ICD-10-CM

## 2018-09-18 DIAGNOSIS — I729 Aneurysm of unspecified site: Secondary | ICD-10-CM

## 2018-09-18 DIAGNOSIS — M21371 Foot drop, right foot: Secondary | ICD-10-CM | POA: Diagnosis not present

## 2018-09-18 LAB — CREATININE, SERUM
Creatinine, Ser: 1.04 mg/dL — ABNORMAL HIGH (ref 0.44–1.00)
GFR calc Af Amer: >60 mL/min (ref >60)
GFR calc non Af Amer: 58 mL/min — ABNORMAL LOW (ref >60)

## 2018-09-18 MED ORDER — LEVETIRACETAM 500 MG PO TABS: 500.0000 mg | ORAL_TABLET | Freq: Two times a day (BID) | ORAL | 3 refills | Status: DC

## 2018-09-18 MED ORDER — GADOBUTROL 1 MMOL/ML IV SOLN
7.0000 mL | Freq: Once | INTRAVENOUS | Status: AC | PRN
  Administered 2018-09-18: 7 mL via INTRAVENOUS

## 2018-09-18 MED ORDER — TOPIRAMATE 25 MG PO TABS: ORAL_TABLET | ORAL | 3 refills | Status: DC

## 2018-09-18 NOTE — Patient Instructions (Signed)
Continue Plavix for stroke prevention  Control of hypertension with blood pressure goal below 130/90 Today's reading 126/85   lipids with LDL cholesterol goal below 70 mg percent. Continue Crestor, Labs Followed by primary care Continue Keppra 500 mg twice daily, no seizure activity Continue Topamax 75mg  daily for migraine prevention  I also counseled the patient to use a cane or walker at all times  walking and fall and safety precautions.  Get back to regular exercise routine  Follow up yearly

## 2018-09-20 NOTE — Progress Notes (Signed)
I agree with the above plan 

## 2018-09-21 DIAGNOSIS — H353132 Nonexudative age-related macular degeneration, bilateral, intermediate dry stage: Secondary | ICD-10-CM | POA: Diagnosis not present

## 2018-09-24 ENCOUNTER — Telehealth (HOSPITAL_COMMUNITY): Payer: Self-pay

## 2018-09-25 ENCOUNTER — Telehealth (HOSPITAL_COMMUNITY): Payer: Self-pay

## 2018-09-25 NOTE — Telephone Encounter (Signed)
Pt agreed to f/u in 1 year with mri/mra w/o. MRA to include top of anterior cerebral artery per Dr. Estanislado Pandy. AW

## 2018-09-25 NOTE — Telephone Encounter (Signed)
Called pt regarding recent mri, no answer, left vm. AW 

## 2018-10-01 ENCOUNTER — Other Ambulatory Visit: Payer: Self-pay

## 2018-10-01 MED ORDER — CITALOPRAM HYDROBROMIDE 40 MG PO TABS: 40.0000 mg | ORAL_TABLET | Freq: Every day | ORAL | 1 refills | Status: DC

## 2018-10-21 DIAGNOSIS — H353132 Nonexudative age-related macular degeneration, bilateral, intermediate dry stage: Secondary | ICD-10-CM | POA: Diagnosis not present

## 2018-10-22 ENCOUNTER — Telehealth: Payer: Self-pay

## 2018-10-22 NOTE — Telephone Encounter (Signed)
Left the pt a message that I returned her call to schedule her an appointment.

## 2018-10-26 ENCOUNTER — Other Ambulatory Visit: Payer: Self-pay | Admitting: Internal Medicine

## 2018-11-06 ENCOUNTER — Ambulatory Visit
Admission: RE | Admit: 2018-11-06 | Discharge: 2018-11-06 | Disposition: A | Discharge status: Home / Self Care | Payer: Medicare Other | Source: Ambulatory Visit | Attending: Gastroenterology | Admitting: Gastroenterology

## 2018-11-06 ENCOUNTER — Other Ambulatory Visit: Payer: Self-pay | Admitting: Gastroenterology

## 2018-11-06 DIAGNOSIS — R1032 Left lower quadrant pain: Secondary | ICD-10-CM | POA: Diagnosis not present

## 2018-11-06 DIAGNOSIS — K5730 Diverticulosis of large intestine without perforation or abscess without bleeding: Secondary | ICD-10-CM | POA: Diagnosis not present

## 2018-11-06 DIAGNOSIS — K625 Hemorrhage of anus and rectum: Secondary | ICD-10-CM | POA: Diagnosis not present

## 2018-11-06 DIAGNOSIS — K449 Diaphragmatic hernia without obstruction or gangrene: Secondary | ICD-10-CM | POA: Diagnosis not present

## 2018-11-06 MED ORDER — IOPAMIDOL (ISOVUE-300) INJECTION 61%
100.0000 mL | Freq: Once | INTRAVENOUS | Status: AC | PRN
  Administered 2018-11-06: 100 mL via INTRAVENOUS

## 2018-11-07 ENCOUNTER — Encounter: Payer: Self-pay | Admitting: Internal Medicine

## 2018-11-07 DIAGNOSIS — I700 Atherosclerosis of aorta: Secondary | ICD-10-CM | POA: Insufficient documentation

## 2018-11-09 ENCOUNTER — Other Ambulatory Visit: Payer: Self-pay | Admitting: Internal Medicine

## 2018-11-15 ENCOUNTER — Other Ambulatory Visit: Payer: Self-pay

## 2018-11-15 MED ORDER — LEVOTHYROXINE SODIUM 50 MCG PO TABS: ORAL_TABLET | ORAL | 2 refills | Status: DC

## 2018-11-19 ENCOUNTER — Telehealth: Payer: Self-pay

## 2018-11-19 NOTE — Telephone Encounter (Signed)
Patient notified to go to urgent care to be evaluated for a UTI because the office doesn't have any openings tomorrow or she can check back in the morning for a cancellation.

## 2018-11-20 DIAGNOSIS — H353132 Nonexudative age-related macular degeneration, bilateral, intermediate dry stage: Secondary | ICD-10-CM | POA: Diagnosis not present

## 2018-11-20 DIAGNOSIS — N3000 Acute cystitis without hematuria: Secondary | ICD-10-CM | POA: Diagnosis not present

## 2018-11-20 DIAGNOSIS — N3090 Cystitis, unspecified without hematuria: Secondary | ICD-10-CM | POA: Diagnosis not present

## 2018-12-05 ENCOUNTER — Ambulatory Visit: Payer: Medicare Other | Admitting: Nurse Practitioner

## 2018-12-27 ENCOUNTER — Telehealth: Payer: Self-pay

## 2018-12-27 NOTE — Telephone Encounter (Signed)
LVM for pt to call the office to reschedule missed appt 12/27/18

## 2018-12-31 ENCOUNTER — Other Ambulatory Visit: Payer: Self-pay

## 2018-12-31 MED ORDER — LEVOTHYROXINE SODIUM 75 MCG PO TABS: ORAL_TABLET | ORAL | 1 refills | Status: DC

## 2019-01-01 ENCOUNTER — Other Ambulatory Visit: Payer: Self-pay

## 2019-01-01 ENCOUNTER — Encounter: Payer: Self-pay | Admitting: Nurse Practitioner

## 2019-01-01 ENCOUNTER — Ambulatory Visit (INDEPENDENT_AMBULATORY_CARE_PROVIDER_SITE_OTHER): Payer: Medicare Other | Admitting: Nurse Practitioner

## 2019-01-01 VITALS — Temp 98.4°F

## 2019-01-01 DIAGNOSIS — R7303 Prediabetes: Secondary | ICD-10-CM

## 2019-01-01 DIAGNOSIS — J449 Chronic obstructive pulmonary disease, unspecified: Secondary | ICD-10-CM | POA: Diagnosis not present

## 2019-01-01 DIAGNOSIS — E039 Hypothyroidism, unspecified: Secondary | ICD-10-CM

## 2019-01-01 DIAGNOSIS — I119 Hypertensive heart disease without heart failure: Secondary | ICD-10-CM | POA: Insufficient documentation

## 2019-01-01 DIAGNOSIS — I10 Essential (primary) hypertension: Secondary | ICD-10-CM | POA: Diagnosis not present

## 2019-01-01 MED ORDER — LEVOTHYROXINE SODIUM 75 MCG PO TABS: ORAL_TABLET | ORAL | 1 refills | Status: DC

## 2019-01-01 MED ORDER — LEVOTHYROXINE SODIUM 50 MCG PO TABS: ORAL_TABLET | ORAL | 1 refills | Status: DC

## 2019-01-01 NOTE — Progress Notes (Signed)
Virtual Visit via Video   This visit type was conducted due to national recommendations for restrictions regarding the COVID-19 Pandemic (e.g. social distancing) in an effort to limit this patient's exposure and mitigate transmission in our community.  Patients identity confirmed using two different identifiers.  This format is felt to be most appropriate for this patient at this time.  All issues noted in this document were discussed and addressed.  No physical exam was performed (except for noted visual exam findings with Video Visits).    Date:  01/02/2019   ID:  [name], DOB 1956/08/13, MRN [id number]  Patient Location:  [place]  Provider location:   Office    Chief Complaint:  Hypothyroid and prediabetes  History of Present Illness:    [name] is a 63 y.o. female who presents via video conferencing for a telehealth visit today.    The patient does not have symptoms concerning for COVID-19 infection (fever, chills, cough, or new shortness of breath).   Thyroid Problem  Presents for follow-up visit. Patient reports no palpitations. The symptoms have been stable.  Diabetes  She presents for her follow-up diabetic visit. Diabetes type: prediabetes. Her disease course has been stable. There are no hypoglycemic associated symptoms. Pertinent negatives for hypoglycemia include no dizziness. There are no diabetic associated symptoms. Pertinent negatives for diabetes include no chest pain.  Cough  This is a chronic problem. The cough is non-productive. Pertinent negatives include no chest pain. The symptoms are aggravated by pollens. Her past medical history is significant for COPD (no recent exacebation). There is no history of asthma.  Shoulder Pain   Pertinent negatives include no tingling.     Past Medical History:  Diagnosis Date  . Abnormal gait   . Amnesia   . Anemia    hx  . Aneurysm ([place])   . Anxiety   . Asthma   . Atrophic  vaginitis   . Benign hypertensive heart disease   . Cardiomegaly   . Chest pain   . COPD (chronic obstructive pulmonary disease) ([place])     - PFTs  01/24/06 FEV1 69% ratio 68% diffusing capacity 57% with no improvement after B2   - PFT's 10/1/ 09 FEV1 77   ratio  76    dlco                   45            no resp to B2    - Nl alpha one antitripsin level 10/09  . Cough   . Depression   . Difficulty speaking   . Disorder of nasal cavity   . Dyspnea   . Enthesopathy of elbow region   . GERD (gastroesophageal reflux disease)   . Hammer toe   . Headache(784.0)   . Hemiparesis affecting dominant side as late effect of cerebrovascular accident ([place])   . History of kidney stones   . Hyperlipidemia   . Hypertension   . Hypothyroidism   . Increased urinary frequency   . Low compliance bladder   . Malaise and fatigue   . Nonruptured cerebral aneurysm   . Obesity   . Pneumonia    hx  . Pure hypercholesterolemia   . Recurrent major depressive episodes ([place])   . Seizure disorder, secondary ([place])    2-3 focal siezures monthly  . Seizures ([place])   . Short-term memory loss    since stroke  . Skin sensation  disturbance   . Stroke [facility]) 2011   rt sided weakness  . Swelling of limb   . TMJ (temporomandibular joint disorder)   . Urinary tract infectious disease   . Vertigo as late effect of stroke   . Vitamin D deficiency    Past Surgical History:  Procedure Laterality Date  . BACK SURGERY    . CEREBRAL ANGIOGRAM     x3  . COLONOSCOPY WITH PROPOFOL N/A 08/18/2016   Procedure: COLONOSCOPY WITH PROPOFOL;  Surgeon: Carol Ada, MD;  Location: [facility];  Service: Endoscopy;  Laterality: N/A;  . LAPAROSCOPIC HYSTERECTOMY    . MANDIBLE FRACTURE SURGERY    . RADIOLOGY WITH ANESTHESIA N/A 08/21/2013   Procedure: RADIOLOGY WITH ANESTHESIA;  Surgeon: Rob Hickman, MD;  Location: [place];  Service: Radiology;  Laterality: N/A;  . stones     no surgery     Current Meds  Medication  Sig  . albuterol (PROVENTIL HFA;VENTOLIN HFA) 108 (90 Base) MCG/ACT inhaler Inhale 2 puffs into the lungs every 6 (six) hours as needed for wheezing or shortness of breath.  . ALPRAZolam (XANAX) 0.25 MG tablet Take 1 tablet (0.25 mg total) by mouth 2 (two) times daily.  Marland Kitchen aspirin EC 81 MG tablet Take 81 mg by mouth daily.  . B Complex-C (SUPER B COMPLEX PO) Take 1 tablet by mouth daily.  . Cholecalciferol (VITAMIN D-3) 1000 units CAPS Take 1,000 Units by mouth daily.  . citalopram (CELEXA) 40 MG tablet Take 1 tablet (40 mg total) by mouth at bedtime.  . clopidogrel (PLAVIX) 75 MG tablet TAKE 1 TABLET DAILY  . CRANBERRY PO Take 2 tablets by mouth 2 (two) times daily.  . Cyanocobalamin (VITAMIN B-12 PO) Take 1,000 mcg by mouth every morning.  . cyclobenzaprine (FLEXERIL) 10 MG tablet TAKE 1 TABLET AT BEDTIME AS NEEDED  . diclofenac sodium (VOLTAREN) 1 % GEL Apply 2 g topically 3 (three) times daily as needed (for pain).   . fluticasone (FLONASE) 50 MCG/ACT nasal spray Place 2 sprays into both nostrils daily as needed (allergies or post-nasl drip).   Marland Kitchen levETIRAcetam (KEPPRA) 500 MG tablet Take 1 tablet (500 mg total) by mouth every 12 (twelve) hours.  Marland Kitchen levothyroxine (SYNTHROID) 50 MCG tablet TAKE 1 TABLET ON MONDAY THROUGH WEDNESDAY  . levothyroxine (SYNTHROID) 75 MCG tablet Take 75 mcg by mouth in the morning before breakfast on Thursday - Sunday , Take 50 mcg on other days  . losartan (COZAAR) 100 MG tablet Take 100 mg by mouth every morning.   Marland Kitchen MICARDIS 20 MG tablet TAKE 1 TABLET DAILY  . Omega-3 Fatty Acids (FISH OIL) 1000 MG CAPS Take 2,000 mg by mouth 2 (two) times daily.   . rosuvastatin (CRESTOR) 10 MG tablet TAKE 1 TABLET DAILY  . topiramate (TOPAMAX) 25 MG tablet TAKE 1 TABLET IN THE MORNING AND 2 TABLETS IN THE EVENING  . tretinoin (RETIN-A) 0.05 % cream Apply to the skin nightly as directed  . vitamin C (ASCORBIC ACID) 500 MG tablet Take 500 mg by mouth daily.  . vitamin E 1000 UNIT  capsule Take 1,000 Units by mouth at bedtime.  . [DISCONTINUED] levothyroxine (SYNTHROID) 75 MCG tablet Take 75 mcg by mouth in the morning before breakfast on Thursday - Sunday , Take 50 mcg on other days  . [DISCONTINUED] levothyroxine (SYNTHROID, LEVOTHROID) 50 MCG tablet TAKE 1 TABLET ON MONDAY THROUGH THURSDAY     Allergies:   Phenytoin   Social History   Tobacco  Use  . Smoking status: Former Smoker    Last attempt to quit: 09/05/2008    Years since quitting: 10.3  . Smokeless tobacco: Never Used  Substance Use Topics  . Alcohol use: No    Alcohol/week: 0.0 standard drinks    Comment: patient quit alcohol use 2005.   . Drug use: No     Family Hx: The patient's family history includes Cancer in her father; Heart attack in her father; Heart disease in her father and mother; Lung cancer in an other family member. There is no history of Breast cancer.  ROS:   Please see the history of present illness.    Review of Systems  Constitutional: Negative.   Respiratory: Positive for cough (dry cough).   Cardiovascular: Negative for chest pain and palpitations.  Musculoskeletal: Negative for falls.       Right shoulder posterior pain near shoulder blade  Neurological: Negative for dizziness and tingling.  Psychiatric/Behavioral: Negative.     All other systems reviewed and are negative.   Labs/Other Tests and Data Reviewed:    Recent Labs: 09/03/2018: ALT 17; BUN 11; Potassium 4.3; Sodium 143; TSH 0.867 09/18/2018: Creatinine, Ser 1.04   Recent Lipid Panel Lab Results  Component Value Date/Time   CHOL  11/23/2009 04:35 AM    166        ATP III CLASSIFICATION:  <200     mg/dL   Desirable  200-239  mg/dL   Borderline High  >=240    mg/dL   High          TRIG 76 11/23/2009 04:35 AM   HDL 60 11/23/2009 04:35 AM   CHOLHDL 2.8 11/23/2009 04:35 AM   LDLCALC  11/23/2009 04:35 AM    91        Total Cholesterol/HDL:CHD Risk Coronary Heart Disease Risk Table                      Men   Women  1/2 Average Risk   3.4   3.3  Average Risk       5.0   4.4  2 X Average Risk   9.6   7.1  3 X Average Risk  23.4   11.0        Use the calculated Patient Ratio above and the CHD Risk Table to determine the patient's CHD Risk.        ATP III CLASSIFICATION (LDL):  <100     mg/dL   Optimal  100-129  mg/dL   Near or Above                    Optimal  130-159  mg/dL   Borderline  160-189  mg/dL   High  >190     mg/dL   Very High    Wt Readings from Last 3 Encounters:  09/18/18 165 lb 3.2 oz (74.9 kg)  09/03/18 164 lb 6.4 oz (74.6 kg)  09/18/17 161 lb 9.6 oz (73.3 kg)     Exam:    Vital Signs:  Temp 98.4 F (36.9 C) (Oral)     Physical Exam  Constitutional: She is oriented to person, place, and time and well-developed, well-nourished, and in no distress. No distress.  Pulmonary/Chest: Effort normal. No respiratory distress.  Neurological: She is alert and oriented to person, place, and time.  Psychiatric: Mood, memory, affect and judgment normal.    ASSESSMENT & PLAN:    1. Hypertension, unspecified type .  B/P is controlled.  Marland Kitchen BMP ordered to check renal function.  . The importance of regular exercise and dietary modification was stressed to the patient.  . Stressed importance of losing ten percent of her body weight to help with B/P control.  . The weight loss would help with decreasing cardiac and cancer risk as well.   2. Acquired hypothyroidism  Chronic, controlled  Continue with current medications - levothyroxine (SYNTHROID) 75 MCG tablet; Take 75 mcg by mouth in the morning before breakfast on Thursday - Sunday , Take 50 mcg on other days  Dispense: 48 tablet; Refill: 1 - levothyroxine (SYNTHROID) 50 MCG tablet; TAKE 1 TABLET ON MONDAY THROUGH WEDNESDAY  Dispense: 30 tablet; Refill: 1 - TSH - T3 - T4, Free  3. Prediabetes  Chronic, stable  No current medications  Encouraged to limit intake of sugary foods and drinks  Encouraged to  increase physical activity to 150 minutes per week as tolerated - Hemoglobin A1c - BMP8+eGFR     COVID-19 Education: The signs and symptoms of COVID-19 were discussed with the patient and how to seek care for testing (follow up with PCP or arrange E-visit).  The importance of social distancing was discussed today.  Patient Risk:   After full review of this patients clinical status, I feel that they are at least moderate risk at this time.  Time:   Today, I have spent 24 minutes/ seconds with the patient with telehealth technology discussing above diagnoses.     Medication Adjustments/Labs and Tests Ordered: Current medicines are reviewed at length with the patient today.  Concerns regarding medicines are outlined above.   Tests Ordered: Orders Placed This Encounter  Procedures  . Hemoglobin A1c  . BMP8+eGFR  . TSH  . T3  . T4, Free    Medication Changes: Meds ordered this encounter  Medications  . levothyroxine (SYNTHROID) 75 MCG tablet    Sig: Take 75 mcg by mouth in the morning before breakfast on Thursday - Sunday , Take 50 mcg on other days    Dispense:  48 tablet    Refill:  1  . levothyroxine (SYNTHROID) 50 MCG tablet    Sig: TAKE 1 TABLET ON MONDAY THROUGH WEDNESDAY    Dispense:  30 tablet    Refill:  1    Disposition:  Follow up in 3 month(s)  Signed, Minette Brine, FNP

## 2019-01-02 ENCOUNTER — Other Ambulatory Visit: Payer: Medicare Other

## 2019-01-02 ENCOUNTER — Other Ambulatory Visit: Payer: Self-pay | Admitting: Nurse Practitioner

## 2019-01-02 ENCOUNTER — Encounter: Payer: Self-pay | Admitting: Nurse Practitioner

## 2019-01-02 ENCOUNTER — Other Ambulatory Visit: Payer: Self-pay

## 2019-01-02 ENCOUNTER — Ambulatory Visit
Admission: RE | Admit: 2019-01-02 | Discharge: 2019-01-02 | Disposition: A | Discharge status: Home / Self Care | Payer: Medicare Other | Source: Ambulatory Visit | Attending: Nurse Practitioner | Admitting: Nurse Practitioner

## 2019-01-02 DIAGNOSIS — M25511 Pain in right shoulder: Secondary | ICD-10-CM

## 2019-01-02 DIAGNOSIS — R079 Chest pain, unspecified: Secondary | ICD-10-CM | POA: Diagnosis not present

## 2019-01-02 DIAGNOSIS — R7303 Prediabetes: Secondary | ICD-10-CM | POA: Diagnosis not present

## 2019-01-02 DIAGNOSIS — M898X1 Other specified disorders of bone, shoulder: Secondary | ICD-10-CM

## 2019-01-02 DIAGNOSIS — E039 Hypothyroidism, unspecified: Secondary | ICD-10-CM | POA: Diagnosis not present

## 2019-01-03 LAB — BMP8+EGFR
BUN/Creatinine Ratio: 19 (ref 12–28)
BUN: 13 mg/dL (ref 8–27)
CO2: 21 mmol/L (ref 20–29)
Calcium: 8.8 mg/dL (ref 8.7–10.3)
Chloride: 105 mmol/L (ref 96–106)
Creatinine, Ser: 0.67 mg/dL (ref 0.57–1.00)
GFR calc Af Amer: 109 mL/min/{1.73_m2} (ref >59)
GFR calc non Af Amer: 95 mL/min/{1.73_m2} (ref >59)
Glucose: 90 mg/dL (ref 65–99)
Potassium: 3.9 mmol/L (ref 3.5–5.2)
Sodium: 142 mmol/L (ref 134–144)

## 2019-01-03 LAB — HEMOGLOBIN A1C
Est. average glucose Bld gHb Est-mCnc: 120 mg/dL
Hgb A1c MFr Bld: 5.8 % — ABNORMAL HIGH (ref 4.8–5.6)

## 2019-01-03 LAB — T4, FREE: Free T4: 1.08 ng/dL (ref 0.82–1.77)

## 2019-01-03 LAB — TSH: TSH: 0.407 u[IU]/mL — ABNORMAL LOW (ref 0.450–4.500)

## 2019-02-15 ENCOUNTER — Other Ambulatory Visit: Payer: Self-pay | Admitting: Internal Medicine

## 2019-03-25 ENCOUNTER — Other Ambulatory Visit: Payer: Self-pay

## 2019-03-25 ENCOUNTER — Ambulatory Visit (INDEPENDENT_AMBULATORY_CARE_PROVIDER_SITE_OTHER): Payer: Medicare Other | Admitting: Internal Medicine

## 2019-03-25 ENCOUNTER — Encounter: Payer: Self-pay | Admitting: Internal Medicine

## 2019-03-25 VITALS — BP 132/78 | HR 98 | Temp 98.4°F | Ht 62.0 in | Wt 166.2 lb

## 2019-03-25 DIAGNOSIS — I700 Atherosclerosis of aorta: Secondary | ICD-10-CM

## 2019-03-25 DIAGNOSIS — Z122 Encounter for screening for malignant neoplasm of respiratory organs: Secondary | ICD-10-CM | POA: Diagnosis not present

## 2019-03-25 DIAGNOSIS — I10 Essential (primary) hypertension: Secondary | ICD-10-CM

## 2019-03-25 DIAGNOSIS — G8110 Spastic hemiplegia affecting unspecified side: Secondary | ICD-10-CM | POA: Diagnosis not present

## 2019-03-25 DIAGNOSIS — Z6830 Body mass index (BMI) 30.0-30.9, adult: Secondary | ICD-10-CM | POA: Diagnosis not present

## 2019-03-25 DIAGNOSIS — R3915 Urgency of urination: Secondary | ICD-10-CM

## 2019-03-25 DIAGNOSIS — E6609 Other obesity due to excess calories: Secondary | ICD-10-CM

## 2019-03-25 LAB — POCT URINALYSIS DIPSTICK
Bilirubin, UA: NEGATIVE
Blood, UA: NEGATIVE
Glucose, UA: NEGATIVE
Ketones, UA: NEGATIVE
Nitrite, UA: NEGATIVE
Protein, UA: NEGATIVE
Spec Grav, UA: <=1.005 — AB (ref 1.010–1.025)
Urobilinogen, UA: 0.2 E.U./dL
pH, UA: 5.5 (ref 5.0–8.0)

## 2019-03-25 NOTE — Patient Instructions (Signed)
   VIBRATION PLATES - LOOK INTO INDICATIONS   Lung Cancer Screening A lung cancer screening is a test that checks for lung cancer. Lung cancer screening is done to look for lung cancer in its very early stages, before it spreads and becomes harder to treat and before symptoms appear. Finding cancer early improves the chances of successful treatment. It may save your life. Should I be screened for lung cancer? You should be screened for lung cancer if all of these apply:  You currently smoke or you have quit smoking within the past 15 years.  You are 71-26 years old. Screening may be recommended up to age 26 depending on your overall health and other factors.  You are in good general health.  You have a smoking history of 1 pack a day for 30 years or 2 packs a day for 15 years. Screening may also be recommended if you are at high risk for the disease. You may be at high risk if:  You have a family history of lung cancer.  You have been exposed to asbestos.  You have chronic obstructive pulmonary disease (COPD).  You have a history of previous lung cancer. How often should I be screened for lung cancer?  If you are at risk for lung cancer, it is recommended that you are screened once a year. The recommended screening test is a low-dose CT scan. How can I lower my risk of lung cancer? To lower your risk of developing lung cancer:  If you smoke, stop smoking all tobacco products.  Avoid secondhand smoke.  Avoid exposure to radiation.  Avoid exposure to radon gas. Have your home checked for radon regularly.  Avoid things that cause cancer (carcinogens).  Avoid living or working in places with high air pollution. Where to find more information Ask your health care provider about the risks and benefits of screening. More information and resources are available from these organizations:  [place] (ACS): www.cancer.org  American Lung Association: www.lung.org  Contact a health care provider if:  You start to show symptoms of lung cancer, including: ? Coughing that will not go away. ? Wheezing. ? Chest pain. ? Coughing up blood. ? Shortness of breath. ? Weight loss that cannot be explained. ? Constant fatigue. Summary  Lung cancer screening may find lung cancer before symptoms appear. Finding cancer early improves the chances of successful treatment. It may save your life.  If you are at risk for lung cancer, it is recommended that you are screened once a year. The recommended screening test is a low-dose CT scan.  You can make lifestyle changes to lower your risk of lung cancer.  Ask your health care provider about the risks and benefits of screening. This information is not intended to replace advice given to you by your health care provider. Make sure you discuss any questions you have with your health care provider. Document Released: 07/13/2016 Document Revised: 12/14/2018 Document Reviewed: 07/13/2016 Elsevier Patient Education  2020 [employer].

## 2019-03-26 LAB — URINE CULTURE

## 2019-04-01 NOTE — Progress Notes (Signed)
Subjective:     Patient ID: [name] , female    DOB: 19-Oct-1955 , 63 y.o.   MRN: [id number]   Chief Complaint  Patient presents with  . Urinary Frequency    HPI  Urinary Frequency  This is a recurrent problem. The problem occurs intermittently. The problem has been gradually worsening. Associated symptoms include frequency.     Past Medical History:  Diagnosis Date  . Abnormal gait   . Amnesia   . Anemia    hx  . Aneurysm ([place])   . Anxiety   . Asthma   . Atrophic vaginitis   . Benign hypertensive heart disease   . Cardiomegaly   . Chest pain   . COPD (chronic obstructive pulmonary disease) ([place])     - PFTs  01/24/06 FEV1 69% ratio 68% diffusing capacity 57% with no improvement after B2   - PFT's 10/1/ 09 FEV1 77   ratio  76    dlco                   45            no resp to B2    - Nl alpha one antitripsin level 10/09  . Cough   . Depression   . Difficulty speaking   . Disorder of nasal cavity   . Dyspnea   . Enthesopathy of elbow region   . GERD (gastroesophageal reflux disease)   . Hammer toe   . Headache(784.0)   . Hemiparesis affecting dominant side as late effect of cerebrovascular accident ([place])   . History of kidney stones   . Hyperlipidemia   . Hypertension   . Hypothyroidism   . Increased urinary frequency   . Low compliance bladder   . Malaise and fatigue   . Nonruptured cerebral aneurysm   . Obesity   . Pneumonia    hx  . Pure hypercholesterolemia   . Recurrent major depressive episodes ([place])   . Seizure disorder, secondary ([place])    2-3 focal siezures monthly  . Seizures ([place])   . Short-term memory loss    since stroke  . Skin sensation disturbance   . Stroke [facility]) 2011   rt sided weakness  . Swelling of limb   . TMJ (temporomandibular joint disorder)   . Urinary tract infectious disease   . Vertigo as late effect of stroke   . Vitamin D deficiency      Family History  Problem Relation Age of Onset  . Heart disease Mother    . Heart disease Father   . Heart attack Father   . Cancer Father        prostate to lymph node  . Lung cancer Other   . Breast cancer Neg Hx      Current Outpatient Medications:  .  albuterol (PROVENTIL HFA;VENTOLIN HFA) 108 (90 Base) MCG/ACT inhaler, Inhale 2 puffs into the lungs every 6 (six) hours as needed for wheezing or shortness of breath., Disp: 1 Inhaler, Rfl: 2 .  ALPRAZolam (XANAX) 0.25 MG tablet, Take 1 tablet (0.25 mg total) by mouth 2 (two) times daily., Disp: 90 tablet, Rfl: 2 .  aspirin EC 81 MG tablet, Take 81 mg by mouth daily., Disp: , Rfl:  .  B Complex-C (SUPER B COMPLEX PO), Take 1 tablet by mouth daily., Disp: , Rfl:  .  Cholecalciferol (VITAMIN D-3) 1000 units CAPS, Take 1,000 Units by mouth daily., Disp: , Rfl:  .  citalopram (  CELEXA) 40 MG tablet, Take 1 tablet (40 mg total) by mouth at bedtime., Disp: 90 tablet, Rfl: 1 .  clopidogrel (PLAVIX) 75 MG tablet, TAKE 1 TABLET DAILY, Disp: 90 tablet, Rfl: 4 .  CRANBERRY PO, Take 2 tablets by mouth 2 (two) times daily., Disp: , Rfl:  .  Cyanocobalamin (VITAMIN B-12 PO), Take 1,000 mcg by mouth every morning., Disp: , Rfl:  .  cyclobenzaprine (FLEXERIL) 10 MG tablet, TAKE 1 TABLET AT BEDTIME AS NEEDED, Disp: 90 tablet, Rfl: 3 .  diclofenac sodium (VOLTAREN) 1 % GEL, Apply 2 g topically 3 (three) times daily as needed (for pain). , Disp: , Rfl:  .  fluticasone (FLONASE) 50 MCG/ACT nasal spray, Place 2 sprays into both nostrils daily as needed (allergies or post-nasl drip). , Disp: , Rfl: 3 .  levETIRAcetam (KEPPRA) 500 MG tablet, Take 1 tablet (500 mg total) by mouth every 12 (twelve) hours., Disp: 180 tablet, Rfl: 3 .  levothyroxine (SYNTHROID) 50 MCG tablet, TAKE 1 TABLET ON MONDAY THROUGH WEDNESDAY, Disp: 30 tablet, Rfl: 1 .  levothyroxine (SYNTHROID) 75 MCG tablet, Take 75 mcg by mouth in the morning before breakfast on Thursday - Sunday , Take 50 mcg on other days, Disp: 48 tablet, Rfl: 1 .  losartan (COZAAR) 100 MG  tablet, Take 100 mg by mouth every morning. , Disp: , Rfl:  .  MICARDIS 20 MG tablet, TAKE 1 TABLET DAILY, Disp: 90 tablet, Rfl: 4 .  Omega-3 Fatty Acids (FISH OIL) 1000 MG CAPS, Take 2,000 mg by mouth 2 (two) times daily. , Disp: , Rfl:  .  rosuvastatin (CRESTOR) 10 MG tablet, TAKE 1 TABLET DAILY, Disp: 90 tablet, Rfl: 2 .  topiramate (TOPAMAX) 25 MG tablet, TAKE 1 TABLET IN THE MORNING AND 2 TABLETS IN THE EVENING, Disp: 270 tablet, Rfl: 3 .  tretinoin (RETIN-A) 0.05 % cream, Apply to the skin nightly as directed, Disp: , Rfl: 3 .  vitamin C (ASCORBIC ACID) 500 MG tablet, Take 500 mg by mouth daily., Disp: , Rfl:  .  vitamin E 1000 UNIT capsule, Take 1,000 Units by mouth at bedtime., Disp: , Rfl:    Allergies  Allergen Reactions  . Phenytoin Anaphylaxis, Hives and Swelling     Review of Systems  Constitutional: Negative.   Respiratory: Negative.   Cardiovascular: Negative.   Gastrointestinal: Negative.   Genitourinary: Positive for frequency. Negative for pelvic pain.  Neurological: Negative.   Psychiatric/Behavioral: Negative.      Today's Vitals   03/25/19 0928  BP: 132/78  Pulse: 98  Temp: 98.4 F (36.9 C)  TempSrc: Oral  SpO2: 96%  Weight: 166 lb 3.2 oz (75.4 kg)  Height: 5\' 2"  (1.575 m)   Body mass index is 30.4 kg/m.   Objective:  Physical Exam Vitals signs and nursing note reviewed.  Constitutional:      Appearance: Normal appearance.  HENT:     Head: Normocephalic and atraumatic.  Cardiovascular:     Rate and Rhythm: Normal rate and regular rhythm.     Heart sounds: Normal heart sounds.  Pulmonary:     Effort: Pulmonary effort is normal.     Breath sounds: Normal breath sounds.  Musculoskeletal:     Comments: Ambulatory with four prong cane  Skin:    General: Skin is warm.  Neurological:     General: No focal deficit present.     Mental Status: She is alert.  Psychiatric:        Mood and Affect:  Mood normal.        Behavior: Behavior normal.          Assessment And Plan:     1. Urinary urgency  Chronic. I will refer her to Urology for further evaluation. She also has recurrent UTI.   - Culture, Urine - POCT Urinalysis Dipstick (38329) - Ambulatory referral to Urology  2. Spastic hemiparesis (HCC)  Chronic, yet stable.   3. Class 1 obesity due to excess calories with serious comorbidity and body mass index (BMI) of 30.0 to 30.9 in adult  Importance of achieving optimal weight to decrease risk of cardiovascular disease and cancers was discussed with the patient in full detail. She is encouraged to start slowly - start with 10 minutes twice daily at least three to four days per week and to gradually build to 30 minutes five days weekly. She was given tips to incorporate more activity into her daily routine - take stairs when possible, park farther away from grocery stores, etc.    4. Aortic atherosclerosis ([place])  Chronic. Importance of statin compliance was discussed with the patient.   5. Essential hypertension  Fair control. She will continue with current meds. She is encouraged to avoid adding salt to her foods.   6. Encounter for screening for malignant neoplasm of respiratory organs  I plan to schedule for low dose CT chest to screen for lung CA. Unfortunately, I am unable to put in order, it is being blocked by insurance. I will send manual referral. She has greater than 30+ packyear history and has quit within the past 15 years.   [name], MD    THE PATIENT IS ENCOURAGED TO PRACTICE SOCIAL DISTANCING DUE TO THE COVID-19 PANDEMIC.

## 2019-04-02 ENCOUNTER — Encounter: Payer: Self-pay | Admitting: Internal Medicine

## 2019-04-05 ENCOUNTER — Other Ambulatory Visit: Payer: Self-pay | Admitting: Internal Medicine

## 2019-04-05 DIAGNOSIS — Z122 Encounter for screening for malignant neoplasm of respiratory organs: Secondary | ICD-10-CM

## 2019-04-08 ENCOUNTER — Other Ambulatory Visit: Payer: Self-pay

## 2019-04-08 MED ORDER — CELEXA 40 MG PO TABS: 40.0000 mg | ORAL_TABLET | Freq: Every day | ORAL | 1 refills | Status: DC

## 2019-04-08 NOTE — Telephone Encounter (Signed)
The pt requested that brand name celexa be sent to [facility] because the pharmacy is out of the generic.  The prescription was faxed.

## 2019-04-09 ENCOUNTER — Other Ambulatory Visit: Payer: Self-pay | Admitting: Internal Medicine

## 2019-04-09 DIAGNOSIS — Z1231 Encounter for screening mammogram for malignant neoplasm of breast: Secondary | ICD-10-CM

## 2019-04-09 DIAGNOSIS — H52223 Regular astigmatism, bilateral: Secondary | ICD-10-CM | POA: Diagnosis not present

## 2019-04-09 DIAGNOSIS — H353122 Nonexudative age-related macular degeneration, left eye, intermediate dry stage: Secondary | ICD-10-CM | POA: Diagnosis not present

## 2019-04-09 DIAGNOSIS — H524 Presbyopia: Secondary | ICD-10-CM | POA: Diagnosis not present

## 2019-04-09 DIAGNOSIS — H10413 Chronic giant papillary conjunctivitis, bilateral: Secondary | ICD-10-CM | POA: Diagnosis not present

## 2019-04-09 DIAGNOSIS — Z79899 Other long term (current) drug therapy: Secondary | ICD-10-CM | POA: Diagnosis not present

## 2019-04-09 DIAGNOSIS — H2513 Age-related nuclear cataract, bilateral: Secondary | ICD-10-CM | POA: Diagnosis not present

## 2019-04-09 DIAGNOSIS — H353211 Exudative age-related macular degeneration, right eye, with active choroidal neovascularization: Secondary | ICD-10-CM | POA: Diagnosis not present

## 2019-04-09 DIAGNOSIS — H04123 Dry eye syndrome of bilateral lacrimal glands: Secondary | ICD-10-CM | POA: Diagnosis not present

## 2019-04-11 ENCOUNTER — Other Ambulatory Visit: Payer: Self-pay

## 2019-04-11 MED ORDER — CITALOPRAM HYDROBROMIDE 40 MG PO TABS: 40.0000 mg | ORAL_TABLET | Freq: Every day | ORAL | 1 refills | Status: DC

## 2019-04-17 ENCOUNTER — Encounter: Payer: Self-pay | Admitting: Radiology

## 2019-04-19 ENCOUNTER — Ambulatory Visit
Admission: RE | Admit: 2019-04-19 | Discharge: 2019-04-19 | Disposition: A | Discharge status: Home / Self Care | Payer: Medicare Other | Source: Ambulatory Visit | Attending: Internal Medicine | Admitting: Internal Medicine

## 2019-04-19 DIAGNOSIS — Z122 Encounter for screening for malignant neoplasm of respiratory organs: Secondary | ICD-10-CM

## 2019-04-19 DIAGNOSIS — Z87891 Personal history of nicotine dependence: Secondary | ICD-10-CM | POA: Diagnosis not present

## 2019-04-21 ENCOUNTER — Other Ambulatory Visit: Payer: Self-pay | Admitting: Internal Medicine

## 2019-04-21 MED ORDER — ALPRAZOLAM 0.25 MG PO TABS: 0.2500 mg | ORAL_TABLET | Freq: Two times a day (BID) | ORAL | 1 refills | Status: DC

## 2019-04-30 DIAGNOSIS — H2513 Age-related nuclear cataract, bilateral: Secondary | ICD-10-CM | POA: Diagnosis not present

## 2019-04-30 DIAGNOSIS — H353122 Nonexudative age-related macular degeneration, left eye, intermediate dry stage: Secondary | ICD-10-CM | POA: Diagnosis not present

## 2019-04-30 DIAGNOSIS — H353211 Exudative age-related macular degeneration, right eye, with active choroidal neovascularization: Secondary | ICD-10-CM | POA: Diagnosis not present

## 2019-05-20 ENCOUNTER — Other Ambulatory Visit: Payer: Self-pay

## 2019-05-20 ENCOUNTER — Ambulatory Visit (INDEPENDENT_AMBULATORY_CARE_PROVIDER_SITE_OTHER): Payer: Medicare Other

## 2019-05-20 VITALS — BP 140/78 | HR 72 | Temp 98.4°F | Wt 165.0 lb

## 2019-05-20 DIAGNOSIS — Z23 Encounter for immunization: Secondary | ICD-10-CM | POA: Diagnosis not present

## 2019-05-20 NOTE — Progress Notes (Signed)
Pt presents today for flu shot 

## 2019-05-22 ENCOUNTER — Telehealth: Payer: Self-pay | Admitting: Internal Medicine

## 2019-05-22 NOTE — Chronic Care Management (AMB) (Signed)
Chronic Care Management   Note  05/22/2019 Name: SHAKYIA BOSSO MRN: [id number] DOB: [birth year]  Felisa Bonier Hefty is a 63 y.o. year old female who is a primary care patient of Glendale Chard, MD. I reached out to [name] by phone today in response to a referral sent by Ms. [employer] health plan.    Ms. Calixte was given information about Chronic Care Management services today including:  1. CCM service includes personalized support from designated clinical staff supervised by her physician, including individualized plan of care and coordination with other care providers 2. 24/7 contact phone numbers for assistance for urgent and routine care needs. 3. Service will only be billed when office clinical staff spend 20 minutes or more in a month to coordinate care. 4. Only one practitioner may furnish and bill the service in a calendar month. 5. The patient may stop CCM services at any time (effective at the end of the month) by phone call to the office staff. 6. The patient will be responsible for cost sharing (co-pay) of up to 20% of the service fee (after annual deductible is met).  Patient agreed to services and verbal consent obtained.   Follow up plan: Telephone appointment with CCM team member scheduled for: 06/26/2019  [place]  ??[email or web address]   ??[phone]

## 2019-05-24 ENCOUNTER — Ambulatory Visit
Admission: RE | Admit: 2019-05-24 | Discharge: 2019-05-24 | Disposition: A | Discharge status: Home / Self Care | Payer: Medicare Other | Source: Ambulatory Visit | Attending: Internal Medicine | Admitting: Internal Medicine

## 2019-05-24 ENCOUNTER — Other Ambulatory Visit: Payer: Self-pay

## 2019-05-24 DIAGNOSIS — Z1231 Encounter for screening mammogram for malignant neoplasm of breast: Secondary | ICD-10-CM

## 2019-05-28 DIAGNOSIS — H353211 Exudative age-related macular degeneration, right eye, with active choroidal neovascularization: Secondary | ICD-10-CM | POA: Diagnosis not present

## 2019-05-28 DIAGNOSIS — H353122 Nonexudative age-related macular degeneration, left eye, intermediate dry stage: Secondary | ICD-10-CM | POA: Diagnosis not present

## 2019-06-03 DIAGNOSIS — H353211 Exudative age-related macular degeneration, right eye, with active choroidal neovascularization: Secondary | ICD-10-CM | POA: Diagnosis not present

## 2019-06-03 DIAGNOSIS — H2513 Age-related nuclear cataract, bilateral: Secondary | ICD-10-CM | POA: Diagnosis not present

## 2019-06-03 DIAGNOSIS — H353122 Nonexudative age-related macular degeneration, left eye, intermediate dry stage: Secondary | ICD-10-CM | POA: Diagnosis not present

## 2019-06-03 DIAGNOSIS — H1131 Conjunctival hemorrhage, right eye: Secondary | ICD-10-CM | POA: Diagnosis not present

## 2019-06-11 ENCOUNTER — Other Ambulatory Visit: Payer: Self-pay

## 2019-06-11 ENCOUNTER — Encounter: Payer: Self-pay | Admitting: Internal Medicine

## 2019-06-11 ENCOUNTER — Ambulatory Visit (INDEPENDENT_AMBULATORY_CARE_PROVIDER_SITE_OTHER): Payer: Medicare Other | Admitting: Internal Medicine

## 2019-06-11 VITALS — BP 130/68 | HR 96 | Temp 98.3°F | Ht 61.2 in | Wt 168.0 lb

## 2019-06-11 DIAGNOSIS — E6609 Other obesity due to excess calories: Secondary | ICD-10-CM

## 2019-06-11 DIAGNOSIS — Z6831 Body mass index (BMI) 31.0-31.9, adult: Secondary | ICD-10-CM

## 2019-06-11 DIAGNOSIS — J302 Other seasonal allergic rhinitis: Secondary | ICD-10-CM

## 2019-06-11 DIAGNOSIS — F331 Major depressive disorder, recurrent, moderate: Secondary | ICD-10-CM

## 2019-06-11 DIAGNOSIS — H3530 Unspecified macular degeneration: Secondary | ICD-10-CM | POA: Diagnosis not present

## 2019-06-11 DIAGNOSIS — I10 Essential (primary) hypertension: Secondary | ICD-10-CM

## 2019-06-11 DIAGNOSIS — R3915 Urgency of urination: Secondary | ICD-10-CM

## 2019-06-11 DIAGNOSIS — R7309 Other abnormal glucose: Secondary | ICD-10-CM

## 2019-06-11 DIAGNOSIS — J449 Chronic obstructive pulmonary disease, unspecified: Secondary | ICD-10-CM

## 2019-06-11 DIAGNOSIS — R400 Somnolence: Secondary | ICD-10-CM

## 2019-06-11 DIAGNOSIS — E039 Hypothyroidism, unspecified: Secondary | ICD-10-CM

## 2019-06-11 LAB — POCT URINALYSIS DIPSTICK
Bilirubin, UA: NEGATIVE
Blood, UA: NEGATIVE
Glucose, UA: NEGATIVE
Ketones, UA: NEGATIVE
Nitrite, UA: NEGATIVE
Protein, UA: NEGATIVE
Spec Grav, UA: 1.01 (ref 1.010–1.025)
Urobilinogen, UA: 0.2 E.U./dL
pH, UA: 5.5 (ref 5.0–8.0)

## 2019-06-11 NOTE — Progress Notes (Signed)
Subjective:     Patient ID: [name] , female    DOB: 09/25/1955 , 63 y.o.   MRN: [id number]   Chief Complaint  Patient presents with  . Thyroid Problem  . Hypertension    HPI  Thyroid Problem Presents for follow-up visit. Symptoms include anxiety, fatigue and weight gain. Patient reports no cold intolerance, leg swelling, menstrual problem or palpitations. The symptoms have been stable.  Hypertension This is a chronic problem. The current episode started more than 1 year ago. The problem has been gradually improving since onset. The problem is controlled. Pertinent negatives include no blurred vision, chest pain, palpitations or shortness of breath. The current treatment provides moderate improvement. Compliance problems include exercise.  Identifiable causes of hypertension include a thyroid problem.     Past Medical History:  Diagnosis Date  . Abnormal gait   . Amnesia   . Anemia    hx  . Aneurysm ([place])   . Anxiety   . Asthma   . Atrophic vaginitis   . Benign hypertensive heart disease   . Cardiomegaly   . Chest pain   . COPD (chronic obstructive pulmonary disease) ([place])     - PFTs  01/24/06 FEV1 69% ratio 68% diffusing capacity 57% with no improvement after B2   - PFT's 10/1/ 09 FEV1 77   ratio  76    dlco                   45            no resp to B2    - Nl alpha one antitripsin level 10/09  . Cough   . Depression   . Difficulty speaking   . Disorder of nasal cavity   . Dyspnea   . Enthesopathy of elbow region   . GERD (gastroesophageal reflux disease)   . Hammer toe   . Headache(784.0)   . Hemiparesis affecting dominant side as late effect of cerebrovascular accident ([place])   . History of kidney stones   . Hyperlipidemia   . Hypertension   . Hypothyroidism   . Increased urinary frequency   . Low compliance bladder   . Macular degeneration of both eyes   . Malaise and fatigue   . Nonruptured cerebral aneurysm   . Obesity   . Pneumonia    hx  .  Pure hypercholesterolemia   . Recurrent major depressive episodes ([place])   . Seizure disorder, secondary ([place])    2-3 focal siezures monthly  . Seizures ([place])   . Short-term memory loss    since stroke  . Skin sensation disturbance   . Stroke [facility]) 2011   rt sided weakness  . Swelling of limb   . TMJ (temporomandibular joint disorder)   . Urinary tract infectious disease   . Vertigo as late effect of stroke   . Vitamin D deficiency      Family History  Problem Relation Age of Onset  . Heart disease Mother   . Heart disease Father   . Heart attack Father   . Cancer Father        prostate to lymph node  . Lung cancer Other   . Breast cancer Neg Hx      Current Outpatient Medications:  .  albuterol (PROVENTIL HFA;VENTOLIN HFA) 108 (90 Base) MCG/ACT inhaler, Inhale 2 puffs into the lungs every 6 (six) hours as needed for wheezing or shortness of breath., Disp: 1 Inhaler, Rfl: 2 .  ALPRAZolam (XANAX) 0.25 MG tablet, Take 1 tablet (0.25 mg total) by mouth 2 (two) times daily. As needed, Disp: 90 tablet, Rfl: 1 .  aspirin EC 81 MG tablet, Take 81 mg by mouth daily., Disp: , Rfl:  .  B Complex-C (SUPER B COMPLEX PO), Take 1 tablet by mouth daily., Disp: , Rfl:  .  Cholecalciferol (VITAMIN D-3) 1000 units CAPS, Take 1,000 Units by mouth daily., Disp: , Rfl:  .  citalopram (CELEXA) 40 MG tablet, Take 1 tablet (40 mg total) by mouth at bedtime., Disp: 90 tablet, Rfl: 1 .  clopidogrel (PLAVIX) 75 MG tablet, TAKE 1 TABLET DAILY, Disp: 90 tablet, Rfl: 4 .  CRANBERRY PO, Take 2 tablets by mouth 2 (two) times daily., Disp: , Rfl:  .  cyclobenzaprine (FLEXERIL) 10 MG tablet, TAKE 1 TABLET AT BEDTIME AS NEEDED, Disp: 90 tablet, Rfl: 3 .  diclofenac sodium (VOLTAREN) 1 % GEL, Apply 2 g topically 3 (three) times daily as needed (for pain). , Disp: , Rfl:  .  fluticasone (FLONASE) 50 MCG/ACT nasal spray, Place 2 sprays into both nostrils daily as needed (allergies or post-nasl drip). , Disp: , Rfl:  3 .  levETIRAcetam (KEPPRA) 500 MG tablet, Take 1 tablet (500 mg total) by mouth every 12 (twelve) hours., Disp: 180 tablet, Rfl: 3 .  levothyroxine (SYNTHROID) 50 MCG tablet, TAKE 1 TABLET ON MONDAY THROUGH WEDNESDAY, Disp: 30 tablet, Rfl: 1 .  levothyroxine (SYNTHROID) 75 MCG tablet, Take 75 mcg by mouth in the morning before breakfast on Thursday - Sunday , Take 50 mcg on other days, Disp: 48 tablet, Rfl: 1 .  losartan (COZAAR) 100 MG tablet, Take 100 mg by mouth every morning. , Disp: , Rfl:  .  MICARDIS 20 MG tablet, TAKE 1 TABLET DAILY, Disp: 90 tablet, Rfl: 4 .  Omega-3 Fatty Acids (FISH OIL) 1000 MG CAPS, Take 2,000 mg by mouth 2 (two) times daily. , Disp: , Rfl:  .  rosuvastatin (CRESTOR) 10 MG tablet, TAKE 1 TABLET DAILY, Disp: 90 tablet, Rfl: 2 .  topiramate (TOPAMAX) 25 MG tablet, TAKE 1 TABLET IN THE MORNING AND 2 TABLETS IN THE EVENING, Disp: 270 tablet, Rfl: 3 .  tretinoin (RETIN-A) 0.05 % cream, Apply to the skin nightly as directed, Disp: , Rfl: 3 .  vitamin C (ASCORBIC ACID) 500 MG tablet, Take 500 mg by mouth daily., Disp: , Rfl:  .  vitamin E 1000 UNIT capsule, Take 1,000 Units by mouth at bedtime., Disp: , Rfl:  .  Cyanocobalamin (VITAMIN B-12 PO), Take 1,000 mcg by mouth every morning., Disp: , Rfl:    Allergies  Allergen Reactions  . Phenytoin Anaphylaxis, Hives and Swelling     Review of Systems  Constitutional: Positive for fatigue and weight gain.  Eyes: Negative for blurred vision.  Respiratory: Negative for shortness of breath.   Cardiovascular: Negative for chest pain and palpitations.  Endocrine: Negative for cold intolerance.  Genitourinary: Negative for menstrual problem.  Psychiatric/Behavioral: Positive for dysphoric mood. The patient is nervous/anxious.        She reports feeling more irritated than usual. She is not sure why she is so "snappy" with her husband.      Today's Vitals   06/11/19 1033  BP: 130/68  Pulse: 96  Temp: 98.3 F (36.8 C)   TempSrc: Oral  SpO2: 96%  Weight: 168 lb (76.2 kg)  Height: 5' 1.2" (1.554 m)   Body mass index is 31.54 kg/m.   Objective:  Physical Exam Vitals signs and nursing note reviewed.  Constitutional:      Appearance: Normal appearance.  HENT:     Head: Normocephalic and atraumatic.  Cardiovascular:     Rate and Rhythm: Normal rate and regular rhythm.     Heart sounds: Normal heart sounds.  Pulmonary:     Effort: Pulmonary effort is normal.     Breath sounds: Normal breath sounds.  Skin:    General: Skin is warm.  Neurological:     General: No focal deficit present.     Mental Status: She is alert.  Psychiatric:        Mood and Affect: Mood normal.        Behavior: Behavior normal.         Assessment And Plan:     1. Acquired hypothyroidism  I will check thyroid panel and adjust meds as needed.   2. Essential hypertension  Chronic, controlled. She will continue with current meds. I will check a CMP today. She is encouraged to avoid adding salt to her foods.   3. Urinary urgency  Chronic. I will check u/a today. Pt is aware if abnormal, I will check urine culture. She is in agreement with treatment plan.   - POCT Urinalysis Dipstick (81002)  4. Moderate episode of recurrent major depressive disorder (HCC)  Chronic, she is agreeable to Psychiatry referral.  Additionally, she has not been taking lexapro as prescribed. She is encouraged to resume meds, call me in two weeks to give an update on how she feels.   - Ambulatory referral to Psychiatry  5. Macular degeneration of right eye, unspecified type  Chronic, as per Ophthalmology. .  6. Seasonal allergic rhinitis, unspecified trigger  She will continue with OTC meds as needed. She will let me know if her sx persist/worsen.   7. Daytime somnolence  She agrees to Neuro evaluation for sleep apnea.  - Ambulatory referral to Neurology  8. Chronic obstructive pulmonary disease, unspecified COPD type  (HCC)  Chronic, yet stable. She has extensive h/o tobacco use. She was congratulated on her continued abstinence.   9. Class 1 obesity due to excess calories with serious comorbidity and body mass index (BMI) of 31.0 to 31.9 in adult  She is encouraged to strive to lose ten pounds to decrease cardiac risk.  [name], MD    THE PATIENT IS ENCOURAGED TO PRACTICE SOCIAL DISTANCING DUE TO THE COVID-19 PANDEMIC.

## 2019-06-11 NOTE — Patient Instructions (Signed)
Please check medications at home - are you on losartan OR telmisartan?

## 2019-06-12 LAB — CMP14+EGFR
ALT: 17 IU/L (ref 0–32)
AST: 24 IU/L (ref 0–40)
Albumin/Globulin Ratio: 1.7 (ref 1.2–2.2)
Albumin: 4.3 g/dL (ref 3.8–4.8)
Alkaline Phosphatase: 124 IU/L — ABNORMAL HIGH (ref 39–117)
BUN/Creatinine Ratio: 16 (ref 12–28)
BUN: 11 mg/dL (ref 8–27)
Bilirubin Total: 0.4 mg/dL (ref 0.0–1.2)
CO2: 23 mmol/L (ref 20–29)
Calcium: 9.5 mg/dL (ref 8.7–10.3)
Chloride: 105 mmol/L (ref 96–106)
Creatinine, Ser: 0.69 mg/dL (ref 0.57–1.00)
GFR calc Af Amer: 108 mL/min/{1.73_m2} (ref >59)
GFR calc non Af Amer: 94 mL/min/{1.73_m2} (ref >59)
Globulin, Total: 2.5 g/dL (ref 1.5–4.5)
Glucose: 88 mg/dL (ref 65–99)
Potassium: 4.4 mmol/L (ref 3.5–5.2)
Sodium: 142 mmol/L (ref 134–144)
Total Protein: 6.8 g/dL (ref 6.0–8.5)

## 2019-06-12 LAB — T4, FREE: Free T4: 1.11 ng/dL (ref 0.82–1.77)

## 2019-06-12 LAB — HEMOGLOBIN A1C
Est. average glucose Bld gHb Est-mCnc: 123 mg/dL
Hgb A1c MFr Bld: 5.9 % — ABNORMAL HIGH (ref 4.8–5.6)

## 2019-06-12 LAB — TSH: TSH: 0.489 u[IU]/mL (ref 0.450–4.500)

## 2019-06-13 ENCOUNTER — Other Ambulatory Visit: Payer: Self-pay

## 2019-06-13 ENCOUNTER — Ambulatory Visit (INDEPENDENT_AMBULATORY_CARE_PROVIDER_SITE_OTHER): Payer: Medicare Other | Admitting: Neurology

## 2019-06-13 ENCOUNTER — Encounter: Payer: Self-pay | Admitting: Neurology

## 2019-06-13 VITALS — BP 130/82 | HR 86 | Temp 98.9°F | Ht 61.5 in | Wt 166.0 lb

## 2019-06-13 DIAGNOSIS — G40909 Epilepsy, unspecified, not intractable, without status epilepticus: Secondary | ICD-10-CM

## 2019-06-13 DIAGNOSIS — J432 Centrilobular emphysema: Secondary | ICD-10-CM | POA: Diagnosis not present

## 2019-06-13 DIAGNOSIS — G253 Myoclonus: Secondary | ICD-10-CM

## 2019-06-13 DIAGNOSIS — I69351 Hemiplegia and hemiparesis following cerebral infarction affecting right dominant side: Secondary | ICD-10-CM | POA: Diagnosis not present

## 2019-06-13 DIAGNOSIS — I671 Cerebral aneurysm, nonruptured: Secondary | ICD-10-CM | POA: Diagnosis not present

## 2019-06-13 DIAGNOSIS — G478 Other sleep disorders: Secondary | ICD-10-CM | POA: Diagnosis not present

## 2019-06-13 DIAGNOSIS — G4719 Other hypersomnia: Secondary | ICD-10-CM | POA: Diagnosis not present

## 2019-06-13 NOTE — Progress Notes (Signed)
SLEEP MEDICINE CLINIC    Provider:  Larey Seat, MD  Primary Care Physician:  Glendale Chard, [address] [place] [address]     Referring Provider: Glendale Chard, [address] [place],  [place] [address]          Chief Complaint according to patient   Patient presents with:     New Patient (Initial Visit)           HISTORY OF PRESENT ILLNESS:  [name] is a 63 y.o. year old  Caucasian female patient seen here as a referral on 06/13/2019 . She is an active patient of Dr. Leonie Man, who has had Wendy Rubin, NP,  follow her for years, too. Last seen 09-24-2018.   This is an evaluation for sleep apnea, requested by Dr Baird Cancer, MD.   Chief concern according to patient : [name] explains that she has since she suffered a cerebral bleed been followed by Dr. Leonie Man , aphasia, acalculia, and severe gait instability and uses a 4 pronged cane to walk steadily. She was an Optometrist-  Sleep relevant medical history: There was some memory loss as well associated with the stroke, in relation to her current chief complaint of excessive daytime sleepiness her brain injury-bleed certainly plays a role but also a moderate episode of recurrent major depression, seasonal allergic rhinitis, a body mass index of 31, and emphysema- SOB/ COPD.   I have the pleasure of seeing [name] today, a right -handed White or Caucasian female with a possible sleep disorder.  She  has a past medical history of CNS related abnormal gait,Anemia, cerebral Aneurysm bleed, Asthma, Aphasia,  Hypertensive heart disease with Cardiomegaly, Chest pain, COPD (chronic obstructive pulmonary disease), tobacco use (HCC), Cough,  , Disorder of nasal cavity, Dyspnea, Enthesopathy of elbow region, GERD (gastroesophageal reflux disease), Hammer toe, Headache(784.0), Hemiparesis affecting dominant side as late effect of cerebrovascular accident ([place]), History of  kidney stones, Hyperlipidemia, Hypertension, controlled Hypothyroidism, Increased urinary frequency, Low compliance bladder, Macular degeneration of both eyes, Malaise and fatigue, Nonruptured cerebral aneurysm, Obesity, Pneumonia, Pure hypercholesterolemia, Recurrent major depressive episodes ([place]), Seizure disorder, secondary ([place]), Short-term memory loss, Skin sensation disturbance, Stroke ([place]) (2011), TMJ (temporomandibular joint disorder), Urinary tract infectious disease, Vertigo as late effect of stroke, and Vitamin D deficiency.  (Dr Leonie Man CM  : 09-18-2018 Follow-up for nontraumatic intracerebral hemorrhage, aneurysm, CVA- abnormality of gait, right foot drop, complex partial seizure disorder,aphasia,  and vascular headaches, COPD.  [name], 63 year old female returns for followup. She was last seen in our office in 2019. 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 She states her headaches are doing well on Topamax 75 mg daily however she recently went to the eye doctor and was told she probably needed to discontinue the Topamax. I see nothing in the record. Her trem ors were doing well,not bothersome She remains on Keppra She has not had any further seizures. She does complain of occasional fluttering of both her eyes but this is transient and is not similar to her prior seizures. She continues to have right-sided weakness and gait difficulty and incoordination is unchanged. She walks with a single-point cane. She is no longer using her TENS unit as the battery needs replacing. She is also on aspirin and Plavix and CT angio is followed by Dr. Rennis Chris now  yearly , remains cognitive stable. She has had one fall in the last year. She returns for routine follow up on 09-24-2018.)   The patient had the first sleep study in the year 2009(?) lLong before her cerebral aneurysm  bleed.    Family medical /sleep history: brother and father with OSA, insomnia, sleep walkers.    Social history: Patient is disabled from accounting profession since 2011 and lives in a household with 2 persons.Family status is married, with  2 adult children and grandchildren.  Pets are 1 dog. Tobacco use quit 3-25 th-2011. ETOH use -not since 2008,  Caffeine intake in form of Coffee( none ) Soda( never ) Tea ( 1/month ) or energy drinks. Regular exercise- none.   Gym closure affected her habits.    Sleep habits are as follows: The patient's dinner time is between 6-7 PM. The patient goes to bed at 10 PM and watches TV in bed for 30 minutes- she continues to sleep for a total time of 8-10 hours, wakes for one bathroom break, the first time at 3 AM.  Husband describes her as a restless sleeper.  The preferred sleep position is lateral , with the support of one pillow. She uses a pillow to support her neck from the left. Dreams are reportedly infrequent and when dreaming its nightmares/ vivid.   Has no usual rise time. The patient wakes up spontaneously between a 8 and 9 AM.  She reports not feeling refreshed or restored in AM, with symptoms such as dry mouth, almost daily morning headaches, and residual fatigue. The last 2 month she has had trouble breathing, coughing a interrupting her sleep.  Naps are taken daily , lasting from 2 to 3 hours and are not more refreshing than nocturnal sleep.    Review of Systems: Out of a complete 14 system review, the patient complains of only the following symptoms, and all other reviewed systems are negative.:  Fatigue, sleepiness, snoring, fragmented sleep, BOPD emphysema, inhaler dependent. Hypersomnia with long sleep time.    How likely are you to doze in the following situations: 0 = not likely, 1 = slight chance, 2 = moderate chance, 3 = high chance   Sitting and Reading? Watching Television? Sitting inactive in a public place (theater or  meeting)? As a passenger in a car for an hour without a break? Lying down in the afternoon when circumstances permit? Sitting and talking to someone? Sitting quietly after lunch without alcohol? In a car, while stopped for a few minutes in traffic?   Total = 11/ 24 points   FSS endorsed at 58/ 63 points.   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    Drug use: No   Sexual activity: Not on file  Lifestyle   Physical activity    Days per week: Not on file    Minutes per session: Not on file   Stress: Not on file  Relationships   Social connections    Talks on phone: Not on file    Gets together: Not on file    Attends religious service: Not on file    Active member of club or organization: Not on file    Attends meetings of clubs or organizations: Not on file    Relationship status: Not on file  Other Topics Concern   Not on file  Social History Narrative   Patient lives at home with her husband Jori Moll).   Patient use to work as an Optometrist for 27 years but has not work since aneurysm bleed.     Patient is on disability since 2011 .    Patient has an Associates degree.     Patient is right-handed.   Patient drinks 2 cups of green tea per week.                Family History  Problem Relation Age of Onset   Heart disease Mother    Heart disease Father    Heart attack Father    Cancer Father        prostate to lymph node   Lung cancer Other    Breast cancer Neg Hx     Past Medical History:  Diagnosis Date   Abnormal gait    Amnesia    Anemia    hx   Aneurysm ([place])    Anxiety    Asthma    Atrophic vaginitis    Benign hypertensive heart disease    Cardiomegaly    Chest pain    COPD (chronic obstructive pulmonary disease) ([place])     - PFTs  01/24/06 FEV1 69% ratio 68% diffusing capacity 57% with no improvement after B2   - PFT's 10/1/ 09 FEV1 77   ratio  76    dlco                   45            no resp to B2    - Nl alpha one antitripsin level 10/09   Cough    Depression    Difficulty speaking    Disorder of nasal cavity    Dyspnea    Enthesopathy of elbow region    GERD (gastroesophageal reflux disease)    Hammer toe    Headache(784.0)    Hemiparesis affecting dominant side as late effect of cerebrovascular accident ([place])    History of kidney stones    Hyperlipidemia    Hypertension    Hypothyroidism    Increased urinary frequency    Low compliance bladder    Macular degeneration of both eyes    Malaise and fatigue    Nonruptured cerebral aneurysm    Obesity    Pneumonia    hx   Pure hypercholesterolemia    Recurrent major depressive episodes (HCC)    Seizure disorder, secondary ([place])    2-3 focal siezures monthly   Seizures ([place])    Short-term memory loss    since stroke   Skin sensation  disturbance    Stroke [facility]) 2011   rt sided weakness   Swelling of limb    TMJ (temporomandibular joint disorder)    Urinary tract infectious disease    Vertigo as late effect of stroke    Vitamin D deficiency     Past Surgical History:  Procedure Laterality Date   BACK SURGERY      CEREBRAL ANGIOGRAM     x3   COLONOSCOPY WITH PROPOFOL N/A 08/18/2016   Procedure: COLONOSCOPY WITH PROPOFOL;  Surgeon: Carol Ada, MD;  Location: [facility];  Service: Endoscopy;  Laterality: N/A;   LAPAROSCOPIC HYSTERECTOMY     MANDIBLE FRACTURE SURGERY     RADIOLOGY WITH ANESTHESIA N/A 08/21/2013   Procedure: RADIOLOGY WITH ANESTHESIA;  Surgeon: Rob Hickman, MD;  Location: [facility];  Service: Radiology;  Laterality: N/A;   stones     no surgery     Current Outpatient Medications on File Prior to Visit  Medication Sig Dispense Refill   albuterol (PROVENTIL HFA;VENTOLIN HFA) 108 (90 Base) MCG/ACT inhaler Inhale 2 puffs into the lungs every 6 (six) hours as needed for wheezing or shortness of breath. 1 Inhaler 2   ALPRAZolam (XANAX) 0.25 MG tablet Take 1 tablet (0.25 mg total) by mouth 2 (two) times daily. As needed 90 tablet 1   aspirin EC 81 MG tablet Take 81 mg by mouth daily.     B Complex-C (SUPER B COMPLEX PO) Take 1 tablet by mouth daily.     Cholecalciferol (VITAMIN D-3) 1000 units CAPS Take 1,000 Units by mouth daily.     citalopram (CELEXA) 40 MG tablet Take 1 tablet (40 mg total) by mouth at bedtime. 90 tablet 1   clopidogrel (PLAVIX) 75 MG tablet TAKE 1 TABLET DAILY 90 tablet 4   CRANBERRY PO Take 2 tablets by mouth 2 (two) times daily.     Cyanocobalamin (VITAMIN B-12 PO) Take 1,000 mcg by mouth every morning.     cyclobenzaprine (FLEXERIL) 10 MG tablet TAKE 1 TABLET AT BEDTIME AS NEEDED 90 tablet 3   diclofenac sodium (VOLTAREN) 1 % GEL Apply 2 g topically 3 (three) times daily as needed (for pain).      fluticasone (FLONASE) 50 MCG/ACT nasal spray Place 2 sprays into both nostrils daily as needed (allergies or post-nasl drip).   3   levETIRAcetam (KEPPRA) 500 MG tablet Take 1 tablet (500 mg total) by mouth every 12 (twelve) hours. 180 tablet 3   levothyroxine (SYNTHROID) 50 MCG tablet TAKE 1 TABLET ON MONDAY THROUGH WEDNESDAY 30 tablet 1    levothyroxine (SYNTHROID) 75 MCG tablet Take 75 mcg by mouth in the morning before breakfast on Thursday - Sunday , Take 50 mcg on other days 48 tablet 1   losartan (COZAAR) 100 MG tablet Take 100 mg by mouth every morning.      Omega-3 Fatty Acids (FISH OIL) 1000 MG CAPS Take 2,000 mg by mouth 2 (two) times daily.      rosuvastatin (CRESTOR) 10 MG tablet TAKE 1 TABLET DAILY 90 tablet 2   topiramate (TOPAMAX) 25 MG tablet TAKE 1 TABLET IN THE MORNING AND 2 TABLETS IN THE EVENING 270 tablet 3   tretinoin (RETIN-A) 0.05 % cream Apply to the skin nightly as directed  3   vitamin C (ASCORBIC ACID) 500 MG tablet Take 500 mg by mouth daily.     vitamin E 1000 UNIT capsule Take 1,000 Units by mouth at bedtime.     No current facility-administered  medications on file prior to visit.     Allergies  Allergen Reactions   Phenytoin Anaphylaxis, Hives and Swelling    Physical exam:  There were no vitals filed for this visit. There is no height or weight on file to calculate BMI.   Wt Readings from Last 3 Encounters:  06/11/19 168 lb (76.2 kg)  05/20/19 165 lb (74.8 kg)  03/25/19 166 lb 3.2 oz (75.4 kg)     Ht Readings from Last 3 Encounters:  06/11/19 5' 1.2" (1.554 m)  03/25/19 5\' 2"  (1.575 m)  09/18/18 5' 2.5" (1.588 m)      General: The patient is awake, alert and appears not in acute distress. The patient is well groomed. Head: Normocephalic, atraumatic.  Neck is supple. Mallampati 3,  neck circumference:14. 75 inches . Nasal airflow patent.  Retrognathia is not seen.  Dental status:  Cardiovascular:  Regular rate and cardiac rhythm by pulse,  without distended neck veins. Respiratory: Lungs are clear to auscultation.  Skin:  Without evidence of ankle edema, or rash. Trunk: The patient's posture is erect.   Neurologic exam : The patient is awake and alert, oriented to place and time.   Memory subjective described as intact.  Attention span & concentration ability appears  normal.  Speech is fluent,  without  dysarthria, dysphonia or aphasia.  Mood and affect are appropriate.   Cranial nerves: no loss of smell or taste reported  Pupils are not equal but briskly reactive to light. Both are round, the left is 2 mm larger than the right. Funduscopic exam: macular degeneration , wet on the right. .  Extraocular movements in vertical and horizontal planes were intact and without nystagmus.  No Diplopia. Visual fields by finger perimetry are intact. Hearing was intact to soft voice and finger rubbing.    Facial sensation  to fine touch. Right face is numb- more the cheek not split at  the midline.   Facial motor strength is asymmetric. 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  Neck ROM :  Restricted right tilt and rotation. But normal neck  flexion extension  shoulder shrug was weaker on the left / asymmetrical.    Motor exam:  right side elevated , tone and restricted  ROM. Spasticity .   Sensory:  Fine touch, pinprick and vibration were decreased on the right leg , leg always feels cold. Proprioception tested in the upper extremities was also affected.    Coordination: Rapid alternating movements in the right side fingers/hands were affected  The Finger-to-nose maneuver wa  with evidence of right sided  pronatordrift , mild ataxia, dysmetria and  tremor.   Gait and station: Patient could rise assisted with a 4 prong cane  from a seated position,     After spending a total time of  35  minutes face to face and additional time for physical and neurologic examination, review of laboratory studies,  personal review of imaging studies, reports and results of other testing and review of referral information / records as far as provided in visit, I have established the following assessments:  1)  The brain lesions related to aneurysm c bleed was affecting the frontal lobe on the left, and  would be atypical to induce hypersomnia. Sleepiness just started a month or two ago. She is not snoring -she reports vivid dreams.  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 Needing to establish routines and rituals . She needs to reduce naps, 30 minute power naps.  3) new shortness of breath, gasping and feeling more and more tired all day, sleep associated with headaches.    My Plan is to proceed with:  1) attended sleep study - will need a sleep time of midnight/ 2) check for PLMs.  3) hypoxemia related to COPD, reporting she  gasps for breath at night. Hypersomnia is often related to depression.    I would like to thank Antony Contras, MD and  Glendale Chard, MD for allowing me to meet with and to take care of this pleasant patient.   In short, [name] will undergo an attended sleep study , expanded EEG montage due to history of seizures.  I plan to follow up either personally or through our NP within 2 month.   CC: I will share my notes with Dr Leonie Man.   Electronically signed by: Larey Seat, MD 06/13/2019 12:37 PM  [facility] Sleep Board certified by The [employer] of Sleep Medicine and Diplomate of the [employer] of Sleep Medicine. Board certified In Neurology through the [place], Fellow of the [employer] of Neurology. Medical Director of [employer].

## 2019-06-13 NOTE — Patient Instructions (Signed)
Seizure, Adult A seizure is a sudden burst of abnormal electrical activity in the brain. Seizures usually last from 30 seconds to 2 minutes. They can cause many different symptoms. Usually, seizures are not harmful unless they last a long time. What are the causes? Common causes of this condition include:  Fever or infection.  Conditions that affect the brain, such as: ? A brain abnormality that you were born with. ? A brain or head injury. ? Bleeding in the brain. ? A tumor. ? Stroke. ? Brain disorders such as autism or cerebral palsy.  Low blood sugar.  Conditions that are passed from parent to child (are inherited).  Problems with substances, such as: ? Having a reaction to a drug or a medicine. ? Suddenly stopping the use of a substance (withdrawal). In some cases, the cause may not be known. A person who has repeated seizures over time without a clear cause has a condition called epilepsy. What increases the risk? You are more likely to get this condition if you have:  A family history of epilepsy.  Had a seizure in the past.  A brain disorder.  A history of head injury, lack of oxygen at birth, or strokes. What are the signs or symptoms? There are many types of seizures. The symptoms vary depending on the type of seizure you have. Examples of symptoms during a seizure include:  Shaking (convulsions).  Stiffness in the body.  Passing out (losing consciousness).  Head nodding.  Staring.  Not responding to sound or touch.  Loss of bladder control and bowel control. Some people have symptoms right before and right after a seizure happens. Symptoms before a seizure may include:  Fear.  Worry (anxiety).  Feeling like you may vomit (nauseous).  Feeling like the room is spinning (vertigo).  Feeling like you saw or heard something before (dj vu).  Odd tastes or smells.  Changes in how you see. You may see flashing lights or spots. Symptoms after a  seizure happens can include:  Confusion.  Sleepiness.  Headache.  Weakness on one side of the body. How is this treated? Most seizures will stop on their own in under 5 minutes. In these cases, no treatment is needed. Seizures that last longer than 5 minutes will usually need treatment. Treatment can include:  Medicines given through an IV tube.  Avoiding things that are known to cause your seizures. These can include medicines that you take for another condition.  Medicines to treat epilepsy.  Surgery to stop the seizures. This may be needed if medicines do not help. Follow these instructions at home: Medicines  Take over-the-counter and prescription medicines only as told by your doctor.  Do not eat or drink anything that may keep your medicine from working, such as alcohol. Activity  Do not do any activities that would be dangerous if you had another seizure, like driving or swimming. Wait until your doctor says it is safe for you to do them.  If you live in the [place] ask your local DMV (department of motor vehicles) when you can drive.  Get plenty of rest. Teaching others Teach friends and family what to do when you have a seizure. They should:  Lay you on the ground.  Protect your head and body.  Loosen any tight clothing around your neck.  Turn you on your side.  Not hold you down.  Not put anything into your mouth.  Know whether or not you need emergency care.  Stay   with you until you are better.  General instructions  Contact your doctor each time you have a seizure.  Avoid anything that gives you seizures.  Keep a seizure diary. Write down: ? What you think caused each seizure. ? What you remember about each seizure.  Keep all follow-up visits as told by your doctor. This is important. Contact a doctor if:  You have another seizure.  You have seizures more often.  There is any change in what happens during your seizures.  You keep having  seizures with treatment.  You have symptoms of being sick or having an infection. Get help right away if:  You have a seizure that: ? Lasts longer than 5 minutes. ? Is different than seizures you had before. ? Makes it harder to breathe. ? Happens after you hurt your head.  You have any of these symptoms after a seizure: ? Not being able to speak. ? Not being able to use a part of your body. ? Confusion. ? A bad headache.  You have two or more seizures in a row.  You do not wake up right after a seizure.  You get hurt during a seizure. These symptoms may be an emergency. Do not wait to see if the symptoms will go away. Get medical help right away. Call your local emergency services (911 in the [place] Do not drive yourself to the hospital. Summary  Seizures usually last from 30 seconds to 2 minutes. Usually, they are not harmful unless they last a long time.  Do not eat or drink anything that may keep your medicine from working, such as alcohol.  Teach friends and family what to do when you have a seizure.  Contact your doctor each time you have a seizure. This information is not intended to replace advice given to you by your health care provider. Make sure you discuss any questions you have with your health care provider. Document Released: 02/08/2008 Document Revised: 11/09/2018 Document Reviewed: 11/09/2018 Elsevier Patient Education  2020 [place] for Sleep Apnea  Sleep apnea is a condition in which breathing pauses or becomes shallow during sleep. Sleep apnea screening is a test to determine if you are at risk for sleep apnea. The test is easy and only takes a few minutes. Your health care provider may ask you to have this test in preparation for surgery or as part of a physical exam. What are the symptoms of sleep apnea? Common symptoms of sleep apnea include:  Snoring.  Restless sleep.  Daytime sleepiness.  Pauses in breathing.  Choking during  sleep.  Irritability.  Forgetfulness.  Trouble thinking clearly.  Depression.  Personality changes. Most people with sleep apnea are not aware that they have it. Why should I get screened? Getting screened for sleep apnea can help:  Ensure your safety. It is important for your health care providers to know whether or not you have sleep apnea, especially if you are having surgery or have other long-term (chronic) health conditions.  Improve your health and allow you to get a better night's rest. Restful sleep can help you: ? Have more energy. ? Lose weight. ? Improve high blood pressure. ? Improve diabetes management. ? Prevent stroke. ? Prevent car accidents. How is screening done? Screening usually includes being asked a list of questions about your sleep quality. Some questions you may be asked include:  Do you snore?  Is your sleep restless?  Do you have daytime sleepiness?  Has a partner  or spouse told you that you stop breathing during sleep?  Have you had trouble concentrating or memory loss? If your screening test is positive, you are at risk for the condition. Further testing may be needed to confirm a diagnosis of sleep apnea. Where to find more information You can find screening tools online or at your health care clinic. For more information about sleep apnea screening and healthy sleep, visit these websites:  Centers for Disease Control and Prevention: [employer]pl  American Sleep Apnea Association: www.sleepapnea.org Contact a health care provider if:  You think that you may have sleep apnea. Summary  Sleep apnea screening can help determine if you are at risk for sleep apnea.  It is important for your health care providers to know whether or not you have sleep apnea, especially if you are having surgery or have other chronic health conditions.  You may be asked to take a screening test for sleep apnea in preparation for surgery or as  part of a physical exam. This information is not intended to replace advice given to you by your health care provider. Make sure you discuss any questions you have with your health care provider. Document Released: 12/02/2016 Document Revised: 06/08/2018 Document Reviewed: 12/02/2016 Elsevier Patient Education  2020 [employer].

## 2019-06-25 DIAGNOSIS — H353122 Nonexudative age-related macular degeneration, left eye, intermediate dry stage: Secondary | ICD-10-CM | POA: Diagnosis not present

## 2019-06-25 DIAGNOSIS — H353211 Exudative age-related macular degeneration, right eye, with active choroidal neovascularization: Secondary | ICD-10-CM | POA: Diagnosis not present

## 2019-06-26 ENCOUNTER — Telehealth: Payer: Medicare Other

## 2019-07-01 ENCOUNTER — Other Ambulatory Visit: Payer: Self-pay

## 2019-07-01 DIAGNOSIS — E039 Hypothyroidism, unspecified: Secondary | ICD-10-CM

## 2019-07-01 MED ORDER — LEVOTHYROXINE SODIUM 75 MCG PO TABS: ORAL_TABLET | ORAL | 1 refills | Status: DC

## 2019-07-03 ENCOUNTER — Ambulatory Visit: Payer: Medicare Other | Admitting: Internal Medicine

## 2019-07-04 ENCOUNTER — Ambulatory Visit (INDEPENDENT_AMBULATORY_CARE_PROVIDER_SITE_OTHER): Payer: Medicare Other | Admitting: Adult Health

## 2019-07-04 ENCOUNTER — Encounter: Payer: Self-pay | Admitting: Adult Health

## 2019-07-04 ENCOUNTER — Other Ambulatory Visit: Payer: Self-pay

## 2019-07-04 VITALS — BP 117/64 | HR 104 | Ht 62.0 in | Wt 168.0 lb

## 2019-07-04 DIAGNOSIS — F331 Major depressive disorder, recurrent, moderate: Secondary | ICD-10-CM | POA: Diagnosis not present

## 2019-07-04 DIAGNOSIS — F411 Generalized anxiety disorder: Secondary | ICD-10-CM | POA: Diagnosis not present

## 2019-07-04 NOTE — Progress Notes (Signed)
[facility] MD/PA/NP Initial Note  07/04/2019 2:51 PM [name]  MRN:  [id number]  Chief Complaint:   HPI:   Referred by Dr. Baird Cancer x 20 years   Describes mood today as "so-so". Pleasant. Mood symptoms - reports depression, anxiety, and irritability. Stating "I've been this way for a lon, long time, I just don't say that much about". Has a lot of issues with husband and daughter - stating "it may be my fault, I don't know". Stating "I can't talk to my husband". Also stating "he's not a giver or great listener". Would like to work with a therapist before adjusting or adding medication. Stable interest and motivation. Taking medications as prescribed.  Energy levels lower. Active, does not have a regular exercise routine. Disabled. Enjoys some usual interests and activities. Married. Lives husband of 30 years. 2 children - son and daughter - 74  Grandchildren. Spending time with family. Reading. Playing on computer. Word finding. TV.  Appetite adequate. Weight gain.  Sleeps well most nights. Averages 8 to 10 hours. Napping during the day. Stating "I may sleep too much". Focus and concentration difficulties - more so since 2 hemorraghic strokes (had 2 on the same day). Completing tasks. Managing some aspects of household - "mostly nothing".  Denies SI or HI. Denies AH or VH.  Visit Diagnosis:    ICD-10-CM   1. Major depressive disorder, recurrent episode, moderate (HCC)  F33.1   2. Generalized anxiety disorder  F41.1     Past Psychiatric History: Denies  Past Medical History:  Past Medical History:  Diagnosis Date  . Abnormal gait   . Amnesia   . Anemia    hx  . Aneurysm ([place])   . Anxiety   . Asthma   . Atrophic vaginitis   . Benign hypertensive heart disease   . Cardiomegaly   . Chest pain   . COPD (chronic obstructive pulmonary disease) ([place])     - PFTs  01/24/06 FEV1 69% ratio 68% diffusing capacity 57% with no improvement after B2   - PFT's 10/1/ 09 FEV1 77   ratio   76    dlco                   45            no resp to B2    - Nl alpha one antitripsin level 10/09  . Cough   . Depression   . Difficulty speaking   . Disorder of nasal cavity   . Dyspnea   . Enthesopathy of elbow region   . GERD (gastroesophageal reflux disease)   . Hammer toe   . Headache(784.0)   . Hemiparesis affecting dominant side as late effect of cerebrovascular accident ([place])   . History of kidney stones   . Hyperlipidemia   . Hypertension   . Hypothyroidism   . Increased urinary frequency   . Low compliance bladder   . Macular degeneration of both eyes   . Malaise and fatigue   . Nonruptured cerebral aneurysm   . Obesity   . Pneumonia    hx  . Pure hypercholesterolemia   . Recurrent major depressive episodes ([place])   . Seizure disorder, secondary ([place])    2-3 focal siezures monthly  . Seizures ([place])   . Short-term memory loss    since stroke  . Skin sensation disturbance   . Stroke [facility]) 2011   rt sided weakness  . Swelling of limb   . TMJ (  temporomandibular joint disorder)   . Urinary tract infectious disease   . Vertigo as late effect of stroke   . Vitamin D deficiency     Past Surgical History:  Procedure Laterality Date  . BACK SURGERY    . CEREBRAL ANGIOGRAM     x3  . COLONOSCOPY WITH PROPOFOL N/A 08/18/2016   Procedure: COLONOSCOPY WITH PROPOFOL;  Surgeon: Carol Ada, MD;  Location: [facility];  Service: Endoscopy;  Laterality: N/A;  . LAPAROSCOPIC HYSTERECTOMY    . MANDIBLE FRACTURE SURGERY    . RADIOLOGY WITH ANESTHESIA N/A 08/21/2013   Procedure: RADIOLOGY WITH ANESTHESIA;  Surgeon: Rob Hickman, MD;  Location: [facility];  Service: Radiology;  Laterality: N/A;  . stones     no surgery    Family Psychiatric History: Denies  Family History:  Family History  Problem Relation Age of Onset  . Heart disease Mother   . Heart disease Father   . Heart attack Father   . Cancer Father        prostate to lymph node  . Lung cancer Other    . Breast cancer Neg Hx     Social History:  Social History   Socioeconomic History  . Marital status: Married    Spouse name: Jori Moll  . Number of children: 2  . Years of education: 48  . Highest education level: Not on file  Occupational History  . 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 Financial resource strain: Not on file  . Food insecurity    Worry: Not on file    Inability: Not on file  . Transportation needs    Medical: Not on file    Non-medical: Not on file  Tobacco Use  . Smoking status: Former Smoker    Packs/day: 2.00    Types: Cigarettes    Quit date: 11/27/2009    Years since quitting: 9.6  . Smokeless tobacco: Never Used  . Tobacco comment: smoked 2.5 ppd x 5 years, 1ppd x 20+ years  Substance and Sexual Activity  . Alcohol use: No    Alcohol/week: 0.0 standard drinks    Comment: patient quit alcohol use 2005.   . Drug use: No  . Sexual activity: Not on file  Lifestyle  . Physical activity    Days per week: Not on file    Minutes per session: Not on file  . Stress: Not on file  Relationships  . Social Herbalist on phone: Not on file    Gets together: Not on file    Attends religious service: Not on file    Active member of club or organization: Not on file    Attends meetings of clubs or organizations: Not on file    Relationship status: Not on file  Other Topics Concern  . Not on file  Social History Narrative   Patient lives at home with her husband Jori Moll).   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    Patient is on disability.    Patient has an Associates degree.   Patient is right-handed.   Patient drinks 2 cups of green tea most days.                Allergies:  Allergies  Allergen Reactions  . Phenytoin Anaphylaxis, Hives and Swelling    Metabolic Disorder Labs: Lab Results  Component Value Date  HGBA1C 5.9 (H) 06/11/2019    No results found for: PROLACTIN Lab Results  Component Value Date   CHOL  11/23/2009    166        ATP III CLASSIFICATION:  <200     mg/dL   Desirable  200-239  mg/dL   Borderline High  >=240    mg/dL   High          TRIG 76 11/23/2009   HDL 60 11/23/2009   CHOLHDL 2.8 11/23/2009   VLDL 15 11/23/2009   LDLCALC  11/23/2009    91        Total Cholesterol/HDL:CHD Risk Coronary Heart Disease Risk Table                     Men   Women  1/2 Average Risk   3.4   3.3  Average Risk       5.0   4.4  2 X Average Risk   9.6   7.1  3 X Average Risk  23.4   11.0        Use the calculated Patient Ratio above and the CHD Risk Table to determine the patient's CHD Risk.        ATP III CLASSIFICATION (LDL):  <100     mg/dL   Optimal  100-129  mg/dL   Near or Above                    Optimal  130-159  mg/dL   Borderline  160-189  mg/dL   High  >190     mg/dL   Very High   Lab Results  Component Value Date   TSH 0.489 06/11/2019   TSH 0.407 (L) 01/02/2019    Therapeutic Level Labs: No results found for: LITHIUM No results found for: VALPROATE No components found for:  CBMZ  Current Medications: Current Outpatient Medications  Medication Sig Dispense Refill  . albuterol (PROVENTIL HFA;VENTOLIN HFA) 108 (90 Base) MCG/ACT inhaler Inhale 2 puffs into the lungs every 6 (six) hours as needed for wheezing or shortness of breath. 1 Inhaler 2  . ALPRAZolam (XANAX) 0.25 MG tablet Take 1 tablet (0.25 mg total) by mouth 2 (two) times daily. As needed 90 tablet 1  . aspirin EC 81 MG tablet Take 81 mg by mouth daily.    . Cholecalciferol (VITAMIN D-3) 1000 units CAPS Take 1,000 Units by mouth daily.    . citalopram (CELEXA) 40 MG tablet Take 1 tablet (40 mg total) by mouth at bedtime. 90 tablet 1  . clopidogrel (PLAVIX) 75 MG tablet TAKE 1 TABLET DAILY 90 tablet 4  . CRANBERRY PO Take 2 tablets by mouth 2 (two) times daily.    . cyclobenzaprine (FLEXERIL) 10 MG tablet TAKE 1 TABLET AT  BEDTIME AS NEEDED 90 tablet 3  . diclofenac sodium (VOLTAREN) 1 % GEL Apply 2 g topically 3 (three) times daily as needed (for pain).     . fluticasone (FLONASE) 50 MCG/ACT nasal spray Place 2 sprays into both nostrils daily as needed (allergies or post-nasl drip).   3  . levETIRAcetam (KEPPRA) 500 MG tablet Take 1 tablet (500 mg total) by mouth every 12 (twelve) hours. 180 tablet 3  . levothyroxine (SYNTHROID) 50 MCG tablet TAKE 1 TABLET ON MONDAY THROUGH WEDNESDAY 30 tablet 1  . levothyroxine (SYNTHROID) 75 MCG tablet Take 75 mcg by mouth in the morning before breakfast on Thursday - Sunday , Take 50 mcg on  other days 48 tablet 1  . losartan (COZAAR) 100 MG tablet Take 100 mg by mouth every morning.     . Omega-3 Fatty Acids (FISH OIL) 1000 MG CAPS Take 2,000 mg by mouth 2 (two) times daily.     . rosuvastatin (CRESTOR) 10 MG tablet TAKE 1 TABLET DAILY 90 tablet 2  . topiramate (TOPAMAX) 25 MG tablet TAKE 1 TABLET IN THE MORNING AND 2 TABLETS IN THE EVENING 270 tablet 3  . vitamin C (ASCORBIC ACID) 500 MG tablet Take 500 mg by mouth daily.    . vitamin E 1000 UNIT capsule Take 1,000 Units by mouth at bedtime.     No current facility-administered medications for this visit.     Medication Side Effects: none  Orders placed this visit:  No orders of the defined types were placed in this encounter.   Psychiatric Specialty Exam:  ROS  Blood pressure 117/64, pulse (!) 104, height 5\' 2"  (1.575 m), weight 168 lb (76.2 kg).Body mass index is 30.73 kg/m.  General Appearance: Neat and Well Groomed  Eye Contact:  Good  Speech:  Clear and Coherent  Volume:  Normal  Mood:  Anxious and Depressed  Affect:  Congruent  Thought Process:  Coherent  Orientation:  Full (Time, Place, and Person)  Thought Content: Logical   Suicidal Thoughts:  No  Homicidal Thoughts:  No  Memory:  WNL  Judgement:  Good  Insight:  Good  Psychomotor Activity:  Normal  Concentration:  Concentration: Good  Recall:   Good  Fund of Knowledge: Good  Language: Good  Assets:  Communication Skills Desire for Improvement Financial Resources/Insurance Housing Intimacy Leisure Time Physical Health Resilience Social Support Talents/Skills Transportation Vocational/Educational  ADL's:  Intact  Cognition: WNL  Prognosis:  Good   Screenings:  Mini-Mental     Clinical Support from 09/03/2018 in [facility] Office Visit from 11/03/2015 in [facility] Office Visit from 05/05/2015 in [facility] Office Visit from 11/05/2014 in [facility]  Total Score (max 30 points )  29  27  26  28     PHQ2-9     Office Visit from 06/11/2019 in [facility] from 09/03/2018 in [facility]  PHQ-2 Total Score  0  0      Receiving Psychotherapy: No   Treatment Plan/Recommendations:   Plan:  1. Celexa 40mg  daily 2. Xanax 0.25mg  BID 3. Refer to therapist  No medications this visit  RTC 4 weeks  Patient advised to contact office with any questions, adverse effects, or acute worsening in signs and symptoms.    Aloha Gell, NP

## 2019-07-17 ENCOUNTER — Ambulatory Visit (INDEPENDENT_AMBULATORY_CARE_PROVIDER_SITE_OTHER): Payer: Medicare Other | Admitting: Neurology

## 2019-07-17 ENCOUNTER — Other Ambulatory Visit: Payer: Self-pay

## 2019-07-17 DIAGNOSIS — J432 Centrilobular emphysema: Secondary | ICD-10-CM

## 2019-07-17 DIAGNOSIS — G4710 Hypersomnia, unspecified: Secondary | ICD-10-CM | POA: Diagnosis not present

## 2019-07-17 DIAGNOSIS — G478 Other sleep disorders: Secondary | ICD-10-CM

## 2019-07-17 DIAGNOSIS — G4719 Other hypersomnia: Secondary | ICD-10-CM

## 2019-07-17 DIAGNOSIS — I69351 Hemiplegia and hemiparesis following cerebral infarction affecting right dominant side: Secondary | ICD-10-CM

## 2019-07-17 DIAGNOSIS — G253 Myoclonus: Secondary | ICD-10-CM

## 2019-07-17 DIAGNOSIS — G40909 Epilepsy, unspecified, not intractable, without status epilepticus: Secondary | ICD-10-CM

## 2019-07-17 DIAGNOSIS — I671 Cerebral aneurysm, nonruptured: Secondary | ICD-10-CM

## 2019-07-19 ENCOUNTER — Ambulatory Visit (INDEPENDENT_AMBULATORY_CARE_PROVIDER_SITE_OTHER): Payer: Medicare Other | Admitting: Psychiatry

## 2019-07-19 ENCOUNTER — Other Ambulatory Visit: Payer: Self-pay

## 2019-07-19 DIAGNOSIS — F331 Major depressive disorder, recurrent, moderate: Secondary | ICD-10-CM | POA: Diagnosis not present

## 2019-07-19 DIAGNOSIS — Z62820 Parent-biological child conflict: Secondary | ICD-10-CM | POA: Diagnosis not present

## 2019-07-19 DIAGNOSIS — Z630 Problems in relationship with spouse or partner: Secondary | ICD-10-CM | POA: Diagnosis not present

## 2019-07-19 DIAGNOSIS — I6930 Unspecified sequelae of cerebral infarction: Secondary | ICD-10-CM

## 2019-07-19 DIAGNOSIS — F411 Generalized anxiety disorder: Secondary | ICD-10-CM | POA: Diagnosis not present
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
## 2019-07-23 DIAGNOSIS — H353211 Exudative age-related macular degeneration, right eye, with active choroidal neovascularization: Secondary | ICD-10-CM | POA: Diagnosis not present

## 2019-07-23 DIAGNOSIS — H353122 Nonexudative age-related macular degeneration, left eye, intermediate dry stage: Secondary | ICD-10-CM | POA: Diagnosis not present

## 2019-07-23 DIAGNOSIS — H35033 Hypertensive retinopathy, bilateral: Secondary | ICD-10-CM | POA: Diagnosis not present

## 2019-07-23 DIAGNOSIS — H2513 Age-related nuclear cataract, bilateral: Secondary | ICD-10-CM | POA: Diagnosis not present

## 2019-07-25 NOTE — Procedures (Signed)
PATIENT'S NAME:  [name], [name] DOB:      1956-06-02      MR#:    [id number]     DATE OF RECORDING: 07/17/2019 REFERRING M.D.:  Antony Contras, MD and  PCP : Glendale Chard, MD Study Performed:   Expanded EEG montage - Polysomnogram HISTORY:  I have the pleasure of seeing [name] on 06-13-2019, a right -handed Caucasian female with a possible sleep disorder.  She  has a  medical history of CNS related abnormal gait, Anemia, cerebral Aneurysm bleed, Asthma, Aphasia,  Hypertensive heart disease with Cardiomegaly, Chest pain, COPD (chronic obstructive pulmonary disease), tobacco use (HCC), Cough, Disorder of nasal cavity, Dyspnea, GERD (gastroesophageal reflux disease), Headache(784.0), Hemiparesis affecting dominant side as late effect of cerebrovascular accident [facility]), History of kidney stones, Hyperlipidemia, Hypertension, controlled Hypothyroidism, Increased urinary frequency, Low compliance bladder, Macular degeneration of both eyes, Malaise and fatigue, Non-ruptured cerebral aneurysm, Obesity, Pneumonia, Pure hypercholesterolemia, Recurrent major depressive episodes ([place]), Seizure disorder, secondary ([place]), Short-term memory loss, Skin sensation disturbance, Stroke ([place]) (2011), TMJ (temporomandibular joint disorder), Urinary tract infectious disease, Vertigo as late effect of stroke, and Vitamin D deficiency.  Patient uses a 4 prong cane and has aphasia, acalculia and excessive daytime sleepiness. she had a sleep study in 2009 , long before her aneurysm bleed.  The patient endorsed the Epworth Sleepiness Scale at 11 points. FSS at 58/63 points.  The patient's weight 166 pounds with a height of 61.5 (inches), resulting in a BMI of 31.2 kg/m2. The patient's neck circumference measured 14.8 inches.  CURRENT MEDICATIONS: Ventolin, Xanax, ASA 81mg , B-Complex, Vit D-3, Celexa, Plavix, Cranberry, Flexeril, Voltaren, Flonase, Keppra, Synthroid, Cozaar, Fish Oil, Crestor, Topamax, Vit C, Vit  E   PROCEDURE:  This is a multichannel digital polysomnogram utilizing the Somnostar 11.2 system.  Electrodes and sensors were applied and monitored per AASM Specifications.   EEG, EOG, Chin and Limb EMG, were sampled at 200 Hz.  ECG, Snore and Nasal Pressure, Thermal Airflow, Respiratory Effort, CPAP Flow and Pressure, Oximetry was sampled at 50 Hz. Digital video and audio were recorded.      BASELINE STUDY: Lights Out was at 22:21 and Lights On at 05:00.  Total recording time (TRT) was 399.5 minutes, with a total sleep time (TST) of 342.5 minutes.   The patient's sleep latency was 24.5 minutes.  REM latency was 286 minutes.  The sleep efficiency was 85.7 %.     SLEEP ARCHITECTURE: WASO (Wake after sleep onset) was 42 minutes.  There were 16.5 minutes in Stage N1, 213.5 minutes Stage N2, 69.5 minutes Stage N3 and 43 minutes in Stage REM.  The percentage of Stage N1 was 4.8%, Stage N2 was 62.3%, Stage N3 was 20.3% and Stage R (REM sleep) was 12.6%.   RESPIRATORY ANALYSIS:  There were a total of 12 respiratory events:  0 obstructive apneas, 0 central apneas and 0 mixed apneas with a total of 0 apneas and an apnea index (AI) of 0 /hour. There were 12 hypopneas with a hypopnea index of 2.1 /hour. The patient also had 0 respiratory event related arousals (RERAs).      The total APNEA/HYPOPNEA INDEX (AHI) was 2.1 /hour and the total RESPIRATORY DISTURBANCE INDEX was 2.1 /hour.  2 events occurred in REM sleep and 20 events in NREM.  The REM AHI was 2.8 /hour, versus a non-REM AHI of 2.0/h. The patient spent 0 minutes of total sleep time in the supine position and 343 minutes in non-supine. The supine  AHI was 0.0 versus a non-supine AHI of 2.1/h. OXYGEN SATURATION & C02:  The Wake baseline 02 saturation was 89%, with the lowest being 84%. Time spent below 89% saturation equaled 49 minutes.  The patient had a total of 244 Periodic Limb Movements.  The Periodic Limb Movement (PLM) index was 42.7 and the PLM  Arousal index was 0.2/hour. The arousals were noted as: 16 were spontaneous, 1 was associated with PLM, 7 were associated with respiratory events. Audio and video analysis did not show any abnormal or unusual movements, behaviors, phonations or vocalizations. Snoring was not noted. EKG was in keeping with normal sinus rhythm (NSR). EEG was slowed, altered due to lesion related to CNS bleed.  Post-study, the patient indicated that sleep was of good quality.   IMPRESSION:  1. Insignificant Sleep Apnea, this very mild sleep disordered breathing was not clinically relevant and not able to explain fatigue or sleepiness.  2. Hypoxemia was present for 49 minutes in a total sleep time of 342 minutes, but was not related to OSA or sleep apnea in any form.  3. Periodic Limb Movement Disorder (PLMD), mild overall- but clustered while patient slept on her right side. She may have to avoid sleeping in the right lateral position of use a pillow between her legs.  4. No Primary Snoring 5. Normal EKG Abnormal sleep EEG due to lesion, possible focus for seizures.   RECOMMENDATIONS: No need for intervention. The patient had borderline hypoxemia but reported she no longer uses oxygen at home. The new Medicare guidelines would not likely qualify her for oxygen use (She will need to address with her pulmonologist or PCP). Hypoxemia without corresponding apnea may increase the level of fatigue in this patient.  PLMs were position dependent, therefor no medication is needed. I recommend to avoid right lateral sleep.   I certify that I have reviewed the entire raw data recording prior to the issuance of this report in accordance with the Standards of Accreditation of the American Academy of Sleep Medicine (AASM)  Larey Seat, MD  07-25-2019 Diplomat, American Board of Psychiatry and Neurology  Diplomat, American Board of [place] Director, [place] [facility] [employer]

## 2019-07-29 ENCOUNTER — Telehealth: Payer: Self-pay | Admitting: Neurology

## 2019-07-29 NOTE — Telephone Encounter (Signed)
Called the pt back and advised her of the sleep study results. Advised that based off the sleep study there was no significant sleep apnea that was concerning. Advised that she did have hypoxemia concerns. Oxygen level was below 89% for about 49 min total throughout the study. Advised the pt she also had some mild PLM's but this seemed to only be concerning when the patient was laying on her right side. Advised that Dr Brett Fairy would recommend the patient attempt laying on her left side and placing a pillow in between legs to help with sleep. Advised that I would send this information to her PCP so they could help send a referral to pulmonologist to address concerns with the hypoxemia. Pt states that previously she has been on oxygen before and I informed the patient that insurance wont allow neurology to order this for her. Informed her that I would make sure her PCP is routed in he results and request a pulmonology referral be requested for her. Patient was appreciative for the call and verbalized understanding of the results.

## 2019-07-29 NOTE — Telephone Encounter (Signed)
Called patient to discuss sleep study results. No answer at this time. LVM for the patient to call back.   

## 2019-07-29 NOTE — Telephone Encounter (Signed)
-----   Message from Larey Seat, MD sent at 07/25/2019 12:31 PM EST ----- IMPRESSION:   1. Insignificant Sleep Apnea, this very mild sleep disordered  breathing was not clinically relevant and not able to explain  fatigue or sleepiness.  2. Hypoxemia was present for 49 minutes in a total sleep time of  342 minutes, but was not related to OSA or sleep apnea in any  form.  3. Periodic Limb Movement Disorder (PLMD), mild overall- but  clustered while patient slept on her right side. She may have to  avoid sleeping in the right lateral position of use a pillow  between her legs.  4. No Primary Snoring  5. Normal EKG  Abnormal sleep EEG due to lesion, possible focus for seizures.   RECOMMENDATIONS: No need for intervention. The patient had  borderline hypoxemia but reported she no longer uses oxygen at  home. The new Medicare guidelines would not likely qualify her  for oxygen use (She will need to address with her pulmonologist  or PCP). Hypoxemia without corresponding apnea may increase the  level of fatigue in this patient.  PLMs were position dependent, therefor no medication is needed. I  recommend to avoid right lateral sleep.

## 2019-07-29 NOTE — Telephone Encounter (Signed)
Patient returned missed call for her sleep study results. Please follow up

## 2019-07-30 ENCOUNTER — Telehealth: Payer: Self-pay

## 2019-07-30 ENCOUNTER — Other Ambulatory Visit: Payer: Self-pay | Admitting: Internal Medicine

## 2019-07-30 DIAGNOSIS — G4734 Idiopathic sleep related nonobstructive alveolar hypoventilation: Secondary | ICD-10-CM

## 2019-07-30 NOTE — Telephone Encounter (Signed)
Stopped by today for B-12 lipo stated that she had her sleep study done and was told she needed a referral for Oxygen and a nebulizer machine.

## 2019-08-06 ENCOUNTER — Telehealth: Payer: Self-pay

## 2019-08-12 ENCOUNTER — Institutional Professional Consult (permissible substitution): Payer: Medicare Other | Admitting: Pulmonary Disease

## 2019-08-12 ENCOUNTER — Telehealth: Payer: Self-pay | Admitting: Pulmonary Disease

## 2019-08-12 NOTE — Telephone Encounter (Signed)
Pt called back to let us know that the school told grandchildren's mother that no covid testing needed unless showing symptoms.  They have not shown any symptoms from Thurs to today.  Grandchildren's mother got call Friday night.  Please advise for pt to come in today for 4:15 appt changed from 2:30.
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
## 2019-08-13 NOTE — Telephone Encounter (Signed)
Pt did not come in for an appt yesterday 12/7.

## 2019-08-14 ENCOUNTER — Ambulatory Visit (INDEPENDENT_AMBULATORY_CARE_PROVIDER_SITE_OTHER): Payer: Medicare Other | Admitting: Psychiatry

## 2019-08-14 ENCOUNTER — Other Ambulatory Visit: Payer: Self-pay

## 2019-08-14 DIAGNOSIS — Z62820 Parent-biological child conflict: Secondary | ICD-10-CM | POA: Diagnosis not present

## 2019-08-14 DIAGNOSIS — F331 Major depressive disorder, recurrent, moderate: Secondary | ICD-10-CM | POA: Diagnosis not present

## 2019-08-14 DIAGNOSIS — F411 Generalized anxiety disorder: Secondary | ICD-10-CM

## 2019-08-14 DIAGNOSIS — I6930 Unspecified sequelae of cerebral infarction: Secondary | ICD-10-CM

## 2019-08-14 DIAGNOSIS — Z630 Problems in relationship with spouse or partner: Secondary | ICD-10-CM

## 2019-08-14 MED ORDER — ROSUVASTATIN CALCIUM 10 MG PO TABS: 10.0000 mg | ORAL_TABLET | Freq: Every day | ORAL | 2 refills | Status: DC

## 2019-08-14 NOTE — Progress Notes (Signed)
Psychotherapy Progress Note [facility] [name], PhD LP  Patient ID: [name] Collie Siad")  MRN: [id number]     Therapy format: Individual psychotherapy Date: 08/14/2019      Start: 11:21a      Stop: 12.10p     Time Spent: 49 min Location: Telehealth visit -- I connected with this patient by an approved telecommunication method (audio only), with her informed consent, and verifying identity and patient privacy.  I was located at my office and patient at her home.  As needed, we discussed the limitations, risks, and security and privacy concerns associated with telehealth service, including the availability and conditions which currently govern in-person appointments and the possibility that 3rd-party payment may not be fully guaranteed and she may be responsible for charges.  After she indicated understanding, we proceeded with the session.  Also discussed treatment planning, as needed, including ongoing verbal agreement with the plan, the opportunity to ask and answer all questions, her demonstrated understanding of instructions, and her readiness to call the office should symptoms worsen or she feels she is in a crisis state and needs more immediate and tangible assistance.  Session narrative (presenting needs, interim history, self-report of stressors and symptoms, applications of prior therapy, status changes, and interventions made in session) Quarantined right now due to secondary exposure to COVID through the grandchildren.    Been faring better since initial session, a lot of weight lifted just sharing her troubles and getting a couple ideas for handling mainly family stresses.  Has felt more empowered with daughter to not let herself get upset or swallow it when [place] flies negative.  Put a stop to some guilt-mongering talk, just telling her she didn't want to hear it, and Adonis Brook has been nicer lately.  Has not seen cause to use the line "What do you need?"  but has called her attention to yelling and asked her to calm and call back.  Ongoing struggle to get the kids cared for and adequately supervised in learning, and certainly ongoing pain of having daughter relate to her less like a mother than an inconvenience.  Validated, framing the relationship more as what would have happened had PT actually been the stepmother in a joint custody situation.    Knows with her CVA (2011), she can get agitated easily, and more easily to have feeling hurt.  Medication helps with emotional brittleness.  Discussed possibility of keeping a journal again, both for handwriting and for better recording positive developments and behavioral accomplishments.    Would like to gear up to approach mother sometime, to tell her what it meant to co-opt her bonding with Adonis Brook.  Told her son about it at TG and he reached out compassionately, said "That explains a lot", gave her a hug.  Encouraged willingness to follow up with him, for learning and intimacy's sake.  Discussed possibilities for talking to mother about it, noting how it would need for her to be explicitly ready to hear bad news, and how she really needs to not put it in her space now.  Offered possibilities of writing mother the letter that goes unsent, pointing out how mother's actions harmed her and her relationship with Adonis Brook.  Also a letter that goes through her own regrets as a mother.    Has done some matter-of-fact handling of husband's irritability about her memory lapses, saying "That's the stroke, I have no control over that."  Also explaining how she plays memory games trying to  work her memory ability.    Therapeutic modalities: Cognitive Behavioral Therapy and Solution-Oriented/Positive Psychology  Mental Status/Observations:  Appearance:   Not assessed     Behavior:  Appropriate  Motor:  Not assessed  Speech/Language:   Clear and Coherent -- 1 error saying "one week" when meant "one month"  Affect:   Not assessed  Mood:  normal  Thought process:  normal  Thought content:    WNL  Sensory/Perceptual disturbances:    WNL  Orientation:  Fully oriented  Attention:  Good  Concentration:  Good  Memory:  WNL  Insight:    Good  Judgment:   Good  Impulse Control:  Good   Risk Assessment: Danger to Self: No Self-injurious Behavior: No Danger to Others: No Physical Aggression / Violence: No Duty to Warn: No Access to Firearms a concern: No  Assessment of progress:  progressing well  Diagnosis:   ICD-10-CM   1. Major depressive disorder, recurrent episode, moderate (HCC)  F33.1    improving  2. Generalized anxiety disorder  F41.1   3. Relationship problem between partners  Z63.0   4. Relationship problem between parent and child  Z62.820   5. History of stroke with residual effects  I69.30     Plan: . Acknowledgment and accomplishment journal . Option to write confrontation letter to mother (un-sent) and/or empty-chair conversation as a stand-in for confronting her . Option to write self an apology for mistakes made in childraising, emotional reactions, hx of alcohol abuse . Remote option to write apology to [facility], but consider first what she would actually need and want before trying to communicate it, lest it inadvertently come across as emotional manipulation . Other recommendations/advice as noted above . Continue to utilize previously learned skills ad lib . Maintain medication as prescribed and work faithfully with relevant prescriber(s) if any changes are desired or seem indicated . Call the clinic on-call service, present to ER, or call 911 if any life-threatening psychiatric crisis Return in about 1 month (around 09/14/2019) for session(s) already scheduled. Current [facility] appointments: Future Appointments  Date Time Provider [place]  08/21/2019  9:30 AM Laurin Coder, MD LBPU-PULCARE None  09/11/2019  9:00 AM Blanchie Serve, PhD CP-CP None  09/11/2019   2:30 PM TIMA-THN TIMA-TIMA None  09/11/2019  3:00 PM Glendale Chard, MD TIMA-TIMA None  09/23/2019 10:45 AM Frann Rider, NP GNA-GNA None  09/26/2019 10:00 AM TIMA-CCM CASE MANAGER TIMA-TIMA None    Blanchie Serve, PhD [name], PhD LP Clinical Psychologist, [facility] [address] [place], [place] [address] [phone]

## 2019-08-16 ENCOUNTER — Encounter: Payer: Self-pay | Admitting: Internal Medicine

## 2019-08-20 DIAGNOSIS — H353211 Exudative age-related macular degeneration, right eye, with active choroidal neovascularization: Secondary | ICD-10-CM | POA: Diagnosis not present

## 2019-08-20 DIAGNOSIS — H353122 Nonexudative age-related macular degeneration, left eye, intermediate dry stage: Secondary | ICD-10-CM | POA: Diagnosis not present

## 2019-08-21 ENCOUNTER — Encounter: Payer: Self-pay | Admitting: Pulmonary Disease

## 2019-08-21 ENCOUNTER — Ambulatory Visit (INDEPENDENT_AMBULATORY_CARE_PROVIDER_SITE_OTHER): Payer: Medicare Other | Admitting: Pulmonary Disease

## 2019-08-21 ENCOUNTER — Other Ambulatory Visit: Payer: Self-pay

## 2019-08-21 VITALS — BP 116/68 | HR 87 | Ht 62.0 in | Wt 168.0 lb

## 2019-08-21 DIAGNOSIS — J449 Chronic obstructive pulmonary disease, unspecified: Secondary | ICD-10-CM | POA: Diagnosis not present

## 2019-08-21 DIAGNOSIS — G4734 Idiopathic sleep related nonobstructive alveolar hypoventilation: Secondary | ICD-10-CM

## 2019-08-21 MED ORDER — BREO ELLIPTA 100-25 MCG/INH IN AEPB: 1.0000 | INHALATION_SPRAY | Freq: Every day | RESPIRATORY_TRACT | 0 refills | Status: DC

## 2019-08-21 NOTE — Progress Notes (Signed)
Subjective:    Patient ID: [name], female    DOB: 1956/06/24, 63 y.o.   MRN: [id number]  Patient being seen for difficulty sleeping, headaches in the morning  Sleep study recently only significant for oxygen desaturations  Patient does have a history of COPD in the past Quit smoking in 2011  She did have a brain bleed in 2011-2 events, had stents placed Prolonged recovery  Has been feeling more short of breath the last 3 to 4 years  She remembers being on oxygen prior to her stroke, remembers a diagnosis of COPD/emphysema She was using inhalers and nebulizers prior to that  Currently able to ambulate, does get short of breath with exertion Uses a walker, unsteady, if she goes slowly she is able to take of 100 steps  She does have daytime fatigue and sleepiness Multiple medications may be contributing to this   Comorbidities include hypertension, hypercholesterolemia, chronic headaches, CVA    Past Medical History:  Diagnosis Date  . Abnormal gait   . Amnesia   . Anemia    hx  . Aneurysm ([place])   . Anxiety   . Asthma   . Atrophic vaginitis   . Benign hypertensive heart disease   . Cardiomegaly   . Chest pain   . COPD (chronic obstructive pulmonary disease) ([place])     - PFTs  01/24/06 FEV1 69% ratio 68% diffusing capacity 57% with no improvement after B2   - PFT's 10/1/ 09 FEV1 77   ratio  76    dlco                   45            no resp to B2    - Nl alpha one antitripsin level 10/09  . Cough   . Depression   . Difficulty speaking   . Disorder of nasal cavity   . Dyspnea   . Enthesopathy of elbow region   . GERD (gastroesophageal reflux disease)   . Hammer toe   . Headache(784.0)   . Hemiparesis affecting dominant side as late effect of cerebrovascular accident ([place])   . History of kidney stones   . Hyperlipidemia   . Hypertension   . Hypothyroidism   . Increased urinary frequency   . Low compliance bladder   . Macular degeneration of both  eyes   . Malaise and fatigue   . Nonruptured cerebral aneurysm   . Obesity   . Pneumonia    hx  . Pure hypercholesterolemia   . Recurrent major depressive episodes ([place])   . Seizure disorder, secondary ([place])    2-3 focal siezures monthly  . Seizures ([place])   . Short-term memory loss    since stroke  . Skin sensation disturbance   . Stroke [facility]) 2011   rt sided weakness  . Swelling of limb   . TMJ (temporomandibular joint disorder)   . Urinary tract infectious disease   . Vertigo as late effect of stroke   . Vitamin D deficiency    Social History   Socioeconomic History  . Marital status: Married    Spouse name: Jori Moll  . Number of children: 2  . Years of education: 19  . Highest education level: Not on file  Occupational History  . 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 Smoking status: Former Smoker    Packs/day: 2.00    Types: Cigarettes  Quit date: 11/27/2009    Years since quitting: 9.7  . Smokeless tobacco: Never Used  . Tobacco comment: smoked 2.5 ppd x 5 years, 1ppd x 20+ years  Substance and Sexual Activity  . Alcohol use: No    Alcohol/week: 0.0 standard drinks    Comment: patient quit alcohol use 2005.   . Drug use: No  . Sexual activity: Not on file  Other Topics Concern  . Not on file  Social History Narrative   Patient lives at home with her husband Jori Moll).   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    Patient is on disability.    Patient has an Associates degree.   Patient is right-handed.   Patient drinks 2 cups of green tea most days.               Social Determinants of Health   Financial Resource Strain:   . Difficulty of Paying Living Expenses: Not on file  Food Insecurity:   . Worried About Charity fundraiser in the Last Year: Not on file  . Ran Out of Food in the Last Year: Not on file  Transportation Needs:   . Lack of Transportation  (Medical): Not on file  . Lack of Transportation (Non-Medical): Not on file  Physical Activity:   . Days of Exercise per Week: Not on file  . Minutes of Exercise per Session: Not on file  Stress:   . Feeling of Stress : Not on file  Social Connections:   . Frequency of Communication with Friends and Family: Not on file  . Frequency of Social Gatherings with Friends and Family: Not on file  . Attends Religious Services: Not on file  . Active Member of Clubs or Organizations: Not on file  . Attends Archivist Meetings: Not on file  . Marital Status: Not on file  Intimate Partner Violence:   . Fear of Current or Ex-Partner: Not on file  . Emotionally Abused: Not on file  . Physically Abused: Not on file  . Sexually Abused: Not on file   Family History  Problem Relation Age of Onset  . Heart disease Mother   . Heart disease Father   . Heart attack Father   . Cancer Father        prostate to lymph node  . Lung cancer Other   . Breast cancer Neg Hx    Review of Systems  Constitutional: Negative for fever and unexpected weight change.  HENT: Positive for dental problem, nosebleeds and sneezing. Negative for congestion, ear pain, postnasal drip, rhinorrhea, sinus pressure, sore throat and trouble swallowing.   Eyes: Negative for redness and itching.  Respiratory: Positive for cough and shortness of breath. Negative for chest tightness and wheezing.   Cardiovascular: Positive for leg swelling. Negative for palpitations.  Gastrointestinal: Negative for nausea and vomiting.  Genitourinary: Negative for dysuria.  Musculoskeletal: Positive for joint swelling.  Skin: Negative for rash.  Allergic/Immunologic: Negative.  Negative for environmental allergies, food allergies and immunocompromised state.  Neurological: Positive for headaches.  Hematological: Bruises/bleeds easily.  Psychiatric/Behavioral: Negative for dysphoric mood. The patient is nervous/anxious.          Objective:   Physical Exam Constitutional:      Appearance: Normal appearance.  HENT:     Head: Normocephalic and atraumatic.     Nose: Nose normal. No congestion.  Eyes:     Pupils: Pupils  are equal, round, and reactive to light.  Cardiovascular:     Rate and Rhythm: Normal rate and regular rhythm.     Pulses: Normal pulses.     Heart sounds: Normal heart sounds. No murmur. No friction rub.  Pulmonary:     Effort: Pulmonary effort is normal. No respiratory distress.     Breath sounds: Normal breath sounds. No stridor. No wheezing or rhonchi.  Abdominal:     General: Abdomen is flat.  Musculoskeletal:        General: No swelling.     Cervical back: Normal range of motion and neck supple. No rigidity or tenderness.  Skin:    General: Skin is warm.  Neurological:     Mental Status: She is alert.     Sensory: Sensory deficit present.  Psychiatric:        Mood and Affect: Mood normal.    Vitals:   08/21/19 0935  BP: 116/68  Pulse: 87  SpO2: 99%   Results of the Epworth flowsheet 08/21/2019  Sitting and reading 2  Watching TV 2  Sitting, inactive in a public place (e.g. a theatre or a meeting) 2  As a passenger in a car for an hour without a break 3  Lying down to rest in the afternoon when circumstances permit 3  Sitting and talking to someone 0  Sitting quietly after a lunch without alcohol 2  In a car, while stopped for a few minutes in traffic 0  Total score 14   Recent CT scan of the chest was reviewed with the patient showing some emphysematous changes  Recent sleep study reviewed-nocturnal desaturations     Assessment & Plan:  .  Patient with past history of COPD .  Oxygen supplementation in the past  .  Shortness of breath with activity .  Daytime sleepiness  .  Daytime sleepiness appears to be multifactorial may be related to medications .  Daytime sleepiness may also be related to prior neurological injury  .  Will obtain a pulmonary function  study .  Obtain an overnight oximetry .  We will start the patient on Breo-she does have a prior history of asthma  .  I will see her back in the office in about 4 to 6 weeks   .  Encouraged to call with any significant concerns

## 2019-08-21 NOTE — Patient Instructions (Signed)
Past history of asthma/COPD Oxygen use in the past Sleep study negative recently  We will get an overnight oximetry study Breathing study Start you on Breo inhaler-to be used once a day regardless of how you are feeling Use your proair as needed  I will see you back in 4 to 6 weeks

## 2019-08-23 ENCOUNTER — Telehealth: Payer: Self-pay | Admitting: Pulmonary Disease

## 2019-08-23 DIAGNOSIS — J449 Chronic obstructive pulmonary disease, unspecified: Secondary | ICD-10-CM

## 2019-08-23 NOTE — Telephone Encounter (Signed)
Called and spoke with pt who has concerns with breo that she was given a sample of. Pt got to reading about the breo.  On the breo sample, it said to talk to provider if you have any of the following medical conditions.  Pt said that she has all the following medical conditions: seizures, high BP, thyroid problems, pre-diabetic due to high cbgs, cataracts and macular degenerative disease that she has to get shots in eyes to help prevent her from going blind  All these conditions she said were listed on there to talk to provider about.  Pt said she did use the [place] today. Pt wants to know if it is safe for her to still use it with all these medical conditions she has or if there is something else that she should be switched to.  Dr. Ander Slade, please advise on this. Thanks!

## 2019-08-26 NOTE — Telephone Encounter (Signed)
It is okay to use Breo  If she feels she is having any symptoms of changes in her vision, then stop Breo She may stop Breo if she does not feel comfortable continuing with it, we may try Spiriva Respimat for obstructive lung disease  Any inhaler that has steroids in it will have the same side effect profile

## 2019-08-26 NOTE — Telephone Encounter (Signed)
Called and spoke with pt letting her know the info stated by AO and after stating that to pt in regards to the [facility] and the Spiriva, pt is apprehensive about being on an inhaler due to the side effects. Pt is wanting to know if there is any way she could be switched from an inhaler to neb meds to take daily instead.  Pt also asked if neb meds could not be done, could she just wait until results come back from ONO which is going to be performed after Christmas to see if she qualifies for O2 to wear at night as she does not want to be on an daily inhaler due to all her medical conditions and the side effects.

## 2019-08-27 MED ORDER — ALBUTEROL SULFATE (2.5 MG/3ML) 0.083% IN NEBU: 2.5000 mg | INHALATION_SOLUTION | Freq: Four times a day (QID) | RESPIRATORY_TRACT | 5 refills | Status: DC

## 2019-08-27 NOTE — Telephone Encounter (Signed)
She may discontinue inhaler  Prescription for nebulizer and albuterol nebulization as needed q6

## 2019-08-27 NOTE — Telephone Encounter (Signed)
Called and spoke to pt. Pt states she hasnt located her nebulizer. She thinks it is in her attic and no longer works. Advised pt we will place a new order for a nebulize and supplies. Order placed. Pt verbalized understanding and denied any further questions or concerns at this time.

## 2019-08-27 NOTE — Telephone Encounter (Signed)
Pt called back to state that she does in fact need a new nebulizer machine - states that the hoses are dry rotted etc. Please call at [phone]

## 2019-08-27 NOTE — Telephone Encounter (Signed)
Spoke with pt, aware of recs. Pt states she believes she has a nebulizer at home and a new one is not needed, but she wants to check and call us back to make sure.   Albuterol neb solution sent to pharmacy.  Will keep message open to ensure that pt either has a neb machine or that a new one is ordered.

## 2019-09-03 NOTE — Telephone Encounter (Signed)
Pls check with patient - has she received these materials yet?

## 2019-09-03 NOTE — Telephone Encounter (Signed)
A nebulizer is being sent to her sometime this week in the mail, she wore a oximeter yesterday 09/02/2019 the results will be sent to you, she stated they hadn't determined if she would need the Oxygen just yet.

## 2019-09-04 ENCOUNTER — Telehealth: Payer: Self-pay | Admitting: Pulmonary Disease

## 2019-09-04 DIAGNOSIS — G4734 Idiopathic sleep related nonobstructive alveolar hypoventilation: Secondary | ICD-10-CM

## 2019-09-04 NOTE — Telephone Encounter (Signed)
Overnight oximetry reviewed  Patient should be on oxygen supplementation at night at 2 L  2 L oxygen supplementation at night through DME

## 2019-09-05 NOTE — Telephone Encounter (Signed)
I called and spoke with the patient and made her aware of the results and have placed an order for Oxygen. Nothing further is needed.

## 2019-09-11 ENCOUNTER — Ambulatory Visit: Payer: Medicare Other | Admitting: Psychiatry

## 2019-09-11 ENCOUNTER — Ambulatory Visit (INDEPENDENT_AMBULATORY_CARE_PROVIDER_SITE_OTHER): Payer: Medicare Other

## 2019-09-11 ENCOUNTER — Ambulatory Visit: Payer: Medicare Other | Admitting: Nurse Practitioner

## 2019-09-11 ENCOUNTER — Other Ambulatory Visit: Payer: Self-pay

## 2019-09-11 ENCOUNTER — Ambulatory Visit: Payer: Medicare Other

## 2019-09-11 ENCOUNTER — Encounter: Payer: Self-pay | Admitting: Internal Medicine

## 2019-09-11 ENCOUNTER — Ambulatory Visit (INDEPENDENT_AMBULATORY_CARE_PROVIDER_SITE_OTHER): Payer: Medicare Other | Admitting: Internal Medicine

## 2019-09-11 VITALS — BP 122/76 | HR 98 | Temp 98.4°F | Ht 62.0 in | Wt 167.0 lb

## 2019-09-11 DIAGNOSIS — G8929 Other chronic pain: Secondary | ICD-10-CM | POA: Diagnosis not present

## 2019-09-11 DIAGNOSIS — Z6830 Body mass index (BMI) 30.0-30.9, adult: Secondary | ICD-10-CM

## 2019-09-11 DIAGNOSIS — M545 Low back pain, unspecified: Secondary | ICD-10-CM

## 2019-09-11 DIAGNOSIS — E6609 Other obesity due to excess calories: Secondary | ICD-10-CM

## 2019-09-11 DIAGNOSIS — I700 Atherosclerosis of aorta: Secondary | ICD-10-CM

## 2019-09-11 DIAGNOSIS — H353211 Exudative age-related macular degeneration, right eye, with active choroidal neovascularization: Secondary | ICD-10-CM | POA: Insufficient documentation

## 2019-09-11 DIAGNOSIS — Z79899 Other long term (current) drug therapy: Secondary | ICD-10-CM

## 2019-09-11 DIAGNOSIS — F419 Anxiety disorder, unspecified: Secondary | ICD-10-CM

## 2019-09-11 DIAGNOSIS — I10 Essential (primary) hypertension: Secondary | ICD-10-CM

## 2019-09-11 DIAGNOSIS — G4734 Idiopathic sleep related nonobstructive alveolar hypoventilation: Secondary | ICD-10-CM | POA: Diagnosis not present

## 2019-09-11 DIAGNOSIS — R5383 Other fatigue: Secondary | ICD-10-CM

## 2019-09-11 DIAGNOSIS — Z0000 Encounter for general adult medical examination without abnormal findings: Secondary | ICD-10-CM | POA: Diagnosis not present

## 2019-09-11 LAB — POCT URINALYSIS DIPSTICK
Bilirubin, UA: NEGATIVE
Blood, UA: NEGATIVE
Glucose, UA: NEGATIVE
Ketones, UA: NEGATIVE
Nitrite, UA: NEGATIVE
Protein, UA: NEGATIVE
Spec Grav, UA: 1.02 (ref 1.010–1.025)
Urobilinogen, UA: 1 E.U./dL
pH, UA: 7.5 (ref 5.0–8.0)

## 2019-09-11 LAB — POCT UA - MICROALBUMIN
Albumin/Creatinine Ratio, Urine, POC: <30
Creatinine, POC: 300 mg/dL
Microalbumin Ur, POC: 30 mg/L

## 2019-09-11 MED ORDER — CITALOPRAM HYDROBROMIDE 40 MG PO TABS: 40.0000 mg | ORAL_TABLET | Freq: Every day | ORAL | 1 refills | Status: DC

## 2019-09-11 NOTE — Patient Instructions (Signed)
Vitamin B12 Deficiency Vitamin B12 deficiency means that your body does not have enough vitamin B12. The body needs this vitamin:  To make red blood cells.  To make genes (DNA).  To help the nerves work. If you do not have enough vitamin B12 in your body, you can have health problems. What are the causes?  Not eating enough foods that contain vitamin B12.  Not being able to absorb vitamin B12 from the food that you eat.  Certain digestive system diseases.  A condition in which the body does not make enough of a certain protein, which results in too few red blood cells (pernicious anemia).  Having a surgery in which part of the stomach or small intestine is removed.  Taking medicines that make it hard for the body to absorb vitamin B12. These medicines include: ? Heartburn medicines. ? Some antibiotic medicines. ? Other medicines that are used to treat certain conditions. What increases the risk?  Being older than age 50.  Eating a vegetarian or vegan diet, especially while you are pregnant.  Eating a poor diet while you are pregnant.  Taking certain medicines.  Having alcoholism. What are the signs or symptoms? In some cases, there are no symptoms. If the condition leads to too few blood cells or nerve damage, symptoms can occur, such as:  Feeling weak.  Feeling tired (fatigued).  Not being hungry.  Weight loss.  A loss of feeling (numbness) or tingling in your hands and feet.  Redness and burning of the tongue.  Being mixed up (confused) or having memory problems.  Sadness (depression).  Problems with your senses. This can include color blindness, ringing in the ears, or loss of taste.  Watery poop (diarrhea) or trouble pooping (constipation).  Trouble walking. If anemia is very bad, symptoms can include:  Being short of breath.  Being dizzy.  Having a very fast heartbeat. How is this treated?  Changing the way you eat and drink, such  as: ? Eating more foods that contain vitamin B12. ? Drinking little or no alcohol.  Getting vitamin B12 shots.  Taking vitamin B12 supplements. Your doctor will tell you the dose that is best for you. Follow these instructions at home: Eating and drinking   Eat lots of healthy foods that contain vitamin B12. These include: ? Meats and poultry, such as beef, pork, chicken, turkey, and organ meats, such as liver. ? Seafood, such as clams, rainbow trout, salmon, tuna, and haddock. ? Eggs. ? Cereal and dairy products that have vitamin B12 added to them. Check the label. The items listed above may not be a complete list of what you can eat and drink. Contact a dietitian for more options. General instructions  Get any shots as told by your doctor.  Take supplements only as told by your doctor.  Do not drink alcohol if your doctor tells you not to. In some cases, you may only be asked to limit alcohol use.  Keep all follow-up visits as told by your doctor. This is important. Contact a doctor if:  Your symptoms come back. Get help right away if:  You have trouble breathing.  You have a very fast heartbeat.  You have chest pain.  You get dizzy.  You pass out. Summary  Vitamin B12 deficiency means that your body is not getting enough vitamin B12.  In some cases, there are no symptoms of this condition.  Treatment may include making a change in the way you eat and drink,   getting vitamin B12 shots, or taking supplements.  Eat lots of healthy foods that contain vitamin B12. This information is not intended to replace advice given to you by your health care provider. Make sure you discuss any questions you have with your health care provider. Document Revised: 05/01/2018 Document Reviewed: 05/01/2018 Elsevier Patient Education  2020 [employer]  

## 2019-09-11 NOTE — Progress Notes (Signed)
This visit occurred during the SARS-CoV-2 public health emergency.  Safety protocols were in place, including screening questions prior to the visit, additional usage of staff PPE, and extensive cleaning of exam room while observing appropriate contact time as indicated for disinfecting solutions.  Subjective:   [name] is a 64 y.o. female who presents for Medicare Annual (Subsequent) preventive examination.  Review of Systems:  n/a Cardiac Risk Factors include: hypertension;obesity (BMI >30kg/m2);sedentary lifestyle     Objective:     Vitals: BP 122/76 (BP Location: Left Arm, Patient Position: Sitting, Cuff Size: Normal)   Pulse 98   Temp 98.4 F (36.9 C) (Oral)   Ht 5\' 2"  (1.575 m)   Wt 167 lb (75.8 kg)   SpO2 99%   BMI 30.54 kg/m   Body mass index is 30.54 kg/m.  Advanced Directives 09/11/2019 09/03/2018 11/13/2017 08/18/2016 05/28/2015 01/29/2015 05/07/2014  Does Patient Have a Medical Advance Directive? Yes No No No Yes Yes -  Type of Paramedic of [place];Living will - - - Living will [place];Living will [place];Living will  Does patient want to make changes to medical advance directive? - - - - - - -  Copy of [place] in Chart? No - copy requested - - - No - copy requested - -  Would patient like information on creating a medical advance directive? - Yes (MAU/Ambulatory/Procedural Areas - Information given) No - Patient declined No - Patient declined - - -  Pre-existing out of facility DNR order (yellow form or pink MOST form) - - - - - - -    Tobacco Social History   Tobacco Use  Smoking Status Former Smoker  . Packs/day: 2.00  . Types: Cigarettes  . Quit date: 11/27/2009  . Years since quitting: 9.7  Smokeless Tobacco Never Used  Tobacco Comment   smoked 2.5 ppd x 5 years, 1ppd x 20+ years     Counseling given: Not Answered Comment: smoked 2.5 ppd x 5 years, 1ppd x 20+  years   Clinical Intake:  Pre-visit preparation completed: Yes  Pain : 0-10 Pain Score: 6  Pain Type: Chronic pain Pain Location: Back Pain Orientation: Left, Lower Pain Descriptors / Indicators: Sharp, Dull Pain Onset: More than a month ago Pain Frequency: Constant Pain Relieving Factors: medication helps  Pain Relieving Factors: medication helps  Nutritional Status: BMI > 30  Obese Nutritional Risks: None Diabetes: No  How often do you need to have someone help you when you read instructions, pamphlets, or other written materials from your doctor or pharmacy?: 1 - Never What is the last grade level you completed in school?: college  Interpreter Needed?: No  Information entered by :: NAllen LPN  Past Medical History:  Diagnosis Date  . Abnormal gait   . Amnesia   . Anemia    hx  . Aneurysm ([place])   . Anxiety   . Asthma   . Atrophic vaginitis   . Benign hypertensive heart disease   . Cardiomegaly   . Chest pain   . COPD (chronic obstructive pulmonary disease) (HCC)     - PFTs  01/24/06 FEV1 69% ratio 68% diffusing capacity 57% with no improvement after B2   - PFT's 10/1/ 09 FEV1 77   ratio  76    dlco                   45  no resp to B2    - Nl alpha one antitripsin level 10/09  . Cough   . Depression   . Difficulty speaking   . Disorder of nasal cavity   . Dyspnea   . Enthesopathy of elbow region   . GERD (gastroesophageal reflux disease)   . Hammer toe   . Headache(784.0)   . Hemiparesis affecting dominant side as late effect of cerebrovascular accident ([place])   . History of kidney stones   . Hyperlipidemia   . Hypertension   . Hypothyroidism   . Increased urinary frequency   . Low compliance bladder   . Macular degeneration of both eyes   . Malaise and fatigue   . Nonruptured cerebral aneurysm   . Obesity   . Pneumonia    hx  . Pure hypercholesterolemia   . Recurrent major depressive episodes ([place])   . Seizure disorder, secondary ([place])     2-3 focal siezures monthly  . Seizures ([place])   . Short-term memory loss    since stroke  . Skin sensation disturbance   . Stroke [facility]) 2011   rt sided weakness  . Swelling of limb   . TMJ (temporomandibular joint disorder)   . Urinary tract infectious disease   . Vertigo as late effect of stroke   . Vitamin D deficiency    Past Surgical History:  Procedure Laterality Date  . BACK SURGERY    . CEREBRAL ANGIOGRAM     x3  . COLONOSCOPY WITH PROPOFOL N/A 08/18/2016   Procedure: COLONOSCOPY WITH PROPOFOL;  Surgeon: Carol Ada, MD;  Location: [facility];  Service: Endoscopy;  Laterality: N/A;  . LAPAROSCOPIC HYSTERECTOMY    . MANDIBLE FRACTURE SURGERY    . RADIOLOGY WITH ANESTHESIA N/A 08/21/2013   Procedure: RADIOLOGY WITH ANESTHESIA;  Surgeon: Rob Hickman, MD;  Location: [facility];  Service: Radiology;  Laterality: N/A;  . stones     no surgery   Family History  Problem Relation Age of Onset  . Heart disease Mother   . Heart disease Father   . Heart attack Father   . Cancer Father        prostate to lymph node  . Lung cancer Other   . Breast cancer Neg Hx    Social History   Socioeconomic History  . Marital status: Married    Spouse name: Jori Moll  . Number of children: 2  . Years of education: 66  . Highest education level: Not on file  Occupational History  . 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 Smoking status: Former Smoker    Packs/day: 2.00    Types: Cigarettes    Quit date: 11/27/2009    Years since quitting: 9.7  . Smokeless tobacco: Never Used  . Tobacco comment: smoked 2.5 ppd x 5 years, 1ppd x 20+ years  Substance and Sexual Activity  . Alcohol use: No    Alcohol/week: 0.0 standard drinks    Comment: patient quit alcohol use 2005.   . Drug use: No  . Sexual activity: Not Currently  Other Topics Concern  . Not on file  Social History Narrative   Patient lives at home with her husband Jori Moll).    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    Patient is on disability.    Patient has an Associates degree.   Patient is right-handed.   Patient drinks 2  cups of green tea most days.               Social Determinants of Health   Financial Resource Strain: Low Risk   . Difficulty of Paying Living Expenses: Not hard at all  Food Insecurity: No Food Insecurity  . Worried About Charity fundraiser in the Last Year: Never true  . Ran Out of Food in the Last Year: Never true  Transportation Needs: No Transportation Needs  . Lack of Transportation (Medical): No  . Lack of Transportation (Non-Medical): No  Physical Activity: Inactive  . Days of Exercise per Week: 0 days  . Minutes of Exercise per Session: 0 min  Stress: No Stress Concern Present  . Feeling of Stress : Only a little  Social Connections:   . Frequency of Communication with Friends and Family: Not on file  . Frequency of Social Gatherings with Friends and Family: Not on file  . Attends Religious Services: Not on file  . Active Member of Clubs or Organizations: Not on file  . Attends Archivist Meetings: Not on file  . Marital Status: Not on file    Outpatient Encounter Medications as of 09/11/2019  Medication Sig  . albuterol (PROVENTIL HFA;VENTOLIN HFA) 108 (90 Base) MCG/ACT inhaler Inhale 2 puffs into the lungs every 6 (six) hours as needed for wheezing or shortness of breath.  Marland Kitchen albuterol (PROVENTIL) (2.5 MG/3ML) 0.083% nebulizer solution Take 3 mLs (2.5 mg total) by nebulization every 6 (six) hours.  . ALPRAZolam (XANAX) 0.25 MG tablet Take 1 tablet (0.25 mg total) by mouth 2 (two) times daily. As needed  . aspirin EC 81 MG tablet Take 81 mg by mouth daily.  . Cholecalciferol (VITAMIN D-3) 1000 units CAPS Take 1,000 Units by mouth daily.  . clopidogrel (PLAVIX) 75 MG tablet TAKE 1 TABLET DAILY  . CRANBERRY PO Take 2 tablets by mouth 2 (two) times daily.  .  cyclobenzaprine (FLEXERIL) 10 MG tablet TAKE 1 TABLET AT BEDTIME AS NEEDED  . diclofenac sodium (VOLTAREN) 1 % GEL Apply 2 g topically 3 (three) times daily as needed (for pain).   . fluticasone (FLONASE) 50 MCG/ACT nasal spray Place 2 sprays into both nostrils daily as needed (allergies or post-nasl drip).   Marland Kitchen levETIRAcetam (KEPPRA) 500 MG tablet Take 1 tablet (500 mg total) by mouth every 12 (twelve) hours.  Marland Kitchen levothyroxine (SYNTHROID) 50 MCG tablet TAKE 1 TABLET ON MONDAY THROUGH WEDNESDAY  . levothyroxine (SYNTHROID) 75 MCG tablet Take 75 mcg by mouth in the morning before breakfast on Thursday - Sunday , Take 50 mcg on other days  . losartan (COZAAR) 100 MG tablet Take 100 mg by mouth every morning.   . Multiple Vitamins-Minerals (ICAPS AREDS 2 PO) Take 1 capsule by mouth daily.  . Omega-3 Fatty Acids (FISH OIL) 1000 MG CAPS Take 2,000 mg by mouth 2 (two) times daily.   . rosuvastatin (CRESTOR) 10 MG tablet Take 1 tablet (10 mg total) by mouth daily.  Marland Kitchen topiramate (TOPAMAX) 25 MG tablet TAKE 1 TABLET IN THE MORNING AND 2 TABLETS IN THE EVENING  . vitamin C (ASCORBIC ACID) 500 MG tablet Take 500 mg by mouth daily.  . vitamin E 1000 UNIT capsule Take 1,000 Units by mouth at bedtime.  . [DISCONTINUED] citalopram (CELEXA) 40 MG tablet Take 1 tablet (40 mg total) by mouth at bedtime.  . fluticasone furoate-vilanterol (BREO ELLIPTA) 100-25 MCG/INH AEPB Inhale 1 puff into the lungs daily. (Patient not taking:  Reported on 09/11/2019)   No facility-administered encounter medications on file as of 09/11/2019.    Activities of Daily Living In your present state of health, do you have any difficulty performing the following activities: 09/11/2019  Hearing? N  Vision? Y  Difficulty concentrating or making decisions? Y  Comment since stroke  Walking or climbing stairs? Y  Dressing or bathing? N  Doing errands, shopping? Y  Preparing Food and eating ? Y  Comment assisted by husband  Using the Toilet? N   In the past six months, have you accidently leaked urine? Y  Do you have problems with loss of bowel control? N  Managing your Medications? Y  Comment husband helps sometimes  Managing your Finances? N  Housekeeping or managing your Housekeeping? Y  Some recent data might be hidden    Patient Care Team: Glendale Chard, MD as PCP - General (Internal Medicine) Rex Kras, Claudette Stapler, RN as [facility]    Assessment:   This is a routine wellness examination for Simrit.  Exercise Activities and Dietary recommendations Current Exercise Habits: The patient does not participate in regular exercise at present, Exercise limited by: orthopedic condition(s)  Goals    . Exercise 150 min/wk Moderate Activity     "I would like to exercise more and plan on it when we start back at the church"    . Exercise 150 min/wk Moderate Activity     09/11/2019, wants to exercise at home and eat healthier       Fall Risk Fall Risk  09/11/2019 06/11/2019 03/25/2019 09/18/2018 09/03/2018  Falls in the past year? 1 0 1 1 1   Comment loses balance or turns too quick - - - -  Number falls in past yr: 1 - 1 1 1   Injury with Fall? 1 - 1 1 1   Comment black eyes, knot on head - - hit head -  Risk Factor Category  - - - - -  Risk for fall due to : Impaired balance/gait;History of fall(s);Impaired mobility - - Impaired balance/gait -  Follow up Falls evaluation completed;Education provided;Falls prevention discussed - - - -   Is the patient's home free of loose throw rugs in walkways, pet beds, electrical cords, etc?   yes      Grab bars in the bathroom? no      Handrails on the stairs?   yes      Adequate lighting?   yes  Timed Get Up and Go performed: n/a  Depression Screen PHQ 2/9 Scores 09/11/2019 06/11/2019 09/03/2018  PHQ - 2 Score 0 0 0  PHQ- 9 Score 6 - -     Cognitive Function MMSE - Mini Mental State Exam 09/03/2018 11/03/2015 05/05/2015 11/05/2014  Orientation to time 4 4 5 4    Orientation to Place 5 4 4 5   Registration 3 3 3 3   Attention/ Calculation 5 5 4 5   Recall 3 2 1 2   Language- name 2 objects 2 2 2 2   Language- repeat 1 1 1 1   Language- follow 3 step command 3 3 3 3   Language- read & follow direction 1 1 1 1   Write a sentence 1 1 1 1   Copy design 1 1 1 1   Total score 29 27 26 28      6CIT Screen 09/11/2019  What Year? 0 points  What month? 0 points  What time? 0 points  Count back from 20 0 points  Months in reverse 0 points  Repeat phrase 0 points  Total Score 0    Immunization History  Administered Date(s) Administered  . Influenza,inj,Quad PF,6+ Mos 05/20/2019  . Influenza-Unspecified 06/20/2018    Qualifies for Shingles Vaccine? yes  Screening Tests Health Maintenance  Topic Date Due  . HIV Screening  11/08/1970  . PAP SMEAR-Modifier  08/31/2020  . MAMMOGRAM  05/23/2021  . TETANUS/TDAP  03/06/2022  . COLONOSCOPY  08/18/2026  . INFLUENZA VACCINE  Completed  . Hepatitis C Screening  Completed    Cancer Screenings: Lung: Low Dose CT Chest recommended if Age 44-80 years, 30 pack-year currently smoking OR have quit w/in 15years. Patient does not qualify. Breast:  Up to date on Mammogram? Yes   Up to date of Bone Density/Dexa? Yes Colorectal: up to date  Additional Screenings: : Hepatitis C Screening: 09/2012     Plan:   Patient states that she wants to start exercising at home and eat healthier.   I have personally reviewed and noted the following in the patient's chart:   . Medical and social history . Use of alcohol, tobacco or illicit drugs  . Current medications and supplements . Functional ability and status . Nutritional status . Physical activity . Advanced directives . List of other physicians . Hospitalizations, surgeries, and ER visits in previous 12 months . Vitals . Screenings to include cognitive, depression, and falls . Referrals and appointments  In addition, I have reviewed and discussed with patient  certain preventive protocols, quality metrics, and best practice recommendations. A written personalized care plan for preventive services as well as general preventive health recommendations were provided to patient.     Kellie Simmering, LPN  [id number]

## 2019-09-11 NOTE — Patient Instructions (Signed)
[name] , Thank you for taking time to come for your Medicare Wellness Visit. I appreciate your ongoing commitment to your health goals. Please review the following plan we discussed and let me know if I can assist you in the future.   Screening recommendations/referrals: Colonoscopy: 08/2016 Mammogram: 05/2019 Bone Density: 04/2018 Recommended yearly ophthalmology/optometry visit for glaucoma screening and checkup Recommended yearly dental visit for hygiene and checkup  Vaccinations: Influenza vaccine: 05/2019 Pneumococcal vaccine: n/a Tdap vaccine: 03/2012 Shingles vaccine: 08/2017    Advanced directives: Please bring a copy of your POA (Power of [place]) and/or Living Will to your next appointment.    Conditions/risks identified: obesity  Next appointment: 03/11/2020 at 2:15  Preventive Care 40-64 Years, Female Preventive care refers to lifestyle choices and visits with your health care provider that can promote health and wellness. What does preventive care include?  A yearly physical exam. This is also called an annual well check.  Dental exams once or twice a year.  Routine eye exams. Ask your health care provider how often you should have your eyes checked.  Personal lifestyle choices, including:  Daily care of your teeth and gums.  Regular physical activity.  Eating a healthy diet.  Avoiding tobacco and drug use.  Limiting alcohol use.  Practicing safe sex.  Taking low-dose aspirin daily starting at age 64.  Taking vitamin and mineral supplements as recommended by your health care provider. What happens during an annual well check? The services and screenings done by your health care provider during your annual well check will depend on your age, overall health, lifestyle risk factors, and family history of disease. Counseling  Your health care provider may ask you questions about your:  Alcohol use.  Tobacco use.  Drug use.  Emotional  well-being.  Home and relationship well-being.  Sexual activity.  Eating habits.  Work and work Statistician.  Method of birth control.  Menstrual cycle.  Pregnancy history. Screening  You may have the following tests or measurements:  Height, weight, and BMI.  Blood pressure.  Lipid and cholesterol levels. These may be checked every 5 years, or more frequently if you are over 23 years old.  Skin check.  Lung cancer screening. You may have this screening every year starting at age 59 if you have a 30-pack-year history of smoking and currently smoke or have quit within the past 15 years.  Fecal occult blood test (FOBT) of the stool. You may have this test every year starting at age 68.  Flexible sigmoidoscopy or colonoscopy. You may have a sigmoidoscopy every 5 years or a colonoscopy every 10 years starting at age 60.  Hepatitis C blood test.  Hepatitis B blood test.  Sexually transmitted disease (STD) testing.  Diabetes screening. This is done by checking your blood sugar (glucose) after you have not eaten for a while (fasting). You may have this done every 1-3 years.  Mammogram. This may be done every 1-2 years. Talk to your health care provider about when you should start having regular mammograms. This may depend on whether you have a family history of breast cancer.  BRCA-related cancer screening. This may be done if you have a family history of breast, ovarian, tubal, or peritoneal cancers.  Pelvic exam and Pap test. This may be done every 3 years starting at age 1. Starting at age 3, this may be done every 5 years if you have a Pap test in combination with an HPV test.  Bone density scan.  This is done to screen for osteoporosis. You may have this scan if you are at high risk for osteoporosis. Discuss your test results, treatment options, and if necessary, the need for more tests with your health care provider. Vaccines  Your health care provider may recommend  certain vaccines, such as:  Influenza vaccine. This is recommended every year.  Tetanus, diphtheria, and acellular pertussis (Tdap, Td) vaccine. You may need a Td booster every 10 years.  Zoster vaccine. You may need this after age 26.  Pneumococcal 13-valent conjugate (PCV13) vaccine. You may need this if you have certain conditions and were not previously vaccinated.  Pneumococcal polysaccharide (PPSV23) vaccine. You may need one or two doses if you smoke cigarettes or if you have certain conditions. Talk to your health care provider about which screenings and vaccines you need and how often you need them. This information is not intended to replace advice given to you by your health care provider. Make sure you discuss any questions you have with your health care provider. Document Released: 09/18/2015 Document Revised: 05/11/2016 Document Reviewed: 06/23/2015 Elsevier Interactive Patient Education  2017 [place] Prevention in the Home Falls can cause injuries. They can happen to people of all ages. There are many things you can do to make your home safe and to help prevent falls. What can I do on the outside of my home?  Regularly fix the edges of walkways and driveways and fix any cracks.  Remove anything that might make you trip as you walk through a door, such as a raised step or threshold.  Trim any bushes or trees on the path to your home.  Use bright outdoor lighting.  Clear any walking paths of anything that might make someone trip, such as rocks or tools.  Regularly check to see if handrails are loose or broken. Make sure that both sides of any steps have handrails.  Any raised decks and porches should have guardrails on the edges.  Have any leaves, snow, or ice cleared regularly.  Use sand or salt on walking paths during winter.  Clean up any spills in your garage right away. This includes oil or grease spills. What can I do in the bathroom?  Use  night lights.  Install grab bars by the toilet and in the tub and shower. Do not use towel bars as grab bars.  Use non-skid mats or decals in the tub or shower.  If you need to sit down in the shower, use a plastic, non-slip stool.  Keep the floor dry. Clean up any water that spills on the floor as soon as it happens.  Remove soap buildup in the tub or shower regularly.  Attach bath mats securely with double-sided non-slip rug tape.  Do not have throw rugs and other things on the floor that can make you trip. What can I do in the bedroom?  Use night lights.  Make sure that you have a light by your bed that is easy to reach.  Do not use any sheets or blankets that are too big for your bed. They should not hang down onto the floor.  Have a firm chair that has side arms. You can use this for support while you get dressed.  Do not have throw rugs and other things on the floor that can make you trip. What can I do in the kitchen?  Clean up any spills right away.  Avoid walking on wet floors.  Keep items that you use a lot in easy-to-reach places.  If you need to reach something above you, use a strong step stool that has a grab bar.  Keep electrical cords out of the way.  Do not use floor polish or wax that makes floors slippery. If you must use wax, use non-skid floor wax.  Do not have throw rugs and other things on the floor that can make you trip. What can I do with my stairs?  Do not leave any items on the stairs.  Make sure that there are handrails on both sides of the stairs and use them. Fix handrails that are broken or loose. Make sure that handrails are as long as the stairways.  Check any carpeting to make sure that it is firmly attached to the stairs. Fix any carpet that is loose or worn.  Avoid having throw rugs at the top or bottom of the stairs. If you do have throw rugs, attach them to the floor with carpet tape.  Make sure that you have a light switch at  the top of the stairs and the bottom of the stairs. If you do not have them, ask someone to add them for you. What else can I do to help prevent falls?  Wear shoes that:  Do not have high heels.  Have rubber bottoms.  Are comfortable and fit you well.  Are closed at the toe. Do not wear sandals.  If you use a stepladder:  Make sure that it is fully opened. Do not climb a closed stepladder.  Make sure that both sides of the stepladder are locked into place.  Ask someone to hold it for you, if possible.  Clearly mark and make sure that you can see:  Any grab bars or handrails.  First and last steps.  Where the edge of each step is.  Use tools that help you move around (mobility aids) if they are needed. These include:  Canes.  Walkers.  Scooters.  Crutches.  Turn on the lights when you go into a dark area. Replace any light bulbs as soon as they burn out.  Set up your furniture so you have a clear path. Avoid moving your furniture around.  If any of your floors are uneven, fix them.  If there are any pets around you, be aware of where they are.  Review your medicines with your doctor. Some medicines can make you feel dizzy. This can increase your chance of falling. Ask your doctor what other things that you can do to help prevent falls. This information is not intended to replace advice given to you by your health care provider. Make sure you discuss any questions you have with your health care provider. Document Released: 06/18/2009 Document Revised: 01/28/2016 Document Reviewed: 09/26/2014 Elsevier Interactive Patient Education  2017 [employer].

## 2019-09-12 ENCOUNTER — Telehealth: Payer: Self-pay

## 2019-09-12 LAB — VITAMIN B12: Vitamin B-12: >2000 pg/mL — ABNORMAL HIGH (ref 232–1245)

## 2019-09-12 NOTE — Telephone Encounter (Signed)
Pt advised to take b12 injections once monthly

## 2019-09-17 ENCOUNTER — Other Ambulatory Visit (HOSPITAL_COMMUNITY): Payer: Self-pay | Admitting: Interventional Radiology

## 2019-09-17 ENCOUNTER — Telehealth (HOSPITAL_COMMUNITY): Payer: Self-pay

## 2019-09-17 DIAGNOSIS — I671 Cerebral aneurysm, nonruptured: Secondary | ICD-10-CM

## 2019-09-17 NOTE — Telephone Encounter (Signed)
Called to schedule f/u mri, no answer, left vm. AW 

## 2019-09-21 NOTE — Progress Notes (Signed)
This visit occurred during the SARS-CoV-2 public health emergency.  Safety protocols were in place, including screening questions prior to the visit, additional usage of staff PPE, and extensive cleaning of exam room while observing appropriate contact time as indicated for disinfecting solutions.  Subjective:     Patient ID: [name] , female    DOB: 1956/07/01 , 64 y.o.   MRN: [id number]   Chief Complaint  Patient presents with  . Lexapro f/u    HPI  She is here today for f/u citalopram.  The dose was increased at her last visit for sx of worsening anxiety. She feels that this has improved her symptoms.   Anxiety Presents for follow-up visit. Patient reports no chest pain, excessive worry, nervous/anxious behavior or shortness of breath. The severity of symptoms is moderate. The quality of sleep is fair.   Compliance with medications is 76-100%.     Past Medical History:  Diagnosis Date  . Abnormal gait   . Amnesia   . Anemia    hx  . Aneurysm ([place])   . Anxiety   . Asthma   . Atrophic vaginitis   . Benign hypertensive heart disease   . Cardiomegaly   . Chest pain   . COPD (chronic obstructive pulmonary disease) ([place])     - PFTs  01/24/06 FEV1 69% ratio 68% diffusing capacity 57% with no improvement after B2   - PFT's 10/1/ 09 FEV1 77   ratio  76    dlco                   45            no resp to B2    - Nl alpha one antitripsin level 10/09  . Cough   . Depression   . Difficulty speaking   . Disorder of nasal cavity   . Dyspnea   . Enthesopathy of elbow region   . GERD (gastroesophageal reflux disease)   . Hammer toe   . Headache(784.0)   . Hemiparesis affecting dominant side as late effect of cerebrovascular accident ([place])   . History of kidney stones   . Hyperlipidemia   . Hypertension   . Hypothyroidism   . Increased urinary frequency   . Low compliance bladder   . Macular degeneration of both eyes   . Malaise and fatigue   . Nonruptured cerebral  aneurysm   . Obesity   . Pneumonia    hx  . Pure hypercholesterolemia   . Recurrent major depressive episodes ([place])   . Seizure disorder, secondary ([place])    2-3 focal siezures monthly  . Seizures ([place])   . Short-term memory loss    since stroke  . Skin sensation disturbance   . Stroke [facility]) 2011   rt sided weakness  . Swelling of limb   . TMJ (temporomandibular joint disorder)   . Urinary tract infectious disease   . Vertigo as late effect of stroke   . Vitamin D deficiency      Family History  Problem Relation Age of Onset  . Heart disease Mother   . Heart disease Father   . Heart attack Father   . Cancer Father        prostate to lymph node  . Lung cancer Other   . Breast cancer Neg Hx      Current Outpatient Medications:  .  albuterol (PROVENTIL HFA;VENTOLIN HFA) 108 (90 Base) MCG/ACT inhaler, Inhale 2 puffs into the  lungs every 6 (six) hours as needed for wheezing or shortness of breath., Disp: 1 Inhaler, Rfl: 2 .  albuterol (PROVENTIL) (2.5 MG/3ML) 0.083% nebulizer solution, Take 3 mLs (2.5 mg total) by nebulization every 6 (six) hours., Disp: 360 mL, Rfl: 5 .  ALPRAZolam (XANAX) 0.25 MG tablet, Take 1 tablet (0.25 mg total) by mouth 2 (two) times daily. As needed, Disp: 90 tablet, Rfl: 1 .  aspirin EC 81 MG tablet, Take 81 mg by mouth daily., Disp: , Rfl:  .  Cholecalciferol (VITAMIN D-3) 1000 units CAPS, Take 1,000 Units by mouth daily., Disp: , Rfl:  .  citalopram (CELEXA) 40 MG tablet, Take 1 tablet (40 mg total) by mouth at bedtime., Disp: 90 tablet, Rfl: 1 .  clopidogrel (PLAVIX) 75 MG tablet, TAKE 1 TABLET DAILY, Disp: 90 tablet, Rfl: 4 .  CRANBERRY PO, Take 2 tablets by mouth 2 (two) times daily., Disp: , Rfl:  .  cyclobenzaprine (FLEXERIL) 10 MG tablet, TAKE 1 TABLET AT BEDTIME AS NEEDED, Disp: 90 tablet, Rfl: 3 .  diclofenac sodium (VOLTAREN) 1 % GEL, Apply 2 g topically 3 (three) times daily as needed (for pain). , Disp: , Rfl:  .  fluticasone (FLONASE) 50  MCG/ACT nasal spray, Place 2 sprays into both nostrils daily as needed (allergies or post-nasl drip). , Disp: , Rfl: 3 .  fluticasone furoate-vilanterol (BREO ELLIPTA) 100-25 MCG/INH AEPB, Inhale 1 puff into the lungs daily. (Patient not taking: Reported on 09/11/2019), Disp: 14 each, Rfl: 0 .  levETIRAcetam (KEPPRA) 500 MG tablet, Take 1 tablet (500 mg total) by mouth every 12 (twelve) hours., Disp: 180 tablet, Rfl: 3 .  levothyroxine (SYNTHROID) 50 MCG tablet, TAKE 1 TABLET ON MONDAY THROUGH WEDNESDAY, Disp: 30 tablet, Rfl: 1 .  levothyroxine (SYNTHROID) 75 MCG tablet, Take 75 mcg by mouth in the morning before breakfast on Thursday - Sunday , Take 50 mcg on other days, Disp: 48 tablet, Rfl: 1 .  losartan (COZAAR) 100 MG tablet, Take 100 mg by mouth every morning. , Disp: , Rfl:  .  Multiple Vitamins-Minerals (ICAPS AREDS 2 PO), Take 1 capsule by mouth daily., Disp: , Rfl:  .  Omega-3 Fatty Acids (FISH OIL) 1000 MG CAPS, Take 2,000 mg by mouth 2 (two) times daily. , Disp: , Rfl:  .  rosuvastatin (CRESTOR) 10 MG tablet, Take 1 tablet (10 mg total) by mouth daily., Disp: 90 tablet, Rfl: 2 .  topiramate (TOPAMAX) 25 MG tablet, TAKE 1 TABLET IN THE MORNING AND 2 TABLETS IN THE EVENING, Disp: 270 tablet, Rfl: 3 .  vitamin C (ASCORBIC ACID) 500 MG tablet, Take 500 mg by mouth daily., Disp: , Rfl:  .  vitamin E 1000 UNIT capsule, Take 1,000 Units by mouth at bedtime., Disp: , Rfl:    Allergies  Allergen Reactions  . Phenytoin Anaphylaxis, Hives and Swelling     Review of Systems  Constitutional: Negative.   Respiratory: Negative.  Negative for shortness of breath.   Cardiovascular: Negative.  Negative for chest pain.  Gastrointestinal: Negative.   Musculoskeletal: Positive for back pain.       She has chronic back pain with sciatica. She has flares on occasion, she feels ok today  Neurological: Negative.   Psychiatric/Behavioral: Negative.  The patient is not nervous/anxious.      Today's  Vitals   09/11/19 1501  BP: 122/76  Pulse: 98  Temp: 98.4 F (36.9 C)  TempSrc: Oral  Weight: 167 lb (75.8 kg)  Height: 5'  2" (1.575 m)  PainSc: 4   PainLoc: Abdomen   Body mass index is 30.54 kg/m.   Objective:  Physical Exam Vitals and nursing note reviewed.  Constitutional:      Appearance: Normal appearance.  HENT:     Head: Normocephalic and atraumatic.  Cardiovascular:     Rate and Rhythm: Normal rate and regular rhythm.     Heart sounds: Normal heart sounds.  Pulmonary:     Effort: Pulmonary effort is normal.     Breath sounds: Normal breath sounds.  Skin:    General: Skin is warm.  Neurological:     General: No focal deficit present.     Mental Status: She is alert.  Psychiatric:        Mood and Affect: Mood normal.        Behavior: Behavior normal.         Assessment And Plan:     1. Anxiety  Improved with citalopram. She was given 90 day refill. She will continue with current meds. Also advised to take magnesium nightly.   2. Fatigue, unspecified type  Chronic, persistent after her previous stroke. She is encouraged to stay well hydrated.   3. Nocturnal hypoxemia  She is on nocturnal oxygen supplementation.   4. Exudative age-related macular degeneration of right eye with active choroidal neovascularization (HCC)  Chronic, as per Ophthalmology.   5. Aortic atherosclerosis (HCC)  Chronic. Importance of statin therapy along with dietary compliance was discussed with the patient. She is encouraged to take rosuvastatin daily as directed.    6. Chronic bilateral low back pain without sciatica  Chronic, intermittent. She will continue to take muscle relaxers as needed.   7. Class 1 obesity due to excess calories with serious comorbidity and body mass index (BMI) of 30.0 to 30.9 in adult  She is advised to lose ten pounds to decrease cardiac risk. She is advised to perform chair exercises as well.   8. Drug therapy  - Vitamin B12      [name], MD    THE PATIENT IS ENCOURAGED TO PRACTICE SOCIAL DISTANCING DUE TO THE COVID-19 PANDEMIC.

## 2019-09-23 ENCOUNTER — Other Ambulatory Visit: Payer: Self-pay

## 2019-09-23 ENCOUNTER — Encounter: Payer: Self-pay | Admitting: Adult Health

## 2019-09-23 ENCOUNTER — Ambulatory Visit (INDEPENDENT_AMBULATORY_CARE_PROVIDER_SITE_OTHER): Payer: Medicare Other | Admitting: Adult Health

## 2019-09-23 VITALS — BP 128/76 | HR 87 | Temp 97.6°F | Ht 62.0 in | Wt 167.2 lb

## 2019-09-23 DIAGNOSIS — R269 Unspecified abnormalities of gait and mobility: Secondary | ICD-10-CM | POA: Diagnosis not present

## 2019-09-23 DIAGNOSIS — Z8673 Personal history of transient ischemic attack (TIA), and cerebral infarction without residual deficits: Secondary | ICD-10-CM | POA: Diagnosis not present

## 2019-09-23 DIAGNOSIS — G40909 Epilepsy, unspecified, not intractable, without status epilepticus: Secondary | ICD-10-CM | POA: Diagnosis not present

## 2019-09-23 DIAGNOSIS — I69351 Hemiplegia and hemiparesis following cerebral infarction affecting right dominant side: Secondary | ICD-10-CM | POA: Diagnosis not present

## 2019-09-23 DIAGNOSIS — Z7409 Other reduced mobility: Secondary | ICD-10-CM | POA: Diagnosis not present

## 2019-09-23 DIAGNOSIS — G43009 Migraine without aura, not intractable, without status migrainosus: Secondary | ICD-10-CM | POA: Diagnosis not present

## 2019-09-23 MED ORDER — TOPIRAMATE 25 MG PO TABS: 25.0000 mg | ORAL_TABLET | Freq: Two times a day (BID) | ORAL | 2 refills | Status: DC

## 2019-09-23 MED ORDER — LEVETIRACETAM 500 MG PO TABS: 500.0000 mg | ORAL_TABLET | Freq: Two times a day (BID) | ORAL | 3 refills | Status: DC

## 2019-09-23 NOTE — Patient Instructions (Addendum)
Your Plan:  Continue Keppra 500 mg twice daily for seizure prevention  Continue Topamax 25 mg a.m. and 50 mg p.m. for migraine management/prevention  Continue on aspirin, Plavix and Crestor for secondary stroke prevention  Follow-up with Dr. Estanislado Pandy next week as scheduled for ongoing monitoring  Recommend trialing a course of physical therapy due to likely deconditioning with decreased activity level    Follow-up in 1 year or call earlier if needed      Thank you for coming to see [place] at [facility]. I hope we have been able to provide you high quality care today.  You may receive a patient satisfaction survey over the next few weeks. We would appreciate your feedback and comments so that we may continue to improve ourselves and the health of our patients.

## 2019-09-23 NOTE — Progress Notes (Signed)
[facility]  PATIENT: [name] DOB: 1956-01-11   REASON FOR VISIT: Follow-up for nontraumatic intracerebral hemorrhage, abnormality of gait, right foot drop, complex partial seizure disorder, and vascular headaches   HISTORY FROM: Patient and [name]  HISTORY OF PRESENT ILLNESS:2/28/17[name], 64 year old female returns for followup. She was last seen in our office 05/05/15. 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 She states her headaches are doing well on Topamax 75 mg daily however she recently went to the eye doctor and was told she probably needed to discontinue the Topamax. I see nothing in the record. Her tremors were doing well,not bothersome She remains on Keppra She has not had any further seizures. She does complain of occasional fluttering of both her eyes but this is transient and is not similar to her prior seizures. She continues to have right-sided weakness and gait difficulty and incoordination is unchanged. She walks with a single-point cane. She is no longer using her TENS unit as the battery needs replacing. She is also on aspirin and Plavix and CT angio is followed by Dr. Britt Bottom now yearly . Her mild cognitive stable. She has had one fall in the last year. She returns for routine followup.   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  She returns for followup after last visit on 09/20/12. She continues to have headaches several times a week , she does not describe  them as disabling.. She remains on Topamax 25mg  tab, total dose of 75mg  daily, is tolerating well without side effects but complains of mild memory loss. She underwent cerebral catheter angiogram on 10//13 by Dr.Deveshwar which showed stable appearance of pericallosal and left P2/P3 segment aneurysms. She states some mild tremor in the hand continues and it is not disabling.She complains of persistent mild right-sided weakness as well as some temperature sensitivity and sensation of coldness of the right foot ever since her stroke in 2011.She does feel her AFO tens unit is effective. She has fallen recently.   Update 11/6/2017PS :  She returns for follow-up after last visit 8 months ago. She is accompanied by [name]. Patient states she is doing well except that she had a fall a month ago. She bruised her right hand which is yet swollen and painful. She was seen in urgent care and x-rays did not show any fracture but her symptoms have persisted. She has not yet seen her primary physician for this. She states her migraines are doing well and she has only occasional 1 or 2 headaches a month which are not bad. She remains on Topamax 25 g twice daily which is tolerating well. She continues to have poor short-term memory difficulties which are about unchanged. She states her blood pressure is well controlled. She is tolerating Lipitor well without muscle aches or pain. She remains on Plavix which she does bruise easily but has not had any major bleeding problems. She is walking with a cane. She has no new complaints. UPDATE 05/10/2018[name], 64 year old female returns for follow-up She has hx of left frontopareital parenchymal hemorrhage in March 2011 from PRES and malignant hypertension.she has a  history of chronic headaches. She states her headaches are doing well on Topamax 75 mg daily.. Her tremors were doing well,not bothersome She remains on Keppra 500 mg twice daily .She has not had any  further seizures. She does complain of occasional fluttering of both her eyes but this is transient and is not similar to her prior seizures. She continues to have right-sided weakness and gait difficulty and incoordination is unchanged. She walks with a single-point cane. She is no longer using her TENS unit. She is also on aspirin and Plavix and CT angio is followed by Dr. Britt Bottom now yearly . Blood pressure in the office today 123/71 Her mild cognitive impairment stable. She has had 2 falls since last seen her  right ankle is in a boot . She returns for routine followup.  Update 09/18/2017 PS: She returns for follow-up after last visit 7 months ago. She is accompanied by [name]. She continues to do well and has not had any recurrent strokes or seizures since March 2011. She states her blood pressure is well controlled and today it is 119/79. She remains on Plavix which is tolerating well without bruising or bleeding. She states her lipids have been controlled. She has not had any seizures since 2011 but she is reluctant to consider reducing Keppra which she seems to be tolerating well. She takes a migraine headaches are well controlled. She gets rare 1 significant headache a month does have some mild dull aching pains 2-3 times a week. She takes Topamax 25 mg twice daily.  He continues to have mild right spastic hemiparesis with foot drop. She does use a cane for long distances. She is careful with her walking and has not had any falls or injuries.  UPDATE 1/14/2020[name] returns for yearly follow-up.  She has not had recurrent strokes or seizure activity since March 2011 blood pressure well controlled in the office today at 126/85.  She is currently on Plavix and aspirin with minimal bruising and no bleeding.  She remains on Crestor and lipids are followed by primary care.  According to the patient it was okay at her last check.  She remains on Keppra for seizure disorder without side  effects.  She is on Topamax for migraine prevention.  She ambulates with a single-point cane she has a foot drop and wears AFO on the right.  She also has a spastic hemiparesis on the right.  She continues to follow with Dr. Estanislado Pandy every year.  She returns for reevaluation  Update 09/23/2019: [name] is a 64 year old female for follow-up regarding history of stroke, seizures and headaches.  Residual stroke deficits of mild right spastic hemiparesis but overall stable.  Continues to use cane for ambulation as well as AFO on right.  She does report 3 falls since prior visit without injury.  She feels as though slightly worsening mobility due to limited exercise due to COVID-19 restrictions.  She is interested in pursuing outpatient therapy as able.  Continues to follow with Dr. Estanislado Pandy for ongoing monitoring and has follow-up scheduled next week.  She continues on Keppra 500 mg twice daily tolerating well without any reoccurring seizure activity.  Headaches have greatly improved with ongoing use of Topamax and recently initiated nightly O2 after sleep study showed duration of hypoxemia.  She does report mild short-term memory impairment but has been stable without worsening.  Continues on aspirin 81 mg daily and Crestor for secondary stroke prevention without side effects.  Blood pressure today  satisfactory at 128/76.  No further concerns at this       REVIEW OF SYSTEMS: Full 14 system review of systems performed and notable only for those listed, all others are neg:  Constitutional: neg Cardiovascular: neg Ear/Nose/Throat: neg  Skin: neg Eyes: neg Respiratory: cough Gastroitestinal: neg  Hematology/Lymphatic: neg Endocrine: neg Musculoskeletal: Walking difficulty Allergy/Immunology: neg Neurological: Weakness, tremors, headache, seizures Psychiatric: Depression and anxiety Sleep : neg   ALLERGIES: Allergies  Allergen Reactions  . Phenytoin Anaphylaxis, Hives and Swelling     HOME MEDICATIONS: Outpatient Medications Prior to Visit  Medication Sig Dispense Refill  . albuterol (PROVENTIL HFA;VENTOLIN HFA) 108 (90 Base) MCG/ACT inhaler Inhale 2 puffs into the lungs every 6 (six) hours as needed for wheezing or shortness of breath. 1 Inhaler 2  . albuterol (PROVENTIL) (2.5 MG/3ML) 0.083% nebulizer solution Take 3 mLs (2.5 mg total) by nebulization every 6 (six) hours. 360 mL 5  . ALPRAZolam (XANAX) 0.25 MG tablet Take 1 tablet (0.25 mg total) by mouth 2 (two) times daily. As needed 90 tablet 1  . aspirin EC 81 MG tablet Take 81 mg by mouth daily.    . Cholecalciferol (VITAMIN D-3) 1000 units CAPS Take 1,000 Units by mouth daily.    . citalopram (CELEXA) 40 MG tablet Take 1 tablet (40 mg total) by mouth at bedtime. 90 tablet 1  . clopidogrel (PLAVIX) 75 MG tablet TAKE 1 TABLET DAILY 90 tablet 4  . CRANBERRY PO Take 2 tablets by mouth 2 (two) times daily.    . cyclobenzaprine (FLEXERIL) 10 MG tablet TAKE 1 TABLET AT BEDTIME AS NEEDED 90 tablet 3  . diclofenac sodium (VOLTAREN) 1 % GEL Apply 2 g topically 3 (three) times daily as needed (for pain).     . fluticasone (FLONASE) 50 MCG/ACT nasal spray Place 2 sprays into both nostrils daily as needed (allergies or post-nasl drip).   3  . levETIRAcetam (KEPPRA) 500 MG tablet Take 1 tablet (500 mg total) by mouth every 12 (twelve) hours. 180 tablet 3  . levothyroxine (SYNTHROID) 50 MCG tablet TAKE 1 TABLET ON MONDAY THROUGH WEDNESDAY 30 tablet 1  . levothyroxine (SYNTHROID) 75 MCG tablet Take 75 mcg by mouth in the morning before breakfast on Thursday - Sunday , Take 50 mcg on other days 48 tablet 1  . losartan (COZAAR) 100 MG tablet Take 100 mg by mouth every morning.     . Multiple Vitamins-Minerals (ICAPS AREDS 2 PO) Take 1 capsule by mouth daily.    . Omega-3 Fatty Acids (FISH OIL) 1000 MG CAPS Take 2,000 mg by mouth 2 (two) times daily.     . rosuvastatin (CRESTOR) 10 MG tablet Take 1 tablet (10 mg total) by mouth  daily. 90 tablet 2  . topiramate (TOPAMAX) 25 MG tablet TAKE 1 TABLET IN THE MORNING AND 2 TABLETS IN THE EVENING 270 tablet 3  . vitamin C (ASCORBIC ACID) 500 MG tablet Take 500 mg by mouth daily.    . vitamin E 1000 UNIT capsule Take 1,000 Units by mouth at bedtime.    . fluticasone furoate-vilanterol (BREO ELLIPTA) 100-25 MCG/INH AEPB Inhale 1 puff into the lungs daily. (Patient not taking: Reported on 09/11/2019) 14 each 0   No facility-administered medications prior to visit.    PAST MEDICAL HISTORY: Past Medical History:  Diagnosis Date  . Abnormal gait   . Amnesia   . Anemia    hx  . Aneurysm ([place])   . Anxiety   . Asthma   .  Atrophic vaginitis   . Benign hypertensive heart disease   . Cardiomegaly   . Chest pain   . COPD (chronic obstructive pulmonary disease) ([place])     - PFTs  01/24/06 FEV1 69% ratio 68% diffusing capacity 57% with no improvement after B2   - PFT's 10/1/ 09 FEV1 77   ratio  76    dlco                   45            no resp to B2    - Nl alpha one antitripsin level 10/09  . Cough   . Depression   . Difficulty speaking   . Disorder of nasal cavity   . Dyspnea   . Enthesopathy of elbow region   . GERD (gastroesophageal reflux disease)   . Hammer toe   . Headache(784.0)   . Hemiparesis affecting dominant side as late effect of cerebrovascular accident ([place])   . History of kidney stones   . Hyperlipidemia   . Hypertension   . Hypothyroidism   . Increased urinary frequency   . Low compliance bladder   . Macular degeneration of both eyes   . Malaise and fatigue   . Nonruptured cerebral aneurysm   . Obesity   . Pneumonia    hx  . Pure hypercholesterolemia   . Recurrent major depressive episodes ([place])   . Seizure disorder, secondary ([place])    2-3 focal siezures monthly  . Seizures ([place])   . Short-term memory loss    since stroke  . Skin sensation disturbance   . Stroke [facility]) 2011   rt sided weakness  . Swelling of limb   . TMJ (temporomandibular  joint disorder)   . Urinary tract infectious disease   . Vertigo as late effect of stroke   . Vitamin D deficiency     PAST SURGICAL HISTORY: Past Surgical History:  Procedure Laterality Date  . BACK SURGERY    . CEREBRAL ANGIOGRAM     x3  . COLONOSCOPY WITH PROPOFOL N/A 08/18/2016   Procedure: COLONOSCOPY WITH PROPOFOL;  Surgeon: Carol Ada, MD;  Location: [facility];  Service: Endoscopy;  Laterality: N/A;  . LAPAROSCOPIC HYSTERECTOMY    . MANDIBLE FRACTURE SURGERY    . RADIOLOGY WITH ANESTHESIA N/A 08/21/2013   Procedure: RADIOLOGY WITH ANESTHESIA;  Surgeon: Rob Hickman, MD;  Location: [facility];  Service: Radiology;  Laterality: N/A;  . stones     no surgery    FAMILY HISTORY: Family History  Problem Relation Age of Onset  . Heart disease Mother   . Heart disease Father   . Heart attack Father   . Cancer Father        prostate to lymph node  . Lung cancer Other   . Breast cancer Neg Hx     SOCIAL HISTORY: Social History   Socioeconomic History  . Marital status: Married    Spouse name: Jori Moll  . Number of children: 2  . Years of education: 15  . Highest education level: Not on file  Occupational History  . 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 Smoking status: Former Smoker    Packs/day: 2.00    Types: Cigarettes    Quit date: 11/27/2009    Years since quitting: 9.8  . Smokeless tobacco: Never Used  . Tobacco comment: smoked 2.5 ppd x 5 years, 1ppd x 20+ years  Substance  and Sexual Activity  . Alcohol use: No    Alcohol/week: 0.0 standard drinks    Comment: patient quit alcohol use 2005.   . Drug use: No  . Sexual activity: Not Currently  Other Topics Concern  . Not on file  Social History Narrative   Patient lives at home with her [name] Jori Moll).   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    Patient is on disability.    Patient has an Associates degree.    Patient is right-handed.   Patient drinks 2 cups of green tea most days.               Social Determinants of Health   Financial Resource Strain: Low Risk   . Difficulty of Paying Living Expenses: Not hard at all  Food Insecurity: No Food Insecurity  . Worried About Charity fundraiser in the Last Year: Never true  . Ran Out of Food in the Last Year: Never true  Transportation Needs: No Transportation Needs  . Lack of Transportation (Medical): No  . Lack of Transportation (Non-Medical): No  Physical Activity: Inactive  . Days of Exercise per Week: 0 days  . Minutes of Exercise per Session: 0 min  Stress: No Stress Concern Present  . Feeling of Stress : Only a little  Social Connections:   . Frequency of Communication with Friends and Family: Not on file  . Frequency of Social Gatherings with Friends and Family: Not on file  . Attends Religious Services: Not on file  . Active Member of Clubs or Organizations: Not on file  . Attends Archivist Meetings: Not on file  . Marital Status: Not on file  Intimate Partner Violence:   . Fear of Current or Ex-Partner: Not on file  . Emotionally Abused: Not on file  . Physically Abused: Not on file  . Sexually Abused: Not on file     PHYSICAL EXAM  Vitals:   09/23/19 1049  BP: 128/76  Pulse: 87  Temp: 97.6 F (36.4 C)  Weight: 167 lb 3.2 oz (75.8 kg)  Height: 5\' 2"  (1.575 m)   Body mass index is 30.58 kg/m.  Generalized: Well developed, pleasant middle-age Caucasian female, in no acute distress  Head: normocephalic and atraumatic,. Oropharynx benign  Neck: Supple, no carotid bruits  Cardiac: Regular rate rhythm, no murmur  Skin no rash few bruises  Neurological examination   Mentation: Alert oriented to time, place, history taking. Follows all commands speech and language fluent    Cranial nerve II-XII: Pupils were equal round reactive to light extraocular movements were full, visual field were full on  confrontational test. Facial sensation and strength were normal. hearing was intact to finger rubbing bilaterally. Uvula tongue midline. head turning and shoulder shrug were normal and symmetric.Tongue protrusion into cheek strength was normal. Motor: Mild 4/5 right-sided hemiparesis with distal more than proximal weakness increased tone on the right upper and lower extremity. Right foot drop. AFO  Right lower leg as well as mild bilateral hip flexor weakness Sensory: Touch and pinprick are normal on the left mildly decreased on the right, vibratory is decreased on the right lower extremity  Coordination: Impaired on the right normal on the left  Reflexes: Brachioradialis 2/2, biceps 2/2, triceps 2/2, patellar 2/2, Achilles 1/1, plantar responses were flexor bilaterally. Gait and Station: Rising up from seated position without assistance, wide based gait right foot drop using  single-point cane with mild imbalance, unable to tandem  DIAGNOSTIC DATA (LABS, IMAGING, TESTING) - I reviewed patient records, labs, notes, testing and imaging myself where available.  Lab Results  Component Value Date   WBC 5.3 05/28/2015   HGB 11.8 (L) 05/28/2015   HCT 37.2 05/28/2015   MCV 94.7 05/28/2015   PLT 182 05/28/2015      Component Value Date/Time   NA 142 06/11/2019 1452   K 4.4 06/11/2019 1452   CL 105 06/11/2019 1452   CO2 23 06/11/2019 1452   GLUCOSE 88 06/11/2019 1452   GLUCOSE 94 05/28/2015 1302   BUN 11 06/11/2019 1452   CREATININE 0.69 06/11/2019 1452   CALCIUM 9.5 06/11/2019 1452   PROT 6.8 06/11/2019 1452   ALBUMIN 4.3 06/11/2019 1452   AST 24 06/11/2019 1452   ALT 17 06/11/2019 1452   ALKPHOS 124 (H) 06/11/2019 1452   BILITOT 0.4 06/11/2019 1452   GFRNONAA 94 06/11/2019 1452   GFRAA 108 06/11/2019 1452         ASSESSMENT AND PLAN 64y.o. year old female has a past medical history of left frontoparietal parenchymal hemorrhage March 2011 from PRES and malignant hypertension  with residual right spastic hemiparesis. Chronic headaches with history of migraines  And tension headaches improved.Placement of pipeline stent across Left PICA aneurysm by Dr. Estanislado Pandy on 08/21/2013. History of seizure disorder stable on Keppra.  Also concern regarding short-term memory   Continue aspirin, Plavix and Crestor for secondary stroke prevention  Continue Keppra 500 mg twice daily for seizure prophylaxis Due to stabilization of headaches and possible side effect of decreased memory, recommend trialing decrease Topamax dose from total daily dose to 75 mg to 25 mg twice daily -advised to notify office with any worsening headaches Continue to follow with PCP for HTN and HLD management Referral placed to outpatient PT due to slightly worsening deficits in the bilateral hip flexor weakness likely secondary to deconditioning and lack of physical activity.  Advised to use cane at all times for fall prevention Continue yearly follow-up with Dr. Estanislado Pandy -has appointment next week  Follow-up in 6 months or call earlier if needed   I spent 35 min  in total face to face time with the patient more than 50% of which was spent counseling and coordination of care, reviewing recent sleep study results and discussion regarding initiation of nightly oxygen, discussion regarding prior stroke deficits and ongoing management of HTN and HLD, discussion regarding ongoing use of AED for seizure prevention and ongoing use of Topamax for migraine prevention and answered all questions to patient satisfaction  Frann Rider, AGNP-BC  [facility] [address] [place], [place] [address]  Phone [phone] Fax [phone] Note: This document was prepared with digital dictation and possible smart phrase technology. Any transcriptional errors that result from this process are unintentional.

## 2019-09-24 NOTE — Progress Notes (Signed)
I agree with the above plan 

## 2019-09-26 ENCOUNTER — Other Ambulatory Visit: Payer: Self-pay | Admitting: *Deleted

## 2019-09-26 ENCOUNTER — Telehealth: Payer: Self-pay

## 2019-09-26 MED ORDER — TOPIRAMATE 25 MG PO TABS: 25.0000 mg | ORAL_TABLET | Freq: Two times a day (BID) | ORAL | 2 refills | Status: DC

## 2019-10-01 ENCOUNTER — Telehealth: Payer: Self-pay | Admitting: Pulmonary Disease

## 2019-10-01 NOTE — Telephone Encounter (Signed)
Called and spoke with pt letting her know that we had no control over the waitlist for the covid vaccine and pt verbalized understanding. Nothing further needed.

## 2019-10-02 ENCOUNTER — Other Ambulatory Visit: Payer: Self-pay

## 2019-10-02 ENCOUNTER — Ambulatory Visit (HOSPITAL_COMMUNITY): Payer: Medicare Other

## 2019-10-02 ENCOUNTER — Ambulatory Visit (HOSPITAL_COMMUNITY)
Admission: RE | Admit: 2019-10-02 | Discharge: 2019-10-02 | Disposition: A | Discharge status: Home / Self Care | Payer: Medicare Other | Source: Ambulatory Visit | Attending: Interventional Radiology | Admitting: Interventional Radiology

## 2019-10-02 DIAGNOSIS — I671 Cerebral aneurysm, nonruptured: Secondary | ICD-10-CM | POA: Insufficient documentation

## 2019-10-04 ENCOUNTER — Other Ambulatory Visit: Payer: Self-pay | Admitting: Internal Medicine

## 2019-10-07 ENCOUNTER — Telehealth (HOSPITAL_COMMUNITY): Payer: Self-pay

## 2019-10-07 NOTE — Telephone Encounter (Signed)
Pt agreed to f/u in 1 year with [facility]ra head wo. AW  

## 2019-10-07 NOTE — Telephone Encounter (Signed)
Alprazolam refill 

## 2019-10-11 DIAGNOSIS — Z79899 Other long term (current) drug therapy: Secondary | ICD-10-CM | POA: Diagnosis not present

## 2019-10-11 DIAGNOSIS — H10413 Chronic giant papillary conjunctivitis, bilateral: Secondary | ICD-10-CM | POA: Diagnosis not present

## 2019-10-11 DIAGNOSIS — H04123 Dry eye syndrome of bilateral lacrimal glands: Secondary | ICD-10-CM | POA: Diagnosis not present

## 2019-10-11 DIAGNOSIS — H524 Presbyopia: Secondary | ICD-10-CM | POA: Diagnosis not present

## 2019-10-11 DIAGNOSIS — H52223 Regular astigmatism, bilateral: Secondary | ICD-10-CM | POA: Diagnosis not present

## 2019-10-12 ENCOUNTER — Other Ambulatory Visit (HOSPITAL_COMMUNITY)
Admission: RE | Admit: 2019-10-12 | Discharge: 2019-10-12 | Disposition: A | Discharge status: Home / Self Care | Payer: Medicare Other | Source: Ambulatory Visit | Attending: Pulmonary Disease | Admitting: Pulmonary Disease

## 2019-10-12 DIAGNOSIS — Z20822 Contact with and (suspected) exposure to covid-19: Secondary | ICD-10-CM | POA: Diagnosis not present

## 2019-10-12 DIAGNOSIS — Z01812 Encounter for preprocedural laboratory examination: Secondary | ICD-10-CM | POA: Diagnosis not present

## 2019-10-12 LAB — SARS CORONAVIRUS 2 (TAT 6-24 HRS): SARS Coronavirus 2: NEGATIVE

## 2019-10-15 ENCOUNTER — Ambulatory Visit (INDEPENDENT_AMBULATORY_CARE_PROVIDER_SITE_OTHER): Payer: Medicare Other | Admitting: Pulmonary Disease

## 2019-10-15 ENCOUNTER — Other Ambulatory Visit: Payer: Self-pay

## 2019-10-15 DIAGNOSIS — J449 Chronic obstructive pulmonary disease, unspecified: Secondary | ICD-10-CM | POA: Diagnosis not present

## 2019-10-15 LAB — PULMONARY FUNCTION TEST
DL/VA % pred: 86 %
DL/VA: 3.68 ml/min/mmHg/L
DLCO unc % pred: 69 %
DLCO unc: 13.47 ml/min/mmHg
FEF 25-75 Post: 1.43 L/sec
FEF 25-75 Pre: 0.99 L/sec
FEF2575-%Change-Post: 44 %
FEF2575-%Pred-Post: 64 %
FEF2575-%Pred-Pre: 44 %
FEV1-%Change-Post: 9 %
FEV1-%Pred-Post: 64 %
FEV1-%Pred-Pre: 58 %
FEV1-Post: 1.54 L
FEV1-Pre: 1.4 L
FEV1FVC-%Change-Post: 2 %
FEV1FVC-%Pred-Pre: 93 %
FEV6-%Change-Post: 6 %
FEV6-%Pred-Post: 68 %
FEV6-%Pred-Pre: 64 %
FEV6-Post: 2.06 L
FEV6-Pre: 1.93 L
FEV6FVC-%Change-Post: 0 %
FEV6FVC-%Pred-Post: 103 %
FEV6FVC-%Pred-Pre: 103 %
FVC-%Change-Post: 6 %
FVC-%Pred-Post: 65 %
FVC-%Pred-Pre: 61 %
FVC-Post: 2.06 L
FVC-Pre: 1.93 L
Post FEV1/FVC ratio: 75 %
Post FEV6/FVC ratio: 100 %
Pre FEV1/FVC ratio: 73 %
Pre FEV6/FVC Ratio: 100 %
RV % pred: 127 %
RV: 2.52 L
TLC % pred: 92 %
TLC: 4.55 L

## 2019-10-15 NOTE — Progress Notes (Addendum)
Full PFT performed today. °

## 2019-10-16 ENCOUNTER — Ambulatory Visit (INDEPENDENT_AMBULATORY_CARE_PROVIDER_SITE_OTHER): Payer: Medicare Other | Admitting: Pulmonary Disease

## 2019-10-16 ENCOUNTER — Encounter: Payer: Self-pay | Admitting: Pulmonary Disease

## 2019-10-16 VITALS — BP 102/62 | HR 85 | Temp 97.3°F | Ht 62.0 in | Wt 166.5 lb

## 2019-10-16 DIAGNOSIS — R0602 Shortness of breath: Secondary | ICD-10-CM

## 2019-10-16 DIAGNOSIS — G4734 Idiopathic sleep related nonobstructive alveolar hypoventilation: Secondary | ICD-10-CM | POA: Diagnosis not present

## 2019-10-16 DIAGNOSIS — J449 Chronic obstructive pulmonary disease, unspecified: Secondary | ICD-10-CM

## 2019-10-16 NOTE — Patient Instructions (Signed)
Continue oxygen use at night  Continue albuterol as needed  Continue graded exercises  I will see you back in about 6 months Call with any significant concerns

## 2019-10-16 NOTE — Progress Notes (Signed)
Subjective:    Patient ID: [name], female    DOB: 05/07/1956, 64 y.o.   MRN: [id number]  Patient being seen for difficulty sleeping, headaches in the morning  Sleep study recently only significant for oxygen desaturations  Patient does have a history of COPD in the past Quit smoking in 2011  She did have a brain bleed in 2011-2 events, had stents placed Prolonged recovery  Has been feeling relatively stable She is on oxygen supplementation at night She recently had a PFT performed showing small airway obstruction with significant bronchodilator response  There may be a component of deconditioning as she has not been very active since Covid  She remembers being on oxygen prior to her stroke, remembers a diagnosis of COPD/emphysema She was using inhalers and nebulizers prior to that  Currently able to ambulate, does get short of breath with exertion Uses a walker, unsteady, if she goes slowly she is able to take of 100 steps  She does have daytime fatigue and sleepiness Multiple medications may be contributing to this   Comorbidities include hypertension, hypercholesterolemia, chronic headaches, CVA    Past Medical History:  Diagnosis Date  . Abnormal gait   . Amnesia   . Anemia    hx  . Aneurysm ([place])   . Anxiety   . Asthma   . Atrophic vaginitis   . Benign hypertensive heart disease   . Cardiomegaly   . Chest pain   . COPD (chronic obstructive pulmonary disease) ([place])     - PFTs  01/24/06 FEV1 69% ratio 68% diffusing capacity 57% with no improvement after B2   - PFT's 10/1/ 09 FEV1 77   ratio  76    dlco                   45            no resp to B2    - Nl alpha one antitripsin level 10/09  . Cough   . Depression   . Difficulty speaking   . Disorder of nasal cavity   . Dyspnea   . Enthesopathy of elbow region   . GERD (gastroesophageal reflux disease)   . Hammer toe   . Headache(784.0)   . Hemiparesis affecting dominant side as late effect of  cerebrovascular accident ([place])   . History of kidney stones   . Hyperlipidemia   . Hypertension   . Hypothyroidism   . Increased urinary frequency   . Low compliance bladder   . Macular degeneration of both eyes   . Malaise and fatigue   . Nonruptured cerebral aneurysm   . Obesity   . Pneumonia    hx  . Pure hypercholesterolemia   . Recurrent major depressive episodes ([place])   . Seizure disorder, secondary ([place])    2-3 focal siezures monthly  . Seizures ([place])   . Short-term memory loss    since stroke  . Skin sensation disturbance   . Stroke [facility]) 2011   rt sided weakness  . Swelling of limb   . TMJ (temporomandibular joint disorder)   . Urinary tract infectious disease   . Vertigo as late effect of stroke   . Vitamin D deficiency    Social History   Socioeconomic History  . Marital status: Married    Spouse name: Jori Moll  . Number of children: 2  . Years of education: 71  . Highest education level: Not on file  Occupational History  .  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 Smoking status: Former Smoker    Packs/day: 2.00    Years: 30.00    Pack years: 60.00    Types: Cigarettes    Quit date: 11/27/2009    Years since quitting: 9.8  . Smokeless tobacco: Never Used  . Tobacco comment: smoked 2.5 ppd x 5 years, 1ppd x 20+ years  Substance and Sexual Activity  . Alcohol use: No    Alcohol/week: 0.0 standard drinks    Comment: patient quit alcohol use 2005.   . Drug use: No  . Sexual activity: Not Currently  Other Topics Concern  . Not on file  Social History Narrative   Patient lives at home with her husband Jori Moll).   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    Patient is on disability.    Patient has an Associates degree.   Patient is right-handed.   Patient drinks 2 cups of green tea most days.               Social Determinants of Health   Financial Resource Strain:  Low Risk   . Difficulty of Paying Living Expenses: Not hard at all  Food Insecurity: No Food Insecurity  . Worried About Charity fundraiser in the Last Year: Never true  . Ran Out of Food in the Last Year: Never true  Transportation Needs: No Transportation Needs  . Lack of Transportation (Medical): No  . Lack of Transportation (Non-Medical): No  Physical Activity: Inactive  . Days of Exercise per Week: 0 days  . Minutes of Exercise per Session: 0 min  Stress: No Stress Concern Present  . Feeling of Stress : Only a little  Social Connections:   . Frequency of Communication with Friends and Family: Not on file  . Frequency of Social Gatherings with Friends and Family: Not on file  . Attends Religious Services: Not on file  . Active Member of Clubs or Organizations: Not on file  . Attends Archivist Meetings: Not on file  . Marital Status: Not on file  Intimate Partner Violence:   . Fear of Current or Ex-Partner: Not on file  . Emotionally Abused: Not on file  . Physically Abused: Not on file  . Sexually Abused: Not on file   Family History  Problem Relation Age of Onset  . Heart disease Mother   . Heart disease Father   . Heart attack Father   . Cancer Father        prostate to lymph node  . Lung cancer Other   . Breast cancer Neg Hx    Review of Systems  Constitutional: Negative for fever and unexpected weight change.  HENT: Positive for dental problem, nosebleeds and sneezing. Negative for congestion, ear pain, postnasal drip, rhinorrhea, sinus pressure, sore throat and trouble swallowing.   Eyes: Negative for redness and itching.  Respiratory: Positive for cough and shortness of breath. Negative for chest tightness and wheezing.   Cardiovascular: Positive for leg swelling. Negative for palpitations.  Gastrointestinal: Negative for nausea and vomiting.  Genitourinary: Negative for dysuria.  Musculoskeletal: Positive for joint swelling.  Skin: Negative for  rash.  Allergic/Immunologic: Negative.  Negative for environmental allergies, food allergies and immunocompromised state.  Neurological: Positive for headaches.  Hematological: Bruises/bleeds easily.  Psychiatric/Behavioral: Negative for dysphoric mood. The patient is nervous/anxious.  Objective:   Physical Exam Constitutional:      Appearance: Normal appearance.  HENT:     Head: Normocephalic and atraumatic.     Nose: Nose normal. No congestion.  Eyes:     Pupils: Pupils are equal, round, and reactive to light.  Cardiovascular:     Rate and Rhythm: Normal rate and regular rhythm.     Pulses: Normal pulses.     Heart sounds: Normal heart sounds. No murmur. No friction rub.  Pulmonary:     Effort: Pulmonary effort is normal. No respiratory distress.     Breath sounds: Normal breath sounds. No stridor. No wheezing or rhonchi.  Musculoskeletal:     Cervical back: Normal range of motion and neck supple. No rigidity or tenderness.  Neurological:     Mental Status: She is alert.    Vitals:   10/16/19 1213  BP: 102/62  Pulse: 85  Temp: (!) 97.3 F (36.3 C)  SpO2: 95%   Results of the Epworth flowsheet 08/21/2019  Sitting and reading 2  Watching TV 2  Sitting, inactive in a public place (e.g. a theatre or a meeting) 2  As a passenger in a car for an hour without a break 3  Lying down to rest in the afternoon when circumstances permit 3  Sitting and talking to someone 0  Sitting quietly after a lunch without alcohol 2  In a car, while stopped for a few minutes in traffic 0  Total score 14   Recent CT scan of the chest was reviewed with the patient showing some emphysematous changes  Recent sleep study reviewed-nocturnal desaturations  PFT from 10/15/2019 reveals small airway disease-this was personally reviewed with the patient  Nocturnal oximetry data reveal desaturations for which she was started on oxygen supplementation     Assessment & Plan:  .  Patient with  past history of COPD .  Oxygen supplementation in the past -She resumed oxygen supplementation nocturnally  .  Shortness of breath with activity .  Daytime sleepiness -Negative study for significant sleep disordered breathing -Study did reveal nocturnal desaturations for which he is on oxygen supplementation  .  She did stop Breo as she was having side effects .  She is only on albuterol which seems to be helping symptoms, does not need to use albuterol daily  .  I will see her back in about 6 months  .  Encouraged to call if any significant concerns or symptoms

## 2019-10-17 ENCOUNTER — Ambulatory Visit: Payer: Self-pay

## 2019-10-21 ENCOUNTER — Other Ambulatory Visit: Payer: Self-pay

## 2019-10-21 DIAGNOSIS — E039 Hypothyroidism, unspecified: Secondary | ICD-10-CM

## 2019-10-21 MED ORDER — LEVOTHYROXINE SODIUM 75 MCG PO TABS: ORAL_TABLET | ORAL | 1 refills | Status: DC

## 2019-10-24 ENCOUNTER — Encounter: Payer: Self-pay | Admitting: Pulmonary Disease

## 2019-10-29 DIAGNOSIS — H353122 Nonexudative age-related macular degeneration, left eye, intermediate dry stage: Secondary | ICD-10-CM | POA: Diagnosis not present

## 2019-10-29 DIAGNOSIS — H35033 Hypertensive retinopathy, bilateral: Secondary | ICD-10-CM | POA: Diagnosis not present

## 2019-10-29 DIAGNOSIS — H353211 Exudative age-related macular degeneration, right eye, with active choroidal neovascularization: Secondary | ICD-10-CM | POA: Diagnosis not present

## 2019-11-02 ENCOUNTER — Other Ambulatory Visit: Payer: Self-pay | Admitting: Internal Medicine

## 2019-11-06 ENCOUNTER — Telehealth: Payer: Self-pay

## 2019-11-06 ENCOUNTER — Telehealth: Payer: Medicare Other

## 2019-11-06 ENCOUNTER — Ambulatory Visit: Payer: Self-pay

## 2019-11-06 ENCOUNTER — Other Ambulatory Visit: Payer: Self-pay

## 2019-11-06 DIAGNOSIS — J449 Chronic obstructive pulmonary disease, unspecified: Secondary | ICD-10-CM

## 2019-11-06 DIAGNOSIS — I10 Essential (primary) hypertension: Secondary | ICD-10-CM

## 2019-11-06 DIAGNOSIS — E039 Hypothyroidism, unspecified: Secondary | ICD-10-CM

## 2019-11-06 DIAGNOSIS — H3530 Unspecified macular degeneration: Secondary | ICD-10-CM

## 2019-11-06 DIAGNOSIS — F331 Major depressive disorder, recurrent, moderate: Secondary | ICD-10-CM

## 2019-11-07 NOTE — Chronic Care Management (AMB) (Signed)
  Chronic Care Management   Outreach Note  11/07/2019 Name: DEVINE BOBROWSKI MRN: [id number] DOB: Oct 23, 1955  Referred by: Glendale Chard, MD Reason for referral : Chronic Care Management ([facility] Initial Call - HTN, Hypothyroidism, depression macular degen of rt eye)   An unsuccessful telephone outreach was attempted today. The patient was referred to the case management team for assistance with care management and care coordination.   Follow Up Plan: A HIPPA compliant phone message was left for the patient providing contact information and requesting a return call.  Telephone follow up appointment with care management team member scheduled for: 12/03/19  Barb Merino, RN, BSN, CCM Care Management Coordinator [place] [facility]  Direct Phone: [phone]

## 2019-11-14 ENCOUNTER — Telehealth: Payer: Self-pay | Admitting: Pulmonary Disease

## 2019-11-14 DIAGNOSIS — J449 Chronic obstructive pulmonary disease, unspecified: Secondary | ICD-10-CM

## 2019-11-14 MED ORDER — ALBUTEROL SULFATE HFA 108 (90 BASE) MCG/ACT IN AERS: 2.0000 | INHALATION_SPRAY | Freq: Four times a day (QID) | RESPIRATORY_TRACT | 1 refills | Status: DC | PRN

## 2019-11-14 MED ORDER — ALBUTEROL SULFATE (2.5 MG/3ML) 0.083% IN NEBU: 2.5000 mg | INHALATION_SOLUTION | Freq: Four times a day (QID) | RESPIRATORY_TRACT | 1 refills | Status: DC

## 2019-11-14 NOTE — Telephone Encounter (Signed)
Pt also added right before hanging up she wanted specifically to receive her prescriptions in three month quantities because the price is better.-- Also she clarified she would like to go ahead and have the prescription she just received to be resent so she could get the two additional months of the supply.

## 2019-11-14 NOTE — Telephone Encounter (Signed)
Spoke with pt, she would like 3 month supply of albuterol inhaler and [facility]. Nothing further is needed.

## 2019-12-03 ENCOUNTER — Other Ambulatory Visit: Payer: Self-pay

## 2019-12-03 ENCOUNTER — Telehealth: Payer: Medicare Other

## 2019-12-03 ENCOUNTER — Ambulatory Visit (INDEPENDENT_AMBULATORY_CARE_PROVIDER_SITE_OTHER): Payer: Medicare Other

## 2019-12-03 DIAGNOSIS — E039 Hypothyroidism, unspecified: Secondary | ICD-10-CM | POA: Diagnosis not present

## 2019-12-03 DIAGNOSIS — H3530 Unspecified macular degeneration: Secondary | ICD-10-CM

## 2019-12-03 DIAGNOSIS — F331 Major depressive disorder, recurrent, moderate: Secondary | ICD-10-CM

## 2019-12-03 DIAGNOSIS — I10 Essential (primary) hypertension: Secondary | ICD-10-CM | POA: Diagnosis not present

## 2019-12-03 DIAGNOSIS — J449 Chronic obstructive pulmonary disease, unspecified: Secondary | ICD-10-CM

## 2019-12-05 NOTE — Patient Instructions (Signed)
Visit Information  Goals Addressed            This Visit's Progress     Patient Stated   . "to improve my mood" (pt-stated)       CARE PLAN ENTRY (see longtitudinal plan of care for additional care plan information)  Current Barriers:  Marland Kitchen Knowledge Deficits related to disease process and Self Health management of depression and anxiety . Chronic Disease Management support and education needs related to Essential HTN, Moderate episode of recurrent major depressive disorder, COPD, Macular Degeneration of right eye  Nurse Case Manager Clinical Goal(s):  Marland Kitchen Over the next 90 days, patient will work with the [place] CM and PCP to address needs related to disease education and support for improved Self Health management of depression and anxiety  CCM RN CM Interventions:  12/04/19 call completed with patient  . Evaluation of current treatment plan related to depression and anxiety and patient's adherence to plan as established by provider. . Provided education to patient re: basic disease process, evaluation and treatment of depression and or anxiety . Reviewed medications with patient and discussed indication, dosage and frequency of prescribed antidepressant; patient feels the increase in her Celexa to 40 mg is effective and improving her mood; Discussed and reviewed that Mrs. Safi is titrating off her Topamax; Education provided regarding the black box warning for Topamax causing or worsening depression  . Discussed plans with patient for ongoing care management follow up and provided patient with direct contact information for care management team . Provided patient with printed educational materials related to Mood and Your Health  Patient Self Care Activities:  . Self administers medications as prescribed . Attends all scheduled provider appointments . Calls pharmacy for medication refills . Performs ADL's independently . Performs IADL's independently . Calls provider office  for new concerns or questions  Initial goal documentation     . "to improve the vision in my right eye" (pt-stated)       [place] (see longtitudinal plan of care for additional care plan information)  Current Barriers:  Marland Kitchen Knowledge Deficits related to disease process and Self Health management of Macular Degeneration to both eyes . Chronic Disease Management support and education needs related to Essential HTN, COPD, Macular degeneration, depression   Nurse Case Manager Clinical Goal(s):  Marland Kitchen Over the next 90 days, patient will work with CCM RN CM and PCP to address needs related to disease education and support to improve Self Health management of Macular Degeneration   CCM RN CM Interventions:  12/04/19 call completed with patient  . Evaluation of current treatment plan related to Macular Degeneration  and patient's adherence to plan as established by provider. . Provided education to patient re: basic disease process and treatment recommendations for this condition; Determined patient is receiving eye injections per Dr. Ander Slade and reports getting good effectiveness with improved vision to her right eye  . Discussed plans with patient for ongoing care management follow up and provided patient with direct contact information for care management team . Provided patient with printed educational materials related to Macular Degeneration   Patient Self Care Activities:  . Self administers medications as prescribed . Attends all scheduled provider appointments . Calls pharmacy for medication refills . Performs ADL's independently . Performs IADL's independently . Calls provider office for new concerns or questions  Initial goal documentation     . "to keep my Asthma under good control" (pt-stated)  CARE PLAN ENTRY (see longtitudinal plan of care for additional care plan information)  Current Barriers:  Marland Kitchen Knowledge Deficits related to evaluation and treatment of  COPD/Asthma . Chronic Disease Management support and education needs related to Essential HTN, Moderate episode of recurrent major depressive disorder, COPD, Macular Degeneration of right eye  Nurse Case Manager Clinical Goal(s):  Marland Kitchen Over the next 90 days, patient will work with the CCM team and Pulmonology to address needs related to disease education and support for improved Self Health management of COPD/Asthma  CCM RN CM Interventions:  12/04/19 call completed with patient  . Evaluation of current treatment plan related to Asthma and COPD and patient's adherence to plan as established by provider. . Provided education to patient re: basic disease process, evaluation and treatment recommendations for Asthma and COPD . Reviewed medications with patient and discussed patient is currently only using Albuterol via nebulizer for relief of cough and shortness of breath; Patient is being followed by Pulmonology for COPD . Discussed plans with patient for ongoing care management follow up and provided patient with direct contact information for care management team . Provided patient with printed educational materials related to COPD Zone Safety Tool; Asthma and COPD  Patient Self Care Activities:  . Self administers medications as prescribed . Attends all scheduled provider appointments . Calls pharmacy for medication refills . Performs ADL's independently . Performs IADL's independently . Calls provider office for new concerns or questions  Initial goal documentation     . "to keep my BP under good control" (pt-stated)       CARE PLAN ENTRY (see longtitudinal plan of care for additional care plan information)  Current Barriers:  Marland Kitchen Knowledge Deficits related to disease education and Self Health management of HTN . Chronic Disease Management support and education needs related to Essential HTN, Moderate episode of recurrent major depressive disorder, COPD, Macular Degeneration of right  eye  Nurse Case Manager Clinical Goal(s):  Marland Kitchen Over the next 90 days, patient will work with CCM RN CM and PCP to address needs related to disease education and support improved Self Health management of HTN   CCM RN CM Interventions:  12/05/19 completed with patient  . Evaluation of current treatment plan related to Essential HTN and patient's adherence to plan as established by provider. . Provided education to patient re: High blood pressure is a systolic pressure of [id number] or higher,or a diastolic pressure of 80 or higher, that stays high over time; Educated on target BP <130/80 and best ways to achieve and maintain this BP via MD adherence to diet, exercise, maintaining idea body weight, no smoking and low stress life style and medication adherence . Reviewed medications with patient and discussed indication, dosage and frequency of prescribed antihypertensives; patient denies having noted SE . Discussed plans with patient for ongoing care management follow up and provided patient with direct contact information for care management team . Advised patient, providing education and rationale, to monitor blood pressure daily and record, calling the CCM team and or PCP for findings outside established parameters.  . Provided patient with printed educational materials related to What is High Blood Pressure?; Why Should I Lower Sodium?; High Blood Pressure and Stroke; Life's Simple 7  Patient Self Care Activities:  . Self administers medications as prescribed . Attends all scheduled provider appointments . Calls pharmacy for medication refills . Performs ADL's independently . Performs IADL's independently . Calls provider office for new concerns or questions  Initial goal documentation  Patient verbalizes understanding of instructions provided today.   Telephone follow up appointment with care management team member scheduled for:  Barb Merino, RN, BSN, CCM Care Management  Coordinator [place] [facility]  Direct Phone: [phone]

## 2019-12-05 NOTE — Chronic Care Management (AMB) (Signed)
Chronic Care Management   Initial Visit Note  12/04/2019 Name: ATZIRY MARKEY MRN: [id number] DOB: Oct 24, 1955  Referred by: Glendale Chard, MD Reason for referral : Chronic Care Management (RQ #2 Initial RN Call)   [name] is a 64 y.o. year old female who is a primary care patient of Glendale Chard, MD. The CCM team was consulted for assistance with chronic disease management and care coordination needs related to HTN, COPD, Depression and Macular Degeneration   Review of patient status, including review of consultants reports, relevant laboratory and other test results, and collaboration with appropriate care team members and the patient's provider was performed as part of comprehensive patient evaluation and provision of chronic care management services.    SDOH (Social Determinants of Health) assessments performed: Yes - no SDOH issues identified  See Care Plan activities for detailed interventions related to [place] initial outbound CCM RN CCM call to patient to assess for CCM needs, a care plan was established.     Medications: Outpatient Encounter Medications as of 12/03/2019  Medication Sig Note  . citalopram (CELEXA) 40 MG tablet Take 1 tablet (40 mg total) by mouth at bedtime.   Marland Kitchen albuterol (PROVENTIL) (2.5 MG/3ML) 0.083% nebulizer solution Take 3 mLs (2.5 mg total) by nebulization every 6 (six) hours.   Marland Kitchen albuterol (VENTOLIN HFA) 108 (90 Base) MCG/ACT inhaler Inhale 2 puffs into the lungs every 6 (six) hours as needed for wheezing or shortness of breath.   . ALPRAZolam (XANAX) 0.25 MG tablet TAKE 1 TABLET TWICE A DAY AS NEEDED   . aspirin EC 81 MG tablet Take 81 mg by mouth daily.   . Cholecalciferol (VITAMIN D-3) 1000 units CAPS Take 1,000 Units by mouth daily.   . clopidogrel (PLAVIX) 75 MG tablet TAKE 1 TABLET DAILY   . CRANBERRY PO Take 2 tablets by mouth 2 (two) times daily. 09/18/2018: 09/18/18 take one tab two x daily  . cyclobenzaprine (FLEXERIL)  10 MG tablet TAKE 1 TABLET AT BEDTIME AS NEEDED   . diclofenac sodium (VOLTAREN) 1 % GEL Apply 2 g topically 3 (three) times daily as needed (for pain).    . fluticasone (FLONASE) 50 MCG/ACT nasal spray Place 2 sprays into both nostrils daily as needed (allergies or post-nasl drip).  11/13/2017: New order; not yet started  . levETIRAcetam (KEPPRA) 500 MG tablet Take 1 tablet (500 mg total) by mouth every 12 (twelve) hours.   Marland Kitchen levothyroxine (SYNTHROID) 50 MCG tablet TAKE 1 TABLET ON MONDAY THROUGH WEDNESDAY   . levothyroxine (SYNTHROID) 75 MCG tablet Take 75 mcg by mouth in the morning before breakfast on Thursday - Sunday , Take 50 mcg on other days   . losartan (COZAAR) 100 MG tablet Take 100 mg by mouth every morning.    . Multiple Vitamins-Minerals (ICAPS AREDS 2 PO) Take 1 capsule by mouth daily.   . Omega-3 Fatty Acids (FISH OIL) 1000 MG CAPS Take 2,000 mg by mouth 2 (two) times daily.    . rosuvastatin (CRESTOR) 10 MG tablet Take 1 tablet (10 mg total) by mouth daily.   Marland Kitchen topiramate (TOPAMAX) 25 MG tablet Take 1 tablet (25 mg total) by mouth 2 (two) times daily.   . vitamin C (ASCORBIC ACID) 500 MG tablet Take 500 mg by mouth daily.   . vitamin E 1000 UNIT capsule Take 1,000 Units by mouth at bedtime.    No facility-administered encounter medications on file as of 12/03/2019.  Objective:  Lab Results  Component Value Date   HGBA1C 5.9 (H) 06/11/2019   HGBA1C 5.8 (H) 01/02/2019   HGBA1C 5.8 (H) 09/03/2018   Lab Results  Component Value Date   MICROALBUR 30 09/11/2019   [place]  11/23/2009    91        Total Cholesterol/HDL:CHD Risk Coronary Heart Disease Risk Table                     Men   Women  1/2 Average Risk   3.4   3.3  Average Risk       5.0   4.4  2 X Average Risk   9.6   7.1  3 X Average Risk  23.4   11.0        Use the calculated Patient Ratio above and the CHD Risk Table to determine the patient's CHD Risk.        ATP III CLASSIFICATION (LDL):  <100      mg/dL   Optimal  100-129  mg/dL   Near or Above                    Optimal  130-159  mg/dL   Borderline  160-189  mg/dL   High  >190     mg/dL   Very High   CREATININE 0.69 06/11/2019   BP Readings from Last 3 Encounters:  10/16/19 102/62  09/23/19 128/76  09/11/19 122/76    Goals Addressed      Patient Stated   . "to improve my mood" (pt-stated)       [place] (see longtitudinal plan of care for additional care plan information)  Current Barriers:  Marland Kitchen Knowledge Deficits related to disease process and Self Health management of depression and anxiety . Chronic Disease Management support and education needs related to Essential HTN, Moderate episode of recurrent major depressive disorder, COPD, Macular Degeneration of right eye  Nurse Case Manager Clinical Goal(s):  Marland Kitchen Over the next 90 days, patient will work with the [facility] CM and PCP to address needs related to disease education and support for improved Self Health management of depression and anxiety  CCM RN CM Interventions:  12/04/19 call completed with patient  . Evaluation of current treatment plan related to depression and anxiety and patient's adherence to plan as established by provider. . Provided education to patient re: basic disease process, evaluation and treatment of depression and or anxiety . Reviewed medications with patient and discussed indication, dosage and frequency of prescribed antidepressant; patient feels the increase in her Celexa to 40 mg is effective and improving her mood; Discussed and reviewed that Mrs. Cardenas is titrating off her Topamax; Education provided regarding the black box warning for Topamax causing or worsening depression  . Discussed plans with patient for ongoing care management follow up and provided patient with direct contact information for care management team . Provided patient with printed educational materials related to Mood and Your Health  Patient Self Care  Activities:  . Self administers medications as prescribed . Attends all scheduled provider appointments . Calls pharmacy for medication refills . Performs ADL's independently . Performs IADL's independently . Calls provider office for new concerns or questions  Initial goal documentation     . "to improve the vision in my right eye" (pt-stated)       [place] (see longtitudinal plan of care for additional care plan information)  Current Barriers:  .  Knowledge Deficits related to disease process and Self Health management of Macular Degeneration to both eyes . Chronic Disease Management support and education needs related to Essential HTN, COPD, Macular degeneration, depression   Nurse Case Manager Clinical Goal(s):  Marland Kitchen Over the next 90 days, patient will work with CCM RN CM and PCP to address needs related to disease education and support to improve Self Health management of Macular Degeneration   CCM RN CM Interventions:  12/04/19 call completed with patient  . Evaluation of current treatment plan related to Macular Degeneration  and patient's adherence to plan as established by provider. . Provided education to patient re: basic disease process and treatment recommendations for this condition; Determined patient is receiving eye injections per Dr. Ander Slade and reports getting good effectiveness with improved vision to her right eye  . Discussed plans with patient for ongoing care management follow up and provided patient with direct contact information for care management team . Provided patient with printed educational materials related to Macular Degeneration   Patient Self Care Activities:  . Self administers medications as prescribed . Attends all scheduled provider appointments . Calls pharmacy for medication refills . Performs ADL's independently . Performs IADL's independently . Calls provider office for new concerns or questions  Initial goal documentation     .  "to keep my Asthma under good control" (pt-stated)       CARE PLAN ENTRY (see longtitudinal plan of care for additional care plan information)  Current Barriers:  Marland Kitchen Knowledge Deficits related to evaluation and treatment of COPD/Asthma . Chronic Disease Management support and education needs related to Essential HTN, Moderate episode of recurrent major depressive disorder, COPD, Macular Degeneration of right eye  Nurse Case Manager Clinical Goal(s):  Marland Kitchen Over the next 90 days, patient will work with the CCM team and Pulmonology to address needs related to disease education and support for improved Self Health management of COPD/Asthma  CCM RN CM Interventions:  12/04/19 call completed with patient  . Evaluation of current treatment plan related to Asthma and COPD and patient's adherence to plan as established by provider. . Provided education to patient re: basic disease process, evaluation and treatment recommendations for Asthma and COPD . Reviewed medications with patient and discussed patient is currently only using Albuterol via nebulizer for relief of cough and shortness of breath; Patient is being followed by Pulmonology for COPD . Discussed plans with patient for ongoing care management follow up and provided patient with direct contact information for care management team . Provided patient with printed educational materials related to COPD Zone Safety Tool; Asthma and COPD  Patient Self Care Activities:  . Self administers medications as prescribed . Attends all scheduled provider appointments . Calls pharmacy for medication refills . Performs ADL's independently . Performs IADL's independently . Calls provider office for new concerns or questions  Initial goal documentation     . "to keep my BP under good control" (pt-stated)       CARE PLAN ENTRY (see longtitudinal plan of care for additional care plan information)  Current Barriers:  Marland Kitchen Knowledge Deficits related to  disease education and Self Health management of HTN . Chronic Disease Management support and education needs related to Essential HTN, Moderate episode of recurrent major depressive disorder, COPD, Macular Degeneration of right eye  Nurse Case Manager Clinical Goal(s):  Marland Kitchen Over the next 90 days, patient will work with CCM RN CM and PCP to address needs related to disease education and support  improved Self Health management of HTN   CCM RN CM Interventions:  12/05/19 completed with patient  . Evaluation of current treatment plan related to Essential HTN and patient's adherence to plan as established by provider. . Provided education to patient re: High blood pressure is a systolic pressure of [id number] or higher,or a diastolic pressure of 80 or higher, that stays high over time; Educated on target BP <130/80 and best ways to achieve and maintain this BP via MD adherence to diet, exercise, maintaining idea body weight, no smoking and low stress life style and medication adherence . Reviewed medications with patient and discussed indication, dosage and frequency of prescribed antihypertensives; patient denies having noted SE . Discussed plans with patient for ongoing care management follow up and provided patient with direct contact information for care management team . Advised patient, providing education and rationale, to monitor blood pressure daily and record, calling the CCM team and or PCP for findings outside established parameters.  . Provided patient with printed educational materials related to What is High Blood Pressure?; Why Should I Lower Sodium?; High Blood Pressure and Stroke; Life's Simple 7  Patient Self Care Activities:  . Self administers medications as prescribed . Attends all scheduled provider appointments . Calls pharmacy for medication refills . Performs ADL's independently . Performs IADL's independently . Calls provider office for new concerns or questions  Initial goal  documentation        Plan:   Telephone follow up appointment with care management team member scheduled for: 01/01/20  Barb Merino, RN, BSN, CCM Care Management Coordinator [facility]  Direct Phone: [phone]

## 2019-12-10 ENCOUNTER — Telehealth: Payer: Self-pay

## 2019-12-10 ENCOUNTER — Ambulatory Visit: Payer: Self-pay

## 2019-12-10 DIAGNOSIS — I10 Essential (primary) hypertension: Secondary | ICD-10-CM

## 2019-12-10 DIAGNOSIS — F331 Major depressive disorder, recurrent, moderate: Secondary | ICD-10-CM

## 2019-12-10 NOTE — Chronic Care Management (AMB) (Signed)
  Chronic Care Management   Social Work Note  12/10/2019 Name: [name] MRN: [id number] DOB: 06/06/1956  [name] is a 64 y.o. year old female who sees Glendale Chard, MD for primary care. The CCM team was consulted for assistance with care coordination..   SW placed a successful outbound call to the patient to conduct an SDOH (social determinants of health) screen. The patient reported she was unable to complete the call at this time.     Follow Up Plan: SW will outreach the patient over the next day to complete call.  Daneen Schick, BSW, CDP Social Worker, Certified Dementia Practitioner [facility] [place] [facility] [phone]

## 2019-12-11 ENCOUNTER — Ambulatory Visit: Payer: Medicare Other

## 2019-12-11 DIAGNOSIS — I10 Essential (primary) hypertension: Secondary | ICD-10-CM

## 2019-12-11 DIAGNOSIS — J449 Chronic obstructive pulmonary disease, unspecified: Secondary | ICD-10-CM

## 2019-12-11 NOTE — Chronic Care Management (AMB) (Signed)
  Chronic Care Management    Social Work General Note  12/11/2019 Name: [name] MRN: [id number] DOB: 04-Nov-1955  [name] is a 64 y.o. year old female who is a primary care patient of Glendale Chard, MD. The CCM was consulted to assist the patient with care coordination.   Review of patient status, including review of consultants reports, relevant laboratory and other test results, and collaboration with appropriate care team members and the patient's provider was performed as part of comprehensive patient evaluation and provision of chronic care management services.    SDOH (Social Determinants of Health) assessments and interventions performed:  Yes, no acute challenges noted at this time.   Outpatient Encounter Medications as of 12/11/2019  Medication Sig Note  . albuterol (PROVENTIL) (2.5 MG/3ML) 0.083% nebulizer solution Take 3 mLs (2.5 mg total) by nebulization every 6 (six) hours.   Marland Kitchen albuterol (VENTOLIN HFA) 108 (90 Base) MCG/ACT inhaler Inhale 2 puffs into the lungs every 6 (six) hours as needed for wheezing or shortness of breath.   . ALPRAZolam (XANAX) 0.25 MG tablet TAKE 1 TABLET TWICE A DAY AS NEEDED   . aspirin EC 81 MG tablet Take 81 mg by mouth daily.   . Cholecalciferol (VITAMIN D-3) 1000 units CAPS Take 1,000 Units by mouth daily.   . citalopram (CELEXA) 40 MG tablet Take 1 tablet (40 mg total) by mouth at bedtime.   . clopidogrel (PLAVIX) 75 MG tablet TAKE 1 TABLET DAILY   . CRANBERRY PO Take 2 tablets by mouth 2 (two) times daily. 09/18/2018: 09/18/18 take one tab two x daily  . cyclobenzaprine (FLEXERIL) 10 MG tablet TAKE 1 TABLET AT BEDTIME AS NEEDED   . diclofenac sodium (VOLTAREN) 1 % GEL Apply 2 g topically 3 (three) times daily as needed (for pain).    . fluticasone (FLONASE) 50 MCG/ACT nasal spray Place 2 sprays into both nostrils daily as needed (allergies or post-nasl drip).  11/13/2017: New order; not yet started  . levETIRAcetam (KEPPRA) 500 MG  tablet Take 1 tablet (500 mg total) by mouth every 12 (twelve) hours.   Marland Kitchen levothyroxine (SYNTHROID) 50 MCG tablet TAKE 1 TABLET ON MONDAY THROUGH WEDNESDAY   . levothyroxine (SYNTHROID) 75 MCG tablet Take 75 mcg by mouth in the morning before breakfast on Thursday - Sunday , Take 50 mcg on other days   . losartan (COZAAR) 100 MG tablet Take 100 mg by mouth every morning.    . Multiple Vitamins-Minerals (ICAPS AREDS 2 PO) Take 1 capsule by mouth daily.   . Omega-3 Fatty Acids (FISH OIL) 1000 MG CAPS Take 2,000 mg by mouth 2 (two) times daily.    . rosuvastatin (CRESTOR) 10 MG tablet Take 1 tablet (10 mg total) by mouth daily.   Marland Kitchen topiramate (TOPAMAX) 25 MG tablet Take 1 tablet (25 mg total) by mouth 2 (two) times daily.   . vitamin C (ASCORBIC ACID) 500 MG tablet Take 500 mg by mouth daily.   . vitamin E 1000 UNIT capsule Take 1,000 Units by mouth at bedtime.    No facility-administered encounter medications on file as of 12/11/2019.    Follow Up Plan: No SW needs identified. The patient is encouraged to contact SW with future resource needs. The patient will remain active with RN Care Manager to assist with chronic care management.       Daneen Schick, BSW, CDP Social Worker, Certified Dementia Practitioner [place] [facility] [place] [facility] [phone]

## 2019-12-16 ENCOUNTER — Other Ambulatory Visit: Payer: Self-pay

## 2019-12-16 MED ORDER — TELMISARTAN 20 MG PO TABS: 20.0000 mg | ORAL_TABLET | Freq: Every day | ORAL | 1 refills | Status: DC

## 2019-12-16 MED ORDER — CITALOPRAM HYDROBROMIDE 40 MG PO TABS: 40.0000 mg | ORAL_TABLET | Freq: Every day | ORAL | 1 refills | Status: DC

## 2019-12-24 DIAGNOSIS — H353122 Nonexudative age-related macular degeneration, left eye, intermediate dry stage: Secondary | ICD-10-CM | POA: Diagnosis not present

## 2019-12-24 DIAGNOSIS — H353211 Exudative age-related macular degeneration, right eye, with active choroidal neovascularization: Secondary | ICD-10-CM | POA: Diagnosis not present

## 2019-12-30 ENCOUNTER — Other Ambulatory Visit: Payer: Self-pay | Admitting: Internal Medicine

## 2019-12-30 DIAGNOSIS — E039 Hypothyroidism, unspecified: Secondary | ICD-10-CM

## 2020-01-01 ENCOUNTER — Telehealth: Payer: Medicare Other

## 2020-01-01 ENCOUNTER — Other Ambulatory Visit: Payer: Self-pay

## 2020-01-01 ENCOUNTER — Ambulatory Visit (INDEPENDENT_AMBULATORY_CARE_PROVIDER_SITE_OTHER): Payer: Medicare Other

## 2020-01-01 DIAGNOSIS — E039 Hypothyroidism, unspecified: Secondary | ICD-10-CM

## 2020-01-01 DIAGNOSIS — F331 Major depressive disorder, recurrent, moderate: Secondary | ICD-10-CM | POA: Diagnosis not present

## 2020-01-01 DIAGNOSIS — I10 Essential (primary) hypertension: Secondary | ICD-10-CM

## 2020-01-01 DIAGNOSIS — J449 Chronic obstructive pulmonary disease, unspecified: Secondary | ICD-10-CM | POA: Diagnosis not present

## 2020-01-01 DIAGNOSIS — H3530 Unspecified macular degeneration: Secondary | ICD-10-CM

## 2020-01-03 NOTE — Chronic Care Management (AMB) (Signed)
Chronic Care Management   Follow Up Note   01/02/2020 Name: [name] MRN: [id number] DOB: 12/08/1955  Referred by: Glendale Chard, MD Reason for referral : Chronic Care Management (FU RN Call )   [name] is a 64 y.o. year old female who is a primary care patient of Glendale Chard, MD. The CCM team was consulted for assistance with chronic disease management and care coordination needs.    Review of patient status, including review of consultants reports, relevant laboratory and other test results, and collaboration with appropriate care team members and the patient's provider was performed as part of comprehensive patient evaluation and provision of chronic care management services.    SDOH (Social Determinants of Health) assessments performed: Yes - no acute challenges identified at this time.  See Care Plan activities for detailed interventions related to [place])   Placed outbound call to patient for a CCM RN CM follow up.     Outpatient Encounter Medications as of 01/01/2020  Medication Sig Note  . albuterol (PROVENTIL) (2.5 MG/3ML) 0.083% nebulizer solution Take 3 mLs (2.5 mg total) by nebulization every 6 (six) hours.   Marland Kitchen albuterol (VENTOLIN HFA) 108 (90 Base) MCG/ACT inhaler Inhale 2 puffs into the lungs every 6 (six) hours as needed for wheezing or shortness of breath.   . ALPRAZolam (XANAX) 0.25 MG tablet TAKE 1 TABLET TWICE A DAY AS NEEDED   . aspirin EC 81 MG tablet Take 81 mg by mouth daily.   . Cholecalciferol (VITAMIN D-3) 1000 units CAPS Take 1,000 Units by mouth daily.   . citalopram (CELEXA) 40 MG tablet Take 1 tablet (40 mg total) by mouth at bedtime.   . clopidogrel (PLAVIX) 75 MG tablet TAKE 1 TABLET DAILY   . CRANBERRY PO Take 2 tablets by mouth 2 (two) times daily. 09/18/2018: 09/18/18 take one tab two x daily  . cyclobenzaprine (FLEXERIL) 10 MG tablet TAKE 1 TABLET AT BEDTIME AS NEEDED   . diclofenac sodium (VOLTAREN) 1 % GEL Apply 2 g  topically 3 (three) times daily as needed (for pain).    . fluticasone (FLONASE) 50 MCG/ACT nasal spray Place 2 sprays into both nostrils daily as needed (allergies or post-nasl drip).  11/13/2017: New order; not yet started  . levETIRAcetam (KEPPRA) 500 MG tablet Take 1 tablet (500 mg total) by mouth every 12 (twelve) hours.   Marland Kitchen levothyroxine (SYNTHROID) 50 MCG tablet TAKE 1 TABLET ON MONDAY THROUGH WEDNESDAY   . levothyroxine (SYNTHROID) 75 MCG tablet Take 75 mcg by mouth in the morning before breakfast on Thursday - Sunday , Take 50 mcg on other days   . losartan (COZAAR) 100 MG tablet Take 100 mg by mouth every morning.    . Multiple Vitamins-Minerals (ICAPS AREDS 2 PO) Take 1 capsule by mouth daily.   . Omega-3 Fatty Acids (FISH OIL) 1000 MG CAPS Take 2,000 mg by mouth 2 (two) times daily.    . rosuvastatin (CRESTOR) 10 MG tablet Take 1 tablet (10 mg total) by mouth daily.   Marland Kitchen telmisartan (MICARDIS) 20 MG tablet Take 1 tablet (20 mg total) by mouth daily.   Marland Kitchen topiramate (TOPAMAX) 25 MG tablet Take 1 tablet (25 mg total) by mouth 2 (two) times daily.   . vitamin C (ASCORBIC ACID) 500 MG tablet Take 500 mg by mouth daily.   . vitamin E 1000 UNIT capsule Take 1,000 Units by mouth at bedtime.    No facility-administered encounter medications on file as of 01/01/2020.  Objective:  Lab Results  Component Value Date   HGBA1C 5.9 (H) 06/11/2019   HGBA1C 5.8 (H) 01/02/2019   HGBA1C 5.8 (H) 09/03/2018   Lab Results  Component Value Date   MICROALBUR 30 09/11/2019   [place]  11/23/2009    91        Total Cholesterol/HDL:CHD Risk Coronary Heart Disease Risk Table                     Men   Women  1/2 Average Risk   3.4   3.3  Average Risk       5.0   4.4  2 X Average Risk   9.6   7.1  3 X Average Risk  23.4   11.0        Use the calculated Patient Ratio above and the CHD Risk Table to determine the patient's CHD Risk.        ATP III CLASSIFICATION (LDL):  <100     mg/dL   Optimal   100-129  mg/dL   Near or Above                    Optimal  130-159  mg/dL   Borderline  160-189  mg/dL   High  >190     mg/dL   Very High   CREATININE 0.69 06/11/2019   BP Readings from Last 3 Encounters:  10/16/19 102/62  09/23/19 128/76  09/11/19 122/76    Goals Addressed      Patient Stated   . "to improve my mood" (pt-stated)       [place] (see longtitudinal plan of care for additional care plan information)  Current Barriers:  Marland Kitchen Knowledge Deficits related to disease process and Self Health management of depression and anxiety . Chronic Disease Management support and education needs related to Essential HTN, Moderate episode of recurrent major depressive disorder, COPD, Macular Degeneration of right eye  Nurse Case Manager Clinical Goal(s):  Marland Kitchen Over the next 90 days, patient will work with the [place] CM and PCP to address needs related to disease education and support for improved Self Health management of depression and anxiety  CCM RN CM Interventions:  01/02/20 call completed with patient  . Evaluation of current treatment plan related to depression and anxiety and patient's adherence to plan as established by provider. . Provided education to patient re: basic disease process, evaluation and treatment of depression and or anxiety . Reviewed medications with patient and discussed indication, dosage and frequency of prescribed antidepressant; patient feels the increase in her Celexa to 40 mg is effective and improving her mood; she was unable to titrate off Topamax but is able to tolerate a lower dose at this time  . Discussed plans with patient for ongoing care management follow up and provided patient with direct contact information for care management team . Confirmed patient received educational materials related to Mood and Your Health  Patient Self Care Activities:  . Self administers medications as prescribed . Attends all scheduled provider  appointments . Calls pharmacy for medication refills . Performs ADL's independently . Performs IADL's independently . Calls provider office for new concerns or questions  Please see past updates related to this goal by clicking on the "Past Updates" button in the selected goal      . "to keep my BP under good control" (pt-stated)       [place] (see longtitudinal plan of care for  additional care plan information)  Current Barriers:  Marland Kitchen Knowledge Deficits related to disease education and Self Health management of HTN . Chronic Disease Management support and education needs related to Essential HTN, Moderate episode of recurrent major depressive disorder, COPD, Macular Degeneration of right eye  Nurse Case Manager Clinical Goal(s):  Marland Kitchen Over the next 90 days, patient will work with CCM RN CM and PCP to address needs related to disease education and support improved Self Health management of HTN   CCM RN CM Interventions:  01/02/20 completed with patient  . Evaluation of current treatment plan related to Essential HTN and patient's adherence to plan as established by provider. . Provided education to patient re: High blood pressure is a systolic pressure of [id number] or higher,or a diastolic pressure of 80 or higher, that stays high over time; Educated on target BP <130/80 and best ways to achieve and maintain this BP via MD adherence to diet, exercise, maintaining idea body weight, no smoking and low stress life style and medication adherence . Reviewed medications with patient and discussed indication, dosage and frequency of prescribed antihypertensives; patient denies having noted SE . Discussed plans with patient for ongoing care management follow up and provided patient with direct contact information for care management team . Advised patient, providing education and rationale, to monitor blood pressure daily and record, calling the CCM team and or PCP for findings outside established  parameters.  . Confirmed patient received printed patient educational materials related HBP, she denies questions at this time.   Patient Self Care Activities:  . Self administers medications as prescribed . Attends all scheduled provider appointments . Calls pharmacy for medication refills . Performs ADL's independently . Performs IADL's independently . Calls provider office for new concerns or questions  Please see past updates related to this goal by clicking on the "Past Updates" button in the selected goal         Plan:   Telephone follow up appointment with care management team member scheduled for: 03/18/20  Barb Merino, RN, BSN, CCM Care Management Coordinator [place] [facility]  Direct Phone: [phone]

## 2020-01-03 NOTE — Patient Instructions (Signed)
Visit Information  Goals Addressed      Patient Stated   . "to improve my mood" (pt-stated)       CARE PLAN ENTRY (see longtitudinal plan of care for additional care plan information)  Current Barriers:  Marland Kitchen Knowledge Deficits related to disease process and Self Health management of depression and anxiety . Chronic Disease Management support and education needs related to Essential HTN, Moderate episode of recurrent major depressive disorder, COPD, Macular Degeneration of right eye  Nurse Case Manager Clinical Goal(s):  Marland Kitchen Over the next 90 days, patient will work with the [place] CM and PCP to address needs related to disease education and support for improved Self Health management of depression and anxiety  CCM RN CM Interventions:  01/02/20 call completed with patient  . Evaluation of current treatment plan related to depression and anxiety and patient's adherence to plan as established by provider. . Provided education to patient re: basic disease process, evaluation and treatment of depression and or anxiety . Reviewed medications with patient and discussed indication, dosage and frequency of prescribed antidepressant; patient feels the increase in her Celexa to 40 mg is effective and improving her mood; she was unable to titrate off Topamax but is able to tolerate a lower dose at this time  . Discussed plans with patient for ongoing care management follow up and provided patient with direct contact information for care management team . Confirmed patient received educational materials related to Mood and Your Health  Patient Self Care Activities:  . Self administers medications as prescribed . Attends all scheduled provider appointments . Calls pharmacy for medication refills . Performs ADL's independently . Performs IADL's independently . Calls provider office for new concerns or questions  Please see past updates related to this goal by clicking on the "Past Updates" button in the  selected goal      . "to keep my BP under good control" (pt-stated)       [place] (see longtitudinal plan of care for additional care plan information)  Current Barriers:  Marland Kitchen Knowledge Deficits related to disease education and Self Health management of HTN . Chronic Disease Management support and education needs related to Essential HTN, Moderate episode of recurrent major depressive disorder, COPD, Macular Degeneration of right eye  Nurse Case Manager Clinical Goal(s):  Marland Kitchen Over the next 90 days, patient will work with CCM RN CM and PCP to address needs related to disease education and support improved Self Health management of HTN   CCM RN CM Interventions:  01/02/20 completed with patient  . Evaluation of current treatment plan related to Essential HTN and patient's adherence to plan as established by provider. . Provided education to patient re: High blood pressure is a systolic pressure of [id number] or higher,or a diastolic pressure of 80 or higher, that stays high over time; Educated on target BP <130/80 and best ways to achieve and maintain this BP via MD adherence to diet, exercise, maintaining idea body weight, no smoking and low stress life style and medication adherence . Reviewed medications with patient and discussed indication, dosage and frequency of prescribed antihypertensives; patient denies having noted SE . Discussed plans with patient for ongoing care management follow up and provided patient with direct contact information for care management team . Advised patient, providing education and rationale, to monitor blood pressure daily and record, calling the CCM team and or PCP for findings outside established parameters.  . Confirmed patient received printed patient educational materials  related HBP, she denies questions at this time.   Patient Self Care Activities:  . Self administers medications as prescribed . Attends all scheduled provider appointments . Calls  pharmacy for medication refills . Performs ADL's independently . Performs IADL's independently . Calls provider office for new concerns or questions  Please see past updates related to this goal by clicking on the "Past Updates" button in the selected goal        Patient verbalizes understanding of instructions provided today.   Telephone follow up appointment with care management team member scheduled for: 03/18/20  Barb Merino, RN, BSN, CCM Care Management Coordinator [place] [facility]  Direct Phone: [phone]

## 2020-01-26 ENCOUNTER — Other Ambulatory Visit: Payer: Self-pay | Admitting: Internal Medicine

## 2020-01-26 NOTE — Telephone Encounter (Signed)
Flexeril: refill ° °

## 2020-03-11 ENCOUNTER — Encounter: Payer: Medicare Other | Admitting: Internal Medicine

## 2020-03-11 ENCOUNTER — Encounter: Payer: Self-pay | Admitting: Internal Medicine

## 2020-03-11 ENCOUNTER — Ambulatory Visit (INDEPENDENT_AMBULATORY_CARE_PROVIDER_SITE_OTHER): Payer: Medicare Other | Admitting: Internal Medicine

## 2020-03-11 ENCOUNTER — Other Ambulatory Visit: Payer: Self-pay

## 2020-03-11 VITALS — BP 114/78 | HR 97 | Temp 97.9°F | Ht 62.0 in | Wt 157.0 lb

## 2020-03-11 DIAGNOSIS — Z6828 Body mass index (BMI) 28.0-28.9, adult: Secondary | ICD-10-CM

## 2020-03-11 DIAGNOSIS — E663 Overweight: Secondary | ICD-10-CM | POA: Diagnosis not present

## 2020-03-11 DIAGNOSIS — K5909 Other constipation: Secondary | ICD-10-CM

## 2020-03-11 DIAGNOSIS — R7309 Other abnormal glucose: Secondary | ICD-10-CM

## 2020-03-11 DIAGNOSIS — Z122 Encounter for screening for malignant neoplasm of respiratory organs: Secondary | ICD-10-CM | POA: Diagnosis not present

## 2020-03-11 DIAGNOSIS — E039 Hypothyroidism, unspecified: Secondary | ICD-10-CM

## 2020-03-11 DIAGNOSIS — R0600 Dyspnea, unspecified: Secondary | ICD-10-CM | POA: Diagnosis not present

## 2020-03-11 DIAGNOSIS — F419 Anxiety disorder, unspecified: Secondary | ICD-10-CM

## 2020-03-11 DIAGNOSIS — I10 Essential (primary) hypertension: Secondary | ICD-10-CM

## 2020-03-11 DIAGNOSIS — R0609 Other forms of dyspnea: Secondary | ICD-10-CM

## 2020-03-11 MED ORDER — CITALOPRAM HYDROBROMIDE 40 MG PO TABS: 40.0000 mg | ORAL_TABLET | Freq: Every day | ORAL | 1 refills | Status: DC

## 2020-03-11 NOTE — Patient Instructions (Signed)
Call Dr. Baird Cancer to let her know which meds you need a refill on   Shortness of Breath, Adult Shortness of breath means you have trouble breathing. Shortness of breath could be a sign of a medical problem. Follow these instructions at home:   Watch for any changes in your symptoms.  Do not use any products that contain nicotine or tobacco, such as cigarettes, e-cigarettes, and chewing tobacco.  Do not smoke. Smoking can cause shortness of breath. If you need help to quit smoking, ask your doctor.  Avoid things that can make it harder to breathe, such as: ? Mold. ? Dust. ? Air pollution. ? Chemical smells. ? Things that can cause allergy symptoms (allergens), if you have allergies.  Keep your living space clean. Use products that help remove mold and dust.  Rest as needed. Slowly return to your normal activities.  Take over-the-counter and prescription medicines only as told by your doctor. This includes oxygen therapy and inhaled medicines.  Keep all follow-up visits as told by your doctor. This is important. Contact a doctor if:  Your condition does not get better as soon as expected.  You have a hard time doing your normal activities, even after you rest.  You have new symptoms. Get help right away if:  Your shortness of breath gets worse.  You have trouble breathing when you are resting.  You feel light-headed or you pass out (faint).  You have a cough that is not helped by medicines.  You cough up blood.  You have pain with breathing.  You have pain in your chest, arms, shoulders, or belly (abdomen).  You have a fever.  You cannot walk up stairs.  You cannot exercise the way you normally do. These symptoms may represent a serious problem that is an emergency. Do not wait to see if the symptoms will go away. Get medical help right away. Call your local emergency services (911 in the [place] Do not drive yourself to the hospital. Summary  Shortness of  breath is when you have trouble breathing enough air. It can be a sign of a medical problem.  Avoid things that make it hard for you to breathe, such as smoking, pollution, mold, and dust.  Watch for any changes in your symptoms. Contact your doctor if you do not get better or you get worse. This information is not intended to replace advice given to you by your health care provider. Make sure you discuss any questions you have with your health care provider. Document Revised: 01/22/2018 Document Reviewed: 01/22/2018 Elsevier Patient Education  [place].

## 2020-03-11 NOTE — Progress Notes (Signed)
This visit occurred during the SARS-CoV-2 public health emergency.  Safety protocols were in place, including screening questions prior to the visit, additional usage of staff PPE, and extensive cleaning of exam room while observing appropriate contact time as indicated for disinfecting solutions.  Subjective:     Patient ID: [name] , female    DOB: July 25, 1956 , 64 y.o.   MRN: [id number]   Chief Complaint  Patient presents with  . Hypertension  . Hypothyroidism  . Anxiety    HPI  HPI   Past Medical History:  Diagnosis Date  . Abnormal gait   . Amnesia   . Anemia    hx  . Aneurysm ([place])   . Anxiety   . Asthma   . Atrophic vaginitis   . Benign hypertensive heart disease   . Cardiomegaly   . Chest pain   . COPD (chronic obstructive pulmonary disease) ([place])     - PFTs  01/24/06 FEV1 69% ratio 68% diffusing capacity 57% with no improvement after B2   - PFT's 10/1/ 09 FEV1 77   ratio  76    dlco                   45            no resp to B2    - Nl alpha one antitripsin level 10/09  . Cough   . Depression   . Difficulty speaking   . Disorder of nasal cavity   . Dyspnea   . Enthesopathy of elbow region   . GERD (gastroesophageal reflux disease)   . Hammer toe   . Headache(784.0)   . Hemiparesis affecting dominant side as late effect of cerebrovascular accident ([place])   . History of kidney stones   . Hyperlipidemia   . Hypertension   . Hypothyroidism   . Increased urinary frequency   . Low compliance bladder   . Macular degeneration of both eyes   . Malaise and fatigue   . Nonruptured cerebral aneurysm   . Obesity   . Pneumonia    hx  . Pure hypercholesterolemia   . Recurrent major depressive episodes ([place])   . Seizure disorder, secondary ([place])    2-3 focal siezures monthly  . Seizures ([place])   . Short-term memory loss    since stroke  . Skin sensation disturbance   . Stroke [facility]) 2011   rt sided weakness  . Swelling of limb   . TMJ  (temporomandibular joint disorder)   . Urinary tract infectious disease   . Vertigo as late effect of stroke   . Vitamin D deficiency      Family History  Problem Relation Age of Onset  . Heart disease Mother   . Heart disease Father   . Heart attack Father   . Cancer Father        prostate to lymph node  . Lung cancer Other   . Breast cancer Neg Hx      Current Outpatient Medications:  .  albuterol (PROVENTIL) (2.5 MG/3ML) 0.083% nebulizer solution, Take 3 mLs (2.5 mg total) by nebulization every 6 (six) hours., Disp: 1080 mL, Rfl: 1 .  albuterol (VENTOLIN HFA) 108 (90 Base) MCG/ACT inhaler, Inhale 2 puffs into the lungs every 6 (six) hours as needed for wheezing or shortness of breath., Disp: 24 g, Rfl: 1 .  ALPRAZolam (XANAX) 0.25 MG tablet, TAKE 1 TABLET TWICE A DAY AS NEEDED, Disp: 90 tablet, Rfl: 1 .  aspirin EC 81 MG tablet, Take 81 mg by mouth daily., Disp: , Rfl:  .  Cholecalciferol (VITAMIN D-3) 1000 units CAPS, Take 1,000 Units by mouth daily., Disp: , Rfl:  .  citalopram (CELEXA) 40 MG tablet, Take 1 tablet (40 mg total) by mouth at bedtime., Disp: 90 tablet, Rfl: 1 .  clopidogrel (PLAVIX) 75 MG tablet, TAKE 1 TABLET DAILY, Disp: 90 tablet, Rfl: 3 .  CRANBERRY PO, Take 2 tablets by mouth 2 (two) times daily., Disp: , Rfl:  .  cyclobenzaprine (FLEXERIL) 10 MG tablet, TAKE 1 TABLET AT BEDTIME AS NEEDED, Disp: 60 tablet, Rfl: 2 .  fluticasone (FLONASE) 50 MCG/ACT nasal spray, Place 2 sprays into both nostrils daily as needed (allergies or post-nasl drip). , Disp: , Rfl: 3 .  levETIRAcetam (KEPPRA) 500 MG tablet, Take 1 tablet (500 mg total) by mouth every 12 (twelve) hours., Disp: 180 tablet, Rfl: 3 .  levothyroxine (SYNTHROID) 75 MCG tablet, Take 75 mcg by mouth in the morning before breakfast on Thursday - Sunday , Take 50 mcg on other days, Disp: 51 tablet, Rfl: 1 .  losartan (COZAAR) 100 MG tablet, Take 100 mg by mouth every morning. , Disp: , Rfl:  .  NON FORMULARY,  preser vision 2 tabs daily, Disp: , Rfl:  .  Omega-3 Fatty Acids (FISH OIL) 1000 MG CAPS, Take 2,000 mg by mouth 2 (two) times daily. , Disp: , Rfl:  .  rosuvastatin (CRESTOR) 10 MG tablet, Take 1 tablet (10 mg total) by mouth daily., Disp: 90 tablet, Rfl: 2 .  telmisartan (MICARDIS) 20 MG tablet, Take 1 tablet (20 mg total) by mouth daily., Disp: 90 tablet, Rfl: 1 .  topiramate (TOPAMAX) 25 MG tablet, Take 1 tablet (25 mg total) by mouth 2 (two) times daily., Disp: 180 tablet, Rfl: 2 .  vitamin C (ASCORBIC ACID) 500 MG tablet, Take 500 mg by mouth daily., Disp: , Rfl:  .  vitamin E 1000 UNIT capsule, Take 1,000 Units by mouth at bedtime., Disp: , Rfl:  .  diclofenac sodium (VOLTAREN) 1 % GEL, Apply 2 g topically 3 (three) times daily as needed (for pain).  (Patient not taking: Reported on 03/11/2020), Disp: , Rfl:  .  levothyroxine (SYNTHROID) 50 MCG tablet, TAKE 1 TABLET ON MONDAY THROUGH WEDNESDAY, Disp: 39 tablet, Rfl: 3   Allergies  Allergen Reactions  . Phenytoin Anaphylaxis, Hives and Swelling     Review of Systems  Constitutional: Negative.   Respiratory: Positive for shortness of breath.        She c/o worsening SOB, usually occurs with exertion. Denies chest pain.   Cardiovascular: Negative.   Gastrointestinal: Positive for constipation.  Neurological: Negative.   Psychiatric/Behavioral: The patient is nervous/anxious.      Today's Vitals   03/11/20 1420  BP: 114/78  Pulse: 97  Temp: 97.9 F (36.6 C)  TempSrc: Oral  Weight: 157 lb (71.2 kg)  Height: '5\' 2"'$  (1.575 m)   Body mass index is 28.72 kg/m.   Objective:  Physical Exam Vitals and nursing note reviewed.  Constitutional:      Appearance: Normal appearance.  HENT:     Head: Normocephalic and atraumatic.  Cardiovascular:     Rate and Rhythm: Normal rate and regular rhythm.     Heart sounds: Normal heart sounds.  Pulmonary:     Effort: Pulmonary effort is normal.     Breath sounds: Normal breath sounds.   Musculoskeletal:     Comments: Ambulatory with cane.  Skin:    General: Skin is warm.  Neurological:     General: No focal deficit present.     Mental Status: She is alert.  Psychiatric:        Mood and Affect: Mood normal.        Behavior: Behavior normal.         Assessment And Plan:     1. Essential hypertension  Chronic, well controlled. She will continue with current meds. She is encouraged to avoid adding salt to her foods. I will check renal function today.   - CMP14+EGFR  2. Acquired hypothyroidism  I will check thyroid panel and adjust meds as needed.  - TSH - T4, Free - Lipid panel  3. Anxiety  Chronic, she will continue with current meds. She may benefit from magnesium supplementation.   4. Other abnormal glucose  HER A1C HAS BEEN ELEVATED IN THE PAST. I WILL CHECK AN A1C, BMET TODAY. SHE WAS ENCOURAGED TO AVOID SUGARY BEVERAGES AND PROCESSED FOODS INCLUDNG BREADS, RICE AND PASTA.  - Hemoglobin A1c  5. Dyspnea on exertion  Recurrent. I will check CBC. We discussed need for cardiac evaluation. She requests referral to Dr. Burt Knack.   - CBC no Diff  6. Constipation, chronic  Advised to try Miralax nightly. If sx persist, I will consider trying Linzess, '72mg'$  daily.   7. Overweight with body mass index (BMI) of 28 to 28.9 in adult  Her weight is stable. Encouraged to incorporate more exercise into her daily routine.   [name], MD    THE PATIENT IS ENCOURAGED TO PRACTICE SOCIAL DISTANCING DUE TO THE COVID-19 PANDEMIC.

## 2020-03-12 LAB — LIPID PANEL
Chol/HDL Ratio: 2.3 ratio (ref 0.0–4.4)
Cholesterol, Total: 144 mg/dL (ref 100–199)
HDL: 62 mg/dL (ref >39)
LDL Chol Calc (NIH): 63 mg/dL (ref 0–99)
Triglycerides: 104 mg/dL (ref 0–149)
VLDL Cholesterol Cal: 19 mg/dL (ref 5–40)

## 2020-03-12 LAB — CMP14+EGFR
ALT: 29 IU/L (ref 0–32)
AST: 24 IU/L (ref 0–40)
Albumin/Globulin Ratio: 2 (ref 1.2–2.2)
Albumin: 4.5 g/dL (ref 3.8–4.8)
Alkaline Phosphatase: 127 IU/L — ABNORMAL HIGH (ref 48–121)
BUN/Creatinine Ratio: 29 — ABNORMAL HIGH (ref 12–28)
BUN: 19 mg/dL (ref 8–27)
Bilirubin Total: 0.3 mg/dL (ref 0.0–1.2)
CO2: 23 mmol/L (ref 20–29)
Calcium: 9.3 mg/dL (ref 8.7–10.3)
Chloride: 105 mmol/L (ref 96–106)
Creatinine, Ser: 0.66 mg/dL (ref 0.57–1.00)
GFR calc Af Amer: 109 mL/min/{1.73_m2} (ref >59)
GFR calc non Af Amer: 94 mL/min/{1.73_m2} (ref >59)
Globulin, Total: 2.2 g/dL (ref 1.5–4.5)
Glucose: 91 mg/dL (ref 65–99)
Potassium: 4.1 mmol/L (ref 3.5–5.2)
Sodium: 142 mmol/L (ref 134–144)
Total Protein: 6.7 g/dL (ref 6.0–8.5)

## 2020-03-12 LAB — T4, FREE: Free T4: 1.15 ng/dL (ref 0.82–1.77)

## 2020-03-12 LAB — CBC
Hematocrit: 39.2 % (ref 34.0–46.6)
Hemoglobin: 12.2 g/dL (ref 11.1–15.9)
MCH: 28.8 pg (ref 26.6–33.0)
MCHC: 31.1 g/dL — ABNORMAL LOW (ref 31.5–35.7)
MCV: 93 fL (ref 79–97)
Platelets: 227 10*3/uL (ref 150–450)
RBC: 4.23 x10E6/uL (ref 3.77–5.28)
RDW: 14.2 % (ref 11.7–15.4)
WBC: 6.9 10*3/uL (ref 3.4–10.8)

## 2020-03-12 LAB — HEMOGLOBIN A1C
Est. average glucose Bld gHb Est-mCnc: 117 mg/dL
Hgb A1c MFr Bld: 5.7 % — ABNORMAL HIGH (ref 4.8–5.6)

## 2020-03-12 LAB — TSH: TSH: 1.03 u[IU]/mL (ref 0.450–4.500)

## 2020-03-18 ENCOUNTER — Telehealth: Payer: Self-pay

## 2020-03-26 ENCOUNTER — Encounter: Payer: Self-pay | Admitting: Adult Health

## 2020-03-26 ENCOUNTER — Ambulatory Visit (INDEPENDENT_AMBULATORY_CARE_PROVIDER_SITE_OTHER): Payer: Medicare Other | Admitting: Adult Health

## 2020-03-26 ENCOUNTER — Other Ambulatory Visit: Payer: Self-pay

## 2020-03-26 ENCOUNTER — Other Ambulatory Visit: Payer: Self-pay | Admitting: Internal Medicine

## 2020-03-26 VITALS — BP 123/78 | HR 98 | Ht 62.0 in | Wt 154.0 lb

## 2020-03-26 DIAGNOSIS — G8929 Other chronic pain: Secondary | ICD-10-CM

## 2020-03-26 DIAGNOSIS — R252 Cramp and spasm: Secondary | ICD-10-CM

## 2020-03-26 DIAGNOSIS — R519 Headache, unspecified: Secondary | ICD-10-CM | POA: Diagnosis not present

## 2020-03-26 DIAGNOSIS — M21371 Foot drop, right foot: Secondary | ICD-10-CM

## 2020-03-26 DIAGNOSIS — Z8673 Personal history of transient ischemic attack (TIA), and cerebral infarction without residual deficits: Secondary | ICD-10-CM | POA: Diagnosis not present

## 2020-03-26 DIAGNOSIS — G40909 Epilepsy, unspecified, not intractable, without status epilepticus: Secondary | ICD-10-CM | POA: Diagnosis not present

## 2020-03-26 DIAGNOSIS — I69398 Other sequelae of cerebral infarction: Secondary | ICD-10-CM | POA: Diagnosis not present

## 2020-03-26 MED ORDER — NONFORMULARY OR COMPOUNDED ITEM: 240.0000 mg | Freq: Every day | 3 refills | Status: DC | PRN

## 2020-03-26 MED ORDER — TOPIRAMATE 25 MG PO TABS: 25.0000 mg | ORAL_TABLET | Freq: Two times a day (BID) | ORAL | 3 refills | Status: DC

## 2020-03-26 NOTE — Progress Notes (Signed)
[facility]  PATIENT: [name] DOB: 11/12/1955   REASON FOR VISIT: Follow-up for nontraumatic intracerebral hemorrhage, abnormality of gait, right foot drop, complex partial seizure disorder, and vascular headaches   Chief complaint: Chief Complaint  Patient presents with  . Follow-up    Rm 5 here for a f/u on stroke. Pt says her toes are curling up    HISTORY OF PRESENT ILLNESS:   [name] is a 64 y.o. 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  Returns today, 03/26/2020, for 23-month follow-up visit accompanied by her husband. Residual stroke deficit right spastic hemiparesis with concerns of worsening spasticity of ankle and toes which has been interfering with overall gait.  Continues to use a cane for ambulation.  Denies new stroke/TIA symptoms.  No recurrent seizure activity and remains on Keppra without side effects.  Chronic headache stable with ongoing use of Topamax 25 mg twice daily tolerating well.  Remains on aspirin, Plavix and Crestor for secondary stroke prevention without side effects.  Blood pressure today 122/78.  No further concerns at this time.       REVIEW OF SYSTEMS: Full 14 system review of systems performed and notable only for those listed, all others are neg:  Constitutional: neg Cardiovascular: neg Ear/Nose/Throat: neg  Skin: neg Eyes: neg Respiratory: cough Gastroitestinal: neg  Hematology/Lymphatic: neg Endocrine: neg Musculoskeletal: Walking difficulty Allergy/Immunology: neg Neurological: Weakness, spasticity, headache, seizures Psychiatric: Depression and anxiety Sleep : neg   ALLERGIES: Allergies  Allergen Reactions  . Phenytoin Anaphylaxis,  Hives and Swelling    HOME MEDICATIONS: Outpatient Medications Prior to Visit  Medication Sig Dispense Refill  . albuterol (PROVENTIL) (2.5 MG/3ML) 0.083% nebulizer solution Take 3 mLs (2.5 mg total) by nebulization every 6 (six) hours. 1080 mL 1  . albuterol (VENTOLIN HFA) 108 (90 Base) MCG/ACT inhaler Inhale 2 puffs into the lungs every 6 (six) hours as needed for wheezing or shortness of breath. 24 g 1  . ALPRAZolam (XANAX) 0.25 MG tablet TAKE 1 TABLET TWICE A DAY AS NEEDED 90 tablet 1  . aspirin EC 81 MG tablet Take 81 mg by mouth daily.    . Cholecalciferol (VITAMIN D-3) 1000 units CAPS Take 1,000 Units by mouth daily.    . citalopram (CELEXA) 40 MG tablet Take 1 tablet (40 mg total) by mouth at bedtime. 90 tablet 1  . clopidogrel (PLAVIX) 75 MG tablet TAKE 1 TABLET DAILY 90 tablet 3  . CRANBERRY PO Take 2 tablets by mouth 2 (two) times daily.    . cyclobenzaprine (FLEXERIL) 10 MG tablet TAKE 1 TABLET AT BEDTIME AS NEEDED 60 tablet 2  . fluticasone (FLONASE) 50 MCG/ACT nasal spray Place 2 sprays into both nostrils daily as needed (allergies or post-nasl drip).   3  . levETIRAcetam (KEPPRA) 500 MG tablet Take 1 tablet (500 mg total) by mouth every 12 (twelve) hours. 180 tablet 3  . levothyroxine (SYNTHROID) 50 MCG tablet TAKE 1 TABLET ON MONDAY THROUGH WEDNESDAY 39 tablet 3  . levothyroxine (SYNTHROID) 75 MCG tablet Take 75 mcg by mouth in the morning before breakfast on Thursday - Sunday , Take 50 mcg on other days 51 tablet 1  . losartan (COZAAR) 100 MG tablet Take 100 mg by mouth every morning.     . NON FORMULARY preser vision  2 tabs daily    . Omega-3 Fatty Acids (FISH OIL) 1000 MG CAPS Take 2,000 mg by mouth 2 (two) times daily.     . rosuvastatin (CRESTOR) 10 MG tablet Take 1 tablet (10 mg total) by mouth daily. 90 tablet 2  . telmisartan (MICARDIS) 20 MG tablet Take 1 tablet (20 mg total) by mouth daily. 90 tablet 1  . topiramate (TOPAMAX) 25 MG tablet Take 1 tablet (25 mg total)  by mouth 2 (two) times daily. 180 tablet 2  . vitamin C (ASCORBIC ACID) 500 MG tablet Take 500 mg by mouth daily.    . vitamin E 1000 UNIT capsule Take 1,000 Units by mouth at bedtime.    . diclofenac sodium (VOLTAREN) 1 % GEL Apply 2 g topically 3 (three) times daily as needed (for pain).  (Patient not taking: Reported on 03/11/2020)     No facility-administered medications prior to visit.    PAST MEDICAL HISTORY: Past Medical History:  Diagnosis Date  . Abnormal gait   . Amnesia   . Anemia    hx  . Aneurysm ([place])   . Anxiety   . Asthma   . Atrophic vaginitis   . Benign hypertensive heart disease   . Cardiomegaly   . Chest pain   . COPD (chronic obstructive pulmonary disease) ([place])     - PFTs  01/24/06 FEV1 69% ratio 68% diffusing capacity 57% with no improvement after B2   - PFT's 10/1/ 09 FEV1 77   ratio  76    dlco                   45            no resp to B2    - Nl alpha one antitripsin level 10/09  . Cough   . Depression   . Difficulty speaking   . Disorder of nasal cavity   . Dyspnea   . Enthesopathy of elbow region   . GERD (gastroesophageal reflux disease)   . Hammer toe   . Headache(784.0)   . Hemiparesis affecting dominant side as late effect of cerebrovascular accident ([place])   . History of kidney stones   . Hyperlipidemia   . Hypertension   . Hypothyroidism   . Increased urinary frequency   . Low compliance bladder   . Macular degeneration of both eyes   . Malaise and fatigue   . Nonruptured cerebral aneurysm   . Obesity   . Pneumonia    hx  . Pure hypercholesterolemia   . Recurrent major depressive episodes ([place])   . Seizure disorder, secondary ([place])    2-3 focal siezures monthly  . Seizures ([place])   . Short-term memory loss    since stroke  . Skin sensation disturbance   . Stroke [facility]) 2011   rt sided weakness  . Swelling of limb   . TMJ (temporomandibular joint disorder)   . Urinary tract infectious disease   . Vertigo as late effect of stroke    . Vitamin D deficiency     PAST SURGICAL HISTORY: Past Surgical History:  Procedure Laterality Date  . BACK SURGERY    . CEREBRAL ANGIOGRAM     x3  . COLONOSCOPY WITH PROPOFOL N/A 08/18/2016   Procedure: COLONOSCOPY WITH PROPOFOL;  Surgeon: Carol Ada, MD;  Location: [facility];  Service: Endoscopy;  Laterality: N/A;  . LAPAROSCOPIC HYSTERECTOMY    . MANDIBLE FRACTURE SURGERY    . RADIOLOGY WITH ANESTHESIA N/A  08/21/2013   Procedure: RADIOLOGY WITH ANESTHESIA;  Surgeon: Rob Hickman, MD;  Location: [facility];  Service: Radiology;  Laterality: N/A;  . stones     no surgery    FAMILY HISTORY: Family History  Problem Relation Age of Onset  . Heart disease Mother   . Heart disease Father   . Heart attack Father   . Cancer Father        prostate to lymph node  . Lung cancer Other   . Breast cancer Neg Hx     SOCIAL HISTORY: Social History   Socioeconomic History  . Marital status: Married    Spouse name: Jori Moll  . Number of children: 2  . Years of education: 47  . Highest education level: Not on file  Occupational History  . 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 Smoking status: Former Smoker    Packs/day: 2.00    Years: 30.00    Pack years: 60.00    Types: Cigarettes    Quit date: 11/27/2009    Years since quitting: 10.3  . Smokeless tobacco: Never Used  . Tobacco comment: smoked 2.5 ppd x 5 years, 1ppd x 20+ years  Vaping Use  . Vaping Use: Never used  Substance and Sexual Activity  . Alcohol use: No    Alcohol/week: 0.0 standard drinks    Comment: patient quit alcohol use 2005.   . Drug use: No  . Sexual activity: Not Currently  Other Topics Concern  . Not on file  Social History Narrative   Patient lives at home with her husband Jori Moll).   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    Patient is on disability.    Patient has an Associates degree.   Patient is  right-handed.   Patient drinks 2 cups of green tea most days.               Social Determinants of Health   Financial Resource Strain: Low Risk   . Difficulty of Paying Living Expenses: Not hard at all  Food Insecurity: No Food Insecurity  . Worried About Charity fundraiser in the Last Year: Never true  . Ran Out of Food in the Last Year: Never true  Transportation Needs: No Transportation Needs  . Lack of Transportation (Medical): No  . Lack of Transportation (Non-Medical): No  Physical Activity: Inactive  . Days of Exercise per Week: 0 days  . Minutes of Exercise per Session: 0 min  Stress: No Stress Concern Present  . Feeling of Stress : Only a little  Social Connections:   . Frequency of Communication with Friends and Family:   . Frequency of Social Gatherings with Friends and Family:   . Attends Religious Services:   . Active Member of Clubs or Organizations:   . Attends Archivist Meetings:   Marland Kitchen Marital Status:   Intimate Partner Violence:   . Fear of Current or Ex-Partner:   . Emotionally Abused:   Marland Kitchen Physically Abused:   . Sexually Abused:      PHYSICAL EXAM  Vitals:   03/26/20 1017  BP: 123/78  Pulse: 98  Weight: 154 lb (69.9 kg)  Height: 5\' 2"  (1.575 m)   Body mass index is 28.17 kg/m.  Generalized: Well developed, pleasant middle-age Caucasian female, in no acute distress  Head: normocephalic and atraumatic,. Oropharynx benign  Neck: Supple, no carotid  bruits  Cardiac: Regular rate rhythm, no murmur  Skin no rash few bruises  Neurological examination   Mentation: Alert oriented to time, place, history taking. Follows all commands speech and language fluent  Cranial nerve II-XII: Pupils were equal round reactive to light extraocular movements were full, visual field were full on confrontational test. Facial sensation and strength were normal. hearing intact.  Uvula tongue midline. head turning and shoulder shrug were normal and  symmetric.Tongue protrusion into cheek strength was normal. Motor: Mild 4/5 right-sided hemiparesis with distal more than proximal weakness increased tone on the right upper and lower extremity especially at ankle and toes. Right foot drop.  Sensory: Touch and pinprick are normal on the left mildly decreased on the right, vibratory is decreased on the right lower extremity  Coordination: Impaired on the right normal on the left  Reflexes: Brachioradialis 2/2, biceps 2/2, triceps 2/2, patellar 2/2, Achilles 1/1, plantar responses were flexor bilaterally. Gait and Station: Rising up from seated position without assistance, wide based hemiplegic gait right foot drop using  single-point cane with mild imbalance, unable to tandem  DIAGNOSTIC DATA (LABS, IMAGING, TESTING) - I reviewed patient records, labs, notes, testing and imaging myself where available.  Lab Results  Component Value Date   WBC 6.9 03/11/2020   HGB 12.2 03/11/2020   HCT 39.2 03/11/2020   MCV 93 03/11/2020   PLT 227 03/11/2020      Component Value Date/Time   NA 142 03/11/2020 1442   K 4.1 03/11/2020 1442   CL 105 03/11/2020 1442   CO2 23 03/11/2020 1442   GLUCOSE 91 03/11/2020 1442   GLUCOSE 94 05/28/2015 1302   BUN 19 03/11/2020 1442   CREATININE 0.66 03/11/2020 1442   CALCIUM 9.3 03/11/2020 1442   PROT 6.7 03/11/2020 1442   ALBUMIN 4.5 03/11/2020 1442   AST 24 03/11/2020 1442   ALT 29 03/11/2020 1442   ALKPHOS 127 (H) 03/11/2020 1442   BILITOT 0.3 03/11/2020 1442   GFRNONAA 94 03/11/2020 1442   GFRAA 109 03/11/2020 1442         ASSESSMENT AND PLAN 64y.o. year old female has a past medical history of left frontoparietal parenchymal hemorrhage March 2011 from PRES and malignant hypertension with residual right spastic hemiparesis. Chronic headaches with history of migraines  Placement of pipeline stent across Left PICA aneurysm by Dr. Estanislado Pandy on 08/21/2013. History of seizure disorder stable on Keppra.     Left frontoparietal hemorrhage 10/2009 -Residual deficits right spastic hemiparesis -Concern today regarding increased spasticity right ankle and toes  -Trial compounding cream: Diclofenac, baclofen, cyclobenzaprine, gabapentin and bupivacaine  -Referral placed to PMR for evaluation for possible benefit of Botox injections -Continue aspirin, Plavix and Crestor for secondary stroke prevention -Ensure close PCP follow-up for aggressive stroke risk factor management including HTN and HLD  Seizure disorder -Stable without recurrence -Continue Keppra 500 mg twice daily for seizure prophylaxis -refill provided  Chronic headaches -Mild headaches occur once every 1 to 2 months -Currently on Topamax 25 mg twice daily -recommend trialing decreasing dosage to 25 mg nightly and to restart if she experiences worsening headaches    Follow-up in 6 months or call earlier if needed   I spent 30 minutes of face-to-face and non-face-to-face time with patient and husband.  This included previsit chart review, lab review, study review, order entry, electronic health record documentation, patient education regarding history of hemorrhage with residual deficits and worsening spasticity, history of seizures and ongoing use of AED, chronic headaches,  importance of managing stroke risk factors and answered all questions to patient and husband satisfaction   Frann Rider, AGNP-BC  [facility] [address] [place], [place] [address]  Phone [phone] Fax [phone] Note: This document was prepared with digital dictation and possible smart phrase technology. Any transcriptional errors that result from this process are unintentional.

## 2020-03-26 NOTE — Patient Instructions (Signed)
Your Plan:   Recommend trialing compound cream which will include diclofenac, baclofen, cyclobenzaprine, gabapentin and bupivacaine -you will be called by the specialty pharmacy to provide insurance information and and shipping address  Referral will be placed to physical medicine and rehab for possible benefit of Botox injections  Continue Keppra 500 mg twice daily for seizure prophylaxis  Trial decreasing Topamax dosage to 25 mg nightly but if you experience worsening headaches, return to 25 mg twice daily dosing    Follow-up in 6 months or call earlier if needed     Thank you for coming to see [place] at [facility]. I hope we have been able to provide you high quality care today.  You may receive a patient satisfaction survey over the next few weeks. We would appreciate your feedback and comments so that we may continue to improve ourselves and the health of our patients.

## 2020-03-27 ENCOUNTER — Encounter: Payer: Self-pay | Admitting: Physical Medicine & Rehabilitation

## 2020-03-27 NOTE — Telephone Encounter (Signed)
Alprazolam refill 

## 2020-03-30 NOTE — Progress Notes (Signed)
[facility] received new order for pt [phone], [phone].

## 2020-04-01 ENCOUNTER — Telehealth: Payer: Self-pay | Admitting: *Deleted

## 2020-04-01 NOTE — Telephone Encounter (Signed)
Compounded cream alternative formula : meloxicam 0.5%, gabapentin 6%, lidocaine 2%, prilocaine 2%. [phone].

## 2020-04-09 ENCOUNTER — Encounter: Payer: Self-pay | Admitting: Physical Medicine & Rehabilitation

## 2020-04-09 ENCOUNTER — Other Ambulatory Visit: Payer: Self-pay

## 2020-04-09 ENCOUNTER — Encounter: Payer: Medicare Other | Attending: Physical Medicine & Rehabilitation | Admitting: Physical Medicine & Rehabilitation

## 2020-04-09 VITALS — BP 117/78 | HR 91 | Temp 97.5°F | Ht 62.0 in | Wt 155.8 lb

## 2020-04-09 DIAGNOSIS — G8110 Spastic hemiplegia affecting unspecified side: Secondary | ICD-10-CM | POA: Diagnosis not present

## 2020-04-09 DIAGNOSIS — I69351 Hemiplegia and hemiparesis following cerebral infarction affecting right dominant side: Secondary | ICD-10-CM | POA: Diagnosis not present

## 2020-04-09 NOTE — Progress Notes (Signed)
Subjective:    Patient ID: [name], female    DOB: October 19, 1955, 64 y.o.   MRN: [id number]  HPI CC:  RIght foot turning inward  64 yo female with SAH in 2011 which resulted from a probable aneurysmal bleed.  The patient had MI and stent placement left PICA in 2014.  She has had residual right-sided weakness, decreased coordination as well as increased tightness below the knee on the right side. Feels like she may twist her ankle but has not had any swelling in the ankle.  It mainly feels unstable especially without a brace on Has fallen but not for ~1yr No sprain or fracture of Right foot or ankle in the past. No problems in the left foot and ankle area She did have a history of wearing orthopedic shoes as a kid.  Stayed in SNF following CVA Had OP PT, OT at [facility] Has tried several braces Has not tried baclofen or tizanidine No pain but has tunng of ankle   Has reduced sensation RLE in terms of touch  Pain Inventory Average Pain 8 Pain Right Now 8 My pain is constant, burning, stabbing, tingling and aching  In the last 24 hours, has pain interfered with the following? General activity 7 Relation with others 0 Enjoyment of life 10 What TIME of day is your pain at its worst? night Sleep (in general) Sleep medicince helps  Pain is worse with: walking and sitting Pain improves with: rest Relief from Meds: unknown  Mobility use a cane use a walker how many minutes can you walk? 30 mins ability to climb steps?  yes do you drive?  yes Do you have any goals in this area?  yes  Function not employed: date last employed layed off in 2010 I need assistance with the following:  meal prep, household duties and shopping Do you have any goals in this area?  yes  Neuro/Psych bowel control problems weakness numbness tremor tingling trouble walking spasms dizziness confusion anxiety  Prior Studies Any changes since last visit?  yes New  Patient  Physicians involved in your care Any changes since last visit?  no New Patient   Family History  Problem Relation Age of Onset  . Heart disease Mother   . Heart disease Father   . Heart attack Father   . Cancer Father        prostate to lymph node  . Lung cancer Other   . Breast cancer Neg Hx    Social History   Socioeconomic History  . Marital status: Married    Spouse name: [name]  . Number of children: 2  . Years of education: 69  . Highest education level: Not on file  Occupational History  . 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 Smoking status: Former Smoker    Packs/day: 2.00    Years: 30.00    Pack years: 60.00    Types: Cigarettes    Quit date: 11/27/2009    Years since quitting: 10.3  . Smokeless tobacco: Never Used  . Tobacco comment: smoked 2.5 ppd x 5 years, 1ppd x 20+ years  Vaping Use  . Vaping Use: Never used  Substance and Sexual Activity  . Alcohol use: No    Alcohol/week: 0.0 standard drinks    Comment: patient quit alcohol use 2005.   . Drug use: No  . Sexual activity: Not Currently  Other Topics Concern  . Not on file  Social History Narrative   Patient lives at home with her husband [name]).   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    Patient is on disability.    Patient has an Associates degree.   Patient is right-handed.   Patient drinks 2 cups of green tea most days.               Social Determinants of Health   Financial Resource Strain: Low Risk   . Difficulty of Paying Living Expenses: Not hard at all  Food Insecurity: No Food Insecurity  . Worried About Charity fundraiser in the Last Year: Never true  . Ran Out of Food in the Last Year: Never true  Transportation Needs: No Transportation Needs  . Lack of Transportation (Medical): No  . Lack of Transportation (Non-Medical): No  Physical Activity: Inactive  . Days of Exercise  per Week: 0 days  . Minutes of Exercise per Session: 0 min  Stress: No Stress Concern Present  . Feeling of Stress : Only a little  Social Connections:   . Frequency of Communication with Friends and Family:   . Frequency of Social Gatherings with Friends and Family:   . Attends Religious Services:   . Active Member of Clubs or Organizations:   . Attends Archivist Meetings:   Marland Kitchen Marital Status:    Past Surgical History:  Procedure Laterality Date  . BACK SURGERY    . CEREBRAL ANGIOGRAM     x3  . COLONOSCOPY WITH PROPOFOL N/A 08/18/2016   Procedure: COLONOSCOPY WITH PROPOFOL;  Surgeon: Carol Ada, MD;  Location: [facility];  Service: Endoscopy;  Laterality: N/A;  . LAPAROSCOPIC HYSTERECTOMY    . MANDIBLE FRACTURE SURGERY    . RADIOLOGY WITH ANESTHESIA N/A 08/21/2013   Procedure: RADIOLOGY WITH ANESTHESIA;  Surgeon: Rob Hickman, MD;  Location: [place];  Service: Radiology;  Laterality: N/A;  . stones     no surgery   Past Medical History:  Diagnosis Date  . Abnormal gait   . Amnesia   . Anemia    hx  . Aneurysm ([place])   . Anxiety   . Asthma   . Atrophic vaginitis   . Benign hypertensive heart disease   . Cardiomegaly   . Chest pain   . COPD (chronic obstructive pulmonary disease) ([place])     - PFTs  01/24/06 FEV1 69% ratio 68% diffusing capacity 57% with no improvement after B2   - PFT's 10/1/ 09 FEV1 77   ratio  76    dlco                   45            no resp to B2    - Nl alpha one antitripsin level 10/09  . Cough   . Depression   . Difficulty speaking   . Disorder of nasal cavity   . Dyspnea   . Enthesopathy of elbow region   . GERD (gastroesophageal reflux disease)   . Hammer toe   . Headache(784.0)   . Hemiparesis affecting dominant side as late effect of cerebrovascular accident ([place])   . History of kidney stones   . Hyperlipidemia   . Hypertension   . Hypothyroidism   . Increased urinary frequency   . Low compliance bladder   . Macular  degeneration of both eyes   . Malaise and fatigue   .  Nonruptured cerebral aneurysm   . Obesity   . Pneumonia    hx  . Pure hypercholesterolemia   . Recurrent major depressive episodes ([place])   . Seizure disorder, secondary ([place])    2-3 focal siezures monthly  . Seizures ([place])   . Short-term memory loss    since stroke  . Skin sensation disturbance   . Stroke [facility]) 2011   rt sided weakness  . Swelling of limb   . TMJ (temporomandibular joint disorder)   . Urinary tract infectious disease   . Vertigo as late effect of stroke   . Vitamin D deficiency    Temp (!) 97.5 F (36.4 C)   Ht 5\' 2"  (1.575 m)   Wt 155 lb 12.8 oz (70.7 kg)   BMI 28.50 kg/m   Opioid Risk Score:   Fall Risk Score:  `1  Depression screen PHQ 2/9  Depression screen [facility] 2/9 03/11/2020 09/11/2019 06/11/2019 09/03/2018  Decreased Interest 0 0 0 0  Down, Depressed, Hopeless 0 0 0 0  PHQ - 2 Score 0 0 0 0  Altered sleeping 3 3 - -  Tired, decreased energy 2 3 - -  Change in appetite 0 0 - -  Feeling bad or failure about yourself  0 0 - -  Trouble concentrating 3 0 - -  Moving slowly or fidgety/restless 0 0 - -  Suicidal thoughts 0 0 - -  PHQ-9 Score 8 6 - -  Difficult doing work/chores Not difficult at all Not difficult at all - -   Review of Systems  Constitutional: Negative.   HENT: Negative.   Eyes:       Eye and face drop  Respiratory: Negative.   Cardiovascular: Negative.   Gastrointestinal: Positive for constipation.  Genitourinary: Negative for frequency and urgency.       Urine leakage  Musculoskeletal: Positive for back pain and gait problem.       Spasms  Skin: Negative.   Allergic/Immunologic: Negative.   Neurological: Positive for dizziness, tremors, weakness and numbness.  Hematological: Negative.   Psychiatric/Behavioral: Negative.        Anxiety       Objective:   Physical Exam Vitals and nursing note reviewed.  Constitutional:      Appearance: She is normal weight.  Eyes:      Extraocular Movements: Extraocular movements intact.     Conjunctiva/sclera: Conjunctivae normal.     Pupils: Pupils are equal, round, and reactive to light.  Musculoskeletal:     Comments: Equina varus deformity right foot as well as valgus deformity right great toe with constant right IP joint flexion No pain with range of motion in the right foot or ankle area  Neurological:     Mental Status: She is alert and oriented to person, place, and time.     Gait: Gait abnormal.     Comments: Motor strength is 4/5 in the right deltoid, bicep, tricep, grip, hip flexor, knee extensor, 3 - ankle plantar flexor to minus ankle dorsiflexor 0 at the foot inverters.  Toe flexion and extension is minimal  During ambulation the patient exhibits increased foot inversion.  Also there is increased toe flexion particularly of the great toe  Psychiatric:        Mood and Affect: Mood normal.        Behavior: Behavior normal.           Assessment & Plan:  #1.  Spasticity right lower extremity related to history of  left intracranial hemorrhage. Patient has tried conservative care such as physical therapy, given the focal nature of the spasticity would not command systemic medication such as baclofen or tizanidine. Discussed the pros and cons of botulinum toxin injection as well as phenol injection. We discussed the low likelihood of distant toxin spread Patient wishes to proceed with therapy. We will schedule for Botox to the following muscle groups Right posterior tibialis 75 units Right FDL 50 units Right FHL 75 units We also did the potential need for adjusting future doses and muscle group selection. Discussed that botulinum toxin lasts an average of 3 months and can be reinjected patient and her husband agree to treatment

## 2020-04-09 NOTE — Patient Instructions (Signed)
OnabotulinumtoxinA injection (Medical Use) What is this medicine? ONABOTULINUMTOXINA (o na BOTT you lye num tox in eh) is a neuro-muscular blocker. This medicine is used to treat crossed eyes, eyelid spasms, severe neck muscle spasms, ankle and toe muscle spasms, and elbow, wrist, and finger muscle spasms. It is also used to treat excessive underarm sweating, to prevent chronic migraine headaches, and to treat loss of bladder control due to neurologic conditions such as multiple sclerosis or spinal cord injury. This medicine may be used for other purposes; ask your health care provider or pharmacist if you have questions. COMMON BRAND NAME(S): Botox What should I tell my health care provider before I take this medicine? They need to know if you have any of these conditions:  breathing problems  cerebral palsy spasms  difficulty urinating  heart problems  history of surgery where this medicine is going to be used  infection at the site where this medicine is going to be used  myasthenia gravis or other neurologic disease  nerve or muscle disease  surgery plans  take medicines that treat or prevent blood clots  thyroid problems  an unusual or allergic reaction to botulinum toxin, albumin, other medicines, foods, dyes, or preservatives  pregnant or trying to get pregnant  breast-feeding How should I use this medicine? This medicine is for injection into a muscle. It is given by a health care professional in a hospital or clinic setting. Talk to your pediatrician regarding the use of this medicine in children. While this drug may be prescribed for children as young as 11 years old for selected conditions, precautions do apply. Overdosage: If you think you have taken too much of this medicine contact a poison control center or emergency room at once. NOTE: This medicine is only for you. Do not share this medicine with others. What if I miss a dose? This does not apply. What may  interact with this medicine?  aminoglycoside antibiotics like gentamicin, neomycin, tobramycin  muscle relaxants  other botulinum toxin injections This list may not describe all possible interactions. Give your health care provider a list of all the medicines, herbs, non-prescription drugs, or dietary supplements you use. Also tell them if you smoke, drink alcohol, or use illegal drugs. Some items may interact with your medicine. What should I watch for while using this medicine? Visit your doctor for regular check ups. This medicine will cause weakness in the muscle where it is injected. Tell your doctor if you feel unusually weak in other muscles. Get medical help right away if you have problems with breathing, swallowing, or talking. This medicine might make your eyelids droop or make you see blurry or double. If you have weak muscles or trouble seeing do not drive a car, use machinery, or do other dangerous activities. This medicine contains albumin from human blood. It may be possible to pass an infection in this medicine, but no cases have been reported. Talk to your doctor about the risks and benefits of this medicine. If your activities have been limited by your condition, go back to your regular routine slowly after treatment with this medicine. What side effects may I notice from receiving this medicine? Side effects that you should report to your doctor or health care professional as soon as possible:  allergic reactions like skin rash, itching or hives, swelling of the face, lips, or tongue  breathing problems  changes in vision  chest pain or tightness  eye irritation, pain  fast, irregular heartbeat  infection  numbness  speech problems  swallowing problems  unusual weakness Side effects that usually do not require medical attention (report to your doctor or health care professional if they continue or are bothersome):  bruising or pain at site where  injected  drooping eyelid  dry eyes or mouth  headache  muscles aches, pains  sensitivity to light  tearing This list may not describe all possible side effects. Call your doctor for medical advice about side effects. You may report side effects to FDA at 1-800-FDA-1088. Where should I keep my medicine? This drug is given in a hospital or clinic and will not be stored at home. NOTE: This sheet is a summary. It may not cover all possible information. If you have questions about this medicine, talk to your doctor, pharmacist, or health care provider.  2020 Elsevier/Gold Standard (2018-02-26 14:21:42)  

## 2020-04-14 DIAGNOSIS — H2513 Age-related nuclear cataract, bilateral: Secondary | ICD-10-CM | POA: Diagnosis not present

## 2020-04-14 DIAGNOSIS — H353122 Nonexudative age-related macular degeneration, left eye, intermediate dry stage: Secondary | ICD-10-CM | POA: Diagnosis not present

## 2020-04-14 DIAGNOSIS — H52223 Regular astigmatism, bilateral: Secondary | ICD-10-CM | POA: Diagnosis not present

## 2020-04-14 DIAGNOSIS — H524 Presbyopia: Secondary | ICD-10-CM | POA: Diagnosis not present

## 2020-04-14 DIAGNOSIS — H04123 Dry eye syndrome of bilateral lacrimal glands: Secondary | ICD-10-CM | POA: Diagnosis not present

## 2020-04-14 DIAGNOSIS — H353211 Exudative age-related macular degeneration, right eye, with active choroidal neovascularization: Secondary | ICD-10-CM | POA: Diagnosis not present

## 2020-04-20 ENCOUNTER — Ambulatory Visit (INDEPENDENT_AMBULATORY_CARE_PROVIDER_SITE_OTHER): Payer: Medicare Other | Admitting: Cardiology

## 2020-04-20 ENCOUNTER — Encounter: Payer: Self-pay | Admitting: Cardiology

## 2020-04-20 ENCOUNTER — Other Ambulatory Visit: Payer: Self-pay

## 2020-04-20 ENCOUNTER — Telehealth: Payer: Self-pay | Admitting: Cardiology

## 2020-04-20 VITALS — BP 118/72 | HR 87 | Temp 97.0°F | Ht 62.0 in | Wt 155.0 lb

## 2020-04-20 DIAGNOSIS — E785 Hyperlipidemia, unspecified: Secondary | ICD-10-CM | POA: Diagnosis not present

## 2020-04-20 DIAGNOSIS — I700 Atherosclerosis of aorta: Secondary | ICD-10-CM

## 2020-04-20 DIAGNOSIS — Z7189 Other specified counseling: Secondary | ICD-10-CM | POA: Diagnosis not present

## 2020-04-20 DIAGNOSIS — Z8673 Personal history of transient ischemic attack (TIA), and cerebral infarction without residual deficits: Secondary | ICD-10-CM | POA: Diagnosis not present

## 2020-04-20 DIAGNOSIS — R072 Precordial pain: Secondary | ICD-10-CM | POA: Diagnosis not present

## 2020-04-20 DIAGNOSIS — I10 Essential (primary) hypertension: Secondary | ICD-10-CM

## 2020-04-20 DIAGNOSIS — Z8249 Family history of ischemic heart disease and other diseases of the circulatory system: Secondary | ICD-10-CM

## 2020-04-20 NOTE — Patient Instructions (Addendum)
Medication Instructions:  Your Physician recommend you continue on your current medication as directed.    Please call us back and let us know if you are on both the losartan 100 mg dose and the telmisartan 20 mg dose  *If you need a refill on your cardiac medications before your next appointment, please call your pharmacy*   Lab Work: None   Testing/Procedures: Your physician has requested that you have a lexiscan myoview. For further information please visit [email or web address]. Please follow instruction sheet, as given. [place]. [address]    Follow-Up: At [facility], you and your health needs are our priority.  As part of our continuing mission to provide you with exceptional heart care, we have created designated Provider Care Teams.  These Care Teams include your primary Cardiologist (physician) and Advanced Practice Providers (APPs -  Physician Assistants and Nurse Practitioners) who all work together to provide you with the care you need, when you need it.  We recommend signing up for the patient portal called "MyChart".  Sign up information is provided on this After Visit Summary.  MyChart is used to connect with patients for Virtual Visits (Telemedicine).  Patients are able to view lab/test results, encounter notes, upcoming appointments, etc.  Non-urgent messages can be sent to your provider as well.   To learn more about what you can do with MyChart, go to [email or web address].    Your next appointment:   3 month(s)  The format for your next appointment:   In Person  Provider:   Buford Dresser, MD  You are scheduled for a Myocardial Perfusion Imaging Study on.  Please arrive 15 minutes prior to your appointment time for registration and insurance purposes.  The test will take approximately 3 to 4 hours to complete; you may bring reading material.  If someone comes with you to your appointment, they will need to remain in the main lobby due to  limited space in the testing area. **If you are pregnant or breastfeeding, please notify the nuclear lab prior to your appointment**  How to prepare for your Myocardial Perfusion Test: . Do not eat or drink 3 hours prior to your test, except you may have water. . Do not consume products containing caffeine (regular or decaffeinated) 12 hours prior to your test. (ex: coffee, chocolate, sodas, tea). . Do bring a list of your current medications with you.  If not listed below, you may take your medications as normal. . Do wear comfortable clothes (no dresses or overalls) and walking shoes, tennis shoes preferred (No heels or open toe shoes are allowed). . Do NOT wear cologne, perfume, aftershave, or lotions (deodorant is allowed). . If these instructions are not followed, your test will have to be rescheduled.  Please report to [place], [address] for your test.  If you have questions or concerns about your appointment, you can call the Nuclear Lab at [phone].  If you cannot keep your appointment, please provide 24 hours notification to the Nuclear Lab, to avoid a possible $50 charge to your account.

## 2020-04-20 NOTE — Telephone Encounter (Signed)
Patient states she would like to make Dr. Harrell Gave aware tha she is only taking   telmisartan (MICARDIS) 20 MG tablet    medication for BP.

## 2020-04-20 NOTE — Telephone Encounter (Signed)
Patient called to let Dr. Harrell Gave know she is only taking Micardis 20mg . Advised patient I would forward her message to Dr. Harrell Gave.

## 2020-04-20 NOTE — Progress Notes (Signed)
Cardiology Office Note:    Date:  04/20/2020   ID:  [name], DOB September 12, 1955, MRN [id number]  PCP:  Glendale Chard, MD  Cardiologist:  Buford Dresser, MD  Referring MD: Glendale Chard, MD   CC: new patient evaluation for dyspnea on exertion  History of Present Illness:    [name] is a 64 y.o. female with a hx of brain aneurysm, aortic atherosclerosis, hypertension, hyperlipidemia, tobacco use, COPD, prior ICH who is seen as a new consult at the request of Glendale Chard, MD for the evaluation and management of dyspnea on exertion.  Note from 03/11/20 with Dr. Baird Cancer reviewed. Noted dyspnea on exertion, no chest pain.   Patient concerns today: has occasional left arm pain and back/shoulder pain. Intermittent, not clearly related to exertion. Goes away on its own, better with laying down. Sometimes also feels like a heaviness in her left chest. Walks with a cane since her stroke, so doesn't exert herself heavily.  Cardiovascular risk factors: Prior clinical ASCVD: ICH/CVA Comorbid conditions: endorses hypertension, hyperlipidemia. Denies diabetes, chronic kidney disease Metabolic syndrome/Obesity: no, BMI 28 Chronic inflammatory conditions: denies Tobacco use history: former, from age 69 until the day of her stroke in 2011. None since. Was 2 ppd at her peak. Family history: father has stents, has had open heart surgery. Has cancer now. Mother has a heart murmur. No heart surgeries that she knows of. Has a brother, has sleep apnea. No known heart issues. Prior cardiac testing and/or incidental findings on other testing (ie coronary calcium): echo 2015. CT noncon lung 04/19/19 showed aortic atherosclerosis and coronary calcification Exercise level: minimal, can walk slowly with cane but that is her maximum.  Reviewed medications at length. Easy bruising. No melena/rare hemorrhoidal hematochezia/ no hematuria. Does get nosebleeds and occasional bleeding in her  ear (she thinks from scratching).  Denies shortness of breath at rest. No PND, orthopnea, LE edema or unexpected weight gain. No recent syncope or palpitations. She has some falls on occasion, usually when turning suddenly with vision changes. Does not think she fully lost consciousness but she is unsure.  We discussed options for testing. Noted that her dad had an MI while getting a stress test and had to be rushed to the hospital. On noncontrast CT, already has prominent calcium in distal left main/proximal LAD.  Has annual angiogram with Dr. Estanislado Pandy for her history of intracranial stent/ICH.  Past Medical History:  Diagnosis Date  . Abnormal gait   . Amnesia   . Anemia    hx  . Aneurysm ([place])   . Anxiety   . Asthma   . Atrophic vaginitis   . Benign hypertensive heart disease   . Cardiomegaly   . Chest pain   . COPD (chronic obstructive pulmonary disease) ([place])     - PFTs  01/24/06 FEV1 69% ratio 68% diffusing capacity 57% with no improvement after B2   - PFT's 10/1/ 09 FEV1 77   ratio  76    dlco                   45            no resp to B2    - Nl alpha one antitripsin level 10/09  . Cough   . Depression   . Difficulty speaking   . Disorder of nasal cavity   . Dyspnea   . Enthesopathy of elbow region   . GERD (gastroesophageal reflux disease)   . Hammer toe   .  Headache(784.0)   . Hemiparesis affecting dominant side as late effect of cerebrovascular accident ([place])   . History of kidney stones   . Hyperlipidemia   . Hypertension   . Hypothyroidism   . Increased urinary frequency   . Low compliance bladder   . Macular degeneration of both eyes   . Malaise and fatigue   . Nonruptured cerebral aneurysm   . Obesity   . Pneumonia    hx  . Pure hypercholesterolemia   . Recurrent major depressive episodes ([place])   . Seizure disorder, secondary ([place])    2-3 focal siezures monthly  . Seizures ([place])   . Short-term memory loss    since stroke  . Skin sensation disturbance    . Stroke [facility]) 2011   rt sided weakness  . Swelling of limb   . TMJ (temporomandibular joint disorder)   . Urinary tract infectious disease   . Vertigo as late effect of stroke   . Vitamin D deficiency     Past Surgical History:  Procedure Laterality Date  . BACK SURGERY    . CEREBRAL ANGIOGRAM     x3  . COLONOSCOPY WITH PROPOFOL N/A 08/18/2016   Procedure: COLONOSCOPY WITH PROPOFOL;  Surgeon: Carol Ada, MD;  Location: [facility] END[facility]COPY;  Service: Endoscopy;  Laterality: N/A;  . LAPAROSCOPIC HYSTERECTOMY    . MANDIBLE FRACTURE SURGERY    . RADIOLOGY WITH ANESTHESIA N/A 08/21/2013   Procedure: RADIOLOGY WITH ANESTHESIA;  Surgeon: Rob Hickman, MD;  Location: [place];  Service: Radiology;  Laterality: N/A;  . stones     no surgery    Current Medications: Current Outpatient Medications on File Prior to Visit  Medication Sig  . albuterol (PROVENTIL) (2.5 MG/3ML) 0.083% nebulizer solution Take 3 mLs (2.5 mg total) by nebulization every 6 (six) hours.  Marland Kitchen albuterol (VENTOLIN HFA) 108 (90 Base) MCG/ACT inhaler Inhale 2 puffs into the lungs every 6 (six) hours as needed for wheezing or shortness of breath.  . ALPRAZolam (XANAX) 0.25 MG tablet TAKE 1 TABLET TWICE A DAY AS NEEDED  . aspirin EC 81 MG tablet Take 81 mg by mouth daily.  . Biotin 1 MG CAPS Take by mouth.  . Cholecalciferol (VITAMIN D-3) 1000 units CAPS Take 1,000 Units by mouth daily.  . citalopram (CELEXA) 40 MG tablet Take 1 tablet (40 mg total) by mouth at bedtime.  . clopidogrel (PLAVIX) 75 MG tablet TAKE 1 TABLET DAILY  . CRANBERRY PO Take 2 tablets by mouth 2 (two) times daily.  . cyclobenzaprine (FLEXERIL) 10 MG tablet TAKE 1 TABLET AT BEDTIME AS NEEDED  . diclofenac Sodium (VOLTAREN) 1 % GEL Voltaren 1 % topical gel  APPLY 2 GRAM TO THE AFFECTED AREA(S) BY TOPICAL ROUTE 4 TIMES PER DAY  . fluticasone (FLONASE) 50 MCG/ACT nasal spray Place 2 sprays into both nostrils daily as needed (allergies or post-nasl drip).    Marland Kitchen levETIRAcetam (KEPPRA) 500 MG tablet Take 1 tablet (500 mg total) by mouth every 12 (twelve) hours.  Marland Kitchen levothyroxine (SYNTHROID) 50 MCG tablet TAKE 1 TABLET ON MONDAY THROUGH WEDNESDAY  . levothyroxine (SYNTHROID) 75 MCG tablet Take 75 mcg by mouth in the morning before breakfast on Thursday - Sunday , Take 50 mcg on other days  . losartan (COZAAR) 100 MG tablet Take 100 mg by mouth every morning.   . Multiple Vitamins-Minerals (PRESERVISION/LUTEIN PO) Take by mouth.  . NON FORMULARY preser vision 2 tabs daily  . NONFORMULARY OR COMPOUNDED ITEM Apply 240 mg  topically daily as needed.  . Omega-3 Fatty Acids (FISH OIL) 1000 MG CAPS Take 2,000 mg by mouth 2 (two) times daily.   . rosuvastatin (CRESTOR) 10 MG tablet Take 1 tablet (10 mg total) by mouth daily.  Marland Kitchen telmisartan (MICARDIS) 20 MG tablet Take 1 tablet (20 mg total) by mouth daily.  Marland Kitchen topiramate (TOPAMAX) 25 MG tablet Take 1 tablet (25 mg total) by mouth 2 (two) times daily.  . vitamin C (ASCORBIC ACID) 500 MG tablet Take 500 mg by mouth daily.  . vitamin E 1000 UNIT capsule Take 1,000 Units by mouth at bedtime.   No current facility-administered medications on file prior to visit.     Allergies:   Phenytoin   Social History   Tobacco Use  . Smoking status: Former Smoker    Packs/day: 2.00    Years: 30.00    Pack years: 60.00    Types: Cigarettes    Quit date: 11/27/2009    Years since quitting: 10.4  . Smokeless tobacco: Never Used  . Tobacco comment: smoked 2.5 ppd x 5 years, 1ppd x 20+ years  Vaping Use  . Vaping Use: Never used  Substance Use Topics  . Alcohol use: No    Alcohol/week: 0.0 standard drinks    Comment: patient quit alcohol use 2005.   . Drug use: No    Family History: family history includes Cancer in her father; Heart attack in her father; Heart disease in her father and mother; Lung cancer in an other family member. There is no history of Breast cancer.  ROS:   Please see the history of  present illness.  Additional pertinent ROS: Constitutional: Negative for chills, fever, night sweats, unintentional weight loss  HENT: Negative for ear pain and hearing loss.   Eyes: Negative for loss of vision and eye pain.  Respiratory: Negative for cough, sputum, wheezing.   Cardiovascular: See HPI. Gastrointestinal: Negative for abdominal pain, melena, and hematochezia.  Genitourinary: Negative for dysuria and hematuria.  Musculoskeletal: Negative for falls and myalgias.  Skin: Negative for itching and rash.  Neurological: Negative for new focal weakness, focal sensory changes and loss of consciousness.  Endo/Heme/Allergies: Does not bruise/bleed easily.     EKGs/Labs/Other Studies Reviewed:    The following studies were reviewed today: Echo 01/30/14 Left ventricle: The cavity size was normal. Wall thickness was  normal. Systolic function was normal. The estimated ejection  fraction was in the range of 60% to 65%. Wall motion was normal;  there were no regional wall motion abnormalities. Doppler  parameters are consistent with abnormal left ventricular  relaxation (grade 1 diastolic dysfunction). The E/e&' ratio is <8,  suggesting normal LV filling pressure.  - Left atrium: The atrium was normal in size.  - Pericardium, extracardiac: There is a heterogenous mass inferior  to the RV which extends toward the apex and compresses the RV -  this is similar in appearance to liver and may represent  hepatomegaly or perhaps mass - consider dedicated  abdominal/thoracic imaging. There is the appearance of possible  mass invading the IVC in image 71. There was no pericardial  effusion.   Impressions:   - No evidence for cardiomegaly. Heterogenous mass comressing the RV  and possibly invading the IVC seen in subcostal images. Recommend  dedicated thoracoabdominal imaging.   CT angio chest/abdomen 02/14/14 IMPRESSION:  1. No aortic dissection. No aneurysm.   2. No acute findings in the chest abdomen.  3. No abdominal mass.   EKG:  EKG is personally reviewed.  The ekg ordered today demonstrates NSR at 87 bpm  Recent Labs: 03/11/2020: ALT 29; BUN 19; Creatinine, Ser 0.66; Hemoglobin 12.2; Platelets 227; Potassium 4.1; Sodium 142; TSH 1.030  Recent Lipid Panel    Component Value Date/Time   CHOL 144 03/11/2020 1442   TRIG 104 03/11/2020 1442   HDL 62 03/11/2020 1442   CHOLHDL 2.3 03/11/2020 1442   CHOLHDL 2.8 11/23/2009 0435   VLDL 15 11/23/2009 0435   LDLCALC 63 03/11/2020 1442    Physical Exam:    VS:  BP 118/72   Pulse 87   Temp (!) 97 F (36.1 C)   Ht 5\' 2"  (1.575 m)   Wt 155 lb (70.3 kg)   SpO2 95%   BMI 28.35 kg/m     Wt Readings from Last 3 Encounters:  04/09/20 155 lb 12.8 oz (70.7 kg)  03/26/20 154 lb (69.9 kg)  03/11/20 157 lb (71.2 kg)    GEN: Well nourished, well developed in no acute distress HEENT: Normal, moist mucous membranes NECK: No JVD CARDIAC: regular rhythm, normal S1 and S2, no rubs or gallops. No murmurs. VASCULAR: Radial and DP pulses 2+ bilaterally. No carotid bruits RESPIRATORY:  Clear to auscultation without rales, wheezing or rhonchi  ABDOMEN: Soft, non-tender, non-distended MUSCULOSKELETAL:  Ambulates independently with cane SKIN: Warm and dry, no edema NEUROLOGIC:  Alert and oriented x 3. Walks with cane PSYCHIATRIC:  Normal affect    ASSESSMENT:    1. Precordial pain   2. History of CVA (cerebrovascular accident)   3. Aortic atherosclerosis ([place])   4. Essential hypertension   5. Hyperlipidemia, unspecified hyperlipidemia type   6. Family history of heart disease   7. Cardiac risk counseling   8. Counseling on health promotion and disease prevention    PLAN:    Dyspnea on exertion: concern for anginal equivalent -discussed treadmill stress, nuclear stress/lexiscan, and CT coronary angiography. Discussed pros and cons of each, including but not limited to false positive/false  negative risk, radiation risk, and risk of IV contrast dye. Based on shared decision making, decision was made to pursue lexiscan myoview stress test -risk factors include family history of CV disease, former tobacco use, hypertension, hyperlipidemia, and history of CVA  Hypertension -continue telmisartan  Hyperlipidemia -continue rosuvastatin  History of CVA: -secondary prevention -continue aspirin, clopidogrel per neurology  Cardiac risk counseling and prevention recommendations: -recommend heart healthy/Mediterranean diet, with whole grains, fruits, vegetable, fish, lean meats, nuts, and olive oil. Limit salt. -recommend moderate walking, 3-5 times/week for 30-50 minutes each session. Aim for at least 150 minutes.week. Goal should be pace of 3 miles/hours, or walking 1.5 miles in 30 minutes -recommend avoidance of tobacco products. Avoid excess alcohol.  Plan for follow up: 3 mos or sooner based on results of test  Buford Dresser, MD, PhD [place]  [facility]    Medication Adjustments/Labs and Tests Ordered: Current medicines are reviewed at length with the patient today.  Concerns regarding medicines are outlined above.  Orders Placed This Encounter  Procedures  . MYOCARDIAL PERFUSION IMAGING  . EKG 12-Lead   No orders of the defined types were placed in this encounter.   Patient Instructions  Medication Instructions:  Your Physician recommend you continue on your current medication as directed.    Please call us back and let us know if you are on both the losartan 100 mg dose and the telmisartan 20 mg dose  *If you need a refill on your  cardiac medications before your next appointment, please call your pharmacy*   Lab Work: None   Testing/Procedures: Your physician has requested that you have a lexiscan myoview. For further information please visit [email or web address]. Please follow instruction sheet, as given. [place]. [address]    Follow-Up: At [facility], you and your health needs are our priority.  As part of our continuing mission to provide you with exceptional heart care, we have created designated Provider Care Teams.  These Care Teams include your primary Cardiologist (physician) and Advanced Practice Providers (APPs -  Physician Assistants and Nurse Practitioners) who all work together to provide you with the care you need, when you need it.  We recommend signing up for the patient portal called "MyChart".  Sign up information is provided on this After Visit Summary.  MyChart is used to connect with patients for Virtual Visits (Telemedicine).  Patients are able to view lab/test results, encounter notes, upcoming appointments, etc.  Non-urgent messages can be sent to your provider as well.   To learn more about what you can do with MyChart, go to [email or web address].    Your next appointment:   3 month(s)  The format for your next appointment:   In Person  Provider:   Buford Dresser, MD  You are scheduled for a Myocardial Perfusion Imaging Study on.  Please arrive 15 minutes prior to your appointment time for registration and insurance purposes.  The test will take approximately 3 to 4 hours to complete; you may bring reading material.  If someone comes with you to your appointment, they will need to remain in the main lobby due to limited space in the testing area. **If you are pregnant or breastfeeding, please notify the nuclear lab prior to your appointment**  How to prepare for your Myocardial Perfusion Test: . Do not eat or drink 3 hours prior to your test, except you may have water. . Do not consume products containing caffeine (regular or decaffeinated) 12 hours prior to your test. (ex: coffee, chocolate, sodas, tea). . Do bring a list of your current medications with you.  If not listed below, you may take your medications as normal. . Do wear comfortable clothes (no dresses or  overalls) and walking shoes, tennis shoes preferred (No heels or open toe shoes are allowed). . Do NOT wear cologne, perfume, aftershave, or lotions (deodorant is allowed). . If these instructions are not followed, your test will have to be rescheduled.  Please report to [place], [address] for your test.  If you have questions or concerns about your appointment, you can call the Nuclear Lab at [phone].  If you cannot keep your appointment, please provide 24 hours notification to the Nuclear Lab, to avoid a possible $50 charge to your account.    Signed, Buford Dresser, MD PhD 04/20/2020  [place]

## 2020-04-24 ENCOUNTER — Other Ambulatory Visit: Payer: Self-pay

## 2020-04-24 ENCOUNTER — Encounter (HOSPITAL_BASED_OUTPATIENT_CLINIC_OR_DEPARTMENT_OTHER): Payer: Medicare Other | Admitting: Physical Medicine & Rehabilitation

## 2020-04-24 ENCOUNTER — Telehealth (HOSPITAL_COMMUNITY): Payer: Self-pay

## 2020-04-24 ENCOUNTER — Encounter: Payer: Self-pay | Admitting: Physical Medicine & Rehabilitation

## 2020-04-24 VITALS — BP 132/76 | HR 102 | Temp 98.1°F | Ht 62.0 in | Wt 155.8 lb

## 2020-04-24 DIAGNOSIS — G8110 Spastic hemiplegia affecting unspecified side: Secondary | ICD-10-CM

## 2020-04-24 DIAGNOSIS — I69351 Hemiplegia and hemiparesis following cerebral infarction affecting right dominant side: Secondary | ICD-10-CM | POA: Diagnosis not present

## 2020-04-24 NOTE — Progress Notes (Signed)
  Botox Injection for spasticity using needle EMG guidance  Dilution: 50 Units/ml Indication: Severe spasticity which interferes with ADL,mobility and/or  hygiene and is unresponsive to medication management and other conservative care Informed consent was obtained after describing risks and benefits of the procedure with the patient. This includes bleeding, bruising, infection, excessive weakness, or medication side effects. A REMS form is on file and signed. Needle: 25 g 2"  needle electrode Number of units per muscle Right posterior tibialis 75 units Right FDL 75 units Right FHL 50 units All injections were done after obtaining appropriate EMG activity and after negative drawback for blood. The patient tolerated the procedure well. Post procedure instructions were given. A followup appointment was made.  

## 2020-04-24 NOTE — Telephone Encounter (Signed)
Encounter complete. 

## 2020-04-29 ENCOUNTER — Other Ambulatory Visit: Payer: Self-pay

## 2020-04-29 ENCOUNTER — Ambulatory Visit (HOSPITAL_COMMUNITY)
Admission: RE | Admit: 2020-04-29 | Discharge: 2020-04-29 | Disposition: A | Discharge status: Home / Self Care | Payer: Medicare Other | Source: Ambulatory Visit | Attending: Internal Medicine | Admitting: Internal Medicine

## 2020-04-29 DIAGNOSIS — R072 Precordial pain: Secondary | ICD-10-CM | POA: Diagnosis not present

## 2020-04-29 LAB — MYOCARDIAL PERFUSION IMAGING
LV dias vol: 42 mL (ref 46–106)
LV sys vol: 11 mL
Rest HR: 77 {beats}/min
SDS: 2
SRS: 3
SSS: 5
TID: 1.34

## 2020-04-29 MED ORDER — TECHNETIUM TC 99M TETROFOSMIN IV KIT
10.9000 | PACK | Freq: Once | INTRAVENOUS | Status: AC | PRN
  Administered 2020-04-29: 10.9 via INTRAVENOUS
  Filled 2020-04-29: qty 11

## 2020-04-29 MED ORDER — TECHNETIUM TC 99M TETROFOSMIN IV KIT
30.2000 | PACK | Freq: Once | INTRAVENOUS | Status: AC | PRN
  Administered 2020-04-29: 30.2 via INTRAVENOUS
  Filled 2020-04-29: qty 31

## 2020-04-29 MED ORDER — ADENOSINE (DIAGNOSTIC) 3 MG/ML IV SOLN
0.5600 mg/kg | Freq: Once | INTRAVENOUS | Status: AC
  Administered 2020-04-29: 39.3 mg via INTRAVENOUS

## 2020-04-30 ENCOUNTER — Telehealth: Payer: Medicare Other

## 2020-05-13 ENCOUNTER — Other Ambulatory Visit: Payer: Self-pay | Admitting: Internal Medicine

## 2020-05-13 DIAGNOSIS — E039 Hypothyroidism, unspecified: Secondary | ICD-10-CM

## 2020-05-13 DIAGNOSIS — Z0000 Encounter for general adult medical examination without abnormal findings: Secondary | ICD-10-CM

## 2020-05-15 DIAGNOSIS — Z20828 Contact with and (suspected) exposure to other viral communicable diseases: Secondary | ICD-10-CM | POA: Diagnosis not present

## 2020-05-22 ENCOUNTER — Other Ambulatory Visit: Payer: Self-pay | Admitting: Internal Medicine

## 2020-05-26 DIAGNOSIS — H35033 Hypertensive retinopathy, bilateral: Secondary | ICD-10-CM | POA: Diagnosis not present

## 2020-05-26 DIAGNOSIS — H353211 Exudative age-related macular degeneration, right eye, with active choroidal neovascularization: Secondary | ICD-10-CM | POA: Diagnosis not present

## 2020-05-26 DIAGNOSIS — H2513 Age-related nuclear cataract, bilateral: Secondary | ICD-10-CM | POA: Diagnosis not present

## 2020-05-26 DIAGNOSIS — H353122 Nonexudative age-related macular degeneration, left eye, intermediate dry stage: Secondary | ICD-10-CM | POA: Diagnosis not present

## 2020-05-28 ENCOUNTER — Other Ambulatory Visit: Payer: Self-pay

## 2020-05-28 ENCOUNTER — Ambulatory Visit
Admission: RE | Admit: 2020-05-28 | Discharge: 2020-05-28 | Disposition: A | Discharge status: Home / Self Care | Payer: Medicare Other | Source: Ambulatory Visit | Attending: Internal Medicine | Admitting: Internal Medicine

## 2020-05-28 DIAGNOSIS — Z0000 Encounter for general adult medical examination without abnormal findings: Secondary | ICD-10-CM

## 2020-05-28 DIAGNOSIS — Z1231 Encounter for screening mammogram for malignant neoplasm of breast: Secondary | ICD-10-CM | POA: Diagnosis not present

## 2020-06-02 ENCOUNTER — Telehealth: Payer: Medicare Other

## 2020-06-02 ENCOUNTER — Other Ambulatory Visit: Payer: Self-pay

## 2020-06-02 ENCOUNTER — Ambulatory Visit: Payer: Self-pay

## 2020-06-02 DIAGNOSIS — E039 Hypothyroidism, unspecified: Secondary | ICD-10-CM

## 2020-06-02 DIAGNOSIS — I10 Essential (primary) hypertension: Secondary | ICD-10-CM

## 2020-06-02 DIAGNOSIS — H3530 Unspecified macular degeneration: Secondary | ICD-10-CM

## 2020-06-02 DIAGNOSIS — F331 Major depressive disorder, recurrent, moderate: Secondary | ICD-10-CM

## 2020-06-03 NOTE — Patient Instructions (Signed)
Visit Information  Goals Addressed      Patient Stated   .  COMPLETED: "to improve my mood" (pt-stated)        CARE PLAN ENTRY (see longtitudinal plan of care for additional care plan information)  Current Barriers:  Marland Kitchen Knowledge Deficits related to disease process and Self Health management of depression and anxiety . Chronic Disease Management support and education needs related to Essential HTN, Moderate episode of recurrent major depressive disorder, COPD, Macular Degeneration of right eye  Nurse Case Manager Clinical Goal(s):  Marland Kitchen Over the next 90 days, patient will work with the [place] CM and PCP to address needs related to disease education and support for improved Self Health management of depression and anxiety  CCM RN CM Interventions:  06/02/20 call completed with patient . Spoke with patient today for a CCM RN CM care plan update; determined patient would like to opt out of the embedded CCM program at this time due stating, "I don't feel that I need these services at this time".   Patient Self Care Activities:  . Self administers medications as prescribed . Attends all scheduled provider appointments . Calls pharmacy for medication refills . Performs ADL's independently . Performs IADL's independently . Calls provider office for new concerns or questions  Please see past updates related to this goal by clicking on the "Past Updates" button in the selected goal      .  COMPLETED: "to improve the vision in my right eye" (pt-stated)        [place] (see longtitudinal plan of care for additional care plan information)  Current Barriers:  Marland Kitchen Knowledge Deficits related to disease process and Self Health management of Macular Degeneration to both eyes . Chronic Disease Management support and education needs related to Essential HTN, COPD, Macular degeneration, depression   Nurse Case Manager Clinical Goal(s):  Marland Kitchen Over the next 90 days, patient will work with CCM RN CM  and PCP to address needs related to disease education and support to improve Self Health management of Macular Degeneration   CCM RN CM Interventions:  06/02/20 call completed with patient . Spoke with patient today for a CCM RN CM care plan update; determined patient would like to opt out of the embedded CCM program at this time due stating, "I don't feel that I need these services at this time".   Patient Self Care Activities:  . Self administers medications as prescribed . Attends all scheduled provider appointments . Calls pharmacy for medication refills . Performs ADL's independently . Performs IADL's independently . Calls provider office for new concerns or questions  Please see past updates related to this goal by clicking on the "Past Updates" button in the selected goal      .  COMPLETED: "to keep my Asthma under good control" (pt-stated)        CARE PLAN ENTRY (see longtitudinal plan of care for additional care plan information)  Current Barriers:  Marland Kitchen Knowledge Deficits related to evaluation and treatment of COPD/Asthma . Chronic Disease Management support and education needs related to Essential HTN, Moderate episode of recurrent major depressive disorder, COPD, Macular Degeneration of right eye  Nurse Case Manager Clinical Goal(s):  Marland Kitchen Over the next 90 days, patient will work with the CCM team and Pulmonology to address needs related to disease education and support for improved Self Health management of COPD/Asthma  CCM RN CM Interventions:  06/02/20 call completed with patient . Spoke with patient today  for a CCM RN CM care plan update; determined patient would like to opt out of the embedded CCM program at this time due stating, "I don't feel that I need these services at this time".   Patient Self Care Activities:  . Self administers medications as prescribed . Attends all scheduled provider appointments . Calls pharmacy for medication refills . Performs ADL's  independently . Performs IADL's independently . Calls provider office for new concerns or questions  Please see past updates related to this goal by clicking on the "Past Updates" button in the selected goal      .  COMPLETED: "to keep my BP under good control" (pt-stated)        CARE PLAN ENTRY (see longtitudinal plan of care for additional care plan information)  Current Barriers:  Marland Kitchen Knowledge Deficits related to disease education and Self Health management of HTN . Chronic Disease Management support and education needs related to Essential HTN, Moderate episode of recurrent major depressive disorder, COPD, Macular Degeneration of right eye  Nurse Case Manager Clinical Goal(s):  Marland Kitchen Over the next 90 days, patient will work with CCM RN CM and PCP to address needs related to disease education and support improved Self Health management of HTN   CCM RN CM Interventions:  06/02/20 call completed with patient . Spoke with patient today for a CCM RN CM care plan update; determined patient would like to opt out of the embedded CCM program at this time due stating, "I don't feel that I need these services at this time".   Patient Self Care Activities:  . Self administers medications as prescribed . Attends all scheduled provider appointments . Calls pharmacy for medication refills . Performs ADL's independently . Performs IADL's independently . Calls provider office for new concerns or questions  Please see past updates related to this goal by clicking on the "Past Updates" button in the selected goal        Patient verbalizes understanding of instructions provided today.   No further follow up required. CCM enrollment status changed to "previously enrolled" as per patient request on 06/03/20 to discontinue enrollment. Case closed to case management services in primary care home.   Barb Merino, RN, BSN, CCM Care Management Coordinator [place] [facility]  Direct Phone: [phone]

## 2020-06-03 NOTE — Chronic Care Management (AMB) (Signed)
Chronic Care Management   Follow Up Note   06/02/2020 Name: ALEXI DORMINEY MRN: [id number] DOB: 1956/04/21  Referred by: Glendale Chard, MD Reason for referral : Chronic Care Management (FU RN CM Call )   RENATA GAMBINO is a 64 y.o. year old female who is a primary care patient of Glendale Chard, MD. The CCM team was consulted for assistance with chronic disease management and care coordination needs.    Review of patient status, including review of consultants reports, relevant laboratory and other test results, and collaboration with appropriate care team members and the patient's provider was performed as part of comprehensive patient evaluation and provision of chronic care management services.    SDOH (Social Determinants of Health) assessments performed: No See Care Plan activities for detailed interventions related to [facility])   Spoke with patient today for a CCM RN CM care plan update; determined patient would like to opt out of the embedded CCM program at this time due stating, "I don't feel that I need these services at this time". CCM enrollment status changed to "previously enrolled" as per patient request on 06/03/20 to discontinue enrollment. Case closed to case management services in primary care home.   Outpatient Encounter Medications as of 06/02/2020  Medication Sig Note  . albuterol (PROVENTIL) (2.5 MG/3ML) 0.083% nebulizer solution Take 3 mLs (2.5 mg total) by nebulization every 6 (six) hours.   Marland Kitchen albuterol (VENTOLIN HFA) 108 (90 Base) MCG/ACT inhaler Inhale 2 puffs into the lungs every 6 (six) hours as needed for wheezing or shortness of breath.   . ALPRAZolam (XANAX) 0.25 MG tablet TAKE 1 TABLET TWICE A DAY AS NEEDED   . aspirin EC 81 MG tablet Take 81 mg by mouth daily.   . Biotin 1 MG CAPS Take by mouth.   . Cholecalciferol (VITAMIN D-3) 1000 units CAPS Take 1,000 Units by mouth daily.   . citalopram (CELEXA) 40 MG tablet Take 1 tablet (40 mg total) by mouth  at bedtime.   . clopidogrel (PLAVIX) 75 MG tablet TAKE 1 TABLET DAILY   . CRANBERRY PO Take 2 tablets by mouth 2 (two) times daily. 09/18/2018: 09/18/18 take one tab two x daily  . cyclobenzaprine (FLEXERIL) 10 MG tablet TAKE 1 TABLET AT BEDTIME AS NEEDED   . diclofenac Sodium (VOLTAREN) 1 % GEL Voltaren 1 % topical gel  APPLY 2 GRAM TO THE AFFECTED AREA(S) BY TOPICAL ROUTE 4 TIMES PER DAY   . fluticasone (FLONASE) 50 MCG/ACT nasal spray Place 2 sprays into both nostrils daily as needed (allergies or post-nasl drip).  11/13/2017: New order; not yet started  . levETIRAcetam (KEPPRA) 500 MG tablet Take 1 tablet (500 mg total) by mouth every 12 (twelve) hours.   Marland Kitchen levothyroxine (SYNTHROID) 50 MCG tablet TAKE 1 TABLET ON MONDAY THROUGH WEDNESDAY   . levothyroxine (SYNTHROID) 75 MCG tablet TAKE 1 TABLET IN THE MORNING BEFORE BREAKFAST ON THURSDAY THROUGH SUNDAY (TAKE 50 MCG ON OTHER DAYS)   . losartan (COZAAR) 100 MG tablet Take 100 mg by mouth every morning.    . Multiple Vitamins-Minerals (PRESERVISION/LUTEIN PO) Take by mouth.   . NON FORMULARY preser vision 2 tabs daily   . NONFORMULARY OR COMPOUNDED ITEM Apply 240 mg topically daily as needed.   . Omega-3 Fatty Acids (FISH OIL) 1000 MG CAPS Take 2,000 mg by mouth 2 (two) times daily.    . rosuvastatin (CRESTOR) 10 MG tablet TAKE 1 TABLET DAILY   . telmisartan (MICARDIS) 20 MG tablet  Take 1 tablet (20 mg total) by mouth daily.   Marland Kitchen topiramate (TOPAMAX) 25 MG tablet Take 1 tablet (25 mg total) by mouth 2 (two) times daily.   . vitamin C (ASCORBIC ACID) 500 MG tablet Take 500 mg by mouth daily.   . vitamin E 1000 UNIT capsule Take 1,000 Units by mouth at bedtime.    No facility-administered encounter medications on file as of 06/02/2020.     Objective: Lab Results  Component Value Date   HGBA1C 5.7 (H) 03/11/2020   HGBA1C 5.9 (H) 06/11/2019   HGBA1C 5.8 (H) 01/02/2019   Lab Results  Component Value Date   MICROALBUR 30 09/11/2019   LDLCALC  63 03/11/2020   CREATININE 0.66 03/11/2020   BP Readings from Last 3 Encounters:  04/24/20 132/76  04/20/20 118/72  04/09/20 117/78     Goals Addressed      Patient Stated   .  COMPLETED: "to improve my mood" (pt-stated)        CARE PLAN ENTRY (see longtitudinal plan of care for additional care plan information)  Current Barriers:  Marland Kitchen Knowledge Deficits related to disease process and Self Health management of depression and anxiety . Chronic Disease Management support and education needs related to Essential HTN, Moderate episode of recurrent major depressive disorder, COPD, Macular Degeneration of right eye  Nurse Case Manager Clinical Goal(s):  Marland Kitchen Over the next 90 days, patient will work with the [place] CM and PCP to address needs related to disease education and support for improved Self Health management of depression and anxiety  CCM RN CM Interventions:  06/02/20 call completed with patient . Spoke with patient today for a CCM RN CM care plan update; determined patient would like to opt out of the embedded CCM program at this time due stating, "I don't feel that I need these services at this time".   Patient Self Care Activities:  . Self administers medications as prescribed . Attends all scheduled provider appointments . Calls pharmacy for medication refills . Performs ADL's independently . Performs IADL's independently . Calls provider office for new concerns or questions  Please see past updates related to this goal by clicking on the "Past Updates" button in the selected goal      .  COMPLETED: "to improve the vision in my right eye" (pt-stated)        [place] (see longtitudinal plan of care for additional care plan information)  Current Barriers:  Marland Kitchen Knowledge Deficits related to disease process and Self Health management of Macular Degeneration to both eyes . Chronic Disease Management support and education needs related to Essential HTN, COPD, Macular  degeneration, depression   Nurse Case Manager Clinical Goal(s):  Marland Kitchen Over the next 90 days, patient will work with CCM RN CM and PCP to address needs related to disease education and support to improve Self Health management of Macular Degeneration   CCM RN CM Interventions:  06/02/20 call completed with patient . Spoke with patient today for a CCM RN CM care plan update; determined patient would like to opt out of the embedded CCM program at this time due stating, "I don't feel that I need these services at this time".   Patient Self Care Activities:  . Self administers medications as prescribed . Attends all scheduled provider appointments . Calls pharmacy for medication refills . Performs ADL's independently . Performs IADL's independently . Calls provider office for new concerns or questions  Please see past updates related  to this goal by clicking on the "Past Updates" button in the selected goal      .  COMPLETED: "to keep my Asthma under good control" (pt-stated)        [place] (see longtitudinal plan of care for additional care plan information)  Current Barriers:  Marland Kitchen Knowledge Deficits related to evaluation and treatment of COPD/Asthma . Chronic Disease Management support and education needs related to Essential HTN, Moderate episode of recurrent major depressive disorder, COPD, Macular Degeneration of right eye  Nurse Case Manager Clinical Goal(s):  Marland Kitchen Over the next 90 days, patient will work with the CCM team and Pulmonology to address needs related to disease education and support for improved Self Health management of COPD/Asthma  CCM RN CM Interventions:  06/02/20 call completed with patient . Spoke with patient today for a CCM RN CM care plan update; determined patient would like to opt out of the embedded CCM program at this time due stating, "I don't feel that I need these services at this time".   Patient Self Care Activities:  . Self administers medications  as prescribed . Attends all scheduled provider appointments . Calls pharmacy for medication refills . Performs ADL's independently . Performs IADL's independently . Calls provider office for new concerns or questions  Please see past updates related to this goal by clicking on the "Past Updates" button in the selected goal      .  COMPLETED: "to keep my BP under good control" (pt-stated)        CARE PLAN ENTRY (see longtitudinal plan of care for additional care plan information)  Current Barriers:  Marland Kitchen Knowledge Deficits related to disease education and Self Health management of HTN . Chronic Disease Management support and education needs related to Essential HTN, Moderate episode of recurrent major depressive disorder, COPD, Macular Degeneration of right eye  Nurse Case Manager Clinical Goal(s):  Marland Kitchen Over the next 90 days, patient will work with CCM RN CM and PCP to address needs related to disease education and support improved Self Health management of HTN   CCM RN CM Interventions:  06/02/20 call completed with patient . Spoke with patient today for a CCM RN CM care plan update; determined patient would like to opt out of the embedded CCM program at this time due stating, "I don't feel that I need these services at this time".    Patient Self Care Activities:  . Self administers medications as prescribed . Attends all scheduled provider appointments . Calls pharmacy for medication refills . Performs ADL's independently . Performs IADL's independently . Calls provider office for new concerns or questions  Please see past updates related to this goal by clicking on the "Past Updates" button in the selected goal        Plan:   No further follow up required  Barb Merino, RN, BSN, CCM Care Management Coordinator [place] [facility]  Direct Phone: [phone]

## 2020-06-04 ENCOUNTER — Other Ambulatory Visit: Payer: Self-pay | Admitting: Internal Medicine

## 2020-06-05 ENCOUNTER — Encounter: Payer: Self-pay | Admitting: Physical Medicine & Rehabilitation

## 2020-06-05 ENCOUNTER — Other Ambulatory Visit: Payer: Self-pay

## 2020-06-05 ENCOUNTER — Encounter: Payer: Medicare Other | Attending: Physical Medicine & Rehabilitation | Admitting: Physical Medicine & Rehabilitation

## 2020-06-05 VITALS — BP 119/76 | HR 91 | Temp 98.6°F | Ht 62.0 in | Wt 154.2 lb

## 2020-06-05 DIAGNOSIS — G8110 Spastic hemiplegia affecting unspecified side: Secondary | ICD-10-CM | POA: Insufficient documentation

## 2020-06-05 NOTE — Progress Notes (Signed)
Subjective:    Patient ID: [name], female    DOB: Dec 27, 1955, 64 y.o.   MRN: [id number] 64 yo female with SAH in 2011 which resulted from a probable aneurysmal bleed.  The patient had MI and stent placement left PICA in 2014.  She has had residual right-sided weakness, decreased coordination as well as increased tightness below the knee on the right side. 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  Pt states her big toe no longer curls- "it stays flat". The patient feels like the injection may be starting to wear off a bit. The patient ambulates with a cane, she does not use any type of orthotic device   04/24/2020  Right posterior tibialis 75 units Right FDL 75 units Right FHL 50 units Pain Inventory Average Pain 5 Pain Right Now 5 My pain is constant, burning, dull, tingling and aching  LOCATION OF PAIN  All on the right-side, hand, neck, foot, toes, arm & leg.  BOWEL Number of stools per week:3-4 Oral laxative use Metamucil FBER Type of laxative  Enema or suppository use No  History of colostomy No  Incontinent No   BLADDER Pads In and out cath, frequency - None Able to self cath N/A Bladder incontinence Yes  Frequent urination No  Leakage with coughing Yes  Difficulty starting stream No  Incomplete bladder emptying No    Mobility use a cane use a walker how many minutes can you walk? 20-30 mins ability to climb steps?  yes do you drive?  yes use a wheelchair Do you have any goals in this area?  yes  Function disabled: date disabled 11/27/2009 I need assistance with the following:  meal prep, household duties and shopping Do you have any goals in this area?  yes  Neuro/Psych bladder control problems weakness numbness trouble walking dizziness confusion depression anxiety  Prior Studies Any changes since last visit?  no  Physicians involved  in your care Any changes since last visit?  no   Family History  Problem Relation Age of Onset  . Heart disease Mother   . Heart disease Father   . Heart attack Father   . Cancer Father        prostate to lymph node  . Lung cancer Other   . Breast cancer Neg Hx    Social History   Socioeconomic History  . Marital status: Married    Spouse name: Jori Moll  . Number of children: 2  . Years of education: 60  . Highest education level: Not on file  Occupational History  . 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 Smoking status: Former Smoker    Packs/day: 2.00    Years: 30.00    Pack years: 60.00    Types: Cigarettes    Quit date: 11/27/2009    Years since quitting: 10.5  . Smokeless tobacco: Never Used  . Tobacco comment: smoked 2.5 ppd x 5 years, 1ppd x 20+ years  Vaping Use  . Vaping Use: Never used  Substance and Sexual Activity  . Alcohol use: No    Alcohol/week: 0.0 standard drinks    Comment: patient quit alcohol use 2005.   . Drug use: No  . Sexual activity: Not Currently  Other Topics Concern  . Not on file  Social History Narrative   Patient lives at home with her husband (  Jori Moll).   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    Patient is on disability.    Patient has an Associates degree.   Patient is right-handed.   Patient drinks 2 cups of green tea most days.               Social Determinants of Health   Financial Resource Strain: Low Risk   . Difficulty of Paying Living Expenses: Not hard at all  Food Insecurity: No Food Insecurity  . Worried About Charity fundraiser in the Last Year: Never true  . Ran Out of Food in the Last Year: Never true  Transportation Needs: No Transportation Needs  . Lack of Transportation (Medical): No  . Lack of Transportation (Non-Medical): No  Physical Activity: Inactive  . Days of Exercise per Week: 0 days  . Minutes of Exercise per  Session: 0 min  Stress: No Stress Concern Present  . Feeling of Stress : Only a little  Social Connections:   . Frequency of Communication with Friends and Family: Not on file  . Frequency of Social Gatherings with Friends and Family: Not on file  . Attends Religious Services: Not on file  . Active Member of Clubs or Organizations: Not on file  . Attends Archivist Meetings: Not on file  . Marital Status: Not on file   Past Surgical History:  Procedure Laterality Date  . BACK SURGERY    . CEREBRAL ANGIOGRAM     x3  . COLONOSCOPY WITH PROPOFOL N/A 08/18/2016   Procedure: COLONOSCOPY WITH PROPOFOL;  Surgeon: Carol Ada, MD;  Location: [facility];  Service: Endoscopy;  Laterality: N/A;  . LAPAROSCOPIC HYSTERECTOMY    . MANDIBLE FRACTURE SURGERY    . RADIOLOGY WITH ANESTHESIA N/A 08/21/2013   Procedure: RADIOLOGY WITH ANESTHESIA;  Surgeon: Rob Hickman, MD;  Location: [place];  Service: Radiology;  Laterality: N/A;  . stones     no surgery   Past Medical History:  Diagnosis Date  . Abnormal gait   . Amnesia   . Anemia    hx  . Aneurysm ([place])   . Anxiety   . Asthma   . Atrophic vaginitis   . Benign hypertensive heart disease   . Cardiomegaly   . Chest pain   . COPD (chronic obstructive pulmonary disease) ([place])     - PFTs  01/24/06 FEV1 69% ratio 68% diffusing capacity 57% with no improvement after B2   - PFT's 10/1/ 09 FEV1 77   ratio  76    dlco                   45            no resp to B2    - Nl alpha one antitripsin level 10/09  . Cough   . Depression   . Difficulty speaking   . Disorder of nasal cavity   . Dyspnea   . Enthesopathy of elbow region   . GERD (gastroesophageal reflux disease)   . Hammer toe   . Headache(784.0)   . Hemiparesis affecting dominant side as late effect of cerebrovascular accident ([place])   . History of kidney stones   . Hyperlipidemia   . Hypertension   . Hypothyroidism   . Increased urinary frequency   . Low compliance  bladder   . Macular degeneration of both eyes   . Malaise and fatigue   .  Nonruptured cerebral aneurysm   . Obesity   . Pneumonia    hx  . Pure hypercholesterolemia   . Recurrent major depressive episodes ([place])   . Seizure disorder, secondary ([place])    2-3 focal siezures monthly  . Seizures ([place])   . Short-term memory loss    since stroke  . Skin sensation disturbance   . Stroke [facility]) 2011   rt sided weakness  . Swelling of limb   . TMJ (temporomandibular joint disorder)   . Urinary tract infectious disease   . Vertigo as late effect of stroke   . Vitamin D deficiency    BP 119/76   Pulse 91   Temp 98.6 F (37 C)   Ht $R'5\' 2"'vr$  (1.575 m)   Wt 154 lb 3.2 oz (69.9 kg)   SpO2 95%   BMI 28.20 kg/m   Opioid Risk Score:   Fall Risk Score:  `1  Depression screen PHQ 2/9  Depression screen [facility] 2/9 06/05/2020 04/09/2020 03/11/2020 09/11/2019 06/11/2019 09/03/2018  Decreased Interest 0 0 0 0 0 0  Down, Depressed, Hopeless 0 1 0 0 0 0  PHQ - 2 Score 0 1 0 0 0 0  Altered sleeping - $RemoveBe'1 3 3 'KhNxwHVEa$ - -  Tired, decreased energy - $Remove'1 2 3 'xPUWskt$ - -  Change in appetite - 0 0 0 - -  Feeling bad or failure about yourself  - 0 0 0 - -  Trouble concentrating - 1 3 0 - -  Moving slowly or fidgety/restless - 0 0 0 - -  Suicidal thoughts - 0 0 0 - -  PHQ-9 Score - $Remov'4 8 6 'sjSgot$ - -  Difficult doing work/chores - - Not difficult at all Not difficult at all - -  Some recent data might be hidden   Review of Systems  Respiratory: Positive for cough and shortness of breath.   Cardiovascular: Positive for leg swelling.  Gastrointestinal: Positive for constipation.  Genitourinary:       Bladder leakage  Musculoskeletal: Positive for gait problem and neck pain.  Neurological: Positive for dizziness, weakness and numbness.       Tingling, burning  Hematological: Bruises/bleeds easily.  Psychiatric/Behavioral:       Anxiety, depression  All other systems reviewed and are negative.      Objective:   Physical  Exam Constitutional:      Appearance: Normal appearance.  HENT:     Head: Normocephalic and atraumatic.  Eyes:     Extraocular Movements: Extraocular movements intact.     Conjunctiva/sclera: Conjunctivae normal.     Pupils: Pupils are equal, round, and reactive to light.  Musculoskeletal:     Comments: Right ankle with decreased passive dorsiflexion. Is able to be stretched with some effort however.  Neurological:     Mental Status: She is alert and oriented to person, place, and time.     Comments: Motor strength right lower extremity 4/5 in hip flexors knee extensors 3 - at the right ankle dorsiflexor and plantar flexor, 3 - at the right toe flexors and toe extensors. Foot inversion is 3 - foot eversion to minus on the right side.  Tone MAS 3 at the ankle plantar flexors, MAS 1 at the foot inverters MAS 1 at the toe flexors  Gait using a narrow-base quad cane she is able to achieve heel strike and foot flat. There is minimal toe curling of the second through fifth toes on the right side. Foot is plantigrade. No evidence  of inversion  Psychiatric:        Mood and Affect: Mood normal.        Behavior: Behavior normal.           Assessment & Plan:  #1. Right spastic hemiplegia secondary left CVA. Did very well after botulinum toxin injection #1 approximately 6 weeks ago. Ambulation goals have been met    Repeat Botox 200Units in ~6wks Right posterior tibialis 75 units Right FDL 75 units Right FHL 50 units   2. Right ankle contracture soft tissue would benefit from PT. Do not think tone is the major source of this although may be contributory. Will check with needle EMG next time to see if there is spontaneous motor unit action potential activity during rest. If so may benefit from injection of gastrosoleus musculature as well. Have encouraged ankle stretching. Will need physical therapy after the next botulinum toxin injection  Will follow with PT for ankle contracture

## 2020-06-05 NOTE — Patient Instructions (Signed)
Will repeat botox with same dose in ~6wks , will follow with PT

## 2020-06-14 ENCOUNTER — Encounter: Payer: Self-pay | Admitting: Cardiology

## 2020-06-14 DIAGNOSIS — Z8673 Personal history of transient ischemic attack (TIA), and cerebral infarction without residual deficits: Secondary | ICD-10-CM | POA: Insufficient documentation

## 2020-06-14 DIAGNOSIS — Z8249 Family history of ischemic heart disease and other diseases of the circulatory system: Secondary | ICD-10-CM | POA: Insufficient documentation

## 2020-06-18 ENCOUNTER — Encounter: Payer: Self-pay | Admitting: Internal Medicine

## 2020-07-07 ENCOUNTER — Other Ambulatory Visit: Payer: Self-pay

## 2020-07-07 MED ORDER — DICLOFENAC SODIUM 1 % EX GEL: CUTANEOUS | 2 refills | Status: DC

## 2020-07-10 ENCOUNTER — Other Ambulatory Visit: Payer: Self-pay | Admitting: Internal Medicine

## 2020-07-11 NOTE — Telephone Encounter (Signed)
Please refill patient's prescription 

## 2020-07-15 ENCOUNTER — Other Ambulatory Visit: Payer: Self-pay

## 2020-07-15 MED ORDER — DICLOFENAC SODIUM 1 % EX GEL: CUTANEOUS | 2 refills | Status: DC

## 2020-07-21 ENCOUNTER — Encounter: Payer: Self-pay | Admitting: Cardiology

## 2020-07-21 ENCOUNTER — Ambulatory Visit (INDEPENDENT_AMBULATORY_CARE_PROVIDER_SITE_OTHER): Payer: Medicare Other | Admitting: Cardiology

## 2020-07-21 ENCOUNTER — Other Ambulatory Visit: Payer: Self-pay

## 2020-07-21 VITALS — BP 140/84 | HR 88 | Ht 62.5 in | Wt 156.2 lb

## 2020-07-21 DIAGNOSIS — I700 Atherosclerosis of aorta: Secondary | ICD-10-CM | POA: Diagnosis not present

## 2020-07-21 DIAGNOSIS — I2510 Atherosclerotic heart disease of native coronary artery without angina pectoris: Secondary | ICD-10-CM

## 2020-07-21 DIAGNOSIS — E785 Hyperlipidemia, unspecified: Secondary | ICD-10-CM

## 2020-07-21 DIAGNOSIS — Z8249 Family history of ischemic heart disease and other diseases of the circulatory system: Secondary | ICD-10-CM

## 2020-07-21 DIAGNOSIS — Z8673 Personal history of transient ischemic attack (TIA), and cerebral infarction without residual deficits: Secondary | ICD-10-CM | POA: Diagnosis not present

## 2020-07-21 DIAGNOSIS — I10 Essential (primary) hypertension: Secondary | ICD-10-CM | POA: Diagnosis not present

## 2020-07-21 NOTE — Progress Notes (Signed)
Cardiology Office Note:    Date:  07/21/2020   ID:  [name], DOB Jun 16, 1956, MRN [id number]  PCP:  Glendale Chard, MD  Cardiologist:  Buford Dresser, MD  Referring MD: Glendale Chard, MD   CC: follow up  History of Present Illness:    [name] is a 64 y.o. female with a hx of brain aneurysm, aortic atherosclerosis, hypertension, hyperlipidemia, tobacco use, COPD, prior ICH who is seen for follow up. I initially saw her 04/20/20 as a new consult at the request of Glendale Chard, MD for the evaluation and management of dyspnea on exertion.  Cardiovascular risk factors: Prior clinical ASCVD: ICH/CVA Comorbid conditions: endorses hypertension, hyperlipidemia. Denies diabetes, chronic kidney disease Metabolic syndrome/Obesity: no, BMI 28 Chronic inflammatory conditions: denies Tobacco use history: former, from age 75 until the day of her stroke in 2011. None since. Was 2 ppd at her peak. Family history: father has stents, has had open heart surgery. Has cancer now. Mother has a heart murmur. No heart surgeries that she knows of. Has a brother, has sleep apnea. No known heart issues. Prior cardiac testing and/or incidental findings on other testing (ie coronary calcium): echo 2015. CT noncon lung 04/19/19 showed aortic atherosclerosis and coronary calcification. On noncontrast CT, has prominent calcium in distal left main/proximal LAD.  Today: No recent acute changes to her health. Feels similar to prior. Reviewed results of her stress test today. No change to dyspnea on exertion.  Denies chest pain, shortness of breath at rest. No PND, orthopnea, LE edema or unexpected weight gain. No syncope or palpitations.  Past Medical History:  Diagnosis Date  . Abnormal gait   . Amnesia   . Anemia    hx  . Aneurysm ([place])   . Anxiety   . Asthma   . Atrophic vaginitis   . Benign hypertensive heart disease   . Cardiomegaly   . Chest pain   . COPD (chronic  obstructive pulmonary disease) ([place])     - PFTs  01/24/06 FEV1 69% ratio 68% diffusing capacity 57% with no improvement after B2   - PFT's 10/1/ 09 FEV1 77   ratio  76    dlco                   45            no resp to B2    - Nl alpha one antitripsin level 10/09  . Cough   . Depression   . Difficulty speaking   . Disorder of nasal cavity   . Dyspnea   . Enthesopathy of elbow region   . GERD (gastroesophageal reflux disease)   . Hammer toe   . Headache(784.0)   . Hemiparesis affecting dominant side as late effect of cerebrovascular accident ([place])   . History of kidney stones   . Hyperlipidemia   . Hypertension   . Hypothyroidism   . Increased urinary frequency   . Low compliance bladder   . Macular degeneration of both eyes   . Malaise and fatigue   . Nonruptured cerebral aneurysm   . Obesity   . Pneumonia    hx  . Pure hypercholesterolemia   . Recurrent major depressive episodes ([place])   . Seizure disorder, secondary ([place])    2-3 focal siezures monthly  . Seizures ([place])   . Short-term memory loss    since stroke  . Skin sensation disturbance   . Stroke [facility]) 2011   rt sided weakness  .  Swelling of limb   . TMJ (temporomandibular joint disorder)   . Urinary tract infectious disease   . Vertigo as late effect of stroke   . Vitamin D deficiency     Past Surgical History:  Procedure Laterality Date  . BACK SURGERY    . CEREBRAL ANGIOGRAM     x3  . COLONOSCOPY WITH PROPOFOL N/A 08/18/2016   Procedure: COLONOSCOPY WITH PROPOFOL;  Surgeon: Carol Ada, MD;  Location[facility];  Service: Endoscopy;  Laterality: N/A;  . LAPAROSCOPIC HYSTERECTOMY    . MANDIBLE FRACTURE SURGERY    . RADIOLOGY WITH ANESTHESIA N/A 08/21/2013   Procedure: RADIOLOGY WITH ANESTHESIA;  Surgeon: Rob Hickman, MD;  Location: [place];  Service: Radiology;  Laterality: N/A;  . stones     no surgery    Current Medications: Current Outpatient Medications on File Prior to Visit  Medication  Sig  . albuterol (PROVENTIL) (2.5 MG/3ML) 0.083% nebulizer solution Take 3 mLs (2.5 mg total) by nebulization every 6 (six) hours.  Marland Kitchen albuterol (VENTOLIN HFA) 108 (90 Base) MCG/ACT inhaler Inhale 2 puffs into the lungs every 6 (six) hours as needed for wheezing or shortness of breath.  . ALPRAZolam (XANAX) 0.25 MG tablet TAKE 1 TABLET TWICE A DAY AS NEEDED  . aspirin EC 81 MG tablet Take 81 mg by mouth daily.  . Biotin 1 MG CAPS Take by mouth.  . Cholecalciferol (VITAMIN D-3) 1000 units CAPS Take 1,000 Units by mouth daily.  . citalopram (CELEXA) 40 MG tablet Take 1 tablet (40 mg total) by mouth at bedtime.  . clopidogrel (PLAVIX) 75 MG tablet TAKE 1 TABLET DAILY  . CRANBERRY PO Take 2 tablets by mouth 2 (two) times daily.  . cyclobenzaprine (FLEXERIL) 10 MG tablet TAKE 1 TABLET AT BEDTIME AS NEEDED  . diclofenac Sodium (VOLTAREN) 1 % GEL Voltaren 1 % topical gel  APPLY 2 GRAM TO THE AFFECTED AREA(S) BY TOPICAL ROUTE 4 TIMES PER DAY  . levETIRAcetam (KEPPRA) 500 MG tablet Take 1 tablet (500 mg total) by mouth every 12 (twelve) hours.  Marland Kitchen levothyroxine (SYNTHROID) 50 MCG tablet TAKE 1 TABLET ON MONDAY THROUGH WEDNESDAY  . levothyroxine (SYNTHROID) 75 MCG tablet TAKE 1 TABLET IN THE MORNING BEFORE BREAKFAST ON THURSDAY THROUGH SUNDAY (TAKE 50 MCG ON OTHER DAYS)  . losartan (COZAAR) 100 MG tablet Take 100 mg by mouth every morning.   . NON FORMULARY preser vision 2 tabs daily  . Omega-3 Fatty Acids (FISH OIL) 1000 MG CAPS Take 2,000 mg by mouth 2 (two) times daily.   . rosuvastatin (CRESTOR) 10 MG tablet TAKE 1 TABLET DAILY  . telmisartan (MICARDIS) 20 MG tablet TAKE 1 TABLET DAILY  . vitamin C (ASCORBIC ACID) 500 MG tablet Take 500 mg by mouth daily.  . vitamin E 1000 UNIT capsule Take 1,000 Units by mouth at bedtime.  . fluticasone (FLONASE) 50 MCG/ACT nasal spray Place 2 sprays into both nostrils daily as needed (allergies or post-nasl drip).  (Patient not taking: Reported on 07/21/2020)  .  Multiple Vitamins-Minerals (PRESERVISION/LUTEIN PO) Take by mouth. (Patient not taking: Reported on 07/21/2020)  . NONFORMULARY OR COMPOUNDED ITEM Apply 240 mg topically daily as needed. (Patient not taking: Reported on 07/21/2020)  . topiramate (TOPAMAX) 25 MG tablet Take 1 tablet (25 mg total) by mouth 2 (two) times daily. (Patient not taking: Reported on 07/21/2020)   No current facility-administered medications on file prior to visit.     Allergies:   Phenytoin   Social  History   Tobacco Use  . Smoking status: Former Smoker    Packs/day: 2.00    Years: 30.00    Pack years: 60.00    Types: Cigarettes    Quit date: 11/27/2009    Years since quitting: 10.6  . Smokeless tobacco: Never Used  . Tobacco comment: smoked 2.5 ppd x 5 years, 1ppd x 20+ years  Vaping Use  . Vaping Use: Never used  Substance Use Topics  . Alcohol use: No    Alcohol/week: 0.0 standard drinks    Comment: patient quit alcohol use 2005.   . Drug use: No    Family History: family history includes Cancer in her father; Heart attack in her father; Heart disease in her father and mother; Lung cancer in an other family member. There is no history of Breast cancer.  ROS:   Please see the history of present illness.  Additional pertinent ROS otherwise unremarkable.  EKGs/Labs/Other Studies Reviewed:    The following studies were reviewed today: Lexiscan myoview 04/29/20  The left ventricular ejection fraction is hyperdynamic (>65%).  Nuclear stress EF: 73%.  The TID is increased at 1.34 but visually there does not appear to be transient ischemic dilatation.  There was no ST segment deviation noted during stress.  The perfusion study is normal.  This is a low risk study.  Echo 01/30/14 Left ventricle: The cavity size was normal. Wall thickness was  normal. Systolic function was normal. The estimated ejection  fraction was in the range of 60% to 65%. Wall motion was normal;  there were no  regional wall motion abnormalities. Doppler  parameters are consistent with abnormal left ventricular  relaxation (grade 1 diastolic dysfunction). The E/e&' ratio is <8,  suggesting normal LV filling pressure.  - Left atrium: The atrium was normal in size.  - Pericardium, extracardiac: There is a heterogenous mass inferior  to the RV which extends toward the apex and compresses the RV -  this is similar in appearance to liver and may represent  hepatomegaly or perhaps mass - consider dedicated  abdominal/thoracic imaging. There is the appearance of possible  mass invading the IVC in image 71. There was no pericardial  effusion.   Impressions:   - No evidence for cardiomegaly. Heterogenous mass comressing the RV  and possibly invading the IVC seen in subcostal images. Recommend  dedicated thoracoabdominal imaging.   CT angio chest/abdomen 02/14/14 IMPRESSION:  1. No aortic dissection. No aneurysm.  2. No acute findings in the chest abdomen.  3. No abdominal mass.   EKG:  EKG is personally reviewed.  The ekg ordered 04/20/20 demonstrates NSR at 87 bpm  Recent Labs: 03/11/2020: ALT 29; BUN 19; Creatinine, Ser 0.66; Hemoglobin 12.2; Platelets 227; Potassium 4.1; Sodium 142; TSH 1.030  Recent Lipid Panel    Component Value Date/Time   CHOL 144 03/11/2020 1442   TRIG 104 03/11/2020 1442   HDL 62 03/11/2020 1442   CHOLHDL 2.3 03/11/2020 1442   CHOLHDL 2.8 11/23/2009 0435   VLDL 15 11/23/2009 0435   LDLCALC 63 03/11/2020 1442    Physical Exam:    VS:  BP 140/84   Pulse 88   Ht 5' 2.5" (1.588 m)   Wt 156 lb 3.2 oz (70.9 kg)   BMI 28.11 kg/m     Wt Readings from Last 3 Encounters:  07/21/20 156 lb 3.2 oz (70.9 kg)  06/05/20 154 lb 3.2 oz (69.9 kg)  04/29/20 155 lb (70.3 kg)    GEN:  Well nourished, well developed in no acute distress HEENT: Normal, moist mucous membranes NECK: No JVD CARDIAC: regular rhythm, normal S1 and S2, no rubs or gallops. No  murmur. VASCULAR: Radial and DP pulses 2+ bilaterally. No carotid bruits RESPIRATORY:  Clear to auscultation without rales, wheezing or rhonchi  ABDOMEN: Soft, non-tender, non-distended MUSCULOSKELETAL:  Ambulates independently with cane SKIN: Warm and dry, no edema NEUROLOGIC:  Alert and oriented x 3. No focal neuro deficits noted. PSYCHIATRIC:  Normal affect   ASSESSMENT:    1. History of CVA (cerebrovascular accident)   2. Aortic atherosclerosis ([place])   3. Family history of heart disease   4. Coronary artery calcification seen on CT scan   5. Essential hypertension   6. Hyperlipidemia, unspecified hyperlipidemia type    PLAN:    Dyspnea on exertion: concern for anginal equivalent -lexiscan myoview 04/29/20 normal, reviewed together today -risk factors include family history of CV disease, former tobacco use, hypertension, hyperlipidemia, and history of CVA  Hypertension -continue telmisartan  Hyperlipidemia -continue rosuvastatin  History of CVA Coronary calcium on CT: -secondary prevention, rosuvastatin as above -continue aspirin, clopidogrel per neurology  Cardiac risk counseling and prevention recommendations: -recommend heart healthy/Mediterranean diet, with whole grains, fruits, vegetable, fish, lean meats, nuts, and olive oil. Limit salt. -recommend moderate walking, 3-5 times/week for 30-50 minutes each session. Aim for at least 150 minutes.week. Goal should be pace of 3 miles/hours, or walking 1.5 miles in 30 minutes -recommend avoidance of tobacco products. Avoid excess alcohol.  Plan for follow up: 1 year or sooner based on results of test  Buford Dresser, MD, PhD [place]  [facility]    Medication Adjustments/Labs and Tests Ordered: Current medicines are reviewed at length with the patient today.  Concerns regarding medicines are outlined above.  No orders of the defined types were placed in this encounter.  No orders of the defined types  were placed in this encounter.   Patient Instructions  Medication Instructions:  Your Physician recommend you continue on your current medication as directed.    *If you need a refill on your cardiac medications before your next appointment, please call your pharmacy*   Lab Work: None   Testing/Procedures: None   Follow-Up: At [facility], you and your health needs are our priority.  As part of our continuing mission to provide you with exceptional heart care, we have created designated Provider Care Teams.  These Care Teams include your primary Cardiologist (physician) and Advanced Practice Providers (APPs -  Physician Assistants and Nurse Practitioners) who all work together to provide you with the care you need, when you need it.  We recommend signing up for the patient portal called "MyChart".  Sign up information is provided on this After Visit Summary.  MyChart is used to connect with patients for Virtual Visits (Telemedicine).  Patients are able to view lab/test results, encounter notes, upcoming appointments, etc.  Non-urgent messages can be sent to your provider as well.   To learn more about what you can do with MyChart, go to [email or web address].    Your next appointment:   1 year(s)  The format for your next appointment:   In Person  Provider:   Buford Dresser, MD       Signed, Buford Dresser, MD PhD 07/21/2020  [place]

## 2020-07-21 NOTE — Patient Instructions (Signed)

## 2020-07-28 ENCOUNTER — Encounter: Payer: Self-pay | Admitting: Physical Medicine & Rehabilitation

## 2020-07-28 ENCOUNTER — Encounter: Payer: Medicare Other | Attending: Physical Medicine & Rehabilitation | Admitting: Physical Medicine & Rehabilitation

## 2020-07-28 ENCOUNTER — Other Ambulatory Visit: Payer: Self-pay

## 2020-07-28 VITALS — BP 124/87 | HR 108 | Temp 98.6°F | Ht 62.5 in | Wt 156.0 lb

## 2020-07-28 DIAGNOSIS — G8110 Spastic hemiplegia affecting unspecified side: Secondary | ICD-10-CM | POA: Diagnosis not present

## 2020-07-28 NOTE — Progress Notes (Signed)
  Botox Injection for spasticity using needle EMG guidance  Dilution: 50 Units/ml Indication: Severe spasticity which interferes with ADL,mobility and/or  hygiene and is unresponsive to medication management and other conservative care Informed consent was obtained after describing risks and benefits of the procedure with the patient. This includes bleeding, bruising, infection, excessive weakness, or medication side effects. A REMS form is on file and signed. Needle: 25 g 2"  needle electrode Number of units per muscle Right posterior tibialis 75 units Right FDL 75 units Right FHL 50 units All injections were done after obtaining appropriate EMG activity and after negative drawback for blood. The patient tolerated the procedure well. Post procedure instructions were given. A followup appointment was made.

## 2020-07-28 NOTE — Patient Instructions (Signed)

## 2020-08-03 ENCOUNTER — Other Ambulatory Visit: Payer: Self-pay

## 2020-08-03 MED ORDER — DICLOFENAC SODIUM 1 % EX GEL: CUTANEOUS | 2 refills | Status: AC

## 2020-08-13 ENCOUNTER — Other Ambulatory Visit: Payer: Self-pay | Admitting: Internal Medicine

## 2020-08-18 DIAGNOSIS — H353211 Exudative age-related macular degeneration, right eye, with active choroidal neovascularization: Secondary | ICD-10-CM | POA: Diagnosis not present

## 2020-08-18 DIAGNOSIS — H353122 Nonexudative age-related macular degeneration, left eye, intermediate dry stage: Secondary | ICD-10-CM | POA: Diagnosis not present

## 2020-09-08 ENCOUNTER — Other Ambulatory Visit: Payer: Self-pay

## 2020-09-08 ENCOUNTER — Encounter: Payer: Self-pay | Admitting: Physical Medicine & Rehabilitation

## 2020-09-08 ENCOUNTER — Encounter: Payer: Medicare Other | Attending: Physical Medicine & Rehabilitation | Admitting: Physical Medicine & Rehabilitation

## 2020-09-08 VITALS — BP 117/83 | HR 92 | Temp 97.8°F | Ht 62.5 in | Wt 158.2 lb

## 2020-09-08 DIAGNOSIS — G8110 Spastic hemiplegia affecting unspecified side: Secondary | ICD-10-CM | POA: Diagnosis not present

## 2020-09-08 NOTE — Patient Instructions (Signed)
Will try Right tibial nerve block with phenol

## 2020-09-08 NOTE — Progress Notes (Signed)
Subjective:    Patient ID: [name], female    DOB: [birth year], 65 y.o.   MRN: [id number]  HPI  65 year old female with history of aneurysmal bleed associated with hypertension in 2011 resulting in chronic right spastic hemiparesis affecting lower extremity somewhat more than the upper extremity. She has been treated for deficits related to this CVA and was initially seen in a skilled nursing facility but then was able to get back to a modified independent level and currently lives at home with her husband.  She is able to dress and bathe herself she has some difficulty with tying her shoes but has learned how to do this as well.  She can get in and out of her shower independently but requires assistance getting in and out of the bathtub. She falls occasionally related to turning her head to quickly.  She has gone through vestibular rehabilitation for this.  Fortunately this is a very rare occasion. The patient has had botulinum toxin injection for foot inversion spasticity as well as toe flexor spasticity.  She has had the tibialis posterior as well as EHL and FDL injected on the right side.  She has not noted any significant improvement of these abnormal movements. She is here to discuss other treatment options. Pain Inventory Average Pain 7 Pain Right Now 5 My pain is constant, tingling and aching  In the last 24 hours, has pain interfered with the following? General activity 6 Relation with others 6 Enjoyment of life 7 What TIME of day is your pain at its worst? evening and night Sleep (in general) Fair  Pain is worse with: walking, inactivity and standing Pain improves with: none Relief from Meds: 6  Family History  Problem Relation Age of Onset  . Heart disease Mother   . Heart disease Father   . Heart attack Father   . Cancer Father        prostate to lymph node  . Lung cancer Other   . Breast cancer Neg Hx    Social History   Socioeconomic History  .  Marital status: Married    Spouse name: Jori Moll  . Number of children: 2  . Years of education: 26  . Highest education level: Not on file  Occupational History  . 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 Smoking status: Former Smoker    Packs/day: 2.00    Years: 30.00    Pack years: 60.00    Types: Cigarettes    Quit date: 11/27/2009    Years since quitting: 10.7  . Smokeless tobacco: Never Used  . Tobacco comment: smoked 2.5 ppd x 5 years, 1ppd x 20+ years  Vaping Use  . Vaping Use: Never used  Substance and Sexual Activity  . Alcohol use: No    Alcohol/week: 0.0 standard drinks    Comment: patient quit alcohol use 2005.   . Drug use: No  . Sexual activity: Not Currently  Other Topics Concern  . Not on file  Social History Narrative   Patient lives at home with her husband Jori Moll).   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    Patient is on disability.    Patient has an Associates degree.   Patient is right-handed.   Patient drinks 2 cups of green tea most days.               Social  Determinants of Health   Financial Resource Strain: Low Risk   . Difficulty of Paying Living Expenses: Not hard at all  Food Insecurity: No Food Insecurity  . Worried About Charity fundraiser in the Last Year: Never true  . Ran Out of Food in the Last Year: Never true  Transportation Needs: No Transportation Needs  . Lack of Transportation (Medical): No  . Lack of Transportation (Non-Medical): No  Physical Activity: Inactive  . Days of Exercise per Week: 0 days  . Minutes of Exercise per Session: 0 min  Stress: No Stress Concern Present  . Feeling of Stress : Only a little  Social Connections: Not on file   Past Surgical History:  Procedure Laterality Date  . BACK SURGERY    . CEREBRAL ANGIOGRAM     x3  . COLONOSCOPY WITH PROPOFOL N/A 08/18/2016   Procedure: COLONOSCOPY WITH PROPOFOL;  Surgeon:  Carol Ada, MD;  Location: [facility];  Service: Endoscopy;  Laterality: N/A;  . LAPAROSCOPIC HYSTERECTOMY    . MANDIBLE FRACTURE SURGERY    . RADIOLOGY WITH ANESTHESIA N/A 08/21/2013   Procedure: RADIOLOGY WITH ANESTHESIA;  Surgeon: Rob Hickman, MD;  Location: [facility];  Service: Radiology;  Laterality: N/A;  . stones     no surgery   Past Surgical History:  Procedure Laterality Date  . BACK SURGERY    . CEREBRAL ANGIOGRAM     x3  . COLONOSCOPY WITH PROPOFOL N/A 08/18/2016   Procedure: COLONOSCOPY WITH PROPOFOL;  Surgeon: Carol Ada, MD;  Location: [facility];  Service: Endoscopy;  Laterality: N/A;  . LAPAROSCOPIC HYSTERECTOMY    . MANDIBLE FRACTURE SURGERY    . RADIOLOGY WITH ANESTHESIA N/A 08/21/2013   Procedure: RADIOLOGY WITH ANESTHESIA;  Surgeon: Rob Hickman, MD;  Location: [facility];  Service: Radiology;  Laterality: N/A;  . stones     no surgery   Past Medical History:  Diagnosis Date  . Abnormal gait   . Amnesia   . Anemia    hx  . Aneurysm ([place])   . Anxiety   . Asthma   . Atrophic vaginitis   . Benign hypertensive heart disease   . Cardiomegaly   . Chest pain   . COPD (chronic obstructive pulmonary disease) ([place])     - PFTs  01/24/06 FEV1 69% ratio 68% diffusing capacity 57% with no improvement after B2   - PFT's 10/1/ 09 FEV1 77   ratio  76    dlco                   45            no resp to B2    - Nl alpha one antitripsin level 10/09  . Cough   . Depression   . Difficulty speaking   . Disorder of nasal cavity   . Dyspnea   . Enthesopathy of elbow region   . GERD (gastroesophageal reflux disease)   . Hammer toe   . Headache(784.0)   . Hemiparesis affecting dominant side as late effect of cerebrovascular accident ([place])   . History of kidney stones   . Hyperlipidemia   . Hypertension   . Hypothyroidism   . Increased urinary frequency   . Low compliance bladder   . Macular degeneration of both eyes   . Malaise and fatigue   . Nonruptured  cerebral aneurysm   . Obesity   . Pneumonia    hx  . Pure hypercholesterolemia   .  Recurrent major depressive episodes (HCC)   . Seizure disorder, secondary (HCC)    2-3 focal siezures monthly  . Seizures (HCC)   . Short-term memory loss    since stroke  . Skin sensation disturbance   . Stroke [facility]) 2011   rt sided weakness  . Swelling of limb   . TMJ (temporomandibular joint disorder)   . Urinary tract infectious disease   . Vertigo as late effect of stroke   . Vitamin D deficiency    BP 117/83   Pulse 92   Temp 97.8 F (36.6 C)   Ht 5' 2.5" (1.588 m)   Wt 158 lb 3.2 oz (71.8 kg)   SpO2 96%   BMI 28.47 kg/m   Opioid Risk Score:   Fall Risk Score:  `1  Depression screen PHQ 2/9  Depression screen [facility] 2/9 06/05/2020 04/09/2020 03/11/2020 09/11/2019 06/11/2019 09/03/2018  Decreased Interest 0 0 0 0 0 0  Down, Depressed, Hopeless 0 1 0 0 0 0  PHQ - 2 Score 0 1 0 0 0 0  Altered sleeping - 1 3 3  - -  Tired, decreased energy - 1 2 3  - -  Change in appetite - 0 0 0 - -  Feeling bad or failure about yourself  - 0 0 0 - -  Trouble concentrating - 1 3 0 - -  Moving slowly or fidgety/restless - 0 0 0 - -  Suicidal thoughts - 0 0 0 - -  PHQ-9 Score - 4 8 6  - -  Difficult doing work/chores - - Not difficult at all Not difficult at all - -  Some recent data might be hidden    Review of Systems  Musculoskeletal:       Foot pain  All other systems reviewed and are negative.      Objective:   Physical Exam Vitals and nursing note reviewed.  Constitutional:      General: She is not in acute distress. HENT:     Head: Normocephalic and atraumatic.  Eyes:     Extraocular Movements: Extraocular movements intact.     Conjunctiva/sclera: Conjunctivae normal.     Pupils: Pupils are equal, round, and reactive to light.  Musculoskeletal:     Comments: Plantar flexion contracture right ankle worsened with knee extension.  She is -5 from neutral with knee extended and she gets to 5  degrees of dorsiflexion with knee  Neurological:     Mental Status: She is alert and oriented to person, place, and time.     Comments: Motor strength is 4/5 in the right deltoid bicep tricep grip hip flexor knee extensor 3 - ankle dorsiflexor and plantar flexor Tone is increased in the right foot inverters MAS 2 toe flexors have MA S1 spasticity With standing there is toe curling on the right foot as well as increased foot inversion  Patient ambulates with a cane she is able to ambulate without AFO she has decreased heel strike and tends to be in a supinated position at her foot and ankle  Psychiatric:        Mood and Affect: Mood normal.        Behavior: Behavior normal.   Sensation right foot is diminished to light touch compared to the left side but is able to sense light touch and correctly identify        Assessment & Plan:  #1.  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  We discussed other treatment options including phenol injection of the right tibial nerve.  We discussed the pros and cons including increased duration of response as well as potential for sensory dysesthesias in the right foot.  She states she already has paresthesias in her foot and would like to proceed with this.  We will schedule back in approximately 3 weeks.

## 2020-09-15 ENCOUNTER — Ambulatory Visit: Payer: Medicare Other | Admitting: Internal Medicine

## 2020-09-16 ENCOUNTER — Ambulatory Visit (INDEPENDENT_AMBULATORY_CARE_PROVIDER_SITE_OTHER): Payer: Medicare Other | Admitting: Internal Medicine

## 2020-09-16 ENCOUNTER — Other Ambulatory Visit: Payer: Self-pay

## 2020-09-16 ENCOUNTER — Encounter: Payer: Self-pay | Admitting: Internal Medicine

## 2020-09-16 ENCOUNTER — Ambulatory Visit (INDEPENDENT_AMBULATORY_CARE_PROVIDER_SITE_OTHER): Payer: Medicare Other

## 2020-09-16 VITALS — BP 128/78 | HR 104 | Temp 97.8°F | Ht 62.0 in | Wt 162.2 lb

## 2020-09-16 DIAGNOSIS — Z0000 Encounter for general adult medical examination without abnormal findings: Secondary | ICD-10-CM | POA: Diagnosis not present

## 2020-09-16 DIAGNOSIS — F331 Major depressive disorder, recurrent, moderate: Secondary | ICD-10-CM

## 2020-09-16 DIAGNOSIS — I2510 Atherosclerotic heart disease of native coronary artery without angina pectoris: Secondary | ICD-10-CM

## 2020-09-16 DIAGNOSIS — R7309 Other abnormal glucose: Secondary | ICD-10-CM

## 2020-09-16 DIAGNOSIS — I10 Essential (primary) hypertension: Secondary | ICD-10-CM | POA: Diagnosis not present

## 2020-09-16 DIAGNOSIS — L659 Nonscarring hair loss, unspecified: Secondary | ICD-10-CM | POA: Diagnosis not present

## 2020-09-16 DIAGNOSIS — Z87891 Personal history of nicotine dependence: Secondary | ICD-10-CM

## 2020-09-16 DIAGNOSIS — J432 Centrilobular emphysema: Secondary | ICD-10-CM

## 2020-09-16 DIAGNOSIS — I119 Hypertensive heart disease without heart failure: Secondary | ICD-10-CM

## 2020-09-16 DIAGNOSIS — E039 Hypothyroidism, unspecified: Secondary | ICD-10-CM

## 2020-09-16 DIAGNOSIS — J449 Chronic obstructive pulmonary disease, unspecified: Secondary | ICD-10-CM

## 2020-09-16 DIAGNOSIS — H353211 Exudative age-related macular degeneration, right eye, with active choroidal neovascularization: Secondary | ICD-10-CM

## 2020-09-16 DIAGNOSIS — I700 Atherosclerosis of aorta: Secondary | ICD-10-CM | POA: Diagnosis not present

## 2020-09-16 DIAGNOSIS — G40909 Epilepsy, unspecified, not intractable, without status epilepticus: Secondary | ICD-10-CM

## 2020-09-16 LAB — POCT URINALYSIS DIPSTICK
Bilirubin, UA: NEGATIVE
Glucose, UA: NEGATIVE
Ketones, UA: NEGATIVE
Nitrite, UA: NEGATIVE
Protein, UA: NEGATIVE
Spec Grav, UA: 1.025 (ref 1.010–1.025)
Urobilinogen, UA: 0.2 E.U./dL
pH, UA: 7 (ref 5.0–8.0)

## 2020-09-16 LAB — POCT UA - MICROALBUMIN
Albumin/Creatinine Ratio, Urine, POC: <30
Creatinine, POC: 300 mg/dL
Microalbumin Ur, POC: 30 mg/L

## 2020-09-16 NOTE — Progress Notes (Signed)
I,Katawbba Wiggins,acting as a Education administrator for [name], MD.,have documented all relevant documentation on the behalf of [name], MD,as directed by  [name], MD while in the presence of [name], MD.  This visit occurred during the SARS-CoV-2 public health emergency.  Safety protocols were in place, including screening questions prior to the visit, additional usage of staff PPE, and extensive cleaning of exam room while observing appropriate contact time as indicated for disinfecting solutions.  Subjective:     Patient ID: [name] , female    DOB: [birth year] , 65 y.o.   MRN: [id number]   Chief Complaint  Patient presents with  . Hypertension    HPI  The patient is here today for a blood pressure follow-up.  She reports compliance with meds. She denies headaches, chest pain and palpitations. She reports she has a lot to discuss today.  Hypertension This is a chronic problem. The current episode started more than 1 year ago. The problem has been gradually improving since onset. The problem is controlled. Pertinent negatives include no blurred vision, chest pain, headaches, palpitations or shortness of breath. Risk factors for coronary artery disease include post-menopausal state and sedentary lifestyle. The current treatment provides moderate improvement. Compliance problems include exercise.      Past Medical History:  Diagnosis Date  . Abnormal gait   . Amnesia   . Anemia    hx  . Aneurysm ([place])   . Anxiety   . Asthma   . Atrophic vaginitis   . Benign hypertensive heart disease   . Cardiomegaly   . Chest pain   . COPD (chronic obstructive pulmonary disease) ([place])     - PFTs  01/24/06 FEV1 69% ratio 68% diffusing capacity 57% with no improvement after B2   - PFT's 10/1/ 09 FEV1 77   ratio  76    dlco                   45            no resp to B2    - Nl alpha one antitripsin level 10/09  . Cough   . Depression   . Difficulty speaking   .  Disorder of nasal cavity   . Dyspnea   . Enthesopathy of elbow region   . GERD (gastroesophageal reflux disease)   . Hammer toe   . Headache(784.0)   . Hemiparesis affecting dominant side as late effect of cerebrovascular accident ([place])   . History of kidney stones   . Hyperlipidemia   . Hypertension   . Hypothyroidism   . Increased urinary frequency   . Low compliance bladder   . Macular degeneration of both eyes   . Malaise and fatigue   . Nonruptured cerebral aneurysm   . Obesity   . Pneumonia    hx  . Pure hypercholesterolemia   . Recurrent major depressive episodes ([place])   . Seizure disorder, secondary ([place])    2-3 focal siezures monthly  . Seizures ([place])   . Short-term memory loss    since stroke  . Skin sensation disturbance   . Stroke [facility]) 2011   rt sided weakness  . Swelling of limb   . TMJ (temporomandibular joint disorder)   . Urinary tract infectious disease   . Vertigo as late effect of stroke   . Vitamin D deficiency      Family History  Problem Relation Age of Onset  . Heart  disease Mother   . Heart disease Father   . Heart attack Father   . Cancer Father        prostate to lymph node  . Lung cancer Other   . Breast cancer Neg Hx      Current Outpatient Medications:  .  albuterol (PROVENTIL) (2.5 MG/3ML) 0.083% nebulizer solution, Take 3 mLs (2.5 mg total) by nebulization every 6 (six) hours., Disp: 1080 mL, Rfl: 1 .  albuterol (VENTOLIN HFA) 108 (90 Base) MCG/ACT inhaler, Inhale 2 puffs into the lungs every 6 (six) hours as needed for wheezing or shortness of breath., Disp: 24 g, Rfl: 1 .  ALPRAZolam (XANAX) 0.25 MG tablet, TAKE 1 TABLET TWICE A DAY AS NEEDED, Disp: 60 tablet, Rfl: 2 .  aspirin EC 81 MG tablet, Take 81 mg by mouth daily., Disp: , Rfl:  .  Biotin 1 MG CAPS, Take by mouth., Disp: , Rfl:  .  Cholecalciferol (VITAMIN D-3) 1000 units CAPS, Take 1,000 Units by mouth daily., Disp: , Rfl:  .  citalopram (CELEXA) 40 MG tablet, Take 1  tablet (40 mg total) by mouth at bedtime., Disp: 90 tablet, Rfl: 1 .  clopidogrel (PLAVIX) 75 MG tablet, TAKE 1 TABLET DAILY, Disp: 90 tablet, Rfl: 3 .  CRANBERRY PO, Take 2 tablets by mouth 2 (two) times daily., Disp: , Rfl:  .  cyclobenzaprine (FLEXERIL) 10 MG tablet, TAKE 1 TABLET AT BEDTIME AS NEEDED, Disp: 60 tablet, Rfl: 5 .  diclofenac Sodium (VOLTAREN) 1 % GEL, Voltaren 1 % topical gel  APPLY 2 GRAM TO THE AFFECTED AREA(S) BY TOPICAL ROUTE 4 TIMES PER DAY, Disp: 350 g, Rfl: 2 .  fluticasone (FLONASE) 50 MCG/ACT nasal spray, Place 2 sprays into both nostrils daily as needed (allergies or post-nasl drip). , Disp: , Rfl: 3 .  levETIRAcetam (KEPPRA) 500 MG tablet, Take 1 tablet (500 mg total) by mouth every 12 (twelve) hours., Disp: 180 tablet, Rfl: 3 .  levothyroxine (SYNTHROID) 50 MCG tablet, TAKE 1 TABLET ON MONDAY THROUGH WEDNESDAY, Disp: 39 tablet, Rfl: 3 .  levothyroxine (SYNTHROID) 75 MCG tablet, TAKE 1 TABLET IN THE MORNING BEFORE BREAKFAST ON THURSDAY THROUGH SUNDAY (TAKE 50 MCG ON OTHER DAYS), Disp: 51 tablet, Rfl: 3 .  Multiple Vitamins-Minerals (PRESERVISION/LUTEIN PO), Take by mouth. , Disp: , Rfl:  .  NON FORMULARY, preser vision 2 tabs daily, Disp: , Rfl:  .  NONFORMULARY OR COMPOUNDED ITEM, Apply 240 mg topically daily as needed., Disp: 1 each, Rfl: 3 .  Omega-3 Fatty Acids (FISH OIL) 1000 MG CAPS, Take 2,000 mg by mouth 2 (two) times daily. , Disp: , Rfl:  .  rosuvastatin (CRESTOR) 20 MG tablet, Take 1 tablet (20 mg total) by mouth daily., Disp: 90 tablet, Rfl: 3 .  telmisartan (MICARDIS) 20 MG tablet, TAKE 1 TABLET DAILY, Disp: 90 tablet, Rfl: 3 .  vitamin C (ASCORBIC ACID) 500 MG tablet, Take 500 mg by mouth daily., Disp: , Rfl:  .  vitamin E 1000 UNIT capsule, Take 1,000 Units by mouth at bedtime., Disp: , Rfl:     Allergies  Allergen Reactions  . Phenytoin Anaphylaxis, Hives and Swelling     Review of Systems  Constitutional: Negative.   Eyes: Negative for blurred  vision.  Respiratory: Negative.  Negative for shortness of breath.   Cardiovascular: Negative for chest pain and palpitations.  Gastrointestinal: Negative.   Skin:       She c/o hair loss. She states a lot of hair  comes out when she washes and brushes her hair. Denies new meds, new diet. Not sure what could be triggering her sx.   Neurological: Negative for headaches.  Psychiatric/Behavioral: Negative.   All other systems reviewed and are negative.    Today's Vitals   09/16/20 0937  BP: 128/78  Pulse: (!) 104  Temp: 97.8 F (36.6 C)  TempSrc: Oral  Weight: 162 lb 3.2 oz (73.6 kg)  Height: 5' 2" (1.575 m)   Body mass index is 29.67 kg/m.  Wt Readings from Last 3 Encounters:  10/29/20 160 lb (72.6 kg)  09/29/20 160 lb 6.4 oz (72.8 kg)  09/29/20 161 lb 6.4 oz (73.2 kg)   Objective:  Physical Exam Vitals and nursing note reviewed.  Constitutional:      Appearance: Normal appearance. She is obese.  HENT:     Head: Normocephalic and atraumatic.     Nose:     Comments: Masked     Mouth/Throat:     Comments: Masked  Cardiovascular:     Rate and Rhythm: Normal rate and regular rhythm.     Heart sounds: Normal heart sounds.  Pulmonary:     Breath sounds: Normal breath sounds.  Musculoskeletal:     Comments: Ambulatory with cane  Skin:    General: Skin is warm.  Neurological:     General: No focal deficit present.     Mental Status: She is alert and oriented to person, place, and time.         Assessment And Plan:     1. Hypertensive heart disease without heart failure Comments: Chronic, well controlled. She is encouraged to c/w low sodium diet.  She will c/w telmisartan daily.  - CMP14+EGFR  2. Atherosclerosis of native coronary artery of native heart, unspecified whether angina present Comments: Chronic. Encouraged to keep regular f/u with Cardiology. Follow heart healthy diet, encouraged to follow Mediterranean diet.   3. Hair loss Comments: Chronic, I will  check labs as listed below. I will also refer her to [facility] as requested.  - TSH - CMP14+EGFR - CBC no Diff - Ferritin - Ambulatory referral to Dermatology  4. Acquired hypothyroidism Comments: Chronic, I will check thyroid panel and adjust meds as needed.  - TSH - T4, free  5. Other abnormal glucose Comments: Her a1c has been elevated in the past. I will recheck this today. Encouraged to limit her intake of sugary beverages. - Hemoglobin A1c  6. Atherosclerosis of aorta (HCC) Comments: Chronic. Advised to follow heart healthy lifestyle. Importance of statin compliance was discussed with the patient.  - High sensitivity CRP - Lipid panel  7. Centrilobular emphysema (HCC) Comments: Chronic. She is no longer smoking, also followed by Pulmonary.   8. Chronic obstructive pulmonary disease, unspecified COPD type ([place]) Comments: Please see above.   9. Major depressive disorder, recurrent episode, moderate (HCC) Comments: Chronic, stable on meds. Not currently in therapy. She will c/w citalopram 56m daily.   10. History of tobacco use disorder Comments: Will order low dose CT chest.  - CT CHEST LUNG CA SCREEN LOW DOSE W/O CM; Future   Patient was given opportunity to ask questions. Patient verbalized understanding of the plan and was able to repeat key elements of the plan. All questions were answered to their satisfaction.   I, RMaximino Greenland MD, have reviewed all documentation for this visit. The documentation on 10/31/20 for the exam, diagnosis, procedures, and orders are all accurate and complete.  THE PATIENT IS ENCOURAGED  TO PRACTICE SOCIAL DISTANCING DUE TO THE COVID-19 PANDEMIC.

## 2020-09-16 NOTE — Progress Notes (Signed)
This visit occurred during the SARS-CoV-2 public health emergency.  Safety protocols were in place, including screening questions prior to the visit, additional usage of staff PPE, and extensive cleaning of exam room while observing appropriate contact time as indicated for disinfecting solutions.  Subjective:   [name] is a 65 y.o. female who presents for Medicare Annual (Subsequent) preventive examination.  Review of Systems     Cardiac Risk Factors include: hypertension;sedentary lifestyle     Objective:    Today's Vitals   09/16/20 0919  BP: 128/78  Pulse: (!) 104  Temp: 97.8 F (36.6 C)  TempSrc: Oral  SpO2: 96%  Weight: 162 lb 3.2 oz (73.6 kg)  Height: 5\' 2"  (1.575 m)   Body mass index is 29.67 kg/m.  Advanced Directives 09/16/2020 09/11/2019 09/03/2018 11/13/2017 08/18/2016 05/28/2015 01/29/2015  Does Patient Have a Medical Advance Directive? Yes Yes No No No Yes Yes  Type of Paramedic of [place];Living will [place];Living will - - - Living will [place];Living will  Does patient want to make changes to medical advance directive? - - - - - - -  Copy of [place] in Chart? No - copy requested No - copy requested - - - No - copy requested -  Would patient like information on creating a medical advance directive? - - Yes (MAU/Ambulatory/Procedural Areas - Information given) No - Patient declined No - Patient declined - -  Pre-existing out of facility DNR order (yellow form or pink MOST form) - - - - - - -    Current Medications (verified) Outpatient Encounter Medications as of 09/16/2020  Medication Sig  . albuterol (PROVENTIL) (2.5 MG/3ML) 0.083% nebulizer solution Take 3 mLs (2.5 mg total) by nebulization every 6 (six) hours.  Marland Kitchen albuterol (VENTOLIN HFA) 108 (90 Base) MCG/ACT inhaler Inhale 2 puffs into the lungs every 6 (six) hours as needed for wheezing or shortness of breath.   . ALPRAZolam (XANAX) 0.25 MG tablet TAKE 1 TABLET TWICE A DAY AS NEEDED  . aspirin EC 81 MG tablet Take 81 mg by mouth daily.  . Biotin 1 MG CAPS Take by mouth.  . Cholecalciferol (VITAMIN D-3) 1000 units CAPS Take 1,000 Units by mouth daily.  . citalopram (CELEXA) 40 MG tablet Take 1 tablet (40 mg total) by mouth at bedtime.  . clopidogrel (PLAVIX) 75 MG tablet TAKE 1 TABLET DAILY  . CRANBERRY PO Take 2 tablets by mouth 2 (two) times daily.  . cyclobenzaprine (FLEXERIL) 10 MG tablet TAKE 1 TABLET AT BEDTIME AS NEEDED  . diclofenac Sodium (VOLTAREN) 1 % GEL Voltaren 1 % topical gel  APPLY 2 GRAM TO THE AFFECTED AREA(S) BY TOPICAL ROUTE 4 TIMES PER DAY  . fluticasone (FLONASE) 50 MCG/ACT nasal spray Place 2 sprays into both nostrils daily as needed (allergies or post-nasl drip).   Marland Kitchen levETIRAcetam (KEPPRA) 500 MG tablet Take 1 tablet (500 mg total) by mouth every 12 (twelve) hours.  Marland Kitchen levothyroxine (SYNTHROID) 50 MCG tablet TAKE 1 TABLET ON MONDAY THROUGH WEDNESDAY  . levothyroxine (SYNTHROID) 75 MCG tablet TAKE 1 TABLET IN THE MORNING BEFORE BREAKFAST ON THURSDAY THROUGH SUNDAY (TAKE 50 MCG ON OTHER DAYS)  . losartan (COZAAR) 100 MG tablet Take 100 mg by mouth every morning.  . Multiple Vitamins-Minerals (PRESERVISION/LUTEIN PO) Take by mouth.   . NON FORMULARY preser vision 2 tabs daily  . NONFORMULARY OR COMPOUNDED ITEM Apply 240 mg topically daily as needed.  Marland Kitchen  Omega-3 Fatty Acids (FISH OIL) 1000 MG CAPS Take 2,000 mg by mouth 2 (two) times daily.   . rosuvastatin (CRESTOR) 10 MG tablet TAKE 1 TABLET DAILY  . telmisartan (MICARDIS) 20 MG tablet TAKE 1 TABLET DAILY  . vitamin C (ASCORBIC ACID) 500 MG tablet Take 500 mg by mouth daily.  . vitamin E 1000 UNIT capsule Take 1,000 Units by mouth at bedtime.  . topiramate (TOPAMAX) 25 MG tablet Take 1 tablet (25 mg total) by mouth 2 (two) times daily. (Patient not taking: Reported on 09/16/2020)   No facility-administered encounter medications  on file as of 09/16/2020.    Allergies (verified) Phenytoin   History: Past Medical History:  Diagnosis Date  . Abnormal gait   . Amnesia   . Anemia    hx  . Aneurysm ([place])   . Anxiety   . Asthma   . Atrophic vaginitis   . Benign hypertensive heart disease   . Cardiomegaly   . Chest pain   . COPD (chronic obstructive pulmonary disease) ([place])     - PFTs  01/24/06 FEV1 69% ratio 68% diffusing capacity 57% with no improvement after B2   - PFT's 10/1/ 09 FEV1 77   ratio  76    dlco                   45            no resp to B2    - Nl alpha one antitripsin level 10/09  . Cough   . Depression   . Difficulty speaking   . Disorder of nasal cavity   . Dyspnea   . Enthesopathy of elbow region   . GERD (gastroesophageal reflux disease)   . Hammer toe   . Headache(784.0)   . Hemiparesis affecting dominant side as late effect of cerebrovascular accident ([place])   . History of kidney stones   . Hyperlipidemia   . Hypertension   . Hypothyroidism   . Increased urinary frequency   . Low compliance bladder   . Macular degeneration of both eyes   . Malaise and fatigue   . Nonruptured cerebral aneurysm   . Obesity   . Pneumonia    hx  . Pure hypercholesterolemia   . Recurrent major depressive episodes ([place])   . Seizure disorder, secondary ([place])    2-3 focal siezures monthly  . Seizures ([place])   . Short-term memory loss    since stroke  . Skin sensation disturbance   . Stroke [facility]) 2011   rt sided weakness  . Swelling of limb   . TMJ (temporomandibular joint disorder)   . Urinary tract infectious disease   . Vertigo as late effect of stroke   . Vitamin D deficiency    Past Surgical History:  Procedure Laterality Date  . BACK SURGERY    . CEREBRAL ANGIOGRAM     x3  . COLONOSCOPY WITH PROPOFOL N/A 08/18/2016   Procedure: COLONOSCOPY WITH PROPOFOL;  Surgeon: Carol Ada, MD;  Location: [facility];  Service: Endoscopy;  Laterality: N/A;  . LAPAROSCOPIC HYSTERECTOMY    .  MANDIBLE FRACTURE SURGERY    . RADIOLOGY WITH ANESTHESIA N/A 08/21/2013   Procedure: RADIOLOGY WITH ANESTHESIA;  Surgeon: Rob Hickman, MD;  Location: [facility];  Service: Radiology;  Laterality: N/A;  . stones     no surgery   Family History  Problem Relation Age of Onset  . Heart disease Mother   . Heart disease Father   .  Heart attack Father   . Cancer Father        prostate to lymph node  . Lung cancer Other   . Breast cancer Neg Hx    Social History   Socioeconomic History  . Marital status: Married    Spouse name: Jori Moll  . Number of children: 2  . Years of education: 67  . Highest education level: Not on file  Occupational History  . 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 Smoking status: Former Smoker    Packs/day: 2.00    Years: 30.00    Pack years: 60.00    Types: Cigarettes    Quit date: 11/27/2009    Years since quitting: 10.8  . Smokeless tobacco: Never Used  . Tobacco comment: smoked 2.5 ppd x 5 years, 1ppd x 20+ years  Vaping Use  . Vaping Use: Never used  Substance and Sexual Activity  . Alcohol use: No    Alcohol/week: 0.0 standard drinks    Comment: patient quit alcohol use 2005.   . Drug use: No  . Sexual activity: Not Currently  Other Topics Concern  . Not on file  Social History Narrative   Patient lives at home with her husband Jori Moll).   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    Patient is on disability.    Patient has an Associates degree.   Patient is right-handed.   Patient drinks 2 cups of green tea most days.               Social Determinants of Health   Financial Resource Strain: Low Risk   . Difficulty of Paying Living Expenses: Not hard at all  Food Insecurity: No Food Insecurity  . Worried About Charity fundraiser in the Last Year: Never true  . Ran Out of Food in the Last Year: Never true  Transportation Needs: No Transportation  Needs  . Lack of Transportation (Medical): No  . Lack of Transportation (Non-Medical): No  Physical Activity: Inactive  . Days of Exercise per Week: 0 days  . Minutes of Exercise per Session: 0 min  Stress: No Stress Concern Present  . Feeling of Stress : Not at all  Social Connections: Not on file    Tobacco Counseling Counseling given: Not Answered Comment: smoked 2.5 ppd x 5 years, 1ppd x 20+ years   Clinical Intake:  Pre-visit preparation completed: Yes  Pain : No/denies pain     Nutritional Status: BMI 25 -29 Overweight Nutritional Risks: None  How often do you need to have someone help you when you read instructions, pamphlets, or other written materials from your doctor or pharmacy?: 1 - Never What is the last grade level you completed in school?: associate's degree  Diabetic? no  Interpreter Needed?: No  Information entered by :: NAllen LPN   Activities of Daily Living In your present state of health, do you have any difficulty performing the following activities: 09/16/2020  Hearing? N  Vision? Y  Difficulty concentrating or making decisions? Y  Walking or climbing stairs? Y  Dressing or bathing? N  Doing errands, shopping? Y  Comment someone Restaurant manager, fast food and eating ? N  Using the Toilet? N  In the past six months, have you accidently leaked urine? Y  Do you have problems with loss of bowel control? N  Managing your Medications? N  Managing your  Finances? N  Housekeeping or managing your Housekeeping? N  Some recent data might be hidden    Patient Care Team: Glendale Chard, MD as PCP - General (Internal Medicine) Buford Dresser, MD as PCP - Cardiology (Cardiology) Rex Kras, Claudette Stapler, RN as [place] any recent Medical Services you may have received from other than [facility] providers in the past year (date may be approximate).     Assessment:   This is a routine wellness examination for  [name].  Hearing/Vision screen No exam data present  Dietary issues and exercise activities discussed: Current Exercise Habits: The patient does not participate in regular exercise at present  Goals    . Exercise 150 min/wk Moderate Activity     "I would like to exercise more and plan on it when we start back at the church"    . Exercise 150 min/wk Moderate Activity     09/11/2019, wants to exercise at home and eat healthier    . Patient Stated     09/16/2020, wants to weigh 140 pounds      Depression Screen PHQ 2/9 Scores 09/16/2020 06/05/2020 04/09/2020 03/11/2020 09/11/2019 06/11/2019 09/03/2018  PHQ - 2 Score 0 0 1 0 0 0 0  PHQ- 9 Score 10 - 4 8 6  - -    Fall Risk Fall Risk  09/16/2020 07/28/2020 06/05/2020 04/09/2020 09/11/2019  Falls in the past year? 1 1 0 0 1  Comment loses balance - - - loses balance or turns too quick  Number falls in past yr: 1 1 0 1 1  Injury with Fall? 1 1 0 0 1  Comment black eye, swollen hand head injury - - black eyes, knot on head  Risk Factor Category  - - - - -  Risk for fall due to : History of fall(s);Impaired balance/gait;Impaired mobility;Medication side effect - - - Impaired balance/gait;History of fall(s);Impaired mobility  Follow up Falls evaluation completed;Education provided;Falls prevention discussed - - - Falls evaluation completed;Education provided;Falls prevention discussed    FALL RISK PREVENTION PERTAINING TO THE HOME:  Any stairs in or around the home? Yes  If so, are there any without handrails? No  Home free of loose throw rugs in walkways, pet beds, electrical cords, etc? Yes  Adequate lighting in your home to reduce risk of falls? Yes   ASSISTIVE DEVICES UTILIZED TO PREVENT FALLS:  Life alert? No  Use of a cane, walker or w/c? Yes  Grab bars in the bathroom? No  Shower chair or bench in shower? Yes  Elevated toilet seat or a handicapped toilet? Yes   TIMED UP AND GO:  Was the test performed? No   Cognitive Function: MMSE  - Mini Mental State Exam 09/03/2018 11/03/2015 05/05/2015 11/05/2014  Orientation to time 4 4 5 4   Orientation to Place 5 4 4 5   Registration 3 3 3 3   Attention/ Calculation 5 5 4 5   Recall 3 2 1 2   Language- name 2 objects 2 2 2 2   Language- repeat 1 1 1 1   Language- follow 3 step command 3 3 3 3   Language- read & follow direction 1 1 1 1   Write a sentence 1 1 1 1   Copy design 1 1 1 1   Total score 29 27 26 28      6CIT Screen 09/16/2020 09/11/2019  What Year? 0 points 0 points  What month? 0 points 0 points  What time? 0 points 0 points  Count back from  20 0 points 0 points  Months in reverse 0 points 0 points  Repeat phrase 4 points 0 points  Total Score 4 0    Immunizations Immunization History  Administered Date(s) Administered  . Influenza,inj,Quad PF,6+ Mos 06/22/2017, 07/23/2018, 05/20/2019  . Influenza-Unspecified 06/20/2018, 06/17/2020  . PFIZER SARS-COV-2 Vaccination 11/22/2019, 12/13/2019, 06/17/2020  . Zoster Recombinat (Shingrix) 06/22/2017, 09/01/2017    TDAP status: Up to date  Flu Vaccine status: Up to date  Pneumococcal vaccine status: Up to date  Covid-19 vaccine status: Completed vaccines  Qualifies for Shingles Vaccine? Yes   Zostavax completed No   Shingrix Completed?: Yes  Screening Tests Health Maintenance  Topic Date Due  . PAP SMEAR-Modifier  08/31/2020  . HIV Screening  03/11/2021 (Originally 11/08/1970)  . TETANUS/TDAP  03/06/2022  . MAMMOGRAM  05/28/2022  . COLONOSCOPY (Pts 45-11yrs Insurance coverage will need to be confirmed)  08/18/2026  . INFLUENZA VACCINE  Completed  . COVID-19 Vaccine  Completed  . Hepatitis C Screening  Completed    Health Maintenance  Health Maintenance Due  Topic Date Due  . PAP SMEAR-Modifier  08/31/2020    Colorectal cancer screening: Type of screening: Colonoscopy. Completed 08/18/2016. Repeat every 10 years  Mammogram status: Completed 05/28/2020. Repeat every year  Bone Density status: Completed  05/01/2017.   Lung Cancer Screening: (Low Dose CT Chest recommended if Age 52-80 years, 30 pack-year currently smoking OR have quit w/in 15years.) does not qualify.   Lung Cancer Screening Referral: no  Additional Screening:  Hepatitis C Screening: does qualify; Completed 09/07/2012  Vision Screening: Recommended annual ophthalmology exams for early detection of glaucoma and other disorders of the eye. Is the patient up to date with their annual eye exam?  Yes  Who is the provider or what is the name of the office in which the patient attends annual eye exams? Can't remember name If pt is not established with a provider, would they like to be referred to a provider to establish care? No .   Dental Screening: Recommended annual dental exams for proper oral hygiene  Community Resource Referral / Chronic Care Management: CRR required this visit?  No   CCM required this visit?  No      Plan:     I have personally reviewed and noted the following in the patient's chart:   . Medical and social history . Use of alcohol, tobacco or illicit drugs  . Current medications and supplements . Functional ability and status . Nutritional status . Physical activity . Advanced directives . List of other physicians . Hospitalizations, surgeries, and ER visits in previous 12 months . Vitals . Screenings to include cognitive, depression, and falls . Referrals and appointments  In addition, I have reviewed and discussed with patient certain preventive protocols, quality metrics, and best practice recommendations. A written personalized care plan for preventive services as well as general preventive health recommendations were provided to patient.     Kellie Simmering, LPN   [id number]/61/0960   Nurse Notes:

## 2020-09-16 NOTE — Patient Instructions (Signed)

## 2020-09-16 NOTE — Patient Instructions (Signed)
Ms. Appenzeller , Thank you for taking time to come for your Medicare Wellness Visit. I appreciate your ongoing commitment to your health goals. Please review the following plan we discussed and let me know if I can assist you in the future.   Screening recommendations/referrals: Colonoscopy: completed 08/18/2016, due 08/18/2026 Mammogram: completed 05/28/2020, due 05/28/2021 Bone Density: completed 05/01/2018 Recommended yearly ophthalmology/optometry visit for glaucoma screening and checkup Recommended yearly dental visit for hygiene and checkup  Vaccinations: Influenza vaccine: completed 06/17/2020 Pneumococcal vaccine: n/a Tdap vaccine: completed 03/06/2012, due 03/06/2022 Shingles vaccine: discussed  Covid-19:  06/17/2020, 12/13/2019, 11/22/2019  Advanced directives: Please bring a copy of your POA (Power of Attorney) and/or Living Will to your next appointment.   Conditions/risks identified: none  Next appointment: Follow up in one year for your annual wellness visit.   Preventive Care 40-64 Years, Female Preventive care refers to lifestyle choices and visits with your health care provider that can promote health and wellness. What does preventive care include?  A yearly physical exam. This is also called an annual well check.  Dental exams once or twice a year.  Routine eye exams. Ask your health care provider how often you should have your eyes checked.  Personal lifestyle choices, including:  Daily care of your teeth and gums.  Regular physical activity.  Eating a healthy diet.  Avoiding tobacco and drug use.  Limiting alcohol use.  Practicing safe sex.  Taking low-dose aspirin daily starting at age 19.  Taking vitamin and mineral supplements as recommended by your health care provider. What happens during an annual well check? The services and screenings done by your health care provider during your annual well check will depend on your age, overall health,  lifestyle risk factors, and family history of disease. Counseling  Your health care provider may ask you questions about your:  Alcohol use.  Tobacco use.  Drug use.  Emotional well-being.  Home and relationship well-being.  Sexual activity.  Eating habits.  Work and work Statistician.  Method of birth control.  Menstrual cycle.  Pregnancy history. Screening  You may have the following tests or measurements:  Height, weight, and BMI.  Blood pressure.  Lipid and cholesterol levels. These may be checked every 5 years, or more frequently if you are over 39 years old.  Skin check.  Lung cancer screening. You may have this screening every year starting at age 79 if you have a 30-pack-year history of smoking and currently smoke or have quit within the past 15 years.  Fecal occult blood test (FOBT) of the stool. You may have this test every year starting at age 64.  Flexible sigmoidoscopy or colonoscopy. You may have a sigmoidoscopy every 5 years or a colonoscopy every 10 years starting at age 55.  Hepatitis C blood test.  Hepatitis B blood test.  Sexually transmitted disease (STD) testing.  Diabetes screening. This is done by checking your blood sugar (glucose) after you have not eaten for a while (fasting). You may have this done every 1-3 years.  Mammogram. This may be done every 1-2 years. Talk to your health care provider about when you should start having regular mammograms. This may depend on whether you have a family history of breast cancer.  BRCA-related cancer screening. This may be done if you have a family history of breast, ovarian, tubal, or peritoneal cancers.  Pelvic exam and Pap test. This may be done every 3 years starting at age 77. Starting at age 31, this  may be done every 5 years if you have a Pap test in combination with an HPV test.  Bone density scan. This is done to screen for osteoporosis. You may have this scan if you are at high risk for  osteoporosis. Discuss your test results, treatment options, and if necessary, the need for more tests with your health care provider. Vaccines  Your health care provider may recommend certain vaccines, such as:  Influenza vaccine. This is recommended every year.  Tetanus, diphtheria, and acellular pertussis (Tdap, Td) vaccine. You may need a Td booster every 10 years.  Zoster vaccine. You may need this after age 12.  Pneumococcal 13-valent conjugate (PCV13) vaccine. You may need this if you have certain conditions and were not previously vaccinated.  Pneumococcal polysaccharide (PPSV23) vaccine. You may need one or two doses if you smoke cigarettes or if you have certain conditions. Talk to your health care provider about which screenings and vaccines you need and how often you need them. This information is not intended to replace advice given to you by your health care provider. Make sure you discuss any questions you have with your health care provider. Document Released: 09/18/2015 Document Revised: 05/11/2016 Document Reviewed: 06/23/2015 Elsevier Interactive Patient Education  2017 [place] Prevention in the Home Falls can cause injuries. They can happen to people of all ages. There are many things you can do to make your home safe and to help prevent falls. What can I do on the outside of my home?  Regularly fix the edges of walkways and driveways and fix any cracks.  Remove anything that might make you trip as you walk through a door, such as a raised step or threshold.  Trim any bushes or trees on the path to your home.  Use bright outdoor lighting.  Clear any walking paths of anything that might make someone trip, such as rocks or tools.  Regularly check to see if handrails are loose or broken. Make sure that both sides of any steps have handrails.  Any raised decks and porches should have guardrails on the edges.  Have any leaves, snow, or ice cleared  regularly.  Use sand or salt on walking paths during winter.  Clean up any spills in your garage right away. This includes oil or grease spills. What can I do in the bathroom?  Use night lights.  Install grab bars by the toilet and in the tub and shower. Do not use towel bars as grab bars.  Use non-skid mats or decals in the tub or shower.  If you need to sit down in the shower, use a plastic, non-slip stool.  Keep the floor dry. Clean up any water that spills on the floor as soon as it happens.  Remove soap buildup in the tub or shower regularly.  Attach bath mats securely with double-sided non-slip rug tape.  Do not have throw rugs and other things on the floor that can make you trip. What can I do in the bedroom?  Use night lights.  Make sure that you have a light by your bed that is easy to reach.  Do not use any sheets or blankets that are too big for your bed. They should not hang down onto the floor.  Have a firm chair that has side arms. You can use this for support while you get dressed.  Do not have throw rugs and other things on the floor that can make you  trip. What can I do in the kitchen?  Clean up any spills right away.  Avoid walking on wet floors.  Keep items that you use a lot in easy-to-reach places.  If you need to reach something above you, use a strong step stool that has a grab bar.  Keep electrical cords out of the way.  Do not use floor polish or wax that makes floors slippery. If you must use wax, use non-skid floor wax.  Do not have throw rugs and other things on the floor that can make you trip. What can I do with my stairs?  Do not leave any items on the stairs.  Make sure that there are handrails on both sides of the stairs and use them. Fix handrails that are broken or loose. Make sure that handrails are as long as the stairways.  Check any carpeting to make sure that it is firmly attached to the stairs. Fix any carpet that is loose  or worn.  Avoid having throw rugs at the top or bottom of the stairs. If you do have throw rugs, attach them to the floor with carpet tape.  Make sure that you have a light switch at the top of the stairs and the bottom of the stairs. If you do not have them, ask someone to add them for you. What else can I do to help prevent falls?  Wear shoes that:  Do not have high heels.  Have rubber bottoms.  Are comfortable and fit you well.  Are closed at the toe. Do not wear sandals.  If you use a stepladder:  Make sure that it is fully opened. Do not climb a closed stepladder.  Make sure that both sides of the stepladder are locked into place.  Ask someone to hold it for you, if possible.  Clearly mark and make sure that you can see:  Any grab bars or handrails.  First and last steps.  Where the edge of each step is.  Use tools that help you move around (mobility aids) if they are needed. These include:  Canes.  Walkers.  Scooters.  Crutches.  Turn on the lights when you go into a dark area. Replace any light bulbs as soon as they burn out.  Set up your furniture so you have a clear path. Avoid moving your furniture around.  If any of your floors are uneven, fix them.  If there are any pets around you, be aware of where they are.  Review your medicines with your doctor. Some medicines can make you feel dizzy. This can increase your chance of falling. Ask your doctor what other things that you can do to help prevent falls. This information is not intended to replace advice given to you by your health care provider. Make sure you discuss any questions you have with your health care provider. Document Released: 06/18/2009 Document Revised: 01/28/2016 Document Reviewed: 09/26/2014 Elsevier Interactive Patient Education  2017 [employer].

## 2020-09-17 LAB — CBC
Hematocrit: 41.4 % (ref 34.0–46.6)
Hemoglobin: 13.1 g/dL (ref 11.1–15.9)
MCH: 29.6 pg (ref 26.6–33.0)
MCHC: 31.6 g/dL (ref 31.5–35.7)
MCV: 94 fL (ref 79–97)
Platelets: 225 10*3/uL (ref 150–450)
RBC: 4.43 x10E6/uL (ref 3.77–5.28)
RDW: 13.5 % (ref 11.7–15.4)
WBC: 6.9 10*3/uL (ref 3.4–10.8)

## 2020-09-17 LAB — LIPID PANEL
Chol/HDL Ratio: 2.2 ratio (ref 0.0–4.4)
Cholesterol, Total: 153 mg/dL (ref 100–199)
HDL: 71 mg/dL (ref >39)
LDL Chol Calc (NIH): 65 mg/dL (ref 0–99)
Triglycerides: 95 mg/dL (ref 0–149)
VLDL Cholesterol Cal: 17 mg/dL (ref 5–40)

## 2020-09-17 LAB — HEMOGLOBIN A1C
Est. average glucose Bld gHb Est-mCnc: 114 mg/dL
Hgb A1c MFr Bld: 5.6 % (ref 4.8–5.6)

## 2020-09-17 LAB — T4, FREE: Free T4: 0.94 ng/dL (ref 0.82–1.77)

## 2020-09-17 LAB — CMP14+EGFR
ALT: 21 IU/L (ref 0–32)
AST: 23 IU/L (ref 0–40)
Albumin/Globulin Ratio: 2 (ref 1.2–2.2)
Albumin: 4.4 g/dL (ref 3.8–4.8)
Alkaline Phosphatase: 117 IU/L (ref 44–121)
BUN/Creatinine Ratio: 16 (ref 12–28)
BUN: 11 mg/dL (ref 8–27)
Bilirubin Total: 0.4 mg/dL (ref 0.0–1.2)
CO2: 27 mmol/L (ref 20–29)
Calcium: 9.4 mg/dL (ref 8.7–10.3)
Chloride: 103 mmol/L (ref 96–106)
Creatinine, Ser: 0.69 mg/dL (ref 0.57–1.00)
GFR calc Af Amer: 107 mL/min/{1.73_m2} (ref >59)
GFR calc non Af Amer: 93 mL/min/{1.73_m2} (ref >59)
Globulin, Total: 2.2 g/dL (ref 1.5–4.5)
Glucose: 92 mg/dL (ref 65–99)
Potassium: 4.9 mmol/L (ref 3.5–5.2)
Sodium: 144 mmol/L (ref 134–144)
Total Protein: 6.6 g/dL (ref 6.0–8.5)

## 2020-09-17 LAB — FERRITIN: Ferritin: 42 ng/mL (ref 15–150)

## 2020-09-17 LAB — HIGH SENSITIVITY CRP: CRP, High Sensitivity: 2.44 mg/L (ref 0.00–3.00)

## 2020-09-17 LAB — TSH: TSH: 2.61 u[IU]/mL (ref 0.450–4.500)

## 2020-09-25 ENCOUNTER — Other Ambulatory Visit: Payer: Self-pay | Admitting: Internal Medicine

## 2020-09-28 ENCOUNTER — Other Ambulatory Visit (HOSPITAL_COMMUNITY): Payer: Self-pay | Admitting: Interventional Radiology

## 2020-09-28 DIAGNOSIS — I729 Aneurysm of unspecified site: Secondary | ICD-10-CM

## 2020-09-28 NOTE — Progress Notes (Signed)
[facility]  PATIENT: [name] DOB: 03/07/1956   REASON FOR VISIT: Follow-up for nontraumatic intracerebral hemorrhage, abnormality of gait, right foot drop, complex partial seizure disorder, and vascular headaches   Chief complaint: Chief Complaint  Patient presents with  . Follow-up    RM 56 with husband (Ron) PT is well, has only had 2 falls since last visit     HISTORY OF PRESENT ILLNESS:   [name] is a 65 y.o. 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  Returns today, 09/29/2020, for 38-month follow-up.  Referred to PMR Dr. Letta Pate at prior visit d/t patient reporting worsening post stroke spasticity of right ankle and toes interfering with gait.  Personally reviewed OV note from Dr. Letta Pate and unfortunately did not experience significant benefit with use of Botox and plans on proceeding with right tibial nerve block this afternoon. denies new or worsening stroke/TIA symptoms.  Remains on Keppra for seizure prophylaxis tolerating well and no recent seizure activity.  She has since discontinued topiramate approximately 1 to 2 months ago due to infrequent headaches and hair loss concerns. She does report continued mild headaches and questions if this could be due to neck muscle tightness/stiffness. She does neck exercises with some improvement. Remains on aspirin, Plavix and Crestor tolerating well without side effects. Blood pressure today 118/81. Lab work recently completed on 09/16/2020 with LDL 65. She has scheduled follow-up MRA with Dr. Estanislado Pandy next month. No further concerns at this time.     REVIEW OF SYSTEMS: Full 14 system review of systems performed and notable only for those  listed, all others are neg:  Constitutional: neg Cardiovascular: neg Ear/Nose/Throat: neg  Skin: neg Eyes: neg Respiratory: neg Gastroitestinal: neg  Hematology/Lymphatic: neg Endocrine: neg Musculoskeletal: Walking difficulty Allergy/Immunology: neg Neurological: Weakness, spasticity, headache, seizures Psychiatric: neg Sleep : neg   ALLERGIES: Allergies  Allergen Reactions  . Phenytoin Anaphylaxis, Hives and Swelling    HOME MEDICATIONS: Outpatient Medications Prior to Visit  Medication Sig Dispense Refill  . albuterol (PROVENTIL) (2.5 MG/3ML) 0.083% nebulizer solution Take 3 mLs (2.5 mg total) by nebulization every 6 (six) hours. 1080 mL 1  . albuterol (VENTOLIN HFA) 108 (90 Base) MCG/ACT inhaler Inhale 2 puffs into the lungs every 6 (six) hours as needed for wheezing or shortness of breath. 24 g 1  . ALPRAZolam (XANAX) 0.25 MG tablet TAKE 1 TABLET TWICE A DAY AS NEEDED 60 tablet 2  . aspirin EC 81 MG tablet Take 81 mg by mouth daily.    . Biotin 1 MG CAPS Take by mouth.    . Cholecalciferol (VITAMIN D-3) 1000 units CAPS Take 1,000 Units by mouth daily.    . citalopram (CELEXA) 40 MG tablet Take 1 tablet (40 mg total) by mouth at bedtime. 90 tablet 1  . clopidogrel (PLAVIX) 75 MG tablet TAKE 1 TABLET DAILY 90 tablet 3  . CRANBERRY PO Take 2 tablets by mouth 2 (two) times daily.    . cyclobenzaprine (FLEXERIL) 10 MG tablet TAKE 1 TABLET AT BEDTIME AS NEEDED 60 tablet 5  . diclofenac Sodium (VOLTAREN) 1 % GEL Voltaren 1 % topical gel  APPLY 2 GRAM TO THE AFFECTED AREA(S) BY TOPICAL ROUTE 4 TIMES PER DAY 350 g 2  . fluticasone (FLONASE) 50 MCG/ACT nasal spray Place 2 sprays into  both nostrils daily as needed (allergies or post-nasl drip).   3  . levETIRAcetam (KEPPRA) 500 MG tablet Take 1 tablet (500 mg total) by mouth every 12 (twelve) hours. 180 tablet 3  . levothyroxine (SYNTHROID) 50 MCG tablet TAKE 1 TABLET ON MONDAY THROUGH WEDNESDAY 39 tablet 3  . levothyroxine  (SYNTHROID) 75 MCG tablet TAKE 1 TABLET IN THE MORNING BEFORE BREAKFAST ON THURSDAY THROUGH "SUNDAY (TAKE 50 MCG ON OTHER DAYS) 51 tablet 3  . losartan (COZAAR) 100 MG tablet Take 100 mg by mouth every morning.    . Multiple Vitamins-Minerals (PRESERVISION/LUTEIN PO) Take by mouth.     . NON FORMULARY preser vision 2 tabs daily    . NONFORMULARY OR COMPOUNDED ITEM Apply 240 mg topically daily as needed. 1 each 3  . Omega-3 Fatty Acids (FISH OIL) 1000 MG CAPS Take 2,000 mg by mouth 2 (two) times daily.     . rosuvastatin (CRESTOR) 10 MG tablet TAKE 1 TABLET DAILY 90 tablet 3  . telmisartan (MICARDIS) 20 MG tablet TAKE 1 TABLET DAILY 90 tablet 3  . vitamin C (ASCORBIC ACID) 500 MG tablet Take 500 mg by mouth daily.    . vitamin E 1000 UNIT capsule Take 1,000 Units by mouth at bedtime.     No facility-administered medications prior to visit.    PAST MEDICAL HISTORY: Past Medical History:  Diagnosis Date  . Abnormal gait   . Amnesia   . Anemia    hx  . Aneurysm (HCC)   . Anxiety   . Asthma   . Atrophic vaginitis   . Benign hypertensive heart disease   . Cardiomegaly   . Chest pain   . COPD (chronic obstructive pulmonary disease) (HCC)     - PFTs  01/24/06 FEV1 69% ratio 68% diffusing capacity 57% with no improvement after B2   - PFT's 10/1/ 09 FEV1 77   ratio  76    dlco                   45"             no resp to B2    - Nl alpha one antitripsin level 10/09  . Cough   . Depression   . Difficulty speaking   . Disorder of nasal cavity   . Dyspnea   . Enthesopathy of elbow region   . GERD (gastroesophageal reflux disease)   . Hammer toe   . Headache(784.0)   . Hemiparesis affecting dominant side as late effect of cerebrovascular accident ([place])   . History of kidney stones   . Hyperlipidemia   . Hypertension   . Hypothyroidism   . Increased urinary frequency   . Low compliance bladder   . Macular degeneration of both eyes   . Malaise and fatigue   . Nonruptured cerebral  aneurysm   . Obesity   . Pneumonia    hx  . Pure hypercholesterolemia   . Recurrent major depressive episodes ([place])   . Seizure disorder, secondary ([place])    2-3 focal siezures monthly  . Seizures ([place])   . Short-term memory loss    since stroke  . Skin sensation disturbance   . Stroke [facility]) 2011   rt sided weakness  . Swelling of limb   . TMJ (temporomandibular joint disorder)   . Urinary tract infectious disease   . Vertigo as late effect of stroke   . Vitamin D deficiency     PAST SURGICAL HISTORY: Past  Surgical History:  Procedure Laterality Date  . BACK SURGERY    . CEREBRAL ANGIOGRAM     x3  . COLONOSCOPY WITH PROPOFOL N/A 08/18/2016   Procedure: COLONOSCOPY WITH PROPOFOL;  Surgeon: Carol Ada, MD;  Location: [facility];  Service: Endoscopy;  Laterality: N/A;  . LAPAROSCOPIC HYSTERECTOMY    . MANDIBLE FRACTURE SURGERY    . RADIOLOGY WITH ANESTHESIA N/A 08/21/2013   Procedure: RADIOLOGY WITH ANESTHESIA;  Surgeon: Rob Hickman, MD;  Location: [facility];  Service: Radiology;  Laterality: N/A;  . stones     no surgery    FAMILY HISTORY: Family History  Problem Relation Age of Onset  . Heart disease Mother   . Heart disease Father   . Heart attack Father   . Cancer Father        prostate to lymph node  . Lung cancer Other   . Breast cancer Neg Hx     SOCIAL HISTORY: Social History   Socioeconomic History  . Marital status: Married    Spouse name: Jori Moll  . Number of children: 2  . Years of education: 53  . Highest education level: Not on file  Occupational History  . 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 Smoking status: Former Smoker    Packs/day: 2.00    Years: 30.00    Pack years: 60.00    Types: Cigarettes    Quit date: 11/27/2009    Years since quitting: 10.8  . Smokeless tobacco: Never Used  . Tobacco comment: smoked 2.5 ppd x 5 years, 1ppd x 20+ years  Vaping Use  . Vaping Use: Never used   Substance and Sexual Activity  . Alcohol use: No    Alcohol/week: 0.0 standard drinks    Comment: patient quit alcohol use 2005.   . Drug use: No  . Sexual activity: Not Currently  Other Topics Concern  . Not on file  Social History Narrative   Patient lives at home with her husband Jori Moll).   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    Patient is on disability.    Patient has an Associates degree.   Patient is right-handed.   Patient drinks 2 cups of green tea most days.               Social Determinants of Health   Financial Resource Strain: Low Risk   . Difficulty of Paying Living Expenses: Not hard at all  Food Insecurity: No Food Insecurity  . Worried About Charity fundraiser in the Last Year: Never true  . Ran Out of Food in the Last Year: Never true  Transportation Needs: No Transportation Needs  . Lack of Transportation (Medical): No  . Lack of Transportation (Non-Medical): No  Physical Activity: Inactive  . Days of Exercise per Week: 0 days  . Minutes of Exercise per Session: 0 min  Stress: No Stress Concern Present  . Feeling of Stress : Not at all  Social Connections: Not on file  Intimate Partner Violence: Not on file     PHYSICAL EXAM  Vitals:   09/29/20 0929  Weight: 161 lb 6.4 oz (73.2 kg)  Height: 5\' 2"  (1.575 m)   Body mass index is 29.52 kg/m.  Generalized: Well developed, pleasant middle-age Caucasian female, in no acute distress  Head: normocephalic and atraumatic,. Oropharynx benign  Neck: Supple, no carotid bruits  Cardiac: Regular  rate rhythm, no murmur  Skin no rash few bruises Musculoskeletal: Mild tenderness to palpation over cervical paraspinal muscles. Full cervical range of motion with some discomfort looking side to side  Neurological examination   Mentation: Alert oriented to time, place, history taking. Follows all commands speech and language fluent  Cranial nerve II-XII: Pupils  were equal round reactive to light extraocular movements were full, visual field were full on confrontational test. Right lower facial weakness (most notable when smiling). hearing intact.  Uvula tongue midline. head turning and shoulder shrug were normal and symmetric.Tongue protrusion into cheek strength was normal. Motor: Full strength left upper and lower extremity. RUE 4/5; RLE: 4/5 hip flexor, knee extension and flexion; 3/5 ankle dorsiflexion and plantarflexion with increased tone distally Sensory: Touch and pinprick are normal on the left mildly decreased on the right, vibratory is decreased on the right lower extremity  Coordination: Impaired on the right normal on the left  Reflexes: Brisk reflexes currently otherwise 2+ Gait and Station: Rising up from seated position without assistance, wide based hemiplegic gait right foot drop using  single-point cane with mild imbalance    DIAGNOSTIC DATA (LABS, IMAGING, TESTING) - I reviewed patient records, labs, notes, testing and imaging myself where available.  Lab Results  Component Value Date   WBC 6.9 09/16/2020   HGB 13.1 09/16/2020   HCT 41.4 09/16/2020   MCV 94 09/16/2020   PLT 225 09/16/2020      Component Value Date/Time   NA 144 09/16/2020 1429   K 4.9 09/16/2020 1429   CL 103 09/16/2020 1429   CO2 27 09/16/2020 1429   GLUCOSE 92 09/16/2020 1429   GLUCOSE 94 05/28/2015 1302   BUN 11 09/16/2020 1429   CREATININE 0.69 09/16/2020 1429   CALCIUM 9.4 09/16/2020 1429   PROT 6.6 09/16/2020 1429   ALBUMIN 4.4 09/16/2020 1429   AST 23 09/16/2020 1429   ALT 21 09/16/2020 1429   ALKPHOS 117 09/16/2020 1429   BILITOT 0.4 09/16/2020 1429   GFRNONAA 93 09/16/2020 1429   GFRAA 107 09/16/2020 1429   Lipid Panel     Component Value Date/Time   CHOL 153 09/16/2020 1429   TRIG 95 09/16/2020 1429   HDL 71 09/16/2020 1429   CHOLHDL 2.2 09/16/2020 1429   CHOLHDL 2.8 11/23/2009 0435   VLDL 15 11/23/2009 0435   LDLCALC 65  09/16/2020 1429   LABVLDL 17 09/16/2020 1429         ASSESSMENT AND PLAN 65y.o. year old female has a past medical history of left frontoparietal parenchymal hemorrhage March 2011 from PRES and malignant hypertension with residual right spastic hemiparesis. Chronic headaches with history of migraines  Placement of pipeline stent across Left PICA aneurysm by Dr. Estanislado Pandy on 08/21/2013. History of seizure disorder stable on Keppra.    Left frontoparietal hemorrhage 10/2009 -Residual deficits right spastic hemiparesis -plans on undergoing right tibial nerve block with phenol with Dr. Dianna Limbo today as no significant benefit with Botox  -Continue compounding cream: Diclofenac, baclofen, cyclobenzaprine, gabapentin and bupivacaine -Continue aspirin, Plavix and Crestor for secondary stroke prevention -Discussed secondary stroke prevention measures and importance of close PCP follow-up for aggressive stroke risk factor management including HTN with BP goal <130/90 and HLD with LDL below<70  Seizure disorder -Stable without recurrence -Continue Keppra 500 mg twice daily for seizure prophylaxis -refill provided  Chronic headaches with migraine history -Recent migraine occurrence -Mild tension type headaches persist -Referral placed to PT for dry needling as cervical tightness/stiffness may  be contributing -Previously on Topamax - d/c'd 1-2 mo ago -she is concerned regarding possible hair loss side effect-advised her that if she continues to experience hair loss despite discontinuing topiramate after at least 6 months duration, recommend further discussion with PCP and possible evaluation by dermatology to rule out other contributing factors    Follow-up in 6 months or call earlier if needed   CC:  [facility] provider: Dr. Henriette Combs, Bailey Mech, MD    I spent 30 minutes of face-to-face and non-face-to-face time with patient and husband.  This included previsit chart review, lab review, study  review, order entry, electronic health record documentation, patient education regarding history of hemorrhage with residual deficits, history of seizures and ongoing use of AED, chronic headaches, importance of managing stroke risk factors and answered all other questions to patient and husband satisfaction   Frann Rider, AGNP-BC  [facility] [address] [place], [place] [address]  Phone [phone] Fax [phone] Note: This document was prepared with digital dictation and possible smart phrase technology. Any transcriptional errors that result from this process are unintentional.

## 2020-09-29 ENCOUNTER — Encounter (HOSPITAL_BASED_OUTPATIENT_CLINIC_OR_DEPARTMENT_OTHER): Payer: Medicare Other | Admitting: Physical Medicine & Rehabilitation

## 2020-09-29 ENCOUNTER — Other Ambulatory Visit: Payer: Self-pay

## 2020-09-29 ENCOUNTER — Ambulatory Visit (INDEPENDENT_AMBULATORY_CARE_PROVIDER_SITE_OTHER): Payer: Medicare Other | Admitting: Adult Health

## 2020-09-29 ENCOUNTER — Encounter: Payer: Self-pay | Admitting: Physical Medicine & Rehabilitation

## 2020-09-29 ENCOUNTER — Encounter: Payer: Self-pay | Admitting: Adult Health

## 2020-09-29 VITALS — BP 123/81 | HR 96 | Temp 97.9°F | Ht 62.0 in | Wt 160.4 lb

## 2020-09-29 VITALS — BP 118/81 | HR 84 | Ht 62.0 in | Wt 161.4 lb

## 2020-09-29 DIAGNOSIS — Z8673 Personal history of transient ischemic attack (TIA), and cerebral infarction without residual deficits: Secondary | ICD-10-CM

## 2020-09-29 DIAGNOSIS — G8110 Spastic hemiplegia affecting unspecified side: Secondary | ICD-10-CM | POA: Diagnosis not present

## 2020-09-29 DIAGNOSIS — G8929 Other chronic pain: Secondary | ICD-10-CM

## 2020-09-29 DIAGNOSIS — G40909 Epilepsy, unspecified, not intractable, without status epilepticus: Secondary | ICD-10-CM

## 2020-09-29 DIAGNOSIS — R519 Headache, unspecified: Secondary | ICD-10-CM

## 2020-09-29 DIAGNOSIS — R29898 Other symptoms and signs involving the musculoskeletal system: Secondary | ICD-10-CM

## 2020-09-29 MED ORDER — LEVETIRACETAM 500 MG PO TABS: 500.0000 mg | ORAL_TABLET | Freq: Two times a day (BID) | ORAL | 3 refills | Status: DC

## 2020-09-29 NOTE — Progress Notes (Signed)
Phenol neurolysis of the RIght tibial nerve ? 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 ? ?Informed consent was obtained after describing the risks and benefits of the procedure with the patient this includes bleeding bruising and infection as well as medication side effects. The patient elected to proceed and has given written consent. Patient placed in a prone position on the exam table. External DC stimulation was applied to the popliteal space using a nerve stimulator. Plantar flexion twitch was obtained. The popliteal region was prepped with Betadine and then entered with a 22-gauge 40 mm needle electrode under electrical stimulation guidance. Plantar flexion which was obtained and confirmed. Then 4 cc of 5% phenol were injected. The patient tolerated procedure well. Post procedure instructions and followup visit were given.  ?

## 2020-09-29 NOTE — Patient Instructions (Signed)
Continue compounding cream for right foot spasticity   Continue keppra 500mg  twice daily for seizure prevention  Referral placed to PT for dry needling   Continue aspirin 81 mg daily and clopidogrel 75 mg daily  and Crestor  for secondary stroke prevention  Continue to follow up with PCP regarding cholesterol and blood pressure management  Maintain strict control of hypertension with blood pressure goal below 130/90 and cholesterol with LDL cholesterol (bad cholesterol) goal below 70 mg/dL.       Followup in the future with me in 6 months or call earlier if needed       Thank you for coming to see [place] at [facility]. I hope we have been able to provide you high quality care today.  You may receive a patient satisfaction survey over the next few weeks. We would appreciate your feedback and comments so that we may continue to improve ourselves and the health of our patients.

## 2020-09-29 NOTE — Progress Notes (Signed)
I agree with the above plan 

## 2020-09-29 NOTE — Patient Instructions (Signed)
Tibial nerve block with phenol today. This medication may start taking the fact today however full effect will be at about one week Duration of the effect is 3-6 months Side effects of medication may include right heel numbness or burning. Call if you have burning pain so we can recommend any medication for that. 

## 2020-10-02 ENCOUNTER — Ambulatory Visit
Admission: RE | Admit: 2020-10-02 | Discharge: 2020-10-02 | Disposition: A | Discharge status: Home / Self Care | Payer: Medicare Other | Source: Ambulatory Visit | Attending: Internal Medicine | Admitting: Internal Medicine

## 2020-10-02 DIAGNOSIS — J432 Centrilobular emphysema: Secondary | ICD-10-CM | POA: Diagnosis not present

## 2020-10-02 DIAGNOSIS — Z87891 Personal history of nicotine dependence: Secondary | ICD-10-CM

## 2020-10-02 DIAGNOSIS — I2510 Atherosclerotic heart disease of native coronary artery without angina pectoris: Secondary | ICD-10-CM | POA: Diagnosis not present

## 2020-10-02 DIAGNOSIS — J479 Bronchiectasis, uncomplicated: Secondary | ICD-10-CM | POA: Diagnosis not present

## 2020-10-14 ENCOUNTER — Other Ambulatory Visit: Payer: Self-pay

## 2020-10-14 ENCOUNTER — Ambulatory Visit (HOSPITAL_COMMUNITY)
Admission: RE | Admit: 2020-10-14 | Discharge: 2020-10-14 | Disposition: A | Discharge status: Home / Self Care | Payer: Medicare Other | Source: Ambulatory Visit | Attending: Interventional Radiology | Admitting: Interventional Radiology

## 2020-10-14 DIAGNOSIS — I729 Aneurysm of unspecified site: Secondary | ICD-10-CM

## 2020-10-14 DIAGNOSIS — I671 Cerebral aneurysm, nonruptured: Secondary | ICD-10-CM | POA: Insufficient documentation

## 2020-10-14 DIAGNOSIS — Q283 Other malformations of cerebral vessels: Secondary | ICD-10-CM | POA: Diagnosis not present

## 2020-10-14 NOTE — Telephone Encounter (Signed)
Can we set her up for a follow up to discuss? There are no urgent findings on her CT but we can discuss the results from the cardiac perspective. Thanks.

## 2020-10-14 NOTE — Telephone Encounter (Signed)
Pt made aware and appointment scheduled for 2/24.

## 2020-10-16 ENCOUNTER — Encounter: Payer: Self-pay | Admitting: Physical Therapy

## 2020-10-16 ENCOUNTER — Ambulatory Visit: Payer: Medicare Other | Attending: Adult Health | Admitting: Physical Therapy

## 2020-10-16 ENCOUNTER — Other Ambulatory Visit: Payer: Self-pay

## 2020-10-16 DIAGNOSIS — M62838 Other muscle spasm: Secondary | ICD-10-CM | POA: Insufficient documentation

## 2020-10-16 DIAGNOSIS — R293 Abnormal posture: Secondary | ICD-10-CM | POA: Diagnosis not present

## 2020-10-16 DIAGNOSIS — H353211 Exudative age-related macular degeneration, right eye, with active choroidal neovascularization: Secondary | ICD-10-CM | POA: Diagnosis not present

## 2020-10-16 DIAGNOSIS — Z79899 Other long term (current) drug therapy: Secondary | ICD-10-CM | POA: Diagnosis not present

## 2020-10-16 DIAGNOSIS — H353122 Nonexudative age-related macular degeneration, left eye, intermediate dry stage: Secondary | ICD-10-CM | POA: Diagnosis not present

## 2020-10-16 DIAGNOSIS — M542 Cervicalgia: Secondary | ICD-10-CM | POA: Insufficient documentation

## 2020-10-16 DIAGNOSIS — H2513 Age-related nuclear cataract, bilateral: Secondary | ICD-10-CM | POA: Diagnosis not present

## 2020-10-16 NOTE — Therapy (Signed)
[place], [place], [address] Phone: [phone]   Fax:  [phone]  Physical Therapy Evaluation  Patient Details  Name: [name] MRN: [id number] Date of Birth: [birth year] Referring Provider (PT): Frann Rider, NP   Encounter Date: 10/16/2020   PT End of Session - 10/16/20 1048    Visit Number 1    Number of Visits 13    Date for PT Re-Evaluation 11/27/20    Authorization Type MCR: Kx mod at15th visit, FOTO at 6th and 10th visit.    PT Start Time 1100    PT Stop Time 1145    PT Time Calculation (min) 45 min    Activity Tolerance Patient tolerated treatment well    Behavior During Therapy WFL for tasks assessed/performed           Past Medical History:  Diagnosis Date  . Abnormal gait   . Amnesia   . Anemia    hx  . Aneurysm ([place])   . Anxiety   . Asthma   . Atrophic vaginitis   . Benign hypertensive heart disease   . Cardiomegaly   . Chest pain   . COPD (chronic obstructive pulmonary disease) ([place])     - PFTs  01/24/06 FEV1 69% ratio 68% diffusing capacity 57% with no improvement after B2   - PFT's 10/1/ 09 FEV1 77   ratio  76    dlco                   45            no resp to B2    - Nl alpha one antitripsin level 10/09  . Cough   . Depression   . Difficulty speaking   . Disorder of nasal cavity   . Dyspnea   . Enthesopathy of elbow region   . GERD (gastroesophageal reflux disease)   . Hammer toe   . Headache(784.0)   . Hemiparesis affecting dominant side as late effect of cerebrovascular accident ([place])   . History of kidney stones   . Hyperlipidemia   . Hypertension   . Hypothyroidism   . Increased urinary frequency   . Low compliance bladder   . Macular degeneration of both eyes   . Malaise and fatigue   . Nonruptured cerebral aneurysm   . Obesity   . Pneumonia    hx  . Pure hypercholesterolemia   . Recurrent major depressive episodes ([place])   . Seizure disorder,  secondary ([place])    2-3 focal siezures monthly  . Seizures ([place])   . Short-term memory loss    since stroke  . Skin sensation disturbance   . Stroke [facility]) 2011   rt sided weakness  . Swelling of limb   . TMJ (temporomandibular joint disorder)   . Urinary tract infectious disease   . Vertigo as late effect of stroke   . Vitamin D deficiency     Past Surgical History:  Procedure Laterality Date  . BACK SURGERY    . CEREBRAL ANGIOGRAM     x3  . COLONOSCOPY WITH PROPOFOL N/A 08/18/2016   Procedure: COLONOSCOPY WITH PROPOFOL;  Surgeon: Carol Ada, MD;  Location: [facility];  Service: Endoscopy;  Laterality: N/A;  . LAPAROSCOPIC HYSTERECTOMY    . MANDIBLE FRACTURE SURGERY    . RADIOLOGY WITH ANESTHESIA N/A 08/21/2013   Procedure: RADIOLOGY WITH ANESTHESIA;  Surgeon: Rob Hickman, MD;  Location: [place];  Service: Radiology;  Laterality: N/A;  . stones     no surgery    There were no vitals filed for this visit.    Subjective Assessment - 10/16/20 1102    Subjective pt is a 65 yo F with CC of neck pain/ stiffness with concurrent HA that has been present since the stroke. she has been taking medication for HA but reported she stopped taking due to hair loss. She has a hx of 2 strokes back in 2011 and had undergone PT previously. she reports HA daily, but does have occasional signficiatn. migraines. She reports the stiffness in the neck is constant and bil shoulders. since onset she reports pain seems to be worsening.    Pertinent History significant PMHx    Limitations Walking;Standing    How long can you sit comfortably? unlimited    How long can you stand comfortably? 10 min with [facility]    How long can you walk comfortably? 15- 20 min with [facility]    Diagnostic tests 10/14/2020 MRI   IMPRESSION:  Stenting of distal left vertebral artery which is patent. Small left  PICA aneurysm unchanged     Unfortunately, despite 2 attempts, the left pericallosal aneurysm is  not fully imaged. If  desired, the patient could return for  additional MRA imaging.    Patient Stated Goals stop headaches, reducce stiffnes    Currently in Pain? Yes    Pain Score 6    at worst 8/10   Pain Orientation Right;Left   R>L   Pain Descriptors / Indicators Tightness;Throbbing;Aching;Sore;Headache    Pain Type Chronic pain    Pain Onset More than a month ago    Pain Frequency Constant    Aggravating Factors  looking to the R and L,    Pain Relieving Factors N/a    Effect of Pain on Daily Activities limited ROM              [facility] PT Assessment - 10/16/20 1045      Assessment   Medical Diagnosis Chronic nonintractable headache, unspecified headache type (R51.9, G89.29), Neck tightness (J62.831)    Referring Provider (PT) Frann Rider, NP    Onset Date/Surgical Date --   2011   Hand Dominance Right    Next MD Visit 04/29/2021    Prior Therapy yes   for strok     Precautions   Precautions None      Restrictions   Weight Bearing Restrictions No      Balance Screen   Has the patient fallen in the past 6 months Yes    How many times? 1    Has the patient had a decrease in activity level because of a fear of falling?  Yes    Is the patient reluctant to leave their home because of a fear of falling?  Yes      [place] Private residence    Living Arrangements Spouse/significant other    Available Help at Discharge Family    Type of [place] to enter    Entrance Stairs-Number of Steps 5    Entrance Stairs-Rails Right    [place] Two level;Able to live on main level with bedroom/bathroom    [place] - 2 wheels;Cane - single point;Wheelchair - manual;Shower seat   TENS, R ankle AFO     Prior Function   Level of Independence Independent with basic ADLs    Vocation On disability  Cognition   Overall Cognitive Status Within Functional Limits for tasks assessed      Observation/Other Assessments   Focus on  Therapeutic Outcomes (FOTO)  neck initally 44% , craniofacisl 37%   neck predicted 55%, craniofacial 47%     Posture/Postural Control   Posture/Postural Control Postural limitations      ROM / Strength   AROM / PROM / Strength AROM;Strength      AROM   AROM Assessment Site Shoulder;Cervical    Right/Left Shoulder --   WFL   Cervical Flexion 52    Cervical Extension 30   concordant pain at end range noted   Cervical - Right Side Bend 23   ipsilateral soreness at end range   Cervical - Left Side Bend 23   ipsilateral soreness at end range   Cervical - Right Rotation 45   ipsilateral soreness at end range   Cervical - Left Rotation 53   mild ipsilateral soreness at end range     Strength   Strength Assessment Site Shoulder    Right/Left Shoulder Right;Left    Right Shoulder Flexion 4/5    Right Shoulder Extension 4-/5    Right Shoulder ABduction 4-/5    Right Shoulder Internal Rotation 4/5    Right Shoulder External Rotation 4-/5    Left Shoulder Flexion 4+/5    Left Shoulder Extension 4+/5    Left Shoulder ABduction 4/5    Left Shoulder Internal Rotation 4/5    Left Shoulder External Rotation 4-/5      Palpation   Palpation comment TTP with bil uppertrap/ levator scapualr, cervical parapsinals and sub-occipitals                      Objective measurements completed on examination: See above findings.       [place] Adult PT Treatment/Exercise - 10/16/20 1045      Exercises   Exercises Neck      Neck Exercises: Seated   Neck Retraction 5 secs;5 reps    Other Seated Exercise scapular retractoin 1 x 5 holding 5 seconds   verbal cues to avoid hiking shoulder up     Neck Exercises: Stretches   Upper Trapezius Stretch 2 reps;Left;Right;30 seconds    Levator Stretch 1 rep;Left;Right;30 seconds            Trigger Point Dry Needling - 10/16/20 0001    Consent Given? Yes    Education Handout Provided Yes    Muscles Treated Head and Neck Upper trapezius     Upper Trapezius Response Twitch reponse elicited;Palpable increased muscle length   Bil               PT Education - 10/16/20 1047    Education Details evaluation findings, POC, goals, HEP with properform/ rationale, muscle anatomy of surrounding area.  FOTO assessment. What TPDN is, benefits and what to expect.    Person(s) Educated Patient    Methods Explanation;Verbal cues;Handout    Comprehension Verbalized understanding;Verbal cues required            PT Short Term Goals - 10/16/20 1120      PT SHORT TERM GOAL #1   Title pt to be IND with initial HEP    Time 3    Period Weeks    Status New    Target Date 11/06/20      PT SHORT TERM GOAL #2   Title pt to verbalize / demo efficient posture to reduce muscle tension  and neck pain/ headaches.    Time 3    Period Weeks    Status New    Target Date 11/06/20             PT Long Term Goals - 10/16/20 1316      PT LONG TERM GOAL #1   Title pt to increase cerivcal rotaiton by >/= 5 degrees and maintain all other functional ranges with </=2/10 max pain for ADLs    Baseline -    Time 6    Period Weeks    Status New    Target Date 11/27/20      PT LONG TERM GOAL #2   Title increase bil gross shoulder strength to >/= 4+/5 to promote postureal efficiency    Baseline -    Time 6    Period Weeks    Status New    Target Date 11/27/20      PT LONG TERM GOAL #3   Title increase neck FOTO to >/= 55% and craniofacial to >/= 47% to demo improvement in functoin    Baseline -    Time 6    Period Weeks    Status New    Target Date 11/27/20      PT LONG TERM GOAL #4   Title pt to report HA frequency to </= 1 every 2 weeks for improvement in condition and QOL    Baseline -    Time 6    Period Weeks    Status New    Target Date 11/27/20      PT LONG TERM GOAL #5   Title pt to be IND with all HEP and is able to maintain and progress current LOF IND    Baseline -    Time 6    Period Weeks    Status New    Target  Date 11/27/20                  Plan - 10/16/20 1048    Clinical Impression Statement pt is a pleasant 65 y.o F presenting to [place] with CC of neck pain/ stiffness and HA starting since she had 2 strokes in 2011. She demonstrats functional shoulder AROM and mild cervical ROM deficits with report of concordant symptoms with bil rotation/ sidebending, mild weakness in bil shoulders with R>L. TTP with multiple trigger points in bil upper trap/ levator scapulae, cervical paraspinals and sub-occipitals. educated and consent was provided for TPDN for bil upper trap which she noted significant relief end of session. She would benefit from physical therapy to decrease neck pain, reduce muscle tension, promote neck ROM, improve posture efficiency and maximize her function by addressing the deficits listed.    Personal Factors and Comorbidities Comorbidity 3+;Past/Current Experience    Comorbidities hx of 2 strokes, anxiety, depression    Stability/Clinical Decision Making Evolving/Moderate complexity    Clinical Decision Making Moderate    Rehab Potential Good    PT Frequency 2x / week    PT Duration 6 weeks    PT Treatment/Interventions ADLs/Self Care Home Management;Cryotherapy;Electrical Stimulation;Iontophoresis 4mg /ml Dexamethasone;Moist Heat;Ultrasound;Neuromuscular re-education;Therapeutic exercise;Therapeutic activities;Functional mobility training;Patient/family education;Manual techniques;Passive range of motion;Dry needling;Taping    PT Next Visit Plan review/ update HEP PRN, response DN and continue for bil upper trap. posture education, posterior shoulder strengthening,    PT Home Exercise Plan [id number] - seated chin tuck, upper trap stretch, levator scapuale stretch, scapular retraction    Consulted and Agree with Plan of Care Patient  Patient will benefit from skilled therapeutic intervention in order to improve the following deficits and impairments:  Abnormal  gait,Decreased strength,Improper body mechanics,Increased muscle spasms,Pain,Postural dysfunction,Decreased activity tolerance,Decreased endurance,Decreased range of motion  Visit Diagnosis: Cervicalgia  Other muscle spasm  Abnormal posture     Problem List Patient Active Problem List   Diagnosis Date Noted  . Family history of heart disease 06/14/2020  . History of CVA (cerebrovascular accident) 06/14/2020  . Exudative age-related macular degeneration of right eye with active choroidal neovascularization ([place]) 09/11/2019  . Nocturnal hypoxemia 09/11/2019  . Non-restorative sleep 06/13/2019  . Benign myoclonus of drowsiness 06/13/2019  . Excessive daytime sleepiness 06/13/2019  . 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  . Hemiparesis affecting right side as late effect of cerebrovascular accident ([place]) 06/13/2019  . Macular degeneration of right eye 06/11/2019  . Class 1 obesity due to excess calories with serious comorbidity and body mass index (BMI) of 31.0 to 31.9 in adult 06/11/2019  . Moderate episode of recurrent major depressive disorder ([place]) 06/11/2019  . Spastic hemiparesis ([place]) 03/25/2019  . Prediabetes 01/01/2019  . Acquired hypothyroidism 01/01/2019  . Hypertension 01/01/2019  . Aortic atherosclerosis ([place]) 11/07/2018  . History of embolic stroke without residual deficits 03/29/2018  . Seizure disorder ([place]) 01/12/2017  . Common migraine 01/12/2017  . Aneurysm ([place])   . Brain aneurysm 08/21/2013  . Right foot drop 04/10/2013  . Intracerebral hemorrhage ([place]) 03/19/2013  . Convulsions ([place]) 03/19/2013  . Headache 03/19/2013  . Abnormality of gait 03/19/2013  . Edema 02/23/2011  . COUGH 06/10/2008  . HYPERLIPIDEMIA 04/25/2008  . TOBACCO ABUSE 04/25/2008  . ASTHMA 04/25/2008  . Chronic obstructive pulmonary disease ([place]) 04/25/2008    Starr [place] PT, DPT, LAT, ATC  10/16/20  1:24 PM      [place]  [facility] [address] [place], [place], [address] Phone: [phone]   Fax:  [phone]  Name: [name] MRN: [id number] Date of Birth: 04/03/1956

## 2020-10-20 ENCOUNTER — Telehealth (HOSPITAL_COMMUNITY): Payer: Self-pay

## 2020-10-20 NOTE — Telephone Encounter (Signed)
Pt agreed to f/u in 6 months with mra head wo. AW  

## 2020-10-22 ENCOUNTER — Other Ambulatory Visit: Payer: Self-pay | Admitting: Pulmonary Disease

## 2020-10-25 ENCOUNTER — Encounter: Payer: Self-pay | Admitting: Cardiology

## 2020-10-27 ENCOUNTER — Telehealth: Payer: Self-pay | Admitting: Pulmonary Disease

## 2020-10-27 ENCOUNTER — Other Ambulatory Visit (HOSPITAL_COMMUNITY): Payer: Self-pay | Admitting: Interventional Radiology

## 2020-10-27 DIAGNOSIS — I671 Cerebral aneurysm, nonruptured: Secondary | ICD-10-CM

## 2020-10-27 NOTE — Telephone Encounter (Signed)
Call made to patient, confirmed DOB. Made aware she is due for f/u appt and she can have a limited supply contingent upon her making the appt. Appt made. Patient voices she has enough to last her until the appt.   Nothing further needed at this time.

## 2020-10-28 ENCOUNTER — Ambulatory Visit (HOSPITAL_COMMUNITY)
Admission: RE | Admit: 2020-10-28 | Discharge: 2020-10-28 | Disposition: A | Discharge status: Home / Self Care | Payer: Medicare Other | Source: Ambulatory Visit | Attending: Interventional Radiology | Admitting: Interventional Radiology

## 2020-10-28 ENCOUNTER — Other Ambulatory Visit: Payer: Self-pay

## 2020-10-28 ENCOUNTER — Encounter (HOSPITAL_COMMUNITY): Payer: Self-pay

## 2020-10-28 DIAGNOSIS — I671 Cerebral aneurysm, nonruptured: Secondary | ICD-10-CM

## 2020-10-29 ENCOUNTER — Encounter: Payer: Self-pay | Admitting: Cardiology

## 2020-10-29 ENCOUNTER — Telehealth (INDEPENDENT_AMBULATORY_CARE_PROVIDER_SITE_OTHER): Payer: Medicare Other | Admitting: Cardiology

## 2020-10-29 VITALS — Ht 62.0 in | Wt 160.0 lb

## 2020-10-29 DIAGNOSIS — Z79899 Other long term (current) drug therapy: Secondary | ICD-10-CM

## 2020-10-29 DIAGNOSIS — Z8673 Personal history of transient ischemic attack (TIA), and cerebral infarction without residual deficits: Secondary | ICD-10-CM

## 2020-10-29 DIAGNOSIS — I2510 Atherosclerotic heart disease of native coronary artery without angina pectoris: Secondary | ICD-10-CM | POA: Diagnosis not present

## 2020-10-29 DIAGNOSIS — I700 Atherosclerosis of aorta: Secondary | ICD-10-CM

## 2020-10-29 DIAGNOSIS — E785 Hyperlipidemia, unspecified: Secondary | ICD-10-CM

## 2020-10-29 MED ORDER — ROSUVASTATIN CALCIUM 20 MG PO TABS: 20.0000 mg | ORAL_TABLET | Freq: Every day | ORAL | 3 refills | Status: DC

## 2020-10-29 NOTE — Progress Notes (Signed)
Virtual Visit via Video Note   This visit type was conducted due to national recommendations for restrictions regarding the COVID-19 Pandemic (e.g. social distancing) in an effort to limit this patient's exposure and mitigate transmission in our community.  Due to her co-morbid illnesses, this patient is at least at moderate risk for complications without adequate follow up.  This format is felt to be most appropriate for this patient at this time.  All issues noted in this document were discussed and addressed.  A limited physical exam was performed with this format.  Please refer to the patient's chart for her consent to telehealth for [facility].  The patient was identified using 2 identifiers.  Patient Location: Home Provider Location: Office/Clinic  Date:  10/29/2020   ID:  [name], DOB August 28, 1956, MRN [id number]  PCP:  Glendale Chard, MD  Cardiologist:  Buford Dresser, MD  Referring MD: Glendale Chard, MD   CC: follow up  History of Present Illness:    [name] is a 65 y.o. female with a hx of brain aneurysm, aortic atherosclerosis, hypertension, hyperlipidemia, former tobacco use, COPD, prior ICH who is seen for follow up. I initially saw her 04/20/20 as a new consult at the request of Glendale Chard, MD for the evaluation and management of dyspnea on exertion.  Cardiovascular risk factors: Prior clinical ASCVD: ICH/CVA Comorbid conditions: endorses hypertension, hyperlipidemia. Denies diabetes, chronic kidney disease Metabolic syndrome/Obesity: no, BMI 28 Chronic inflammatory conditions: denies Tobacco use history: former, from age 22 until the day of her stroke in 2011. None since. Was 2 ppd at her peak. Family history: father has stents, has had open heart surgery. Has cancer now. Mother has a heart murmur. No heart surgeries that she knows of. Has a brother, has sleep apnea. No known heart issues. Prior cardiac testing and/or incidental  findings on other testing (ie coronary calcium): echo 2015. CT noncon lung 04/19/19 showed aortic atherosclerosis and coronary calcification. On noncontrast CT, has prominent calcium in distal left main/proximal LAD.  Today: Changed to virtual visit today as she has URI. Home covid test negative.   We reviewed her recent CT and MRI results. She is concerned as these noted plaque and calcification throughout her vascular system.  We discussed recommendations and guidelines for secondary prevention. Also discussed indications for cardiac angiography.  No chest pain. Has rare arm tingling/numbness. Has chronic COPD, baseline shortness of breath. No PND, orthopnea, syncope.  Past Medical History:  Diagnosis Date  . Abnormal gait   . Amnesia   . Anemia    hx  . Aneurysm ([place])   . Anxiety   . Asthma   . Atrophic vaginitis   . Benign hypertensive heart disease   . Cardiomegaly   . Chest pain   . COPD (chronic obstructive pulmonary disease) ([place])     - PFTs  01/24/06 FEV1 69% ratio 68% diffusing capacity 57% with no improvement after B2   - PFT's 10/1/ 09 FEV1 77   ratio  76    dlco                   45            no resp to B2    - Nl alpha one antitripsin level 10/09  . Cough   . Depression   . Difficulty speaking   . Disorder of nasal cavity   . Dyspnea   . Enthesopathy of elbow region   . GERD (  gastroesophageal reflux disease)   . Hammer toe   . Headache(784.0)   . Hemiparesis affecting dominant side as late effect of cerebrovascular accident ([place])   . History of kidney stones   . Hyperlipidemia   . Hypertension   . Hypothyroidism   . Increased urinary frequency   . Low compliance bladder   . Macular degeneration of both eyes   . Malaise and fatigue   . Nonruptured cerebral aneurysm   . Obesity   . Pneumonia    hx  . Pure hypercholesterolemia   . Recurrent major depressive episodes ([place])   . Seizure disorder, secondary ([place])    2-3 focal siezures monthly  . Seizures  ([place])   . Short-term memory loss    since stroke  . Skin sensation disturbance   . Stroke [facility]) 2011   rt sided weakness  . Swelling of limb   . TMJ (temporomandibular joint disorder)   . Urinary tract infectious disease   . Vertigo as late effect of stroke   . Vitamin D deficiency     Past Surgical History:  Procedure Laterality Date  . BACK SURGERY    . CEREBRAL ANGIOGRAM     x3  . COLONOSCOPY WITH PROPOFOL N/A 08/18/2016   Procedure: COLONOSCOPY WITH PROPOFOL;  Surgeon: Carol Ada, MD;  Location: [facility];  Service: Endoscopy;  Laterality: N/A;  . LAPAROSCOPIC HYSTERECTOMY    . MANDIBLE FRACTURE SURGERY    . RADIOLOGY WITH ANESTHESIA N/A 08/21/2013   Procedure: RADIOLOGY WITH ANESTHESIA;  Surgeon: Rob Hickman, MD;  Location: [facility];  Service: Radiology;  Laterality: N/A;  . stones     no surgery    Current Medications: Current Outpatient Medications on File Prior to Visit  Medication Sig  . albuterol (PROVENTIL) (2.5 MG/3ML) 0.083% nebulizer solution Take 3 mLs (2.5 mg total) by nebulization every 6 (six) hours.  Marland Kitchen albuterol (VENTOLIN HFA) 108 (90 Base) MCG/ACT inhaler Inhale 2 puffs into the lungs every 6 (six) hours as needed for wheezing or shortness of breath.  . ALPRAZolam (XANAX) 0.25 MG tablet TAKE 1 TABLET TWICE A DAY AS NEEDED  . aspirin EC 81 MG tablet Take 81 mg by mouth daily.  . Biotin 1 MG CAPS Take by mouth.  . Cholecalciferol (VITAMIN D-3) 1000 units CAPS Take 1,000 Units by mouth daily.  . citalopram (CELEXA) 40 MG tablet Take 1 tablet (40 mg total) by mouth at bedtime.  . clopidogrel (PLAVIX) 75 MG tablet TAKE 1 TABLET DAILY  . CRANBERRY PO Take 2 tablets by mouth 2 (two) times daily.  . cyclobenzaprine (FLEXERIL) 10 MG tablet TAKE 1 TABLET AT BEDTIME AS NEEDED  . diclofenac Sodium (VOLTAREN) 1 % GEL Voltaren 1 % topical gel  APPLY 2 GRAM TO THE AFFECTED AREA(S) BY TOPICAL ROUTE 4 TIMES PER DAY  . fluticasone (FLONASE) 50 MCG/ACT nasal spray  Place 2 sprays into both nostrils daily as needed (allergies or post-nasl drip).   Marland Kitchen levETIRAcetam (KEPPRA) 500 MG tablet Take 1 tablet (500 mg total) by mouth every 12 (twelve) hours.  Marland Kitchen levothyroxine (SYNTHROID) 50 MCG tablet TAKE 1 TABLET ON MONDAY THROUGH WEDNESDAY  . levothyroxine (SYNTHROID) 75 MCG tablet TAKE 1 TABLET IN THE MORNING BEFORE BREAKFAST ON THURSDAY THROUGH SUNDAY (TAKE 50 MCG ON OTHER DAYS)  . Multiple Vitamins-Minerals (PRESERVISION/LUTEIN PO) Take by mouth.   . NON FORMULARY preser vision 2 tabs daily  . NONFORMULARY OR COMPOUNDED ITEM Apply 240 mg topically daily as needed.  Marland Kitchen  Omega-3 Fatty Acids (FISH OIL) 1000 MG CAPS Take 2,000 mg by mouth 2 (two) times daily.   Marland Kitchen telmisartan (MICARDIS) 20 MG tablet TAKE 1 TABLET DAILY  . vitamin C (ASCORBIC ACID) 500 MG tablet Take 500 mg by mouth daily.  . vitamin E 1000 UNIT capsule Take 1,000 Units by mouth at bedtime.   No current facility-administered medications on file prior to visit.     Allergies:   Phenytoin   Social History   Tobacco Use  . Smoking status: Former Smoker    Packs/day: 2.00    Years: 30.00    Pack years: 60.00    Types: Cigarettes    Quit date: 11/27/2009    Years since quitting: 10.9  . Smokeless tobacco: Never Used  . Tobacco comment: smoked 2.5 ppd x 5 years, 1ppd x 20+ years  Vaping Use  . Vaping Use: Never used  Substance Use Topics  . Alcohol use: No    Alcohol/week: 0.0 standard drinks    Comment: patient quit alcohol use 2005.   . Drug use: No    Family History: family history includes Cancer in her father; Heart attack in her father; Heart disease in her father and mother; Lung cancer in an other family member. There is no history of Breast cancer.  ROS:   Please see the history of present illness.  Additional pertinent ROS otherwise unremarkable.  EKGs/Labs/Other Studies Reviewed:    The following studies were reviewed today: Lexiscan myoview 04/29/20  The left ventricular  ejection fraction is hyperdynamic (>65%).  Nuclear stress EF: 73%.  The TID is increased at 1.34 but visually there does not appear to be transient ischemic dilatation.  There was no ST segment deviation noted during stress.  The perfusion study is normal.  This is a low risk study.  Echo 01/30/14 Left ventricle: The cavity size was normal. Wall thickness was  normal. Systolic function was normal. The estimated ejection  fraction was in the range of 60% to 65%. Wall motion was normal;  there were no regional wall motion abnormalities. Doppler  parameters are consistent with abnormal left ventricular  relaxation (grade 1 diastolic dysfunction). The E/e&' ratio is <8,  suggesting normal LV filling pressure.  - Left atrium: The atrium was normal in size.  - Pericardium, extracardiac: There is a heterogenous mass inferior  to the RV which extends toward the apex and compresses the RV -  this is similar in appearance to liver and may represent  hepatomegaly or perhaps mass - consider dedicated  abdominal/thoracic imaging. There is the appearance of possible  mass invading the IVC in image 71. There was no pericardial  effusion.   Impressions:   - No evidence for cardiomegaly. Heterogenous mass comressing the RV  and possibly invading the IVC seen in subcostal images. Recommend  dedicated thoracoabdominal imaging.   CT angio chest/abdomen 02/14/14 IMPRESSION:  1. No aortic dissection. No aneurysm.  2. No acute findings in the chest abdomen.  3. No abdominal mass.   EKG:  EKG is personally reviewed.  The ekg ordered 04/20/20 demonstrates NSR at 87 bpm  Recent Labs: 09/16/2020: ALT 21; BUN 11; Creatinine, Ser 0.69; Hemoglobin 13.1; Platelets 225; Potassium 4.9; Sodium 144; TSH 2.610  Recent Lipid Panel    Component Value Date/Time   CHOL 153 09/16/2020 1429   TRIG 95 09/16/2020 1429   HDL 71 09/16/2020 1429   CHOLHDL 2.2 09/16/2020 1429   CHOLHDL  2.8 11/23/2009 0435   VLDL 15  11/23/2009 0435   LDLCALC 65 09/16/2020 1429    Physical Exam:    VS:  Ht 5\' 2"  (1.575 m)   Wt 160 lb (72.6 kg)   BMI 29.26 kg/m     Wt Readings from Last 3 Encounters:  10/29/20 160 lb (72.6 kg)  09/29/20 160 lb 6.4 oz (72.8 kg)  09/29/20 161 lb 6.4 oz (73.2 kg)    VITAL SIGNS:  reviewed GEN:  no acute distress EYES:  sclerae anicteric, EOMI - Extraocular Movements Intact RESPIRATORY:  normal respiratory effort, symmetric expansion CARDIOVASCULAR:  no visible JVD SKIN:  no rash, lesions or ulcers. MUSCULOSKELETAL:  no obvious deformities. NEURO:  alert and oriented x 3, no obvious focal deficit PSYCH:  normal affect  ASSESSMENT:    1. History of CVA (cerebrovascular accident)   2. Aortic atherosclerosis ([place])   3. Coronary artery calcification seen on CT scan   4. Hyperlipidemia, unspecified hyperlipidemia type   5. Medication management    PLAN:    History of CVA, with history of aneurysms Coronary calcium on CT -secondary prevention discussed at length today -continue aspirin, clopidogrel per neurology -we discussed recommendations re: rosuvastatin dosing. Her LDL meets criteria (LDL <70), but she is only on 10 mg of rosuvastatin. She would like to be more aggressive, so we will increase rosuvastatin to 20 mg today and follow up on lipids  Hypertension -continue telmisartan  Hyperlipidemia -continue rosuvastatin, increasing to 20 mg daily as above -LDL goal <70  Cardiac risk counseling and prevention recommendations: -recommend heart healthy/Mediterranean diet, with whole grains, fruits, vegetable, fish, lean meats, nuts, and olive oil. Limit salt. -recommend moderate walking, 3-5 times/week for 30-50 minutes each session. Aim for at least 150 minutes.week. Goal should be pace of 3 miles/hours, or walking 1.5 miles in 30 minutes -recommend avoidance of tobacco products. Avoid excess alcohol.  Plan for follow up: 6 mos for visit,  labs in 2-3 months  Today, I have spent 15 minutes with the patient with telehealth technology discussing the above problems.  Additional time spent in chart review, documentation, and communication.  Buford Dresser, MD, PhD [facility]    Medication Adjustments/Labs and Tests Ordered: Current medicines are reviewed at length with the patient today.  Concerns regarding medicines are outlined above.  Orders Placed This Encounter  Procedures  . Lipid panel   Meds ordered this encounter  Medications  . rosuvastatin (CRESTOR) 20 MG tablet    Sig: Take 1 tablet (20 mg total) by mouth daily.    Dispense:  90 tablet    Refill:  3    Replaces 10 mg dose    Patient Instructions  Medication Instructions:  Increase Rosuvastatin to 20 mg daily  *If you need a refill on your cardiac medications before your next appointment, please call your pharmacy*   Lab Work: Your physician recommends that you return for lab work in 3 months (Fasting lipid).  If you have labs (blood work) drawn today and your tests are completely normal, you will receive your results only by: Marland Kitchen MyChart Message (if you have MyChart) OR . A paper copy in the mail If you have any lab test that is abnormal or we need to change your treatment, we will call you to review the results.   Testing/Procedures: None   Follow-Up: At [facility], you and your health needs are our priority.  As part of our continuing mission to provide you with exceptional heart care, we have created  designated Provider Care Teams.  These Care Teams include your primary Cardiologist (physician) and Advanced Practice Providers (APPs -  Physician Assistants and Nurse Practitioners) who all work together to provide you with the care you need, when you need it.  We recommend signing up for the patient portal called "MyChart".  Sign up information is provided on this After Visit Summary.  MyChart is used to connect with  patients for Virtual Visits (Telemedicine).  Patients are able to view lab/test results, encounter notes, upcoming appointments, etc.  Non-urgent messages can be sent to your provider as well.   To learn more about what you can do with MyChart, go to [email or web address].    Your next appointment:   6 month(s)  The format for your next appointment:   In Person  Provider:   Buford Dresser, MD       Signed, Buford Dresser, MD PhD 10/29/2020  [place]

## 2020-10-29 NOTE — Patient Instructions (Signed)
Medication Instructions:  Increase Rosuvastatin to 20 mg daily  *If you need a refill on your cardiac medications before your next appointment, please call your pharmacy*   Lab Work: Your physician recommends that you return for lab work in 3 months (Fasting lipid).  If you have labs (blood work) drawn today and your tests are completely normal, you will receive your results only by: Marland Kitchen MyChart Message (if you have MyChart) OR . A paper copy in the mail If you have any lab test that is abnormal or we need to change your treatment, we will call you to review the results.   Testing/Procedures: None   Follow-Up: At [facility], you and your health needs are our priority.  As part of our continuing mission to provide you with exceptional heart care, we have created designated Provider Care Teams.  These Care Teams include your primary Cardiologist (physician) and Advanced Practice Providers (APPs -  Physician Assistants and Nurse Practitioners) who all work together to provide you with the care you need, when you need it.  We recommend signing up for the patient portal called "MyChart".  Sign up information is provided on this After Visit Summary.  MyChart is used to connect with patients for Virtual Visits (Telemedicine).  Patients are able to view lab/test results, encounter notes, upcoming appointments, etc.  Non-urgent messages can be sent to your provider as well.   To learn more about what you can do with MyChart, go to [email or web address].    Your next appointment:   6 month(s)  The format for your next appointment:   In Person  Provider:   Buford Dresser, MD

## 2020-11-04 ENCOUNTER — Telehealth (INDEPENDENT_AMBULATORY_CARE_PROVIDER_SITE_OTHER): Payer: Medicare Other | Admitting: Nurse Practitioner

## 2020-11-04 ENCOUNTER — Encounter: Payer: Self-pay | Admitting: Nurse Practitioner

## 2020-11-04 VITALS — BP 122/79 | Temp 97.8°F | Ht 62.0 in | Wt 160.0 lb

## 2020-11-04 DIAGNOSIS — R059 Cough, unspecified: Secondary | ICD-10-CM | POA: Diagnosis not present

## 2020-11-04 DIAGNOSIS — Z20822 Contact with and (suspected) exposure to covid-19: Secondary | ICD-10-CM

## 2020-11-04 MED ORDER — BENZONATATE 100 MG PO CAPS: 100.0000 mg | ORAL_CAPSULE | Freq: Three times a day (TID) | ORAL | 1 refills | Status: DC | PRN

## 2020-11-04 MED ORDER — AZITHROMYCIN 250 MG PO TABS: ORAL_TABLET | ORAL | 0 refills | Status: AC

## 2020-11-04 NOTE — Patient Instructions (Signed)

## 2020-11-04 NOTE — Progress Notes (Signed)
Virtual Visit via Telemedicine    This visit type was conducted due to national recommendations for restrictions regarding the COVID-19 Pandemic (e.g. social distancing) in an effort to limit this patient's exposure and mitigate transmission in our community.  Due to her co-morbid illnesses, this patient is at least at moderate risk for complications without adequate follow up.  This format is felt to be most appropriate for this patient at this time.  All issues noted in this document were discussed and addressed.  A limited physical exam was performed with this format.    This visit type was conducted due to national recommendations for restrictions regarding the COVID-19 Pandemic (e.g. social distancing) in an effort to limit this patient's exposure and mitigate transmission in our community.  Patients identity confirmed using two different identifiers.  This format is felt to be most appropriate for this patient at this time.  All issues noted in this document were discussed and addressed.  No physical exam was performed (except for noted visual exam findings with Video Visits).    Date:  11/04/2020   ID:  [name], DOB [birth year], MRN [id number]  Patient Location:  Home   Provider location:   Office    Chief Complaint:  Cough; possible exposure to covid   History of Present Illness:    [name] is a 65 y.o. female who presents via video conferencing for a telehealth visit today.    The patient  have symptoms concerning for COVID-19 infection Cough, no fever and shortness of breath at times.   She has a bad cough since the 10/25/20. She does not have a fever. She feels run down and tired. She did take a Covid test on 2/21 home test and it was negative. She has had COVID vaccine and the booster. She is spitting out mucus it is green. Thick consistency. She has SOB at times.  She stays mostly at home. Her husband is not sick. She does have a long health history and  history of pneumonia in the past.     Past Medical History:  Diagnosis Date  . Abnormal gait   . Amnesia   . Anemia    hx  . Aneurysm ([place])   . Anxiety   . Asthma   . Atrophic vaginitis   . Benign hypertensive heart disease   . Cardiomegaly   . Chest pain   . COPD (chronic obstructive pulmonary disease) ([place])     - PFTs  01/24/06 FEV1 69% ratio 68% diffusing capacity 57% with no improvement after B2   - PFT's 10/1/ 09 FEV1 77   ratio  76    dlco                   45            no resp to B2    - Nl alpha one antitripsin level 10/09  . Cough   . Depression   . Difficulty speaking   . Disorder of nasal cavity   . Dyspnea   . Enthesopathy of elbow region   . GERD (gastroesophageal reflux disease)   . Hammer toe   . Headache(784.0)   . Hemiparesis affecting dominant side as late effect of cerebrovascular accident ([place])   . History of kidney stones   . Hyperlipidemia   . Hypertension   . Hypothyroidism   . Increased urinary frequency   . Low compliance bladder   . Macular degeneration of both  eyes   . Malaise and fatigue   . Nonruptured cerebral aneurysm   . Obesity   . Pneumonia    hx  . Pure hypercholesterolemia   . Recurrent major depressive episodes ([place])   . Seizure disorder, secondary ([place])    2-3 focal siezures monthly  . Seizures ([place])   . Short-term memory loss    since stroke  . Skin sensation disturbance   . Stroke [facility]) 2011   rt sided weakness  . Swelling of limb   . TMJ (temporomandibular joint disorder)   . Urinary tract infectious disease   . Vertigo as late effect of stroke   . Vitamin D deficiency    Past Surgical History:  Procedure Laterality Date  . BACK SURGERY    . CEREBRAL ANGIOGRAM     x3  . COLONOSCOPY WITH PROPOFOL N/A 08/18/2016   Procedure: COLONOSCOPY WITH PROPOFOL;  Surgeon: Carol Ada, MD;  [facility]: [facility];  Service: Endoscopy;  Laterality: N/A;  . LAPAROSCOPIC HYSTERECTOMY    . MANDIBLE FRACTURE SURGERY    .  RADIOLOGY WITH ANESTHESIA N/A 08/21/2013   Procedure: RADIOLOGY WITH ANESTHESIA;  Surgeon: Rob Hickman, MD;  Location: [place];  Service: Radiology;  Laterality: N/A;  . stones     no surgery     No outpatient medications have been marked as taking for the 11/04/20 encounter (Video Visit) with Bary Castilla, NP.     Allergies:   Phenytoin   Social History   Tobacco Use  . Smoking status: Former Smoker    Packs/day: 2.00    Years: 30.00    Pack years: 60.00    Types: Cigarettes    Quit date: 11/27/2009    Years since quitting: 10.9  . Smokeless tobacco: Never Used  . Tobacco comment: smoked 2.5 ppd x 5 years, 1ppd x 20+ years  Vaping Use  . Vaping Use: Never used  Substance Use Topics  . Alcohol use: No    Alcohol/week: 0.0 standard drinks    Comment: patient quit alcohol use 2005.   . Drug use: No     Family Hx: The patient's family history includes Cancer in her father; Heart attack in her father; Heart disease in her father and mother; Lung cancer in an other family member. There is no history of Breast cancer.  ROS:   Please see the history of present illness.    Review of Systems  Constitutional: Positive for malaise/fatigue. Negative for chills and fever.  HENT: Negative for ear pain, sinus pain and sore throat.   Eyes: Negative for pain.  Respiratory: Positive for cough, sputum production and shortness of breath.   Cardiovascular: Negative for chest pain.  Gastrointestinal: Negative for abdominal pain, diarrhea, nausea and vomiting.  Musculoskeletal: Positive for myalgias.  Neurological: Negative for headaches.    All other systems reviewed and are negative.   Labs/Other Tests and Data Reviewed:    Recent Labs: 09/16/2020: ALT 21; BUN 11; Creatinine, Ser 0.69; Hemoglobin 13.1; Platelets 225; Potassium 4.9; Sodium 144; TSH 2.610   Recent Lipid Panel Lab Results  Component Value Date/Time   CHOL 153 09/16/2020 02:29 PM   TRIG 95 09/16/2020 02:29  PM   HDL 71 09/16/2020 02:29 PM   CHOLHDL 2.2 09/16/2020 02:29 PM   CHOLHDL 2.8 11/23/2009 04:35 AM   LDLCALC 65 09/16/2020 02:29 PM    Wt Readings from Last 3 Encounters:  11/04/20 160 lb (72.6 kg)  10/29/20 160 lb (72.6 kg)  09/29/20 160  lb 6.4 oz (72.8 kg)     Exam:    Vital Signs:  BP 122/79   Temp 97.8 F (36.6 C) (Oral)   Ht 5\' 2"  (1.575 m)   Wt 160 lb (72.6 kg)   BMI 29.26 kg/m     Physical Exam Vitals and nursing note reviewed.  Constitutional:      Appearance: Normal appearance.  HENT:     Head: Normocephalic and atraumatic.  Pulmonary:     Effort: Pulmonary effort is normal.  Neurological:     Mental Status: She is alert and oriented to person, place, and time.  Psychiatric:        Mood and Affect: Affect normal.     ASSESSMENT & PLAN:     1. Cough -Will go ahead and treat her with antx due to long history of health problems and comorbidities  -Z-pak sent to pharmacy  -Tessalon pearls TID PRN sent to pharmacy  -Advised patient to use OTC delysm  -Educated patient to take vitamin C,D and zinc  -Drink plenty of fluids    2. Contact with and suspected exposure to COVID 69  -Educated patient to go get a COVID test -Educated patient of she experiences any increase in SOB or any worsening of symptoms to go to the emergency room.  -Educated patient to monitor her oxygen levels. -Educated patient to stay well hydrated and take vitamin C,D and zinc.    Follow up if symptoms do not improve or get better.   COVID-19 Education: The signs and symptoms of COVID-19 were discussed with the patient and how to seek care for testing (follow up with PCP or arrange E-visit).  The importance of social distancing was discussed today.  Patient Risk:   After full review of this patients clinical status, I feel that they are at least moderate risk at this time.  Time:   Today, I have spent 20 minutes/ seconds with the patient with telehealth technology discussing  above diagnoses.     Medication Adjustments/Labs and Tests Ordered: Current medicines are reviewed at length with the patient today.  Concerns regarding medicines are outlined above.   Tests Ordered: No orders of the defined types were placed in this encounter.   Medication Changes: No orders of the defined types were placed in this encounter.   Disposition:  Follow up if symptoms do not improve or get worse   Signed, Bary Castilla, NP

## 2020-11-05 DIAGNOSIS — Z20822 Contact with and (suspected) exposure to covid-19: Secondary | ICD-10-CM | POA: Diagnosis not present

## 2020-11-09 ENCOUNTER — Other Ambulatory Visit: Payer: Self-pay

## 2020-11-09 ENCOUNTER — Ambulatory Visit: Payer: Medicare Other | Attending: Adult Health | Admitting: Physical Therapy

## 2020-11-09 ENCOUNTER — Encounter: Payer: Self-pay | Admitting: Physical Therapy

## 2020-11-09 DIAGNOSIS — R293 Abnormal posture: Secondary | ICD-10-CM | POA: Diagnosis not present

## 2020-11-09 DIAGNOSIS — M542 Cervicalgia: Secondary | ICD-10-CM | POA: Diagnosis not present

## 2020-11-09 DIAGNOSIS — M62838 Other muscle spasm: Secondary | ICD-10-CM | POA: Diagnosis not present

## 2020-11-09 NOTE — Therapy (Addendum)
[place], [place], [address] Phone: [phone]   Fax:  [phone]  Physical Therapy Treatment  Patient Details  Name: [name] MRN: [id number] Date of Birth: [birth year] Referring Provider (PT): Frann Rider, NP   Encounter Date: 11/09/2020   PT End of Session - 11/09/20 1519    Visit Number 2    Number of Visits 13    Date for PT Re-Evaluation 11/27/20    Authorization Type MCR: Kx mod at15th visit, FOTO at 6th and 10th visit.    PT Start Time 1100    PT Stop Time 1143    PT Time Calculation (min) 43 min    Activity Tolerance Patient tolerated treatment well    Behavior During Therapy WFL for tasks assessed/performed           Past Medical History:  Diagnosis Date  . Abnormal gait   . Amnesia   . Anemia    hx  . Aneurysm ([place])   . Anxiety   . Asthma   . Atrophic vaginitis   . Benign hypertensive heart disease   . Cardiomegaly   . Chest pain   . COPD (chronic obstructive pulmonary disease) ([place])     - PFTs  01/24/06 FEV1 69% ratio 68% diffusing capacity 57% with no improvement after B2   - PFT's 10/1/ 09 FEV1 77   ratio  76    dlco                   45            no resp to B2    - Nl alpha one antitripsin level 10/09  . Cough   . Depression   . Difficulty speaking   . Disorder of nasal cavity   . Dyspnea   . Enthesopathy of elbow region   . GERD (gastroesophageal reflux disease)   . Hammer toe   . Headache(784.0)   . Hemiparesis affecting dominant side as late effect of cerebrovascular accident ([place])   . History of kidney stones   . Hyperlipidemia   . Hypertension   . Hypothyroidism   . Increased urinary frequency   . Low compliance bladder   . Macular degeneration of both eyes   . Malaise and fatigue   . Nonruptured cerebral aneurysm   . Obesity   . Pneumonia    hx  . Pure hypercholesterolemia   . Recurrent major depressive episodes ([place])   . Seizure disorder,  secondary ([place])    2-3 focal siezures monthly  . Seizures ([place])   . Short-term memory loss    since stroke  . Skin sensation disturbance   . Stroke [facility]) 2011   rt sided weakness  . Swelling of limb   . TMJ (temporomandibular joint disorder)   . Urinary tract infectious disease   . Vertigo as late effect of stroke   . Vitamin D deficiency     Past Surgical History:  Procedure Laterality Date  . BACK SURGERY    . CEREBRAL ANGIOGRAM     x3  . COLONOSCOPY WITH PROPOFOL N/A 08/18/2016   Procedure: COLONOSCOPY WITH PROPOFOL;  Surgeon: Carol Ada, MD;  Location: [facility];  Service: Endoscopy;  Laterality: N/A;  . LAPAROSCOPIC HYSTERECTOMY    . MANDIBLE FRACTURE SURGERY    . RADIOLOGY WITH ANESTHESIA N/A 08/21/2013   Procedure: RADIOLOGY WITH ANESTHESIA;  Surgeon: Rob Hickman, MD;  Location: [facility];  Service: Radiology;  Laterality: N/A;  . stones     no surgery    There were no vitals filed for this visit.   Subjective Assessment - 11/09/20 1512    Subjective Patient reports her headache comes and goes. She continues to have neck stiffness. She has been working on her stretches and exercises    Pertinent History significant PMHx    Limitations Walking;Standing    How long can you sit comfortably? unlimited    How long can you stand comfortably? 10 min with [facility]    How long can you walk comfortably? 15- 20 min with [facility]    Diagnostic tests 10/14/2020 MRI   IMPRESSION:  Stenting of distal left vertebral artery which is patent. Small left  PICA aneurysm unchanged     Unfortunately, despite 2 attempts, the left pericallosal aneurysm is  not fully imaged. If desired, the patient could return for  additional MRA imaging.    Patient Stated Goals stop headaches, reducce stiffnes    Currently in Pain? Yes    Pain Score 3     Pain Location Head    Pain Orientation Right    Pain Descriptors / Indicators Aching    Pain Type Chronic pain    Pain Radiating Towards mild heachache  across her forehead    Pain Onset More than a month ago    Pain Frequency Constant    Aggravating Factors  looking left and right    Effect of Pain on Daily Activities limited ROM                             [facility] Adult PT Treatment/Exercise - 11/09/20 0001      Neck Exercises: Seated   Neck Retraction 5 secs;5 reps    Other Seated Exercise scapular retractoin 2x10 standing 5 seconds   verbal cues to avoid hiking shoulder up     Manual Therapy   Manual Therapy Soft tissue mobilization;Manual Traction    Soft tissue mobilization to upper traps and cervical spine    Manual Traction gentle manual traction      Neck Exercises: Stretches   Upper Trapezius Stretch 2 reps;Left;Right;30 seconds    Levator Stretch Left;Right;30 seconds;2 reps            Trigger Point Dry Needling - 11/09/20 0001    Consent Given? Yes    Education Handout Provided Yes    Muscles Treated Head and Neck Suboccipitals    Dry Needling Comments 3 spots to left upper trap 2nd time not twitch 1st and 3rd good twtich;  1 spot to bilateral sub-occipital;    Upper Trapezius Response Twitch reponse elicited;Palpable increased muscle length    Suboccipitals Response Twitch response elicited;Palpable increased muscle length                PT Education - 11/09/20 1519    Education Details benefits and risks of TPDN    Person(s) Educated Patient    Methods Explanation;Tactile cues;Verbal cues;Demonstration    Comprehension Verbalized understanding;Returned demonstration;Verbal cues required;Tactile cues required            PT Short Term Goals - 10/16/20 1120      PT SHORT TERM GOAL #1   Title pt to be IND with initial HEP    Time 3    Period Weeks    Status New    Target Date 11/06/20      PT SHORT TERM GOAL #2  Title pt to verbalize / demo efficient posture to reduce muscle tension and neck pain/ headaches.    Time 3    Period Weeks    Status New    Target Date 11/06/20              PT Long Term Goals - 10/16/20 1316      PT LONG TERM GOAL #1   Title pt to increase cerivcal rotaiton by >/= 5 degrees and maintain all other functional ranges with </=2/10 max pain for ADLs    Baseline -    Time 6    Period Weeks    Status New    Target Date 11/27/20      PT LONG TERM GOAL #2   Title increase bil gross shoulder strength to >/= 4+/5 to promote postureal efficiency    Baseline -    Time 6    Period Weeks    Status New    Target Date 11/27/20      PT LONG TERM GOAL #3   Title increase neck FOTO to >/= 55% and craniofacial to >/= 47% to demo improvement in functoin    Baseline -    Time 6    Period Weeks    Status New    Target Date 11/27/20      PT LONG TERM GOAL #4   Title pt to report HA frequency to </= 1 every 2 weeks for improvement in condition and QOL    Baseline -    Time 6    Period Weeks    Status New    Target Date 11/27/20      PT LONG TERM GOAL #5   Title pt to be IND with all HEP and is able to maintain and progress current LOF IND    Baseline -    Time 6    Period Weeks    Status New    Target Date 11/27/20                 Plan - 11/09/20 1521    Clinical Impression Statement Patient tolerated treatment well. Shereported no heacache after needling. She had a great tittch in her upper trap and her sub-occipitals. Therapy reviewed stretches and exercises to perfrom at home. We will add resistance to posterior chanin strengthening next visit. She reports she is having decreased fucntion of her right hand. She was advised she may need to go back to OT for a follow up. Sheis having difficulty using her right hand to eat.    Personal Factors and Comorbidities Comorbidity 3+;Past/Current Experience    Comorbidities hx of 2 strokes, anxiety, depression    Stability/Clinical Decision Making Evolving/Moderate complexity    Clinical Decision Making Moderate    Rehab Potential Good    PT Frequency 2x / week    PT Duration  6 weeks    PT Treatment/Interventions ADLs/Self Care Home Management;Cryotherapy;Electrical Stimulation;Iontophoresis 4mg /ml Dexamethasone;Moist Heat;Ultrasound;Neuromuscular re-education;Therapeutic exercise;Therapeutic activities;Functional mobility training;Patient/family education;Manual techniques;Passive range of motion;Dry needling;Taping    PT Next Visit Plan review/ update HEP PRN, response DN and continue for bil upper trap. posture education, posterior shoulder strengthening,    PT Home Exercise Plan [id number] - seated chin tuck, upper trap stretch, levator scapuale stretch, scapular retraction    Consulted and Agree with Plan of Care Patient           Patient will benefit from skilled therapeutic intervention in order to improve the following deficits and impairments:  Abnormal  gait,Decreased strength,Improper body mechanics,Increased muscle spasms,Pain,Postural dysfunction,Decreased activity tolerance,Decreased endurance,Decreased range of motion  Visit Diagnosis: Cervicalgia  Other muscle spasm  Abnormal posture     Problem List Patient Active Problem List   Diagnosis Date Noted  . Family history of heart disease 06/14/2020  . History of CVA (cerebrovascular accident) 06/14/2020  . Exudative age-related macular degeneration of right eye with active choroidal neovascularization ([place]) 09/11/2019  . Nocturnal hypoxemia 09/11/2019  . Non-restorative sleep 06/13/2019  . Benign myoclonus of drowsiness 06/13/2019  . Excessive daytime sleepiness 06/13/2019  . 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  . Hemiparesis affecting right side as late effect of cerebrovascular accident ([place]) 06/13/2019  . Macular degeneration of right eye 06/11/2019  . Class 1 obesity due to excess calories with serious comorbidity and body mass index (BMI) of 31.0 to 31.9 in adult 06/11/2019  . Moderate episode of recurrent major depressive disorder ([place]) 06/11/2019  . Spastic  hemiparesis ([place]) 03/25/2019  . Prediabetes 01/01/2019  . Acquired hypothyroidism 01/01/2019  . Hypertension 01/01/2019  . Aortic atherosclerosis ([place]) 11/07/2018  . History of embolic stroke without residual deficits 03/29/2018  . Seizure disorder ([place]) 01/12/2017  . Common migraine 01/12/2017  . Aneurysm ([place])   . Brain aneurysm 08/21/2013  . Right foot drop 04/10/2013  . Intracerebral hemorrhage ([place]) 03/19/2013  . Convulsions ([place]) 03/19/2013  . Headache 03/19/2013  . Abnormality of gait 03/19/2013  . Edema 02/23/2011  . COUGH 06/10/2008  . HYPERLIPIDEMIA 04/25/2008  . TOBACCO ABUSE 04/25/2008  . ASTHMA 04/25/2008  . Chronic obstructive pulmonary disease ([place]) 04/25/2008    Carney Living PT DPT  11/09/2020, 3:31 PM  [facility] [address] [place], [place], [address] Phone: [phone]   Fax:  [phone]  Name: [name] MRN: [id number] Date of Birth: 24-Nov-1955

## 2020-11-10 ENCOUNTER — Ambulatory Visit: Payer: Medicare Other | Admitting: Physical Medicine & Rehabilitation

## 2020-11-11 ENCOUNTER — Other Ambulatory Visit: Payer: Self-pay

## 2020-11-11 ENCOUNTER — Ambulatory Visit: Payer: Medicare Other | Admitting: Physical Therapy

## 2020-11-11 DIAGNOSIS — R293 Abnormal posture: Secondary | ICD-10-CM | POA: Diagnosis not present

## 2020-11-11 DIAGNOSIS — M62838 Other muscle spasm: Secondary | ICD-10-CM | POA: Diagnosis not present

## 2020-11-11 DIAGNOSIS — M542 Cervicalgia: Secondary | ICD-10-CM

## 2020-11-12 ENCOUNTER — Encounter: Payer: Self-pay | Admitting: Physical Therapy

## 2020-11-12 DIAGNOSIS — L299 Pruritus, unspecified: Secondary | ICD-10-CM | POA: Diagnosis not present

## 2020-11-12 DIAGNOSIS — L649 Androgenic alopecia, unspecified: Secondary | ICD-10-CM | POA: Diagnosis not present

## 2020-11-12 NOTE — Therapy (Signed)
[place], [place], [address] Phone: [phone]   Fax:  [phone]  Physical Therapy Treatment  Patient Details  Name: [name] MRN: [id number] Date of Birth: [birth year] Referring Provider (PT): Frann Rider, NP   Encounter Date: 11/11/2020   PT End of Session - 11/12/20 0804    Visit Number 3    Number of Visits 13    Date for PT Re-Evaluation 11/27/20    Authorization Type MCR: Kx mod at15th visit, FOTO at 6th and 10th visit.    PT Start Time 1015    PT Stop Time 1056    PT Time Calculation (min) 41 min    Activity Tolerance Patient tolerated treatment well    Behavior During Therapy WFL for tasks assessed/performed           Past Medical History:  Diagnosis Date  . Abnormal gait   . Amnesia   . Anemia    hx  . Aneurysm ([place])   . Anxiety   . Asthma   . Atrophic vaginitis   . Benign hypertensive heart disease   . Cardiomegaly   . Chest pain   . COPD (chronic obstructive pulmonary disease) ([place])     - PFTs  01/24/06 FEV1 69% ratio 68% diffusing capacity 57% with no improvement after B2   - PFT's 10/1/ 09 FEV1 77   ratio  76    dlco                   45            no resp to B2    - Nl alpha one antitripsin level 10/09  . Cough   . Depression   . Difficulty speaking   . Disorder of nasal cavity   . Dyspnea   . Enthesopathy of elbow region   . GERD (gastroesophageal reflux disease)   . Hammer toe   . Headache(784.0)   . Hemiparesis affecting dominant side as late effect of cerebrovascular accident ([place])   . History of kidney stones   . Hyperlipidemia   . Hypertension   . Hypothyroidism   . Increased urinary frequency   . Low compliance bladder   . Macular degeneration of both eyes   . Malaise and fatigue   . Nonruptured cerebral aneurysm   . Obesity   . Pneumonia    hx  . Pure hypercholesterolemia   . Recurrent major depressive episodes ([place])   . Seizure disorder,  secondary ([place])    2-3 focal siezures monthly  . Seizures ([place])   . Short-term memory loss    since stroke  . Skin sensation disturbance   . Stroke [facility]) 2011   rt sided weakness  . Swelling of limb   . TMJ (temporomandibular joint disorder)   . Urinary tract infectious disease   . Vertigo as late effect of stroke   . Vitamin D deficiency     Past Surgical History:  Procedure Laterality Date  . BACK SURGERY    . CEREBRAL ANGIOGRAM     x3  . COLONOSCOPY WITH PROPOFOL N/A 08/18/2016   Procedure: COLONOSCOPY WITH PROPOFOL;  Surgeon: Carol Ada, MD;  [facility];  Service: Endoscopy;  Laterality: N/A;  . LAPAROSCOPIC HYSTERECTOMY    . MANDIBLE FRACTURE SURGERY    . RADIOLOGY WITH ANESTHESIA N/A 08/21/2013   Procedure: RADIOLOGY WITH ANESTHESIA;  Surgeon: Rob Hickman, MD;  Location: [facility];  Service: Radiology;  Laterality: N/A;  . stones     no surgery    There were no vitals filed for this visit.   Subjective Assessment - 11/12/20 0802    Subjective Patient reports she flet alittle better after her needling session. She did not get sore. She has a mild heache today but it has been tolerable.    Pertinent History significant PMHx    Limitations Walking;Standing    How long can you sit comfortably? unlimited    How long can you stand comfortably? 10 min with [facility]    How long can you walk comfortably? 15- 20 min with [facility]    Diagnostic tests 10/14/2020 MRI   IMPRESSION:  Stenting of distal left vertebral artery which is patent. Small left  PICA aneurysm unchanged     Unfortunately, despite 2 attempts, the left pericallosal aneurysm is  not fully imaged. If desired, the patient could return for  additional MRA imaging.    Patient Stated Goals stop headaches, reducce stiffnes    Currently in Pain? Yes    Pain Score 3     Pain Location Head    Pain Orientation Right    Pain Descriptors / Indicators Aching    Pain Type Chronic pain    Pain Radiating Towards mild  heache across her forehead    Pain Onset More than a month ago    Pain Frequency Constant    Aggravating Factors  looking left and right    Effect of Pain on Daily Activities limited ROM                             [facility] Adult PT Treatment/Exercise - 11/12/20 0001      Neck Exercises: Standing   Other Standing Exercises scap retraction 2x15; shoulder extension 2x15 yellow      Neck Exercises: Seated   Other Seated Exercise bilateral er 2x10 yellow; horizontal abduction 2x10 yellow      Manual Therapy   Manual Therapy Soft tissue mobilization;Manual Traction    Soft tissue mobilization to upper traps and cervical spine    Manual Traction gentle manual traction      Neck Exercises: Stretches   Upper Trapezius Stretch 2 reps;Left;Right;30 seconds    Levator Stretch Left;Right;30 seconds;2 reps                  PT Education - 11/12/20 0804    Education Details reviewed thera-ex    Person(s) Educated Patient    Methods Explanation    Comprehension Verbalized understanding;Returned demonstration;Verbal cues required;Tactile cues required            PT Short Term Goals - 10/16/20 1120      PT SHORT TERM GOAL #1   Title pt to be IND with initial HEP    Time 3    Period Weeks    Status New    Target Date 11/06/20      PT SHORT TERM GOAL #2   Title pt to verbalize / demo efficient posture to reduce muscle tension and neck pain/ headaches.    Time 3    Period Weeks    Status New    Target Date 11/06/20             PT Long Term Goals - 10/16/20 1316      PT LONG TERM GOAL #1   Title pt to increase cerivcal rotaiton by >/= 5 degrees and maintain all  other functional ranges with </=2/10 max pain for ADLs    Baseline -    Time 6    Period Weeks    Status New    Target Date 11/27/20      PT LONG TERM GOAL #2   Title increase bil gross shoulder strength to >/= 4+/5 to promote postureal efficiency    Baseline -    Time 6    Period Weeks     Status New    Target Date 11/27/20      PT LONG TERM GOAL #3   Title increase neck FOTO to >/= 55% and craniofacial to >/= 47% to demo improvement in functoin    Baseline -    Time 6    Period Weeks    Status New    Target Date 11/27/20      PT LONG TERM GOAL #4   Title pt to report HA frequency to </= 1 every 2 weeks for improvement in condition and QOL    Baseline -    Time 6    Period Weeks    Status New    Target Date 11/27/20      PT LONG TERM GOAL #5   Title pt to be IND with all HEP and is able to maintain and progress current LOF IND    Baseline -    Time 6    Period Weeks    Status New    Target Date 11/27/20                 Plan - 11/12/20 0806    Clinical Impression Statement Therapy focused on ther-ex today. She was given t-bands with handles on them so she could grip better with her right hand. She tolerated well. Therapy also perfromed manual therapy to her sub-occipitals anfd the upper traps. She was given a yewllow band for now. We will advance her as tolerated.    Personal Factors and Comorbidities Comorbidity 3+;Past/Current Experience    Comorbidities hx of 2 strokes, anxiety, depression    Stability/Clinical Decision Making Evolving/Moderate complexity    Clinical Decision Making Moderate    Rehab Potential Good    PT Frequency 2x / week    PT Duration 6 weeks    PT Treatment/Interventions ADLs/Self Care Home Management;Cryotherapy;Electrical Stimulation;Iontophoresis 4mg /ml Dexamethasone;Moist Heat;Ultrasound;Neuromuscular re-education;Therapeutic exercise;Therapeutic activities;Functional mobility training;Patient/family education;Manual techniques;Passive range of motion;Dry needling;Taping    PT Next Visit Plan review/ update HEP PRN, response DN and continue for bil upper trap. posture education, posterior shoulder strengthening,    PT Home Exercise Plan [id number] - seated chin tuck, upper trap stretch, levator scapuale stretch, scapular  retraction    Consulted and Agree with Plan of Care Patient           Patient will benefit from skilled therapeutic intervention in order to improve the following deficits and impairments:  Abnormal gait,Decreased strength,Improper body mechanics,Increased muscle spasms,Pain,Postural dysfunction,Decreased activity tolerance,Decreased endurance,Decreased range of motion  Visit Diagnosis: Cervicalgia  Other muscle spasm  Abnormal posture     Problem List Patient Active Problem List   Diagnosis Date Noted  . Family history of heart disease 06/14/2020  . History of CVA (cerebrovascular accident) 06/14/2020  . Exudative age-related macular degeneration of right eye with active choroidal neovascularization ([place]) 09/11/2019  . Nocturnal hypoxemia 09/11/2019  . Non-restorative sleep 06/13/2019  . Benign myoclonus of drowsiness 06/13/2019  . Excessive daytime sleepiness 06/13/2019  . 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  . Hemiparesis affecting  right side as late effect of cerebrovascular accident ([place]) 06/13/2019  . Macular degeneration of right eye 06/11/2019  . Class 1 obesity due to excess calories with serious comorbidity and body mass index (BMI) of 31.0 to 31.9 in adult 06/11/2019  . Moderate episode of recurrent major depressive disorder ([place]) 06/11/2019  . Spastic hemiparesis ([place]) 03/25/2019  . Prediabetes 01/01/2019  . Acquired hypothyroidism 01/01/2019  . Hypertension 01/01/2019  . Aortic atherosclerosis ([place]) 11/07/2018  . History of embolic stroke without residual deficits 03/29/2018  . Seizure disorder ([place]) 01/12/2017  . Common migraine 01/12/2017  . Aneurysm ([place])   . Brain aneurysm 08/21/2013  . Right foot drop 04/10/2013  . Intracerebral hemorrhage ([place]) 03/19/2013  . Convulsions ([place]) 03/19/2013  . Headache 03/19/2013  . Abnormality of gait 03/19/2013  . Edema 02/23/2011  . COUGH 06/10/2008  . HYPERLIPIDEMIA 04/25/2008  . TOBACCO ABUSE  04/25/2008  . ASTHMA 04/25/2008  . Chronic obstructive pulmonary disease ([place]) 04/25/2008    Carney Living PT DPT  11/12/2020, 8:14 AM  [facility] [address] [place], [place], [address] Phone: [phone]   Fax:  [phone]  Name: [name] MRN: [id number] Date of Birth: 03-20-1956

## 2020-11-16 ENCOUNTER — Other Ambulatory Visit: Payer: Self-pay

## 2020-11-16 ENCOUNTER — Ambulatory Visit: Payer: Medicare Other | Admitting: Physical Therapy

## 2020-11-16 DIAGNOSIS — R293 Abnormal posture: Secondary | ICD-10-CM

## 2020-11-16 DIAGNOSIS — M62838 Other muscle spasm: Secondary | ICD-10-CM

## 2020-11-16 DIAGNOSIS — M542 Cervicalgia: Secondary | ICD-10-CM | POA: Diagnosis not present

## 2020-11-16 NOTE — Therapy (Signed)
[place], [place], [address] Phone: [phone]   Fax:  [phone]  Physical Therapy Treatment  Patient Details  Name: [name] MRN: [id number] Date of Birth: November 27, 1955 Referring Provider (PT): Frann Rider, NP   Encounter Date: 11/16/2020   PT End of Session - 11/16/20 1552    Visit Number 4    Number of Visits 13    Date for PT Re-Evaluation 11/27/20    Authorization Type MCR: Kx mod at15th visit, FOTO at 6th and 10th visit.    PT Start Time 1100    PT Stop Time 1142    PT Time Calculation (min) 42 min    Activity Tolerance Patient tolerated treatment well    Behavior During Therapy WFL for tasks assessed/performed           Past Medical History:  Diagnosis Date  . Abnormal gait   . Amnesia   . Anemia    hx  . Aneurysm ([place])   . Anxiety   . Asthma   . Atrophic vaginitis   . Benign hypertensive heart disease   . Cardiomegaly   . Chest pain   . COPD (chronic obstructive pulmonary disease) ([place])     - PFTs  01/24/06 FEV1 69% ratio 68% diffusing capacity 57% with no improvement after B2   - PFT's 10/1/ 09 FEV1 77   ratio  76    dlco                   45            no resp to B2    - Nl alpha one antitripsin level 10/09  . Cough   . Depression   . Difficulty speaking   . Disorder of nasal cavity   . Dyspnea   . Enthesopathy of elbow region   . GERD (gastroesophageal reflux disease)   . Hammer toe   . Headache(784.0)   . Hemiparesis affecting dominant side as late effect of cerebrovascular accident ([place])   . History of kidney stones   . Hyperlipidemia   . Hypertension   . Hypothyroidism   . Increased urinary frequency   . Low compliance bladder   . Macular degeneration of both eyes   . Malaise and fatigue   . Nonruptured cerebral aneurysm   . Obesity   . Pneumonia    hx  . Pure hypercholesterolemia   . Recurrent major depressive episodes ([place])   . Seizure disorder,  secondary ([place])    2-3 focal siezures monthly  . Seizures ([place])   . Short-term memory loss    since stroke  . Skin sensation disturbance   . Stroke [facility]) 2011   rt sided weakness  . Swelling of limb   . TMJ (temporomandibular joint disorder)   . Urinary tract infectious disease   . Vertigo as late effect of stroke   . Vitamin D deficiency     Past Surgical History:  Procedure Laterality Date  . BACK SURGERY    . CEREBRAL ANGIOGRAM     x3  . COLONOSCOPY WITH PROPOFOL N/A 08/18/2016   Procedure: COLONOSCOPY WITH PROPOFOL;  Surgeon: Carol Ada, MD;  Location: [facility];  Service: Endoscopy;  Laterality: N/A;  . LAPAROSCOPIC HYSTERECTOMY    . MANDIBLE FRACTURE SURGERY    . RADIOLOGY WITH ANESTHESIA N/A 08/21/2013   Procedure: RADIOLOGY WITH ANESTHESIA;  Surgeon: Rob Hickman, MD;  Location: [facility];  Service: Radiology;  Laterality: N/A;  . stones     no surgery    There were no vitals filed for this visit.   Subjective Assessment - 11/16/20 1534    Subjective Patien reports no headachedtoday. she reports her stiffness has improved. She is not having any pain in her neck    Pertinent History significant PMHx    Limitations Walking;Standing    How long can you sit comfortably? unlimited    How long can you stand comfortably? 10 min with SPC    How long can you walk comfortably? 15- 20 min with [facility]    Diagnostic tests 10/14/2020 MRI   IMPRESSION:  Stenting of distal left vertebral artery which is patent. Small left  PICA aneurysm unchanged     Unfortunately, despite 2 attempts, the left pericallosal aneurysm is  not fully imaged. If desired, the patient could return for  additional MRA imaging.    Patient Stated Goals stop headaches, reducce stiffnes    Currently in Pain? No/denies                             [facility] Adult PT Treatment/Exercise - 11/16/20 0001      Neck Exercises: Standing   Other Standing Exercises scap retraction 2x15 red; shoulder  extension 2x15 red      Neck Exercises: Seated   Other Seated Exercise bilateral er 2x10 red; horizontal abduction 2x10 red; bilateral flexion with abdcution 2x10 red      Manual Therapy   Manual Therapy Soft tissue mobilization;Manual Traction    Soft tissue mobilization to upper traps and cervical spine    Manual Traction gentle manual traction      Neck Exercises: Stretches   Upper Trapezius Stretch 2 reps;Left;Right;30 seconds    Levator Stretch Left;Right;30 seconds;2 reps            Trigger Point Dry Needling - 11/16/20 0001    Consent Given? Yes    Education Handout Provided Yes    Muscles Treated Head and Neck Suboccipitals    Upper Trapezius Response Twitch reponse elicited;Palpable increased muscle length    Suboccipitals Response Twitch response elicited;Palpable increased muscle length                PT Education - 11/16/20 1538    Education Details reviewedprogression of bands    Person(s) Educated Patient    Methods Explanation;Demonstration;Tactile cues;Verbal cues    Comprehension Returned demonstration;Verbalized understanding;Verbal cues required;Tactile cues required            PT Short Term Goals - 10/16/20 1120      PT SHORT TERM GOAL #1   Title pt to be IND with initial HEP    Time 3    Period Weeks    Status New    Target Date 11/06/20      PT SHORT TERM GOAL #2   Title pt to verbalize / demo efficient posture to reduce muscle tension and neck pain/ headaches.    Time 3    Period Weeks    Status New    Target Date 11/06/20             PT Long Term Goals - 10/16/20 1316      PT LONG TERM GOAL #1   Title pt to increase cerivcal rotaiton by >/= 5 degrees and maintain all other functional ranges with </=2/10 max pain for ADLs    Baseline -    Time  6    Period Weeks    Status New    Target Date 11/27/20      PT LONG TERM GOAL #2   Title increase bil gross shoulder strength to >/= 4+/5 to promote postureal efficiency     Baseline -    Time 6    Period Weeks    Status New    Target Date 11/27/20      PT LONG TERM GOAL #3   Title increase neck FOTO to >/= 55% and craniofacial to >/= 47% to demo improvement in functoin    Baseline -    Time 6    Period Weeks    Status New    Target Date 11/27/20      PT LONG TERM GOAL #4   Title pt to report HA frequency to </= 1 every 2 weeks for improvement in condition and QOL    Baseline -    Time 6    Period Weeks    Status New    Target Date 11/27/20      PT LONG TERM GOAL #5   Title pt to be IND with all HEP and is able to maintain and progress current LOF IND    Baseline -    Time 6    Period Weeks    Status New    Target Date 11/27/20                 Plan - 11/16/20 1138    Clinical Impression Statement Patient is making good progress. She had been doing her exercises at home. she had mild headaches over the week but for the most part they are improving. She had mild spasming in herright upper trap and sub-occipital. She tolerated needling well. therapy advanced her band to a red band and gave her badn flexion with abduction. She was also given a green band for home.    Comorbidities hx of 2 strokes, anxiety, depression    Stability/Clinical Decision Making Evolving/Moderate complexity    Clinical Decision Making Moderate    Rehab Potential Good    PT Frequency 2x / week    PT Duration 6 weeks    PT Treatment/Interventions ADLs/Self Care Home Management;Cryotherapy;Electrical Stimulation;Iontophoresis 4mg /ml Dexamethasone;Moist Heat;Ultrasound;Neuromuscular re-education;Therapeutic exercise;Therapeutic activities;Functional mobility training;Patient/family education;Manual techniques;Passive range of motion;Dry needling;Taping    PT Next Visit Plan review/ update HEP PRN, response DN and continue for bil upper trap. posture education, posterior shoulder strengthening,    PT Home Exercise Plan [id number] - seated chin tuck, upper trap stretch,  levator scapuale stretch, scapular retraction    Consulted and Agree with Plan of Care Patient           Patient will benefit from skilled therapeutic intervention in order to improve the following deficits and impairments:  Abnormal gait,Decreased strength,Improper body mechanics,Increased muscle spasms,Pain,Postural dysfunction,Decreased activity tolerance,Decreased endurance,Decreased range of motion  Visit Diagnosis: Cervicalgia  Other muscle spasm  Abnormal posture     Problem List Patient Active Problem List   Diagnosis Date Noted  . Family history of heart disease 06/14/2020  . History of CVA (cerebrovascular accident) 06/14/2020  . Exudative age-related macular degeneration of right eye with active choroidal neovascularization ([place]) 09/11/2019  . Nocturnal hypoxemia 09/11/2019  . Non-restorative sleep 06/13/2019  . Benign myoclonus of drowsiness 06/13/2019  . Excessive daytime sleepiness 06/13/2019  . 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  . Hemiparesis affecting right side as late effect of cerebrovascular accident ([place]) 06/13/2019  . Macular  degeneration of right eye 06/11/2019  . Class 1 obesity due to excess calories with serious comorbidity and body mass index (BMI) of 31.0 to 31.9 in adult 06/11/2019  . Moderate episode of recurrent major depressive disorder ([place]) 06/11/2019  . Spastic hemiparesis ([place]) 03/25/2019  . Prediabetes 01/01/2019  . Acquired hypothyroidism 01/01/2019  . Hypertension 01/01/2019  . Aortic atherosclerosis ([place]) 11/07/2018  . History of embolic stroke without residual deficits 03/29/2018  . Seizure disorder ([place]) 01/12/2017  . Common migraine 01/12/2017  . Aneurysm ([place])   . Brain aneurysm 08/21/2013  . Right foot drop 04/10/2013  . Intracerebral hemorrhage ([place]) 03/19/2013  . Convulsions ([place]) 03/19/2013  . Headache 03/19/2013  . Abnormality of gait 03/19/2013  . Edema 02/23/2011  . COUGH 06/10/2008  .  HYPERLIPIDEMIA 04/25/2008  . TOBACCO ABUSE 04/25/2008  . ASTHMA 04/25/2008  . Chronic obstructive pulmonary disease ([place]) 04/25/2008    Carney Living PT DPT  11/16/2020, 3:56 PM  [facility] [address] [place], [place], [address] Phone: [phone]   Fax:  [phone]  Name: NYASHIA RANEY MRN: [id number] Date of Birth: 01-08-1956

## 2020-11-17 ENCOUNTER — Encounter: Payer: Self-pay | Admitting: Pulmonary Disease

## 2020-11-17 ENCOUNTER — Ambulatory Visit (INDEPENDENT_AMBULATORY_CARE_PROVIDER_SITE_OTHER): Payer: Medicare Other | Admitting: Pulmonary Disease

## 2020-11-17 DIAGNOSIS — H353211 Exudative age-related macular degeneration, right eye, with active choroidal neovascularization: Secondary | ICD-10-CM | POA: Diagnosis not present

## 2020-11-17 DIAGNOSIS — J449 Chronic obstructive pulmonary disease, unspecified: Secondary | ICD-10-CM

## 2020-11-17 DIAGNOSIS — H353122 Nonexudative age-related macular degeneration, left eye, intermediate dry stage: Secondary | ICD-10-CM | POA: Diagnosis not present

## 2020-11-17 MED ORDER — BENZONATATE 200 MG PO CAPS: 200.0000 mg | ORAL_CAPSULE | Freq: Three times a day (TID) | ORAL | 0 refills | Status: DC | PRN

## 2020-11-17 MED ORDER — ALBUTEROL SULFATE (2.5 MG/3ML) 0.083% IN NEBU: 2.5000 mg | INHALATION_SOLUTION | Freq: Four times a day (QID) | RESPIRATORY_TRACT | 1 refills | Status: DC

## 2020-11-17 MED ORDER — ALBUTEROL SULFATE HFA 108 (90 BASE) MCG/ACT IN AERS: 2.0000 | INHALATION_SPRAY | Freq: Four times a day (QID) | RESPIRATORY_TRACT | 3 refills | Status: DC | PRN

## 2020-11-17 NOTE — Patient Instructions (Signed)
Prescription for benzonatate sent to [facility]  Prescription for albuterol nebulizer treatments sent to [facility]  I will see you back in 6 months  Graded exercise as tolerated as we discussed

## 2020-11-17 NOTE — Progress Notes (Signed)
Subjective:    Patient ID: [name], female    DOB: August 25, 1956, 65 y.o.   MRN: [id number]  She feels better overall  She was having issues with difficulty sleeping She does have some shortness of breath for which she has been on nebulization treatments which have helped  Patient does have a history of COPD in the past Quit smoking in 2011  She did have a brain bleed in 2011-2 events, had stents placed Prolonged recovery  Continues on oxygen supplementation at night Previous PFT shows obstructive disease with significant bronchodilator response  There may be a component of deconditioning as she has not been very active since Covid  She remembers being on oxygen prior to her stroke, remembers a diagnosis of COPD/emphysema She was using inhalers and nebulizers prior to that  Currently able to ambulate, does get short of breath with exertion Uses a walker, unsteady, if she goes slowly she is able to take of 100 steps  She does have daytime fatigue and sleepiness Multiple medications may be contributing to this   Comorbidities include hypertension, hypercholesterolemia, chronic headaches, CVA    Past Medical History:  Diagnosis Date  . Abnormal gait   . Amnesia   . Anemia    hx  . Aneurysm ([place])   . Anxiety   . Asthma   . Atrophic vaginitis   . Benign hypertensive heart disease   . Cardiomegaly   . Chest pain   . COPD (chronic obstructive pulmonary disease) ([place])     - PFTs  01/24/06 FEV1 69% ratio 68% diffusing capacity 57% with no improvement after B2   - PFT's 10/1/ 09 FEV1 77   ratio  76    dlco                   45            no resp to B2    - Nl alpha one antitripsin level 10/09  . Cough   . Depression   . Difficulty speaking   . Disorder of nasal cavity   . Dyspnea   . Enthesopathy of elbow region   . GERD (gastroesophageal reflux disease)   . Hammer toe   . Headache(784.0)   . Hemiparesis affecting dominant side as late effect of  cerebrovascular accident ([place])   . History of kidney stones   . Hyperlipidemia   . Hypertension   . Hypothyroidism   . Increased urinary frequency   . Low compliance bladder   . Macular degeneration of both eyes   . Malaise and fatigue   . Nonruptured cerebral aneurysm   . Obesity   . Pneumonia    hx  . Pure hypercholesterolemia   . Recurrent major depressive episodes ([place])   . Seizure disorder, secondary ([place])    2-3 focal siezures monthly  . Seizures ([place])   . Short-term memory loss    since stroke  . Skin sensation disturbance   . Stroke [facility]) 2011   rt sided weakness  . Swelling of limb   . TMJ (temporomandibular joint disorder)   . Urinary tract infectious disease   . Vertigo as late effect of stroke   . Vitamin D deficiency    Social History   Socioeconomic History  . Marital status: Married    Spouse name: Jori Moll  . Number of children: 2  . Years of education: 46  . Highest education level: Not on file  Occupational History  .  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 Smoking status: Former Smoker    Packs/day: 2.00    Years: 30.00    Pack years: 60.00    Types: Cigarettes    Quit date: 11/27/2009    Years since quitting: 10.9  . Smokeless tobacco: Never Used  . Tobacco comment: smoked 2.5 ppd x 5 years, 1ppd x 20+ years  Vaping Use  . Vaping Use: Never used  Substance and Sexual Activity  . Alcohol use: No    Alcohol/week: 0.0 standard drinks    Comment: patient quit alcohol use 2005.   . Drug use: No  . Sexual activity: Not Currently  Other Topics Concern  . Not on file  Social History Narrative   Patient lives at home with her husband Jori Moll).   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    Patient is on disability.    Patient has an Associates degree.   Patient is right-handed.   Patient drinks 2 cups of green tea most days.               Social Determinants  of Health   Financial Resource Strain: Low Risk   . Difficulty of Paying Living Expenses: Not hard at all  Food Insecurity: No Food Insecurity  . Worried About Charity fundraiser in the Last Year: Never true  . Ran Out of Food in the Last Year: Never true  Transportation Needs: No Transportation Needs  . Lack of Transportation (Medical): No  . Lack of Transportation (Non-Medical): No  Physical Activity: Inactive  . Days of Exercise per Week: 0 days  . Minutes of Exercise per Session: 0 min  Stress: No Stress Concern Present  . Feeling of Stress : Not at all  Social Connections: Not on file  Intimate Partner Violence: Not on file   Family History  Problem Relation Age of Onset  . Heart disease Mother   . Heart disease Father   . Heart attack Father   . Cancer Father        prostate to lymph node  . Lung cancer Other   . Breast cancer Neg Hx    Review of Systems  Constitutional: Negative for fever and unexpected weight change.  HENT: Positive for dental problem, nosebleeds and sneezing. Negative for congestion, ear pain, postnasal drip, rhinorrhea, sinus pressure, sore throat and trouble swallowing.   Eyes: Negative for redness and itching.  Respiratory: Positive for cough and shortness of breath. Negative for chest tightness and wheezing.   Cardiovascular: Positive for leg swelling. Negative for palpitations.  Gastrointestinal: Negative for nausea and vomiting.  Genitourinary: Negative for dysuria.  Musculoskeletal: Positive for joint swelling.  Skin: Negative for rash.  Allergic/Immunologic: Negative.  Negative for environmental allergies, food allergies and immunocompromised state.  Neurological: Positive for headaches.  Psychiatric/Behavioral: Negative for dysphoric mood.       Objective:   Physical Exam Constitutional:      Appearance: Normal appearance.  HENT:     Head: Normocephalic and atraumatic.     Nose: No congestion.  Eyes:     Pupils: Pupils are equal,  round, and reactive to light.  Cardiovascular:     Rate and Rhythm: Normal rate and regular rhythm.     Pulses: Normal pulses.     Heart sounds: Normal heart sounds. No murmur heard. No friction rub.  Pulmonary:  Effort: Pulmonary effort is normal. No respiratory distress.     Breath sounds: Normal breath sounds. No stridor. No wheezing or rhonchi.  Musculoskeletal:     Cervical back: Normal range of motion. No rigidity or tenderness.  Neurological:     Mental Status: She is alert.  Psychiatric:        Mood and Affect: Mood normal.    Vitals:   11/17/20 1059  BP: 114/74  Pulse: 89  Temp: 98.1 F (36.7 C)  SpO2: 96%   Results of the Epworth flowsheet 08/21/2019  Sitting and reading 2  Watching TV 2  Sitting, inactive in a public place (e.g. a theatre or a meeting) 2  As a passenger in a car for an hour without a break 3  Lying down to rest in the afternoon when circumstances permit 3  Sitting and talking to someone 0  Sitting quietly after a lunch without alcohol 2  In a car, while stopped for a few minutes in traffic 0  Total score 14   Recent PFT reviewed showing small airway disease Nocturnal oximetry with nocturnal desaturations    Assessment & Plan:  .  Patient with past history of COPD .  Oxygen supplementation in the past -Continue oxygen supplementation at night  Recent exacerbation for which she needed antibiotics -She still has a cough -Increased dose of Tessalon Perles from 100 3 times daily to 200 3 times daily  Continue oxygen supplementation at night  Continue albuterol  Nebulization treatments as needed  .  I will see her back in about 6 months  .  Encouraged to call if any significant concerns or symptoms

## 2020-11-18 ENCOUNTER — Ambulatory Visit: Payer: Medicare Other | Admitting: Physical Therapy

## 2020-11-23 ENCOUNTER — Encounter: Payer: Self-pay | Admitting: Physical Therapy

## 2020-11-23 ENCOUNTER — Ambulatory Visit: Payer: Medicare Other | Admitting: Physical Therapy

## 2020-11-23 ENCOUNTER — Other Ambulatory Visit: Payer: Self-pay

## 2020-11-23 DIAGNOSIS — M542 Cervicalgia: Secondary | ICD-10-CM | POA: Diagnosis not present

## 2020-11-23 DIAGNOSIS — M62838 Other muscle spasm: Secondary | ICD-10-CM | POA: Diagnosis not present

## 2020-11-23 DIAGNOSIS — R293 Abnormal posture: Secondary | ICD-10-CM | POA: Diagnosis not present

## 2020-11-23 NOTE — Therapy (Addendum)
[place], [place], [address] Phone: [phone]   Fax:  [phone]  Physical Therapy Treatment / Discharge  Patient Details  Name: [name] MRN: [id number] Date of Birth: 01-Feb-1956 Referring Provider (PT): Frann Rider, NP   Encounter Date: 11/23/2020   PT End of Session - 11/23/20 1057    Visit Number 5    Number of Visits 13    Date for PT Re-Evaluation 11/27/20    Authorization Type MCR: Kx mod at15th visit, FOTO at 6th and 10th visit.    PT Start Time 1103    PT Stop Time 1143    PT Time Calculation (min) 40 min    Activity Tolerance Patient tolerated treatment well    Behavior During Therapy WFL for tasks assessed/performed           Past Medical History:  Diagnosis Date   Abnormal gait    Amnesia    Anemia    hx   Aneurysm (HCC)    Anxiety    Asthma    Atrophic vaginitis    Benign hypertensive heart disease    Cardiomegaly    Chest pain    COPD (chronic obstructive pulmonary disease) ([place])     - PFTs  01/24/06 FEV1 69% ratio 68% diffusing capacity 57% with no improvement after B2   - PFT's 10/1/ 09 FEV1 77   ratio  76    dlco                   45            no resp to B2    - Nl alpha one antitripsin level 10/09   Cough    Depression    Difficulty speaking    Disorder of nasal cavity    Dyspnea    Enthesopathy of elbow region    GERD (gastroesophageal reflux disease)    Hammer toe    Headache(784.0)    Hemiparesis affecting dominant side as late effect of cerebrovascular accident ([place])    History of kidney stones    Hyperlipidemia    Hypertension    Hypothyroidism    Increased urinary frequency    Low compliance bladder    Macular degeneration of both eyes    Malaise and fatigue    Nonruptured cerebral aneurysm    Obesity    Pneumonia    hx   Pure hypercholesterolemia    Recurrent major depressive episodes ([place])     Seizure disorder, secondary ([place])    2-3 focal siezures monthly   Seizures ([place])    Short-term memory loss    since stroke   Skin sensation disturbance    Stroke ([place]) 2011   rt sided weakness   Swelling of limb    TMJ (temporomandibular joint disorder)    Urinary tract infectious disease    Vertigo as late effect of stroke    Vitamin D deficiency     Past Surgical History:  Procedure Laterality Date   BACK SURGERY     CEREBRAL ANGIOGRAM     x3   COLONOSCOPY WITH PROPOFOL N/A 08/18/2016   Procedure: COLONOSCOPY WITH PROPOFOL;  Surgeon: Carol Ada, MD;  Location: [facility];  Service: Endoscopy;  Laterality: N/A;   LAPAROSCOPIC HYSTERECTOMY     MANDIBLE FRACTURE SURGERY     RADIOLOGY WITH ANESTHESIA N/A 08/21/2013   Procedure: RADIOLOGY WITH ANESTHESIA;  Surgeon: Rob Hickman, MD;  Location: [facility];  Service: Radiology;  Laterality: N/A;   stones     no surgery    There were no vitals filed for this visit.   Subjective Assessment - 11/23/20 1533    Subjective Patient fell last week. She has a sore left wrist and tailbone. She feels like her neck may be a little sore but overall it is doing pretty well. She may need work on her bracing.    Pertinent History significant PMHx    Limitations Walking;Standing    How long can you sit comfortably? unlimited    How long can you stand comfortably? 10 min with [facility]    How long can you walk comfortably? 15- 20 min with [facility]    Diagnostic tests 10/14/2020 MRI   IMPRESSION:  Stenting of distal left vertebral artery which is patent. Small left  PICA aneurysm unchanged     Unfortunately, despite 2 attempts, the left pericallosal aneurysm is  not fully imaged. If desired, the patient could return for  additional MRA imaging.    Patient Stated Goals stop headaches, reducce stiffnes    Currently in Pain? Yes   " just a little sore"   Pain Score 3     Pain Location Head    Pain Orientation Right    Pain Descriptors /  Indicators Aching    Pain Type Chronic pain    Pain Onset More than a month ago    Pain Frequency Constant    Aggravating Factors  looking left and right    Pain Relieving Factors N/A    Effect of Pain on Daily Activities Limited ROM    Multiple Pain Sites No                               Trigger Point Dry Needling - 11/23/20 0001    Consent Given? Yes    Education Handout Provided Yes    Muscles Treated Head and Neck Suboccipitals    Dry Needling Comments 2 spots in left and right upper traps and 1 spot in each sub-occipital group    Upper Trapezius Response Twitch reponse elicited;Palpable increased muscle length    Suboccipitals Response Twitch response elicited;Palpable increased muscle length                PT Education - 11/23/20 1539    Education Details HEP and symptom management    Person(s) Educated Patient    Methods Explanation;Tactile cues;Verbal cues;Demonstration    Comprehension Verbalized understanding;Returned demonstration;Verbal cues required;Tactile cues required            PT Short Term Goals - 10/16/20 1120      PT SHORT TERM GOAL #1   Title pt to be IND with initial HEP    Time 3    Period Weeks    Status New    Target Date 11/06/20      PT SHORT TERM GOAL #2   Title pt to verbalize / demo efficient posture to reduce muscle tension and neck pain/ headaches.    Time 3    Period Weeks    Status New    Target Date 11/06/20             PT Long Term Goals - 10/16/20 1316      PT LONG TERM GOAL #1   Title pt to increase cerivcal rotaiton by >/= 5 degrees and maintain all other functional ranges with </=2/10 max pain for  ADLs    Baseline -    Time 6    Period Weeks    Status New    Target Date 11/27/20      PT LONG TERM GOAL #2   Title increase bil gross shoulder strength to >/= 4+/5 to promote postureal efficiency    Baseline -    Time 6    Period Weeks    Status New    Target Date 11/27/20      PT LONG  TERM GOAL #3   Title increase neck FOTO to >/= 55% and craniofacial to >/= 47% to demo improvement in functoin    Baseline -    Time 6    Period Weeks    Status New    Target Date 11/27/20      PT LONG TERM GOAL #4   Title pt to report HA frequency to </= 1 every 2 weeks for improvement in condition and QOL    Baseline -    Time 6    Period Weeks    Status New    Target Date 11/27/20      PT LONG TERM GOAL #5   Title pt to be IND with all HEP and is able to maintain and progress current LOF IND    Baseline -    Time 6    Period Weeks    Status New    Target Date 11/27/20                 Plan - 11/23/20 1540    Clinical Impression Statement Despite fal, the patient is making progress with her newck. She had minimal twitch respose with the neck today. Per palpation her spasming has improved significantly. Her biggest prblems right now are balance and bracing problems. She also feels like she is having decreased use of her right hand. She tolerated ther-ex well today. Therapy will perfrom a full re-assessment next visit.    Personal Factors and Comorbidities Comorbidity 3+;Past/Current Experience    Comorbidities hx of 2 strokes, anxiety, depression    Stability/Clinical Decision Making Evolving/Moderate complexity    Clinical Decision Making Moderate    Rehab Potential Good    PT Frequency 2x / week    PT Duration 6 weeks    PT Treatment/Interventions ADLs/Self Care Home Management;Cryotherapy;Electrical Stimulation;Iontophoresis 4mg /ml Dexamethasone;Moist Heat;Ultrasound;Neuromuscular re-education;Therapeutic exercise;Therapeutic activities;Functional mobility training;Patient/family education;Manual techniques;Passive range of motion;Dry needling;Taping    PT Next Visit Plan review/ update HEP PRN, response DN and continue for bil upper trap. posture education, posterior shoulder strengthening,    PT Home Exercise Plan [id number] - seated chin tuck, upper trap stretch,  levator scapuale stretch, scapular retraction    Consulted and Agree with Plan of Care Patient           Patient will benefit from skilled therapeutic intervention in order to improve the following deficits and impairments:  Abnormal gait,Decreased strength,Improper body mechanics,Increased muscle spasms,Pain,Postural dysfunction,Decreased activity tolerance,Decreased endurance,Decreased range of motion  Visit Diagnosis: Cervicalgia  Other muscle spasm  Abnormal posture     Problem List Patient Active Problem List   Diagnosis Date Noted   Family history of heart disease 06/14/2020   History of CVA (cerebrovascular accident) 06/14/2020   Exudative age-related macular degeneration of right eye with active choroidal neovascularization ([place]) 09/11/2019   Nocturnal hypoxemia 09/11/2019   Non-restorative sleep 06/13/2019   Benign myoclonus of drowsiness 06/13/2019   Excessive daytime sleepiness 06/13/2019   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  Hemiparesis affecting right side as late effect of cerebrovascular accident ([place]) 06/13/2019   Macular degeneration of right eye 06/11/2019   Class 1 obesity due to excess calories with serious comorbidity and body mass index (BMI) of 31.0 to 31.9 in adult 06/11/2019   Moderate episode of recurrent major depressive disorder ([place]) 06/11/2019   Spastic hemiparesis ([place]) 03/25/2019   Prediabetes 01/01/2019   Acquired hypothyroidism 01/01/2019   Hypertension 01/01/2019   Aortic atherosclerosis ([place]) 11/07/2018   History of embolic stroke without residual deficits 03/29/2018   Seizure disorder ([place]) 01/12/2017   Common migraine 01/12/2017   Aneurysm ([place])    Brain aneurysm 08/21/2013   Right foot drop 04/10/2013   Intracerebral hemorrhage ([place]) 03/19/2013   Convulsions ([place]) 03/19/2013   Headache 03/19/2013   Abnormality of gait 03/19/2013   Edema 02/23/2011   COUGH 06/10/2008    HYPERLIPIDEMIA 04/25/2008   TOBACCO ABUSE 04/25/2008   ASTHMA 04/25/2008   Chronic obstructive pulmonary disease ([place]) 04/25/2008    Carney Living PT DPT  11/23/2020, 3:44 PM  [facility] [address] [place], [place], [address] Phone: [phone]   Fax:  [phone]  Name: [name] MRN: [id number] Date of Birth: 1955-10-08       PHYSICAL THERAPY DISCHARGE SUMMARY  Visits from Start of Care: 5  Current functional level related to goals / functional outcomes: See goals FOTO craniofacial 86% function, neck 63% function   Remaining deficits: See assessment   Education / Equipment: HEP, theraband, posture, lifting mechanics,   Plan: Patient agrees to discharge.  Patient goals were partially met. Patient is being discharged due to being pleased with the current functional level.  ?????         Kristoffer Leamon PT, DPT, LAT, ATC  11/25/20  2:45 PM

## 2020-11-25 ENCOUNTER — Ambulatory Visit: Payer: Medicare Other | Admitting: Physical Therapy

## 2020-12-04 ENCOUNTER — Other Ambulatory Visit: Payer: Self-pay | Admitting: Internal Medicine

## 2021-01-05 ENCOUNTER — Other Ambulatory Visit: Payer: Self-pay | Admitting: Internal Medicine

## 2021-01-05 ENCOUNTER — Other Ambulatory Visit: Payer: Self-pay

## 2021-01-05 ENCOUNTER — Telehealth: Payer: Self-pay

## 2021-01-05 MED ORDER — CYCLOBENZAPRINE HCL 10 MG PO TABS: 1.0000 | ORAL_TABLET | Freq: Every evening | ORAL | 3 refills | Status: DC | PRN

## 2021-01-05 MED ORDER — ALPRAZOLAM 0.25 MG PO TABS: 0.2500 mg | ORAL_TABLET | Freq: Two times a day (BID) | ORAL | 2 refills | Status: DC | PRN

## 2021-01-05 NOTE — Telephone Encounter (Signed)
The pt was notified that the office doesn't send 180 quantity of alprazolam to the pharmacy and does the pt want to a psychiatrist because the pt left a message requesting 90 days of of alprazolam sent to the pharmacy.

## 2021-01-11 DIAGNOSIS — Z23 Encounter for immunization: Secondary | ICD-10-CM | POA: Diagnosis not present

## 2021-02-09 DIAGNOSIS — H43823 Vitreomacular adhesion, bilateral: Secondary | ICD-10-CM | POA: Diagnosis not present

## 2021-02-09 DIAGNOSIS — H35033 Hypertensive retinopathy, bilateral: Secondary | ICD-10-CM | POA: Diagnosis not present

## 2021-02-09 DIAGNOSIS — H353122 Nonexudative age-related macular degeneration, left eye, intermediate dry stage: Secondary | ICD-10-CM | POA: Diagnosis not present

## 2021-02-09 DIAGNOSIS — H353211 Exudative age-related macular degeneration, right eye, with active choroidal neovascularization: Secondary | ICD-10-CM | POA: Diagnosis not present

## 2021-02-12 DIAGNOSIS — L814 Other melanin hyperpigmentation: Secondary | ICD-10-CM | POA: Diagnosis not present

## 2021-02-12 DIAGNOSIS — L821 Other seborrheic keratosis: Secondary | ICD-10-CM | POA: Diagnosis not present

## 2021-02-12 DIAGNOSIS — L578 Other skin changes due to chronic exposure to nonionizing radiation: Secondary | ICD-10-CM | POA: Diagnosis not present

## 2021-02-12 DIAGNOSIS — D1801 Hemangioma of skin and subcutaneous tissue: Secondary | ICD-10-CM | POA: Diagnosis not present

## 2021-02-12 DIAGNOSIS — L570 Actinic keratosis: Secondary | ICD-10-CM | POA: Diagnosis not present

## 2021-03-09 ENCOUNTER — Encounter (INDEPENDENT_AMBULATORY_CARE_PROVIDER_SITE_OTHER): Payer: Self-pay

## 2021-03-09 ENCOUNTER — Telehealth: Payer: Self-pay | Admitting: Cardiology

## 2021-03-09 NOTE — Telephone Encounter (Signed)
  Pt c/o of Chest Pain: STAT if CP now or developed within 24 hours  1. Are you having CP right now? no  2. Are you experiencing any other symptoms (ex. SOB, nausea, vomiting, sweating)? A little sob but she is on oxygen  3. How long have you been experiencing CP? 2 weeks  4. Is your CP continuous or coming and going? Comes and goes  5. Have you taken Nitroglycerin? No ?   Pt said for 2 weeks now she's been light pain below her breast, she said the most concerning feeling she's been having is her left arm feels numb and she can feel it on her Jaw.

## 2021-03-09 NOTE — Telephone Encounter (Signed)
Returned call to Pt.  Per Pt she is having new chest discomfort around her left breast.  She states she is not currently having it -it is intermittent.   Describes it as "a little discomfort".    Pt states chest discomfort is new along with new left arm numbness from under her arm to her fingers.    She states she SOB which is chronic-this is no worse than normal.  She also complains of jaw discomfort "like it wants to lock on the left side and is sore".  States she feels popping and cracking when she chews.    She states she had an episode last week where she felt like it was hard to breathe, and felt a hot flush that lasted a few minutes before resolving.  Pt is concerned about the chest discomfort and left arm numbness.   Pt was in no distress during phone call.  Advised would send to Dr. Harrell Gave for further advisement.  Pt in agreement.  Awaits call back with advisement.

## 2021-03-12 NOTE — Telephone Encounter (Signed)
Called patient. Advised of message below. Patient verbalized understanding.

## 2021-03-12 NOTE — Telephone Encounter (Signed)
She had a normal nuclear stress test last year, but if these episodes progress, becoming more severe, the best way to get urgently evaluated is in the ER. She has an appt with Urban Gibson on 7/20, but if she has severe/unrelenting symptoms in the interim she needs to call 911 and go to ER.

## 2021-03-16 ENCOUNTER — Other Ambulatory Visit: Payer: Self-pay

## 2021-03-16 ENCOUNTER — Encounter: Payer: Self-pay | Admitting: Internal Medicine

## 2021-03-16 ENCOUNTER — Ambulatory Visit (INDEPENDENT_AMBULATORY_CARE_PROVIDER_SITE_OTHER): Payer: Medicare Other | Admitting: Internal Medicine

## 2021-03-16 VITALS — BP 110/60 | HR 90 | Temp 98.0°F | Ht 62.0 in | Wt 157.4 lb

## 2021-03-16 DIAGNOSIS — R350 Frequency of micturition: Secondary | ICD-10-CM

## 2021-03-16 DIAGNOSIS — R7309 Other abnormal glucose: Secondary | ICD-10-CM | POA: Diagnosis not present

## 2021-03-16 DIAGNOSIS — H353211 Exudative age-related macular degeneration, right eye, with active choroidal neovascularization: Secondary | ICD-10-CM

## 2021-03-16 DIAGNOSIS — I200 Unstable angina: Secondary | ICD-10-CM

## 2021-03-16 DIAGNOSIS — I119 Hypertensive heart disease without heart failure: Secondary | ICD-10-CM

## 2021-03-16 DIAGNOSIS — I700 Atherosclerosis of aorta: Secondary | ICD-10-CM

## 2021-03-16 DIAGNOSIS — E039 Hypothyroidism, unspecified: Secondary | ICD-10-CM | POA: Diagnosis not present

## 2021-03-16 DIAGNOSIS — Z23 Encounter for immunization: Secondary | ICD-10-CM | POA: Diagnosis not present

## 2021-03-16 LAB — POCT URINALYSIS DIPSTICK
Bilirubin, UA: NEGATIVE
Blood, UA: NEGATIVE
Glucose, UA: NEGATIVE
Ketones, UA: NEGATIVE
Leukocytes, UA: NEGATIVE
Nitrite, UA: NEGATIVE
Protein, UA: NEGATIVE
Spec Grav, UA: <=1.005 — AB (ref 1.010–1.025)
Urobilinogen, UA: 0.2 E.U./dL
pH, UA: 6 (ref 5.0–8.0)

## 2021-03-16 NOTE — Patient Instructions (Signed)
Cooking With Less Salt Cooking with less salt is one way to reduce the amount of sodium you get from food. Sodium is one of the elements that make up salt. It is found naturally in foods and is also added to certain foods. Depending on your condition and overall health, your health care provider or dietitian may recommend that you reduce your sodium intake. Most people should have less than 2,300 milligrams (mg) of sodium each day. If you have high blood pressure (hypertension), you may need to limit your sodium to 1,500 mg each day. Follow the tipsbelow to help reduce your sodium intake. What are tips for eating less sodium? Reading food labels  Check the food label before buying or using packaged ingredients. Always check the label for the serving size and sodium content. Look for products with no more than 140 mg of sodium in one serving. Check the % Daily Value column to see what percent of the daily recommended amount of sodium is provided in one serving of the product. Foods with 5% or less in this column are considered low in sodium. Foods with 20% or higher are considered high in sodium. Do not choose foods with salt as one of the first three ingredients on the ingredients list. If salt is one of the first three ingredients, it usually means the item is high in sodium.  Shopping Buy sodium-free or low-sodium products. Look for the following words on food labels: Low-sodium. Sodium-free. Reduced-sodium. No salt added. Unsalted. Always check the sodium content even if foods are labeled as low-sodium or no salt added. Buy fresh foods. Cooking Use herbs, seasonings without salt, and spices as substitutes for salt. Use sodium-free baking soda when baking. Grill, braise, or roast foods to add flavor with less salt. Avoid adding salt to pasta, rice, or hot cereals. Drain and rinse canned vegetables, beans, and meat before use. Avoid adding salt when cooking sweets and desserts. Cook with  low-sodium ingredients. What foods are high in sodium? Vegetables Regular canned vegetables (not low-sodium or reduced-sodium). Sauerkraut, pickled vegetables, and relishes. Olives. French fries. Onion rings. Regular canned tomato sauce and paste. Regular tomato and vegetable juice. Frozenvegetables in sauces. Grains Instant hot cereals. Bread stuffing, pancake, and biscuit mixes. Croutons. Seasoned rice or pasta mixes. Noodle soup cups. Boxed or frozen macaroni and cheese. Regular salted crackers. Self-rising flour. Rolls. Bagels. Flourtortillas and wraps. Meats and other proteins Meat or fish that is salted, canned, smoked, cured, spiced, or pickled. This includes bacon, ham, sausages, hot dogs, corned beef, chipped beef, meat loaves, salt pork, jerky, pickled herring, anchovies, regular canned tuna, andsardines. Salted nuts. Dairy Processed cheese and cheese spreads. Cheese curds. Blue cheese. Feta cheese.String cheese. Regular cottage cheese. Buttermilk. Canned milk. The items listed above may not be a complete list of foods high in sodium. Actual amounts of sodium may be different depending on processing. Contact a dietitian for more information. What foods are low in sodium? Fruits Fresh, frozen, or canned fruit with no sauce added. Fruit juice. Vegetables Fresh or frozen vegetables with no sauce added. "No salt added" canned vegetables. "No salt added" tomato sauce and paste. Low-sodium orreduced-sodium tomato and vegetable juice. Grains Noodles, pasta, quinoa, rice. Shredded or puffed wheat or puffed rice. Regular or quick oats (not instant). Low-sodium crackers. Low-sodium bread. Whole-grainbread and whole-grain pasta. Unsalted popcorn. Meats and other proteins Fresh or frozen whole meats, poultry (not injected with sodium), and fish with no sauce added. Unsalted nuts. Dried peas, beans, and   lentils without added salt. Unsalted canned beans. Eggs. Unsalted nut butters. Low-sodium canned  tunaor chicken. Dairy Milk. Soy milk. Yogurt. Low-sodium cheeses, such as Swiss, Monterey Jack, mozzarella, and ricotta. Sherbet or ice cream (keep to  cup per serving).Cream cheese. Fats and oils Unsalted butter or margarine. Other foods Homemade pudding. Sodium-free baking soda and baking powder. Herbs and spices.Low-sodium seasoning mixes. Beverages Coffee and tea. Carbonated beverages. The items listed above may not be a complete list of foods low in sodium. Actual amounts of sodium may be different depending on processing. Contact a dietitian for more information. What are some salt alternatives when cooking? The following are herbs, seasonings, and spices that can be used instead of salt to flavor your food. Herbs should be fresh or dried. Do not choose packaged mixes. Next to the name of the herb, spice, or seasoning aresome examples of foods you can pair it with. Herbs [place] leaves - Soups, meat and vegetable dishes, and spaghetti sauce. Basil - Italian dishes, soups, pasta, and fish dishes. Cilantro - Meat, poultry, and vegetable dishes. Chili powder - Marinades and Mexican dishes. Chives - Salad dressings and potato dishes. Cumin - Mexican dishes, couscous, and meat dishes. Dill - Fish dishes, sauces, and salads. Fennel - Meat and vegetable dishes, breads, and cookies. Garlic (do not use garlic salt) - Italian dishes, meat dishes, salad dressings, and sauces. Marjoram - Soups, potato dishes, and meat dishes. Oregano - Pizza and spaghetti sauce. Parsley - Salads, soups, pasta, and meat dishes. Rosemary - Italian dishes, salad dressings, soups, and red meats. Saffron - Fish dishes, pasta, and some poultry dishes. Sage - Stuffings and sauces. Tarragon - Fish and poultry dishes. Thyme - Stuffing, meat, and fish dishes. Seasonings Lemon juice - Fish dishes, poultry dishes, vegetables, and salads. Vinegar - Salad dressings, vegetables, and fish dishes. Spices Cinnamon - Sweet  dishes, such as cakes, cookies, and puddings. Cloves - Gingerbread, puddings, and marinades for meats. Curry - Vegetable dishes, fish and poultry dishes, and stir-fry dishes. Ginger - Vegetable dishes, fish dishes, and stir-fry dishes. Nutmeg - Pasta, vegetables, poultry, fish dishes, and custard. Summary Cooking with less salt is one way to reduce the amount of sodium that you get from food. Buy sodium-free or low-sodium products. Check the food label before using or buying packaged ingredients. Use herbs, seasonings without salt, and spices as substitutes for salt in foods. This information is not intended to replace advice given to you by your health care provider. Make sure you discuss any questions you have with your healthcare provider. Document Revised: 08/14/2019 Document Reviewed: 08/14/2019 Elsevier Patient Education  2022 [employer]  

## 2021-03-16 NOTE — Progress Notes (Signed)
I,Katawbba Wiggins,acting as a Education administrator for [name], MD.,have documented all relevant documentation on the behalf of [name], MD,as directed by  [name], MD while in the presence of [name], MD.  This visit occurred during the SARS-CoV-2 public health emergency.  Safety protocols were in place, including screening questions prior to the visit, additional usage of staff PPE, and extensive cleaning of exam room while observing appropriate contact time as indicated for disinfecting solutions.  Subjective:     Patient ID: [name] , female    DOB: December 18, 1955 , 65 y.o.   MRN: [id number]   Chief Complaint  Patient presents with   Hypertension    HPI  She presents today for BP check.  She reports compliance with meds. She denies headaches, chest pain and shortness of breath.   Hypertension This is a chronic problem. The current episode started more than 1 year ago. The problem has been gradually improving since onset. The problem is controlled. Pertinent negatives include no blurred vision, chest pain, headaches, palpitations or shortness of breath. Risk factors for coronary artery disease include post-menopausal state and sedentary lifestyle. The current treatment provides moderate improvement. Compliance problems include exercise.     Past Medical History:  Diagnosis Date   Abnormal gait    Amnesia    Anemia    hx   Aneurysm (HCC)    Anxiety    Asthma    Atrophic vaginitis    Benign hypertensive heart disease    Cardiomegaly    Chest pain    COPD (chronic obstructive pulmonary disease) (HCC)     - PFTs  01/24/06 FEV1 69% ratio 68% diffusing capacity 57% with no improvement after B2   - PFT's 10/1/ 09 FEV1 77   ratio  76    dlco                   45            no resp to B2    - Nl alpha one antitripsin level 10/09   Cough    Depression    Difficulty speaking    Disorder of nasal cavity    Dyspnea    Enthesopathy of elbow region    GERD  (gastroesophageal reflux disease)    Hammer toe    Headache(784.0)    Hemiparesis affecting dominant side as late effect of cerebrovascular accident ([place])    History of kidney stones    Hyperlipidemia    Hypertension    Hypothyroidism    Increased urinary frequency    Low compliance bladder    Macular degeneration of both eyes    Malaise and fatigue    Nonruptured cerebral aneurysm    Obesity    Pneumonia    hx   Pure hypercholesterolemia    Recurrent major depressive episodes (HCC)    Seizure disorder, secondary ([place])    2-3 focal siezures monthly   Seizures ([place])    Short-term memory loss    since stroke   Skin sensation disturbance    Stroke [facility]) 2011   rt sided weakness   Swelling of limb    TMJ (temporomandibular joint disorder)    Urinary tract infectious disease    Vertigo as late effect of stroke    Vitamin D deficiency      Family History  Problem Relation Age of Onset   Heart disease Mother    Heart disease Father  Heart attack Father    Cancer Father        prostate to lymph node   Lung cancer Other    Breast cancer Neg Hx      Current Outpatient Medications:    albuterol (PROVENTIL) (2.5 MG/3ML) 0.083% nebulizer solution, Take 3 mLs (2.5 mg total) by nebulization every 6 (six) hours., Disp: 1080 mL, Rfl: 1   albuterol (VENTOLIN HFA) 108 (90 Base) MCG/ACT inhaler, Inhale 2 puffs into the lungs every 6 (six) hours as needed for wheezing or shortness of breath., Disp: 24 g, Rfl: 3   ALPRAZolam (XANAX) 0.25 MG tablet, Take 1 tablet (0.25 mg total) by mouth 2 (two) times daily as needed., Disp: 60 tablet, Rfl: 2   APPLE CIDER VINEGAR PO, Take by mouth. 2 gummies per day, Disp: , Rfl:    aspirin EC 81 MG tablet, Take 81 mg by mouth daily., Disp: , Rfl:    benzonatate (TESSALON) 200 MG capsule, Take 1 capsule (200 mg total) by mouth 3 (three) times daily as needed for cough., Disp: 90 capsule, Rfl: 0   Biotin 1 MG CAPS, Take by mouth., Disp: , Rfl:     Cholecalciferol (VITAMIN D-3) 1000 units CAPS, Take 1,000 Units by mouth daily., Disp: , Rfl:    citalopram (CELEXA) 40 MG tablet, TAKE 1 TABLET AT BEDTIME, Disp: 90 tablet, Rfl: 3   clopidogrel (PLAVIX) 75 MG tablet, TAKE 1 TABLET DAILY, Disp: 90 tablet, Rfl: 3   CRANBERRY PO, Take 2 tablets by mouth 2 (two) times daily., Disp: , Rfl:    diclofenac Sodium (VOLTAREN) 1 % GEL, Voltaren 1 % topical gel  APPLY 2 GRAM TO THE AFFECTED AREA(S) BY TOPICAL ROUTE 4 TIMES PER DAY, Disp: 350 g, Rfl: 2   fluticasone (FLONASE) 50 MCG/ACT nasal spray, Place 2 sprays into both nostrils daily as needed (allergies or post-nasl drip). , Disp: , Rfl: 3   levETIRAcetam (KEPPRA) 500 MG tablet, Take 1 tablet (500 mg total) by mouth every 12 (twelve) hours., Disp: 180 tablet, Rfl: 3   levothyroxine (SYNTHROID) 50 MCG tablet, TAKE 1 TABLET ON MONDAY THROUGH [facility] (Patient taking differently: No sig reported), Disp: 39 tablet, Rfl: 3   levothyroxine (SYNTHROID) 75 MCG tablet, TAKE 1 TABLET IN THE MORNING BEFORE BREAKFAST ON THURSDAY THROUGH SUNDAY (TAKE 50 MCG ON OTHER DAYS) (Patient taking differently: TAKE 1 TABLET IN THE MORNING BEFORE BREAKFAST ON Monday through Firday (TAKE 50 MCG ON OTHER DAYS)), Disp: 51 tablet, Rfl: 3   Multiple Vitamins-Minerals (PRESERVISION/LUTEIN PO), Take by mouth. , Disp: , Rfl:    NON FORMULARY, preser vision 2 tabs daily, Disp: , Rfl:    NONFORMULARY OR COMPOUNDED ITEM, Apply 240 mg topically daily as needed., Disp: 1 each, Rfl: 3   Omega-3 Fatty Acids (FISH OIL) 1000 MG CAPS, Take 2,000 mg by mouth 2 (two) times daily. , Disp: , Rfl:    rosuvastatin (CRESTOR) 20 MG tablet, Take 1 tablet (20 mg total) by mouth daily., Disp: 90 tablet, Rfl: 3   telmisartan (MICARDIS) 20 MG tablet, TAKE 1 TABLET DAILY, Disp: 90 tablet, Rfl: 3   vitamin C (ASCORBIC ACID) 500 MG tablet, Take 500 mg by mouth daily., Disp: , Rfl:    vitamin E 1000 UNIT capsule, Take 1,000 Units by mouth at bedtime., Disp: ,  Rfl:    cyclobenzaprine (FLEXERIL) 10 MG tablet, Take 1 tablet (10 mg total) by mouth 3 (three) times daily as needed., Disp: 90 tablet, Rfl: 5  metoprolol tartrate (LOPRESSOR) 100 MG tablet, Take 1 tablet by mouth 90-120 minutes before your cardiac ct, Disp: 1 tablet, Rfl: 0   Allergies  Allergen Reactions   Phenytoin Anaphylaxis, Hives and Swelling     Review of Systems  Constitutional: Negative.   Eyes:  Negative for blurred vision.  Respiratory: Negative.  Negative for shortness of breath.   Cardiovascular: Negative.  Negative for chest pain and palpitations.  Gastrointestinal: Negative.   Genitourinary:  Positive for frequency.  Neurological: Negative.  Negative for headaches.  Psychiatric/Behavioral: Negative.      Today's Vitals   03/16/21 1018  BP: 110/60  Pulse: 90  Temp: 98 F (36.7 C)  TempSrc: Oral  Weight: 157 lb 6.4 oz (71.4 kg)  Height: $Remove'5\' 2"'MViEdqr$  (1.575 m)  PainSc: 5   PainLoc: Generalized   Body mass index is 28.79 kg/m.  Wt Readings from Last 3 Encounters:  03/29/21 160 lb (72.6 kg)  03/24/21 159 lb (72.1 kg)  03/16/21 157 lb 6.4 oz (71.4 kg)    BP Readings from Last 3 Encounters:  04/01/21 124/77  03/29/21 129/81  03/24/21 120/82    Objective:  Physical Exam Vitals and nursing note reviewed.  Constitutional:      Appearance: Normal appearance.  HENT:     Head: Normocephalic and atraumatic.     Nose:     Comments: Masked     Mouth/Throat:     Comments: Masked  Eyes:     Extraocular Movements: Extraocular movements intact.  Cardiovascular:     Rate and Rhythm: Normal rate and regular rhythm.     Heart sounds: Normal heart sounds.  Pulmonary:     Effort: Pulmonary effort is normal.     Breath sounds: Normal breath sounds.  Musculoskeletal:     Cervical back: Normal range of motion.  Skin:    General: Skin is warm.  Neurological:     General: No focal deficit present.     Mental Status: She is alert.  Psychiatric:        Mood and  Affect: Mood normal.        Behavior: Behavior normal.        Assessment And Plan:     1. Hypertensive heart disease without heart failure Comments: Chronic, controlled. She will c/w current meds. I will check renal function today. She agrees to f/u in six months.  - CMP14+EGFR  2. Atherosclerosis of aorta ([place]) Comments: Chronic, she is encouraged to follow a heart healthy lifestyle. Importance of statin compliance was d/w pt.   3. Other abnormal glucose Comments: Her a1c has been elevated in the past. I will recheck an a1c today, encouraged to limit her intake of sweetened beverages, including diet drinks.   4. Exudative age-related macular degeneration of right eye with active choroidal neovascularization ([place]) Comments: Chronic, as per Ophthalmology.   5. Urinary frequency Comments: I will check urinalysis. She has recurrent UTI.  - POCT Urinalysis Dipstick (81002)  6. Acquired hypothyroidism Comments: I will check thyroid panel and adjust meds as needed.  - TSH + free T4  7. Immunization due  She will given Pneumovax-23 IM x 1.   Patient was given opportunity to ask questions. Patient verbalized understanding of the plan and was able to repeat key elements of the plan. All questions were answered to their satisfaction.   I, [name], MD, have reviewed all documentation for this visit. The documentation on 03/16/21 for the exam, diagnosis, procedures, and orders are all  accurate and complete.   IF YOU HAVE BEEN REFERRED TO A SPECIALIST, IT MAY TAKE 1-2 WEEKS TO SCHEDULE/PROCESS THE REFERRAL. IF YOU HAVE NOT HEARD FROM US/SPECIALIST IN TWO WEEKS, PLEASE GIVE [place] A CALL AT [phone] X [phone].   THE PATIENT IS ENCOURAGED TO PRACTICE SOCIAL DISTANCING DUE TO THE COVID-19 PANDEMIC.

## 2021-03-17 LAB — CMP14+EGFR
ALT: 24 IU/L (ref 0–32)
AST: 25 IU/L (ref 0–40)
Albumin/Globulin Ratio: 2.1 (ref 1.2–2.2)
Albumin: 4.6 g/dL (ref 3.8–4.8)
Alkaline Phosphatase: 110 IU/L (ref 44–121)
BUN/Creatinine Ratio: 25 (ref 12–28)
BUN: 17 mg/dL (ref 8–27)
Bilirubin Total: 0.6 mg/dL (ref 0.0–1.2)
CO2: 23 mmol/L (ref 20–29)
Calcium: 9.5 mg/dL (ref 8.7–10.3)
Chloride: 98 mmol/L (ref 96–106)
Creatinine, Ser: 0.69 mg/dL (ref 0.57–1.00)
Globulin, Total: 2.2 g/dL (ref 1.5–4.5)
Glucose: 96 mg/dL (ref 65–99)
Potassium: 4.6 mmol/L (ref 3.5–5.2)
Sodium: 138 mmol/L (ref 134–144)
Total Protein: 6.8 g/dL (ref 6.0–8.5)
eGFR: 97 mL/min/{1.73_m2} (ref >59)

## 2021-03-17 LAB — TSH+FREE T4
Free T4: 0.93 ng/dL (ref 0.82–1.77)
TSH: 7.32 u[IU]/mL — ABNORMAL HIGH (ref 0.450–4.500)

## 2021-03-20 ENCOUNTER — Encounter: Payer: Self-pay | Admitting: Internal Medicine

## 2021-03-24 ENCOUNTER — Other Ambulatory Visit: Payer: Self-pay

## 2021-03-24 ENCOUNTER — Ambulatory Visit (INDEPENDENT_AMBULATORY_CARE_PROVIDER_SITE_OTHER): Payer: Medicare Other | Admitting: Family

## 2021-03-24 ENCOUNTER — Telehealth: Payer: Self-pay | Admitting: Cardiology

## 2021-03-24 ENCOUNTER — Encounter (HOSPITAL_BASED_OUTPATIENT_CLINIC_OR_DEPARTMENT_OTHER): Payer: Self-pay | Admitting: Family

## 2021-03-24 VITALS — BP 120/82 | HR 85 | Ht 62.0 in | Wt 159.0 lb

## 2021-03-24 DIAGNOSIS — I2510 Atherosclerotic heart disease of native coronary artery without angina pectoris: Secondary | ICD-10-CM | POA: Diagnosis not present

## 2021-03-24 DIAGNOSIS — I700 Atherosclerosis of aorta: Secondary | ICD-10-CM

## 2021-03-24 DIAGNOSIS — Z8673 Personal history of transient ischemic attack (TIA), and cerebral infarction without residual deficits: Secondary | ICD-10-CM

## 2021-03-24 DIAGNOSIS — I10 Essential (primary) hypertension: Secondary | ICD-10-CM

## 2021-03-24 DIAGNOSIS — E785 Hyperlipidemia, unspecified: Secondary | ICD-10-CM | POA: Diagnosis not present

## 2021-03-24 DIAGNOSIS — I200 Unstable angina: Secondary | ICD-10-CM

## 2021-03-24 MED ORDER — METOPROLOL TARTRATE 100 MG PO TABS: ORAL_TABLET | ORAL | 0 refills | Status: DC

## 2021-03-24 NOTE — Patient Instructions (Addendum)
Medication Instructions:  Continue your current medications.   Take Metoprolol Tartrate 100mg  1 1/2 - 2 hours prior to your cardiac CTA  *If you need a refill on your cardiac medications before your next appointment, please call your pharmacy*   Lab Work: None ordered today.   If you have labs (blood work) drawn today and your tests are completely normal, you will receive your results only by: [place] (if you have MyChart) OR A paper copy in the mail If you have any lab test that is abnormal or we need to change your treatment, we will call you to review the results.   Testing/Procedures: Your physician has requested that you have cardiac CT. Cardiac computed tomography (CT) is a painless test that uses an x-ray machine to take clear, detailed pictures of your heart. . Please follow instruction sheet BELOW:     Your cardiac CT will be scheduled at one of the below locations:   [facility] [address] [place], [place] [address] (336) [facility], please arrive at the [facility] main entrance (entrance A) of [facility] 30 minutes prior to test start time. Proceed to the [facility] Radiology Department (first floor) to check-in and test prep.  Please follow these instructions carefully (unless otherwise directed):  On the Night Before the Test: Be sure to Drink plenty of water. Do not consume any caffeinated/decaffeinated beverages or chocolate 12 hours prior to your test. Do not take any antihistamines 12 hours prior to your test.   On the Day of the Test: Drink plenty of water until 1 hour prior to the test. Do not eat any food 4 hours prior to the test. You may take your regular medications prior to the test.  Take metoprolol (Lopressor) 1 1/2 - 2 hours prior to test. FEMALES- please wear underwire-free bra if available, avoid dresses & tight clothing       After the Test: Drink plenty of water. After receiving  IV contrast, you may experience a mild flushed feeling. This is normal. On occasion, you may experience a mild rash up to 24 hours after the test. This is not dangerous. If this occurs, you can take Benadryl 25 mg and increase your fluid intake. If you experience trouble breathing, this can be serious. If it is severe call 911 IMMEDIATELY. If it is mild, please call our office. If you take any of these medications: Glipizide/Metformin, Avandament, Glucavance, please do not take 48 hours after completing test unless otherwise instructed.  Please allow 2-4 weeks for scheduling of routine cardiac CTs. Some insurance companies require a pre-authorization which may delay scheduling of this test.   For non-scheduling related questions, please contact the cardiac imaging nurse navigator should you have any questions/concerns: Marchia Bond, Cardiac Imaging Nurse Navigator Gordy Clement, Cardiac Imaging Nurse Navigator [facility] Direct Office Dial: [phone]   For scheduling needs, including cancellations and rescheduling, please call [place], [phone]. .   Follow-Up: At [facility], you and your health needs are our priority.  As part of our continuing mission to provide you with exceptional heart care, we have created designated Provider Care Teams.  These Care Teams include your primary Cardiologist (physician) and Advanced Practice Providers (APPs -  Physician Assistants and Nurse Practitioners) who all work together to provide you with the care you need, when you need it.  We recommend signing up for the patient portal called "MyChart".  Sign up  information is provided on this After Visit Summary.  MyChart is used to connect with patients for Virtual Visits (Telemedicine).  Patients are able to view lab/test results, encounter notes, upcoming appointments, etc.  Non-urgent messages can be sent to your provider as well.   To learn more about what you can do  with MyChart, go to [email or web address].    Your next appointment:   4-6 week(s)  The format for your next appointment:   In Person  Provider:   Buford Dresser, MD or Loel Dubonnet, NP    Other Instructions  To prevent or reduce lower extremity swelling: Eat a low salt diet. Salt makes the body hold onto extra fluid which causes swelling. Sit with legs elevated. For example, in the recliner or on an [place].  Wear knee-high compression stockings during the daytime. Ones labeled 15-20 mmHg provide good compression.

## 2021-03-24 NOTE — Telephone Encounter (Signed)
Pt is calling her CT is scheduled for 7/28 and her medication will not get to her by then she wants to know what to do

## 2021-03-24 NOTE — Telephone Encounter (Signed)
Called patient back, her Metoprolol was sent to [facility]- she states it wont be ready in time. I advised I would send to a local pharmacy.   Patient verbalized understanding, thankful for call back.

## 2021-03-24 NOTE — Progress Notes (Signed)
Office Visit    Patient Name: [name] Date of Encounter: 03/24/2021  PCP:  Glendale Chard, [place]  Cardiologist:  Buford Dresser, MD  Advanced Practice Provider:  No care team member to display Electrophysiologist:  None    Chief Complaint    [name] is a 65 y.o. female with a hx of brain aneurysm, aortic atherosclerosis, coronary artery atherosclerosis on CT, hypertension, HLD, CVA, former tobacco use, COPD, prior ICH  presents today for left arm numbness    Past Medical History    Past Medical History:  Diagnosis Date   Abnormal gait    Amnesia    Anemia    hx   Aneurysm ([place])    Anxiety    Asthma    Atrophic vaginitis    Benign hypertensive heart disease    Cardiomegaly    Chest pain    COPD (chronic obstructive pulmonary disease) ([place])     - PFTs  01/24/06 FEV1 69% ratio 68% diffusing capacity 57% with no improvement after B2   - PFT's 10/1/ 09 FEV1 77   ratio  76    dlco                   45            no resp to B2    - Nl alpha one antitripsin level 10/09   Cough    Depression    Difficulty speaking    Disorder of nasal cavity    Dyspnea    Enthesopathy of elbow region    GERD (gastroesophageal reflux disease)    Hammer toe    Headache(784.0)    Hemiparesis affecting dominant side as late effect of cerebrovascular accident ([place])    History of kidney stones    Hyperlipidemia    Hypertension    Hypothyroidism    Increased urinary frequency    Low compliance bladder    Macular degeneration of both eyes    Malaise and fatigue    Nonruptured cerebral aneurysm    Obesity    Pneumonia    hx   Pure hypercholesterolemia    Recurrent major depressive episodes ([place])    Seizure disorder, secondary ([place])    2-3 focal siezures monthly   Seizures ([place])    Short-term memory loss    since stroke   Skin sensation disturbance    Stroke [facility]) 2011   rt sided weakness   Swelling of limb    TMJ  (temporomandibular joint disorder)    Urinary tract infectious disease    Vertigo as late effect of stroke    Vitamin D deficiency    Past Surgical History:  Procedure Laterality Date   BACK SURGERY     CEREBRAL ANGIOGRAM     x3   COLONOSCOPY WITH PROPOFOL N/A 08/18/2016   Procedure: COLONOSCOPY WITH PROPOFOL;  Surgeon: Carol Ada, MD;  Location: [facility];  Service: Endoscopy;  Laterality: N/A;   LAPAROSCOPIC HYSTERECTOMY     MANDIBLE FRACTURE SURGERY     RADIOLOGY WITH ANESTHESIA N/A 08/21/2013   Procedure: RADIOLOGY WITH ANESTHESIA;  Surgeon: Rob Hickman, MD;  Location: [place] [facility];  Service: Radiology;  Laterality: N/A;   stones     no surgery    Allergies  Allergies  Allergen Reactions   Phenytoin Anaphylaxis, Hives and Swelling    History of Present Illness    [name] is a 65 y.o. female  with a hx of brain aneurysm, aortic atherosclerosis, coronary artery atherosclerosis on CT, hypertension, CVA, HLD, former tobacco use, COPD, prior ICH last seen 10/29/20 by Dr. Harrell Gave.  She follows with Dr. Harrell Gave given history of CVA, aortic atherosclerosis, and hyperlipidemia. At last clinic visit 10/29/20 she was increased from Rosuvastatin 10mg  to 20mg  for further secondary prevention.   Notes left arm numbness that starts under her left armpit and goes down to her fingertips. Initial episode a couple months ago. She was concerned regarding to her family history of heart disease as her father had open heart surgery, mother with heart murmur. Tells me numbness occurs "every once and awhile". Tells me the episodes occur two or three times per week and last 30 minutes to an hour. Occur with rest. She has tried stretching with no improvement. Some posterior neck discomfort. She also notes a sensation of tightnes sunder her left breast that occurs intermittently. No noted aggravating nor relieving factors.   She has an upcoming appointment with pulmonology the  first weekn of September. Notes her breathing is stable. She gets short of breath with exertion which is unchanged. No edema, orthopnea, PND. Reports no palpitations.  No lightheadedness, dizziness.   EKGs/Labs/Other Studies Reviewed:   The following studies were reviewed today:  Lexiscan myoview 04/29/20 The left ventricular ejection fraction is hyperdynamic (>65%). Nuclear stress EF: 73%. The TID is increased at 1.34 but visually there does not appear to be transient ischemic dilatation. There was no ST segment deviation noted during stress. The perfusion study is normal. This is a low risk study.   Echo 01/30/14 Left ventricle: The cavity size was normal. Wall thickness was    normal. Systolic function was normal. The estimated ejection    fraction was in the range of 60% to 65%. Wall motion was normal;    there were no regional wall motion abnormalities. Doppler    parameters are consistent with abnormal left ventricular    relaxation (grade 1 diastolic dysfunction). The E/e&' ratio is <8,    suggesting normal LV filling pressure.  - Left atrium: The atrium was normal in size.  - Pericardium, extracardiac: There is a heterogenous mass inferior    to the RV which extends toward the apex and compresses the RV -    this is similar in appearance to liver and may represent    hepatomegaly or perhaps mass - consider dedicated    abdominal/thoracic imaging. There is the appearance of possible    mass invading the IVC in image 71. There was no pericardial    effusion.   Impressions:   - No evidence for cardiomegaly. Heterogenous mass comressing the RV    and possibly invading the IVC seen in subcostal images. Recommend    dedicated thoracoabdominal imaging.   CT angio chest/abdomen 02/14/14 IMPRESSION:  1. No aortic dissection.  No aneurysm.  2. No acute findings in the chest abdomen.  3. No abdominal mass.  EKG:  EKG is  ordered today.  The ekg ordered today demonstrates NSR 85  bpm with no acute ST/T wave changes.   Recent Labs: 09/16/2020: Hemoglobin 13.1; Platelets 225 03/16/2021: ALT 24; BUN 17; Creatinine, Ser 0.69; Potassium 4.6; Sodium 138; TSH 7.320  Recent Lipid Panel    Component Value Date/Time   CHOL 153 09/16/2020 1429   TRIG 95 09/16/2020 1429   HDL 71 09/16/2020 1429   CHOLHDL 2.2 09/16/2020 1429   CHOLHDL 2.8 11/23/2009 0435   VLDL 15 11/23/2009 0435  LDLCALC 65 09/16/2020 1429    Home Medications   Current Meds  Medication Sig   albuterol (PROVENTIL) (2.5 MG/3ML) 0.083% nebulizer solution Take 3 mLs (2.5 mg total) by nebulization every 6 (six) hours.   albuterol (VENTOLIN HFA) 108 (90 Base) MCG/ACT inhaler Inhale 2 puffs into the lungs every 6 (six) hours as needed for wheezing or shortness of breath.   ALPRAZolam (XANAX) 0.25 MG tablet Take 1 tablet (0.25 mg total) by mouth 2 (two) times daily as needed.   APPLE CIDER VINEGAR PO Take by mouth. 2 gummies per day   aspirin EC 81 MG tablet Take 81 mg by mouth daily.   benzonatate (TESSALON) 200 MG capsule Take 1 capsule (200 mg total) by mouth 3 (three) times daily as needed for cough.   Biotin 1 MG CAPS Take by mouth.   Cholecalciferol (VITAMIN D-3) 1000 units CAPS Take 1,000 Units by mouth daily.   citalopram (CELEXA) 40 MG tablet TAKE 1 TABLET AT BEDTIME   clopidogrel (PLAVIX) 75 MG tablet TAKE 1 TABLET DAILY   CRANBERRY PO Take 2 tablets by mouth 2 (two) times daily.   cyclobenzaprine (FLEXERIL) 10 MG tablet Take 1 tablet (10 mg total) by mouth at bedtime as needed.   diclofenac Sodium (VOLTAREN) 1 % GEL Voltaren 1 % topical gel  APPLY 2 GRAM TO THE AFFECTED AREA(S) BY TOPICAL ROUTE 4 TIMES PER DAY   fluticasone (FLONASE) 50 MCG/ACT nasal spray Place 2 sprays into both nostrils daily as needed (allergies or post-nasl drip).    levETIRAcetam (KEPPRA) 500 MG tablet Take 1 tablet (500 mg total) by mouth every 12 (twelve) hours.   levothyroxine (SYNTHROID) 50 MCG tablet TAKE 1 TABLET ON  MONDAY THROUGH [facility] (Patient taking differently: TAKE 1 TABLET ON Saturday and Sunday)   levothyroxine (SYNTHROID) 75 MCG tablet TAKE 1 TABLET IN THE MORNING BEFORE BREAKFAST ON THURSDAY THROUGH SUNDAY (TAKE 50 MCG ON OTHER DAYS) (Patient taking differently: TAKE 1 TABLET IN THE MORNING BEFORE BREAKFAST ON Monday through Firday (TAKE 50 MCG ON OTHER DAYS))   Multiple Vitamins-Minerals (PRESERVISION/LUTEIN PO) Take by mouth.    NON FORMULARY preser vision 2 tabs daily   NONFORMULARY OR COMPOUNDED ITEM Apply 240 mg topically daily as needed.   Omega-3 Fatty Acids (FISH OIL) 1000 MG CAPS Take 2,000 mg by mouth 2 (two) times daily.    rosuvastatin (CRESTOR) 20 MG tablet Take 1 tablet (20 mg total) by mouth daily.   telmisartan (MICARDIS) 20 MG tablet TAKE 1 TABLET DAILY   vitamin C (ASCORBIC ACID) 500 MG tablet Take 500 mg by mouth daily.   vitamin E 1000 UNIT capsule Take 1,000 Units by mouth at bedtime.     Review of Systems      All other systems reviewed and are otherwise negative except as noted above.  Physical Exam    VS:  BP 120/82   Pulse 85   Ht 5\' 2"  (1.575 m)   Wt 159 lb (72.1 kg)   BMI 29.08 kg/m  , BMI Body mass index is 29.08 kg/m.  Wt Readings from Last 3 Encounters:  03/24/21 159 lb (72.1 kg)  03/16/21 157 lb 6.4 oz (71.4 kg)  11/17/20 161 lb 3.2 oz (73.1 kg)     GEN: Well nourished, well developed, in no acute distress. HEENT: normal. Neck: Supple, no JVD, carotid bruits, or masses. Cardiac: RRR, no murmurs, rubs, or gallops. No clubbing, cyanosis, edema.  Radials/PT 2+ and equal bilaterally.  Respiratory:  Respirations  regular and unlabored, clear to auscultation bilaterally. GI: Soft, nontender, nondistended. MS: No deformity or atrophy. Skin: Warm and dry, no rash. Neuro:  Strength and sensation are intact. Psych: Normal affect.  Assessment & Plan    Coronary artery calcification on CT / Aortic atherosclerosis / Left arm numbness - Known 3 vessel  disease by previous CT. Worsening left arm numbness and chest discomfort under the L chest wall. Plan for cardiac CTA to rule out ischemia as contributory to symptoms of chest pain. Anticipate her arm numbness is nerve injury and may need further imaging of shoulder or neck with her primary care provider. Her left sided chest discomfort is more concerning for angina given her risk factors and family history. Metoprolol tartrate 100mg  one time two hours prior to cardiac CTA. GDMT includes Rosuvastatin, Aspirin.   History of CVA - Continue optimal BP and lipid control.   HLD, LDL goal <70 - Continue Rosuvastatin 20mg  QD.   HTN - BP well controlled. Continue current antihypertensive regimen.     Disposition: Follow up in 4-6 week(s) with Dr. Harrell Gave or APP.  Signed, Loel Dubonnet, NP 03/24/2021, 9:46 AM [place]

## 2021-03-29 ENCOUNTER — Ambulatory Visit (INDEPENDENT_AMBULATORY_CARE_PROVIDER_SITE_OTHER): Payer: Medicare Other | Admitting: Adult Health

## 2021-03-29 ENCOUNTER — Encounter: Payer: Self-pay | Admitting: Adult Health

## 2021-03-29 VITALS — BP 129/81 | HR 90 | Ht 62.0 in | Wt 160.0 lb

## 2021-03-29 DIAGNOSIS — G8111 Spastic hemiplegia affecting right dominant side: Secondary | ICD-10-CM

## 2021-03-29 DIAGNOSIS — M542 Cervicalgia: Secondary | ICD-10-CM | POA: Diagnosis not present

## 2021-03-29 DIAGNOSIS — Z8673 Personal history of transient ischemic attack (TIA), and cerebral infarction without residual deficits: Secondary | ICD-10-CM | POA: Diagnosis not present

## 2021-03-29 DIAGNOSIS — G40909 Epilepsy, unspecified, not intractable, without status epilepticus: Secondary | ICD-10-CM | POA: Diagnosis not present

## 2021-03-29 DIAGNOSIS — I69398 Other sequelae of cerebral infarction: Secondary | ICD-10-CM

## 2021-03-29 DIAGNOSIS — R269 Unspecified abnormalities of gait and mobility: Secondary | ICD-10-CM | POA: Diagnosis not present

## 2021-03-29 MED ORDER — CYCLOBENZAPRINE HCL 10 MG PO TABS: 10.0000 mg | ORAL_TABLET | Freq: Three times a day (TID) | ORAL | 5 refills | Status: DC | PRN

## 2021-03-29 NOTE — Patient Instructions (Signed)
Referral placed to PT for continued gait and balance difficulties as well as right foot spasticity Increase Flexeril from twice daily to 3 times daily as needed (can continue '10mg'$  tab in the AM and take '20mg'$  (2 tabs) at bedtime)  You will be called to schedule cervical imaging which can contribute to neck pain, left arm symptoms and gait impairment  Continue to monitor eye fluttering and memory lapse symptoms - unknown cause - if worsens, we may need to consider increasing Keppra dosage to see if this helps -please keep me updated  I will look into order to obtain a scooter - we will keep you updated  Continue aspirin 81 mg daily and clopidogrel 75 mg daily  and Crestor for secondary stroke prevention  Continue to follow up with PCP regarding cholesterol and blood pressure management  Maintain strict control of hypertension with blood pressure goal below 130/90 and cholesterol with LDL cholesterol (bad cholesterol) goal below 70 mg/dL.       Followup in the future with me in 6 months or call earlier if needed       Thank you for coming to see [place] at [facility]. I hope we have been able to provide you high quality care today.  You may receive a patient satisfaction survey over the next few weeks. We would appreciate your feedback and comments so that we may continue to improve ourselves and the health of our patients.

## 2021-03-29 NOTE — Progress Notes (Signed)
[facility]  PATIENT: [name] DOB: [birth year]   REASON FOR VISIT: Follow-up for nontraumatic intracerebral hemorrhage, abnormality of gait, right foot drop, complex partial seizure disorder, and vascular headaches   Chief complaint: Chief Complaint  Patient presents with   Follow-up    RM 2 with spouse [name] Pt is well, no new stroke symptoms. its getting harder for her walk States she has a "flutter" were she forgets what she is doing but not sure if its seizure related      HISTORY OF PRESENT ILLNESS:   [name] is a 65 y.o. 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  Returns today 03/29/2021, for 25-monthfollow-up after prior visit 09/29/2020 accompanied by her husband  Stoke: Stable without new stroke/TIA symptoms and residual right spastic hemiparesis and gait impairment.  She did receive tibial nerve block by Dr. KLetta Pateon 09/29/2020 - helped to relax big toe but made other toes curl - lasted approx 1 month.  She does report a slight decline in gait and balance -continues to use cane and denies any recent falls.  She previously used electric wheelchair for long distance ambulation but finds this difficult to use in the yard or with specific activities.  She questions possible use of scooter. reports compliance on aspirin, Plavix and Crestor without associated side effects.  Blood pressure today 129/81 - does not routinely monitor as typically stable.   Seizures:  Remains on Keppra 500 mg twice daily without associated side effects. Reports "eye fluttering" when going to do something and will forget what she was just about to do - only lasts just a few seconds then subsides. Denies loss  of consciousness, visual changes or any other associated symptoms. Can occur 2-3 times per week.    Chronic migraines associated with tension headaches: Greatly improved after working with PT and receiving dry needling but did not last for long period of time. Headaches improved since prior visit but still present.  She has not had any additional migraine headaches.  She continues to experience L>R sided neck pain as well as recently experiencing left arm, breast and jaw numbness/pain - this has since improved.  Currently being worked up by cardiology to rule out cardiac source.  She has not previously had cervical imaging.       REVIEW OF SYSTEMS: Full 14 system review of systems performed and notable only for those listed, all others are neg:  Constitutional: neg Cardiovascular: neg Ear/Nose/Throat: neg  Skin: neg Eyes: neg Respiratory: neg Gastroitestinal: neg  Hematology/Lymphatic: neg Endocrine: neg Musculoskeletal: Walking difficulty, neck pain Allergy/Immunology: neg Neurological: Weakness, spasticity, headache, seizures, left arm numbness Psychiatric: neg Sleep : neg   ALLERGIES: Allergies  Allergen Reactions   Phenytoin Anaphylaxis, Hives and Swelling    HOME MEDICATIONS: Outpatient Medications Prior to Visit  Medication Sig Dispense Refill   albuterol (PROVENTIL) (2.5 MG/3ML) 0.083% nebulizer solution Take 3 mLs (2.5 mg total) by nebulization every 6 (six) hours. 1080 mL 1   albuterol (VENTOLIN HFA) 108 (90 Base) MCG/ACT inhaler Inhale 2 puffs into the lungs every 6 (six) hours as needed for wheezing or shortness of breath. 24 g 3   ALPRAZolam (XANAX) 0.25 MG tablet Take 1 tablet (0.25 mg total) by mouth 2 (two) times daily as needed. 60 tablet 2  APPLE CIDER VINEGAR PO Take by mouth. 2 gummies per day     aspirin EC 81 MG tablet Take 81 mg by mouth daily.     benzonatate (TESSALON) 200 MG capsule Take 1 capsule (200 mg total) by mouth 3 (three) times daily as  needed for cough. 90 capsule 0   Biotin 1 MG CAPS Take by mouth.     Cholecalciferol (VITAMIN D-3) 1000 units CAPS Take 1,000 Units by mouth daily.     citalopram (CELEXA) 40 MG tablet TAKE 1 TABLET AT BEDTIME 90 tablet 3   clopidogrel (PLAVIX) 75 MG tablet TAKE 1 TABLET DAILY 90 tablet 3   CRANBERRY PO Take 2 tablets by mouth 2 (two) times daily.     cyclobenzaprine (FLEXERIL) 10 MG tablet Take 1 tablet (10 mg total) by mouth at bedtime as needed. 90 tablet 3   diclofenac Sodium (VOLTAREN) 1 % GEL Voltaren 1 % topical gel  APPLY 2 GRAM TO THE AFFECTED AREA(S) BY TOPICAL ROUTE 4 TIMES PER DAY 350 g 2   fluticasone (FLONASE) 50 MCG/ACT nasal spray Place 2 sprays into both nostrils daily as needed (allergies or post-nasl drip).   3   levETIRAcetam (KEPPRA) 500 MG tablet Take 1 tablet (500 mg total) by mouth every 12 (twelve) hours. 180 tablet 3   levothyroxine (SYNTHROID) 50 MCG tablet TAKE 1 TABLET ON MONDAY THROUGH [facility] (Patient taking differently: No sig reported) 39 tablet 3   levothyroxine (SYNTHROID) 75 MCG tablet TAKE 1 TABLET IN THE MORNING BEFORE BREAKFAST ON THURSDAY THROUGH "SUNDAY (TAKE 50 MCG ON OTHER DAYS) (Patient taking differently: TAKE 1 TABLET IN THE MORNING BEFORE BREAKFAST ON Monday through Firday (TAKE 50 MCG ON OTHER DAYS)) 51 tablet 3   metoprolol tartrate (LOPRESSOR) 100 MG tablet Take 1 tablet by mouth 90-120 minutes before your cardiac ct 1 tablet 0   Multiple Vitamins-Minerals (PRESERVISION/LUTEIN PO) Take by mouth.      NON FORMULARY preser vision 2 tabs daily     NONFORMULARY OR COMPOUNDED ITEM Apply 240 mg topically daily as needed. 1 each 3   Omega-3 Fatty Acids (FISH OIL) 1000 MG CAPS Take 2,000 mg by mouth 2 (two) times daily.      rosuvastatin (CRESTOR) 20 MG tablet Take 1 tablet (20 mg total) by mouth daily. 90 tablet 3   telmisartan (MICARDIS) 20 MG tablet TAKE 1 TABLET DAILY 90 tablet 3   vitamin C (ASCORBIC ACID) 500 MG tablet Take 500 mg by mouth daily.      vitamin E 1000 UNIT capsule Take 1,000 Units by mouth at bedtime.     No facility-administered medications prior to visit.    PAST MEDICAL HISTORY: Past Medical History:  Diagnosis Date   Abnormal gait    Amnesia    Anemia    hx   Aneurysm (HCC)    Anxiety    Asthma    Atrophic vaginitis    Benign hypertensive heart disease    Cardiomegaly    Chest pain    COPD (chronic obstructive pulmonary disease) (HCC)     - PFTs  01/24/06 FEV1 69% ratio 68% diffusing capacity 57% with no improvement after B2   - PFT's 10/1/ 09 FEV1 77   ratio  76    dlco                   45"$             no resp to B2    -  Nl alpha one antitripsin level 10/09   Cough    Depression    Difficulty speaking    Disorder of nasal cavity    Dyspnea    Enthesopathy of elbow region    GERD (gastroesophageal reflux disease)    Hammer toe    Headache(784.0)    Hemiparesis affecting dominant side as late effect of cerebrovascular accident [facility])    History of kidney stones    Hyperlipidemia    Hypertension    Hypothyroidism    Increased urinary frequency    Low compliance bladder    Macular degeneration of both eyes    Malaise and fatigue    Nonruptured cerebral aneurysm    Obesity    Pneumonia    hx   Pure hypercholesterolemia    Recurrent major depressive episodes ([place])    Seizure disorder, secondary ([place])    2-3 focal siezures monthly   Seizures ([place])    Short-term memory loss    since stroke   Skin sensation disturbance    Stroke [facility]) 2011   rt sided weakness   Swelling of limb    TMJ (temporomandibular joint disorder)    Urinary tract infectious disease    Vertigo as late effect of stroke    Vitamin D deficiency     PAST SURGICAL HISTORY: Past Surgical History:  Procedure Laterality Date   BACK SURGERY     CEREBRAL ANGIOGRAM     x3   COLONOSCOPY WITH PROPOFOL N/A 08/18/2016   Procedure: COLONOSCOPY WITH PROPOFOL;  Surgeon: Carol Ada, MD;  Location: [facility];  Service: Endoscopy;   Laterality: N/A;   LAPAROSCOPIC HYSTERECTOMY     MANDIBLE FRACTURE SURGERY     RADIOLOGY WITH ANESTHESIA N/A 08/21/2013   Procedure: RADIOLOGY WITH ANESTHESIA;  Surgeon: Rob Hickman, MD;  Location: [place];  Service: Radiology;  Laterality: N/A;   stones     no surgery    FAMILY HISTORY: Family History  Problem Relation Age of Onset   Heart disease Mother    Heart disease Father    Heart attack Father    Cancer Father        prostate to lymph node   Lung cancer Other    Breast cancer Neg Hx     SOCIAL HISTORY: Social History   Socioeconomic History   Marital status: Married    Spouse name: Jori Moll   Number of children: 2   Years of education: 14   Highest education level: Not on file  Occupational History   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    Drug use: No   Sexual activity: Not Currently  Other Topics Concern   Not on file  Social History Narrative   Patient lives at home with her husband Jori Moll).   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  Patient is on disability.    Patient has an Associates degree.   Patient is right-handed.   Patient drinks 2 cups of green tea most days.               Social Determinants of Health   Financial Resource Strain: Low Risk    Difficulty of Paying Living Expenses: Not hard at all  Food Insecurity: No Food Insecurity   Worried About Charity fundraiser in the Last Year: Never true   [place] in the Last  Year: Never true  Transportation Needs: No Transportation Needs   Lack of Transportation (Medical): No   Lack of Transportation (Non-Medical): No  Physical Activity: Inactive   Days of Exercise per Week: 0 days   Minutes of Exercise per Session: 0 min  Stress: No Stress Concern Present   Feeling of Stress : Not at all  Social Connections: Not on file  Intimate Partner Violence: Not on file     PHYSICAL EXAM  Vitals:   03/29/21 0937  BP: 129/81  Pulse: 90  Weight: 160 lb (72.6 kg)  Height: '5\' 2"'$  (1.575 m)    Body mass index is 29.26 kg/m.  Generalized: Well developed, pleasant middle-age Caucasian female, in no acute distress  Head: normocephalic and atraumatic,. Oropharynx benign  Neck: Supple, no carotid bruits  Cardiac: Regular rate rhythm, no murmur  Skin no rash few bruises Musculoskeletal: Mild tenderness to palpation over cervical paraspinal muscles. Full cervical range of motion with some discomfort looking side to side but no paresthesias or radiculopathy symptoms with neck movement  Neurological examination   Mentation: Alert oriented to time, place, history taking. Follows all commands speech and language fluent  Cranial nerve II-XII: Pupils were equal round reactive to light extraocular movements were full, visual field were full on confrontational test. Right lower facial weakness (most notable when smiling). hearing intact.  Uvula tongue midline. head turning and shoulder shrug were normal and symmetric.Tongue protrusion into cheek strength was normal. Motor: Full strength left upper and lower extremity. RUE 4/5 with spasticity; RLE: 4/5 hip flexor, knee extension and flexion; 3/5 ankle dorsiflexion and plantarflexion with increased tone distally Sensory: Touch and pinprick are normal on the left mildly decreased on the right, vibratory is decreased on the right lower extremity  Coordination: Impaired on the right normal on the left  Reflexes: Brisk reflexes on  right otherwise 1+ Gait and Station: Rising up from seated position without assistance, wide based hemiplegic gait right foot drop using  single-point cane with mild imbalance and occasionally veering towards left    DIAGNOSTIC DATA (LABS, IMAGING, TESTING) - I reviewed patient records, labs, notes, testing and imaging myself where available.  Lab Results  Component Value Date   WBC 6.9 09/16/2020   HGB 13.1 09/16/2020   HCT 41.4 09/16/2020   MCV 94 09/16/2020   PLT 225 09/16/2020      Component Value Date/Time   NA 138 03/16/2021 1120   K 4.6 03/16/2021 1120   CL 98 03/16/2021 1120   CO2 23 03/16/2021 1120   GLUCOSE 96 03/16/2021 1120   GLUCOSE 94 05/28/2015 1302   BUN 17 03/16/2021 1120   CREATININE 0.69 03/16/2021 1120   CALCIUM 9.5 03/16/2021 1120   PROT 6.8 03/16/2021 1120   ALBUMIN 4.6 03/16/2021 1120   AST 25 03/16/2021 1120   ALT 24 03/16/2021 1120   ALKPHOS 110 03/16/2021 1120   BILITOT 0.6 03/16/2021 1120   GFRNONAA 93  09/16/2020 1429   GFRAA 107 09/16/2020 1429   Lipid Panel     Component Value Date/Time   CHOL 153 09/16/2020 1429   TRIG 95 09/16/2020 1429   HDL 71 09/16/2020 1429   CHOLHDL 2.2 09/16/2020 1429   CHOLHDL 2.8 11/23/2009 0435   VLDL 15 11/23/2009 0435   LDLCALC 65 09/16/2020 1429   LABVLDL 17 09/16/2020 1429         ASSESSMENT AND PLAN 65 y.o. year old female  has a past medical history of left frontoparietal parenchymal hemorrhage March 2011 from PRES and malignant hypertension with residual right spastic hemiparesis and gait impairment. Chronic headaches with history of migraines.  Placement of pipeline stent across Left PICA aneurysm by Dr. Estanislado Pandy on 08/21/2013. History of seizure disorder on Keppra.    1.  Left frontoparietal hemorrhage 10/2009 -Residual deficits right spastic hemiparesis and gait impairment-limited benefit with Botox and tibial nerve block  -Referral placed to neuro rehab PT - her greatest concern is toes  curling - unsure if any type of brace/prosthetic device available or of benefit  -requests power operated scooter -inability to ambulate long distance due to residual stroke deficits and needs assistance/supervision to ambulate with use of cane  -Continue compounding cream: Diclofenac, baclofen, cyclobenzaprine, gabapentin and bupivacaine -Continue aspirin, Plavix and Crestor for secondary stroke prevention -Discussed secondary stroke prevention measures and importance of close PCP follow-up for aggressive stroke risk factor management including HTN with BP goal <130/90 and HLD with LDL below<70   2.  Seizure disorder -reports "eye fluttering" lasting for 2-3 seconds and then will lose trian of thought - unsure etiology or if related to known seizure disorder.  No other associated seizure type symptoms.  Recommend continuing to monitor and to record when events occur to further evaluate for possible triggers or contributing factors. -advised to call with any worsening of symptoms or reoccurring seizure activity -may need to consider EEG or Keppra dosage adjustment in the future -Continue Keppra 500 mg twice daily for seizure prophylaxis -refill provided    3.  Chronic headaches with migraine history 4.  Cervicalgia with radiculopathy 5.  Left arm numbness -Headaches and neck pain greatly improved after working with PT and dry needling but only lasted short duration -obtain MR cervical spine to rule out any underlying factors possibly contributing to continued neck pain and left arm numbness.  May also be contributing to gait impairment and continued tension type headaches. No reg flags per history or exam -Referral placed to PT for gait impairment and continued neck pain -Continue to follow with cardiology to rule out any contributing cardiac concerns regarding left arm numbness    Follow-up in 6 months or call earlier if needed   CC:  [place] provider: Dr. Henriette Combs, Bailey Mech, MD    I  spent 37 minutes of face-to-face and non-face-to-face time with patient and husband.  This included previsit chart review, lab review, study review, order entry, electronic health record documentation, patient education regarding history of hemorrhage with residual deficits, secondary stroke prevention measures and aggressive stroke risk factor management, history of seizures and ongoing use of AED, chronic headaches and neck pain, and answered all other questions to patient and husband satisfaction   Frann Rider, AGNP-BC  [facility] [address] [place], [place] [address]  Phone [phone] Fax [phone] Note: This document was prepared with digital dictation and possible smart phrase technology. Any transcriptional errors that result from this process are unintentional.

## 2021-03-30 NOTE — Progress Notes (Signed)
I agree with the above plan 

## 2021-03-31 ENCOUNTER — Telehealth (HOSPITAL_COMMUNITY): Payer: Self-pay | Admitting: *Deleted

## 2021-03-31 NOTE — Telephone Encounter (Signed)
Reaching out to patient to offer assistance regarding upcoming cardiac imaging study; pt verbalizes understanding of appt date/time, parking situation and where to check in, pre-test NPO status and medications ordered, and verified current allergies; name and call back number provided for further questions should they arise  Gordy Clement RN Navigator Cardiac Imaging [facility] [phone] office [phone] cell  Patient to take '100mg'$  metoprolol tartrate 2 hours prior to cardiac CT.

## 2021-04-01 ENCOUNTER — Ambulatory Visit (HOSPITAL_COMMUNITY)
Admission: RE | Admit: 2021-04-01 | Discharge: 2021-04-01 | Disposition: A | Discharge status: Home / Self Care | Payer: Medicare Other | Source: Ambulatory Visit | Attending: Family | Admitting: Family

## 2021-04-01 ENCOUNTER — Other Ambulatory Visit: Payer: Self-pay

## 2021-04-01 DIAGNOSIS — I200 Unstable angina: Secondary | ICD-10-CM | POA: Diagnosis not present

## 2021-04-01 DIAGNOSIS — I700 Atherosclerosis of aorta: Secondary | ICD-10-CM | POA: Diagnosis not present

## 2021-04-01 MED ORDER — NITROGLYCERIN 0.4 MG SL SUBL: 0.8000 mg | SUBLINGUAL_TABLET | Freq: Once | SUBLINGUAL | Status: DC

## 2021-04-01 MED ORDER — NITROGLYCERIN 0.4 MG SL SUBL
SUBLINGUAL_TABLET | SUBLINGUAL | Status: AC
  Filled 2021-04-01: qty 2

## 2021-04-01 MED ORDER — IOHEXOL 350 MG/ML SOLN
100.0000 mL | Freq: Once | INTRAVENOUS | Status: AC | PRN
  Administered 2021-04-01: 100 mL via INTRAVENOUS

## 2021-04-02 ENCOUNTER — Encounter (HOSPITAL_BASED_OUTPATIENT_CLINIC_OR_DEPARTMENT_OTHER): Payer: Self-pay

## 2021-04-08 ENCOUNTER — Ambulatory Visit: Payer: Medicare Other | Attending: Internal Medicine

## 2021-04-08 ENCOUNTER — Other Ambulatory Visit: Payer: Self-pay

## 2021-04-08 DIAGNOSIS — R278 Other lack of coordination: Secondary | ICD-10-CM | POA: Insufficient documentation

## 2021-04-08 DIAGNOSIS — R2681 Unsteadiness on feet: Secondary | ICD-10-CM | POA: Insufficient documentation

## 2021-04-08 DIAGNOSIS — G8111 Spastic hemiplegia affecting right dominant side: Secondary | ICD-10-CM | POA: Diagnosis not present

## 2021-04-08 DIAGNOSIS — R262 Difficulty in walking, not elsewhere classified: Secondary | ICD-10-CM | POA: Diagnosis not present

## 2021-04-08 DIAGNOSIS — R2689 Other abnormalities of gait and mobility: Secondary | ICD-10-CM | POA: Insufficient documentation

## 2021-04-08 DIAGNOSIS — M6281 Muscle weakness (generalized): Secondary | ICD-10-CM | POA: Diagnosis not present

## 2021-04-08 DIAGNOSIS — R208 Other disturbances of skin sensation: Secondary | ICD-10-CM | POA: Insufficient documentation

## 2021-04-08 NOTE — Therapy (Signed)
[facility] [address] [place], [place], [address] Phone: [phone]   Fax:  [phone]  Physical Therapy Evaluation  Patient Details  Name: BERNEICE DIEFENBACH MRN: [id number] Date of Birth: Sep 03, 1956 Referring Provider (PT): Frann Rider, NP   Encounter Date: 04/08/2021   PT End of Session - 04/08/21 1002     Visit Number 1    Number of Visits 13    Date for PT Re-Evaluation 05/21/21    Authorization Type Medicare A + B; [employer]    Progress Note Due on Visit 10    PT Start Time 1003    PT Stop Time 1050    PT Time Calculation (min) 47 min    Equipment Utilized During Treatment Gait belt    Activity Tolerance Patient tolerated treatment well    Behavior During Therapy WFL for tasks assessed/performed             Past Medical History:  Diagnosis Date   Abnormal gait    Amnesia    Anemia    hx   Aneurysm ([place])    Anxiety    Asthma    Atrophic vaginitis    Benign hypertensive heart disease    Cardiomegaly    Chest pain    COPD (chronic obstructive pulmonary disease) ([place])     - PFTs  01/24/06 FEV1 69% ratio 68% diffusing capacity 57% with no improvement after B2   - PFT's 10/1/ 09 FEV1 77   ratio  76    dlco                   45            no resp to B2    - Nl alpha one antitripsin level 10/09   Cough    Depression    Difficulty speaking    Disorder of nasal cavity    Dyspnea    Enthesopathy of elbow region    GERD (gastroesophageal reflux disease)    Hammer toe    Headache(784.0)    Hemiparesis affecting dominant side as late effect of cerebrovascular accident ([place])    History of kidney stones    Hyperlipidemia    Hypertension    Hypothyroidism    Increased urinary frequency    Low compliance bladder    Macular degeneration of both eyes    Malaise and fatigue    Nonruptured cerebral aneurysm    Obesity    Pneumonia    hx   Pure hypercholesterolemia    Recurrent major depressive  episodes ([place])    Seizure disorder, secondary ([place])    2-3 focal siezures monthly   Seizures ([place])    Short-term memory loss    since stroke   Skin sensation disturbance    Stroke ([place]) 2011   rt sided weakness   Swelling of limb    TMJ (temporomandibular joint disorder)    Urinary tract infectious disease    Vertigo as late effect of stroke    Vitamin D deficiency     Past Surgical History:  Procedure Laterality Date   BACK SURGERY     CEREBRAL ANGIOGRAM     x3   COLONOSCOPY WITH PROPOFOL N/A 08/18/2016   Procedure: COLONOSCOPY WITH PROPOFOL;  Surgeon: Carol Ada, MD;  Location: [facility];  Service: Endoscopy;  Laterality: N/A;   LAPAROSCOPIC HYSTERECTOMY     MANDIBLE FRACTURE SURGERY     RADIOLOGY WITH ANESTHESIA N/A 08/21/2013   Procedure:  RADIOLOGY WITH ANESTHESIA;  Surgeon: Rob Hickman, MD;  Location: [facility];  Service: Radiology;  Laterality: N/A;   stones     no surgery    There were no vitals filed for this visit.    Subjective Assessment - 04/08/21 1009     Subjective Patient is currently ambulating with a SPC with quad tip. Patient reports that she is having frequent falls. Reports that her R foot turns and and that her toes curl. Patient reports that she has bruises on the toes. Patient reports she has an AFO but does not use the brace. Will try to bring it in the future. Patient/Husband reports that the balance is off, feel as she is constantly trying to hold onto something.    Patient is accompained by: Family member   Husband   Pertinent History left frontoparietal parenchymal hemorrhage (2011), mild cognitive impairment, seizures, chronic headaches, cerebral aneurysm s/p stenting 2014, essential tremors, COPD and HTN, macular degeneration, Hypothyroidism, HLD, GERD, Hammer Toe, Depression, Cardiomegaly, Asthma, Anxiety and Anemia    Limitations Standing;Walking    Patient Stated Goals Improve Balance; Try to get toes in good position    Currently in  Pain? Yes    Pain Score 6     Pain Location Hand    Pain Orientation Right    Pain Descriptors / Indicators Aching    Pain Type Chronic pain    Pain Onset More than a month ago    Pain Frequency Constant                [facility] PT Assessment - 04/08/21 0001       Assessment   Medical Diagnosis Gait Abnormalities Post Stroke    Referring Provider (PT) Frann Rider, NP    Onset Date/Surgical Date 03/29/21   referral date   Hand Dominance Right    Prior Therapy known to this clinic for prior PT      Precautions   Precautions Fall    Required Braces or Orthoses Other Brace/Splint   has AFO , but has not utilized in a long time     Balance Screen   Has the patient fallen in the past 6 months Yes    How many times? 2    Has the patient had a decrease in activity level because of a fear of falling?  Yes    Is the patient reluctant to leave their home because of a fear of falling?  Yes      [place] Private residence    Living Arrangements Spouse/significant other    Available Help at Discharge Family    Type of [place] to enter    Entrance Stairs-Number of Steps 5    Entrance Stairs-Rails Right    [place] Two level;Able to live on main level with bedroom/bathroom    [place] - 2 wheels;Cane - single point;Shower seat   R AFO; Tens; Husband Leisure centre manager W/C     Prior Function   Level of Independence Independent with household mobility with device;Independent with community mobility with device    Vocation On disability      Cognition   Overall Cognitive Status Impaired/Different from baseline   reports short term memory was impaired after CVA     Observation/Other Assessments   Focus on Therapeutic Outcomes (FOTO)  Stroke LE: 48%      Sensation   Light Touch Impaired by gross  assessment    Additional Comments pins/needling throughout RUE/RLE; impaired sesnsation on RLE      Coordination    Gross Motor Movements are Fluid and Coordinated No    Coordination and Movement Description general uncoordinated on RLE      Posture/Postural Control   Posture/Postural Control Postural limitations    Postural Limitations Rounded Shoulders;Forward head;Weight shift left      Tone   Assessment Location Right Lower Extremity      ROM / Strength   AROM / PROM / Strength Strength      Strength   Overall Strength Deficits    Strength Assessment Site Hip;Knee;Ankle    Right/Left Hip Right;Left    Right Hip Flexion 3+/5    Right Hip ABduction 3/5    Right Hip ADduction 3/5    Left Hip Flexion 4/5    Left Hip ABduction 4/5    Left Hip ADduction 4/5    Right/Left Knee Right;Left    Right Knee Flexion 3+/5    Right Knee Extension 3+/5    Left Knee Flexion 4+/5    Left Knee Extension 4+/5    Right/Left Ankle Right;Left    Right Ankle Dorsiflexion 2+/5    Left Ankle Dorsiflexion 4-/5      Palpation   Palpation comment Removed shoe and assessed R ankle/foot postion; patient demo curling of toes on R Foot as well as inverted foot with seated position      Transfers   Transfers Sit to Stand;Stand to Sit    Sit to Stand 4: Min guard    Five time sit to stand comments  22.00 secs with BUE support    Stand to Sit 4: Min guard      Ambulation/Gait   Ambulation/Gait Yes    Ambulation/Gait Assistance 4: Min guard    Ambulation/Gait Assistance Details ambulating with SPC with quad tip. No brace donned., Patient demo mild recurvatum on R    Ambulation Distance (Feet) 50 Feet    Assistive device Straight cane   with quad tip attachment   Gait Pattern Step-through pattern;Decreased arm swing - right;Decreased step length - left;Decreased step length - right;Decreased stance time - right;Decreased hip/knee flexion - right;Decreased dorsiflexion - right;Decreased weight shift to right;Right circumduction;Poor foot clearance - right    Ambulation Surface Level;Indoor    Gait velocity 23.34 secs  = 1.41 ft/sec      Standardized Balance Assessment   Standardized Balance Assessment Berg Balance Test      Berg Balance Test   Sit to Stand Able to stand  independently using hands    Standing Unsupported Able to stand 2 minutes with supervision    Sitting with Back Unsupported but Feet Supported on Floor or Stool Able to sit safely and securely 2 minutes    Stand to Sit Controls descent by using hands    Transfers Able to transfer safely, definite need of hands    Standing Unsupported with Eyes Closed Able to stand 10 seconds with supervision    Standing Unsupported with Feet Together Needs help to attain position but able to stand for 30 seconds with feet together    From Standing, Reach Forward with Outstretched Arm Can reach forward >5 cm safely (2")    From Standing Position, Pick up Object from Floor Able to pick up shoe, needs supervision    From Standing Position, Turn to Look Behind Over each Shoulder Needs supervision when turning    Turn 360 Degrees Needs close supervision  or verbal cueing    Standing Unsupported, Alternately Place Feet on Step/Stool Able to complete >2 steps/needs minimal assist    Standing Unsupported, One Foot in Front Needs help to step but can hold 15 seconds    Standing on One Leg Tries to lift leg/unable to hold 3 seconds but remains standing independently    Total Score 30    Berg comment: 30/56      RLE Tone   RLE Tone Mild;Modified Ashworth      RLE Tone   Modified Ashworth Scale for Grading Hypertonia RLE Slight increase in muscle tone, manifested by a catch, followed by minimal resistance throughout the remainder (less than half) of the ROM                        Objective measurements completed on examination: See above findings.               PT Education - 04/08/21 1003     Education Details Educated on [employer]; Bring Prior Bracing Options to next session    Person(s) Educated Patient;Spouse     Methods Explanation    Comprehension Verbalized understanding              PT Short Term Goals - 04/08/21 1135       PT SHORT TERM GOAL #1   Title Patient will be IND with initial HEP for strength/balance (ALL STGS Due: 04/30/21)    Baseline no HEP established    Time 3    Period Weeks    Status New    Target Date 04/30/21      PT SHORT TERM GOAL #2   Title Patient will report understanding of fall prevention within the home to promote improved safety    Baseline dependent    Time 3    Period Weeks    Status New      PT SHORT TERM GOAL #3   Title Patient will improve 5x sit <> stand to </= 20 seconds to demo improved balance and strength    Baseline 22 seconds with UE support    Time 3    Period Weeks    Status New      PT SHORT TERM GOAL #4   Title Patient will improve Berg Balance to >/= 35/56 to demo improved balance and reduced fall risk    Baseline 30/56    Time 3    Period Weeks    Status New               PT Long Term Goals - 04/08/21 1137       PT LONG TERM GOAL #1   Title Patient will be independent with final HEP for balance/strengthening (ALL LTGs Due: 05/21/21)    Baseline No HEP established    Time 6    Period Weeks    Status New    Target Date 05/21/21      PT LONG TERM GOAL #2   Title Patient will improve Berg Balance to >/= 40/56 to demo improved balance and reduced fall risk    Baseline 30/56    Time 6    Period Weeks    Status New      PT LONG TERM GOAL #3   Title Patient will improve 5x sit <> stand to </= 15 seconds to demo reduced fall risk    Baseline 22 secs    Time 6    Period Weeks  Status New      PT LONG TERM GOAL #4   Title Patient will improve gait speed to >/= 2.0 ft/sec with LRAD and potential AFO for improved community mobility    Baseline 1.41 ft/sec    Time 6    Period Weeks    Status New      PT LONG TERM GOAL #5   Title Patient will improve FOTO >/= 55%    Baseline 48%    Time 6    Period Weeks     Status New                    Plan - 04/08/21 1002     Clinical Impression Statement Patient is a 65 y.o. female referred to Neuro OPPT for gait abnormality due to Chronic CVA. Patient's PMH significant for the following: left frontoparietal parenchymal hemorrhage (2011), mild cognitive impairment, seizures, chronic headaches, cerebral aneurysm s/p stenting 2014, essential tremors, COPD and HTN, macular degeneration, Hypothyroidism, HLD, GERD, Hammer Toe, Depression, Cardiomegaly, Asthma, Anxiety and Anemia. Patient presents with the following impairments upon evaluation: decreased strength, abnormal tone, decreased coordination, impaired balance, abnormamal gait, decreased endurance/activity tolerance, impaired sensation, and increased risk for falls. Patient is curently ambulating at 1.41 ft/sec with SPC with quad time. Patient is at high fall risk with Berg score of 30/56, and 5x sit <> stand of 22 seconds. Patient will benefit from skilled PT services address impairments and reduce fall risk.    Personal Factors and Comorbidities Comorbidity 3+    Comorbidities left frontoparietal parenchymal hemorrhage (2011), mild cognitive impairment, seizures, chronic headaches, cerebral aneurysm s/p stenting 2014, essential tremors, COPD and HTN, macular degeneration, Hypothyroidism, HLD, GERD, Hammer Toe, Depression, Cardiomegaly, Asthma, Anxiety and Anemia    Examination-Activity Limitations Transfers;Locomotion Level;Bend;Dressing;Stand;Stairs;Reach Overhead    Examination-Participation Restrictions Cleaning;Meal Prep;Community Activity    Stability/Clinical Decision Making Stable/Uncomplicated    Clinical Decision Making Low    Rehab Potential Good    PT Frequency 2x / week    PT Duration 6 weeks    PT Treatment/Interventions ADLs/Self Care Home Management;Aquatic Therapy;Electrical Stimulation;Moist Heat;Cryotherapy;DME Instruction;Gait training;Stair training;Functional mobility  training;Therapeutic activities;Therapeutic exercise;Balance training;Neuromuscular re-education;Patient/family education;Orthotic Fit/Training;Manual techniques;Taping;Vestibular;Passive range of motion;Dry needling    PT Next Visit Plan Assess Gait Speed and AFO's that patient bring to session. Trial Gait with vairous braces to determine best fit. INitiate HEP on strength/balance    Recommended Other Services Occupational Therapy    Consulted and Agree with Plan of Care Patient             Patient will benefit from skilled therapeutic intervention in order to improve the following deficits and impairments:  Abnormal gait, Decreased coordination, Decreased range of motion, Difficulty walking, Pain, Postural dysfunction, Decreased strength, Decreased balance, Decreased knowledge of use of DME, Impaired tone, Decreased safety awareness, Decreased endurance, Decreased activity tolerance  Visit Diagnosis: Difficulty in walking, not elsewhere classified  Muscle weakness (generalized)  Unsteadiness on feet  Other abnormalities of gait and mobility     Problem List Patient Active Problem List   Diagnosis Date Noted   Family history of heart disease 06/14/2020   History of CVA (cerebrovascular accident) 06/14/2020   Exudative age-related macular degeneration of right eye with active choroidal neovascularization ([place]) 09/11/2019   Nocturnal hypoxemia 09/11/2019   Non-restorative sleep 06/13/2019   Benign myoclonus of drowsiness 06/13/2019   Excessive daytime sleepiness 06/13/2019   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   Hemiparesis affecting right side as late  effect of cerebrovascular accident ([place]) 06/13/2019   Macular degeneration of right eye 06/11/2019   Class 1 obesity due to excess calories with serious comorbidity and body mass index (BMI) of 31.0 to 31.9 in adult 06/11/2019   Moderate episode of recurrent major depressive disorder ([place]) 06/11/2019   Spastic  hemiparesis ([place]) 03/25/2019   Prediabetes 01/01/2019   Acquired hypothyroidism 01/01/2019   Hypertension 01/01/2019   Aortic atherosclerosis ([place]) 11/07/2018   History of embolic stroke without residual deficits 03/29/2018   Seizure disorder ([place]) 01/12/2017   Common migraine 01/12/2017   Aneurysm ([place])    Brain aneurysm 08/21/2013   Right foot drop 04/10/2013   Intracerebral hemorrhage ([place]) 03/19/2013   Convulsions ([place]) 03/19/2013   Headache 03/19/2013   Abnormality of gait 03/19/2013   Edema 02/23/2011   COUGH 06/10/2008   HYPERLIPIDEMIA 04/25/2008   TOBACCO ABUSE 04/25/2008   ASTHMA 04/25/2008   Chronic obstructive pulmonary disease ([place]) 04/25/2008    Jones Bales, PT, DPT 04/08/2021, 11:40 AM  [facility] [address] [place], [place], [address] Phone: [phone]   Fax:  [phone]  Name: DANNELLE BARGERSTOCK MRN: [id number] Date of Birth: 08-12-1956

## 2021-04-09 ENCOUNTER — Telehealth: Payer: Self-pay

## 2021-04-09 DIAGNOSIS — G8111 Spastic hemiplegia affecting right dominant side: Secondary | ICD-10-CM

## 2021-04-09 NOTE — Telephone Encounter (Signed)
Frann Rider, NP, [name] was evaluated by Physical Therapy on 04/08/2021.  The patient would benefit from Occupational Therapy evaluation for difficulty with fine motor skills, dressing.   If you agree, please place an order in [facility] workque in [facility] or fax the order to [phone]. Thank you, Jones Bales, PT, [facility] [address] [place], [place]  [address] Phone:  [phone] Fax:  [phone]

## 2021-04-10 ENCOUNTER — Other Ambulatory Visit: Payer: Self-pay

## 2021-04-10 ENCOUNTER — Ambulatory Visit
Admission: RE | Admit: 2021-04-10 | Discharge: 2021-04-10 | Disposition: A | Discharge status: Home / Self Care | Payer: Medicare Other | Source: Ambulatory Visit | Attending: Adult Health | Admitting: Adult Health

## 2021-04-10 DIAGNOSIS — M542 Cervicalgia: Secondary | ICD-10-CM | POA: Diagnosis not present

## 2021-04-10 DIAGNOSIS — R269 Unspecified abnormalities of gait and mobility: Secondary | ICD-10-CM | POA: Diagnosis not present

## 2021-04-12 ENCOUNTER — Other Ambulatory Visit: Payer: Self-pay | Admitting: Adult Health

## 2021-04-12 DIAGNOSIS — M4802 Spinal stenosis, cervical region: Secondary | ICD-10-CM

## 2021-04-13 ENCOUNTER — Telehealth: Payer: Self-pay | Admitting: Adult Health

## 2021-04-13 ENCOUNTER — Ambulatory Visit: Payer: Medicare Other

## 2021-04-13 ENCOUNTER — Other Ambulatory Visit: Payer: Self-pay

## 2021-04-13 DIAGNOSIS — R262 Difficulty in walking, not elsewhere classified: Secondary | ICD-10-CM | POA: Diagnosis not present

## 2021-04-13 DIAGNOSIS — R2681 Unsteadiness on feet: Secondary | ICD-10-CM

## 2021-04-13 DIAGNOSIS — G8111 Spastic hemiplegia affecting right dominant side: Secondary | ICD-10-CM | POA: Diagnosis not present

## 2021-04-13 DIAGNOSIS — M6281 Muscle weakness (generalized): Secondary | ICD-10-CM

## 2021-04-13 DIAGNOSIS — R278 Other lack of coordination: Secondary | ICD-10-CM | POA: Diagnosis not present

## 2021-04-13 DIAGNOSIS — R2689 Other abnormalities of gait and mobility: Secondary | ICD-10-CM

## 2021-04-13 NOTE — Therapy (Signed)
[facility] [address] [place], [place], [address] Phone: [phone]   Fax:  [phone]  Physical Therapy Treatment  Patient Details  Name: IREENE [name] MRN: [id number] Date of Birth: September 03, 1956 Referring Provider (PT): Frann Rider, NP   Encounter Date: 04/13/2021   PT End of Session - 04/13/21 1317     Visit Number 2    Number of Visits 13    Date for PT Re-Evaluation 05/21/21    Authorization Type Medicare A + B; [employer]    Progress Note Due on Visit 10    PT Start Time 1315    PT Stop Time 1402    PT Time Calculation (min) 47 min    Equipment Utilized During Treatment Gait belt    Activity Tolerance Patient tolerated treatment well    Behavior During Therapy WFL for tasks assessed/performed             Past Medical History:  Diagnosis Date   Abnormal gait    Amnesia    Anemia    hx   Aneurysm ([place])    Anxiety    Asthma    Atrophic vaginitis    Benign hypertensive heart disease    Cardiomegaly    Chest pain    COPD (chronic obstructive pulmonary disease) ([place])     - PFTs  01/24/06 FEV1 69% ratio 68% diffusing capacity 57% with no improvement after B2   - PFT's 10/1/ 09 FEV1 77   ratio  76    dlco                   45            no resp to B2    - Nl alpha one antitripsin level 10/09   Cough    Depression    Difficulty speaking    Disorder of nasal cavity    Dyspnea    Enthesopathy of elbow region    GERD (gastroesophageal reflux disease)    Hammer toe    Headache(784.0)    Hemiparesis affecting dominant side as late effect of cerebrovascular accident ([place])    History of kidney stones    Hyperlipidemia    Hypertension    Hypothyroidism    Increased urinary frequency    Low compliance bladder    Macular degeneration of both eyes    Malaise and fatigue    Nonruptured cerebral aneurysm    Obesity    Pneumonia    hx   Pure hypercholesterolemia    Recurrent major depressive  episodes ([place])    Seizure disorder, secondary ([place])    2-3 focal siezures monthly   Seizures ([place])    Short-term memory loss    since stroke   Skin sensation disturbance    Stroke ([place]) 2011   rt sided weakness   Swelling of limb    TMJ (temporomandibular joint disorder)    Urinary tract infectious disease    Vertigo as late effect of stroke    Vitamin D deficiency     Past Surgical History:  Procedure Laterality Date   BACK SURGERY     CEREBRAL ANGIOGRAM     x3   COLONOSCOPY WITH PROPOFOL N/A 08/18/2016   Procedure: COLONOSCOPY WITH PROPOFOL;  Surgeon: Carol Ada, MD;  Location: [facility];  Service: Endoscopy;  Laterality: N/A;   LAPAROSCOPIC HYSTERECTOMY     MANDIBLE FRACTURE SURGERY     RADIOLOGY WITH ANESTHESIA N/A 08/21/2013   Procedure:  RADIOLOGY WITH ANESTHESIA;  Surgeon: Rob Hickman, MD;  Location: [facility];  Service: Radiology;  Laterality: N/A;   stones     no surgery    There were no vitals filed for this visit.   Subjective Assessment - 04/13/21 1317     Subjective Patient denies any new issues. Just had MRI on neck. Pt brought her AFOs that she has not been wearing.    Patient is accompained by: Family member   Husband   Pertinent History left frontoparietal parenchymal hemorrhage (2011), mild cognitive impairment, seizures, chronic headaches, cerebral aneurysm s/p stenting 2014, essential tremors, COPD and HTN, macular degeneration, Hypothyroidism, HLD, GERD, Hammer Toe, Depression, Cardiomegaly, Asthma, Anxiety and Anemia    Limitations Standing;Walking    Patient Stated Goals Improve Balance; Try to get toes in good position    Currently in Pain? Yes    Pain Score 5     Pain Location Abdomen    Pain Orientation Right    Pain Descriptors / Indicators Aching    Pain Onset More than a month ago    Pain Frequency Constant    Aggravating Factors  fall a few months ago    Multiple Pain Sites Yes    Pain Score 7    Pain Location Neck    Pain  Orientation Posterior;Right    Pain Descriptors / Indicators Sore    Pain Type Chronic pain    Pain Onset More than a month ago    Pain Frequency Constant    Pain Relieving Factors moving neck around with exercises.                               [facility] Adult PT Treatment/Exercise - 04/13/21 1359       Ambulation/Gait   Ambulation/Gait Yes    Ambulation/Gait Assistance 4: Min guard    Ambulation/Gait Assistance Details Pt ambulated with various AFOs on left ankle to see what may be most helpful for her. Pt had brought in her 3 prior braces: 1st was original custom plastic molded AFO with hinge ankle and PF stop. Has not worn in years and too restrictive for what pt needs. Her 2nd AFO was an anterior AFO with lateral strut but with pt having some recurvatum tendencies this was not best option to try. 3rd one was a richie ankle brace with posterior foot support. Trialed the richie but had to donn clinic shoe as would not fit in her sneakers. This provided good ankle stability was did not allow any DF at toe off. Pt also tended to invert forefoot more. PT trialed posterior ottobock AFO with lateral t strap. Pt had good foot clearance and reported feeling secure at ankle with the strap. Also trialed without the strap but pt did not feel as secure. PT trialed just foot up brace which had good foot clearance but no support for ankle and knee appeared less stable.    Ambulation Distance (Feet) 60 Feet   x 4 for each brace trial   Assistive device Straight cane   with quad tip   Gait Pattern Step-through pattern;Decreased stance time - right;Decreased dorsiflexion - right;Decreased weight shift to right    Ambulation Surface Level;Indoor                    PT Education - 04/13/21 1421     Education Details Discussed pros and cons of braces trialed during session. Pt  to bring what sounds like more of an ASO next visit that she has worn most recently as well as her Bioness  unit.    Person(s) Educated Patient;Spouse    Methods Explanation;Demonstration    Comprehension Verbalized understanding              PT Short Term Goals - 04/08/21 1135       PT SHORT TERM GOAL #1   Title Patient will be IND with initial HEP for strength/balance (ALL STGS Due: 04/30/21)    Baseline no HEP established    Time 3    Period Weeks    Status New    Target Date 04/30/21      PT SHORT TERM GOAL #2   Title Patient will report understanding of fall prevention within the home to promote improved safety    Baseline dependent    Time 3    Period Weeks    Status New      PT SHORT TERM GOAL #3   Title Patient will improve 5x sit <> stand to </= 20 seconds to demo improved balance and strength    Baseline 22 seconds with UE support    Time 3    Period Weeks    Status New      PT SHORT TERM GOAL #4   Title Patient will improve Berg Balance to >/= 35/56 to demo improved balance and reduced fall risk    Baseline 30/56    Time 3    Period Weeks    Status New               PT Long Term Goals - 04/08/21 1137       PT LONG TERM GOAL #1   Title Patient will be independent with final HEP for balance/strengthening (ALL LTGs Due: 05/21/21)    Baseline No HEP established    Time 6    Period Weeks    Status New    Target Date 05/21/21      PT LONG TERM GOAL #2   Title Patient will improve Berg Balance to >/= 40/56 to demo improved balance and reduced fall risk    Baseline 30/56    Time 6    Period Weeks    Status New      PT LONG TERM GOAL #3   Title Patient will improve 5x sit <> stand to </= 15 seconds to demo reduced fall risk    Baseline 22 secs    Time 6    Period Weeks    Status New      PT LONG TERM GOAL #4   Title Patient will improve gait speed to >/= 2.0 ft/sec with LRAD and potential AFO for improved community mobility    Baseline 1.41 ft/sec    Time 6    Period Weeks    Status New      PT LONG TERM GOAL #5   Title Patient will improve  FOTO >/= 55%    Baseline 48%    Time 6    Period Weeks    Status New                   Plan - 04/13/21 1422     Clinical Impression Statement PT trialed differenct AFOs/braces during session today. Will need to continue to try but pt did best with posterior ottobock AFO with lateral t strap. Pt was not noted to have significant ankle inversion with gait but does  have decreased stance time on right and loses balance to left at times.    Personal Factors and Comorbidities Comorbidity 3+    Comorbidities left frontoparietal parenchymal hemorrhage (2011), mild cognitive impairment, seizures, chronic headaches, cerebral aneurysm s/p stenting 2014, essential tremors, COPD and HTN, macular degeneration, Hypothyroidism, HLD, GERD, Hammer Toe, Depression, Cardiomegaly, Asthma, Anxiety and Anemia    Examination-Activity Limitations Transfers;Locomotion Level;Bend;Dressing;Stand;Stairs;Reach Overhead    Examination-Participation Restrictions Cleaning;Meal Prep;Community Activity    Stability/Clinical Decision Making Stable/Uncomplicated    Rehab Potential Good    PT Frequency 2x / week    PT Duration 6 weeks    PT Treatment/Interventions ADLs/Self Care Home Management;Aquatic Therapy;Electrical Stimulation;Moist Heat;Cryotherapy;DME Instruction;Gait training;Stair training;Functional mobility training;Therapeutic activities;Therapeutic exercise;Balance training;Neuromuscular re-education;Patient/family education;Orthotic Fit/Training;Manual techniques;Taping;Vestibular;Passive range of motion;Dry needling    PT Next Visit Plan Continue to trial AFOs for best fit but posterior ottobock with lateral t strap worked best today. Pt going to bring what sounds like an ASO and her Bioness next time. INitiate HEP on strength/balance. Did OT get scheduled?    Consulted and Agree with Plan of Care Patient             Patient will benefit from skilled therapeutic intervention in order to improve the  following deficits and impairments:  Abnormal gait, Decreased coordination, Decreased range of motion, Difficulty walking, Pain, Postural dysfunction, Decreased strength, Decreased balance, Decreased knowledge of use of DME, Impaired tone, Decreased safety awareness, Decreased endurance, Decreased activity tolerance  Visit Diagnosis: Other abnormalities of gait and mobility  Muscle weakness (generalized)  Unsteadiness on feet     Problem List Patient Active Problem List   Diagnosis Date Noted   Family history of heart disease 06/14/2020   History of CVA (cerebrovascular accident) 06/14/2020   Exudative age-related macular degeneration of right eye with active choroidal neovascularization ([place]) 09/11/2019   Nocturnal hypoxemia 09/11/2019   Non-restorative sleep 06/13/2019   Benign myoclonus of drowsiness 06/13/2019   Excessive daytime sleepiness 06/13/2019   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   Hemiparesis affecting right side as late effect of cerebrovascular accident ([place]) 06/13/2019   Macular degeneration of right eye 06/11/2019   Class 1 obesity due to excess calories with serious comorbidity and body mass index (BMI) of 31.0 to 31.9 in adult 06/11/2019   Moderate episode of recurrent major depressive disorder ([place]) 06/11/2019   Spastic hemiparesis ([place]) 03/25/2019   Prediabetes 01/01/2019   Acquired hypothyroidism 01/01/2019   Hypertension 01/01/2019   Aortic atherosclerosis ([place]) 11/07/2018   History of embolic stroke without residual deficits 03/29/2018   Seizure disorder ([place]) 01/12/2017   Common migraine 01/12/2017   Aneurysm ([place])    Brain aneurysm 08/21/2013   Right foot drop 04/10/2013   Intracerebral hemorrhage ([place]) 03/19/2013   Convulsions ([place]) 03/19/2013   Headache 03/19/2013   Abnormality of gait 03/19/2013   Edema 02/23/2011   COUGH 06/10/2008   HYPERLIPIDEMIA 04/25/2008   TOBACCO ABUSE 04/25/2008   ASTHMA 04/25/2008   Chronic  obstructive pulmonary disease ([place]) 04/25/2008    Electa Sniff, PT, DPT, NCS 04/13/2021, 2:26 PM  [facility] [address] [place], [place], [address] Phone: [phone]   Fax:  [phone]  Name: MELISIA SIELAFF MRN: [id number] Date of Birth: [birth year]

## 2021-04-13 NOTE — Telephone Encounter (Signed)
Referral to Dr. Vertell Limber faxed to [facility]. Phone: [phone]. Fax: [phone].

## 2021-04-16 ENCOUNTER — Other Ambulatory Visit: Payer: Self-pay

## 2021-04-16 ENCOUNTER — Ambulatory Visit: Payer: Medicare Other

## 2021-04-16 DIAGNOSIS — M6281 Muscle weakness (generalized): Secondary | ICD-10-CM

## 2021-04-16 DIAGNOSIS — G8111 Spastic hemiplegia affecting right dominant side: Secondary | ICD-10-CM | POA: Diagnosis not present

## 2021-04-16 DIAGNOSIS — R2689 Other abnormalities of gait and mobility: Secondary | ICD-10-CM

## 2021-04-16 DIAGNOSIS — R262 Difficulty in walking, not elsewhere classified: Secondary | ICD-10-CM | POA: Diagnosis not present

## 2021-04-16 DIAGNOSIS — R2681 Unsteadiness on feet: Secondary | ICD-10-CM | POA: Diagnosis not present

## 2021-04-16 DIAGNOSIS — R278 Other lack of coordination: Secondary | ICD-10-CM | POA: Diagnosis not present

## 2021-04-16 NOTE — Patient Instructions (Signed)
Access Code: [email or web address] URL: [email or web address] Date: 04/16/2021 Prepared by: Cherly Anderson  Exercises Supine Bridge - 1 x daily - 5 x weekly - 2 sets - 10 reps Clamshell - 1 x daily - 5 x weekly - 2 sets - 10 reps Prone Knee Flexion - 1 x daily - 5 x weekly - 2 sets - 10 reps Standing Knee Flexion AROM with Chair Support - 1 x daily - 5 x weekly - 2 sets - 10 reps Forward Step Up - 1 x daily - 5 x weekly - 2 sets - 10 reps

## 2021-04-16 NOTE — Therapy (Signed)
[facility] [address] [place], [place], [address] Phone: [phone]   Fax:  [phone]  Physical Therapy Treatment  Patient Details  Name: [name] MRN: [id number] Date of Birth: 07-20-1956 Referring Provider (PT): Frann Rider, NP   Encounter Date: 04/16/2021   PT End of Session - 04/16/21 1444     Visit Number 3    Number of Visits 13    Date for PT Re-Evaluation 05/21/21    Authorization Type Medicare A + B; [employer]    Progress Note Due on Visit 10    PT Start Time 1444    PT Stop Time 1530    PT Time Calculation (min) 46 min    Equipment Utilized During Treatment Gait belt    Activity Tolerance Patient tolerated treatment well    Behavior During Therapy WFL for tasks assessed/performed             Past Medical History:  Diagnosis Date   Abnormal gait    Amnesia    Anemia    hx   Aneurysm ([place])    Anxiety    Asthma    Atrophic vaginitis    Benign hypertensive heart disease    Cardiomegaly    Chest pain    COPD (chronic obstructive pulmonary disease) ([place])     - PFTs  01/24/06 FEV1 69% ratio 68% diffusing capacity 57% with no improvement after B2   - PFT's 10/1/ 09 FEV1 77   ratio  76    dlco                   45            no resp to B2    - Nl alpha one antitripsin level 10/09   Cough    Depression    Difficulty speaking    Disorder of nasal cavity    Dyspnea    Enthesopathy of elbow region    GERD (gastroesophageal reflux disease)    Hammer toe    Headache(784.0)    Hemiparesis affecting dominant side as late effect of cerebrovascular accident ([place])    History of kidney stones    Hyperlipidemia    Hypertension    Hypothyroidism    Increased urinary frequency    Low compliance bladder    Macular degeneration of both eyes    Malaise and fatigue    Nonruptured cerebral aneurysm    Obesity    Pneumonia    hx   Pure hypercholesterolemia    Recurrent major depressive  episodes ([place])    Seizure disorder, secondary ([place])    2-3 focal siezures monthly   Seizures ([place])    Short-term memory loss    since stroke   Skin sensation disturbance    Stroke ([place]) 2011   rt sided weakness   Swelling of limb    TMJ (temporomandibular joint disorder)    Urinary tract infectious disease    Vertigo as late effect of stroke    Vitamin D deficiency     Past Surgical History:  Procedure Laterality Date   BACK SURGERY     CEREBRAL ANGIOGRAM     x3   COLONOSCOPY WITH PROPOFOL N/A 08/18/2016   Procedure: COLONOSCOPY WITH PROPOFOL;  Surgeon: Carol Ada, MD;  Location: [facility];  Service: Endoscopy;  Laterality: N/A;   LAPAROSCOPIC HYSTERECTOMY     MANDIBLE FRACTURE SURGERY     RADIOLOGY WITH ANESTHESIA N/A 08/21/2013   Procedure:  RADIOLOGY WITH ANESTHESIA;  Surgeon: Rob Hickman, MD;  Location: [facility];  Service: Radiology;  Laterality: N/A;   stones     no surgery    There were no vitals filed for this visit.   Subjective Assessment - 04/16/21 1445     Subjective Pt brought her Bioness and ASO today to trial    Patient is accompained by: Family member   Husband   Pertinent History left frontoparietal parenchymal hemorrhage (2011), mild cognitive impairment, seizures, chronic headaches, cerebral aneurysm s/p stenting 2014, essential tremors, COPD and HTN, macular degeneration, Hypothyroidism, HLD, GERD, Hammer Toe, Depression, Cardiomegaly, Asthma, Anxiety and Anemia    Limitations Standing;Walking    Patient Stated Goals Improve Balance; Try to get toes in good position    Currently in Pain? Yes    Pain Score 5     Pain Location Hand    Pain Orientation Right    Pain Descriptors / Indicators Aching    Pain Type Chronic pain    Pain Onset More than a month ago    Pain Frequency Constant    Pain Score 5    Pain Location Neck    Pain Orientation Posterior;Right    Pain Descriptors / Indicators Sore    Pain Type Chronic pain    Pain Onset More  than a month ago    Pain Frequency Constant                               [facility] Adult PT Treatment/Exercise - 04/16/21 1446       Transfers   Transfers Sit to Stand;Stand to Sit    Sit to Stand 5: Supervision    Stand to Sit 5: Supervision      Ambulation/Gait   Ambulation/Gait Yes    Ambulation/Gait Assistance 5: Supervision;4: Min guard    Ambulation/Gait Assistance Details Pt ambulated with right ASO which did help provide some medial-lateral stability. Pt was cued to tighten right glut during stance to try to increase right stance time. Pt is able to clear foot consistently but does have decreased weight shift right. Knee slightly unstable but no significant recurvatum during gait. Did not trial pt's home Bioness at unit was not charged. Has a heel switch as well. Has not worn in 2 years. May try in the future to see if helpful but pt reports that it made her sweat a lot.    Assistive device Straight cane   with quad tip and right ASO donned   Gait Pattern Step-through pattern;Decreased arm swing - right;Decreased stance time - right;Decreased weight shift to right;Decreased hip/knee flexion - right    Ambulation Surface Level;Indoor    Stairs Yes    Stairs Assistance 5: Supervision    Stairs Assistance Details (indicate cue type and reason) Up/down steps with right rail and cane going up and only rail going down with stepping up with RLE at first and reciprocal down then stepping up with LLE on 2nd bout which was more stable. Pt was instructed to be sure to not lock out right knee when shifting over. When doing at home husband is with her. Advised to use LLE to step up if feeling weaker as is safest way.    Stair Management Technique One rail Right;Alternating pattern;Step to pattern;With cane    Number of Stairs 8    Height of Stairs 6      Neuro Re-ed  Neuro Re-ed Details  In // bars: tapping 4" step with LLE to faciliate right weight shift x 10 with only LUE  support with PT providing tactile cues at knee to not lock out. Step-ups on 4" step with RLE x 10 with bilateral UE support then x 10 with only LUE support with visual cues in mirror as well as verbal and tactile cues to shift weight over right leg and not lock out knee CGA.      Exercises   Exercises Other Exercises    Other Exercises  Standing right hamstring curl x 10 with verbal cues for form. Supine bridges x 10 with verbal cues to keep pelvis level and not let right leg fall out with knees slightly apart. Sidelying right clamshell x 10 with verbal and tactile cues for form through partial range. Prone right knee flexion x 10 with muscle tapping to help facilitate hamstring contraction and cues to control descent.                    PT Education - 04/16/21 1559     Education Details Started on initial HEP. Discussed working on strengthening before making decision on AFO need versus just continuing with ASO.    Person(s) Educated Patient    Methods Explanation;Demonstration;Handout    Comprehension Verbalized understanding;Returned demonstration              PT Short Term Goals - 04/08/21 1135       PT SHORT TERM GOAL #1   Title Patient will be IND with initial HEP for strength/balance (ALL STGS Due: 04/30/21)    Baseline no HEP established    Time 3    Period Weeks    Status New    Target Date 04/30/21      PT SHORT TERM GOAL #2   Title Patient will report understanding of fall prevention within the home to promote improved safety    Baseline dependent    Time 3    Period Weeks    Status New      PT SHORT TERM GOAL #3   Title Patient will improve 5x sit <> stand to </= 20 seconds to demo improved balance and strength    Baseline 22 seconds with UE support    Time 3    Period Weeks    Status New      PT SHORT TERM GOAL #4   Title Patient will improve Berg Balance to >/= 35/56 to demo improved balance and reduced fall risk    Baseline 30/56    Time 3     Period Weeks    Status New               PT Long Term Goals - 04/08/21 1137       PT LONG TERM GOAL #1   Title Patient will be independent with final HEP for balance/strengthening (ALL LTGs Due: 05/21/21)    Baseline No HEP established    Time 6    Period Weeks    Status New    Target Date 05/21/21      PT LONG TERM GOAL #2   Title Patient will improve Berg Balance to >/= 40/56 to demo improved balance and reduced fall risk    Baseline 30/56    Time 6    Period Weeks    Status New      PT LONG TERM GOAL #3   Title Patient will improve 5x sit <> stand  to </= 15 seconds to demo reduced fall risk    Baseline 22 secs    Time 6    Period Weeks    Status New      PT LONG TERM GOAL #4   Title Patient will improve gait speed to >/= 2.0 ft/sec with LRAD and potential AFO for improved community mobility    Baseline 1.41 ft/sec    Time 6    Period Weeks    Status New      PT LONG TERM GOAL #5   Title Patient will improve FOTO >/= 55%    Baseline 48%    Time 6    Period Weeks    Status New                   Plan - 04/16/21 1600     Clinical Impression Statement Pt donned her ASO on right ankle during session. Did well with clearing foot but knee does have some instability at times. Focused on weight shifting to right and strengthening during session. Plan to wait on AFO determination until we work on strengthening RLE more.    Personal Factors and Comorbidities Comorbidity 3+    Comorbidities left frontoparietal parenchymal hemorrhage (2011), mild cognitive impairment, seizures, chronic headaches, cerebral aneurysm s/p stenting 2014, essential tremors, COPD and HTN, macular degeneration, Hypothyroidism, HLD, GERD, Hammer Toe, Depression, Cardiomegaly, Asthma, Anxiety and Anemia    Examination-Activity Limitations Transfers;Locomotion Level;Bend;Dressing;Stand;Stairs;Reach Overhead    Examination-Participation Restrictions Cleaning;Meal Prep;Community Activity     Stability/Clinical Decision Making Stable/Uncomplicated    Rehab Potential Good    PT Frequency 2x / week    PT Duration 6 weeks    PT Treatment/Interventions ADLs/Self Care Home Management;Aquatic Therapy;Electrical Stimulation;Moist Heat;Cryotherapy;DME Instruction;Gait training;Stair training;Functional mobility training;Therapeutic activities;Therapeutic exercise;Balance training;Neuromuscular re-education;Patient/family education;Orthotic Fit/Training;Manual techniques;Taping;Vestibular;Passive range of motion;Dry needling    PT Next Visit Plan Posterior [place] with lateral t strap worked best on AFO trials but continue to work with pt in her ASO to see if we can improve strength enough that AFO may not be needed. She did bring her Bioness from home but had not worn in 2 years and was not charged. Has heel switch as well. May try in future but again feel strengthening first may be better option as weakness in knee and hip affecting her the most. Did OT get scheduled? Continue with hip abductor, extensor and quad and hamstring strengthening. Step-ups, gait with cane on varied surfaces, balance training.    Consulted and Agree with Plan of Care Patient             Patient will benefit from skilled therapeutic intervention in order to improve the following deficits and impairments:  Abnormal gait, Decreased coordination, Decreased range of motion, Difficulty walking, Pain, Postural dysfunction, Decreased strength, Decreased balance, Decreased knowledge of use of DME, Impaired tone, Decreased safety awareness, Decreased endurance, Decreased activity tolerance  Visit Diagnosis: Other abnormalities of gait and mobility  Muscle weakness (generalized)     Problem List Patient Active Problem List   Diagnosis Date Noted   Family history of heart disease 06/14/2020   History of CVA (cerebrovascular accident) 06/14/2020   Exudative age-related macular degeneration of right eye with active  choroidal neovascularization ([place]) 09/11/2019   Nocturnal hypoxemia 09/11/2019   Non-restorative sleep 06/13/2019   Benign myoclonus of drowsiness 06/13/2019   Excessive daytime sleepiness 06/13/2019   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   Hemiparesis affecting right side as late effect of cerebrovascular  accident ([place]) 06/13/2019   Macular degeneration of right eye 06/11/2019   Class 1 obesity due to excess calories with serious comorbidity and body mass index (BMI) of 31.0 to 31.9 in adult 06/11/2019   Moderate episode of recurrent major depressive disorder ([place]) 06/11/2019   Spastic hemiparesis ([place]) 03/25/2019   Prediabetes 01/01/2019   Acquired hypothyroidism 01/01/2019   Hypertension 01/01/2019   Aortic atherosclerosis ([place]) 11/07/2018   History of embolic stroke without residual deficits 03/29/2018   Seizure disorder ([place]) 01/12/2017   Common migraine 01/12/2017   Aneurysm ([place])    Brain aneurysm 08/21/2013   Right foot drop 04/10/2013   Intracerebral hemorrhage ([place]) 03/19/2013   Convulsions ([place]) 03/19/2013   Headache 03/19/2013   Abnormality of gait 03/19/2013   Edema 02/23/2011   COUGH 06/10/2008   HYPERLIPIDEMIA 04/25/2008   TOBACCO ABUSE 04/25/2008   ASTHMA 04/25/2008   Chronic obstructive pulmonary disease ([place]) 04/25/2008    Electa Sniff, PT, DPT, NCS 04/16/2021, 4:08 PM  [facility] [address] [place], [place], [address] Phone: [phone]   Fax:  [phone]  Name: [name] MRN: [id number] Date of Birth: 23-May-1956

## 2021-04-19 ENCOUNTER — Ambulatory Visit: Payer: Medicare Other

## 2021-04-19 ENCOUNTER — Other Ambulatory Visit: Payer: Self-pay

## 2021-04-19 DIAGNOSIS — R2681 Unsteadiness on feet: Secondary | ICD-10-CM | POA: Diagnosis not present

## 2021-04-19 DIAGNOSIS — M6281 Muscle weakness (generalized): Secondary | ICD-10-CM

## 2021-04-19 DIAGNOSIS — R2689 Other abnormalities of gait and mobility: Secondary | ICD-10-CM | POA: Diagnosis not present

## 2021-04-19 DIAGNOSIS — G8111 Spastic hemiplegia affecting right dominant side: Secondary | ICD-10-CM | POA: Diagnosis not present

## 2021-04-19 DIAGNOSIS — R262 Difficulty in walking, not elsewhere classified: Secondary | ICD-10-CM | POA: Diagnosis not present

## 2021-04-19 DIAGNOSIS — R278 Other lack of coordination: Secondary | ICD-10-CM | POA: Diagnosis not present

## 2021-04-19 NOTE — Therapy (Signed)
[facility] [address] [place], [place], [address] Phone: [phone]   Fax:  [phone]  Physical Therapy Treatment  Patient Details  Name: [name] MRN: [id number] Date of Birth: 02/01/1956 Referring Provider (PT): Frann Rider, NP   Encounter Date: 04/19/2021   PT End of Session - 04/19/21 1021     Visit Number 4    Number of Visits 13    Date for PT Re-Evaluation 05/21/21    Authorization Type Medicare A + B; [employer]    Progress Note Due on Visit 10    PT Start Time 1017    PT Stop Time 1059    PT Time Calculation (min) 42 min    Equipment Utilized During Treatment Gait belt    Activity Tolerance Patient tolerated treatment well    Behavior During Therapy WFL for tasks assessed/performed             Past Medical History:  Diagnosis Date   Abnormal gait    Amnesia    Anemia    hx   Aneurysm ([place])    Anxiety    Asthma    Atrophic vaginitis    Benign hypertensive heart disease    Cardiomegaly    Chest pain    COPD (chronic obstructive pulmonary disease) ([place])     - PFTs  01/24/06 FEV1 69% ratio 68% diffusing capacity 57% with no improvement after B2   - PFT's 10/1/ 09 FEV1 77   ratio  76    dlco                   45            no resp to B2    - Nl alpha one antitripsin level 10/09   Cough    Depression    Difficulty speaking    Disorder of nasal cavity    Dyspnea    Enthesopathy of elbow region    GERD (gastroesophageal reflux disease)    Hammer toe    Headache(784.0)    Hemiparesis affecting dominant side as late effect of cerebrovascular accident ([place])    History of kidney stones    Hyperlipidemia    Hypertension    Hypothyroidism    Increased urinary frequency    Low compliance bladder    Macular degeneration of both eyes    Malaise and fatigue    Nonruptured cerebral aneurysm    Obesity    Pneumonia    hx   Pure hypercholesterolemia    Recurrent major depressive  episodes ([place])    Seizure disorder, secondary ([place])    2-3 focal siezures monthly   Seizures ([place])    Short-term memory loss    since stroke   Skin sensation disturbance    Stroke ([place]) 2011   rt sided weakness   Swelling of limb    TMJ (temporomandibular joint disorder)    Urinary tract infectious disease    Vertigo as late effect of stroke    Vitamin D deficiency     Past Surgical History:  Procedure Laterality Date   BACK SURGERY     CEREBRAL ANGIOGRAM     x3   COLONOSCOPY WITH PROPOFOL N/A 08/18/2016   Procedure: COLONOSCOPY WITH PROPOFOL;  Surgeon: Carol Ada, MD;  Location: [facility];  Service: Endoscopy;  Laterality: N/A;   LAPAROSCOPIC HYSTERECTOMY     MANDIBLE FRACTURE SURGERY     RADIOLOGY WITH ANESTHESIA N/A 08/21/2013   Procedure:  RADIOLOGY WITH ANESTHESIA;  Surgeon: Rob Hickman, MD;  Location: [facility];  Service: Radiology;  Laterality: N/A;   stones     no surgery    There were no vitals filed for this visit.   Subjective Assessment - 04/19/21 1020     Subjective Denies fall. No new changes. No complaints. Did not get a chance to try the exercises.    Patient is accompained by: Family member   Husband   Pertinent History left frontoparietal parenchymal hemorrhage (2011), mild cognitive impairment, seizures, chronic headaches, cerebral aneurysm s/p stenting 2014, essential tremors, COPD and HTN, macular degeneration, Hypothyroidism, HLD, GERD, Hammer Toe, Depression, Cardiomegaly, Asthma, Anxiety and Anemia    Limitations Standing;Walking    Patient Stated Goals Improve Balance; Try to get toes in good position    Currently in Pain? Yes    Pain Score 4     Pain Location Knee    Pain Orientation Left    Pain Descriptors / Indicators Aching;Sore    Pain Onset More than a month ago                               [facility] Adult PT Treatment/Exercise - 04/19/21 0001       Transfers   Transfers Sit to Stand;Stand to Sit    Sit to  Stand 5: Supervision    Stand to Sit 5: Supervision    Comments completed sit <> stands with LLE placed on 2" block to promote weight shift onto RLE, completed 2 x 5 reps. cues for improved forward lean. intermittent rest breaks required.      Ambulation/Gait   Ambulation/Gait Yes    Ambulation/Gait Assistance 5: Supervision;4: Min guard    Ambulation/Gait Assistance Details throughout therapy gym with [facility] with activities.    Assistive device Straight cane    Gait Pattern Step-through pattern;Decreased arm swing - right;Decreased stance time - right;Decreased weight shift to right;Decreased hip/knee flexion - right    Ambulation Surface Level;Indoor      Exercises   Exercises Knee/Hip      Knee/Hip Exercises: Aerobic   Other Aerobic Completed SciFit for improved strengthening/activity tolerance with BUE/BLE x 6 minutes.      Knee/Hip Exercises: Standing   Lateral Step Up Right;1 set;10 reps;Hand Hold: 2;Step Height: 6"    Lateral Step Up Limitations cues for foot placement, and to weight shift onto RLE but avoid locking out into extension    Forward Step Up Right;1 set;10 reps;Hand Hold: 2;Step Height: 6";Limitations    Forward Step Up Limitations slowly reduced UE support. cues for technique and avoidance of recurvatum.    Other Standing Knee Exercises completed standing terminal knee extensions with red theraband and single UE support on LLE x 10 reps, cues for control and focus on improved activation of quad      Knee/Hip Exercises: Seated   Hamstring Curl AROM;Strengthening;Right;1 set;2 sets;10 reps;Limitations    Hamstring Limitations with RLE on pillowcase and red theraband for restistance completed 2 x 10 reps, focused on control. intermittent seated rest break.                 Balance Exercises - 04/19/21 0001       Balance Exercises: Standing   Marching Solid surface;Upper extremity assist 2;Static;Limitations    Marching Limitations completed standing static  marching x 10 reps focused on improved SLS and hip/knee flexion.  PT Education - 04/19/21 1157     Education Details Schedule OT eval    Person(s) Educated Patient;Spouse    Methods Explanation    Comprehension Verbalized understanding              PT Short Term Goals - 04/08/21 1135       PT SHORT TERM GOAL #1   Title Patient will be IND with initial HEP for strength/balance (ALL STGS Due: 04/30/21)    Baseline no HEP established    Time 3    Period Weeks    Status New    Target Date 04/30/21      PT SHORT TERM GOAL #2   Title Patient will report understanding of fall prevention within the home to promote improved safety    Baseline dependent    Time 3    Period Weeks    Status New      PT SHORT TERM GOAL #3   Title Patient will improve 5x sit <> stand to </= 20 seconds to demo improved balance and strength    Baseline 22 seconds with UE support    Time 3    Period Weeks    Status New      PT SHORT TERM GOAL #4   Title Patient will improve Berg Balance to >/= 35/56 to demo improved balance and reduced fall risk    Baseline 30/56    Time 3    Period Weeks    Status New               PT Long Term Goals - 04/08/21 1137       PT LONG TERM GOAL #1   Title Patient will be independent with final HEP for balance/strengthening (ALL LTGs Due: 05/21/21)    Baseline No HEP established    Time 6    Period Weeks    Status New    Target Date 05/21/21      PT LONG TERM GOAL #2   Title Patient will improve Berg Balance to >/= 40/56 to demo improved balance and reduced fall risk    Baseline 30/56    Time 6    Period Weeks    Status New      PT LONG TERM GOAL #3   Title Patient will improve 5x sit <> stand to </= 15 seconds to demo reduced fall risk    Baseline 22 secs    Time 6    Period Weeks    Status New      PT LONG TERM GOAL #4   Title Patient will improve gait speed to >/= 2.0 ft/sec with LRAD and potential AFO for improved  community mobility    Baseline 1.41 ft/sec    Time 6    Period Weeks    Status New      PT LONG TERM GOAL #5   Title Patient will improve FOTO >/= 55%    Baseline 48%    Time 6    Period Weeks    Status New                   Plan - 04/19/21 1157     Clinical Impression Statement Continued use of ASO on right ankle throughout session. Continued session focused on RLE strengthening and weight shift activites with patient tolerating well. Did require intemrittent breaks due to fatigue. Will continue to progress toward all LTGs.    Personal Factors and Comorbidities Comorbidity 3+  Comorbidities left frontoparietal parenchymal hemorrhage (2011), mild cognitive impairment, seizures, chronic headaches, cerebral aneurysm s/p stenting 2014, essential tremors, COPD and HTN, macular degeneration, Hypothyroidism, HLD, GERD, Hammer Toe, Depression, Cardiomegaly, Asthma, Anxiety and Anemia    Examination-Activity Limitations Transfers;Locomotion Level;Bend;Dressing;Stand;Stairs;Reach Overhead    Examination-Participation Restrictions Cleaning;Meal Prep;Community Activity    Stability/Clinical Decision Making Stable/Uncomplicated    Rehab Potential Good    PT Frequency 2x / week    PT Duration 6 weeks    PT Treatment/Interventions ADLs/Self Care Home Management;Aquatic Therapy;Electrical Stimulation;Moist Heat;Cryotherapy;DME Instruction;Gait training;Stair training;Functional mobility training;Therapeutic activities;Therapeutic exercise;Balance training;Neuromuscular re-education;Patient/family education;Orthotic Fit/Training;Manual techniques;Taping;Vestibular;Passive range of motion;Dry needling    PT Next Visit Plan Did we get OT scheduled? Posterior ottobock with lateral t strap worked best on AFO trials but continue to work with pt in her ASO to see if we can improve strength enough that AFO may not be needed. She did bring her Bioness from home but had not worn in 2 years and was not  charged. Has heel switch as well. May try in future but again feel strengthening first may be better option as weakness in knee and hip affecting her the most. Continue with hip abductor, extensor and quad and hamstring strengthening. Step-ups, gait with cane on varied surfaces, balance training.    Consulted and Agree with Plan of Care Patient             Patient will benefit from skilled therapeutic intervention in order to improve the following deficits and impairments:  Abnormal gait, Decreased coordination, Decreased range of motion, Difficulty walking, Pain, Postural dysfunction, Decreased strength, Decreased balance, Decreased knowledge of use of DME, Impaired tone, Decreased safety awareness, Decreased endurance, Decreased activity tolerance  Visit Diagnosis: Other abnormalities of gait and mobility  Muscle weakness (generalized)  Unsteadiness on feet  Difficulty in walking, not elsewhere classified     Problem List Patient Active Problem List   Diagnosis Date Noted   Family history of heart disease 06/14/2020   History of CVA (cerebrovascular accident) 06/14/2020   Exudative age-related macular degeneration of right eye with active choroidal neovascularization ([place]) 09/11/2019   Nocturnal hypoxemia 09/11/2019   Non-restorative sleep 06/13/2019   Benign myoclonus of drowsiness 06/13/2019   Excessive daytime sleepiness 06/13/2019   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   Hemiparesis affecting right side as late effect of cerebrovascular accident ([place]) 06/13/2019   Macular degeneration of right eye 06/11/2019   Class 1 obesity due to excess calories with serious comorbidity and body mass index (BMI) of 31.0 to 31.9 in adult 06/11/2019   Moderate episode of recurrent major depressive disorder ([place]) 06/11/2019   Spastic hemiparesis ([place]) 03/25/2019   Prediabetes 01/01/2019   Acquired hypothyroidism 01/01/2019   Hypertension 01/01/2019   Aortic  atherosclerosis ([place]) 11/07/2018   History of embolic stroke without residual deficits 03/29/2018   Seizure disorder ([place]) 01/12/2017   Common migraine 01/12/2017   Aneurysm ([place])    Brain aneurysm 08/21/2013   Right foot drop 04/10/2013   Intracerebral hemorrhage ([place]) 03/19/2013   Convulsions ([place]) 03/19/2013   Headache 03/19/2013   Abnormality of gait 03/19/2013   Edema 02/23/2011   COUGH 06/10/2008   HYPERLIPIDEMIA 04/25/2008   TOBACCO ABUSE 04/25/2008   ASTHMA 04/25/2008   Chronic obstructive pulmonary disease ([place]) 04/25/2008    Jones Bales, PT, DPT 04/19/2021, 12:00 PM  [facility] [address] [place], [place], [address] Phone: [phone]   Fax:  [phone]  Name: [name]  [place] MRN: [id number] Date of Birth: 01/13/1956

## 2021-04-21 ENCOUNTER — Ambulatory Visit: Payer: Medicare Other | Admitting: Occupational Therapy

## 2021-04-21 ENCOUNTER — Encounter (HOSPITAL_BASED_OUTPATIENT_CLINIC_OR_DEPARTMENT_OTHER): Payer: Self-pay

## 2021-04-21 ENCOUNTER — Other Ambulatory Visit: Payer: Self-pay

## 2021-04-21 ENCOUNTER — Ambulatory Visit: Payer: Medicare Other

## 2021-04-21 DIAGNOSIS — R262 Difficulty in walking, not elsewhere classified: Secondary | ICD-10-CM

## 2021-04-21 DIAGNOSIS — R2689 Other abnormalities of gait and mobility: Secondary | ICD-10-CM

## 2021-04-21 DIAGNOSIS — M6281 Muscle weakness (generalized): Secondary | ICD-10-CM

## 2021-04-21 DIAGNOSIS — R278 Other lack of coordination: Secondary | ICD-10-CM

## 2021-04-21 DIAGNOSIS — R2681 Unsteadiness on feet: Secondary | ICD-10-CM

## 2021-04-21 DIAGNOSIS — G8111 Spastic hemiplegia affecting right dominant side: Secondary | ICD-10-CM | POA: Diagnosis not present

## 2021-04-21 DIAGNOSIS — R208 Other disturbances of skin sensation: Secondary | ICD-10-CM

## 2021-04-21 NOTE — Therapy (Signed)
[facility] [address] [place], [place], [address] Phone: [phone]   Fax:  [phone]  Physical Therapy Treatment  Patient Details  Name: [name] MRN: [id number] Date of Birth: 10/02/1955 Referring Provider (PT): Frann Rider, NP   Encounter Date: 04/21/2021   PT End of Session - 04/21/21 1021     Visit Number 5    Number of Visits 13    Date for PT Re-Evaluation 05/21/21    Authorization Type Medicare A + B; [employer]    Progress Note Due on Visit 10    PT Start Time 1017    PT Stop Time 1100    PT Time Calculation (min) 43 min    Equipment Utilized During Treatment Gait belt    Activity Tolerance Patient tolerated treatment well    Behavior During Therapy WFL for tasks assessed/performed             Past Medical History:  Diagnosis Date   Abnormal gait    Amnesia    Anemia    hx   Aneurysm ([place])    Anxiety    Asthma    Atrophic vaginitis    Benign hypertensive heart disease    Cardiomegaly    Chest pain    COPD (chronic obstructive pulmonary disease) ([place])     - PFTs  01/24/06 FEV1 69% ratio 68% diffusing capacity 57% with no improvement after B2   - PFT's 10/1/ 09 FEV1 77   ratio  76    dlco                   45            no resp to B2    - Nl alpha one antitripsin level 10/09   Cough    Depression    Difficulty speaking    Disorder of nasal cavity    Dyspnea    Enthesopathy of elbow region    GERD (gastroesophageal reflux disease)    Hammer toe    Headache(784.0)    Hemiparesis affecting dominant side as late effect of cerebrovascular accident ([place])    History of kidney stones    Hyperlipidemia    Hypertension    Hypothyroidism    Increased urinary frequency    Low compliance bladder    Macular degeneration of both eyes    Malaise and fatigue    Nonruptured cerebral aneurysm    Obesity    Pneumonia    hx   Pure hypercholesterolemia    Recurrent major depressive  episodes ([place])    Seizure disorder, secondary ([place])    2-3 focal siezures monthly   Seizures ([place])    Short-term memory loss    since stroke   Skin sensation disturbance    Stroke ([place]) 2011   rt sided weakness   Swelling of limb    TMJ (temporomandibular joint disorder)    Urinary tract infectious disease    Vertigo as late effect of stroke    Vitamin D deficiency     Past Surgical History:  Procedure Laterality Date   BACK SURGERY     CEREBRAL ANGIOGRAM     x3   COLONOSCOPY WITH PROPOFOL N/A 08/18/2016   Procedure: COLONOSCOPY WITH PROPOFOL;  Surgeon: Carol Ada, MD;  Location: [facility];  Service: Endoscopy;  Laterality: N/A;   LAPAROSCOPIC HYSTERECTOMY     MANDIBLE FRACTURE SURGERY     RADIOLOGY WITH ANESTHESIA N/A 08/21/2013   Procedure:  RADIOLOGY WITH ANESTHESIA;  Surgeon: Rob Hickman, MD;  Location: [facility];  Service: Radiology;  Laterality: N/A;   stones     no surgery    There were no vitals filed for this visit.   Subjective Assessment - 04/21/21 1018     Subjective Patient reports no new changes/complaints. No falls. Got to try all of the exercises, except the one with stepping up.    Patient is accompained by: Family member   Husband   Pertinent History left frontoparietal parenchymal hemorrhage (2011), mild cognitive impairment, seizures, chronic headaches, cerebral aneurysm s/p stenting 2014, essential tremors, COPD and HTN, macular degeneration, Hypothyroidism, HLD, GERD, Hammer Toe, Depression, Cardiomegaly, Asthma, Anxiety and Anemia    Limitations Standing;Walking    Patient Stated Goals Improve Balance; Try to get toes in good position    Currently in Pain? Yes    Pain Score 3     Pain Location Foot    Pain Orientation Right;Lateral    Pain Descriptors / Indicators Aching;Sore    Pain Type Chronic pain    Pain Onset More than a month ago                  [facility] Adult PT Treatment/Exercise - 04/21/21 0001       Transfers    Transfers Sit to Stand;Stand to Sit    Sit to Stand 5: Supervision    Stand to Sit 5: Supervision      Ambulation/Gait   Ambulation/Gait Yes    Ambulation/Gait Assistance 5: Supervision    Ambulation/Gait Assistance Details throughout therapy gym with [facility] with quad tip    Assistive device Straight cane    Gait Pattern Step-through pattern;Decreased arm swing - right;Decreased stance time - right;Decreased weight shift to right;Decreased hip/knee flexion - right    Ambulation Surface Level;Indoor      Neuro Re-ed    Neuro Re-ed Details  completed toe taps to 4" step with single UE support on L side x 10 reps bilaterally, then progressed to no UE support x 10 reps with CGA, one LOB noted. Cues for imrpoved hip/knee flexion to promote clearance on RLE. Standing staggered stance with single UE support on L Side, completed weight shift A/P in diagonal position x 10 reps alteranting with each set. Increased challenge with anterior weight shift onto RLE.      Exercises   Exercises Knee/Hip      Knee/Hip Exercises: Aerobic   Other Aerobic Completed SciFit for improved strengthening/activity tolerance with BUE/BLE on Level 2.0 x 5 minutes. First two minutes utilized UE, last 3 minutes no UE support.      Knee/Hip Exercises: Standing   Forward Step Up Right;1 set;10 reps;Hand Hold: 1;Step Height: 4"    Forward Step Up Limitations completed at first step with single UE on L Side, mild imbalance due to reduced UE support completed x 10 reps. patient asking about completing at home, PT educating to hold on this exercise at this time due to imbalance.                 Balance Exercises - 04/21/21 0001       Balance Exercises: Standing   Standing Eyes Opened Narrow base of support (BOS);Head turns;Solid surface;Limitations    Standing Eyes Opened Limitations standing with eyes open 2 x 30 seconds; then progressed to addition of horizontal/vertical head turns x 10 reps each. increased challenge  with vertical > horizontal.    Standing Eyes Closed Wide (BOA);Narrow  base of support (BOS);Solid surface;Limitations    Standing Eyes Closed Limitations intermittent touch to chair placed on L side, slow progression from wide BOS to narrow BOS 4 x 10-15 seconds.                 PT Short Term Goals - 04/08/21 1135       PT SHORT TERM GOAL #1   Title Patient will be IND with initial HEP for strength/balance (ALL STGS Due: 04/30/21)    Baseline no HEP established    Time 3    Period Weeks    Status New    Target Date 04/30/21      PT SHORT TERM GOAL #2   Title Patient will report understanding of fall prevention within the home to promote improved safety    Baseline dependent    Time 3    Period Weeks    Status New      PT SHORT TERM GOAL #3   Title Patient will improve 5x sit <> stand to </= 20 seconds to demo improved balance and strength    Baseline 22 seconds with UE support    Time 3    Period Weeks    Status New      PT SHORT TERM GOAL #4   Title Patient will improve Berg Balance to >/= 35/56 to demo improved balance and reduced fall risk    Baseline 30/56    Time 3    Period Weeks    Status New               PT Long Term Goals - 04/08/21 1137       PT LONG TERM GOAL #1   Title Patient will be independent with final HEP for balance/strengthening (ALL LTGs Due: 05/21/21)    Baseline No HEP established    Time 6    Period Weeks    Status New    Target Date 05/21/21      PT LONG TERM GOAL #2   Title Patient will improve Berg Balance to >/= 40/56 to demo improved balance and reduced fall risk    Baseline 30/56    Time 6    Period Weeks    Status New      PT LONG TERM GOAL #3   Title Patient will improve 5x sit <> stand to </= 15 seconds to demo reduced fall risk    Baseline 22 secs    Time 6    Period Weeks    Status New      PT LONG TERM GOAL #4   Title Patient will improve gait speed to >/= 2.0 ft/sec with LRAD and potential AFO for  improved community mobility    Baseline 1.41 ft/sec    Time 6    Period Weeks    Status New      PT LONG TERM GOAL #5   Title Patient will improve FOTO >/= 55%    Baseline 48%    Time 6    Period Weeks    Status New                   Plan - 04/21/21 1152     Clinical Impression Statement Today's skilled PT session focused on continued RLE strengthening, but main foucs on balance activities with reduced UE support and promoting improved weight shift/stance onto RLE. Patient require intermittent CGA throughout session. Will continue per POC.    Personal Factors and Comorbidities Comorbidity  3+    Comorbidities left frontoparietal parenchymal hemorrhage (2011), mild cognitive impairment, seizures, chronic headaches, cerebral aneurysm s/p stenting 2014, essential tremors, COPD and HTN, macular degeneration, Hypothyroidism, HLD, GERD, Hammer Toe, Depression, Cardiomegaly, Asthma, Anxiety and Anemia    Examination-Activity Limitations Transfers;Locomotion Level;Bend;Dressing;Stand;Stairs;Reach Overhead    Examination-Participation Restrictions Cleaning;Meal Prep;Community Activity    Stability/Clinical Decision Making Stable/Uncomplicated    Rehab Potential Good    PT Frequency 2x / week    PT Duration 6 weeks    PT Treatment/Interventions ADLs/Self Care Home Management;Aquatic Therapy;Electrical Stimulation;Moist Heat;Cryotherapy;DME Instruction;Gait training;Stair training;Functional mobility training;Therapeutic activities;Therapeutic exercise;Balance training;Neuromuscular re-education;Patient/family education;Orthotic Fit/Training;Manual techniques;Taping;Vestibular;Passive range of motion;Dry needling    PT Next Visit Plan Continue with hip abductor, extensor and quad and hamstring strengthening. Step-ups, gait with cane on varied surfaces, balance training. -- Posterior ottobock with lateral t strap worked best on AFO trials but continue to work with pt in her ASO to see if we  can improve strength enough that AFO may not be needed. She did bring her Bioness from home but had not worn in 2 years and was not charged. Has heel switch as well. May try in future but again feel strengthening first may be better option as weakness in knee and hip affecting her the most.    Consulted and Agree with Plan of Care Patient             Patient will benefit from skilled therapeutic intervention in order to improve the following deficits and impairments:  Abnormal gait, Decreased coordination, Decreased range of motion, Difficulty walking, Pain, Postural dysfunction, Decreased strength, Decreased balance, Decreased knowledge of use of DME, Impaired tone, Decreased safety awareness, Decreased endurance, Decreased activity tolerance  Visit Diagnosis: Other abnormalities of gait and mobility  Muscle weakness (generalized)  Unsteadiness on feet  Difficulty in walking, not elsewhere classified     Problem List Patient Active Problem List   Diagnosis Date Noted   Family history of heart disease 06/14/2020   History of CVA (cerebrovascular accident) 06/14/2020   Exudative age-related macular degeneration of right eye with active choroidal neovascularization ([place]) 09/11/2019   Nocturnal hypoxemia 09/11/2019   Non-restorative sleep 06/13/2019   Benign myoclonus of drowsiness 06/13/2019   Excessive daytime sleepiness 06/13/2019   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   Hemiparesis affecting right side as late effect of cerebrovascular accident ([place]) 06/13/2019   Macular degeneration of right eye 06/11/2019   Class 1 obesity due to excess calories with serious comorbidity and body mass index (BMI) of 31.0 to 31.9 in adult 06/11/2019   Moderate episode of recurrent major depressive disorder ([place]) 06/11/2019   Spastic hemiparesis ([place]) 03/25/2019   Prediabetes 01/01/2019   Acquired hypothyroidism 01/01/2019   Hypertension 01/01/2019   Aortic atherosclerosis  ([place]) 11/07/2018   History of embolic stroke without residual deficits 03/29/2018   Seizure disorder ([place]) 01/12/2017   Common migraine 01/12/2017   Aneurysm ([place])    Brain aneurysm 08/21/2013   Right foot drop 04/10/2013   Intracerebral hemorrhage ([place]) 03/19/2013   Convulsions ([place]) 03/19/2013   Headache 03/19/2013   Abnormality of gait 03/19/2013   Edema 02/23/2011   COUGH 06/10/2008   HYPERLIPIDEMIA 04/25/2008   TOBACCO ABUSE 04/25/2008   ASTHMA 04/25/2008   Chronic obstructive pulmonary disease ([place]) 04/25/2008    Jones Bales, PT, DPT 04/21/2021, 11:56 AM  [facility] [address] [place], [place], [address] Phone: [phone]   Fax:  [phone]  Name: Wendy Kniceley  [name] MRN: [id number] Date of Birth: 1956-06-17

## 2021-04-21 NOTE — Therapy (Signed)
[facility] [address] [place], [place], [address] Phone: [phone]   Fax:  [phone]  Occupational Therapy Evaluation  Patient Details  Name: [name] MRN: [id number] Date of Birth: Dec 28, 1955 No data recorded  Encounter Date: 04/21/2021   OT End of Session - 04/21/21 1206     Visit Number 1    Number of Visits 17    Date for OT Re-Evaluation 06/21/21    Authorization Type MCR    Progress Note Due on Visit 10    OT Start Time 1100    OT Stop Time 1145    OT Time Calculation (min) 45 min    Activity Tolerance Patient tolerated treatment well    Behavior During Therapy [facility] for tasks assessed/performed             Past Medical History:  Diagnosis Date   Abnormal gait    Amnesia    Anemia    hx   Aneurysm ([place])    Anxiety    Asthma    Atrophic vaginitis    Benign hypertensive heart disease    Cardiomegaly    Chest pain    COPD (chronic obstructive pulmonary disease) ([place])     - PFTs  01/24/06 FEV1 69% ratio 68% diffusing capacity 57% with no improvement after B2   - PFT's 10/1/ 09 FEV1 77   ratio  76    dlco                   45            no resp to B2    - Nl alpha one antitripsin level 10/09   Cough    Depression    Difficulty speaking    Disorder of nasal cavity    Dyspnea    Enthesopathy of elbow region    GERD (gastroesophageal reflux disease)    Hammer toe    Headache(784.0)    Hemiparesis affecting dominant side as late effect of cerebrovascular accident ([place])    History of kidney stones    Hyperlipidemia    Hypertension    Hypothyroidism    Increased urinary frequency    Low compliance bladder    Macular degeneration of both eyes    Malaise and fatigue    Nonruptured cerebral aneurysm    Obesity    Pneumonia    hx   Pure hypercholesterolemia    Recurrent major depressive episodes ([place])    Seizure disorder, secondary ([place])    2-3 focal siezures monthly   Seizures  ([place])    Short-term memory loss    since stroke   Skin sensation disturbance    Stroke ([place]) 2011   rt sided weakness   Swelling of limb    TMJ (temporomandibular joint disorder)    Urinary tract infectious disease    Vertigo as late effect of stroke    Vitamin D deficiency     Past Surgical History:  Procedure Laterality Date   BACK SURGERY     CEREBRAL ANGIOGRAM     x3   COLONOSCOPY WITH PROPOFOL N/A 08/18/2016   Procedure: COLONOSCOPY WITH PROPOFOL;  Surgeon: Carol Ada, MD;  Location: [facility];  Service: Endoscopy;  Laterality: N/A;   LAPAROSCOPIC HYSTERECTOMY     MANDIBLE FRACTURE SURGERY     RADIOLOGY WITH ANESTHESIA N/A 08/21/2013   Procedure: RADIOLOGY WITH ANESTHESIA;  Surgeon: Rob Hickman, MD;  Location: [facility];  Service: Radiology;  Laterality: N/A;   stones     no surgery    There were no vitals filed for this visit.   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 Yes   neck, lower back, and Lt knee - O.T. not addressing due to outside scope of practice              [facility] OT Assessment - 04/21/21 0001       Assessment   Medical Diagnosis Rt spastic hemiparesis   from stroke in 2011   Onset Date/Surgical Date 03/29/21   referral date   Hand Dominance Right   has learned to use Lt hand   Prior Therapy known to this clinic for prior PT      Precautions   Precautions Fall    Precaution Comments h/o seizures      Balance Screen   Has the patient fallen in the past 6 months Yes     How many times? 2   Pt currently seeing P.T.     Home  Environment   Bathroom Shower/Tub Walk-in Shower   shower chair   Additional Comments Pt lives in 1 story home with 6-7 steps to enter w/ railing on Rt side going up. (bonus room on 2nd floor that pt never uses)    Lives With Spouse      Prior Function   Level of Independence Needs assistance with ADLs   assist w/ bra and buttons   Vocation On disability    Comments limited driving      ADL   Eating/Feeding Needs assist with cutting food   using Lt non dominant hand   Grooming Modified independent   using non dominant hand, electric toothbrush   Upper Body Bathing Modified independent   seated w/ gloves   Lower Body Bathing Modified independent   seated w/ gloves   Upper Body Dressing Minimal assistance   assist w/ bra and buttons if button up shirt   Lower Body Dressing Modified independent   slip on pants, occasional assist w/ tying   Toilet Transfer Independent    Toileting - Clothing Manipulation Independent    Toileting -  Hygiene Independent    Tub/Shower Transfer Modified independent      IADL   Shopping --   Husband does, pt may accompany him   Light Housekeeping Performs light daily tasks such as dishwashing, bed making   folds clothes, dusting   Meal Prep Able to complete simple warm meal prep;Able to complete simple cold meal and snack prep   needs help to get things in/out of oven, chopping, peeling   Community Mobility --   limited driving   Medication Management Is responsible for taking medication in correct dosages at correct time    Financial Management Manages day-to-day purchases, but needs help with banking, major purchases, etc.      Mobility   Mobility Status Needs assist    Mobility Status Comments walks w/ cane  Written Expression   Dominant Hand Right   but has been using Lt hand for eating, grooming, and writing     Vision - History   Baseline Vision Wears glasses all the time    Visual  History Macular degeneration   wet (Rt eye)     Cognition   Cognition Comments short term memory impaired since stroke, decreased tolerance to stimulation w/ limited driving      Sensation   Light Touch Impaired by gross assessment    Additional Comments pins/needling throughout RUE/RLE; worse numbness and tingling Rt hand since fall in Dec 2021. Decreased ability to detect amount of pressure applied through hand/fingers      Coordination   Gross Motor Movements are Fluid and Coordinated No    Fine Motor Movements are Fluid and Coordinated No    9 Hole Peg Test Right;Left    Right 9 Hole Peg Test 78.87 sec    Left 9 Hole Peg Test 30.47 sec    Box and Blocks Rt = 18    Tremors essential tremors    Coordination ataxic movements, dystonia RUE (more distally)      Edema   Edema mild      ROM / Strength   AROM / PROM / Strength AROM      AROM   Overall AROM Comments BUE AROM WFL's w/ difficulty RUE end range and some ataxic movements and dystonia      Hand Function   Right Hand Grip (lbs) 28.4 lbs    Left Hand Grip (lbs) 39.2 lbs                               OT Short Term Goals - 04/21/21 1215       OT SHORT TERM GOAL #1   Title Independent with coordination HEP and functional HEP for Rt hand/RUE    Time 4    Period Weeks    Status New      OT SHORT TERM GOAL #2   Title Pt to verbalize understanding of safety considerations due to lack of sensation RUE    Time 4    Period Weeks    Status New      OT SHORT TERM GOAL #3   Title Pt to improve RUE function as evidenced by performing 22 on Box & Blocks test    Baseline 18    Time 4    Period Weeks    Status New      OT SHORT TERM GOAL #4   Title Pt to feed self 25% with Rt dominant hand    Baseline all Lt hand    Time 4    Period Weeks    Status New      OT SHORT TERM GOAL #5   Title Pt to write name consistently with Rt hand    Baseline Lt hand    Time 4    Period Weeks    Status New                OT Long Term Goals - 04/21/21 1217       OT LONG TERM GOAL #1   Title Pt independent with updated HEP to including weight bearing    Time 8    Period Weeks    Status New      OT LONG TERM GOAL #2   Title Improve coordination Rt hand as evidenced by  performing 9 hole peg test in 65 sec or less    Baseline 78.87 sec    Time 8    Period Weeks    Status New      OT LONG TERM GOAL #3   Title Pt to consistently feed self at 50% or greater with RUE    Time 8    Period Weeks    Status New      OT LONG TERM GOAL #4   Title Pt to groom self 25% or greater with RUE    Time 8    Period Weeks    Status New                   Plan - 04/21/21 1207     Clinical Impression Statement Pt is a 65 y.o. 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 Pt has learned non use Rt dominant hand, decreased coordination, dystonia, mild ataxic movements, and decreased sensation RUE. Pt reports decreased use of Rt hand since fall in Dec 2021. PMH: left frontoparietal parenchymal hemorrhage (2011), mild cognitive impairment, seizures, chronic headaches, cerebral aneurysm s/p stenting 2014, essential tremors, COPD and HTN, macular degeneration, Hypothyroidism, HLD, GERD, Hammer Toe, Depression, Cardiomegaly, Asthma, Anxiety and Anemia. Receive tibial nerve block by Dr. Letta Pate on 09/29/2020. Pt would benefit from skilled O.T. to address above deficits, develop adaptations/strategies and introduce A/E prn, and increase functional use of Rt dominant UE    OT Occupational Profile and History Detailed Assessment- Review of Records and additional review of physical, cognitive, psychosocial history related to current functional performance    Occupational performance deficits (Please refer to evaluation for details): ADL's;IADL's;Social Participation    Body Structure / Function / Physical Skills ADL;Decreased knowledge of use of  DME;Strength;Dexterity;GMC;Proprioception;UE functional use;Edema;Body mechanics;IADL;Coordination;Mobility;FMC    Cognitive Skills Memory;Safety Awareness;Problem Solve;Attention    Rehab Potential Fair   time since onset, learned non use   Clinical Decision Making Several treatment options, min-mod task modification necessary    Comorbidities Affecting Occupational Performance: May have comorbidities impacting occupational performance    Modification or Assistance to Complete Evaluation  No modification of tasks or assist necessary to complete eval    OT Frequency 2x / week    OT Duration 8 weeks   plus eval (anticipate only 6 weeks needed)   OT Treatment/Interventions Self-care/ADL training;Fluidtherapy;DME and/or AE instruction;Splinting;Therapeutic activities;Therapeutic exercise;Cognitive remediation/compensation;Coping strategies training;Neuromuscular education;Functional Mobility Training;Passive range of motion;Visual/perceptual remediation/compensation;Patient/family education;Manual Therapy    Plan coordination HEP for Rt hand, safety considerations d/t lack of sensation Rt hand    Consulted and Agree with Plan of Care Patient;Family member/caregiver    Family Member Consulted husband             Patient will benefit from skilled therapeutic intervention in order to improve the following deficits and impairments:   Body Structure / Function / Physical Skills: ADL, Decreased knowledge of use of DME, Strength, Dexterity, GMC, Proprioception, UE functional use, Edema, Body mechanics, IADL, Coordination, Mobility, FMC Cognitive Skills: Memory, Safety Awareness, Problem Solve, Attention     Visit Diagnosis: Other lack of coordination - Plan: Ot plan of care cert/re-cert  Muscle weakness (generalized) - Plan: Ot plan of care cert/re-cert  Unsteadiness on feet - Plan: Ot plan of care cert/re-cert  Right spastic hemiparesis ([place]) - Plan: Ot plan of care cert/re-cert  Other  disturbances of skin sensation - Plan: Ot plan of care cert/re-cert    Problem List Patient Active  Problem List   Diagnosis Date Noted   Family history of heart disease 06/14/2020   History of CVA (cerebrovascular accident) 06/14/2020   Exudative age-related macular degeneration of right eye with active choroidal neovascularization ([place]) 09/11/2019   Nocturnal hypoxemia 09/11/2019   Non-restorative sleep 06/13/2019   Benign myoclonus of drowsiness 06/13/2019   Excessive daytime sleepiness 06/13/2019   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   Hemiparesis affecting right side as late effect of cerebrovascular accident ([place]) 06/13/2019   Macular degeneration of right eye 06/11/2019   Class 1 obesity due to excess calories with serious comorbidity and body mass index (BMI) of 31.0 to 31.9 in adult 06/11/2019   Moderate episode of recurrent major depressive disorder ([place]) 06/11/2019   Spastic hemiparesis ([place]) 03/25/2019   Prediabetes 01/01/2019   Acquired hypothyroidism 01/01/2019   Hypertension 01/01/2019   Aortic atherosclerosis ([place]) 11/07/2018   History of embolic stroke without residual deficits 03/29/2018   Seizure disorder ([place]) 01/12/2017   Common migraine 01/12/2017   Aneurysm ([place])    Brain aneurysm 08/21/2013   Right foot drop 04/10/2013   Intracerebral hemorrhage ([place]) 03/19/2013   Convulsions ([place]) 03/19/2013   Headache 03/19/2013   Abnormality of gait 03/19/2013   Edema 02/23/2011   COUGH 06/10/2008   HYPERLIPIDEMIA 04/25/2008   TOBACCO ABUSE 04/25/2008   ASTHMA 04/25/2008   Chronic obstructive pulmonary disease ([place]) 04/25/2008    Carey Bullocks, OTR/L 04/21/2021, 12:29 PM  [facility] [address] [place], [place], [address] Phone: [phone]   Fax:  [phone]  Name: ANZLEY BOLENBAUGH MRN: [id number] Date of Birth: 04-15-1956

## 2021-04-22 ENCOUNTER — Ambulatory Visit: Payer: Medicare Other | Admitting: Occupational Therapy

## 2021-04-22 DIAGNOSIS — R262 Difficulty in walking, not elsewhere classified: Secondary | ICD-10-CM | POA: Diagnosis not present

## 2021-04-22 DIAGNOSIS — R2689 Other abnormalities of gait and mobility: Secondary | ICD-10-CM | POA: Diagnosis not present

## 2021-04-22 DIAGNOSIS — G8111 Spastic hemiplegia affecting right dominant side: Secondary | ICD-10-CM | POA: Diagnosis not present

## 2021-04-22 DIAGNOSIS — R278 Other lack of coordination: Secondary | ICD-10-CM | POA: Diagnosis not present

## 2021-04-22 DIAGNOSIS — M6281 Muscle weakness (generalized): Secondary | ICD-10-CM | POA: Diagnosis not present

## 2021-04-22 DIAGNOSIS — R2681 Unsteadiness on feet: Secondary | ICD-10-CM | POA: Diagnosis not present

## 2021-04-22 NOTE — Patient Instructions (Signed)
  Coordination Activities  Perform the following activities for 15-30 minutes 2 times per day with right hand(s).  Flip cards 1 at a time as fast as you can. Pick up coins or checkers and place in a cup.  Pick up coins/checkers and stack. Practice writing name and address. Wipe table Eat with Rt hand (all finger foods and 50% or greater with utensil) Bring empty cup or cup w/ lid to mouth and replace on table x 10 Brush hair Put away light groceries and plastic dishes (with left hand holding counter for balance!)  Open drawers and lower cabinets   Use BOTH hands to:   1) fold laundry 2) make sandwich   Do NOT use Rt hand for anything: sharp, heavy, breakable, or hot Use lid for all drinks if carrying with Rt hand Look at hand when using it! Test temperature of water (for bathing, washing hands, washing dishes, etc) with Lt arm/hand

## 2021-04-22 NOTE — Therapy (Signed)
[facility] [address] [place], [place], [address] Phone: [phone]   Fax:  [phone]  Occupational Therapy Treatment  Patient Details  Name: [name] MRN: [id number] Date of Birth: 1956-03-08 No data recorded  Encounter Date: 04/22/2021   OT End of Session - 04/22/21 1359     Visit Number 2    Number of Visits 17    Date for OT Re-Evaluation 06/21/21    Authorization Type MCR    Progress Note Due on Visit 10    OT Start Time 1315    OT Stop Time 1400    OT Time Calculation (min) 45 min    Activity Tolerance Patient tolerated treatment well    Behavior During Therapy [facility] for tasks assessed/performed             Past Medical History:  Diagnosis Date   Abnormal gait    Amnesia    Anemia    hx   Aneurysm ([place])    Anxiety    Asthma    Atrophic vaginitis    Benign hypertensive heart disease    Cardiomegaly    Chest pain    COPD (chronic obstructive pulmonary disease) ([place])     - PFTs  01/24/06 FEV1 69% ratio 68% diffusing capacity 57% with no improvement after B2   - PFT's 10/1/ 09 FEV1 77   ratio  76    dlco                   45            no resp to B2    - Nl alpha one antitripsin level 10/09   Cough    Depression    Difficulty speaking    Disorder of nasal cavity    Dyspnea    Enthesopathy of elbow region    GERD (gastroesophageal reflux disease)    Hammer toe    Headache(784.0)    Hemiparesis affecting dominant side as late effect of cerebrovascular accident ([place])    History of kidney stones    Hyperlipidemia    Hypertension    Hypothyroidism    Increased urinary frequency    Low compliance bladder    Macular degeneration of both eyes    Malaise and fatigue    Nonruptured cerebral aneurysm    Obesity    Pneumonia    hx   Pure hypercholesterolemia    Recurrent major depressive episodes ([place])    Seizure disorder, secondary ([place])    2-3 focal siezures monthly   Seizures  ([place])    Short-term memory loss    since stroke   Skin sensation disturbance    Stroke ([place]) 2011   rt sided weakness   Swelling of limb    TMJ (temporomandibular joint disorder)    Urinary tract infectious disease    Vertigo as late effect of stroke    Vitamin D deficiency     Past Surgical History:  Procedure Laterality Date   BACK SURGERY     CEREBRAL ANGIOGRAM     x3   COLONOSCOPY WITH PROPOFOL N/A 08/18/2016   Procedure: COLONOSCOPY WITH PROPOFOL;  Surgeon: Carol Ada, MD;  [facility];  Service: Endoscopy;  Laterality: N/A;   LAPAROSCOPIC HYSTERECTOMY     MANDIBLE FRACTURE SURGERY     RADIOLOGY WITH ANESTHESIA N/A 08/21/2013   Procedure: RADIOLOGY WITH ANESTHESIA;  Surgeon: Rob Hickman, MD;  Location: [facility];  Service: Radiology;  Laterality: N/A;   stones     no surgery    There were no vitals filed for this visit.   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 Yes    Pain Score 4     Pain Location Hand    Pain Orientation Right    Pain Descriptors / Indicators Sore;Numbness    Pain Type Chronic pain    Pain Onset More than a month ago    Pain Frequency Constant    Aggravating Factors  nothing    Pain Relieving Factors stretching it             Reviewed goals/POC with pt/family.  Discussed safety considerations for lack of sensation Rt hand - see pt instructions.  Also see pt instructions for details on coordination HEP and functional use activities for Rt hand.   Pt practiced writing name and address in print with and without built up  pen with 100% legibility using built up pen. Pt issued tan and red foam.                      OT Education - 04/22/21 1338     Education Details coordination activities and functional activities for Rt hand, safety considerations for lack of sensation    Person(s) Educated Patient;Spouse    Methods Explanation;Demonstration;Handout;Verbal cues    Comprehension Verbalized understanding;Returned demonstration              OT Short Term Goals - 04/22/21 1400       OT SHORT TERM GOAL #1   Title Independent with coordination HEP and functional HEP for Rt hand/RUE    Time 4    Period Weeks    Status On-going      OT SHORT TERM GOAL #2   Title Pt to verbalize understanding of safety considerations due to lack of sensation RUE    Time 4    Period Weeks    Status On-going      OT SHORT TERM GOAL #3   Title Pt to improve RUE function as evidenced by performing 22 on Box & Blocks test    Baseline 18    Time 4    Period Weeks    Status New      OT SHORT TERM GOAL #4   Title Pt to feed self 25% with Rt dominant hand    Baseline all Lt hand    Time 4    Period Weeks    Status On-going      OT SHORT TERM GOAL #5   Title Pt to write name consistently with Rt hand    Baseline Lt hand    Time 4    Period Weeks    Status On-going   pt practiced name and address in print with built up pen at 100% legibility              OT Long Term Goals - 04/21/21 1217       OT LONG TERM GOAL #1   Title  Pt independent with updated HEP to including weight bearing    Time 8    Period Weeks    Status New      OT LONG TERM GOAL #2   Title Improve coordination Rt hand as evidenced by performing 9 hole peg test in 65 sec or less    Baseline 78.87 sec    Time 8    Period Weeks    Status New      OT LONG TERM GOAL #3   Title Pt to consistently feed self at 50% or greater with RUE    Time 8    Period Weeks    Status New      OT LONG TERM GOAL #4   Title Pt to  groom self 25% or greater with RUE    Time 8    Period Weeks    Status New                   Plan - 04/22/21 1401     Clinical Impression Statement Pt already progressing towards STG's and using Rt hand more in functional tasks    OT Occupational Profile and History Detailed Assessment- Review of Records and additional review of physical, cognitive, psychosocial history related to current functional performance    Occupational performance deficits (Please refer to evaluation for details): ADL's;IADL's;Social Participation    Body Structure / Function / Physical Skills ADL;Decreased knowledge of use of DME;Strength;Dexterity;GMC;Proprioception;UE functional use;Edema;Body mechanics;IADL;Coordination;Mobility;FMC    Cognitive Skills Memory;Safety Awareness;Problem Solve;Attention    Rehab Potential Fair   time since onset, learned non use   Clinical Decision Making Several treatment options, min-mod task modification necessary    Comorbidities Affecting Occupational Performance: May have comorbidities impacting occupational performance    Modification or Assistance to Complete Evaluation  No modification of tasks or assist necessary to complete eval    OT Frequency 2x / week    OT Duration 8 weeks   plus eval (anticipate only 6 weeks needed)   OT Treatment/Interventions Self-care/ADL training;Fluidtherapy;DME and/or AE instruction;Splinting;Therapeutic activities;Therapeutic exercise;Cognitive remediation/compensation;Coping strategies training;Neuromuscular education;Functional Mobility Training;Passive range of motion;Visual/perceptual remediation/compensation;Patient/family education;Manual Therapy    Plan review HEP prn, Rt hand coordination, A/E needs (one handed cutting board, rocker knife, button hook and A/E for shoelaces?)    Consulted and Agree with Plan of Care Patient;Family member/caregiver    Family Member Consulted husband             Patient will benefit from  skilled therapeutic intervention in order to improve the following deficits and impairments:   Body Structure / Function / Physical Skills: ADL, Decreased knowledge of use of DME, Strength, Dexterity, GMC, Proprioception, UE functional use, Edema, Body mechanics, IADL, Coordination, Mobility, FMC Cognitive Skills: Memory, Safety Awareness, Problem Solve, Attention     Visit Diagnosis: Other lack of coordination  Right spastic hemiparesis ([place])    Problem List Patient Active Problem List   Diagnosis Date Noted   Family history of heart disease 06/14/2020   History of CVA (cerebrovascular accident) 06/14/2020   Exudative age-related macular degeneration of right eye with active choroidal neovascularization ([place]) 09/11/2019   Nocturnal hypoxemia 09/11/2019   Non-restorative sleep 06/13/2019   Benign myoclonus of drowsiness 06/13/2019   Excessive daytime sleepiness 06/13/2019   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   Hemiparesis affecting right side as late effect of cerebrovascular accident ([place]) 06/13/2019   Macular degeneration of right eye 06/11/2019   Class 1 obesity due to excess calories with  serious comorbidity and body mass index (BMI) of 31.0 to 31.9 in adult 06/11/2019   Moderate episode of recurrent major depressive disorder ([place]) 06/11/2019   Spastic hemiparesis ([place]) 03/25/2019   Prediabetes 01/01/2019   Acquired hypothyroidism 01/01/2019   Hypertension 01/01/2019   Aortic atherosclerosis ([place]) 11/07/2018   History of embolic stroke without residual deficits 03/29/2018   Seizure disorder ([place]) 01/12/2017   Common migraine 01/12/2017   Aneurysm ([place])    Brain aneurysm 08/21/2013   Right foot drop 04/10/2013   Intracerebral hemorrhage ([place]) 03/19/2013   Convulsions ([place]) 03/19/2013   Headache 03/19/2013   Abnormality of gait 03/19/2013   Edema 02/23/2011   COUGH 06/10/2008   HYPERLIPIDEMIA 04/25/2008   TOBACCO ABUSE 04/25/2008   ASTHMA 04/25/2008    Chronic obstructive pulmonary disease ([place]) 04/25/2008    Carey Bullocks, OTR/L 04/22/2021, 2:15 PM  [facility] [address] [place], [place], [address] Phone: [phone]   Fax:  [phone]  Name: [name] MRN: [id number] Date of Birth: [birth year]

## 2021-04-23 DIAGNOSIS — H353122 Nonexudative age-related macular degeneration, left eye, intermediate dry stage: Secondary | ICD-10-CM | POA: Diagnosis not present

## 2021-04-23 DIAGNOSIS — H52223 Regular astigmatism, bilateral: Secondary | ICD-10-CM | POA: Diagnosis not present

## 2021-04-23 DIAGNOSIS — H353211 Exudative age-related macular degeneration, right eye, with active choroidal neovascularization: Secondary | ICD-10-CM | POA: Diagnosis not present

## 2021-04-23 DIAGNOSIS — H2513 Age-related nuclear cataract, bilateral: Secondary | ICD-10-CM | POA: Diagnosis not present

## 2021-04-23 DIAGNOSIS — H524 Presbyopia: Secondary | ICD-10-CM | POA: Diagnosis not present

## 2021-04-23 DIAGNOSIS — Z79899 Other long term (current) drug therapy: Secondary | ICD-10-CM | POA: Diagnosis not present

## 2021-04-26 ENCOUNTER — Other Ambulatory Visit: Payer: Self-pay

## 2021-04-26 ENCOUNTER — Ambulatory Visit: Payer: Medicare Other | Admitting: Physical Therapy

## 2021-04-26 DIAGNOSIS — R278 Other lack of coordination: Secondary | ICD-10-CM | POA: Diagnosis not present

## 2021-04-26 DIAGNOSIS — R2689 Other abnormalities of gait and mobility: Secondary | ICD-10-CM | POA: Diagnosis not present

## 2021-04-26 DIAGNOSIS — M6281 Muscle weakness (generalized): Secondary | ICD-10-CM | POA: Diagnosis not present

## 2021-04-26 DIAGNOSIS — R2681 Unsteadiness on feet: Secondary | ICD-10-CM | POA: Diagnosis not present

## 2021-04-26 DIAGNOSIS — G8111 Spastic hemiplegia affecting right dominant side: Secondary | ICD-10-CM | POA: Diagnosis not present

## 2021-04-26 DIAGNOSIS — R262 Difficulty in walking, not elsewhere classified: Secondary | ICD-10-CM | POA: Diagnosis not present

## 2021-04-26 NOTE — Therapy (Signed)
[facility] [address] [place], [place], [address] Phone: [phone]   Fax:  [phone]  Physical Therapy Treatment  Patient Details  Name: [name] MRN: [id number] Date of Birth: April 07, 1956 Referring Provider (PT): Frann Rider, NP   Encounter Date: 04/26/2021   PT End of Session - 04/26/21 2152     Visit Number 6    Number of Visits 13    Date for PT Re-Evaluation 05/21/21    Progress Note Due on Visit 10    PT Start Time 1015    PT Stop Time 1100    PT Time Calculation (min) 45 min    Equipment Utilized During Treatment Gait belt    Activity Tolerance Patient tolerated treatment well    Behavior During Therapy WFL for tasks assessed/performed             Past Medical History:  Diagnosis Date   Abnormal gait    Amnesia    Anemia    hx   Aneurysm (HCC)    Anxiety    Asthma    Atrophic vaginitis    Benign hypertensive heart disease    Cardiomegaly    Chest pain    COPD (chronic obstructive pulmonary disease) ([place])     - PFTs  01/24/06 FEV1 69% ratio 68% diffusing capacity 57% with no improvement after B2   - PFT's 10/1/ 09 FEV1 77   ratio  76    dlco                   45            no resp to B2    - Nl alpha one antitripsin level 10/09   Cough    Depression    Difficulty speaking    Disorder of nasal cavity    Dyspnea    Enthesopathy of elbow region    GERD (gastroesophageal reflux disease)    Hammer toe    Headache(784.0)    Hemiparesis affecting dominant side as late effect of cerebrovascular accident ([place])    History of kidney stones    Hyperlipidemia    Hypertension    Hypothyroidism    Increased urinary frequency    Low compliance bladder    Macular degeneration of both eyes    Malaise and fatigue    Nonruptured cerebral aneurysm    Obesity    Pneumonia    hx   Pure hypercholesterolemia    Recurrent major depressive episodes ([place])    Seizure disorder, secondary  ([place])    2-3 focal siezures monthly   Seizures ([place])    Short-term memory loss    since stroke   Skin sensation disturbance    Stroke ([place]) 2011   rt sided weakness   Swelling of limb    TMJ (temporomandibular joint disorder)    Urinary tract infectious disease    Vertigo as late effect of stroke    Vitamin D deficiency     Past Surgical History:  Procedure Laterality Date   BACK SURGERY     CEREBRAL ANGIOGRAM     x3   COLONOSCOPY WITH PROPOFOL N/A 08/18/2016   Procedure: COLONOSCOPY WITH PROPOFOL;  Surgeon: Carol Ada, MD;  Location: [facility];  Service: Endoscopy;  Laterality: N/A;   LAPAROSCOPIC HYSTERECTOMY     MANDIBLE FRACTURE SURGERY     RADIOLOGY WITH ANESTHESIA N/A 08/21/2013   Procedure: RADIOLOGY WITH ANESTHESIA;  Surgeon: Rob Hickman, MD;  Location: [facility];  Service: Radiology;  Laterality: N/A;   stones     no surgery    There were no vitals filed for this visit.   Subjective Assessment - 04/26/21 1012     Subjective Patient reports no new changes or problems: pt reports she would like to have a new brace that didn't come up so high on her leg; primary PT, [place], was consulted and she instructed pt to bring in her Bioness unit next session and plan will be to reprogram this unit to determine effectiveness    Patient is accompained by: Family member   Husband   Pertinent History left frontoparietal parenchymal hemorrhage (2011), mild cognitive impairment, seizures, chronic headaches, cerebral aneurysm s/p stenting 2014, essential tremors, COPD and HTN, macular degeneration, Hypothyroidism, HLD, GERD, Hammer Toe, Depression, Cardiomegaly, Asthma, Anxiety and Anemia    Limitations Standing;Walking    Patient Stated Goals Improve Balance; Try to get toes in good position    Currently in Pain? No/denies    Pain Onset More than a month ago                               [facility] Adult PT Treatment/Exercise - 04/26/21 1029        Transfers   Transfers Sit to Stand;Stand to Sit    Number of Reps Other reps (comment)   3 reps   Comments from mat - no UE support used      Ambulation/Gait   Ambulation/Gait Yes    Ambulation/Gait Assistance 4: Min guard    Ambulation/Gait Assistance Details no cane used - pt wearing ASO on RLE    Ambulation Distance (Feet) 60 Feet    Assistive device None    Gait Pattern Step-through pattern;Decreased arm swing - right;Decreased stance time - right;Decreased weight shift to right;Decreased hip/knee flexion - right    Ambulation Surface Level;Indoor      Exercises   Exercises Knee/Hip      Knee/Hip Exercises: Standing   Heel Raises Both;1 set;5 reps    Heel Raises Limitations pt unable to plantarlfex RLE unilaterally in standing    Forward Step Up Right;1 set;10 reps;Hand Hold: 2    Step Down Right;1 set;10 reps   with   Rocker Board Other (comment)   20 reps with bil. UE support on // bars   Other Standing Knee Exercises tap ups to 1st step 5 reps with LLE ; 10 reps to 2nd step with LLE for improved Rt knee control with RLE SLS - LUE support used on rail      Knee/Hip Exercises: Supine   Bridges AROM;Both;1 set;5 reps    Bridges with Clamshell AROM;Both;1 set;10 reps    Single Leg Bridge AROM;Right;1 set;10 reps    Straight Leg Raises AROM;Right;1 set;10 reps   SLR - made circles clockwise 5 reps, counterclockwise 5 reps   Other Supine Knee/Hip Exercises Bridging with LLE extension 10 reps - with mod assist to prevent RLE from moving;  bridging with marching 10 reps    Other Supine Knee/Hip Exercises Rt hip extension control exercise with 3# weight 10 reps      Knee/Hip Exercises: Sidelying   Hip ABduction Right;Strengthening;1 set;10 reps   2# weight used   Clams 10 reps 3# weight RLE    Other Sidelying Knee/Hip Exercises RLE abducted - moved Rt leg forward/back for hip flexion/extension in abducted position with min assist with fatigue  to keep RLE abducted      Knee/Hip  Exercises: Prone   Hamstring Curl 1 set;10 reps   RLE   Hip Extension AROM;Right;1 set;10 reps   wih Rt knee flexed at 90 degrees - min assist to keep knee flexed           Quadruped position - pt lifted LLE with mod assist for stability and balance - pt performed 3 attempts  At maintaining quadruped with extending LLE          PT Short Term Goals - 04/26/21 2158       PT SHORT TERM GOAL #1   Title Patient will be IND with initial HEP for strength/balance (ALL STGS Due: 04/30/21)    Baseline no HEP established    Time 3    Period Weeks    Status New    Target Date 04/30/21      PT SHORT TERM GOAL #2   Title Patient will report understanding of fall prevention within the home to promote improved safety    Baseline dependent    Time 3    Period Weeks    Status New      PT SHORT TERM GOAL #3   Title Patient will improve 5x sit <> stand to </= 20 seconds to demo improved balance and strength    Baseline 22 seconds with UE support    Time 3    Period Weeks    Status New      PT SHORT TERM GOAL #4   Title Patient will improve Berg Balance to >/= 35/56 to demo improved balance and reduced fall risk    Baseline 30/56    Time 3    Period Weeks    Status New               PT Long Term Goals - 04/26/21 2158       PT LONG TERM GOAL #1   Title Patient will be independent with final HEP for balance/strengthening (ALL LTGs Due: 05/21/21)    Baseline No HEP established    Time 6    Period Weeks    Status New      PT LONG TERM GOAL #2   Title Patient will improve Berg Balance to >/= 40/56 to demo improved balance and reduced fall risk    Baseline 30/56    Time 6    Period Weeks    Status New      PT LONG TERM GOAL #3   Title Patient will improve 5x sit <> stand to </= 15 seconds to demo reduced fall risk    Baseline 22 secs    Time 6    Period Weeks    Status New      PT LONG TERM GOAL #4   Title Patient will improve gait speed to >/= 2.0 ft/sec with  LRAD and potential AFO for improved community mobility    Baseline 1.41 ft/sec    Time 6    Period Weeks    Status New      PT LONG TERM GOAL #5   Title Patient will improve FOTO >/= 55%    Baseline 48%    Time 6    Period Weeks    Status New                   Plan - 04/26/21 2153     Clinical Impression Statement Skilled PT session focused on RLE strengthening and  Rt knee control exercises.  Pt had much difficulty maintaining quadruped position with extending LLE to fascilitate Rt hip stabilization and strengthening in closed chain.  Pt able to maintain quadruped position with mod assist.  Pt unable to perform RLE unilateral plantarflexion due to weakness; pt able to control Rt knee to prevent genu recurvatum with concentration and focus on Rt knee control.  Cont with POC.    Personal Factors and Comorbidities Comorbidity 3+    Comorbidities left frontoparietal parenchymal hemorrhage (2011), mild cognitive impairment, seizures, chronic headaches, cerebral aneurysm s/p stenting 2014, essential tremors, COPD and HTN, macular degeneration, Hypothyroidism, HLD, GERD, Hammer Toe, Depression, Cardiomegaly, Asthma, Anxiety and Anemia    Examination-Activity Limitations Transfers;Locomotion Level;Bend;Dressing;Stand;Stairs;Reach Overhead    Examination-Participation Restrictions Cleaning;Meal Prep;Community Activity    Stability/Clinical Decision Making Stable/Uncomplicated    Rehab Potential Good    PT Frequency 2x / week    PT Duration 6 weeks    PT Treatment/Interventions ADLs/Self Care Home Management;Aquatic Therapy;Electrical Stimulation;Moist Heat;Cryotherapy;DME Instruction;Gait training;Stair training;Functional mobility training;Therapeutic activities;Therapeutic exercise;Balance training;Neuromuscular re-education;Patient/family education;Orthotic Fit/Training;Manual techniques;Taping;Vestibular;Passive range of motion;Dry needling    PT Next Visit Plan Pt to bring in Bioness  unit to next session -- Continue with hip abductor, extensor and quad and hamstring strengthening. Step-ups, gait with cane on varied surfaces, balance training. -- Posterior ottobock with lateral t strap worked best on AFO trials but continue to work with pt in her ASO to see if we can improve strength enough that AFO may not be needed. She did bring her Bioness from home but had not worn in 2 years and was not charged. Has heel switch as well. May try in future but again feel strengthening first may be better option as weakness in knee and hip affecting her the most.    Consulted and Agree with Plan of Care Patient             Patient will benefit from skilled therapeutic intervention in order to improve the following deficits and impairments:  Abnormal gait, Decreased coordination, Decreased range of motion, Difficulty walking, Pain, Postural dysfunction, Decreased strength, Decreased balance, Decreased knowledge of use of DME, Impaired tone, Decreased safety awareness, Decreased endurance, Decreased activity tolerance  Visit Diagnosis: Muscle weakness (generalized)  Other abnormalities of gait and mobility     Problem List Patient Active Problem List   Diagnosis Date Noted   Family history of heart disease 06/14/2020   History of CVA (cerebrovascular accident) 06/14/2020   Exudative age-related macular degeneration of right eye with active choroidal neovascularization ([place]) 09/11/2019   Nocturnal hypoxemia 09/11/2019   Non-restorative sleep 06/13/2019   Benign myoclonus of drowsiness 06/13/2019   Excessive daytime sleepiness 06/13/2019   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   Hemiparesis affecting right side as late effect of cerebrovascular accident ([place]) 06/13/2019   Macular degeneration of right eye 06/11/2019   Class 1 obesity due to excess calories with serious comorbidity and body mass index (BMI) of 31.0 to 31.9 in adult 06/11/2019   Moderate episode of  recurrent major depressive disorder ([place]) 06/11/2019   Spastic hemiparesis ([place]) 03/25/2019   Prediabetes 01/01/2019   Acquired hypothyroidism 01/01/2019   Hypertension 01/01/2019   Aortic atherosclerosis ([place]) 11/07/2018   History of embolic stroke without residual deficits 03/29/2018   Seizure disorder ([place]) 01/12/2017   Common migraine 01/12/2017   Aneurysm ([place])    Brain aneurysm 08/21/2013   Right foot drop 04/10/2013   Intracerebral hemorrhage ([place]) 03/19/2013   Convulsions ([place]) 03/19/2013  Headache 03/19/2013   Abnormality of gait 03/19/2013   Edema 02/23/2011   COUGH 06/10/2008   HYPERLIPIDEMIA 04/25/2008   TOBACCO ABUSE 04/25/2008   ASTHMA 04/25/2008   Chronic obstructive pulmonary disease ([place]) 04/25/2008    Maciel Kegg, [place], PT 04/26/2021, 9:59 PM  [facility] [address] [place], [place], [address] Phone: [phone]   Fax:  [phone]  Name: [name] MRN: [id number] Date of Birth: Sep 29, 1955

## 2021-04-27 ENCOUNTER — Ambulatory Visit: Payer: Medicare Other | Admitting: Occupational Therapy

## 2021-04-27 DIAGNOSIS — R278 Other lack of coordination: Secondary | ICD-10-CM

## 2021-04-27 DIAGNOSIS — M6281 Muscle weakness (generalized): Secondary | ICD-10-CM | POA: Diagnosis not present

## 2021-04-27 DIAGNOSIS — G8111 Spastic hemiplegia affecting right dominant side: Secondary | ICD-10-CM | POA: Diagnosis not present

## 2021-04-27 DIAGNOSIS — R262 Difficulty in walking, not elsewhere classified: Secondary | ICD-10-CM | POA: Diagnosis not present

## 2021-04-27 DIAGNOSIS — R2681 Unsteadiness on feet: Secondary | ICD-10-CM | POA: Diagnosis not present

## 2021-04-27 DIAGNOSIS — R2689 Other abnormalities of gait and mobility: Secondary | ICD-10-CM | POA: Diagnosis not present

## 2021-04-27 NOTE — Therapy (Signed)
[facility] [address] [place], [place], [address] Phone: [phone]   Fax:  [phone]  Occupational Therapy Treatment  Patient Details  Name: BLONDIE CLUNE MRN: [id number] Date of Birth: [birth year] No data recorded  Encounter Date: 04/27/2021   OT End of Session - 04/27/21 1053     Visit Number 3    Number of Visits 17    Date for OT Re-Evaluation 06/21/21    Authorization Type MCR    Progress Note Due on Visit 10    OT Start Time 1020    OT Stop Time 1100    OT Time Calculation (min) 40 min    Activity Tolerance Patient tolerated treatment well    Behavior During Therapy [facility] for tasks assessed/performed             Past Medical History:  Diagnosis Date   Abnormal gait    Amnesia    Anemia    hx   Aneurysm ([place])    Anxiety    Asthma    Atrophic vaginitis    Benign hypertensive heart disease    Cardiomegaly    Chest pain    COPD (chronic obstructive pulmonary disease) ([place])     - PFTs  01/24/06 FEV1 69% ratio 68% diffusing capacity 57% with no improvement after B2   - PFT's 10/1/ 09 FEV1 77   ratio  76    dlco                   45            no resp to B2    - Nl alpha one antitripsin level 10/09   Cough    Depression    Difficulty speaking    Disorder of nasal cavity    Dyspnea    Enthesopathy of elbow region    GERD (gastroesophageal reflux disease)    Hammer toe    Headache(784.0)    Hemiparesis affecting dominant side as late effect of cerebrovascular accident ([place])    History of kidney stones    Hyperlipidemia    Hypertension    Hypothyroidism    Increased urinary frequency    Low compliance bladder    Macular degeneration of both eyes    Malaise and fatigue    Nonruptured cerebral aneurysm    Obesity    Pneumonia    hx   Pure hypercholesterolemia    Recurrent major depressive episodes ([place])    Seizure disorder, secondary ([place])    2-3 focal siezures monthly   Seizures  ([place])    Short-term memory loss    since stroke   Skin sensation disturbance    Stroke ([place]) 2011   rt sided weakness   Swelling of limb    TMJ (temporomandibular joint disorder)    Urinary tract infectious disease    Vertigo as late effect of stroke    Vitamin D deficiency     Past Surgical History:  Procedure Laterality Date   BACK SURGERY     CEREBRAL ANGIOGRAM     x3   COLONOSCOPY WITH PROPOFOL N/A 08/18/2016   Procedure: COLONOSCOPY WITH PROPOFOL;  Surgeon: Carol Ada, MD;  Location: [facility];  Service: Endoscopy;  Laterality: N/A;   LAPAROSCOPIC HYSTERECTOMY     MANDIBLE FRACTURE SURGERY     RADIOLOGY WITH ANESTHESIA N/A 08/21/2013   Procedure: RADIOLOGY WITH ANESTHESIA;  Surgeon: Rob Hickman, MD;  Location: [facility];  Service: Radiology;  Laterality: N/A;   stones     no surgery    There were no vitals filed for this visit.   Subjective Assessment - 04/27/21 1020     Subjective  The eating is going better than I thought with my Rt hand    Patient is accompanied by: Family member   husband   Pertinent History Rt spastic hemiparesis from Lt frontoparietal hemorrhage 11/27/2009, cervical spinal stenosis, seizures, chronic headaches, essential tremors, COPD, HTN    Limitations h/o seizures, fall risk    Patient Stated Goals make things easier w/ Rt hand    Currently in Pain? Yes    Pain Score 3     Pain Location Hand    Pain Orientation Right    Pain Descriptors / Indicators Numbness;Sore    Pain Type Chronic pain    Pain Onset More than a month ago    Pain Frequency Constant    Aggravating Factors  nothing    Pain Relieving Factors stretching it              Discussed and demo use of potential A/E needs to increase safety, ease, and independence with ADLS including: rocker knife for cutting cooked meat, one handed cutting board, shoe buttons and button hook. Pt also practiced using rocker knife and button hook. Pt provided handouts and told  how/where to purchase  Pt placing medium sized pegs in pegboard Rt hand w/ cues to prevent shoulder and elbow compensations, copying peg design at 100% accuracy - unable to fully complete design d/t time constraints.                       OT Short Term Goals - 04/27/21 1053       OT SHORT TERM GOAL #1   Title Independent with coordination HEP and functional HEP for Rt hand/RUE    Time 4    Period Weeks    Status On-going      OT SHORT TERM GOAL #2   Title Pt to verbalize understanding of safety considerations due to lack of sensation RUE    Time 4    Period Weeks    Status On-going      OT SHORT TERM GOAL #3   Title Pt to improve RUE function as evidenced by performing 22 on [employer] test    Baseline 18    Time 4    Period Weeks    Status New      OT SHORT TERM GOAL #4   Title Pt to feed self 25% with Rt dominant hand    Baseline all Lt hand    Time 4    Period Weeks    Status On-going      OT SHORT TERM GOAL #5   Title Pt to write name consistently with Rt hand    Baseline Lt hand    Time 4    Period Weeks    Status On-going   pt practiced name and address in print with built up pen at 100% legibility              OT Long Term Goals - 04/21/21 1217       OT LONG TERM GOAL #1   Title Pt independent with updated HEP to including weight bearing    Time 8    Period Weeks    Status New      OT LONG TERM GOAL #2   Title Improve  coordination Rt hand as evidenced by performing 9 hole peg test in 65 sec or less    Baseline 78.87 sec    Time 8    Period Weeks    Status New      OT LONG TERM GOAL #3   Title Pt to consistently feed self at 50% or greater with RUE    Time 8    Period Weeks    Status New      OT LONG TERM GOAL #4   Title Pt to groom self 25% or greater with RUE    Time 8    Period Weeks    Status New                   Plan - 04/27/21 1054     Clinical Impression Statement Pt already progressing towards  STG's and using Rt hand more in functional tasks    OT Occupational Profile and History Detailed Assessment- Review of Records and additional review of physical, cognitive, psychosocial history related to current functional performance    Occupational performance deficits (Please refer to evaluation for details): ADL's;IADL's;Social Participation    Body Structure / Function / Physical Skills ADL;Decreased knowledge of use of DME;Strength;Dexterity;GMC;Proprioception;UE functional use;Edema;Body mechanics;IADL;Coordination;Mobility;FMC    Cognitive Skills Memory;Safety Awareness;Problem Solve;Attention    Rehab Potential Fair   time since onset, learned non use   Clinical Decision Making Several treatment options, min-mod task modification necessary    Comorbidities Affecting Occupational Performance: May have comorbidities impacting occupational performance    Modification or Assistance to Complete Evaluation  No modification of tasks or assist necessary to complete eval    OT Frequency 2x / week    OT Duration 8 weeks   plus eval (anticipate only 6 weeks needed)   OT Treatment/Interventions Self-care/ADL training;Fluidtherapy;DME and/or AE instruction;Splinting;Therapeutic activities;Therapeutic exercise;Cognitive remediation/compensation;Coping strategies training;Neuromuscular education;Functional Mobility Training;Passive range of motion;Visual/perceptual remediation/compensation;Patient/family education;Manual Therapy    Plan continue coordination, writing, and progress towards goals    Consulted and Agree with Plan of Care Patient;Family member/caregiver    Family Member Consulted husband             Patient will benefit from skilled therapeutic intervention in order to improve the following deficits and impairments:   Body Structure / Function / Physical Skills: ADL, Decreased knowledge of use of DME, Strength, Dexterity, GMC, Proprioception, UE functional use, Edema, Body mechanics,  IADL, Coordination, Mobility, FMC Cognitive Skills: Memory, Safety Awareness, Problem Solve, Attention     Visit Diagnosis: Other lack of coordination  Right spastic hemiparesis ([place])    Problem List Patient Active Problem List   Diagnosis Date Noted   Family history of heart disease 06/14/2020   History of CVA (cerebrovascular accident) 06/14/2020   Exudative age-related macular degeneration of right eye with active choroidal neovascularization ([place]) 09/11/2019   Nocturnal hypoxemia 09/11/2019   Non-restorative sleep 06/13/2019   Benign myoclonus of drowsiness 06/13/2019   Excessive daytime sleepiness 06/13/2019   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   Hemiparesis affecting right side as late effect of cerebrovascular accident ([place]) 06/13/2019   Macular degeneration of right eye 06/11/2019   Class 1 obesity due to excess calories with serious comorbidity and body mass index (BMI) of 31.0 to 31.9 in adult 06/11/2019   Moderate episode of recurrent major depressive disorder ([place]) 06/11/2019   Spastic hemiparesis ([place]) 03/25/2019   Prediabetes 01/01/2019   Acquired hypothyroidism 01/01/2019   Hypertension 01/01/2019   Aortic atherosclerosis ([place]) 11/07/2018  History of embolic stroke without residual deficits 03/29/2018   Seizure disorder ([place]) 01/12/2017   Common migraine 01/12/2017   Aneurysm ([place])    Brain aneurysm 08/21/2013   Right foot drop 04/10/2013   Intracerebral hemorrhage ([place]) 03/19/2013   Convulsions ([place]) 03/19/2013   Headache 03/19/2013   Abnormality of gait 03/19/2013   Edema 02/23/2011   COUGH 06/10/2008   HYPERLIPIDEMIA 04/25/2008   TOBACCO ABUSE 04/25/2008   ASTHMA 04/25/2008   Chronic obstructive pulmonary disease ([place]) 04/25/2008    Carey Bullocks, OTR/L 04/27/2021, 11:08 AM  [facility] [address] [place], [place], [address] Phone: [phone]   Fax:   [phone]  Name: NERA GOLIK MRN: [id number] Date of Birth: April 03, 1956

## 2021-04-28 ENCOUNTER — Ambulatory Visit: Payer: Medicare Other

## 2021-04-28 ENCOUNTER — Other Ambulatory Visit: Payer: Self-pay

## 2021-04-28 DIAGNOSIS — R262 Difficulty in walking, not elsewhere classified: Secondary | ICD-10-CM | POA: Diagnosis not present

## 2021-04-28 DIAGNOSIS — M4802 Spinal stenosis, cervical region: Secondary | ICD-10-CM | POA: Diagnosis not present

## 2021-04-28 DIAGNOSIS — R278 Other lack of coordination: Secondary | ICD-10-CM | POA: Diagnosis not present

## 2021-04-28 DIAGNOSIS — R2689 Other abnormalities of gait and mobility: Secondary | ICD-10-CM

## 2021-04-28 DIAGNOSIS — G8111 Spastic hemiplegia affecting right dominant side: Secondary | ICD-10-CM | POA: Diagnosis not present

## 2021-04-28 DIAGNOSIS — M542 Cervicalgia: Secondary | ICD-10-CM | POA: Diagnosis not present

## 2021-04-28 DIAGNOSIS — R2681 Unsteadiness on feet: Secondary | ICD-10-CM

## 2021-04-28 DIAGNOSIS — M5030 Other cervical disc degeneration, unspecified cervical region: Secondary | ICD-10-CM | POA: Diagnosis not present

## 2021-04-28 DIAGNOSIS — M6281 Muscle weakness (generalized): Secondary | ICD-10-CM | POA: Diagnosis not present

## 2021-04-28 DIAGNOSIS — Z6829 Body mass index (BMI) 29.0-29.9, adult: Secondary | ICD-10-CM | POA: Diagnosis not present

## 2021-04-28 DIAGNOSIS — M47812 Spondylosis without myelopathy or radiculopathy, cervical region: Secondary | ICD-10-CM | POA: Diagnosis not present

## 2021-04-28 DIAGNOSIS — M5412 Radiculopathy, cervical region: Secondary | ICD-10-CM | POA: Diagnosis not present

## 2021-04-28 NOTE — Therapy (Signed)
[facility] [address] [place], [place], [address] Phone: [phone]   Fax:  [phone]  Physical Therapy Treatment  Patient Details  Name: [name] MRN: [id number] Date of Birth: [birth year] Referring Provider (PT): Frann Rider, NP   Encounter Date: 04/28/2021   PT End of Session - 04/28/21 1014     Visit Number 7    Number of Visits 13    Date for PT Re-Evaluation 05/21/21    Progress Note Due on Visit 10    PT Start Time 1013    PT Stop Time 1057    PT Time Calculation (min) 44 min    Equipment Utilized During Treatment Gait belt    Activity Tolerance Patient tolerated treatment well    Behavior During Therapy WFL for tasks assessed/performed             Past Medical History:  Diagnosis Date   Abnormal gait    Amnesia    Anemia    hx   Aneurysm (HCC)    Anxiety    Asthma    Atrophic vaginitis    Benign hypertensive heart disease    Cardiomegaly    Chest pain    COPD (chronic obstructive pulmonary disease) ([place])     - PFTs  01/24/06 FEV1 69% ratio 68% diffusing capacity 57% with no improvement after B2   - PFT's 10/1/ 09 FEV1 77   ratio  76    dlco                   45            no resp to B2    - Nl alpha one antitripsin level 10/09   Cough    Depression    Difficulty speaking    Disorder of nasal cavity    Dyspnea    Enthesopathy of elbow region    GERD (gastroesophageal reflux disease)    Hammer toe    Headache(784.0)    Hemiparesis affecting dominant side as late effect of cerebrovascular accident ([place])    History of kidney stones    Hyperlipidemia    Hypertension    Hypothyroidism    Increased urinary frequency    Low compliance bladder    Macular degeneration of both eyes    Malaise and fatigue    Nonruptured cerebral aneurysm    Obesity    Pneumonia    hx   Pure hypercholesterolemia    Recurrent major depressive episodes ([place])    Seizure disorder, secondary  ([place])    2-3 focal siezures monthly   Seizures ([place])    Short-term memory loss    since stroke   Skin sensation disturbance    Stroke ([place]) 2011   rt sided weakness   Swelling of limb    TMJ (temporomandibular joint disorder)    Urinary tract infectious disease    Vertigo as late effect of stroke    Vitamin D deficiency     Past Surgical History:  Procedure Laterality Date   BACK SURGERY     CEREBRAL ANGIOGRAM     x3   COLONOSCOPY WITH PROPOFOL N/A 08/18/2016   Procedure: COLONOSCOPY WITH PROPOFOL;  Surgeon: Carol Ada, MD;  Location: [facility];  Service: Endoscopy;  Laterality: N/A;   LAPAROSCOPIC HYSTERECTOMY     MANDIBLE FRACTURE SURGERY     RADIOLOGY WITH ANESTHESIA N/A 08/21/2013   Procedure: RADIOLOGY WITH ANESTHESIA;  Surgeon: Rob Hickman, MD;  Location: [facility];  Service: Radiology;  Laterality: N/A;   stones     no surgery    There were no vitals filed for this visit.   Subjective Assessment - 04/28/21 1014     Subjective Pt denies any changes.    Patient is accompained by: Family member   Husband   Pertinent History left frontoparietal parenchymal hemorrhage (2011), mild cognitive impairment, seizures, chronic headaches, cerebral aneurysm s/p stenting 2014, essential tremors, COPD and HTN, macular degeneration, Hypothyroidism, HLD, GERD, Hammer Toe, Depression, Cardiomegaly, Asthma, Anxiety and Anemia    Limitations Standing;Walking    Patient Stated Goals Improve Balance; Try to get toes in good position    Currently in Pain? Yes    Pain Score 3     Pain Location Hand    Pain Orientation Right    Pain Descriptors / Indicators Numbness    Pain Type Chronic pain    Pain Onset More than a month ago    Pain Frequency Constant    Pain Onset More than a month ago                               [facility] Adult PT Treatment/Exercise - 04/28/21 1014       Transfers   Transfers Sit to Stand;Stand to Sit    Sit to Stand 5: Supervision     Sit to Stand Details Verbal cues for technique    Sit to Stand Details (indicate cue type and reason) Verbal cues to be sure to lean forward    Five time sit to stand comments  19 sec without hands from chair. Did have poor control of descent on 2 as did not lean forward enough.    Stand to Sit 5: Supervision      Ambulation/Gait   Ambulation/Gait Yes    Ambulation/Gait Assistance 5: Supervision;4: Min guard    Ambulation/Gait Assistance Details Verbal cues to try to increase right stance/weight shift. Pt still leans to left sometimes excessively.    Ambulation Distance (Feet) 115 Feet    Assistive device Straight cane   with quad tip, right ASO   Gait Pattern Step-through pattern;Decreased stance time - right;Decreased arm swing - right;Decreased weight shift to right    Ambulation Surface Level;Indoor      Standardized Balance Assessment   Standardized Balance Assessment Berg Balance Test      Berg Balance Test   Sit to Stand Able to stand without using hands and stabilize independently    Standing Unsupported Able to stand safely 2 minutes    Sitting with Back Unsupported but Feet Supported on Floor or Stool Able to sit safely and securely 2 minutes    Stand to Sit Sits safely with minimal use of hands    Transfers Able to transfer safely, minor use of hands    Standing Unsupported with Eyes Closed Able to stand 10 seconds with supervision    Standing Ubsupported with Feet Together Able to place feet together independently and stand for 1 minute with supervision    From Standing, Reach Forward with Outstretched Arm Can reach forward >12 cm safely (5")    From Standing Position, Pick up Object from Floor Able to pick up shoe, needs supervision    From Standing Position, Turn to Look Behind Over each Shoulder Looks behind one side only/other side shows less weight shift    Turn 360 Degrees Needs close supervision or verbal  cueing    Standing Unsupported, Alternately Place Feet on  Step/Stool Able to complete >2 steps/needs minimal assist    Standing Unsupported, One Foot in Front Able to take small step independently and hold 30 seconds    Standing on One Leg Unable to try or needs assist to prevent fall    Total Score 39      Neuro Re-ed    Neuro Re-ed Details  Quadruped on mat: weight shifting side to side x 5, alternating leg lifts x 5 each leg with verbal cue to tighten tummy and tactile assist at pelvis. Less stable with right weight bearing. Tall kneeling trying to hold without UE support x 30 sec CGA/min assist losing balance anterior at times. Tall kneeling with green physioball support in front: alternating leg lifts x 10, hip hinge x 10 with tactile cues to facilitate weight shift to right. Weaving in and out of 4 cones with cane support CGA x 4, tapping each cone with foot then stepping over x 1 CGA/min assist. Pt was cued to tighten gluts to help provide support when shifting over leg.                    PT Education - 04/28/21 1157     Education Details Discussed results of goal check    Person(s) Educated Patient;Spouse    Methods Explanation    Comprehension Verbalized understanding              PT Short Term Goals - 04/28/21 1017       PT SHORT TERM GOAL #1   Title Patient will be IND with initial HEP for strength/balance (ALL STGS Due: 04/30/21)    Baseline 04/28/21 Pt reports compliance with current HEP. Denies any questions.    Time 3    Period Weeks    Status Achieved    Target Date 04/30/21      PT SHORT TERM GOAL #2   Title Patient will report understanding of fall prevention within the home to promote improved safety    Baseline Pt able to verbalize fall prevention strategies: turning on lights, removing rugs, keeping floors clear.    Time 3    Period Weeks    Status Achieved      PT SHORT TERM GOAL #3   Title Patient will improve 5x sit <> stand to </= 20 seconds to demo improved balance and strength    Baseline 22  seconds with UE support. 04/28/21 19 sec    Time 3    Period Weeks    Status Achieved      PT SHORT TERM GOAL #4   Title Patient will improve Berg Balance to >/= 35/56 to demo improved balance and reduced fall risk    Baseline 30/56, 04/28/21 39/56    Time 3    Period Weeks    Status Achieved               PT Long Term Goals - 04/26/21 2158       PT LONG TERM GOAL #1   Title Patient will be independent with final HEP for balance/strengthening (ALL LTGs Due: 05/21/21)    Baseline No HEP established    Time 6    Period Weeks    Status New      PT LONG TERM GOAL #2   Title Patient will improve Berg Balance to >/= 40/56 to demo improved balance and reduced fall risk    Baseline 30/56  Time 6    Period Weeks    Status New      PT LONG TERM GOAL #3   Title Patient will improve 5x sit <> stand to </= 15 seconds to demo reduced fall risk    Baseline 22 secs    Time 6    Period Weeks    Status New      PT LONG TERM GOAL #4   Title Patient will improve gait speed to >/= 2.0 ft/sec with LRAD and potential AFO for improved community mobility    Baseline 1.41 ft/sec    Time 6    Period Weeks    Status New      PT LONG TERM GOAL #5   Title Patient will improve FOTO >/= 55%    Baseline 48%    Time 6    Period Weeks    Status New                   Plan - 04/28/21 1158     Clinical Impression Statement STG check today. Pt has shown progress on all goals meeting all STGs. She decreased her 5 x sit to stand without using UE support today. Does tend to lean posterior too much at times. Berg score improved to 39/56 meeting goal but still high fall risk. PT continued to work on trying to increase RLE strengthening and weight shift with activities. Pt continues to benefit from skilled PT to continue to address balance, strength and functional mobility deficits.    Personal Factors and Comorbidities Comorbidity 3+    Comorbidities left frontoparietal parenchymal  hemorrhage (2011), mild cognitive impairment, seizures, chronic headaches, cerebral aneurysm s/p stenting 2014, essential tremors, COPD and HTN, macular degeneration, Hypothyroidism, HLD, GERD, Hammer Toe, Depression, Cardiomegaly, Asthma, Anxiety and Anemia    Examination-Activity Limitations Transfers;Locomotion Level;Bend;Dressing;Stand;Stairs;Reach Overhead    Examination-Participation Restrictions Cleaning;Meal Prep;Community Activity    Stability/Clinical Decision Making Stable/Uncomplicated    Rehab Potential Good    PT Frequency 2x / week    PT Duration 6 weeks    PT Treatment/Interventions ADLs/Self Care Home Management;Aquatic Therapy;Electrical Stimulation;Moist Heat;Cryotherapy;DME Instruction;Gait training;Stair training;Functional mobility training;Therapeutic activities;Therapeutic exercise;Balance training;Neuromuscular re-education;Patient/family education;Orthotic Fit/Training;Manual techniques;Taping;Vestibular;Passive range of motion;Dry needling    PT Next Visit Plan Continue with hip abductor, extensor and quad and hamstring strengthening. Step-ups, gait with cane on varied surfaces, balance training. -- Posterior ottobock with lateral t strap worked best on AFO trials but continue to work with pt in her ASO to see if we can improve strength enough that AFO may not be needed. She did bring her Bioness from home but had not worn in 2 years and was not charged. Has heel switch as well. May try in future but again feel strengthening first may be better option as weakness in knee and hip affecting her the most.    Consulted and Agree with Plan of Care Patient             Patient will benefit from skilled therapeutic intervention in order to improve the following deficits and impairments:  Abnormal gait, Decreased coordination, Decreased range of motion, Difficulty walking, Pain, Postural dysfunction, Decreased strength, Decreased balance, Decreased knowledge of use of DME, Impaired  tone, Decreased safety awareness, Decreased endurance, Decreased activity tolerance  Visit Diagnosis: Other abnormalities of gait and mobility  Unsteadiness on feet  Muscle weakness (generalized)     Problem List Patient Active Problem List   Diagnosis Date Noted   Family history of heart  disease 06/14/2020   History of CVA (cerebrovascular accident) 06/14/2020   Exudative age-related macular degeneration of right eye with active choroidal neovascularization ([place]) 09/11/2019   Nocturnal hypoxemia 09/11/2019   Non-restorative sleep 06/13/2019   Benign myoclonus of drowsiness 06/13/2019   Excessive daytime sleepiness 06/13/2019   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   Hemiparesis affecting right side as late effect of cerebrovascular accident ([place]) 06/13/2019   Macular degeneration of right eye 06/11/2019   Class 1 obesity due to excess calories with serious comorbidity and body mass index (BMI) of 31.0 to 31.9 in adult 06/11/2019   Moderate episode of recurrent major depressive disorder ([place]) 06/11/2019   Spastic hemiparesis ([place]) 03/25/2019   Prediabetes 01/01/2019   Acquired hypothyroidism 01/01/2019   Hypertension 01/01/2019   Aortic atherosclerosis ([place]) 11/07/2018   History of embolic stroke without residual deficits 03/29/2018   Seizure disorder ([place]) 01/12/2017   Common migraine 01/12/2017   Aneurysm ([place])    Brain aneurysm 08/21/2013   Right foot drop 04/10/2013   Intracerebral hemorrhage ([place]) 03/19/2013   Convulsions ([place]) 03/19/2013   Headache 03/19/2013   Abnormality of gait 03/19/2013   Edema 02/23/2011   COUGH 06/10/2008   HYPERLIPIDEMIA 04/25/2008   TOBACCO ABUSE 04/25/2008   ASTHMA 04/25/2008   Chronic obstructive pulmonary disease ([place]) 04/25/2008    Electa Sniff, PT, DPT, NCS 04/28/2021, 12:01 PM  [facility] [address] [place], [place], [address] Phone: [phone]    Fax:  [phone]  Name: TYYANNA GASKINS MRN: [id number] Date of Birth: [birth year]

## 2021-05-02 ENCOUNTER — Other Ambulatory Visit: Payer: Self-pay | Admitting: Internal Medicine

## 2021-05-03 ENCOUNTER — Other Ambulatory Visit: Payer: Self-pay

## 2021-05-03 ENCOUNTER — Ambulatory Visit: Payer: Medicare Other

## 2021-05-03 DIAGNOSIS — R262 Difficulty in walking, not elsewhere classified: Secondary | ICD-10-CM

## 2021-05-03 DIAGNOSIS — R2681 Unsteadiness on feet: Secondary | ICD-10-CM | POA: Diagnosis not present

## 2021-05-03 DIAGNOSIS — R278 Other lack of coordination: Secondary | ICD-10-CM | POA: Diagnosis not present

## 2021-05-03 DIAGNOSIS — M6281 Muscle weakness (generalized): Secondary | ICD-10-CM

## 2021-05-03 DIAGNOSIS — R2689 Other abnormalities of gait and mobility: Secondary | ICD-10-CM

## 2021-05-03 DIAGNOSIS — G8111 Spastic hemiplegia affecting right dominant side: Secondary | ICD-10-CM | POA: Diagnosis not present

## 2021-05-03 NOTE — Therapy (Signed)
[facility] [address] [place], [place], [address] Phone: [phone]   Fax:  [phone]  Physical Therapy Treatment  Patient Details  Name: [name] MRN: [id number] Date of Birth: 08/31/1956 Referring Provider (PT): Frann Rider, NP   Encounter Date: 05/03/2021   PT End of Session - 05/03/21 0935     Visit Number 8    Number of Visits 13    Date for PT Re-Evaluation 05/21/21    Progress Note Due on Visit 10    PT Start Time 0931    PT Stop Time 1014    PT Time Calculation (min) 43 min    Equipment Utilized During Treatment Gait belt    Activity Tolerance Patient tolerated treatment well    Behavior During Therapy WFL for tasks assessed/performed             Past Medical History:  Diagnosis Date   Abnormal gait    Amnesia    Anemia    hx   Aneurysm (HCC)    Anxiety    Asthma    Atrophic vaginitis    Benign hypertensive heart disease    Cardiomegaly    Chest pain    COPD (chronic obstructive pulmonary disease) ([employer])     - PFTs  01/24/06 FEV1 69% ratio 68% diffusing capacity 57% with no improvement after B2   - PFT's 10/1/ 09 FEV1 77   ratio  76    dlco                   45            no resp to B2    - Nl alpha one antitripsin level 10/09   Cough    Depression    Difficulty speaking    Disorder of nasal cavity    Dyspnea    Enthesopathy of elbow region    GERD (gastroesophageal reflux disease)    Hammer toe    Headache(784.0)    Hemiparesis affecting dominant side as late effect of cerebrovascular accident ([place])    History of kidney stones    Hyperlipidemia    Hypertension    Hypothyroidism    Increased urinary frequency    Low compliance bladder    Macular degeneration of both eyes    Malaise and fatigue    Nonruptured cerebral aneurysm    Obesity    Pneumonia    hx   Pure hypercholesterolemia    Recurrent major depressive episodes ([place])    Seizure disorder, secondary  ([place])    2-3 focal siezures monthly   Seizures ([place])    Short-term memory loss    since stroke   Skin sensation disturbance    Stroke ([place]) 2011   rt sided weakness   Swelling of limb    TMJ (temporomandibular joint disorder)    Urinary tract infectious disease    Vertigo as late effect of stroke    Vitamin D deficiency     Past Surgical History:  Procedure Laterality Date   BACK SURGERY     CEREBRAL ANGIOGRAM     x3   COLONOSCOPY WITH PROPOFOL N/A 08/18/2016   Procedure: COLONOSCOPY WITH PROPOFOL;  Surgeon: Carol Ada, MD;  Location: [facility];  Service: Endoscopy;  Laterality: N/A;   LAPAROSCOPIC HYSTERECTOMY     MANDIBLE FRACTURE SURGERY     RADIOLOGY WITH ANESTHESIA N/A 08/21/2013   Procedure: RADIOLOGY WITH ANESTHESIA;  Surgeon: Rob Hickman, MD;  Location: [facility];  Service: Radiology;  Laterality: N/A;   stones     no surgery    There were no vitals filed for this visit.   Subjective Assessment - 05/03/21 0935     Subjective No new changes/complaints. No pain.    Patient is accompained by: Family member   Husband   Pertinent History left frontoparietal parenchymal hemorrhage (2011), mild cognitive impairment, seizures, chronic headaches, cerebral aneurysm s/p stenting 2014, essential tremors, COPD and HTN, macular degeneration, Hypothyroidism, HLD, GERD, Hammer Toe, Depression, Cardiomegaly, Asthma, Anxiety and Anemia    Limitations Standing;Walking    Patient Stated Goals Improve Balance; Try to get toes in good position    Currently in Pain? No/denies    Pain Onset More than a month ago    Pain Onset More than a month ago                 [facility] Adult PT Treatment/Exercise - 05/03/21 0001       Transfers   Transfers Sit to Stand;Stand to Sit    Sit to Stand 5: Supervision    Sit to Stand Details Verbal cues for technique    Stand to Sit 5: Supervision    Number of Reps 10 reps;2 sets    Comments from mat - no UE support used. completed 1  set with feet even, then second set completd with RLE posterior for improved strengthening.      Ambulation/Gait   Ambulation/Gait Yes    Ambulation/Gait Assistance 5: Supervision    Ambulation/Gait Assistance Details throughout therapy session between activities    Ambulation Distance (Feet) --   clinic distance   Assistive device Straight cane    Gait Pattern Step-through pattern;Decreased stance time - right;Decreased arm swing - right;Decreased weight shift to right    Ambulation Surface Level;Indoor      High Level Balance   High Level Balance Activities Side stepping    High Level Balance Comments With red theraband around thighs, completed x 4 laps down and back with cues for control and step length. Completed static standing march with red resistance band x 10 reps bilatrally, single UE support utilized. Cues for improved hip/knee flexion      Neuro Re-ed    Neuro Re-ed Details  Tall Kneeling on mat with BUE support from bench: completed hip hinge working on equal weight shift x 10 reps, then completed resisted hip hinge with red theraband x 10 reps. PT providing faciliation for improved weight shift on RLE. Then completed reaching activity with cones, reaching with LUE across midline onto R Side 2 x 6 reps, then alternating reaching across midline with RUER onto L side 2 x 6 reps. Intermittent CGA.      Exercises   Exercises Knee/Hip      Knee/Hip Exercises: Aerobic   Other Aerobic Completed SciFit for improved strengthening/activity tolerance with BUE/BLE on Level 2.0 x 6 minutes. Utilized BUE support the entire time.               Balance Exercises - 05/03/21 0001       Balance Exercises: Standing   Standing Eyes Closed Wide (BOA);Solid surface;3 reps;30 secs;Limitations    Standing Eyes Closed Limitations intermittent touch to countertop    Other Standing Exercises completed staggered stance with alternating LE forward, completed holding steady 2 x 15 seconds each.                  PT Short Term Goals -  04/28/21 1017       PT SHORT TERM GOAL #1   Title Patient will be IND with initial HEP for strength/balance (ALL STGS Due: 04/30/21)    Baseline 04/28/21 Pt reports compliance with current HEP. Denies any questions.    Time 3    Period Weeks    Status Achieved    Target Date 04/30/21      PT SHORT TERM GOAL #2   Title Patient will report understanding of fall prevention within the home to promote improved safety    Baseline Pt able to verbalize fall prevention strategies: turning on lights, removing rugs, keeping floors clear.    Time 3    Period Weeks    Status Achieved      PT SHORT TERM GOAL #3   Title Patient will improve 5x sit <> stand to </= 20 seconds to demo improved balance and strength    Baseline 22 seconds with UE support. 04/28/21 19 sec    Time 3    Period Weeks    Status Achieved      PT SHORT TERM GOAL #4   Title Patient will improve Berg Balance to >/= 35/56 to demo improved balance and reduced fall risk    Baseline 30/56, 04/28/21 39/56    Time 3    Period Weeks    Status Achieved               PT Long Term Goals - 04/26/21 2158       PT LONG TERM GOAL #1   Title Patient will be independent with final HEP for balance/strengthening (ALL LTGs Due: 05/21/21)    Baseline No HEP established    Time 6    Period Weeks    Status New      PT LONG TERM GOAL #2   Title Patient will improve Berg Balance to >/= 40/56 to demo improved balance and reduced fall risk    Baseline 30/56    Time 6    Period Weeks    Status New      PT LONG TERM GOAL #3   Title Patient will improve 5x sit <> stand to </= 15 seconds to demo reduced fall risk    Baseline 22 secs    Time 6    Period Weeks    Status New      PT LONG TERM GOAL #4   Title Patient will improve gait speed to >/= 2.0 ft/sec with LRAD and potential AFO for improved community mobility    Baseline 1.41 ft/sec    Time 6    Period Weeks    Status New       PT LONG TERM GOAL #5   Title Patient will improve FOTO >/= 55%    Baseline 48%    Time 6    Period Weeks    Status New                   Plan - 05/03/21 1015     Clinical Impression Statement Continued NMR in tall kneeling today with patient tolerating well, incorporating reaching and weight shifting tasks from this position. Intermittent CCA required. Continued strengthening focused on prox hip stability. Will continue to address deficits and progress toward LTGs.    Personal Factors and Comorbidities Comorbidity 3+    Comorbidities left frontoparietal parenchymal hemorrhage (2011), mild cognitive impairment, seizures, chronic headaches, cerebral aneurysm s/p stenting 2014, essential tremors, COPD and HTN, macular degeneration, Hypothyroidism, HLD, GERD,  Hammer Toe, Depression, Cardiomegaly, Asthma, Anxiety and Anemia    Examination-Activity Limitations Transfers;Locomotion Level;Bend;Dressing;Stand;Stairs;Reach Overhead    Examination-Participation Restrictions Cleaning;Meal Prep;Community Activity    Stability/Clinical Decision Making Stable/Uncomplicated    Rehab Potential Good    PT Frequency 2x / week    PT Duration 6 weeks    PT Treatment/Interventions ADLs/Self Care Home Management;Aquatic Therapy;Electrical Stimulation;Moist Heat;Cryotherapy;DME Instruction;Gait training;Stair training;Functional mobility training;Therapeutic activities;Therapeutic exercise;Balance training;Neuromuscular re-education;Patient/family education;Orthotic Fit/Training;Manual techniques;Taping;Vestibular;Passive range of motion;Dry needling    PT Next Visit Plan Continue with hip abductor, extensor and quad and hamstring strengthening. Step-ups, gait with cane on varied surfaces, balance training. -- Posterior ottobock with lateral t strap worked best on AFO trials but continue to work with pt in her ASO to see if we can improve strength enough that AFO may not be needed. She did bring her Bioness  from home but had not worn in 2 years and was not charged. Has heel switch as well. May try in future but again feel strengthening first may be better option as weakness in knee and hip affecting her the most.    Consulted and Agree with Plan of Care Patient             Patient will benefit from skilled therapeutic intervention in order to improve the following deficits and impairments:  Abnormal gait, Decreased coordination, Decreased range of motion, Difficulty walking, Pain, Postural dysfunction, Decreased strength, Decreased balance, Decreased knowledge of use of DME, Impaired tone, Decreased safety awareness, Decreased endurance, Decreased activity tolerance  Visit Diagnosis: Other abnormalities of gait and mobility  Muscle weakness (generalized)  Unsteadiness on feet  Difficulty in walking, not elsewhere classified     Problem List Patient Active Problem List   Diagnosis Date Noted   Family history of heart disease 06/14/2020   History of CVA (cerebrovascular accident) 06/14/2020   Exudative age-related macular degeneration of right eye with active choroidal neovascularization ([place]) 09/11/2019   Nocturnal hypoxemia 09/11/2019   Non-restorative sleep 06/13/2019   Benign myoclonus of drowsiness 06/13/2019   Excessive daytime sleepiness 06/13/2019   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   Hemiparesis affecting right side as late effect of cerebrovascular accident ([place]) 06/13/2019   Macular degeneration of right eye 06/11/2019   Class 1 obesity due to excess calories with serious comorbidity and body mass index (BMI) of 31.0 to 31.9 in adult 06/11/2019   Moderate episode of recurrent major depressive disorder ([place]) 06/11/2019   Spastic hemiparesis ([place]) 03/25/2019   Prediabetes 01/01/2019   Acquired hypothyroidism 01/01/2019   Hypertension 01/01/2019   Aortic atherosclerosis ([place]) 11/07/2018   History of embolic stroke without residual deficits 03/29/2018    Seizure disorder ([place]) 01/12/2017   Common migraine 01/12/2017   Aneurysm ([place])    Brain aneurysm 08/21/2013   Right foot drop 04/10/2013   Intracerebral hemorrhage ([place]) 03/19/2013   Convulsions ([place]) 03/19/2013   Headache 03/19/2013   Abnormality of gait 03/19/2013   Edema 02/23/2011   COUGH 06/10/2008   HYPERLIPIDEMIA 04/25/2008   TOBACCO ABUSE 04/25/2008   ASTHMA 04/25/2008   Chronic obstructive pulmonary disease ([place]) 04/25/2008    Jones Bales, PT, DPT 05/03/2021, 11:00 AM  [facility] [address] [place], [place], [address] Phone: [phone]   Fax:  [phone]  Name: [name] MRN: [id number] Date of Birth: [birth year]

## 2021-05-04 DIAGNOSIS — H353211 Exudative age-related macular degeneration, right eye, with active choroidal neovascularization: Secondary | ICD-10-CM | POA: Diagnosis not present

## 2021-05-04 DIAGNOSIS — H353122 Nonexudative age-related macular degeneration, left eye, intermediate dry stage: Secondary | ICD-10-CM | POA: Diagnosis not present

## 2021-05-05 ENCOUNTER — Encounter (HOSPITAL_BASED_OUTPATIENT_CLINIC_OR_DEPARTMENT_OTHER): Payer: Self-pay

## 2021-05-05 ENCOUNTER — Ambulatory Visit (INDEPENDENT_AMBULATORY_CARE_PROVIDER_SITE_OTHER): Payer: Medicare Other | Admitting: Cardiology

## 2021-05-05 ENCOUNTER — Other Ambulatory Visit: Payer: Self-pay

## 2021-05-05 ENCOUNTER — Encounter (HOSPITAL_BASED_OUTPATIENT_CLINIC_OR_DEPARTMENT_OTHER): Payer: Self-pay | Admitting: Cardiology

## 2021-05-05 VITALS — BP 114/72 | HR 90 | Ht 62.0 in | Wt 160.4 lb

## 2021-05-05 DIAGNOSIS — Z8249 Family history of ischemic heart disease and other diseases of the circulatory system: Secondary | ICD-10-CM | POA: Diagnosis not present

## 2021-05-05 DIAGNOSIS — Z8673 Personal history of transient ischemic attack (TIA), and cerebral infarction without residual deficits: Secondary | ICD-10-CM

## 2021-05-05 DIAGNOSIS — I200 Unstable angina: Secondary | ICD-10-CM

## 2021-05-05 DIAGNOSIS — E785 Hyperlipidemia, unspecified: Secondary | ICD-10-CM | POA: Diagnosis not present

## 2021-05-05 DIAGNOSIS — Z712 Person consulting for explanation of examination or test findings: Secondary | ICD-10-CM | POA: Diagnosis not present

## 2021-05-05 DIAGNOSIS — I700 Atherosclerosis of aorta: Secondary | ICD-10-CM | POA: Diagnosis not present

## 2021-05-05 DIAGNOSIS — I2510 Atherosclerotic heart disease of native coronary artery without angina pectoris: Secondary | ICD-10-CM | POA: Diagnosis not present

## 2021-05-05 NOTE — Progress Notes (Signed)
Cardiology Office Note   Date:  05/05/2021   ID:  [name], DOB [birth year], MRN [id number]  PCP:  Glendale Chard, MD  Cardiologist:  Buford Dresser, MD  Referring MD: Glendale Chard, MD   CC: follow up  History of Present Illness:    [name] is a 65 y.o. female with a hx of brain aneurysm, aortic atherosclerosis, hypertension, hyperlipidemia, former tobacco use, COPD, prior ICH who is seen for follow up. I initially saw her 04/20/20 as a new consult at the request of Glendale Chard, MD for the evaluation and management of dyspnea on exertion.  Cardiovascular risk factors: Prior clinical ASCVD: ICH/CVA Comorbid conditions: endorses hypertension, hyperlipidemia. Denies diabetes, chronic kidney disease Tobacco use history: former, from age 71 until the day of her stroke in 2011. None since. Was 2 ppd at her peak. Family history: father has stents, has had open heart surgery. Has cancer now. Mother has a heart murmur. No heart surgeries that she knows of. Has a brother, has sleep apnea. No known heart issues. Prior cardiac testing and/or incidental findings on other testing (ie coronary calcium): echo 2015. CT noncon lung 04/19/19 showed aortic atherosclerosis and coronary calcification. On noncontrast CT, has prominent calcium in distal left main/proximal LAD.  Today: Overall, she is feeling good aside from continued Left UE tingling and numbness. It was thought her symptoms may be due to nerve compression. She had a CCTA done to exclude obstructive CAD/ischemia, and this was reviewed at length today.   After sitting or standing for a time she develops minor LE edema.  She remains compliant and tolerant of her medications. Current supplements include fish oil.  She denies any palpitations, chest pain, or shortness of breath. No lightheadedness, headaches, syncope, orthopnea, or PND. Also has no exertional symptoms.   Past Medical History:  Diagnosis Date    Abnormal gait    Amnesia    Anemia    hx   Aneurysm (HCC)    Anxiety    Asthma    Atrophic vaginitis    Benign hypertensive heart disease    Cardiomegaly    Chest pain    COPD (chronic obstructive pulmonary disease) ([place])     - PFTs  01/24/06 FEV1 69% ratio 68% diffusing capacity 57% with no improvement after B2   - PFT's 10/1/ 09 FEV1 77   ratio  76    dlco                   45            no resp to B2    - Nl alpha one antitripsin level 10/09   Cough    Depression    Difficulty speaking    Disorder of nasal cavity    Dyspnea    Enthesopathy of elbow region    GERD (gastroesophageal reflux disease)    Hammer toe    Headache(784.0)    Hemiparesis affecting dominant side as late effect of cerebrovascular accident ([place])    History of kidney stones    Hyperlipidemia    Hypertension    Hypothyroidism    Increased urinary frequency    Low compliance bladder    Macular degeneration of both eyes    Malaise and fatigue    Nonruptured cerebral aneurysm    Obesity    Pneumonia    hx   Pure hypercholesterolemia    Recurrent major depressive episodes ([place])    Seizure disorder, secondary ([place])  2-3 focal siezures monthly   Seizures (HCC)    Short-term memory loss    since stroke   Skin sensation disturbance    Stroke [facility]) 2011   rt sided weakness   Swelling of limb    TMJ (temporomandibular joint disorder)    Urinary tract infectious disease    Vertigo as late effect of stroke    Vitamin D deficiency     Past Surgical History:  Procedure Laterality Date   BACK SURGERY     CEREBRAL ANGIOGRAM     x3   COLONOSCOPY WITH PROPOFOL N/A 08/18/2016   Procedure: COLONOSCOPY WITH PROPOFOL;  Surgeon: Carol Ada, MD;  Location: [facility];  Service: Endoscopy;  Laterality: N/A;   LAPAROSCOPIC HYSTERECTOMY     MANDIBLE FRACTURE SURGERY     RADIOLOGY WITH ANESTHESIA N/A 08/21/2013   Procedure: RADIOLOGY WITH ANESTHESIA;  Surgeon: Rob Hickman, MD;  Location: [facility];   Service: Radiology;  Laterality: N/A;   stones     no surgery    Current Medications: Current Outpatient Medications on File Prior to Visit  Medication Sig   albuterol (PROVENTIL) (2.5 MG/3ML) 0.083% nebulizer solution Take 3 mLs (2.5 mg total) by nebulization every 6 (six) hours.   albuterol (VENTOLIN HFA) 108 (90 Base) MCG/ACT inhaler Inhale 2 puffs into the lungs every 6 (six) hours as needed for wheezing or shortness of breath.   ALPRAZolam (XANAX) 0.25 MG tablet TAKE 1 TABLET TWICE A DAY AS NEEDED   aspirin EC 81 MG tablet Take 81 mg by mouth daily.   Biotin 1 MG CAPS Take by mouth.   Cholecalciferol (VITAMIN D-3) 1000 units CAPS Take 1,000 Units by mouth daily.   citalopram (CELEXA) 40 MG tablet TAKE 1 TABLET AT BEDTIME   clopidogrel (PLAVIX) 75 MG tablet TAKE 1 TABLET DAILY   CRANBERRY PO Take 2 tablets by mouth 2 (two) times daily.   cyclobenzaprine (FLEXERIL) 10 MG tablet Take 1 tablet (10 mg total) by mouth 3 (three) times daily as needed.   diclofenac Sodium (VOLTAREN) 1 % GEL Voltaren 1 % topical gel  APPLY 2 GRAM TO THE AFFECTED AREA(S) BY TOPICAL ROUTE 4 TIMES PER DAY   fluticasone (FLONASE) 50 MCG/ACT nasal spray Place 2 sprays into both nostrils daily as needed (allergies or post-nasl drip).    levETIRAcetam (KEPPRA) 500 MG tablet Take 1 tablet (500 mg total) by mouth every 12 (twelve) hours.   levothyroxine (SYNTHROID) 50 MCG tablet TAKE 1 TABLET ON MONDAY THROUGH [facility] (Patient taking differently: TAKE 1 TABLET ON Saturday and Sunday)   levothyroxine (SYNTHROID) 75 MCG tablet TAKE 1 TABLET IN THE MORNING BEFORE BREAKFAST ON THURSDAY THROUGH SUNDAY (TAKE 50 MCG ON OTHER DAYS) (Patient taking differently: TAKE 1 TABLET IN THE MORNING BEFORE BREAKFAST ON Monday through Firday (TAKE 50 MCG ON OTHER DAYS))   Multiple Vitamins-Minerals (PRESERVISION/LUTEIN PO) Take by mouth.    NON FORMULARY preser vision 2 tabs daily   NONFORMULARY OR COMPOUNDED ITEM Apply 240 mg topically  daily as needed.   Omega-3 Fatty Acids (FISH OIL) 1000 MG CAPS Take 2,000 mg by mouth 2 (two) times daily.    rosuvastatin (CRESTOR) 20 MG tablet Take 1 tablet (20 mg total) by mouth daily.   vitamin C (ASCORBIC ACID) 500 MG tablet Take 500 mg by mouth daily.   vitamin E 1000 UNIT capsule Take 1,000 Units by mouth at bedtime.   telmisartan (MICARDIS) 20 MG tablet TAKE 1 TABLET DAILY (Patient not taking: Reported  on 05/05/2021)   No current facility-administered medications on file prior to visit.     Allergies:   Phenytoin   Social History   Tobacco Use   Smoking status: Former    Packs/day: 2.00    Years: 30.00    Pack years: 60.00    Types: Cigarettes    Quit date: 11/27/2009    Years since quitting: 11.4   Smokeless tobacco: Never   Tobacco comments:    smoked 2.5 ppd x 5 years, 1ppd x 20+ years  Vaping Use   Vaping Use: Never used  Substance Use Topics   Alcohol use: No    Alcohol/week: 0.0 standard drinks    Comment: patient quit alcohol use 2005.    Drug use: No    Family History: family history includes Cancer in her father; Heart attack in her father; Heart disease in her father and mother; Lung cancer in an other family member. There is no history of Breast cancer.  ROS:   Please see the history of present illness.   (+) Left UE tingling and numbness (+) LE edema Additional pertinent ROS otherwise unremarkable.  EKGs/Labs/Other Studies Reviewed:    The following studies were reviewed today:  Cardiac CTA 04/01/2021: FINDINGS: A 100 kV prospective scan was triggered in the descending thoracic aorta at 111 HU's. Axial non-contrast 3 mm slices were carried out through the heart. The data set was analyzed on a dedicated work station and scored using the [place]. Gantry rotation speed was 250 msecs and collimation was .6 mm. No beta blockade and 0.8 mg of sl NTG was given. The 3D data set was reconstructed in 5% intervals of the 67-82 % of the R-R cycle.  Diastolic phases were analyzed on a dedicated work station using MPR, MIP and VRT modes. The patient received 100 cc of contrast.   Aorta:  Normal size.  Aortic atherosclerosis.  No dissection.   Main Pulmonary Artery: Normal size of the pulmonary artery.   Aortic Valve:  Tri-leaflet.  No calcifications.   Coronary Arteries:  Normal coronary origin. Right dominance.   Coronary Calcium Score:   Left main: 116   Left anterior descending artery: 390   Left circumflex artery: 19   Right coronary artery: 243   Total: 767   Percentile: 98th for age, sex, and race matched control.   RCA is a large dominant artery that gives rise to PDA and PLA. There a minimal non-obstructive calcified plaque in the proximal RCA. There a minimal non-obstructive calcified plaque in the mid RCA. There a minimal non-obstructive calcified plaque in the distal RCA   Left main is a large artery that gives rise to LAD and LCX arteries. There is a < 10% plaque in the proximal and distal left main.   LAD is a large vessel that gives rise to four diagonal vessels. There is a 16 mm length of mild non-obstructive calcified plaque in the proximal LAD. There is a mild non-obstructive calcified plaque at the ostium of the D2 vessel.   LCX is a non-dominant artery that gives rise to one large OM1 branch. There is mild non-obstructive calcified plaque in the proximal OM1.   Other findings:   Normal pulmonary vein drainage into the left atrium.   Normal left atrial appendage without a thrombus.   Cannot exclude small PFO.   Extra-cardiac findings: See attached radiology report for non-cardiac structures.   IMPRESSION: 1. Coronary calcium score of 767. This was 98th percentile for age,  sex, and race matched control.   2. Normal coronary origin with right dominance.   3. CAD-RADS 2. Mild non-obstructive CAD (25-49%). Consider non-atherosclerotic causes of chest pain. Consider preventive therapy and  risk factor modification.   4. Aortic atherosclerosis.  Lexiscan myoview 04/29/20 The left ventricular ejection fraction is hyperdynamic (>65%). Nuclear stress EF: 73%. The TID is increased at 1.34 but visually there does not appear to be transient ischemic dilatation. There was no ST segment deviation noted during stress. The perfusion study is normal. This is a low risk study.  CT angio chest/abdomen 02/14/14 IMPRESSION:  1. No aortic dissection.  No aneurysm.  2. No acute findings in the chest abdomen.  3. No abdominal mass.   Echo 01/30/14 Left ventricle: The cavity size was normal. Wall thickness was    normal. Systolic function was normal. The estimated ejection    fraction was in the range of 60% to 65%. Wall motion was normal;    there were no regional wall motion abnormalities. Doppler    parameters are consistent with abnormal left ventricular    relaxation (grade 1 diastolic dysfunction). The E/e&' ratio is <8,    suggesting normal LV filling pressure.  - Left atrium: The atrium was normal in size.  - Pericardium, extracardiac: There is a heterogenous mass inferior    to the RV which extends toward the apex and compresses the RV -    this is similar in appearance to liver and may represent    hepatomegaly or perhaps mass - consider dedicated    abdominal/thoracic imaging. There is the appearance of possible    mass invading the IVC in image 71. There was no pericardial    effusion.   Impressions:  - No evidence for cardiomegaly. Heterogenous mass comressing the RV    and possibly invading the IVC seen in subcostal images. Recommend    dedicated thoracoabdominal imaging.   EKG:  EKG is personally reviewed.   05/05/2021: not ordered today 10/29/2020: EKG was not ordered. 04/20/20: NSR at 87 bpm  Recent Labs: 09/16/2020: Hemoglobin 13.1; Platelets 225 03/16/2021: ALT 24; BUN 17; Creatinine, Ser 0.69; Potassium 4.6; Sodium 138; TSH 7.320   Recent Lipid Panel     Component Value Date/Time   CHOL 153 09/16/2020 1429   TRIG 95 09/16/2020 1429   HDL 71 09/16/2020 1429   CHOLHDL 2.2 09/16/2020 1429   CHOLHDL 2.8 11/23/2009 0435   VLDL 15 11/23/2009 0435   LDLCALC 65 09/16/2020 1429    Physical Exam:    VS:  BP 114/72   Pulse 90   Ht '5\' 2"'$  (1.575 m)   Wt 160 lb 6.4 oz (72.8 kg)   SpO2 95%   BMI 29.34 kg/m     Wt Readings from Last 3 Encounters:  05/05/21 160 lb 6.4 oz (72.8 kg)  03/29/21 160 lb (72.6 kg)  03/24/21 159 lb (72.1 kg)    GEN: Well nourished, well developed in no acute distress HEENT: Normal, moist mucous membranes NECK: No JVD CARDIAC: regular rhythm, normal S1 and S2, no rubs or gallops. No murmur. VASCULAR: Radial and DP pulses 2+ bilaterally. No carotid bruits RESPIRATORY:  Clear to auscultation without rales, wheezing or rhonchi  ABDOMEN: Soft, non-tender, non-distended MUSCULOSKELETAL:  Ambulates independently SKIN: Warm and dry, no edema NEUROLOGIC:  Alert and oriented x 3. No focal neuro deficits noted. PSYCHIATRIC:  Normal affect     ASSESSMENT:    1. Encounter to discuss test results   2. Aortic  atherosclerosis ([place])   3. Nonocclusive coronary atherosclerosis of native coronary artery   4. Hyperlipidemia LDL goal <70   5. History of CVA (cerebrovascular accident)   6. Family history of heart disease     PLAN:    Arm numbness: -CCTA ordered by Laurann Montana to rule out cardiac etiology. Nonobstructive CAD and coronary calcium noted. -had MRI spine with abnormalities, being evaluated for nerve compression  History of CVA, with history of aneurysms Hypertension -continue telmisartan  Nonobstructive CAD Coronary Calcium Aortic atherosclerosis Hyperlipidemia -secondary prevention discussed -continue aspirin, clopidogrel per neurology -continue rosuvastatin 20 mg daily -LDL goal <70  Cardiac risk counseling and prevention recommendations: with family history of CV disease -recommend heart  healthy/Mediterranean diet, with whole grains, fruits, vegetable, fish, lean meats, nuts, and olive oil. Limit salt. -recommend moderate walking, 3-5 times/week for 30-50 minutes each session. Aim for at least 150 minutes.week. Goal should be pace of 3 miles/hours, or walking 1.5 miles in 30 minutes -recommend avoidance of tobacco products. Avoid excess alcohol.  Plan for follow up: 6 mos for visit  Buford Dresser, MD, PhD [place]  [facility]    Medication Adjustments/Labs and Tests Ordered: Current medicines are reviewed at length with the patient today.  Concerns regarding medicines are outlined above.   No orders of the defined types were placed in this encounter.  No orders of the defined types were placed in this encounter.  Patient Instructions  Medication Instructions:  Your physician recommends that you continue on your current medications as directed. Please refer to the Current Medication list given to you today.   *If you need a refill on your cardiac medications before your next appointment, please call your pharmacy*  Lab Work: none  Testing/Procedures: none  Follow-Up: At [facility], you and your health needs are our priority.  As part of our continuing mission to provide you with exceptional heart care, we have created designated Provider Care Teams.  These Care Teams include your primary Cardiologist (physician) and Advanced Practice Providers (APPs -  Physician Assistants and Nurse Practitioners) who all work together to provide you with the care you need, when you need it.  We recommend signing up for the patient portal called "MyChart".  Sign up information is provided on this After Visit Summary.  MyChart is used to connect with patients for Virtual Visits (Telemedicine).  Patients are able to view lab/test results, encounter notes, upcoming appointments, etc.  Non-urgent messages can be sent to your provider as well.   To learn more about what  you can do with MyChart, go to [email or web address].    Your next appointment:   6 month(s)  The format for your next appointment:   In Person  Provider:   Buford Dresser, MD    [facility]ump[facility],acting as a scribe for Buford Dresser, MD.,have documented all relevant documentation on the behalf of Buford Dresser, MD,as directed by  Buford Dresser, MD while in the presence of Buford Dresser, MD.  I, Buford Dresser, MD, have reviewed all documentation for this visit. The documentation on 05/05/21 for the exam, diagnosis, procedures, and orders are all accurate and complete.   Signed, Buford Dresser, MD PhD 05/05/2021  [facility]

## 2021-05-05 NOTE — Patient Instructions (Addendum)
Medication Instructions:  Your physician recommends that you continue on your current medications as directed. Please refer to the Current Medication list given to you today.   *If you need a refill on your cardiac medications before your next appointment, please call your pharmacy*  Lab Work: none  Testing/Procedures: none  Follow-Up: At [facility], you and your health needs are our priority.  As part of our continuing mission to provide you with exceptional heart care, we have created designated Provider Care Teams.  These Care Teams include your primary Cardiologist (physician) and Advanced Practice Providers (APPs -  Physician Assistants and Nurse Practitioners) who all work together to provide you with the care you need, when you need it.  We recommend signing up for the patient portal called "MyChart".  Sign up information is provided on this After Visit Summary.  MyChart is used to connect with patients for Virtual Visits (Telemedicine).  Patients are able to view lab/test results, encounter notes, upcoming appointments, etc.  Non-urgent messages can be sent to your provider as well.   To learn more about what you can do with MyChart, go to [email or web address].    Your next appointment:   6 month(s)  The format for your next appointment:   In Person  Provider:   Buford Dresser, MD

## 2021-05-06 ENCOUNTER — Ambulatory Visit: Payer: Medicare Other

## 2021-05-06 ENCOUNTER — Ambulatory Visit: Payer: Medicare Other | Admitting: Occupational Therapy

## 2021-05-11 ENCOUNTER — Ambulatory Visit: Payer: Medicare Other | Attending: Internal Medicine

## 2021-05-11 ENCOUNTER — Other Ambulatory Visit: Payer: Self-pay

## 2021-05-11 DIAGNOSIS — M6281 Muscle weakness (generalized): Secondary | ICD-10-CM | POA: Insufficient documentation

## 2021-05-11 DIAGNOSIS — R262 Difficulty in walking, not elsewhere classified: Secondary | ICD-10-CM | POA: Insufficient documentation

## 2021-05-11 DIAGNOSIS — R2681 Unsteadiness on feet: Secondary | ICD-10-CM | POA: Diagnosis not present

## 2021-05-11 DIAGNOSIS — R278 Other lack of coordination: Secondary | ICD-10-CM | POA: Insufficient documentation

## 2021-05-11 DIAGNOSIS — R2689 Other abnormalities of gait and mobility: Secondary | ICD-10-CM | POA: Diagnosis not present

## 2021-05-11 DIAGNOSIS — G8111 Spastic hemiplegia affecting right dominant side: Secondary | ICD-10-CM | POA: Diagnosis not present

## 2021-05-11 NOTE — Therapy (Signed)
[facility] [address] [place], [place], [address] Phone: [phone]   Fax:  [phone]  Physical Therapy Treatment  Patient Details  Name: [name] MRN: [id number] Date of Birth: 12/03/1955 Referring Provider (PT): Frann Rider, NP   Encounter Date: 05/11/2021   PT End of Session - 05/11/21 1015     Visit Number 9    Number of Visits 13    Date for PT Re-Evaluation 05/21/21    Progress Note Due on Visit 10    PT Start Time 1014    PT Stop Time 1100    PT Time Calculation (min) 46 min    Equipment Utilized During Treatment Gait belt    Activity Tolerance Patient tolerated treatment well    Behavior During Therapy WFL for tasks assessed/performed             Past Medical History:  Diagnosis Date   Abnormal gait    Amnesia    Anemia    hx   Aneurysm (HCC)    Anxiety    Asthma    Atrophic vaginitis    Benign hypertensive heart disease    Cardiomegaly    Chest pain    COPD (chronic obstructive pulmonary disease) ([place])     - PFTs  01/24/06 FEV1 69% ratio 68% diffusing capacity 57% with no improvement after B2   - PFT's 10/1/ 09 FEV1 77   ratio  76    dlco                   45            no resp to B2    - Nl alpha one antitripsin level 10/09   Cough    Depression    Difficulty speaking    Disorder of nasal cavity    Dyspnea    Enthesopathy of elbow region    GERD (gastroesophageal reflux disease)    Hammer toe    Headache(784.0)    Hemiparesis affecting dominant side as late effect of cerebrovascular accident ([place])    History of kidney stones    Hyperlipidemia    Hypertension    Hypothyroidism    Increased urinary frequency    Low compliance bladder    Macular degeneration of both eyes    Malaise and fatigue    Nonruptured cerebral aneurysm    Obesity    Pneumonia    hx   Pure hypercholesterolemia    Recurrent major depressive episodes ([place])    Seizure disorder, secondary  ([place])    2-3 focal siezures monthly   Seizures ([place])    Short-term memory loss    since stroke   Skin sensation disturbance    Stroke ([place]) 2011   rt sided weakness   Swelling of limb    TMJ (temporomandibular joint disorder)    Urinary tract infectious disease    Vertigo as late effect of stroke    Vitamin D deficiency     Past Surgical History:  Procedure Laterality Date   BACK SURGERY     CEREBRAL ANGIOGRAM     x3   COLONOSCOPY WITH PROPOFOL N/A 08/18/2016   Procedure: COLONOSCOPY WITH PROPOFOL;  Surgeon: Carol Ada, MD;  Location: [facility];  Service: Endoscopy;  Laterality: N/A;   LAPAROSCOPIC HYSTERECTOMY     MANDIBLE FRACTURE SURGERY     RADIOLOGY WITH ANESTHESIA N/A 08/21/2013   Procedure: RADIOLOGY WITH ANESTHESIA;  Surgeon: Rob Hickman, MD;  Location: [facility];  Service: Radiology;  Laterality: N/A;   stones     no surgery    There were no vitals filed for this visit.   Subjective Assessment - 05/11/21 1016     Subjective Pt reports that she is doing ok other than father just got put on hospice. She may have to cancel last minute due to this.    Patient is accompained by: Family member   Husband   Pertinent History left frontoparietal parenchymal hemorrhage (2011), mild cognitive impairment, seizures, chronic headaches, cerebral aneurysm s/p stenting 2014, essential tremors, COPD and HTN, macular degeneration, Hypothyroidism, HLD, GERD, Hammer Toe, Depression, Cardiomegaly, Asthma, Anxiety and Anemia    Limitations Standing;Walking    Patient Stated Goals Improve Balance; Try to get toes in good position    Currently in Pain? Yes    Pain Location Leg    Pain Orientation Right    Pain Descriptors / Indicators Aching    Pain Type Chronic pain    Pain Onset More than a month ago    Pain Frequency Intermittent    Pain Onset More than a month ago                               [facility] Adult PT Treatment/Exercise - 05/11/21 1017        Transfers   Transfers Sit to Stand;Stand to Sit    Sit to Stand 5: Supervision    Sit to Stand Details Verbal cues for technique    Stand to Sit 5: Supervision      Ambulation/Gait   Ambulation/Gait Yes    Ambulation/Gait Assistance 5: Supervision;4: Min guard    Ambulation/Gait Assistance Details 1st bout without AD with PT helping to faciliate right weight shift and verbal and tactile cues to tighten right gluts during stance and try to get larger gentle step on left foot. 2nd bout with cane with improved right SLS time.    Ambulation Distance (Feet) 230 Feet   115' with cane   Assistive device None;Straight cane   with quad tip   Gait Pattern Step-through pattern;Decreased stance time - right;Decreased step length - left    Ambulation Surface Level;Indoor      Neuro Re-ed    Neuro Re-ed Details  Dynamic gait activities: weaving in and out of 4 cones without AD x 2 bouts then x 2 bouts with SPC. Tapping each cone then stepping over x 4 bouts with 4 canes with cane. Tall kneeling on mat: maintaining upright posture without UE support x 1 min then weight shifting side to side x 10, hip hinge x 10 CGA/min assist at times, tall kneeling with D1 diagonals moving 2.2# med ball in each diagonal 5 x 2 each way. CGA and tactile/verbal cues to engage core and squeeze her bottom. Standing in front of mat at 4" step: tapping with left  foot x 10 with PT stabilizing at right knee CGA with light cane support, standing with LLE on step holding position 20 sec x 2 to increase right weight shift with PT providing tactile cues at knee, standing with LLE on step with reaching across body to grab bean bag and toss in basket x 14 with switching halfway to grab with right hand and then hand to left hand CGA at right knee. Step-ups with cane on 4" step x 5 with PT stabilizing at right knee CGA/min assist for balance at times.  PT Short Term Goals - 04/28/21 1017       PT SHORT  TERM GOAL #1   Title Patient will be IND with initial HEP for strength/balance (ALL STGS Due: 04/30/21)    Baseline 04/28/21 Pt reports compliance with current HEP. Denies any questions.    Time 3    Period Weeks    Status Achieved    Target Date 04/30/21      PT SHORT TERM GOAL #2   Title Patient will report understanding of fall prevention within the home to promote improved safety    Baseline Pt able to verbalize fall prevention strategies: turning on lights, removing rugs, keeping floors clear.    Time 3    Period Weeks    Status Achieved      PT SHORT TERM GOAL #3   Title Patient will improve 5x sit <> stand to </= 20 seconds to demo improved balance and strength    Baseline 22 seconds with UE support. 04/28/21 19 sec    Time 3    Period Weeks    Status Achieved      PT SHORT TERM GOAL #4   Title Patient will improve Berg Balance to >/= 35/56 to demo improved balance and reduced fall risk    Baseline 30/56, 04/28/21 39/56    Time 3    Period Weeks    Status Achieved               PT Long Term Goals - 04/26/21 2158       PT LONG TERM GOAL #1   Title Patient will be independent with final HEP for balance/strengthening (ALL LTGs Due: 05/21/21)    Baseline No HEP established    Time 6    Period Weeks    Status New      PT LONG TERM GOAL #2   Title Patient will improve Berg Balance to >/= 40/56 to demo improved balance and reduced fall risk    Baseline 30/56    Time 6    Period Weeks    Status New      PT LONG TERM GOAL #3   Title Patient will improve 5x sit <> stand to </= 15 seconds to demo reduced fall risk    Baseline 22 secs    Time 6    Period Weeks    Status New      PT LONG TERM GOAL #4   Title Patient will improve gait speed to >/= 2.0 ft/sec with LRAD and potential AFO for improved community mobility    Baseline 1.41 ft/sec    Time 6    Period Weeks    Status New      PT LONG TERM GOAL #5   Title Patient will improve FOTO >/= 55%    Baseline  48%    Time 6    Period Weeks    Status New                   Plan - 05/11/21 1111     Clinical Impression Statement PT continued to work on gait and increasing RLE weight shift/strength with NMR. Pt continues to be more stable with use of cane and advised to use for any longer walks. Towards end of session pt's right leg fatigued and was less stable. Pt was greatly challenged with stepping up on step with only cane support.    Personal Factors and Comorbidities Comorbidity 3+    Comorbidities left frontoparietal  parenchymal hemorrhage (2011), mild cognitive impairment, seizures, chronic headaches, cerebral aneurysm s/p stenting 2014, essential tremors, COPD and HTN, macular degeneration, Hypothyroidism, HLD, GERD, Hammer Toe, Depression, Cardiomegaly, Asthma, Anxiety and Anemia    Examination-Activity Limitations Transfers;Locomotion Level;Bend;Dressing;Stand;Stairs;Reach Overhead    Examination-Participation Restrictions Cleaning;Meal Prep;Community Activity    Stability/Clinical Decision Making Stable/Uncomplicated    Rehab Potential Good    PT Frequency 2x / week    PT Duration 6 weeks    PT Treatment/Interventions ADLs/Self Care Home Management;Aquatic Therapy;Electrical Stimulation;Moist Heat;Cryotherapy;DME Instruction;Gait training;Stair training;Functional mobility training;Therapeutic activities;Therapeutic exercise;Balance training;Neuromuscular re-education;Patient/family education;Orthotic Fit/Training;Manual techniques;Taping;Vestibular;Passive range of motion;Dry needling    PT Next Visit Plan 10th visit progress note next visit. Continue with hip abductor, extensor and quad and hamstring strengthening. Continue with tall kneeling. Step-ups with less support, gait with cane on varied surfaces, balance training. -- Posterior ottobock with lateral t strap worked best on AFO trials but continue to work with pt in her ASO to see if we can improve strength enough that AFO may  not be needed. She did bring her Bioness from home but had not worn in 2 years and was not charged. Has heel switch as well. May try in future but again feel strengthening first may be better option as weakness in knee and hip affecting her the most.    Consulted and Agree with Plan of Care Patient             Patient will benefit from skilled therapeutic intervention in order to improve the following deficits and impairments:  Abnormal gait, Decreased coordination, Decreased range of motion, Difficulty walking, Pain, Postural dysfunction, Decreased strength, Decreased balance, Decreased knowledge of use of DME, Impaired tone, Decreased safety awareness, Decreased endurance, Decreased activity tolerance  Visit Diagnosis: Other abnormalities of gait and mobility  Muscle weakness (generalized)  Unsteadiness on feet     Problem List Patient Active Problem List   Diagnosis Date Noted   Family history of heart disease 06/14/2020   History of CVA (cerebrovascular accident) 06/14/2020   Exudative age-related macular degeneration of right eye with active choroidal neovascularization ([place]) 09/11/2019   Nocturnal hypoxemia 09/11/2019   Non-restorative sleep 06/13/2019   Benign myoclonus of drowsiness 06/13/2019   Excessive daytime sleepiness 06/13/2019   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   Hemiparesis affecting right side as late effect of cerebrovascular accident ([place]) 06/13/2019   Macular degeneration of right eye 06/11/2019   Class 1 obesity due to excess calories with serious comorbidity and body mass index (BMI) of 31.0 to 31.9 in adult 06/11/2019   Moderate episode of recurrent major depressive disorder ([place]) 06/11/2019   Spastic hemiparesis ([place]) 03/25/2019   Prediabetes 01/01/2019   Acquired hypothyroidism 01/01/2019   Hypertension 01/01/2019   Aortic atherosclerosis ([place]) 11/07/2018   History of embolic stroke without residual deficits 03/29/2018   Seizure  disorder ([place]) 01/12/2017   Common migraine 01/12/2017   Aneurysm ([place])    Brain aneurysm 08/21/2013   Right foot drop 04/10/2013   Intracerebral hemorrhage ([place]) 03/19/2013   Convulsions ([place]) 03/19/2013   Headache 03/19/2013   Abnormality of gait 03/19/2013   Edema 02/23/2011   COUGH 06/10/2008   HYPERLIPIDEMIA 04/25/2008   TOBACCO ABUSE 04/25/2008   ASTHMA 04/25/2008   Chronic obstructive pulmonary disease ([place]) 04/25/2008    Electa Sniff, PT, DPT, NCS 05/11/2021, 11:16 AM  [facility] [address] [place], [place], [address] Phone: [phone]   Fax:  [phone]  Name: Laini Rongey  Rorrer MRN: [id number] Date of Birth: 1955-09-14

## 2021-05-12 ENCOUNTER — Ambulatory Visit: Payer: Medicare Other | Admitting: Occupational Therapy

## 2021-05-13 ENCOUNTER — Ambulatory Visit: Payer: Medicare Other

## 2021-05-13 ENCOUNTER — Other Ambulatory Visit: Payer: Self-pay

## 2021-05-13 DIAGNOSIS — R278 Other lack of coordination: Secondary | ICD-10-CM | POA: Diagnosis not present

## 2021-05-13 DIAGNOSIS — R262 Difficulty in walking, not elsewhere classified: Secondary | ICD-10-CM

## 2021-05-13 DIAGNOSIS — M6281 Muscle weakness (generalized): Secondary | ICD-10-CM

## 2021-05-13 DIAGNOSIS — R2689 Other abnormalities of gait and mobility: Secondary | ICD-10-CM | POA: Diagnosis not present

## 2021-05-13 DIAGNOSIS — G8111 Spastic hemiplegia affecting right dominant side: Secondary | ICD-10-CM | POA: Diagnosis not present

## 2021-05-13 DIAGNOSIS — R2681 Unsteadiness on feet: Secondary | ICD-10-CM

## 2021-05-13 NOTE — Therapy (Signed)
[facility] [address] [place], [place], [address] Phone: [phone]   Fax:  [phone]  Physical Therapy Treatment/Progress Note  Patient Details  Name: [name] MRN: [id number] Date of Birth: 08/21/1956 Referring Provider (PT): Frann Rider, NP  Physical Therapy Progress Note   Dates of Reporting Period: 04/08/2021 - 05/13/2021  See Note below for Objective Data and Assessment of Progress/Goals.  Thank you for the referral of this patient. Guillermina [place], PT, DPT  Encounter Date: 05/13/2021   PT End of Session - 05/13/21 1021     Visit Number 10    Number of Visits 13    Date for PT Re-Evaluation 05/21/21    Progress Note Due on Visit 10    PT Start Time 1016    PT Stop Time 1059    PT Time Calculation (min) 43 min    Equipment Utilized During Treatment Gait belt    Activity Tolerance Patient tolerated treatment well    Behavior During Therapy WFL for tasks assessed/performed             Past Medical History:  Diagnosis Date   Abnormal gait    Amnesia    Anemia    hx   Aneurysm (HCC)    Anxiety    Asthma    Atrophic vaginitis    Benign hypertensive heart disease    Cardiomegaly    Chest pain    COPD (chronic obstructive pulmonary disease) ([place])     - PFTs  01/24/06 FEV1 69% ratio 68% diffusing capacity 57% with no improvement after B2   - PFT's 10/1/ 09 FEV1 77   ratio  76    dlco                   45            no resp to B2    - Nl alpha one antitripsin level 10/09   Cough    Depression    Difficulty speaking    Disorder of nasal cavity    Dyspnea    Enthesopathy of elbow region    GERD (gastroesophageal reflux disease)    Hammer toe    Headache(784.0)    Hemiparesis affecting dominant side as late effect of cerebrovascular accident ([place])    History of kidney stones    Hyperlipidemia    Hypertension    Hypothyroidism    Increased urinary frequency    Low compliance bladder     Macular degeneration of both eyes    Malaise and fatigue    Nonruptured cerebral aneurysm    Obesity    Pneumonia    hx   Pure hypercholesterolemia    Recurrent major depressive episodes ([place])    Seizure disorder, secondary ([place])    2-3 focal siezures monthly   Seizures ([place])    Short-term memory loss    since stroke   Skin sensation disturbance    Stroke ([place]) 2011   rt sided weakness   Swelling of limb    TMJ (temporomandibular joint disorder)    Urinary tract infectious disease    Vertigo as late effect of stroke    Vitamin D deficiency     Past Surgical History:  Procedure Laterality Date   BACK SURGERY     CEREBRAL ANGIOGRAM     x3   COLONOSCOPY WITH PROPOFOL N/A 08/18/2016   Procedure: COLONOSCOPY WITH PROPOFOL;  Surgeon: Carol Ada, MD;  Location: [facility];  Service: Endoscopy;  Laterality: N/A;   LAPAROSCOPIC HYSTERECTOMY     MANDIBLE FRACTURE SURGERY     RADIOLOGY WITH ANESTHESIA N/A 08/21/2013   Procedure: RADIOLOGY WITH ANESTHESIA;  Surgeon: Rob Hickman, MD;  Location: [facility];  Service: Radiology;  Laterality: N/A;   stones     no surgery    There were no vitals filed for this visit.   Subjective Assessment - 05/13/21 1019     Subjective No other new changes. Has officially put father on hospice. Patient reports some pain in the neck this morning.    Patient is accompained by: Family member   Husband   Pertinent History left frontoparietal parenchymal hemorrhage (2011), mild cognitive impairment, seizures, chronic headaches, cerebral aneurysm s/p stenting 2014, essential tremors, COPD and HTN, macular degeneration, Hypothyroidism, HLD, GERD, Hammer Toe, Depression, Cardiomegaly, Asthma, Anxiety and Anemia    Limitations Standing;Walking    Patient Stated Goals Improve Balance; Try to get toes in good position    Currently in Pain? Yes    Pain Score 5     Pain Location Neck    Pain Orientation Right    Pain Descriptors / Indicators Aching     Pain Type Chronic pain    Pain Onset More than a month ago    Pain Frequency Intermittent    Pain Onset More than a month ago               [facility] Adult PT Treatment/Exercise - 05/13/21 0001       Transfers   Transfers Sit to Stand;Stand to Sit    Sit to Stand 5: Supervision    Five time sit to stand comments  17.31 secs without UE support from standard chair. improved forward lean and control noted throughout, one instance of decreased descent toward end with fatigue.    Stand to Sit 5: Supervision    Stand to Sit Details cues for aligning with chair prior to descent for improved control and steadiness with transfers, able to demo improvements with verbal cues and demonstration.      Ambulation/Gait   Ambulation/Gait Yes    Ambulation/Gait Assistance 5: Supervision;4: Min guard    Ambulation/Gait Assistance Details Completed ambulation around therapy gym with working toward improved scanning enivronement with use of SPC x 200 ft. Added in horizontal/vertial head turns, most challenge noted with looking down often starting to lsoe balance forward with CGA.    Ambulation Distance (Feet) 200 Feet   plus additional clinic distance   Assistive device None;Straight cane    Gait Pattern Step-through pattern;Decreased stance time - right;Decreased step length - left    Ambulation Surface Level;Indoor    Gait velocity 17.06 secs = 1.92 ft/sec   17.69 secs without AD, mild unsteadiness/veering noted     High Level Balance   High Level Balance Activities Negotitating around obstacles;Negotiating over obstacles;Sudden stops;Direction changes    High Level Balance Comments With obstacle course and no AD, completed negotiation over hurdles and working on walking around obstacles (cones) x 2 laps. Increased challenge noted iwth hurdles requiring CGA and intermittent Min A. Completed ambulation with working on sudden stops and direction changes to R/L with use of SPC x 200 ft.      Neuro Re-ed     Neuro Re-ed Details  Completed steps up with lgiht countertop support on L side wokring toward improved curb negotiation, alternating stepping up with both LE's for BLE strengthening. Unable to complete withotu UE support, CGA throughout. Cues  for posture and weight shift. Completed staggered stance without UE support, altenrating foot position, completed 2 x 20-30 seconds each. Increased challenge with RLE posterior require CGA for balance.               PT Short Term Goals - 04/28/21 1017       PT SHORT TERM GOAL #1   Title Patient will be IND with initial HEP for strength/balance (ALL STGS Due: 04/30/21)    Baseline 04/28/21 Pt reports compliance with current HEP. Denies any questions.    Time 3    Period Weeks    Status Achieved    Target Date 04/30/21      PT SHORT TERM GOAL #2   Title Patient will report understanding of fall prevention within the home to promote improved safety    Baseline Pt able to verbalize fall prevention strategies: turning on lights, removing rugs, keeping floors clear.    Time 3    Period Weeks    Status Achieved      PT SHORT TERM GOAL #3   Title Patient will improve 5x sit <> stand to </= 20 seconds to demo improved balance and strength    Baseline 22 seconds with UE support. 04/28/21 19 sec    Time 3    Period Weeks    Status Achieved      PT SHORT TERM GOAL #4   Title Patient will improve Berg Balance to >/= 35/56 to demo improved balance and reduced fall risk    Baseline 30/56, 04/28/21 39/56    Time 3    Period Weeks    Status Achieved               PT Long Term Goals - 04/26/21 2158       PT LONG TERM GOAL #1   Title Patient will be independent with final HEP for balance/strengthening (ALL LTGs Due: 05/21/21)    Baseline No HEP established    Time 6    Period Weeks    Status New      PT LONG TERM GOAL #2   Title Patient will improve Berg Balance to >/= 40/56 to demo improved balance and reduced fall risk    Baseline 30/56     Time 6    Period Weeks    Status New      PT LONG TERM GOAL #3   Title Patient will improve 5x sit <> stand to </= 15 seconds to demo reduced fall risk    Baseline 22 secs    Time 6    Period Weeks    Status New      PT LONG TERM GOAL #4   Title Patient will improve gait speed to >/= 2.0 ft/sec with LRAD and potential AFO for improved community mobility    Baseline 1.41 ft/sec    Time 6    Period Weeks    Status New      PT LONG TERM GOAL #5   Title Patient will improve FOTO >/= 55%    Baseline 48%    Time 6    Period Weeks    Status New                   Plan - 05/13/21 1140     Clinical Impression Statement Assessed progress toward goals for Progress Note. Patient demonstrating improvements with 5x sit <> stand of 17.31 seconds and improved gait speed with SPC to 1.92 ft/sec. Continued dynamic  gait activities without and without AD. Significant more balance challenge noted iwthout AD negoitating over/around obstacles, requiring CGA and intermittent Min A at times. Right leg still continues to fatigue and reduced stability noted. Will continue to progress toward goals.    Personal Factors and Comorbidities Comorbidity 3+    Comorbidities left frontoparietal parenchymal hemorrhage (2011), mild cognitive impairment, seizures, chronic headaches, cerebral aneurysm s/p stenting 2014, essential tremors, COPD and HTN, macular degeneration, Hypothyroidism, HLD, GERD, Hammer Toe, Depression, Cardiomegaly, Asthma, Anxiety and Anemia    Examination-Activity Limitations Transfers;Locomotion Level;Bend;Dressing;Stand;Stairs;Reach Overhead    Examination-Participation Restrictions Cleaning;Meal Prep;Community Activity    Stability/Clinical Decision Making Stable/Uncomplicated    Rehab Potential Good    PT Frequency 2x / week    PT Duration 6 weeks    PT Treatment/Interventions ADLs/Self Care Home Management;Aquatic Therapy;Electrical Stimulation;Moist Heat;Cryotherapy;DME  Instruction;Gait training;Stair training;Functional mobility training;Therapeutic activities;Therapeutic exercise;Balance training;Neuromuscular re-education;Patient/family education;Orthotic Fit/Training;Manual techniques;Taping;Vestibular;Passive range of motion;Dry needling    PT Next Visit Plan Continue with hip abductor, extensor and quad and hamstring strengthening. Continue with tall kneeling. Step-ups with less support, gait with cane on varied surfaces, balance training. -- Posterior ottobock with lateral t strap worked best on AFO trials but continue to work with pt in her ASO to see if we can improve strength enough that AFO may not be needed. She did bring her Bioness from home but had not worn in 2 years and was not charged. Has heel switch as well. May try in future but again feel strengthening first may be better option as weakness in knee and hip affecting her the most.    Consulted and Agree with Plan of Care Patient             Patient will benefit from skilled therapeutic intervention in order to improve the following deficits and impairments:  Abnormal gait, Decreased coordination, Decreased range of motion, Difficulty walking, Pain, Postural dysfunction, Decreased strength, Decreased balance, Decreased knowledge of use of DME, Impaired tone, Decreased safety awareness, Decreased endurance, Decreased activity tolerance  Visit Diagnosis: Other abnormalities of gait and mobility  Muscle weakness (generalized)  Unsteadiness on feet  Difficulty in walking, not elsewhere classified     Problem List Patient Active Problem List   Diagnosis Date Noted   Family history of heart disease 06/14/2020   History of CVA (cerebrovascular accident) 06/14/2020   Exudative age-related macular degeneration of right eye with active choroidal neovascularization ([place]) 09/11/2019   Nocturnal hypoxemia 09/11/2019   Non-restorative sleep 06/13/2019   Benign myoclonus of drowsiness 06/13/2019    Excessive daytime sleepiness 06/13/2019   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   Hemiparesis affecting right side as late effect of cerebrovascular accident ([place]) 06/13/2019   Macular degeneration of right eye 06/11/2019   Class 1 obesity due to excess calories with serious comorbidity and body mass index (BMI) of 31.0 to 31.9 in adult 06/11/2019   Moderate episode of recurrent major depressive disorder ([place]) 06/11/2019   Spastic hemiparesis ([place]) 03/25/2019   Prediabetes 01/01/2019   Acquired hypothyroidism 01/01/2019   Hypertension 01/01/2019   Aortic atherosclerosis ([place]) 11/07/2018   History of embolic stroke without residual deficits 03/29/2018   Seizure disorder ([place]) 01/12/2017   Common migraine 01/12/2017   Aneurysm ([place])    Brain aneurysm 08/21/2013   Right foot drop 04/10/2013   Intracerebral hemorrhage ([place]) 03/19/2013   Convulsions ([place]) 03/19/2013   Headache 03/19/2013   Abnormality of gait 03/19/2013   Edema 02/23/2011   COUGH 06/10/2008   HYPERLIPIDEMIA  04/25/2008   TOBACCO ABUSE 04/25/2008   ASTHMA 04/25/2008   Chronic obstructive pulmonary disease ([place]) 04/25/2008    Jones Bales, PT, DPT 05/13/2021, 11:43 AM  [facility] [address] [place], [place], [address] Phone: [phone]   Fax:  [phone]  Name: [name] MRN: [id number] Date of Birth: 1955-11-19

## 2021-05-17 ENCOUNTER — Ambulatory Visit (INDEPENDENT_AMBULATORY_CARE_PROVIDER_SITE_OTHER): Payer: Medicare Other | Admitting: Pulmonary Disease

## 2021-05-17 ENCOUNTER — Ambulatory Visit: Payer: Medicare Other | Admitting: Occupational Therapy

## 2021-05-17 ENCOUNTER — Encounter: Payer: Self-pay | Admitting: Pulmonary Disease

## 2021-05-17 ENCOUNTER — Ambulatory Visit: Payer: Medicare Other

## 2021-05-17 ENCOUNTER — Other Ambulatory Visit: Payer: Self-pay

## 2021-05-17 VITALS — BP 110/78 | HR 103 | Temp 98.0°F | Ht 62.0 in | Wt 159.0 lb

## 2021-05-17 DIAGNOSIS — J449 Chronic obstructive pulmonary disease, unspecified: Secondary | ICD-10-CM | POA: Diagnosis not present

## 2021-05-17 DIAGNOSIS — R278 Other lack of coordination: Secondary | ICD-10-CM | POA: Diagnosis not present

## 2021-05-17 DIAGNOSIS — R2681 Unsteadiness on feet: Secondary | ICD-10-CM | POA: Diagnosis not present

## 2021-05-17 DIAGNOSIS — R0602 Shortness of breath: Secondary | ICD-10-CM | POA: Diagnosis not present

## 2021-05-17 DIAGNOSIS — R262 Difficulty in walking, not elsewhere classified: Secondary | ICD-10-CM | POA: Diagnosis not present

## 2021-05-17 DIAGNOSIS — M6281 Muscle weakness (generalized): Secondary | ICD-10-CM | POA: Diagnosis not present

## 2021-05-17 DIAGNOSIS — R2689 Other abnormalities of gait and mobility: Secondary | ICD-10-CM | POA: Diagnosis not present

## 2021-05-17 DIAGNOSIS — G8111 Spastic hemiplegia affecting right dominant side: Secondary | ICD-10-CM | POA: Diagnosis not present

## 2021-05-17 NOTE — Patient Instructions (Signed)
Continue albuterol as needed  Oxygen supplementation  Graded exercise as tolerated  I will see you back in 6 months

## 2021-05-17 NOTE — Therapy (Signed)
[facility] [address] [place], [place], [address] Phone: [phone]   Fax:  [phone]  Occupational Therapy Treatment  Patient Details  Name: [name] MRN: [id number] Date of Birth: 1955/12/18 No data recorded  Encounter Date: 05/17/2021   OT End of Session - 05/17/21 1234     Visit Number 4    Number of Visits 17    Date for OT Re-Evaluation 06/21/21    Authorization Type MCR    Progress Note Due on Visit 10    OT Start Time 96    OT Stop Time 1300   Pt had to leave early for MD appt   OT Time Calculation (min) 30 min    Activity Tolerance Patient tolerated treatment well    Behavior During Therapy [facility] for tasks assessed/performed             Past Medical History:  Diagnosis Date   Abnormal gait    Amnesia    Anemia    hx   Aneurysm ([place])    Anxiety    Asthma    Atrophic vaginitis    Benign hypertensive heart disease    Cardiomegaly    Chest pain    COPD (chronic obstructive pulmonary disease) ([place])     - PFTs  01/24/06 FEV1 69% ratio 68% diffusing capacity 57% with no improvement after B2   - PFT's 10/1/ 09 FEV1 77   ratio  76    dlco                   45            no resp to B2    - Nl alpha one antitripsin level 10/09   Cough    Depression    Difficulty speaking    Disorder of nasal cavity    Dyspnea    Enthesopathy of elbow region    GERD (gastroesophageal reflux disease)    Hammer toe    Headache(784.0)    Hemiparesis affecting dominant side as late effect of cerebrovascular accident ([place])    History of kidney stones    Hyperlipidemia    Hypertension    Hypothyroidism    Increased urinary frequency    Low compliance bladder    Macular degeneration of both eyes    Malaise and fatigue    Nonruptured cerebral aneurysm    Obesity    Pneumonia    hx   Pure hypercholesterolemia    Recurrent major depressive episodes ([place])    Seizure disorder, secondary ([place])    2-3  focal siezures monthly   Seizures (HCC)    Short-term memory loss    since stroke   Skin sensation disturbance    Stroke ([place]) 2011   rt sided weakness   Swelling of limb    TMJ (temporomandibular joint disorder)    Urinary tract infectious disease    Vertigo as late effect of stroke    Vitamin D deficiency     Past Surgical History:  Procedure Laterality Date   BACK SURGERY     CEREBRAL ANGIOGRAM     x3   COLONOSCOPY WITH PROPOFOL N/A 08/18/2016   Procedure: COLONOSCOPY WITH PROPOFOL;  Surgeon: Carol Ada, MD;  Location: [facility];  Service: Endoscopy;  Laterality: N/A;   LAPAROSCOPIC HYSTERECTOMY     MANDIBLE FRACTURE SURGERY     RADIOLOGY WITH ANESTHESIA N/A 08/21/2013   Procedure: RADIOLOGY WITH ANESTHESIA;  Surgeon: Rob Hickman,  MD;  Location: [facility];  Service: Radiology;  Laterality: N/A;   stones     no surgery    There were no vitals filed for this visit.   Subjective Assessment - 05/17/21 1232     Subjective  I've been more conscious of using my hand    Pertinent History Rt spastic hemiparesis from Lt frontoparietal hemorrhage 11/27/2009, cervical spinal stenosis, seizures, chronic headaches, essential tremors, COPD, HTN    Limitations h/o seizures, fall risk    Currently in Pain? Yes    Pain Score 5     Pain Location Neck    Pain Orientation Right    Pain Descriptors / Indicators Aching    Pain Type Chronic pain    Pain Onset More than a month ago    Pain Frequency Intermittent    Aggravating Factors  nothing    Pain Relieving Factors stretching it                          OT Treatments/Exercises (OP) - 05/17/21 0001       ADLs   Writing Pt practiced printing and signing name x 5 each Rt hand      Functional Reaching Activities   Mid Level Pt practiced placing various sized washers on various height/size prongs RUE for coordination and functional reach. Retrieving and replacing cones from mid level shelf RUE            Ambulating carrying plate Rt hand w/o drops/spills, then progressed to carrying cup 1/3 full of water w/o spills.           OT Short Term Goals - 05/17/21 1245       OT SHORT TERM GOAL #1   Title Independent with coordination HEP and functional HEP for Rt hand/RUE    Time 4    Period Weeks    Status Achieved      OT SHORT TERM GOAL #2   Title Pt to verbalize understanding of safety considerations due to lack of sensation RUE    Time 4    Period Weeks    Status Achieved      OT SHORT TERM GOAL #3   Title Pt to improve RUE function as evidenced by performing 22 on [employer] test    Baseline 18    Time 4    Period Weeks    Status New      OT SHORT TERM GOAL #4   Title Pt to feed self 25% with Rt dominant hand    Baseline all Lt hand    Time 4    Period Weeks    Status Achieved   50%     OT SHORT TERM GOAL #5   Title Pt to write name consistently with Rt hand    Baseline Lt hand    Time 4    Period Weeks    Status On-going   pt practiced name and address in print with built up pen at 100% legibility              OT Long Term Goals - 04/21/21 1217       OT LONG TERM GOAL #1   Title Pt independent with updated HEP to including weight bearing    Time 8    Period Weeks    Status New      OT LONG TERM GOAL #2   Title Improve coordination Rt hand as evidenced by  performing 9 hole peg test in 65 sec or less    Baseline 78.87 sec    Time 8    Period Weeks    Status New      OT LONG TERM GOAL #3   Title Pt to consistently feed self at 50% or greater with RUE    Time 8    Period Weeks    Status New      OT LONG TERM GOAL #4   Title Pt to groom self 25% or greater with RUE    Time 8    Period Weeks    Status New                   Plan - 05/17/21 1245     Clinical Impression Statement Pt has met 3 STG's at this time and progressing towards remaining goals    OT Occupational Profile and History Detailed Assessment- Review of Records  and additional review of physical, cognitive, psychosocial history related to current functional performance    Occupational performance deficits (Please refer to evaluation for details): ADL's;IADL's;Social Participation    Body Structure / Function / Physical Skills ADL;Decreased knowledge of use of DME;Strength;Dexterity;GMC;Proprioception;UE functional use;Edema;Body mechanics;IADL;Coordination;Mobility;FMC    Cognitive Skills Memory;Safety Awareness;Problem Solve;Attention    Rehab Potential Fair   time since onset, learned non use   Clinical Decision Making Several treatment options, min-mod task modification necessary    Comorbidities Affecting Occupational Performance: May have comorbidities impacting occupational performance    Modification or Assistance to Complete Evaluation  No modification of tasks or assist necessary to complete eval    OT Frequency 2x / week    OT Duration 8 weeks   plus eval (anticipate only 6 weeks needed)   OT Treatment/Interventions Self-care/ADL training;Fluidtherapy;DME and/or AE instruction;Splinting;Therapeutic activities;Therapeutic exercise;Cognitive remediation/compensation;Coping strategies training;Neuromuscular education;Functional Mobility Training;Passive range of motion;Visual/perceptual remediation/compensation;Patient/family education;Manual Therapy    Plan continue coordination, writing, and progress towards goals    Consulted and Agree with Plan of Care Patient;Family member/caregiver    Family Member Consulted husband             Patient will benefit from skilled therapeutic intervention in order to improve the following deficits and impairments:   Body Structure / Function / Physical Skills: ADL, Decreased knowledge of use of DME, Strength, Dexterity, GMC, Proprioception, UE functional use, Edema, Body mechanics, IADL, Coordination, Mobility, FMC Cognitive Skills: Memory, Safety Awareness, Problem Solve, Attention     Visit  Diagnosis: Other lack of coordination  Muscle weakness (generalized)    Problem List Patient Active Problem List   Diagnosis Date Noted   Family history of heart disease 06/14/2020   History of CVA (cerebrovascular accident) 06/14/2020   Exudative age-related macular degeneration of right eye with active choroidal neovascularization ([place]) 09/11/2019   Nocturnal hypoxemia 09/11/2019   Non-restorative sleep 06/13/2019   Benign myoclonus of drowsiness 06/13/2019   Excessive daytime sleepiness 06/13/2019   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   Hemiparesis affecting right side as late effect of cerebrovascular accident ([place]) 06/13/2019   Macular degeneration of right eye 06/11/2019   Class 1 obesity due to excess calories with serious comorbidity and body mass index (BMI) of 31.0 to 31.9 in adult 06/11/2019   Moderate episode of recurrent major depressive disorder ([place]) 06/11/2019   Spastic hemiparesis ([place]) 03/25/2019   Prediabetes 01/01/2019   Acquired hypothyroidism 01/01/2019   Hypertension 01/01/2019   Aortic atherosclerosis ([place]) 11/07/2018   History of embolic stroke without residual  deficits 03/29/2018   Seizure disorder ([place]) 01/12/2017   Common migraine 01/12/2017   Aneurysm ([place])    Brain aneurysm 08/21/2013   Right foot drop 04/10/2013   Intracerebral hemorrhage ([place]) 03/19/2013   Convulsions ([place]) 03/19/2013   Headache 03/19/2013   Abnormality of gait 03/19/2013   Edema 02/23/2011   COUGH 06/10/2008   HYPERLIPIDEMIA 04/25/2008   TOBACCO ABUSE 04/25/2008   ASTHMA 04/25/2008   Chronic obstructive pulmonary disease ([place]) 04/25/2008    Carey Bullocks, OTR/L 05/17/2021, 1:01 PM  [facility] [address] [place], [place], [address] Phone: [phone]   Fax:  [phone]  Name: [name] MRN: [id number] Date of Birth: 1955/09/21

## 2021-05-17 NOTE — Progress Notes (Signed)
Subjective:    Patient ID: [name], female    DOB: [birth year], 65 y.o.   MRN: [id number]  She feels better overall  Breathing feels about the same Uses nebulization treatments as needed and he does continue to have  Quit smoking 2011 Past history of COPD  She did have a brain bleed in 2011-2 events, had stents placed Prolonged recovery  Continues on oxygen supplementation at night Previous PFT shows obstructive disease with significant bronchodilator response  There may be a component of deconditioning as she has not been very active since Covid  She remembers being on oxygen prior to her stroke, remembers a diagnosis of COPD/emphysema She was using inhalers and nebulizers prior to that  Currently able to ambulate, does get short of breath with exertion Uses a walker, unsteady, if she goes slowly she is able to take of 100 steps  She does have daytime fatigue and sleepiness Multiple medications may be contributing to this   Comorbidities include hypertension, hypercholesterolemia, chronic headaches, CVA    Past Medical History:  Diagnosis Date   Abnormal gait    Amnesia    Anemia    hx   Aneurysm ([place])    Anxiety    Asthma    Atrophic vaginitis    Benign hypertensive heart disease    Cardiomegaly    Chest pain    COPD (chronic obstructive pulmonary disease) ([place])     - PFTs  01/24/06 FEV1 69% ratio 68% diffusing capacity 57% with no improvement after B2   - PFT's 10/1/ 09 FEV1 77   ratio  76    dlco                   45            no resp to B2    - Nl alpha one antitripsin level 10/09   Cough    Depression    Difficulty speaking    Disorder of nasal cavity    Dyspnea    Enthesopathy of elbow region    GERD (gastroesophageal reflux disease)    Hammer toe    Headache(784.0)    Hemiparesis affecting dominant side as late effect of cerebrovascular accident ([place])    History of kidney stones    Hyperlipidemia    Hypertension    Hypothyroidism     Increased urinary frequency    Low compliance bladder    Macular degeneration of both eyes    Malaise and fatigue    Nonruptured cerebral aneurysm    Obesity    Pneumonia    hx   Pure hypercholesterolemia    Recurrent major depressive episodes ([place])    Seizure disorder, secondary ([place])    2-3 focal siezures monthly   Seizures ([place])    Short-term memory loss    since stroke   Skin sensation disturbance    Stroke ([place]) 2011   rt sided weakness   Swelling of limb    TMJ (temporomandibular joint disorder)    Urinary tract infectious disease    Vertigo as late effect of stroke    Vitamin D deficiency    Social History   Socioeconomic History   Marital status: Married    Spouse name: Jori Moll   Number of children: 2   Years of education: 14   Highest education level: Not on file  Occupational History   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    Drug use: No   Sexual activity: Not Currently  Other Topics Concern   Not on file  Social History Narrative   Patient lives at home with her husband Jori Moll).   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    Patient is on disability.    Patient has an Associates degree.   Patient is right-handed.   Patient drinks 2 cups of green tea most days.               Social Determinants of Health   Financial Resource Strain: Low Risk    Difficulty of Paying Living Expenses: Not hard at all  Food Insecurity: No Food Insecurity   Worried  About Charity fundraiser in the Last Year: Never true   [place] in the Last Year: Never true  Transportation Needs: No Transportation Needs   Lack of Transportation (Medical): No   Lack of Transportation (Non-Medical): No  Physical Activity: Inactive   Days of Exercise per Week: 0 days   Minutes of Exercise per Session: 0 min  Stress: No Stress Concern Present   Feeling of Stress : Not at all  Social Connections: Not on file  Intimate Partner Violence: Not on file   Family History  Problem Relation Age of Onset   Heart disease Mother    Heart disease Father    Heart attack Father    Cancer Father        prostate to lymph node   Lung cancer Other    Breast cancer Neg Hx    Review of Systems  Constitutional:  Negative for fever and unexpected weight change.  HENT:  Positive for dental problem, nosebleeds and sneezing. Negative for congestion, ear pain, postnasal drip, rhinorrhea, sinus pressure, sore throat and trouble swallowing.   Eyes:  Negative for redness and itching.  Respiratory:  Positive for cough and shortness of breath. Negative for chest tightness and wheezing.   Cardiovascular:  Negative for palpitations.  Gastrointestinal:  Negative for nausea and vomiting.  Genitourinary:  Negative for dysuria.  Musculoskeletal:  Positive for joint swelling.  Skin:  Negative for rash.  Allergic/Immunologic: Negative.  Negative for environmental allergies, food allergies and immunocompromised state.  Psychiatric/Behavioral:  Negative for dysphoric mood.       Objective:   Physical Exam Constitutional:      Appearance: Normal appearance.  HENT:     Head: Normocephalic and atraumatic.  Cardiovascular:     Rate and Rhythm: Normal rate and regular rhythm.     Pulses: Normal pulses.     Heart sounds: Normal heart sounds. No murmur heard.   No friction rub.  Pulmonary:     Effort: Pulmonary effort is normal. No respiratory distress.     Breath sounds: Normal breath  sounds. No stridor. No wheezing or rhonchi.  Musculoskeletal:     Cervical back: Normal range  of motion. No rigidity or tenderness.  Neurological:     Mental Status: She is alert.  Psychiatric:        Mood and Affect: Mood normal.   Vitals:   05/17/21 1328  BP: 110/78  Pulse: (!) 103  Temp: 98 F (36.7 C)  SpO2: 95%    Recent PFT reviewed showing small airway disease Nocturnal oximetry with nocturnal desaturations    Assessment & Plan:  .  Patient with past history of COPD .  Oxygen supplementation in the past .  To continue oxygen supplementation at great  .  No recent exacerbation of symptoms  .  Still has occasional cough -Not limited   Continue albuterol  Nebulization treatments as needed  .  I will see her back in about 6 months  .  Encouraged to call with any significant concerns .  Call for refills as needed

## 2021-05-18 ENCOUNTER — Ambulatory Visit: Payer: Medicare Other

## 2021-05-18 ENCOUNTER — Ambulatory Visit: Payer: Medicare Other | Admitting: Occupational Therapy

## 2021-05-18 DIAGNOSIS — R2681 Unsteadiness on feet: Secondary | ICD-10-CM

## 2021-05-18 DIAGNOSIS — R262 Difficulty in walking, not elsewhere classified: Secondary | ICD-10-CM | POA: Diagnosis not present

## 2021-05-18 DIAGNOSIS — R278 Other lack of coordination: Secondary | ICD-10-CM | POA: Diagnosis not present

## 2021-05-18 DIAGNOSIS — M6281 Muscle weakness (generalized): Secondary | ICD-10-CM

## 2021-05-18 DIAGNOSIS — G8111 Spastic hemiplegia affecting right dominant side: Secondary | ICD-10-CM

## 2021-05-18 DIAGNOSIS — R2689 Other abnormalities of gait and mobility: Secondary | ICD-10-CM | POA: Diagnosis not present
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
## 2021-05-18 NOTE — Therapy (Signed)
[facility] [address] [place], [place], [address] Phone: [phone]   Fax:  [phone]  Occupational Therapy Treatment  Patient Details  Name: [name] MRN: [id number] Date of Birth: June 27, 1956 No data recorded  Encounter Date: 05/18/2021   OT End of Session - 05/18/21 1110     Visit Number 5    Number of Visits 17    Date for OT Re-Evaluation 06/21/21    Authorization Type MCR    Progress Note Due on Visit 10    OT Start Time 1103    OT Stop Time 1145    OT Time Calculation (min) 42 min    Activity Tolerance Patient tolerated treatment well    Behavior During Therapy WFL for tasks assessed/performed             Past Medical History:  Diagnosis Date   Abnormal gait    Amnesia    Anemia    hx   Aneurysm ([place])    Anxiety    Asthma    Atrophic vaginitis    Benign hypertensive heart disease    Cardiomegaly    Chest pain    COPD (chronic obstructive pulmonary disease) ([place])     - PFTs  01/24/06 FEV1 69% ratio 68% diffusing capacity 57% with no improvement after B2   - PFT's 10/1/ 09 FEV1 77   ratio  76    dlco                   45            no resp to B2    - Nl alpha one antitripsin level 10/09   Cough    Depression    Difficulty speaking    Disorder of nasal cavity    Dyspnea    Enthesopathy of elbow region    GERD (gastroesophageal reflux disease)    Hammer toe    Headache(784.0)    Hemiparesis affecting dominant side as late effect of cerebrovascular accident ([place])    History of kidney stones    Hyperlipidemia    Hypertension    Hypothyroidism    Increased urinary frequency    Low compliance bladder    Macular degeneration of both eyes    Malaise and fatigue    Nonruptured cerebral aneurysm    Obesity    Pneumonia    hx   Pure hypercholesterolemia    Recurrent major depressive episodes ([place])    Seizure disorder, secondary ([place])    2-3 focal siezures monthly   Seizures  ([place])    Short-term memory loss    since stroke   Skin sensation disturbance    Stroke ([place]) 2011   rt sided weakness   Swelling of limb    TMJ (temporomandibular joint disorder)    Urinary tract infectious disease    Vertigo as late effect of stroke    Vitamin D deficiency     Past Surgical History:  Procedure Laterality Date   BACK SURGERY     CEREBRAL ANGIOGRAM     x3   COLONOSCOPY WITH PROPOFOL N/A 08/18/2016   Procedure: COLONOSCOPY WITH PROPOFOL;  Surgeon: Carol Ada, MD;  Location: [facility];  Service: Endoscopy;  Laterality: N/A;   LAPAROSCOPIC HYSTERECTOMY     MANDIBLE FRACTURE SURGERY     RADIOLOGY WITH ANESTHESIA N/A 08/21/2013   Procedure: RADIOLOGY WITH ANESTHESIA;  Surgeon: Rob Hickman, MD;  Location: [facility];  Service: Radiology;  Laterality: N/A;   stones     no surgery    There were no vitals filed for this visit.   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 Yes    Pain Score 6     Pain Location Neck    Pain Orientation Right    Pain Descriptors / Indicators Aching    Pain Type Chronic pain    Pain Onset More than a month ago    Pain Frequency Intermittent    Aggravating Factors  nothing    Pain Relieving Factors stretching it             Pt using RUE to place checkers into Connect 4 (4 rows). Pt instructed on how to make it harder/more challenging by either raising higher or performing in hand manipulation (up to 3 checkers at a time).   Pt placing medium sized pegs into pegboard following design with 1 self corrected error. Pt then removing up to 4 at a time for in hand manipulation w/ mod difficulty and min drops.   Pt tracing over shapes and  uppercase letters w/ high lighter for coordination and control Rt hand for pre-writing ex's.                        OT Short Term Goals - 05/17/21 1245       OT SHORT TERM GOAL #1   Title Independent with coordination HEP and functional HEP for Rt hand/RUE    Time 4    Period Weeks    Status Achieved      OT SHORT TERM GOAL #2   Title Pt to verbalize understanding of safety considerations due to lack of sensation RUE    Time 4    Period Weeks    Status Achieved      OT SHORT TERM GOAL #3   Title Pt to improve RUE function as evidenced by performing 22 on Box & Blocks test    Baseline 18    Time 4    Period Weeks    Status New      OT SHORT TERM GOAL #4   Title Pt to feed self 25% with Rt dominant hand    Baseline all Lt hand    Time 4    Period Weeks    Status Achieved   50%     OT SHORT TERM GOAL #5   Title Pt to write name consistently with Rt hand    Baseline Lt hand    Time 4    Period Weeks    Status On-going   pt practiced name and address in print with built up pen at 100% legibility              OT Long Term Goals - 04/21/21 1217       OT LONG TERM GOAL #1   Title Pt independent with updated HEP to including weight bearing    Time 8    Period Weeks    Status New      OT LONG TERM GOAL #2   Title Improve coordination Rt hand as evidenced by performing 9 hole peg test in 65 sec or  less    Baseline 78.87 sec    Time 8    Period Weeks    Status New      OT LONG TERM GOAL #3   Title Pt to consistently feed self at 50% or greater with RUE    Time 8    Period Weeks    Status New      OT LONG TERM GOAL #4   Title Pt to groom self 25% or greater with RUE    Time 8    Period Weeks    Status New                   Plan - 05/18/21 1118     Clinical Impression Statement Pt progressing with RUE function/use and now eating 50% or more with Rt hand.    OT Occupational Profile and History Detailed Assessment- Review of  Records and additional review of physical, cognitive, psychosocial history related to current functional performance    Occupational performance deficits (Please refer to evaluation for details): ADL's;IADL's;Social Participation    Body Structure / Function / Physical Skills ADL;Decreased knowledge of use of DME;Strength;Dexterity;GMC;Proprioception;UE functional use;Edema;Body mechanics;IADL;Coordination;Mobility;FMC    Cognitive Skills Memory;Safety Awareness;Problem Solve;Attention    Rehab Potential Fair   time since onset, learned non use   Clinical Decision Making Several treatment options, min-mod task modification necessary    Comorbidities Affecting Occupational Performance: May have comorbidities impacting occupational performance    Modification or Assistance to Complete Evaluation  No modification of tasks or assist necessary to complete eval    OT Frequency 2x / week    OT Duration 8 weeks   plus eval (anticipate only 6 weeks needed)   OT Treatment/Interventions Self-care/ADL training;Fluidtherapy;DME and/or AE instruction;Splinting;Therapeutic activities;Therapeutic exercise;Cognitive remediation/compensation;Coping strategies training;Neuromuscular education;Functional Mobility Training;Passive range of motion;Visual/perceptual remediation/compensation;Patient/family education;Manual Therapy    Plan assess STG #3, proximal stability/strengthening (prone: scap retraction, prone on elbows, modified quadraped)    Consulted and Agree with Plan of Care Patient;Family member/caregiver    Family Member Consulted husband             Patient will benefit from skilled therapeutic intervention in order to improve the following deficits and impairments:   Body Structure / Function / Physical Skills: ADL, Decreased knowledge of use of DME, Strength, Dexterity, GMC, Proprioception, UE functional use, Edema, Body mechanics, IADL, Coordination, Mobility, FMC Cognitive Skills: Memory, Safety  Awareness, Problem Solve, Attention     Visit Diagnosis: Right spastic hemiparesis ([place])  Other lack of coordination    Problem List Patient Active Problem List   Diagnosis Date Noted   Family history of heart disease 06/14/2020   History of CVA (cerebrovascular accident) 06/14/2020   Exudative age-related macular degeneration of right eye with active choroidal neovascularization ([place]) 09/11/2019   Nocturnal hypoxemia 09/11/2019   Non-restorative sleep 06/13/2019   Benign myoclonus of drowsiness 06/13/2019   Excessive daytime sleepiness 06/13/2019   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   Hemiparesis affecting right side as late effect of cerebrovascular accident ([place]) 06/13/2019   Macular degeneration of right eye 06/11/2019   Class 1 obesity due to excess calories with serious comorbidity and body mass index (BMI) of 31.0 to 31.9 in adult 06/11/2019   Moderate episode of recurrent major depressive disorder ([place]) 06/11/2019   Spastic hemiparesis ([place]) 03/25/2019   Prediabetes 01/01/2019   Acquired hypothyroidism 01/01/2019   Hypertension 01/01/2019   Aortic atherosclerosis ([place]) 11/07/2018   History of embolic stroke without residual deficits  03/29/2018   Seizure disorder ([place]) 01/12/2017   Common migraine 01/12/2017   Aneurysm ([place])    Brain aneurysm 08/21/2013   Right foot drop 04/10/2013   Intracerebral hemorrhage ([place]) 03/19/2013   Convulsions ([place]) 03/19/2013   Headache 03/19/2013   Abnormality of gait 03/19/2013   Edema 02/23/2011   COUGH 06/10/2008   HYPERLIPIDEMIA 04/25/2008   TOBACCO ABUSE 04/25/2008   ASTHMA 04/25/2008   Chronic obstructive pulmonary disease ([place]) 04/25/2008    Carey Bullocks, OTR/L 05/18/2021, 11:27 AM  [facility] [address] [place], [place], [address] Phone: [phone]   Fax:  [phone]  Name: [name] MRN: [id number] Date of  Birth: 11-24-1955

## 2021-05-19 ENCOUNTER — Encounter: Payer: Self-pay | Admitting: Internal Medicine

## 2021-05-19 ENCOUNTER — Other Ambulatory Visit: Payer: Self-pay | Admitting: Internal Medicine

## 2021-05-19 DIAGNOSIS — E039 Hypothyroidism, unspecified: Secondary | ICD-10-CM

## 2021-05-20 ENCOUNTER — Other Ambulatory Visit: Payer: Self-pay

## 2021-05-20 DIAGNOSIS — E039 Hypothyroidism, unspecified: Secondary | ICD-10-CM

## 2021-05-20 MED ORDER — LEVOTHYROXINE SODIUM 75 MCG PO TABS: ORAL_TABLET | ORAL | 3 refills | Status: DC

## 2021-05-20 MED ORDER — LEVOTHYROXINE SODIUM 50 MCG PO TABS: ORAL_TABLET | ORAL | 3 refills | Status: DC

## 2021-05-24 ENCOUNTER — Ambulatory Visit: Payer: Medicare Other | Admitting: Occupational Therapy

## 2021-05-24 ENCOUNTER — Other Ambulatory Visit: Payer: Self-pay

## 2021-05-24 DIAGNOSIS — R278 Other lack of coordination: Secondary | ICD-10-CM

## 2021-05-24 DIAGNOSIS — R2681 Unsteadiness on feet: Secondary | ICD-10-CM

## 2021-05-24 DIAGNOSIS — G8111 Spastic hemiplegia affecting right dominant side: Secondary | ICD-10-CM

## 2021-05-24 DIAGNOSIS — R262 Difficulty in walking, not elsewhere classified: Secondary | ICD-10-CM | POA: Diagnosis not present

## 2021-05-24 DIAGNOSIS — R2689 Other abnormalities of gait and mobility: Secondary | ICD-10-CM | POA: Diagnosis not present

## 2021-05-24 DIAGNOSIS — M6281 Muscle weakness (generalized): Secondary | ICD-10-CM | POA: Diagnosis not present

## 2021-05-24 NOTE — Therapy (Signed)
[facility] [address] [place], [place], [address] Phone: [phone]   Fax:  [phone]  Occupational Therapy Treatment  Patient Details  Name: [name] MRN: [id number] Date of Birth: 1956-03-10 No data recorded  Encounter Date: 05/24/2021   OT End of Session - 05/24/21 1058     Visit Number 6    Number of Visits 17    Date for OT Re-Evaluation 06/21/21    Authorization Type MCR    Progress Note Due on Visit 10    OT Start Time 1017    OT Stop Time 1100    OT Time Calculation (min) 43 min    Activity Tolerance Patient tolerated treatment well    Behavior During Therapy WFL for tasks assessed/performed             Past Medical History:  Diagnosis Date   Abnormal gait    Amnesia    Anemia    hx   Aneurysm ([place])    Anxiety    Asthma    Atrophic vaginitis    Benign hypertensive heart disease    Cardiomegaly    Chest pain    COPD (chronic obstructive pulmonary disease) ([place])     - PFTs  01/24/06 FEV1 69% ratio 68% diffusing capacity 57% with no improvement after B2   - PFT's 10/1/ 09 FEV1 77   ratio  76    dlco                   45            no resp to B2    - Nl alpha one antitripsin level 10/09   Cough    Depression    Difficulty speaking    Disorder of nasal cavity    Dyspnea    Enthesopathy of elbow region    GERD (gastroesophageal reflux disease)    Hammer toe    Headache(784.0)    Hemiparesis affecting dominant side as late effect of cerebrovascular accident ([place])    History of kidney stones    Hyperlipidemia    Hypertension    Hypothyroidism    Increased urinary frequency    Low compliance bladder    Macular degeneration of both eyes    Malaise and fatigue    Nonruptured cerebral aneurysm    Obesity    Pneumonia    hx   Pure hypercholesterolemia    Recurrent major depressive episodes ([place])    Seizure disorder, secondary ([place])    2-3 focal siezures monthly   Seizures  ([place])    Short-term memory loss    since stroke   Skin sensation disturbance    Stroke ([place]) 2011   rt sided weakness   Swelling of limb    TMJ (temporomandibular joint disorder)    Urinary tract infectious disease    Vertigo as late effect of stroke    Vitamin D deficiency     Past Surgical History:  Procedure Laterality Date   BACK SURGERY     CEREBRAL ANGIOGRAM     x3   COLONOSCOPY WITH PROPOFOL N/A 08/18/2016   Procedure: COLONOSCOPY WITH PROPOFOL;  Surgeon: Carol Ada, MD;  [facility];  Service: Endoscopy;  Laterality: N/A;   LAPAROSCOPIC HYSTERECTOMY     MANDIBLE FRACTURE SURGERY     RADIOLOGY WITH ANESTHESIA N/A 08/21/2013   Procedure: RADIOLOGY WITH ANESTHESIA;  Surgeon: Rob Hickman, MD;  Location: [facility];  Service: Radiology;  Laterality: N/A;   stones     no surgery    There were no vitals filed for this visit.   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 Yes    Pain Score 4     Pain Location Neck    Pain Orientation Right    Pain Descriptors / Indicators Aching    Pain Type Chronic pain    Pain Onset More than a month ago    Pain Frequency Constant    Aggravating Factors  nothing    Pain Relieving Factors stretching             Pt issued HEP for neuro re-education - see pt instructions for details. Pt will benefit from review.  Attempted sh extension prone however pt compensates into sh hiking and sh IR and unable to control.   Box & Blocks RT = 24                     OT Education - 05/24/21 1051     Education Details scapula/sh girdle stabilization/strengthening HEP    Person(s) Educated Patient    Methods  Explanation;Demonstration;Handout;Verbal cues    Comprehension Verbalized understanding;Returned demonstration;Verbal cues required              OT Short Term Goals - 05/24/21 1058       OT SHORT TERM GOAL #1   Title Independent with coordination HEP and functional HEP for Rt hand/RUE    Time 4    Period Weeks    Status Achieved      OT SHORT TERM GOAL #2   Title Pt to verbalize understanding of safety considerations due to lack of sensation RUE    Time 4    Period Weeks    Status Achieved      OT SHORT TERM GOAL #3   Title Pt to improve RUE function as evidenced by performing 22 on Box & Blocks test    Baseline 18    Time 4    Period Weeks    Status Achieved   05/24/21: 24 blocks     OT SHORT TERM GOAL #4   Title Pt to feed self 25% with Rt dominant hand    Baseline all Lt hand    Time 4    Period Weeks    Status Achieved   50%     OT SHORT TERM GOAL #5   Title Pt to write name consistently with Rt hand    Baseline Lt hand    Time 4    Period Weeks    Status On-going   pt practiced name and address in print with built up pen at 100% legibility              OT Long Term Goals - 04/21/21 1217       OT LONG TERM GOAL #1   Title Pt independent with updated HEP to including weight bearing    Time 8    Period Weeks    Status New      OT LONG TERM GOAL #2   Title Improve coordination Rt hand as evidenced by performing 9 hole peg test in 65 sec or  less    Baseline 78.87 sec    Time 8    Period Weeks    Status New      OT LONG TERM GOAL #3   Title Pt to consistently feed self at 50% or greater with RUE    Time 8    Period Weeks    Status New      OT LONG TERM GOAL #4   Title Pt to groom self 25% or greater with RUE    Time 8    Period Weeks    Status New                   Plan - 05/24/21 1059     Clinical Impression Statement Pt met STG #3. Pt continues to make progress RUE    OT Occupational Profile and History Detailed Assessment-  Review of Records and additional review of physical, cognitive, psychosocial history related to current functional performance    Occupational performance deficits (Please refer to evaluation for details): ADL's;IADL's;Social Participation    Body Structure / Function / Physical Skills ADL;Decreased knowledge of use of DME;Strength;Dexterity;GMC;Proprioception;UE functional use;Edema;Body mechanics;IADL;Coordination;Mobility;FMC    Cognitive Skills Memory;Safety Awareness;Problem Solve;Attention    Rehab Potential Fair   time since onset, learned non use   Clinical Decision Making Several treatment options, min-mod task modification necessary    Comorbidities Affecting Occupational Performance: May have comorbidities impacting occupational performance    Modification or Assistance to Complete Evaluation  No modification of tasks or assist necessary to complete eval    OT Frequency 2x / week    OT Duration 8 weeks   plus eval (anticipate only 6 weeks needed)   OT Treatment/Interventions Self-care/ADL training;Fluidtherapy;DME and/or AE instruction;Splinting;Therapeutic activities;Therapeutic exercise;Cognitive remediation/compensation;Coping strategies training;Neuromuscular education;Functional Mobility Training;Passive range of motion;Visual/perceptual remediation/compensation;Patient/family education;Manual Therapy    Plan review HEP for scapula, continue coordination    Consulted and Agree with Plan of Care Patient;Family member/caregiver    Family Member Consulted husband             Patient will benefit from skilled therapeutic intervention in order to improve the following deficits and impairments:   Body Structure / Function / Physical Skills: ADL, Decreased knowledge of use of DME, Strength, Dexterity, GMC, Proprioception, UE functional use, Edema, Body mechanics, IADL, Coordination, Mobility, FMC Cognitive Skills: Memory, Safety Awareness, Problem Solve, Attention     Visit  Diagnosis: Right spastic hemiparesis ([facility])  Other lack of coordination  Unsteadiness on feet    Problem List Patient Active Problem List   Diagnosis Date Noted   Family history of heart disease 06/14/2020   History of CVA (cerebrovascular accident) 06/14/2020   Exudative age-related macular degeneration of right eye with active choroidal neovascularization ([place]) 09/11/2019   Nocturnal hypoxemia 09/11/2019   Non-restorative sleep 06/13/2019   Benign myoclonus of drowsiness 06/13/2019   Excessive daytime sleepiness 06/13/2019   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   Hemiparesis affecting right side as late effect of cerebrovascular accident ([place]) 06/13/2019   Macular degeneration of right eye 06/11/2019   Class 1 obesity due to excess calories with serious comorbidity and body mass index (BMI) of 31.0 to 31.9 in adult 06/11/2019   Moderate episode of recurrent major depressive disorder ([place]) 06/11/2019   Spastic hemiparesis ([place]) 03/25/2019   Prediabetes 01/01/2019   Acquired hypothyroidism 01/01/2019   Hypertension 01/01/2019   Aortic atherosclerosis ([place]) 11/07/2018   History of embolic stroke without residual deficits 03/29/2018   Seizure disorder ([place]) 01/12/2017  Common migraine 01/12/2017   Aneurysm ([place])    Brain aneurysm 08/21/2013   Right foot drop 04/10/2013   Intracerebral hemorrhage ([place]) 03/19/2013   Convulsions ([place]) 03/19/2013   Headache 03/19/2013   Abnormality of gait 03/19/2013   Edema 02/23/2011   COUGH 06/10/2008   HYPERLIPIDEMIA 04/25/2008   TOBACCO ABUSE 04/25/2008   ASTHMA 04/25/2008   Chronic obstructive pulmonary disease ([place]) 04/25/2008    Carey Bullocks, OTR/L 05/24/2021, [facility] [address] [place], [place], [address] Phone: [phone]   Fax:  [phone]  Name: [name] MRN: [id number] Date of Birth: March 21, 1956

## 2021-05-24 NOTE — Patient Instructions (Signed)
  Scapular Retraction (Prone)    Lie with arms at sides. Pinch shoulder blades together and down towards buttocks. Hold 5 sec Repeat _10___ times per set.  Do __2__ sessions per day.  Shoulder Push-Up (Prone on Elbows)    With elbows placed under shoulders, rise up on elbows lifting chest. Keep hips on surface and back arched. Hold __5__ seconds. Repeat __5__ times. Do __2__ sessions per day.   Healthy Back - Cat Stretch    With hands and knees apart, and looking down at floor, arch your back downward as much as possible, then pull stomach in to make back rounded. Use smooth, continuous movements, with no jerking or straining. Do __10__ times slowly. Keep elbows straight!. Do 2 sessions per day. Can modify to standing over low table with shelf liner under hands to keep from sliding.     All Fours Shoulder Flexion / Extension    Place hands and knees shoulder-width apart, rock back and sit on legs, then rock forward over hands and forearms. KEEP weight evenly on knees and hands Repeat _10___ times slowly. Do _2___ sessions per day. Can modify by standing over low sturdy table w/ shelf liner under hands to keep from sliding.

## 2021-05-26 ENCOUNTER — Ambulatory Visit: Payer: Medicare Other | Admitting: Rehabilitation

## 2021-05-26 ENCOUNTER — Encounter: Payer: Self-pay | Admitting: Rehabilitation

## 2021-05-26 ENCOUNTER — Other Ambulatory Visit: Payer: Self-pay

## 2021-05-26 ENCOUNTER — Ambulatory Visit: Payer: Medicare Other | Admitting: Occupational Therapy

## 2021-05-26 DIAGNOSIS — R278 Other lack of coordination: Secondary | ICD-10-CM | POA: Diagnosis not present

## 2021-05-26 DIAGNOSIS — M6281 Muscle weakness (generalized): Secondary | ICD-10-CM | POA: Diagnosis not present

## 2021-05-26 DIAGNOSIS — G8111 Spastic hemiplegia affecting right dominant side: Secondary | ICD-10-CM | POA: Diagnosis not present

## 2021-05-26 DIAGNOSIS — R262 Difficulty in walking, not elsewhere classified: Secondary | ICD-10-CM | POA: Diagnosis not present

## 2021-05-26 DIAGNOSIS — R2681 Unsteadiness on feet: Secondary | ICD-10-CM

## 2021-05-26 DIAGNOSIS — R2689 Other abnormalities of gait and mobility: Secondary | ICD-10-CM | POA: Diagnosis not present

## 2021-05-26 NOTE — Therapy (Signed)
[facility] [address] [place], [place], [address] Phone: [phone]   Fax:  [phone]  Physical Therapy Treatment and REcert  Patient Details  Name: [name] MRN: [id number] Date of Birth: 06/23/1956 Referring Provider (PT): Frann Rider, NP   Encounter Date: 05/26/2021   PT End of Session - 05/26/21 1352     Visit Number 12    Number of Visits 17    Date for PT Re-Evaluation 06/25/21    Authorization Type Medicare A + B; [employer]    Progress Note Due on Visit 10    PT Start Time [phone]    PT Stop Time 0930    PT Time Calculation (min) 44 min    Equipment Utilized During Treatment Gait belt    Activity Tolerance Patient tolerated treatment well    Behavior During Therapy WFL for tasks assessed/performed             Past Medical History:  Diagnosis Date   Abnormal gait    Amnesia    Anemia    hx   Aneurysm ([place])    Anxiety    Asthma    Atrophic vaginitis    Benign hypertensive heart disease    Cardiomegaly    Chest pain    COPD (chronic obstructive pulmonary disease) ([place])     - PFTs  01/24/06 FEV1 69% ratio 68% diffusing capacity 57% with no improvement after B2   - PFT's 10/1/ 09 FEV1 77   ratio  76    dlco                   45            no resp to B2    - Nl alpha one antitripsin level 10/09   Cough    Depression    Difficulty speaking    Disorder of nasal cavity    Dyspnea    Enthesopathy of elbow region    GERD (gastroesophageal reflux disease)    Hammer toe    Headache(784.0)    Hemiparesis affecting dominant side as late effect of cerebrovascular accident ([place])    History of kidney stones    Hyperlipidemia    Hypertension    Hypothyroidism    Increased urinary frequency    Low compliance bladder    Macular degeneration of both eyes    Malaise and fatigue    Nonruptured cerebral aneurysm    Obesity    Pneumonia    hx   Pure hypercholesterolemia    Recurrent major  depressive episodes ([place])    Seizure disorder, secondary ([place])    2-3 focal siezures monthly   Seizures ([place])    Short-term memory loss    since stroke   Skin sensation disturbance    Stroke ([place]) 2011   rt sided weakness   Swelling of limb    TMJ (temporomandibular joint disorder)    Urinary tract infectious disease    Vertigo as late effect of stroke    Vitamin D deficiency     Past Surgical History:  Procedure Laterality Date   BACK SURGERY     CEREBRAL ANGIOGRAM     x3   COLONOSCOPY WITH PROPOFOL N/A 08/18/2016   Procedure: COLONOSCOPY WITH PROPOFOL;  Surgeon: Carol Ada, MD;  Location: [facility];  Service: Endoscopy;  Laterality: N/A;   LAPAROSCOPIC HYSTERECTOMY     MANDIBLE FRACTURE SURGERY     RADIOLOGY WITH ANESTHESIA N/A 08/21/2013  Procedure: RADIOLOGY WITH ANESTHESIA;  Surgeon: Rob Hickman, MD;  Location: [facility];  Service: Radiology;  Laterality: N/A;   stones     no surgery    There were no vitals filed for this visit.   Subjective Assessment - 05/26/21 0849     Subjective Reports no changes since last visit, has some neck pain.    Pertinent History left frontoparietal parenchymal hemorrhage (2011), mild cognitive impairment, seizures, chronic headaches, cerebral aneurysm s/p stenting 2014, essential tremors, COPD and HTN, macular degeneration, Hypothyroidism, HLD, GERD, Hammer Toe, Depression, Cardiomegaly, Asthma, Anxiety and Anemia    Patient Stated Goals Improve Balance; Try to get toes in good position    Currently in Pain? Yes    Pain Score 4     Pain Location Neck    Pain Orientation Right    Pain Descriptors / Indicators Aching;Sore    Pain Type Chronic pain    Pain Onset More than a month ago    Pain Frequency Constant    Aggravating Factors  nothing    Pain Relieving Factors stretching                               [facility] Adult PT Treatment/Exercise - 05/26/21 0853       Transfers   Transfers Sit to  Stand;Stand to Sit    Sit to Stand 5: Supervision    Five time sit to stand comments  17.49 secs without UE support    Stand to Sit 5: Supervision    Comments Performed x 10 reps with RLE slightly further back than LLE without UE support for emphasis on RLE strengthening      Ambulation/Gait   Ambulation/Gait Yes    Ambulation/Gait Assistance 5: Supervision;4: Min guard    Ambulation/Gait Assistance Details S to min/guard throughout for gait with cane and ASO.  Did assess gait with ottobock walk on AFO for gait speed goal.  Note that gait speed did not greatly improve with AFO vs without ASO however she reports feeling better in brace and feels it helps with knee as well as she does demo intermittent knee hyperextension.    Ambulation Distance (Feet) 150 Feet    Assistive device Straight cane    Gait Pattern Step-through pattern;Decreased stance time - right;Decreased step length - left    Ambulation Surface Level;Indoor    Gait velocity 2.00 ft/sec with cane and ASO, 2.08 ft/sec with cane and AFO.      Berg Balance Test   Sit to Stand Able to stand without using hands and stabilize independently    Standing Unsupported Able to stand safely 2 minutes    Sitting with Back Unsupported but Feet Supported on Floor or Stool Able to sit safely and securely 2 minutes    Stand to Sit Sits safely with minimal use of hands    Transfers Able to transfer safely, minor use of hands    Standing Unsupported with Eyes Closed Able to stand 10 seconds with supervision    Standing Ubsupported with Feet Together Able to place feet together independently and stand for 1 minute with supervision    From Standing, Reach Forward with Outstretched Arm Can reach forward >12 cm safely (5")    From Standing Position, Pick up Object from Floor Able to pick up shoe, needs supervision    From Standing Position, Turn to Look Behind Over each Shoulder Looks behind from both sides  and weight shifts well    Turn 360 Degrees  Able to turn 360 degrees safely but slowly    Standing Unsupported, Alternately Place Feet on Step/Stool Able to complete >2 steps/needs minimal assist    Standing Unsupported, One Foot in Front Able to take small step independently and hold 30 seconds    Standing on One Leg Tries to lift leg/unable to hold 3 seconds but remains standing independently    Total Score 42                     PT Education - 05/26/21 1352     Education Details progress on LTGs, recert for 1x/wk for 4 more weeks    Person(s) Educated Patient    Methods Explanation    Comprehension Verbalized understanding              PT Short Term Goals - 04/28/21 1017       PT SHORT TERM GOAL #1   Title Patient will be IND with initial HEP for strength/balance (ALL STGS Due: 04/30/21)    Baseline 04/28/21 Pt reports compliance with current HEP. Denies any questions.    Time 3    Period Weeks    Status Achieved    Target Date 04/30/21      PT SHORT TERM GOAL #2   Title Patient will report understanding of fall prevention within the home to promote improved safety    Baseline Pt able to verbalize fall prevention strategies: turning on lights, removing rugs, keeping floors clear.    Time 3    Period Weeks    Status Achieved      PT SHORT TERM GOAL #3   Title Patient will improve 5x sit <> stand to </= 20 seconds to demo improved balance and strength    Baseline 22 seconds with UE support. 04/28/21 19 sec    Time 3    Period Weeks    Status Achieved      PT SHORT TERM GOAL #4   Title Patient will improve Berg Balance to >/= 35/56 to demo improved balance and reduced fall risk    Baseline 30/56, 04/28/21 39/56    Time 3    Period Weeks    Status Achieved               PT Long Term Goals - 05/26/21 6168       PT LONG TERM GOAL #1   Title Patient will be independent with final HEP for balance/strengthening (ALL LTGs Due: 05/21/21)    Baseline Pt reports doing well with HEP    Time 6     Period Weeks    Status Achieved      PT LONG TERM GOAL #2   Title Patient will improve Berg Balance to >/= 40/56 to demo improved balance and reduced fall risk    Baseline 42/56 on 05/26/21    Time 6    Period Weeks    Status Achieved      PT LONG TERM GOAL #3   Title Patient will improve 5x sit <> stand to </= 15 seconds to demo reduced fall risk    Baseline 17.49 secs without UE support    Time 6    Period Weeks    Status Partially Met      PT LONG TERM GOAL #4   Title Patient will improve gait speed to >/= 2.0 ft/sec with LRAD and potential AFO for improved community mobility  Baseline 1.41 ft/sec to 2.0 ft/sec with quad tip cane and ASO.    Time 6    Period Weeks    Status Achieved      PT LONG TERM GOAL #5   Title Patient will improve FOTO >/= 55%    Baseline Defer due to chronic CVA    Time 6    Period Weeks    Status Deferred               PT Long Term Goals - 05/26/21 1357       PT LONG TERM GOAL #1   Title Patient will be independent with final HEP for balance/strengthening (ALL LTGs Due: 06/25/21)    Baseline Pt reports doing well with HEP    Time 4    Period Weeks    Status On-going    Target Date 06/25/21      PT LONG TERM GOAL #2   Title Patient will improve Berg Balance to >/= 45/56 to demo improved balance and reduced fall risk    Baseline 42/56 on 05/26/21    Time 4    Period Weeks    Status Revised      PT LONG TERM GOAL #3   Title Patient will improve 5x sit <> stand to </= 15 seconds to demo reduced fall risk    Baseline 17.49 secs without UE support    Time 4    Period Weeks    Status On-going      PT LONG TERM GOAL #4   Title Patient will improve gait speed to >/= 2.4 ft/sec with LRAD and potential AFO for improved community mobility    Baseline 1.41 ft/sec to 2.0 ft/sec with quad tip cane and ASO.    Time 4    Period Weeks    Status Achieved      PT LONG TERM GOAL #5   Title Patient will improve FOTO >/= 55%    Baseline  Defer due to chronic CVA    Time 6    Period Weeks    Status Deferred                  Plan - 05/26/21 1353     Clinical Impression Statement Skilled session focused on assessment of LTGS.  Pt has met 3/5 LTGs, partially meeting 5TSS goal.  PT deferred FOTO goal as her CVA is chronic in nature.  Feel that pt would benefit from 1x/wk for 55moe weeks to meet remaiing goals and improve balance.    Personal Factors and Comorbidities Comorbidity 3+    Comorbidities left frontoparietal parenchymal hemorrhage (2011), mild cognitive impairment, seizures, chronic headaches, cerebral aneurysm s/p stenting 2014, essential tremors, COPD and HTN, macular degeneration, Hypothyroidism, HLD, GERD, Hammer Toe, Depression, Cardiomegaly, Asthma, Anxiety and Anemia    Examination-Activity Limitations Transfers;Locomotion Level;Bend;Dressing;Stand;Stairs;Reach Overhead    Examination-Participation Restrictions Cleaning;Meal Prep;Community Activity    Stability/Clinical Decision Making Stable/Uncomplicated    Rehab Potential Good    PT Frequency 2x / week    PT Duration 6 weeks    PT Treatment/Interventions ADLs/Self Care Home Management;Aquatic Therapy;Electrical Stimulation;Moist Heat;Cryotherapy;DME Instruction;Gait training;Stair training;Functional mobility training;Therapeutic activities;Therapeutic exercise;Balance training;Neuromuscular re-education;Patient/family education;Orthotic Fit/Training;Manual techniques;Taping;Vestibular;Passive range of motion;Dry needling    PT Next Visit Plan She did like walk on AFO during our session-see if she would like to pursue??  Continue with hip abductor, extensor and quad and hamstring strengthening. Continue with tall kneeling. Step-ups with less support, gait with cane on varied surfaces, balance  training. -- Posterior ottobock with lateral t strap worked best on AFO trials but continue to work with pt in her ASO to see if we can improve strength enough that AFO  may not be needed. She did bring her Bioness from home but had not worn in 2 years and was not charged. Has heel switch as well. May try in future but again feel strengthening first may be better option as weakness in knee and hip affecting her the most.    Consulted and Agree with Plan of Care Patient             Patient will benefit from skilled therapeutic intervention in order to improve the following deficits and impairments:  Abnormal gait, Decreased coordination, Decreased range of motion, Difficulty walking, Pain, Postural dysfunction, Decreased strength, Decreased balance, Decreased knowledge of use of DME, Impaired tone, Decreased safety awareness, Decreased endurance, Decreased activity tolerance  Visit Diagnosis: Unsteadiness on feet  Muscle weakness (generalized)  Other abnormalities of gait and mobility     Problem List Patient Active Problem List   Diagnosis Date Noted   Family history of heart disease 06/14/2020   History of CVA (cerebrovascular accident) 06/14/2020   Exudative age-related macular degeneration of right eye with active choroidal neovascularization ([place]) 09/11/2019   Nocturnal hypoxemia 09/11/2019   Non-restorative sleep 06/13/2019   Benign myoclonus of drowsiness 06/13/2019   Excessive daytime sleepiness 06/13/2019   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   Hemiparesis affecting right side as late effect of cerebrovascular accident ([place]) 06/13/2019   Macular degeneration of right eye 06/11/2019   Class 1 obesity due to excess calories with serious comorbidity and body mass index (BMI) of 31.0 to 31.9 in adult 06/11/2019   Moderate episode of recurrent major depressive disorder ([place]) 06/11/2019   Spastic hemiparesis ([place]) 03/25/2019   Prediabetes 01/01/2019   Acquired hypothyroidism 01/01/2019   Hypertension 01/01/2019   Aortic atherosclerosis ([place]) 11/07/2018   History of embolic stroke without residual deficits 03/29/2018    Seizure disorder ([place]) 01/12/2017   Common migraine 01/12/2017   Aneurysm ([place])    Brain aneurysm 08/21/2013   Right foot drop 04/10/2013   Intracerebral hemorrhage ([place]) 03/19/2013   Convulsions ([place]) 03/19/2013   Headache 03/19/2013   Abnormality of gait 03/19/2013   Edema 02/23/2011   COUGH 06/10/2008   HYPERLIPIDEMIA 04/25/2008   TOBACCO ABUSE 04/25/2008   ASTHMA 04/25/2008   Chronic obstructive pulmonary disease ([place]) 04/25/2008    Theia Dezeeuw, Betha Loa, PT Cameron Sprang, PT, MPT [facility] [address] [place], [place], [address] Phone: [phone]   Fax:  [phone] 05/26/21, 1:57 PM   Name: [name] MRN: [id number] Date of Birth: 17-Jul-1956

## 2021-05-26 NOTE — Therapy (Signed)
[facility] [address] [place], [place], [address] Phone: [phone]   Fax:  [phone]  Occupational Therapy Treatment  Patient Details  Name: [name] MRN: [id number] Date of Birth: 1956/03/12 No data recorded  Encounter Date: 05/26/2021   OT End of Session - 05/26/21 0946     Visit Number 7    Number of Visits 17    Date for OT Re-Evaluation 06/21/21    Authorization Type MCR    Progress Note Due on Visit 10    OT Start Time 0933    OT Stop Time 9892    OT Time Calculation (min) 42 min    Activity Tolerance Patient tolerated treatment well    Behavior During Therapy [facility] for tasks assessed/performed             Past Medical History:  Diagnosis Date   Abnormal gait    Amnesia    Anemia    hx   Aneurysm ([place])    Anxiety    Asthma    Atrophic vaginitis    Benign hypertensive heart disease    Cardiomegaly    Chest pain    COPD (chronic obstructive pulmonary disease) ([place])     - PFTs  01/24/06 FEV1 69% ratio 68% diffusing capacity 57% with no improvement after B2   - PFT's 10/1/ 09 FEV1 77   ratio  76    dlco                   45            no resp to B2    - Nl alpha one antitripsin level 10/09   Cough    Depression    Difficulty speaking    Disorder of nasal cavity    Dyspnea    Enthesopathy of elbow region    GERD (gastroesophageal reflux disease)    Hammer toe    Headache(784.0)    Hemiparesis affecting dominant side as late effect of cerebrovascular accident ([place])    History of kidney stones    Hyperlipidemia    Hypertension    Hypothyroidism    Increased urinary frequency    Low compliance bladder    Macular degeneration of both eyes    Malaise and fatigue    Nonruptured cerebral aneurysm    Obesity    Pneumonia    hx   Pure hypercholesterolemia    Recurrent major depressive episodes ([place])    Seizure disorder, secondary ([place])    2-3 focal siezures monthly   Seizures  ([facility])    Short-term memory loss    since stroke   Skin sensation disturbance    Stroke ([place]) 2011   rt sided weakness   Swelling of limb    TMJ (temporomandibular joint disorder)    Urinary tract infectious disease    Vertigo as late effect of stroke    Vitamin D deficiency     Past Surgical History:  Procedure Laterality Date   BACK SURGERY     CEREBRAL ANGIOGRAM     x3   COLONOSCOPY WITH PROPOFOL N/A 08/18/2016   Procedure: COLONOSCOPY WITH PROPOFOL;  Surgeon: Carol Ada, MD;  [facility];  Service: Endoscopy;  Laterality: N/A;   LAPAROSCOPIC HYSTERECTOMY     MANDIBLE FRACTURE SURGERY     RADIOLOGY WITH ANESTHESIA N/A 08/21/2013   Procedure: RADIOLOGY WITH ANESTHESIA;  Surgeon: Rob Hickman, MD;  Location: [facility];  Service: Radiology;  Laterality: N/A;   stones     no surgery    There were no vitals filed for this visit.   Subjective Assessment - 05/26/21 0938     Subjective  I've been more conscious of using my hand    Pertinent History Rt spastic hemiparesis from Lt frontoparietal hemorrhage 11/27/2009, cervical spinal stenosis, seizures, chronic headaches, essential tremors, COPD, HTN    Limitations h/o seizures, fall risk    Currently in Pain? Yes    Pain Score 4     Pain Location Neck    Pain Orientation Right    Pain Descriptors / Indicators Aching;Sore    Pain Type Chronic pain    Pain Onset More than a month ago    Pain Frequency Constant    Aggravating Factors  nothing    Pain Relieving Factors stretching                          OT Treatments/Exercises (OP) - 05/26/21 0001       Exercises   Exercises Hand;Shoulder      Shoulder Exercises: ROM/Strengthening   UBE (Upper Arm Bike) x 5 min for reciprocal movement pattern      Neurological Re-education Exercises   Other Weight-Bearing Exercises 1 Quadraped: cat/cow stretch, followed by A/P wt shifts w/ cues to keep stomach up and keep scapula stable w/ Anterior wt  shift. (Cat/cow has improved since last session)      Fine Motor Coordination (Hand/Wrist)   Fine Motor Coordination Grooved pegs    Grooved pegs w/ max assist and drops   Pt able to place medium sized pegs in pegboard Rt hand w/ mod difficulty and min drops, copying design at 100% accuracy                     OT Short Term Goals - 05/24/21 1058       OT SHORT TERM GOAL #1   Title Independent with coordination HEP and functional HEP for Rt hand/RUE    Time 4    Period Weeks    Status Achieved      OT SHORT TERM GOAL #2   Title Pt to verbalize understanding of safety considerations due to lack of sensation RUE    Time 4    Period Weeks    Status Achieved      OT SHORT TERM GOAL #3   Title Pt to improve RUE function as evidenced by performing 22 on Box & Blocks test    Baseline 18    Time 4    Period Weeks    Status Achieved   05/24/21: 24 blocks     OT SHORT TERM GOAL #4   Title Pt to feed self 25% with Rt dominant hand    Baseline all Lt hand    Time 4    Period Weeks    Status Achieved   50%     OT SHORT TERM GOAL #5   Title Pt to write name consistently with Rt hand    Baseline Lt hand    Time 4    Period Weeks    Status On-going   pt practiced name and address in print with built up pen at 100% legibility              OT Long Term Goals - 04/21/21 1217       OT LONG TERM GOAL #1   Title Pt independent  with updated HEP to including weight bearing    Time 8    Period Weeks    Status New      OT LONG TERM GOAL #2   Title Improve coordination Rt hand as evidenced by performing 9 hole peg test in 65 sec or less    Baseline 78.87 sec    Time 8    Period Weeks    Status New      OT LONG TERM GOAL #3   Title Pt to consistently feed self at 50% or greater with RUE    Time 8    Period Weeks    Status New      OT LONG TERM GOAL #4   Title Pt to groom self 25% or greater with RUE    Time 8    Period Weeks    Status New                    Plan - 05/26/21 5974     Clinical Impression Statement Pt continues to make progress RUE. Pt using Rt hand more    OT Occupational Profile and History Detailed Assessment- Review of Records and additional review of physical, cognitive, psychosocial history related to current functional performance    Occupational performance deficits (Please refer to evaluation for details): ADL's;IADL's;Social Participation    Body Structure / Function / Physical Skills ADL;Decreased knowledge of use of DME;Strength;Dexterity;GMC;Proprioception;UE functional use;Edema;Body mechanics;IADL;Coordination;Mobility;FMC    Cognitive Skills Memory;Safety Awareness;Problem Solve;Attention    Rehab Potential Fair   time since onset, learned non use   Clinical Decision Making Several treatment options, min-mod task modification necessary    Comorbidities Affecting Occupational Performance: May have comorbidities impacting occupational performance    Modification or Assistance to Complete Evaluation  No modification of tasks or assist necessary to complete eval    OT Frequency 2x / week    OT Duration 8 weeks   plus eval (anticipate only 6 weeks needed)   OT Treatment/Interventions Self-care/ADL training;Fluidtherapy;DME and/or AE instruction;Splinting;Therapeutic activities;Therapeutic exercise;Cognitive remediation/compensation;Coping strategies training;Neuromuscular education;Functional Mobility Training;Passive range of motion;Visual/perceptual remediation/compensation;Patient/family education;Manual Therapy    Plan continue functional use RUE, UBE    Consulted and Agree with Plan of Care Patient;Family member/caregiver    Family Member Consulted husband             Patient will benefit from skilled therapeutic intervention in order to improve the following deficits and impairments:   Body Structure / Function / Physical Skills: ADL, Decreased knowledge of use of DME, Strength, Dexterity, GMC,  Proprioception, UE functional use, Edema, Body mechanics, IADL, Coordination, Mobility, FMC Cognitive Skills: Memory, Safety Awareness, Problem Solve, Attention     Visit Diagnosis: Right spastic hemiparesis ([place])  Other lack of coordination    Problem List Patient Active Problem List   Diagnosis Date Noted   Family history of heart disease 06/14/2020   History of CVA (cerebrovascular accident) 06/14/2020   Exudative age-related macular degeneration of right eye with active choroidal neovascularization ([place]) 09/11/2019   Nocturnal hypoxemia 09/11/2019   Non-restorative sleep 06/13/2019   Benign myoclonus of drowsiness 06/13/2019   Excessive daytime sleepiness 06/13/2019   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   Hemiparesis affecting right side as late effect of cerebrovascular accident ([place]) 06/13/2019   Macular degeneration of right eye 06/11/2019   Class 1 obesity due to excess calories with serious comorbidity and body mass index (BMI) of 31.0 to 31.9 in adult 06/11/2019   Moderate episode of  recurrent major depressive disorder ([place]) 06/11/2019   Spastic hemiparesis ([place]) 03/25/2019   Prediabetes 01/01/2019   Acquired hypothyroidism 01/01/2019   Hypertension 01/01/2019   Aortic atherosclerosis ([place]) 11/07/2018   History of embolic stroke without residual deficits 03/29/2018   Seizure disorder ([place]) 01/12/2017   Common migraine 01/12/2017   Aneurysm ([place])    Brain aneurysm 08/21/2013   Right foot drop 04/10/2013   Intracerebral hemorrhage ([place]) 03/19/2013   Convulsions ([place]) 03/19/2013   Headache 03/19/2013   Abnormality of gait 03/19/2013   Edema 02/23/2011   COUGH 06/10/2008   HYPERLIPIDEMIA 04/25/2008   TOBACCO ABUSE 04/25/2008   ASTHMA 04/25/2008   Chronic obstructive pulmonary disease ([place]) 04/25/2008    Carey Bullocks, OTR/L 05/26/2021, 10:15 AM  [facility] [address] [place], [place], [address] Phone: [phone]   Fax:  [phone]  Name: ALEE GRESSMAN MRN: [id number] Date of Birth: [birth year]

## 2021-05-31 ENCOUNTER — Other Ambulatory Visit: Payer: Self-pay | Admitting: Internal Medicine

## 2021-05-31 DIAGNOSIS — Z1231 Encounter for screening mammogram for malignant neoplasm of breast: Secondary | ICD-10-CM

## 2021-06-01 DIAGNOSIS — G5602 Carpal tunnel syndrome, left upper limb: Secondary | ICD-10-CM | POA: Diagnosis not present

## 2021-06-01 DIAGNOSIS — G5622 Lesion of ulnar nerve, left upper limb: Secondary | ICD-10-CM | POA: Diagnosis not present

## 2021-06-02 DIAGNOSIS — I10 Essential (primary) hypertension: Secondary | ICD-10-CM | POA: Diagnosis not present

## 2021-06-02 DIAGNOSIS — Z6829 Body mass index (BMI) 29.0-29.9, adult: Secondary | ICD-10-CM | POA: Diagnosis not present

## 2021-06-02 DIAGNOSIS — G5602 Carpal tunnel syndrome, left upper limb: Secondary | ICD-10-CM | POA: Diagnosis not present

## 2021-06-10 ENCOUNTER — Ambulatory Visit: Payer: Medicare Other

## 2021-06-10 ENCOUNTER — Ambulatory Visit: Payer: Medicare Other | Admitting: Occupational Therapy

## 2021-06-14 ENCOUNTER — Ambulatory Visit: Payer: Medicare Other

## 2021-06-14 ENCOUNTER — Ambulatory Visit: Payer: Medicare Other | Admitting: Occupational Therapy

## 2021-06-16 ENCOUNTER — Other Ambulatory Visit: Payer: Self-pay | Admitting: Internal Medicine

## 2021-06-21 ENCOUNTER — Other Ambulatory Visit: Payer: Self-pay

## 2021-06-21 ENCOUNTER — Encounter: Payer: Self-pay | Admitting: Physical Therapy

## 2021-06-21 ENCOUNTER — Ambulatory Visit: Payer: Medicare Other | Attending: Internal Medicine | Admitting: Physical Therapy

## 2021-06-21 ENCOUNTER — Ambulatory Visit: Payer: Medicare Other | Admitting: Occupational Therapy

## 2021-06-21 DIAGNOSIS — R278 Other lack of coordination: Secondary | ICD-10-CM | POA: Insufficient documentation

## 2021-06-21 DIAGNOSIS — R2681 Unsteadiness on feet: Secondary | ICD-10-CM | POA: Insufficient documentation

## 2021-06-21 DIAGNOSIS — M6281 Muscle weakness (generalized): Secondary | ICD-10-CM | POA: Diagnosis not present

## 2021-06-21 DIAGNOSIS — R262 Difficulty in walking, not elsewhere classified: Secondary | ICD-10-CM | POA: Diagnosis not present

## 2021-06-21 DIAGNOSIS — G8111 Spastic hemiplegia affecting right dominant side: Secondary | ICD-10-CM | POA: Diagnosis not present

## 2021-06-21 DIAGNOSIS — R2689 Other abnormalities of gait and mobility: Secondary | ICD-10-CM | POA: Insufficient documentation

## 2021-06-21 NOTE — Therapy (Signed)
[facility] [address] [place], [place], [address] Phone: [phone]   Fax:  [phone]  Physical Therapy Treatment  Patient Details  Name: [name] MRN: [id number] Date of Birth: Jun 11, 1956 Referring Provider (PT): Frann Rider, NP   Encounter Date: 06/21/2021   PT End of Session - 06/21/21 1208     Visit Number 13    Number of Visits 17    Date for PT Re-Evaluation 06/25/21    Authorization Type Medicare A + B; [employer]    Progress Note Due on Visit 10    PT Start Time 1020    PT Stop Time 1103    PT Time Calculation (min) 43 min    Behavior During Therapy WFL for tasks assessed/performed             Past Medical History:  Diagnosis Date   Abnormal gait    Amnesia    Anemia    hx   Aneurysm ([place])    Anxiety    Asthma    Atrophic vaginitis    Benign hypertensive heart disease    Cardiomegaly    Chest pain    COPD (chronic obstructive pulmonary disease) ([place])     - PFTs  01/24/06 FEV1 69% ratio 68% diffusing capacity 57% with no improvement after B2   - PFT's 10/1/ 09 FEV1 77   ratio  76    dlco                   45            no resp to B2    - Nl alpha one antitripsin level 10/09   Cough    Depression    Difficulty speaking    Disorder of nasal cavity    Dyspnea    Enthesopathy of elbow region    GERD (gastroesophageal reflux disease)    Hammer toe    Headache(784.0)    Hemiparesis affecting dominant side as late effect of cerebrovascular accident ([place])    History of kidney stones    Hyperlipidemia    Hypertension    Hypothyroidism    Increased urinary frequency    Low compliance bladder    Macular degeneration of both eyes    Malaise and fatigue    Nonruptured cerebral aneurysm    Obesity    Pneumonia    hx   Pure hypercholesterolemia    Recurrent major depressive episodes ([place])    Seizure disorder, secondary ([place])    2-3 focal siezures monthly   Seizures ([place])     Short-term memory loss    since stroke   Skin sensation disturbance    Stroke [facility]) 2011   rt sided weakness   Swelling of limb    TMJ (temporomandibular joint disorder)    Urinary tract infectious disease    Vertigo as late effect of stroke    Vitamin D deficiency     Past Surgical History:  Procedure Laterality Date   BACK SURGERY     CEREBRAL ANGIOGRAM     x3   COLONOSCOPY WITH PROPOFOL N/A 08/18/2016   Procedure: COLONOSCOPY WITH PROPOFOL;  Surgeon: Carol Ada, MD;  Location: [facility];  Service: Endoscopy;  Laterality: N/A;   LAPAROSCOPIC HYSTERECTOMY     MANDIBLE FRACTURE SURGERY     RADIOLOGY WITH ANESTHESIA N/A 08/21/2013   Procedure: RADIOLOGY WITH ANESTHESIA;  Surgeon: Rob Hickman, MD;  Location: [facility];  Service: Radiology;  Laterality:  N/A;   stones     no surgery    There were no vitals filed for this visit.   Subjective Assessment - 06/21/21 1021     Subjective Pt reports having a fall since last visit. pt getting up from chair and not sure if her foot got caught on blanket. Pt able to get up ModI with external support. Reports no major injury.  Pt reports wanting to follow up with AFO recommended last treatment.    Currently in Pain? Yes    Pain Score 5     Pain Location Knee    Pain Orientation Right    Pain Descriptors / Indicators Aching    Pain Type Chronic pain   pt thinks that knee pain is coming from knee hyperextension when walking.   Pain Onset More than a month ago                               [facility] Adult PT Treatment/Exercise - 06/21/21 0001       Ambulation/Gait   Ambulation/Gait Yes    Ambulation/Gait Assistance 5: Supervision    Ambulation/Gait Assistance Details trialed same AFO set up as last visit with R Ottobock posterior leaf with lateral ankle supporting strap vs. R ASO with heel wedge.    Ambulation Distance (Feet) 200 Feet   +150   Assistive device Straight cane    Gait Pattern Step-through  pattern;Decreased stance time - right;Decreased step length - left;Right genu recurvatum   R genu recurvatum x1 when amb with ASO and heel wedge.   Ambulation Surface Level;Frankey Poot                     PT Education - 06/21/21 1156     Education Details Discussed pt's desire to get AFO set up with Ottobock posterior leaf and lateral strap for ankle instability; Pt reports that her R knee feels more stable.  Pt does have other AFO's, recommend pt bring to next visit and trial with heel wedge to help prevent R genu recurvatum.    Person(s) Educated Patient    Methods Explanation              PT Short Term Goals - 04/28/21 1017       PT SHORT TERM GOAL #1   Title Patient will be IND with initial HEP for strength/balance (ALL STGS Due: 04/30/21)    Baseline 04/28/21 Pt reports compliance with current HEP. Denies any questions.    Time 3    Period Weeks    Status Achieved    Target Date 04/30/21      PT SHORT TERM GOAL #2   Title Patient will report understanding of fall prevention within the home to promote improved safety    Baseline Pt able to verbalize fall prevention strategies: turning on lights, removing rugs, keeping floors clear.    Time 3    Period Weeks    Status Achieved      PT SHORT TERM GOAL #3   Title Patient will improve 5x sit <> stand to </= 20 seconds to demo improved balance and strength    Baseline 22 seconds with UE support. 04/28/21 19 sec    Time 3    Period Weeks    Status Achieved      PT SHORT TERM GOAL #4   Title Patient will improve Berg Balance to >/= 35/56 to demo improved balance  and reduced fall risk    Baseline 30/56, 04/28/21 39/56    Time 3    Period Weeks    Status Achieved               PT Long Term Goals - 05/26/21 1357       PT LONG TERM GOAL #1   Title Patient will be independent with final HEP for balance/strengthening (ALL LTGs Due: 06/25/21)    Baseline Pt reports doing well with HEP    Time 4    Period Weeks     Status On-going    Target Date 06/25/21      PT LONG TERM GOAL #2   Title Patient will improve Berg Balance to >/= 45/56 to demo improved balance and reduced fall risk    Baseline 42/56 on 05/26/21    Time 4    Period Weeks    Status Revised      PT LONG TERM GOAL #3   Title Patient will improve 5x sit <> stand to </= 15 seconds to demo reduced fall risk    Baseline 17.49 secs without UE support    Time 4    Period Weeks    Status On-going      PT LONG TERM GOAL #4   Title Patient will improve gait speed to >/= 2.4 ft/sec with LRAD and potential AFO for improved community mobility    Baseline 1.41 ft/sec to 2.0 ft/sec with quad tip cane and ASO.    Time 4    Period Weeks    Status Achieved      PT LONG TERM GOAL #5   Title Patient will improve FOTO >/= 55%    Baseline Defer due to chronic CVA    Time 6    Period Weeks    Status Deferred                   Plan - 06/21/21 1208     Clinical Impression Statement Pt continues to report and demonstrate R genu recurvatum intermittently with gait.  Pt reports noting more R knee pain attributing pain to genu recurvatum.  Pt would like to pursue getting R Ottobock posterior leaf AFO with lateral ankle strap to address R knee issue. Trailled R ASO with heel wedge and pt demonstrated R genu recurvatum at begining of walk x1.    Personal Factors and Comorbidities Comorbidity 3+    Comorbidities left frontoparietal parenchymal hemorrhage (2011), mild cognitive impairment, seizures, chronic headaches, cerebral aneurysm s/p stenting 2014, essential tremors, COPD and HTN, macular degeneration, Hypothyroidism, HLD, GERD, Hammer Toe, Depression, Cardiomegaly, Asthma, Anxiety and Anemia    Examination-Activity Limitations Transfers;Locomotion Level;Bend;Dressing;Stand;Stairs;Reach Overhead    Examination-Participation Restrictions Cleaning;Meal Prep;Community Activity    Stability/Clinical Decision Making Stable/Uncomplicated    Rehab  Potential Good    PT Frequency 2x / week    PT Duration 6 weeks    PT Treatment/Interventions ADLs/Self Care Home Management;Aquatic Therapy;Electrical Stimulation;Moist Heat;Cryotherapy;DME Instruction;Gait training;Stair training;Functional mobility training;Therapeutic activities;Therapeutic exercise;Balance training;Neuromuscular re-education;Patient/family education;Orthotic Fit/Training;Manual techniques;Taping;Vestibular;Passive range of motion;Dry needling    PT Next Visit Plan Discuss recommendation of new AFO set up vs. current ASO set up vs. using AFO pt already has with heel wedge?  Check goals D/C?    Consulted and Agree with Plan of Care Patient             Patient will benefit from skilled therapeutic intervention in order to improve the following deficits and impairments:  Abnormal gait,  Decreased coordination, Decreased range of motion, Difficulty walking, Pain, Postural dysfunction, Decreased strength, Decreased balance, Decreased knowledge of use of DME, Impaired tone, Decreased safety awareness, Decreased endurance, Decreased activity tolerance  Visit Diagnosis: Unsteadiness on feet  Muscle weakness (generalized)  Other abnormalities of gait and mobility     Problem List Patient Active Problem List   Diagnosis Date Noted   Family history of heart disease 06/14/2020   History of CVA (cerebrovascular accident) 06/14/2020   Exudative age-related macular degeneration of right eye with active choroidal neovascularization ([place]) 09/11/2019   Nocturnal hypoxemia 09/11/2019   Non-restorative sleep 06/13/2019   Benign myoclonus of drowsiness 06/13/2019   Excessive daytime sleepiness 06/13/2019   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   Hemiparesis affecting right side as late effect of cerebrovascular accident ([place]) 06/13/2019   Macular degeneration of right eye 06/11/2019   Class 1 obesity due to excess calories with serious comorbidity and body mass  index (BMI) of 31.0 to 31.9 in adult 06/11/2019   Moderate episode of recurrent major depressive disorder ([place]) 06/11/2019   Spastic hemiparesis ([place]) 03/25/2019   Prediabetes 01/01/2019   Acquired hypothyroidism 01/01/2019   Hypertension 01/01/2019   Aortic atherosclerosis ([place]) 11/07/2018   History of embolic stroke without residual deficits 03/29/2018   Seizure disorder ([place]) 01/12/2017   Common migraine 01/12/2017   Aneurysm ([place])    Brain aneurysm 08/21/2013   Right foot drop 04/10/2013   Intracerebral hemorrhage ([place]) 03/19/2013   Convulsions ([place]) 03/19/2013   Headache 03/19/2013   Abnormality of gait 03/19/2013   Edema 02/23/2011   COUGH 06/10/2008   HYPERLIPIDEMIA 04/25/2008   TOBACCO ABUSE 04/25/2008   ASTHMA 04/25/2008   Chronic obstructive pulmonary disease ([place]) 04/25/2008    Bjorn Loser, PTA  06/21/21, 12:17 PM   [facility] [address] [place], [place], [address] Phone: [phone]   Fax:  [phone]  Name: STEPHANINE REAS MRN: [id number] Date of Birth: [birth year]

## 2021-06-21 NOTE — Therapy (Signed)
[facility] [address] [place], [place], [address] Phone: [phone]   Fax:  [phone]  Occupational Therapy Treatment  Patient Details  Name: [name] MRN: [id number] Date of Birth: [birth year] No data recorded  Encounter Date: 06/21/2021   OT End of Session - 06/21/21 1008     Visit Number 8    Number of Visits 17    Date for OT Re-Evaluation 06/21/21    Authorization Type MCR    Progress Note Due on Visit 10    OT Start Time 0932    OT Stop Time 1015    OT Time Calculation (min) 43 min    Activity Tolerance Patient tolerated treatment well    Behavior During Therapy [facility] for tasks assessed/performed             Past Medical History:  Diagnosis Date   Abnormal gait    Amnesia    Anemia    hx   Aneurysm ([facility])    Anxiety    Asthma    Atrophic vaginitis    Benign hypertensive heart disease    Cardiomegaly    Chest pain    COPD (chronic obstructive pulmonary disease) ([facility])     - PFTs  01/24/06 FEV1 69% ratio 68% diffusing capacity 57% with no improvement after B2   - PFT's 10/1/ 09 FEV1 77   ratio  76    dlco                   45            no resp to B2    - Nl alpha one antitripsin level 10/09   Cough    Depression    Difficulty speaking    Disorder of nasal cavity    Dyspnea    Enthesopathy of elbow region    GERD (gastroesophageal reflux disease)    Hammer toe    Headache(784.0)    Hemiparesis affecting dominant side as late effect of cerebrovascular accident ([facility])    History of kidney stones    Hyperlipidemia    Hypertension    Hypothyroidism    Increased urinary frequency    Low compliance bladder    Macular degeneration of both eyes    Malaise and fatigue    Nonruptured cerebral aneurysm    Obesity    Pneumonia    hx   Pure hypercholesterolemia    Recurrent major depressive episodes ([facility])    Seizure disorder, secondary (HCC)    2-3 focal siezures monthly   Seizures  (HCC)    Short-term memory loss    since stroke   Skin sensation disturbance    Stroke ([facility]) 2011   rt sided weakness   Swelling of limb    TMJ (temporomandibular joint disorder)    Urinary tract infectious disease    Vertigo as late effect of stroke    Vitamin D deficiency     Past Surgical History:  Procedure Laterality Date   BACK SURGERY     CEREBRAL ANGIOGRAM     x3   COLONOSCOPY WITH PROPOFOL N/A 08/18/2016   Procedure: COLONOSCOPY WITH PROPOFOL;  Surgeon: Jeani Hawking, MD;  Location: [facility];  Service: Endoscopy;  Laterality: N/A;   LAPAROSCOPIC HYSTERECTOMY     MANDIBLE FRACTURE SURGERY     RADIOLOGY WITH ANESTHESIA N/A 08/21/2013   Procedure: RADIOLOGY WITH ANESTHESIA;  Surgeon: Oneal Grout, MD;  Location: [facility];  Service: Radiology;  Laterality: N/A;   stones     no surgery    There were no vitals filed for this visit.   Subjective Assessment - 06/21/21 0936     Subjective  I fell a few days ago, but nothing broken    Pertinent History Rt spastic hemiparesis from Lt frontoparietal hemorrhage 11/27/2009, cervical spinal stenosis, seizures, chronic headaches, essential tremors, COPD, HTN    Limitations h/o seizures, fall risk    Patient Stated Goals make things easier w/ Rt hand    Currently in Pain? Yes   Rt knee 5/10 - O.T. not addressing d/t outside scope of practice   Pain Onset More than a month ago            Reviewed and assessed remaining goals for d/c today per pt request (see below).  9 hole peg test Rt = 58 sec (improved by 20 sec from eval)  Verbally reviewed HEP's. Reviewed safety/fall prevention with standing activities.   Practiced placing checkers into Connect 4 RUE for coordination and functional reaching.   UBE x 8 min, level 1 for reciprocal movement pattern w/ min cues for hand/wrist positioning.                         OT Short Term Goals - 06/21/21 1009       OT SHORT TERM GOAL #1   Title  Independent with coordination HEP and functional HEP for Rt hand/RUE    Time 4    Period Weeks    Status Achieved      OT SHORT TERM GOAL #2   Title Pt to verbalize understanding of safety considerations due to lack of sensation RUE    Time 4    Period Weeks    Status Achieved      OT SHORT TERM GOAL #3   Title Pt to improve RUE function as evidenced by performing 22 on Box [employer] test    Baseline 18    Time 4    Period Weeks    Status Achieved   05/24/21: 24 blocks     OT SHORT TERM GOAL #4   Title Pt to feed self 25% with Rt dominant hand    Baseline all Lt hand    Time 4    Period Weeks    Status Achieved   50%     OT SHORT TERM GOAL #5   Title Pt to write name consistently with Rt hand    Baseline Lt hand    Time 4    Period Weeks    Status Partially Met   pt practiced name and address in print with built up pen at 100% legibility, however not always consistent              OT Long Term Goals - 06/21/21 1009       OT LONG TERM GOAL #1   Title Pt independent with updated HEP to including weight bearing    Time 8    Period Weeks    Status Achieved      OT LONG TERM GOAL #2   Title Improve coordination Rt hand as evidenced by performing 9 hole peg test in 65 sec or less    Baseline 78.87 sec    Time 8    Period Weeks    Status Achieved   58 sec     OT LONG TERM GOAL #3   Title Pt to consistently feed  self at 50% or greater with RUE    Time 8    Period Weeks    Status Achieved   approx 60-70%     OT LONG TERM GOAL #4   Title Pt to groom self 25% or greater with RUE    Time 8    Period Weeks    Status Achieved                   Plan - 06/21/21 1010     Clinical Impression Statement Pt has met all LTG's at this time. Pt does not wish to extend O.T. at this time due to personal reasons and insurance.    OT Occupational Profile and History Detailed Assessment- Review of Records and additional review of physical, cognitive, psychosocial  history related to current functional performance    Occupational performance deficits (Please refer to evaluation for details): ADL's;IADL's;Social Participation    Body Structure / Function / Physical Skills ADL;Decreased knowledge of use of DME;Strength;Dexterity;GMC;Proprioception;UE functional use;Edema;Body mechanics;IADL;Coordination;Mobility;FMC    Cognitive Skills Memory;Safety Awareness;Problem Solve;Attention    Rehab Potential Fair   time since onset, learned non use   Clinical Decision Making Several treatment options, min-mod task modification necessary    Comorbidities Affecting Occupational Performance: May have comorbidities impacting occupational performance    Modification or Assistance to Complete Evaluation  No modification of tasks or assist necessary to complete eval    OT Frequency 2x / week    OT Duration 8 weeks   plus eval (anticipate only 6 weeks needed)   OT Treatment/Interventions Self-care/ADL training;Fluidtherapy;DME and/or AE instruction;Splinting;Therapeutic activities;Therapeutic exercise;Cognitive remediation/compensation;Coping strategies training;Neuromuscular education;Functional Mobility Training;Passive range of motion;Visual/perceptual remediation/compensation;Patient/family education;Manual Therapy    Plan D/C O.T. per pt request    Consulted and Agree with Plan of Care Patient             Patient will benefit from skilled therapeutic intervention in order to improve the following deficits and impairments:   Body Structure / Function / Physical Skills: ADL, Decreased knowledge of use of DME, Strength, Dexterity, GMC, Proprioception, UE functional use, Edema, Body mechanics, IADL, Coordination, Mobility, FMC Cognitive Skills: Memory, Safety Awareness, Problem Solve, Attention     Visit Diagnosis: Right spastic hemiparesis (HCC)  Other lack of coordination  Unsteadiness on feet    Problem List Patient Active Problem List   Diagnosis Date  Noted   Family history of heart disease 06/14/2020   History of CVA (cerebrovascular accident) 06/14/2020   Exudative age-related macular degeneration of right eye with active choroidal neovascularization ([place]) 09/11/2019   Nocturnal hypoxemia 09/11/2019   Non-restorative sleep 06/13/2019   Benign myoclonus of drowsiness 06/13/2019   Excessive daytime sleepiness 06/13/2019   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   Hemiparesis affecting right side as late effect of cerebrovascular accident ([place]) 06/13/2019   Macular degeneration of right eye 06/11/2019   Class 1 obesity due to excess calories with serious comorbidity and body mass index (BMI) of 31.0 to 31.9 in adult 06/11/2019   Moderate episode of recurrent major depressive disorder ([place]) 06/11/2019   Spastic hemiparesis ([place]) 03/25/2019   Prediabetes 01/01/2019   Acquired hypothyroidism 01/01/2019   Hypertension 01/01/2019   Aortic atherosclerosis ([place]) 11/07/2018   History of embolic stroke without residual deficits 03/29/2018   Seizure disorder ([place]) 01/12/2017   Common migraine 01/12/2017   Aneurysm ([place])    Brain aneurysm 08/21/2013   Right foot drop 04/10/2013   Intracerebral hemorrhage ([place]) 03/19/2013   Convulsions ([place])  03/19/2013   Headache 03/19/2013   Abnormality of gait 03/19/2013   Edema 02/23/2011   COUGH 06/10/2008   HYPERLIPIDEMIA 04/25/2008   TOBACCO ABUSE 04/25/2008   ASTHMA 04/25/2008   Chronic obstructive pulmonary disease ([place]) 04/25/2008   OCCUPATIONAL THERAPY DISCHARGE SUMMARY  Visits from Start of Care: 8  Current functional level related to goals / functional outcomes: See above   Remaining deficits: Rt spastic hemiparesis Coordination Sensation   Education / Equipment: HEP's, safety re: balance during IADLS and due to lack of sensation Rt hand, A/E recommendations prn   Patient agrees to discharge. Patient goals were met. Patient is being discharged due to meeting the stated  rehab goals. And per pt request.     Carey Bullocks, OTR/L 06/21/2021, 12:24 PM  [facility] [address] [place], [place], [address] Phone: [phone]   Fax:  [phone]  Name: CASSIDIE VEIGA MRN: [id number] Date of Birth: 1955/10/14

## 2021-06-28 ENCOUNTER — Ambulatory Visit: Payer: Medicare Other

## 2021-06-28 ENCOUNTER — Telehealth: Payer: Self-pay

## 2021-06-28 ENCOUNTER — Other Ambulatory Visit: Payer: Self-pay

## 2021-06-28 DIAGNOSIS — R278 Other lack of coordination: Secondary | ICD-10-CM | POA: Diagnosis not present

## 2021-06-28 DIAGNOSIS — M6281 Muscle weakness (generalized): Secondary | ICD-10-CM

## 2021-06-28 DIAGNOSIS — R2681 Unsteadiness on feet: Secondary | ICD-10-CM

## 2021-06-28 DIAGNOSIS — G8111 Spastic hemiplegia affecting right dominant side: Secondary | ICD-10-CM

## 2021-06-28 DIAGNOSIS — R2689 Other abnormalities of gait and mobility: Secondary | ICD-10-CM

## 2021-06-28 DIAGNOSIS — R262 Difficulty in walking, not elsewhere classified: Secondary | ICD-10-CM

## 2021-06-28 NOTE — Telephone Encounter (Signed)
No problem. Order placed.  Please let me know if you need anything else.

## 2021-06-28 NOTE — Telephone Encounter (Signed)
Frann Rider, NP, Felisa Bonier. Pierotti is being treated by physical therapy for gait abnormality post CVA.  Patient will benefit from use of R AFO in order to improve safety with functional mobility.    If you agree, please submit request in EPIC under MD Order, Other Orders (list R AFO in comments) or fax to [facility] at [phone].   Thank you, Guillermina City, PT, [place] [address] [place], [place]  [address] Phone:  [phone] Fax:  [phone]

## 2021-06-28 NOTE — Therapy (Signed)
[facility] [address] [place], [place], [address] Phone: [phone]   Fax:  [phone]  Physical Therapy Treatment/Discharge Summary/Re-Cert for D/C Visit  Patient Details  Name: [name] MRN: [id number] Date of Birth: 01-18-1956 Referring Provider (PT): Ihor Austin, NP  PHYSICAL THERAPY DISCHARGE SUMMARY  Visits from Start of Care: 14  Current functional level related to goals / functional outcomes: See Clinical Impression Statement   Remaining deficits: None   Education / Equipment: HEP Provided   Patient agrees to discharge. Patient goals were partially met. Patient is being discharged due to meeting the stated rehab goals.   Encounter Date: 06/28/2021   PT End of Session - 06/28/21 0936     Visit Number 14    Number of Visits 17    Date for PT Re-Evaluation 06/29/21    Authorization Type Medicare A + B; [employer]    Progress Note Due on Visit 10    PT Start Time 0933    PT Stop Time 1010    PT Time Calculation (min) 37 min    Equipment Utilized During Treatment Gait belt    Activity Tolerance Patient tolerated treatment well    Behavior During Therapy WFL for tasks assessed/performed             Past Medical History:  Diagnosis Date   Abnormal gait    Amnesia    Anemia    hx   Aneurysm (HCC)    Anxiety    Asthma    Atrophic vaginitis    Benign hypertensive heart disease    Cardiomegaly    Chest pain    COPD (chronic obstructive pulmonary disease) (HCC)     - PFTs  01/24/06 FEV1 69% ratio 68% diffusing capacity 57% with no improvement after B2   - PFT's 10/1/ 09 FEV1 77   ratio  76    dlco                   45            no resp to B2    - Nl alpha one antitripsin level 10/09   Cough    Depression    Difficulty speaking    Disorder of nasal cavity    Dyspnea    Enthesopathy of elbow region    GERD (gastroesophageal reflux disease)    Hammer toe    Headache(784.0)     Hemiparesis affecting dominant side as late effect of cerebrovascular accident (HCC)    History of kidney stones    Hyperlipidemia    Hypertension    Hypothyroidism    Increased urinary frequency    Low compliance bladder    Macular degeneration of both eyes    Malaise and fatigue    Nonruptured cerebral aneurysm    Obesity    Pneumonia    hx   Pure hypercholesterolemia    Recurrent major depressive episodes (HCC)    Seizure disorder, secondary (HCC)    2-3 focal siezures monthly   Seizures (HCC)    Short-term memory loss    since stroke   Skin sensation disturbance    Stroke (HCC) 2011   rt sided weakness   Swelling of limb    TMJ (temporomandibular joint disorder)    Urinary tract infectious disease    Vertigo as late effect of stroke    Vitamin D deficiency     Past Surgical History:  Procedure Laterality Date  BACK SURGERY     CEREBRAL ANGIOGRAM     x3   COLONOSCOPY WITH PROPOFOL N/A 08/18/2016   Procedure: COLONOSCOPY WITH PROPOFOL;  Surgeon: Carol Ada, MD;  Location: [facility];  Service: Endoscopy;  Laterality: N/A;   LAPAROSCOPIC HYSTERECTOMY     MANDIBLE FRACTURE SURGERY     RADIOLOGY WITH ANESTHESIA N/A 08/21/2013   Procedure: RADIOLOGY WITH ANESTHESIA;  Surgeon: Rob Hickman, MD;  Location: [place];  Service: Radiology;  Laterality: N/A;   stones     no surgery    There were no vitals filed for this visit.   Subjective Assessment - 06/28/21 1051     Subjective Patient denies new changes. Did bring in her Anterior R AFO, reports does not like this brace. Would like to use the one that we have practiced with in prior sessions. No pain. No falls.    Pertinent History left frontoparietal parenchymal hemorrhage (2011), mild cognitive impairment, seizures, chronic headaches, cerebral aneurysm s/p stenting 2014, essential tremors, COPD and HTN, macular degeneration, Hypothyroidism, HLD, GERD, Hammer Toe, Depression, Cardiomegaly, Asthma, Anxiety and  Anemia    Limitations Standing;Walking    Patient Stated Goals Improve Balance; Try to get toes in good position    Currently in Pain? No/denies    Pain Onset More than a month ago                [facility] PT Assessment - 06/28/21 0001       Assessment   Medical Diagnosis Rt spastic hemiparesis    Referring Provider (PT) Frann Rider, NP              [facility] Adult PT Treatment/Exercise - 06/28/21 0001       Transfers   Transfers Sit to Stand;Stand to Sit    Sit to Stand 5: Supervision    Five time sit to stand comments  15.98 seconds without UE support; good control noted with brace donned    Stand to Sit 5: Supervision      Ambulation/Gait   Ambulation/Gait Yes    Ambulation/Gait Assistance 5: Supervision    Ambulation/Gait Assistance Details completed ambulation around therapy gym with R Ottobok Walk on donned x 230 ft and ASO. Improved stability and clearance noted with no recurvatum.    Ambulation Distance (Feet) 230 Feet    Assistive device Straight cane    Gait Pattern Step-through pattern;Decreased stance time - right;Decreased step length - left;Right genu recurvatum    Ambulation Surface Level;Indoor    Gait velocity 15.20 seconds = 2.15 ft/sec with AFO and SPC      Standardized Balance Assessment   Standardized Balance Assessment Berg Balance Test      Berg Balance Test   Sit to Stand Able to stand without using hands and stabilize independently    Standing Unsupported Able to stand safely 2 minutes    Sitting with Back Unsupported but Feet Supported on Floor or Stool Able to sit safely and securely 2 minutes    Stand to Sit Sits safely with minimal use of hands    Transfers Able to transfer safely, minor use of hands    Standing Unsupported with Eyes Closed Able to stand 10 seconds safely    Standing Ubsupported with Feet Together Able to place feet together independently and stand for 1 minute with supervision    From Standing, Reach Forward with  Outstretched Arm Can reach confidently >25 cm (10")    From Standing Position, Pick up Object from  Floor Able to pick up shoe, needs supervision    From Standing Position, Turn to Look Behind Over each Shoulder Looks behind from both sides and weight shifts well    Turn 360 Degrees Able to turn 360 degrees safely but slowly    Standing Unsupported, Alternately Place Feet on Step/Stool Able to complete 4 steps without aid or supervision    Standing Unsupported, One Foot in Front Able to take small step independently and hold 30 seconds    Standing on One Leg Tries to lift leg/unable to hold 3 seconds but remains standing independently    Total Score 45      Neuro Re-ed    Neuro Re-ed Details  Completed verbal review of HEP prior to d/c; educating on compliance.            HEP:  Access Code: [email or web address] URL: [email or web address] Date: 04/16/2021 Prepared by: Cherly Anderson   Exercises Supine Bridge - 1 x daily - 5 x weekly - 2 sets - 10 reps Clamshell - 1 x daily - 5 x weekly - 2 sets - 10 reps Prone Knee Flexion - 1 x daily - 5 x weekly - 2 sets - 10 reps Standing Knee Flexion AROM with Chair Support - 1 x daily - 5 x weekly - 2 sets - 10 reps Forward Step Up - 1 x daily - 5 x weekly - 2 sets - 10 reps         PT Education - 06/28/21 1053     Education Details Educated on HEP Compliance; Progress toward LTG. Plan to reach out to Neurology regarding AFO Order; [facility] information    Person(s) Educated Patient    Methods Explanation    Comprehension Verbalized understanding              PT Short Term Goals - 04/28/21 1017       PT SHORT TERM GOAL #1   Title Patient will be IND with initial HEP for strength/balance (ALL STGS Due: 04/30/21)    Baseline 04/28/21 Pt reports compliance with current HEP. Denies any questions.    Time 3    Period Weeks    Status Achieved    Target Date 04/30/21      PT SHORT TERM GOAL #2   Title Patient will report  understanding of fall prevention within the home to promote improved safety    Baseline Pt able to verbalize fall prevention strategies: turning on lights, removing rugs, keeping floors clear.    Time 3    Period Weeks    Status Achieved      PT SHORT TERM GOAL #3   Title Patient will improve 5x sit <> stand to </= 20 seconds to demo improved balance and strength    Baseline 22 seconds with UE support. 04/28/21 19 sec    Time 3    Period Weeks    Status Achieved      PT SHORT TERM GOAL #4   Title Patient will improve Berg Balance to >/= 35/56 to demo improved balance and reduced fall risk    Baseline 30/56, 04/28/21 39/56    Time 3    Period Weeks    Status Achieved               PT Long Term Goals - 06/28/21 0950       PT LONG TERM GOAL #1   Title Patient will be independent with final HEP for balance/strengthening (ALL LTGs Due: 06/25/21)  Baseline Pt reports doing well with HEP; Reports independence with HEP    Time 4    Period Weeks    Status Achieved      PT LONG TERM GOAL #2   Title Patient will improve Berg Balance to >/= 45/56 to demo improved balance and reduced fall risk    Baseline 42/56 on 05/26/21; 45/56 on 10/24    Time 4    Period Weeks    Status Achieved      PT LONG TERM GOAL #3   Title Patient will improve 5x sit <> stand to </= 15 seconds to demo reduced fall risk    Baseline 17.49 secs without UE support; 15.98 seconds without UE support    Time 4    Period Weeks    Status Partially Met      PT LONG TERM GOAL #4   Title Patient will improve gait speed to >/= 2.4 ft/sec with LRAD and potential AFO for improved community mobility    Baseline 1.41 ft/sec to 2.0 ft/sec with quad tip cane and ASO; 2.15 ft/sec with AFO with SPC    Time 4    Period Weeks    Status Partially Met      PT LONG TERM GOAL #5   Title Patient will improve FOTO >/= 55%    Baseline Defer due to chronic CVA    Time 6    Period Weeks    Status Deferred                    Plan - 06/28/21 1204     Clinical Impression Statement Completed assesment of patient's progress toward LTG. Patient able to partially meet or meet all LTG at this time with R Ottobok AFO. Patient demo reduced fall risk and balance with Berg score of 45/56. Patient also improved gait sped to 2.15 ft/sec with SPC with quad tip. Patient demo improved gait and no recurvatum with R Ottobok and would like to pursue getting AFO. Primary PT to reach out to referring provider regarding order. Patient agree to d/c from PT services today and will work directly with [facility] to obtain brace. PT in agreement.    Personal Factors and Comorbidities Comorbidity 3+    Comorbidities left frontoparietal parenchymal hemorrhage (2011), mild cognitive impairment, seizures, chronic headaches, cerebral aneurysm s/p stenting 2014, essential tremors, COPD and HTN, macular degeneration, Hypothyroidism, HLD, GERD, Hammer Toe, Depression, Cardiomegaly, Asthma, Anxiety and Anemia    Examination-Activity Limitations Transfers;Locomotion Level;Bend;Dressing;Stand;Stairs;Reach Overhead    Examination-Participation Restrictions Cleaning;Meal Prep;Community Activity    Stability/Clinical Decision Making Stable/Uncomplicated    Rehab Potential Good    PT Frequency 2x / week    PT Duration 6 weeks    PT Treatment/Interventions ADLs/Self Care Home Management;Aquatic Therapy;Electrical Stimulation;Moist Heat;Cryotherapy;DME Instruction;Gait training;Stair training;Functional mobility training;Therapeutic activities;Therapeutic exercise;Balance training;Neuromuscular re-education;Patient/family education;Orthotic Fit/Training;Manual techniques;Taping;Vestibular;Passive range of motion;Dry needling    Consulted and Agree with Plan of Care Patient             Patient will benefit from skilled therapeutic intervention in order to improve the following deficits and impairments:  Abnormal gait, Decreased coordination,  Decreased range of motion, Difficulty walking, Pain, Postural dysfunction, Decreased strength, Decreased balance, Decreased knowledge of use of DME, Impaired tone, Decreased safety awareness, Decreased endurance, Decreased activity tolerance  Visit Diagnosis: Other abnormalities of gait and mobility  Difficulty in walking, not elsewhere classified  Unsteadiness on feet  Muscle weakness (generalized)     Problem List Patient  Active Problem List   Diagnosis Date Noted   Family history of heart disease 06/14/2020   History of CVA (cerebrovascular accident) 06/14/2020   Exudative age-related macular degeneration of right eye with active choroidal neovascularization ([place]) 09/11/2019   Nocturnal hypoxemia 09/11/2019   Non-restorative sleep 06/13/2019   Benign myoclonus of drowsiness 06/13/2019   Excessive daytime sleepiness 06/13/2019   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   Hemiparesis affecting right side as late effect of cerebrovascular accident ([place]) 06/13/2019   Macular degeneration of right eye 06/11/2019   Class 1 obesity due to excess calories with serious comorbidity and body mass index (BMI) of 31.0 to 31.9 in adult 06/11/2019   Moderate episode of recurrent major depressive disorder ([place]) 06/11/2019   Spastic hemiparesis ([place]) 03/25/2019   Prediabetes 01/01/2019   Acquired hypothyroidism 01/01/2019   Hypertension 01/01/2019   Aortic atherosclerosis ([place]) 11/07/2018   History of embolic stroke without residual deficits 03/29/2018   Seizure disorder ([place]) 01/12/2017   Common migraine 01/12/2017   Aneurysm ([place])    Brain aneurysm 08/21/2013   Right foot drop 04/10/2013   Intracerebral hemorrhage ([place]) 03/19/2013   Convulsions ([place]) 03/19/2013   Headache 03/19/2013   Abnormality of gait 03/19/2013   Edema 02/23/2011   COUGH 06/10/2008   HYPERLIPIDEMIA 04/25/2008   TOBACCO ABUSE 04/25/2008   ASTHMA 04/25/2008   Chronic obstructive pulmonary disease  ([place]) 04/25/2008    Jones Bales, PT, DPT 06/28/2021, 12:13 PM  [facility] [address] [place], [place], [address] Phone: [phone]   Fax:  [phone]  Name: [name] MRN: [id number] Date of Birth: 25-Jul-1956

## 2021-06-30 ENCOUNTER — Ambulatory Visit
Admission: RE | Admit: 2021-06-30 | Discharge: 2021-06-30 | Disposition: A | Discharge status: Home / Self Care | Payer: Medicare Other | Source: Ambulatory Visit | Attending: Internal Medicine | Admitting: Internal Medicine

## 2021-06-30 ENCOUNTER — Other Ambulatory Visit: Payer: Self-pay

## 2021-06-30 DIAGNOSIS — Z1231 Encounter for screening mammogram for malignant neoplasm of breast: Secondary | ICD-10-CM

## 2021-07-14 DIAGNOSIS — M4802 Spinal stenosis, cervical region: Secondary | ICD-10-CM | POA: Diagnosis not present

## 2021-07-14 DIAGNOSIS — G5622 Lesion of ulnar nerve, left upper limb: Secondary | ICD-10-CM | POA: Diagnosis not present

## 2021-07-14 DIAGNOSIS — Z6829 Body mass index (BMI) 29.0-29.9, adult: Secondary | ICD-10-CM | POA: Diagnosis not present

## 2021-07-14 DIAGNOSIS — M5412 Radiculopathy, cervical region: Secondary | ICD-10-CM | POA: Diagnosis not present

## 2021-07-14 DIAGNOSIS — M542 Cervicalgia: Secondary | ICD-10-CM | POA: Diagnosis not present

## 2021-07-14 DIAGNOSIS — M5030 Other cervical disc degeneration, unspecified cervical region: Secondary | ICD-10-CM | POA: Diagnosis not present

## 2021-07-14 DIAGNOSIS — M47812 Spondylosis without myelopathy or radiculopathy, cervical region: Secondary | ICD-10-CM | POA: Diagnosis not present

## 2021-07-14 DIAGNOSIS — G5602 Carpal tunnel syndrome, left upper limb: Secondary | ICD-10-CM | POA: Diagnosis not present

## 2021-07-15 ENCOUNTER — Telehealth (HOSPITAL_BASED_OUTPATIENT_CLINIC_OR_DEPARTMENT_OTHER): Payer: Self-pay

## 2021-07-15 ENCOUNTER — Telehealth: Payer: Self-pay

## 2021-07-15 NOTE — Telephone Encounter (Signed)
   [facility] Pre-operative Risk Assessment    Patient Name: ITZAYANA PARDY  DOB: 1956-08-23 MRN: [id number]  HEARTCARE STAFF:  - IMPORTANT!!!!!! Under Visit Info/Reason for Call, type in Other and utilize the format Clearance MM/DD/YY or Clearance TBD. Do not use dashes or single digits. - Please review there is not already an duplicate clearance open for this procedure. - If request is for dental extraction, please clarify the # of teeth to be extracted. - If the patient is currently at the dentist's office, call Pre-Op Callback Staff (MA/nurse) to input urgent request.  - If the patient is not currently in the dentist office, please route to the Pre-Op pool.  Request for surgical clearance:  What type of surgery is being performed? Cervical Fusion  When is this surgery scheduled? TBD  What type of clearance is required (medical clearance vs. Pharmacy clearance to hold med vs. Both)  Both  Are there any medications that need to be held prior to surgery and how long? ASA, Plavix  Practice name and name of physician performing surgery? [facility]  What is the office phone number? [phone]   7.   What is the office fax number? [phone]  8.   Anesthesia type (None, local, MAC, general) ? General   Shella Maxim Denora Wysocki 07/15/2021, 2:24 PM  _________________________________________________________________   (provider comments below)

## 2021-07-15 NOTE — Telephone Encounter (Signed)
   Name: [name]  DOB: [birth year]  MRN: [id number]   Primary Cardiologist: Buford Dresser, MD  Chart reviewed as part of pre-operative protocol coverage. Patient was contacted 07/15/2021 in reference to pre-operative risk assessment for pending surgery as outlined below.  [name] was last seen on 05/05/21 by dr. Harrell Gave.  Since that day, [name] has done well.  Therefore, based on ACC/AHA guidelines, the patient would be at acceptable risk for the planned procedure without further cardiovascular testing.   Patient takes Plavix for stroke. Please obtain clearance from neurologist or PCP.   Okay to hold ASA for 5-7 days on cardiac stand point   [place], [place] 07/15/2021, 2:58 PM

## 2021-07-15 NOTE — Telephone Encounter (Signed)
Order for AFO brace has been sent to [facility]. Fax # [phone], confirmation received.

## 2021-07-19 ENCOUNTER — Ambulatory Visit: Payer: Medicare Other | Admitting: Internal Medicine

## 2021-07-19 ENCOUNTER — Telehealth: Payer: Self-pay

## 2021-07-19 NOTE — Telephone Encounter (Signed)
I called patient and left her a vm to call the office. I was wanting to notify her that she needs to call her hematologist to see when she is to stop the plavix prior to surgery. YL,RMA

## 2021-07-23 ENCOUNTER — Ambulatory Visit (HOSPITAL_BASED_OUTPATIENT_CLINIC_OR_DEPARTMENT_OTHER): Payer: Medicare Other | Admitting: Cardiology

## 2021-07-26 ENCOUNTER — Ambulatory Visit (INDEPENDENT_AMBULATORY_CARE_PROVIDER_SITE_OTHER): Payer: Medicare Other | Admitting: Internal Medicine

## 2021-07-26 ENCOUNTER — Encounter: Payer: Self-pay | Admitting: Internal Medicine

## 2021-07-26 ENCOUNTER — Other Ambulatory Visit (HOSPITAL_COMMUNITY)
Admission: RE | Admit: 2021-07-26 | Discharge: 2021-07-26 | Disposition: A | Discharge status: Home / Self Care | Payer: Medicare Other | Source: Ambulatory Visit | Attending: Internal Medicine | Admitting: Internal Medicine

## 2021-07-26 ENCOUNTER — Other Ambulatory Visit: Payer: Self-pay

## 2021-07-26 ENCOUNTER — Encounter: Payer: Self-pay | Admitting: Adult Health

## 2021-07-26 ENCOUNTER — Telehealth (HOSPITAL_COMMUNITY): Payer: Self-pay | Admitting: Radiology

## 2021-07-26 VITALS — BP 130/70 | HR 65 | Temp 98.3°F | Ht 62.0 in | Wt 160.2 lb

## 2021-07-26 DIAGNOSIS — I200 Unstable angina: Secondary | ICD-10-CM | POA: Diagnosis not present

## 2021-07-26 DIAGNOSIS — I119 Hypertensive heart disease without heart failure: Secondary | ICD-10-CM

## 2021-07-26 DIAGNOSIS — E663 Overweight: Secondary | ICD-10-CM | POA: Diagnosis not present

## 2021-07-26 DIAGNOSIS — F4321 Adjustment disorder with depressed mood: Secondary | ICD-10-CM

## 2021-07-26 DIAGNOSIS — Z6829 Body mass index (BMI) 29.0-29.9, adult: Secondary | ICD-10-CM

## 2021-07-26 DIAGNOSIS — R350 Frequency of micturition: Secondary | ICD-10-CM | POA: Diagnosis not present

## 2021-07-26 DIAGNOSIS — Z124 Encounter for screening for malignant neoplasm of cervix: Secondary | ICD-10-CM | POA: Diagnosis not present

## 2021-07-26 DIAGNOSIS — I700 Atherosclerosis of aorta: Secondary | ICD-10-CM | POA: Diagnosis not present

## 2021-07-26 DIAGNOSIS — M5412 Radiculopathy, cervical region: Secondary | ICD-10-CM

## 2021-07-26 DIAGNOSIS — Z1151 Encounter for screening for human papillomavirus (HPV): Secondary | ICD-10-CM | POA: Diagnosis not present

## 2021-07-26 DIAGNOSIS — Z01419 Encounter for gynecological examination (general) (routine) without abnormal findings: Secondary | ICD-10-CM | POA: Diagnosis not present

## 2021-07-26 DIAGNOSIS — Z79899 Other long term (current) drug therapy: Secondary | ICD-10-CM

## 2021-07-26 DIAGNOSIS — E039 Hypothyroidism, unspecified: Secondary | ICD-10-CM

## 2021-07-26 LAB — POCT URINALYSIS DIPSTICK
Bilirubin, UA: NEGATIVE
Blood, UA: NEGATIVE
Glucose, UA: NEGATIVE
Ketones, UA: NEGATIVE
Leukocytes, UA: NEGATIVE
Nitrite, UA: NEGATIVE
Protein, UA: NEGATIVE
Spec Grav, UA: 1.01 (ref 1.010–1.025)
Urobilinogen, UA: 0.2 E.U./dL
pH, UA: 6.5 (ref 5.0–8.0)

## 2021-07-26 NOTE — Patient Instructions (Signed)

## 2021-07-26 NOTE — Telephone Encounter (Signed)
Error

## 2021-07-26 NOTE — Telephone Encounter (Signed)
Patient called wanting to know if she can hold her Plavix for an upcoming neck surgery. Sent message to Jannifer Franklin - she will reach out to [facility]. I told the patient I would call her back as soon as I got the information from Dr. Estanislado Pandy. She agrees with this. JM

## 2021-07-26 NOTE — Progress Notes (Signed)
I,Tianna Badgett,acting as a Education administrator for [name], MD.,have documented all relevant documentation on the behalf of [name], MD,as directed by  [name], MD while in the presence of [name], MD.  This visit occurred during the SARS-CoV-2 public health emergency.  Safety protocols were in place, including screening questions prior to the visit, additional usage of staff PPE, and extensive cleaning of exam room while observing appropriate contact time as indicated for disinfecting solutions.  Subjective:     Patient ID: [name] , female    DOB: 1956-05-07 , 65 y.o.   MRN: [id number]   Chief Complaint  Patient presents with   Hypertension    HPI  She presents today for BP check.  She reports compliance with meds. She denies headaches, chest pain and shortness of breath.   Hypertension This is a chronic problem. The current episode started more than 1 year ago. The problem has been gradually improving since onset. The problem is controlled. Pertinent negatives include no blurred vision, chest pain, headaches, palpitations or shortness of breath. Risk factors for coronary artery disease include post-menopausal state and sedentary lifestyle. The current treatment provides moderate improvement. Compliance problems include exercise.     Past Medical History:  Diagnosis Date   Abnormal gait    Amnesia    Anemia    hx   Aneurysm (HCC)    Anxiety    Asthma    Atrophic vaginitis    Benign hypertensive heart disease    Cardiomegaly    Chest pain    COPD (chronic obstructive pulmonary disease) (HCC)     - PFTs  01/24/06 FEV1 69% ratio 68% diffusing capacity 57% with no improvement after B2   - PFT's 10/1/ 09 FEV1 77   ratio  76    dlco                   45            no resp to B2    - Nl alpha one antitripsin level 10/09   Cough    Depression    Difficulty speaking    Disorder of nasal cavity    Dyspnea    Enthesopathy of elbow region    GERD  (gastroesophageal reflux disease)    Hammer toe    Headache(784.0)    Hemiparesis affecting dominant side as late effect of cerebrovascular accident ([place])    History of kidney stones    Hyperlipidemia    Hypertension    Hypothyroidism    Increased urinary frequency    Low compliance bladder    Macular degeneration of both eyes    Malaise and fatigue    Nonruptured cerebral aneurysm    Obesity    Pneumonia    hx   Pure hypercholesterolemia    Recurrent major depressive episodes (HCC)    Seizure disorder, secondary ([place])    2-3 focal siezures monthly   Seizures ([place])    Short-term memory loss    since stroke   Skin sensation disturbance    Stroke [facility]) 2011   rt sided weakness   Swelling of limb    TMJ (temporomandibular joint disorder)    Urinary tract infectious disease    Vertigo as late effect of stroke    Vitamin D deficiency      Family History  Problem Relation Age of Onset   Heart disease Mother    Heart disease Father  Heart attack Father    Cancer Father        prostate to lymph node   Lung cancer Other    Breast cancer Neg Hx      Current Outpatient Medications:    albuterol (PROVENTIL) (2.5 MG/3ML) 0.083% nebulizer solution, Take 3 mLs (2.5 mg total) by nebulization every 6 (six) hours., Disp: 1080 mL, Rfl: 1   albuterol (VENTOLIN HFA) 108 (90 Base) MCG/ACT inhaler, Inhale 2 puffs into the lungs every 6 (six) hours as needed for wheezing or shortness of breath., Disp: 24 g, Rfl: 3   ALPRAZolam (XANAX) 0.25 MG tablet, TAKE 1 TABLET TWICE A DAY AS NEEDED, Disp: 60 tablet, Rfl: 2   aspirin EC 81 MG tablet, Take 81 mg by mouth daily., Disp: , Rfl:    Biotin 1 MG CAPS, Take by mouth., Disp: , Rfl:    Cholecalciferol (VITAMIN D-3) 1000 units CAPS, Take 1,000 Units by mouth daily., Disp: , Rfl:    citalopram (CELEXA) 40 MG tablet, TAKE 1 TABLET AT BEDTIME, Disp: 90 tablet, Rfl: 3   clopidogrel (PLAVIX) 75 MG tablet, TAKE 1 TABLET DAILY, Disp: 90 tablet, Rfl:  3   CRANBERRY PO, Take 2 tablets by mouth 2 (two) times daily., Disp: , Rfl:    cyclobenzaprine (FLEXERIL) 10 MG tablet, Take 1 tablet (10 mg total) by mouth 3 (three) times daily as needed., Disp: 90 tablet, Rfl: 5   diclofenac Sodium (VOLTAREN) 1 % GEL, Voltaren 1 % topical gel  APPLY 2 GRAM TO THE AFFECTED AREA(S) BY TOPICAL ROUTE 4 TIMES PER DAY, Disp: 350 g, Rfl: 2   fluticasone (FLONASE) 50 MCG/ACT nasal spray, Place 2 sprays into both nostrils daily as needed (allergies or post-nasl drip). , Disp: , Rfl: 3   levETIRAcetam (KEPPRA) 500 MG tablet, Take 1 tablet (500 mg total) by mouth every 12 (twelve) hours., Disp: 180 tablet, Rfl: 3   levothyroxine (SYNTHROID) 50 MCG tablet, TAKE 1 TABLET ON Saturday and Sunday, Disp: 39 tablet, Rfl: 3   levothyroxine (SYNTHROID) 75 MCG tablet, TAKE 1 TABLET IN THE MORNING BEFORE BREAKFAST ON Monday through Firday (TAKE 50 MCG ON OTHER DAYS), Disp: 51 tablet, Rfl: 3   Multiple Vitamins-Minerals (PRESERVISION/LUTEIN PO), Take by mouth. , Disp: , Rfl:    NON FORMULARY, preser vision 2 tabs daily, Disp: , Rfl:    NONFORMULARY OR COMPOUNDED ITEM, Apply 240 mg topically daily as needed., Disp: 1 each, Rfl: 3   Omega-3 Fatty Acids (FISH OIL) 1000 MG CAPS, Take 2,000 mg by mouth 2 (two) times daily. , Disp: , Rfl:    rosuvastatin (CRESTOR) 20 MG tablet, Take 1 tablet (20 mg total) by mouth daily., Disp: 90 tablet, Rfl: 3   telmisartan (MICARDIS) 20 MG tablet, TAKE 1 TABLET DAILY, Disp: 90 tablet, Rfl: 3   vitamin C (ASCORBIC ACID) 500 MG tablet, Take 500 mg by mouth daily., Disp: , Rfl:    vitamin E 1000 UNIT capsule, Take 1,000 Units by mouth at bedtime., Disp: , Rfl:    Allergies  Allergen Reactions   Phenytoin Anaphylaxis, Hives and Swelling     Review of Systems  Constitutional: Negative.   Eyes:  Negative for blurred vision.  Respiratory: Negative.  Negative for shortness of breath.   Cardiovascular: Negative.  Negative for chest pain and palpitations.   Gastrointestinal: Negative.   Neurological: Negative.  Negative for headaches.    Today's Vitals   07/26/21 0859  BP: 130/70  Pulse: 65  Temp: 98.3  F (36.8 C)  TempSrc: Oral  Weight: 160 lb 3.2 oz (72.7 kg)  Height: $Remove'5\' 2"'PGNWUGV$  (1.575 m)   Body mass index is 29.3 kg/m.  Wt Readings from Last 3 Encounters:  07/26/21 160 lb 3.2 oz (72.7 kg)  05/17/21 159 lb (72.1 kg)  05/05/21 160 lb 6.4 oz (72.8 kg)    Objective:  Physical Exam Vitals and nursing note reviewed. Exam conducted with a chaperone present.  Constitutional:      Appearance: Normal appearance.  HENT:     Head: Normocephalic and atraumatic.     Nose:     Comments: Masked     Mouth/Throat:     Comments: Masked  Cardiovascular:     Rate and Rhythm: Normal rate and regular rhythm.     Heart sounds: Normal heart sounds.  Pulmonary:     Effort: Pulmonary effort is normal.     Breath sounds: Normal breath sounds.  Genitourinary:    General: Normal vulva.     Exam position: Lithotomy position.     Tanner stage (genital): 5.     Labia:        Right: No lesion.        Left: No lesion.   Lymphadenopathy:     Lower Body: No right inguinal adenopathy. No left inguinal adenopathy.  Skin:    General: Skin is warm.  Neurological:     General: No focal deficit present.     Mental Status: She is alert.  Psychiatric:        Mood and Affect: Mood normal.        Behavior: Behavior normal.        Assessment And Plan:     1. Hypertensive heart disease without heart failure Comments: Chronic, controlled. Encouraged to follow low sodium diet. I will check renal function today. She will f/uin 4-6 months for re-evaluation.  - CMP14+EGFR - CBC - Lipid panel  2. Atherosclerosis of aorta (HCC) Comments: Chronic, importance of statin compliance was stressed to the patient. Encouraged to follow heart healthy lifestyle.  - Lipid panel  3. Primary hypothyroidism Comments: I will check thyroid panel and adjust meds as  needed.  - TSH + free T4  4. Cervical radiculopathy Comments: She states she is scheduled to have cervical spine surgery w/ N/S. She is awaiting clearance from Dr. Estanislado Pandy to come off of Plavix.   5. Urinary frequency Comments: She has recurrent UTI. I will check u/a today and treat accordingly.  - POCT Urinalysis Dipstick (81002)  6. Grief Comments: She declines grief counseling at this time. She is encouraged to let me know if she changes her mind.   7. Overweight with body mass index (BMI) of 29 to 29.9 in adult Comments: She is encouraged to increase her daily activity, including chair exercises. Advised to aim for at least 150 minutes of exercise per week.   8. Cervical smear, as part of routine gynecological examination Comments: Pap smear performed, stool heme negative.  - Cytology -Pap Smear  9. Drug therapy - Vitamin B12  Patient was given opportunity to ask questions. Patient verbalized understanding of the plan and was able to repeat key elements of the plan. All questions were answered to their satisfaction.   I, [name], MD, have reviewed all documentation for this visit. The documentation on 07/26/21 for the exam, diagnosis, procedures, and orders are all accurate and complete.   IF YOU HAVE BEEN REFERRED TO A SPECIALIST, IT MAY TAKE 1-2 WEEKS  TO SCHEDULE/PROCESS THE REFERRAL. IF YOU HAVE NOT HEARD FROM US/SPECIALIST IN TWO WEEKS, PLEASE GIVE [place] A CALL AT [phone] X [phone].   THE PATIENT IS ENCOURAGED TO PRACTICE SOCIAL DISTANCING DUE TO THE COVID-19 PANDEMIC.

## 2021-07-27 DIAGNOSIS — H353211 Exudative age-related macular degeneration, right eye, with active choroidal neovascularization: Secondary | ICD-10-CM | POA: Diagnosis not present

## 2021-07-27 DIAGNOSIS — H353122 Nonexudative age-related macular degeneration, left eye, intermediate dry stage: Secondary | ICD-10-CM | POA: Diagnosis not present

## 2021-07-27 LAB — CBC
Hematocrit: 40.6 % (ref 34.0–46.6)
Hemoglobin: 12.9 g/dL (ref 11.1–15.9)
MCH: 29.3 pg (ref 26.6–33.0)
MCHC: 31.8 g/dL (ref 31.5–35.7)
MCV: 92 fL (ref 79–97)
Platelets: 198 10*3/uL (ref 150–450)
RBC: 4.41 x10E6/uL (ref 3.77–5.28)
RDW: 13.4 % (ref 11.7–15.4)
WBC: 7.7 10*3/uL (ref 3.4–10.8)

## 2021-07-27 LAB — CMP14+EGFR
ALT: 23 IU/L (ref 0–32)
AST: 27 IU/L (ref 0–40)
Albumin/Globulin Ratio: 2 (ref 1.2–2.2)
Albumin: 4.4 g/dL (ref 3.8–4.8)
Alkaline Phosphatase: 107 IU/L (ref 44–121)
BUN/Creatinine Ratio: 19 (ref 12–28)
BUN: 12 mg/dL (ref 8–27)
Bilirubin Total: 0.7 mg/dL (ref 0.0–1.2)
CO2: 30 mmol/L — ABNORMAL HIGH (ref 20–29)
Calcium: 9.5 mg/dL (ref 8.7–10.3)
Chloride: 100 mmol/L (ref 96–106)
Creatinine, Ser: 0.64 mg/dL (ref 0.57–1.00)
Globulin, Total: 2.2 g/dL (ref 1.5–4.5)
Glucose: 88 mg/dL (ref 70–99)
Potassium: 4.6 mmol/L (ref 3.5–5.2)
Sodium: 141 mmol/L (ref 134–144)
Total Protein: 6.6 g/dL (ref 6.0–8.5)
eGFR: 99 mL/min/{1.73_m2} (ref >59)

## 2021-07-27 LAB — TSH+FREE T4
Free T4: 1.24 ng/dL (ref 0.82–1.77)
TSH: 1.83 u[IU]/mL (ref 0.450–4.500)

## 2021-07-27 LAB — VITAMIN B12: Vitamin B-12: 1421 pg/mL — ABNORMAL HIGH (ref 232–1245)

## 2021-07-27 LAB — LIPID PANEL
Chol/HDL Ratio: 2.1 ratio (ref 0.0–4.4)
Cholesterol, Total: 138 mg/dL (ref 100–199)
HDL: 67 mg/dL (ref >39)
LDL Chol Calc (NIH): 52 mg/dL (ref 0–99)
Triglycerides: 108 mg/dL (ref 0–149)
VLDL Cholesterol Cal: 19 mg/dL (ref 5–40)

## 2021-08-04 LAB — CYTOLOGY - PAP
Comment: NEGATIVE
Diagnosis: NEGATIVE
High risk HPV: NEGATIVE

## 2021-08-06 ENCOUNTER — Telehealth (HOSPITAL_COMMUNITY): Payer: Self-pay | Admitting: Radiology

## 2021-08-06 NOTE — Telephone Encounter (Signed)
Called pt to let her know that a form was faxed to Dr. Rubbie Battiest office with permission from [facility] to hold Plavix 75mg  x5 days prior to neck surgery with Dr. Reatha Armour. She is to resume the Plavix ASAP following her surgery. Pt agrees with this plan of care. JM

## 2021-08-10 ENCOUNTER — Other Ambulatory Visit: Payer: Self-pay | Admitting: Internal Medicine

## 2021-08-11 ENCOUNTER — Telehealth: Payer: Self-pay

## 2021-08-11 NOTE — Telephone Encounter (Signed)
she needs to call her hematologist to see when she is to stop the plavix prior to surgery. YL,RMA  the pt said that she doesn't have a blood doctor but that she will call dr Anette Guarneri office.

## 2021-08-16 ENCOUNTER — Other Ambulatory Visit: Payer: Self-pay | Admitting: Internal Medicine

## 2021-08-16 MED ORDER — ALPRAZOLAM 0.25 MG PO TABS: 0.2500 mg | ORAL_TABLET | Freq: Two times a day (BID) | ORAL | 2 refills | Status: DC | PRN

## 2021-08-24 ENCOUNTER — Other Ambulatory Visit: Payer: Self-pay | Admitting: Neurological Surgery

## 2021-09-01 NOTE — Progress Notes (Signed)
Surgical Instructions   Your procedure is scheduled on Thursday 09/09/2021.  Report to [facility] Main Entrance "A" at 09:50 A.M., then check in with the Admitting office.  Call [phone] if you have problems or questions between now and the morning of surgery:   Remember: Do not eat or drink after midnight the night before your surgery    Take these medicines the morning of surgery with A SIP OF WATER:  Levetiracetam (Keppra) Levothyroxine (Synthroid)   If needed you may take these medications the morning of surgery: Albuterol (Proventil) nebulizer Albuterol (Ventolin) inhaler - Please bring all inhalers with you the day of surgery.  Alprazolam (Xanax) Fluticasone (Flonase) Polyethyl Glycol-Propyl Glycol (Systane)   As of today, STOP taking any Diclofenac Sodium (Voltaren) gel, Aspirin (unless otherwise instructed by your surgeon) or Aspirin-containing products; NSAIDS - Aleve, Naproxen, Ibuprofen, Motrin, Advil, Goody's, BC's, all herbal medications, fish oil, and all vitamins.  Follow your surgeon's instructions on when to stop Aspirin.  If no instructions were given by your surgeon then you will need to call the office to get those instructions.  STOP your Clopidogrel (Plavix) on 09/04/2021    3 days leading up to your surgery OR after your pre-procedure COVID test  You are not required to quarantine however you are required to wear a well-fitting mask when you are out and around people not in your household.  If your mask becomes wet or soiled, replace with a new one.  Wash your hands often with soap and water for 20 seconds or clean your hands with an alcohol-based hand sanitizer that contains at least 60% alcohol.  Do not share personal items.  Notify your provider: if you are in close contact with someone who has COVID  or if you develop a fever of 100.4 or greater, sneezing, cough, sore throat, shortness of breath or body aches.          Do not wear jewelry or  makeup  Do not wear lotions, powders, perfumes/colognes, or deodorant.  Do not shave 48 hours prior to surgery.  Men may shave face and neck.  Do not wear nail polish, gel polish, artificial nails, or any other type of covering on natural nails including fingernails and toenails. If patients have artificial nails, gel coating, etc. that need to be removed by a nail salon please have this removed prior to surgery or surgery may need to be canceled/delayed if the surgeon/ anesthesia feels like the patient is unable to be adequately monitored.  Do not bring valuables to the hospital - [facility] is not responsible for any belongings or valuables.  Do NOT Smoke (Tobacco/Vaping) or drink Alcohol 24 hours prior to your procedure  If you use a CPAP at night, please bring your mask for your overnight stay.   Contacts, glasses, hearing aids, dentures or partials may not be worn into surgery, please bring cases for these belongings   For patients admitted to the hospital, discharge time will be determined by your treatment team.   Patients discharged the day of surgery will not be allowed to drive home, and someone needs to stay with them for 24 hours.  NO VISITORS WILL BE ALLOWED IN PRE-OP WHERE PATIENTS ARE PREPPED FOR SURGERY.  ONLY 1 SUPPORT PERSON MAY BE PRESENT IN THE WAITING ROOM WHILE YOU ARE IN SURGERY.  IF YOU ARE TO BE ADMITTED, ONCE YOU ARE IN YOUR ROOM YOU WILL BE ALLOWED TWO (2) VISITORS. 1 (ONE) VISITOR MAY STAY OVERNIGHT BUT  MUST ARRIVE TO THE ROOM BY 8pm.  Minor children may have two parents present. Special consideration for safety and communication needs will be reviewed on a case by case basis.  Special instructions:    Oral Hygiene is also important to reduce your risk of infection.  Remember - BRUSH YOUR TEETH THE MORNING OF SURGERY WITH YOUR REGULAR TOOTHPASTE   [place]- Preparing For Surgery  Before surgery, you can play an important role. Because skin is not sterile,  your skin needs to be as free of germs as possible. You can reduce the number of germs on your skin by washing with CHG (chlorahexidine gluconate) Soap before surgery.  CHG is an antiseptic cleaner which kills germs and bonds with the skin to continue killing germs even after washing.     Please do not use if you have an allergy to CHG or antibacterial soaps. If your skin becomes reddened/irritated stop using the CHG.  Do not shave (including legs and underarms) for at least 48 hours prior to first CHG shower. It is OK to shave your face.  Please follow these instructions carefully.     Shower the NIGHT BEFORE SURGERY and the MORNING OF SURGERY with CHG Soap.   If you chose to wash your hair, wash your hair first as usual with your normal shampoo. After you shampoo, rinse your hair and body thoroughly to remove the shampoo.    Then [employer] and genitals (private parts) with your normal soap and rinse thoroughly to remove soap.  Next use the CHG Soap as you would any other liquid soap. You can apply CHG directly to the skin and wash gently with a clean washcloth.   Apply the CHG Soap to your body ONLY FROM THE NECK DOWN.  Do not use on open wounds or open sores. Avoid contact with your eyes, ears, mouth and genitals (private parts). Wash Face and genitals (private parts)  with your normal soap.   Wash thoroughly, paying special attention to the area where your surgery will be performed.  Thoroughly rinse your body with warm water from the neck down.  DO NOT shower/wash with your normal soap after using and rinsing off the CHG Soap.  Pat yourself dry with a CLEAN TOWEL.  Wear CLEAN PAJAMAS to bed the night before surgery  Place CLEAN SHEETS on your bed the night before your surgery  DO NOT SLEEP WITH PETS.   Day of Surgery:  Take a shower with CHG soap. Wear Clean/Comfortable clothing the morning of surgery Do not apply any deodorants/lotions.   Remember to brush your teeth WITH  YOUR REGULAR TOOTHPASTE.   Please read over the fact sheets that you were given.

## 2021-09-02 ENCOUNTER — Encounter (HOSPITAL_COMMUNITY): Payer: Self-pay

## 2021-09-02 ENCOUNTER — Encounter (HOSPITAL_COMMUNITY)
Admission: RE | Admit: 2021-09-02 | Discharge: 2021-09-02 | Disposition: A | Discharge status: Home / Self Care | Payer: Medicare Other | Source: Ambulatory Visit | Attending: Neurological Surgery | Admitting: Neurological Surgery

## 2021-09-02 ENCOUNTER — Other Ambulatory Visit: Payer: Self-pay

## 2021-09-02 VITALS — BP 135/72 | HR 95 | Temp 97.8°F | Resp 18 | Ht 62.0 in | Wt 162.0 lb

## 2021-09-02 DIAGNOSIS — I700 Atherosclerosis of aorta: Secondary | ICD-10-CM | POA: Diagnosis not present

## 2021-09-02 DIAGNOSIS — Z01812 Encounter for preprocedural laboratory examination: Secondary | ICD-10-CM | POA: Diagnosis not present

## 2021-09-02 DIAGNOSIS — Z9981 Dependence on supplemental oxygen: Secondary | ICD-10-CM | POA: Insufficient documentation

## 2021-09-02 DIAGNOSIS — E785 Hyperlipidemia, unspecified: Secondary | ICD-10-CM | POA: Insufficient documentation

## 2021-09-02 DIAGNOSIS — I10 Essential (primary) hypertension: Secondary | ICD-10-CM | POA: Insufficient documentation

## 2021-09-02 DIAGNOSIS — J449 Chronic obstructive pulmonary disease, unspecified: Secondary | ICD-10-CM | POA: Diagnosis not present

## 2021-09-02 DIAGNOSIS — Z01818 Encounter for other preprocedural examination: Secondary | ICD-10-CM

## 2021-09-02 DIAGNOSIS — Z7902 Long term (current) use of antithrombotics/antiplatelets: Secondary | ICD-10-CM | POA: Diagnosis not present

## 2021-09-02 DIAGNOSIS — I2510 Atherosclerotic heart disease of native coronary artery without angina pectoris: Secondary | ICD-10-CM | POA: Insufficient documentation

## 2021-09-02 LAB — CBC
HCT: 39.7 % (ref 36.0–46.0)
Hemoglobin: 12.7 g/dL (ref 12.0–15.0)
MCH: 30 pg (ref 26.0–34.0)
MCHC: 32 g/dL (ref 30.0–36.0)
MCV: 93.9 fL (ref 80.0–100.0)
Platelets: 195 10*3/uL (ref 150–400)
RBC: 4.23 MIL/uL (ref 3.87–5.11)
RDW: 14.3 % (ref 11.5–15.5)
WBC: 7.1 10*3/uL (ref 4.0–10.5)
nRBC: 0 % (ref 0.0–0.2)

## 2021-09-02 LAB — BASIC METABOLIC PANEL
Anion gap: 8 (ref 5–15)
BUN: 14 mg/dL (ref 8–23)
CO2: 27 mmol/L (ref 22–32)
Calcium: 9.1 mg/dL (ref 8.9–10.3)
Chloride: 104 mmol/L (ref 98–111)
Creatinine, Ser: 0.55 mg/dL (ref 0.44–1.00)
GFR, Estimated: >60 mL/min (ref >60)
Glucose, Bld: 97 mg/dL (ref 70–99)
Potassium: 4.2 mmol/L (ref 3.5–5.1)
Sodium: 139 mmol/L (ref 135–145)

## 2021-09-02 LAB — TYPE AND SCREEN
ABO/RH(D): O POS
Antibody Screen: NEGATIVE

## 2021-09-02 LAB — SURGICAL PCR SCREEN
MRSA, PCR: NEGATIVE
Staphylococcus aureus: NEGATIVE

## 2021-09-02 NOTE — Progress Notes (Signed)
PCP - Dr. Glendale Chard Cardiologist - Dr. Buford Dresser Pulmonologist: Dr. Sherrilyn Rist Neuro: Dr. Pieter Partridge Dawley  PPM/ICD - Denies  Chest x-ray - N/A EKG - 03/24/21 Stress Test - 04/29/20 ECHO - 01/30/14 Cardiac Cath - Denies  Sleep Study - Yes, no OSA; Patient uses 2L of O2 at night  Diabetic: Denies  Blood Thinner Instructions: LD: Plavix - 09/03/21 Aspirin Instructions: Follow surgeon's instructions  ERAS Protcol - No  COVID TEST- Scheduled 09/07/21 at 0935   Anesthesia review: Yes, cardiac/neuro hx  Patient denies shortness of breath, fever, cough and chest pain at PAT appointment   All instructions explained to the patient, with a verbal understanding of the material. Patient agrees to go over the instructions while at home for a better understanding. Patient also instructed to self quarantine after being tested for COVID-19. The opportunity to ask questions was provided.

## 2021-09-03 NOTE — Progress Notes (Signed)
Anesthesia Chart Review:  Follows with cardiology for history of aortic atherosclerosis, HTN, HLD.  Nuclear stress 04/2020 low risk, nonischemic.  She was initially referred by PCP for evaluation and management of DOE.  Patient was last seen by Dr. Al Pimple on 05/05/2021.  At that time recent CCTA was reviewed which showed nonobstructive CAD.  She was recommended to continue secondary prevention and follow-up in 6 months.  Cardiac clearance per telephone encounter 07/15/2021, "Chart reviewed as part of pre-operative protocol coverage. Patient was contacted 07/15/2021 in reference to pre-operative risk assessment for pending surgery as outlined below.  Sully Manzi Wrightson was last seen on 05/05/21 by dr. Harrell Gave.  Since that day, [name] has done well. Therefore, based on ACC/AHA guidelines, the patient would be at acceptable risk for the planned procedure without further cardiovascular testing."  History of COPD on 2 L supplemental O2 nightly.  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  She is followed by neurology for history of seizures and is maintained on Keppra 500 mg twice daily.  She reports episodes of "eye fluttering" with forgetfulness lasting just a few seconds that occur 2-3 times per week.  Last seen 03/29/2021, no changes to management.  Recommended 33-month follow-up.  Continues to follow with Dr. Estanislado Pandy for history of cerebral aneurysm stenting and is maintained on Plavix.  Cleared to hold Plavix per telephone encounter 08/06/21, "Called pt to let her know that a form was faxed to Dr. Rubbie Battiest office with permission from [facility] to hold Plavix 75mg  x5 days prior to neck surgery with Dr. Reatha Armour. She is to resume the Plavix ASAP following her surgery."  Patient reported last  dose Plavix 09/03/2021.  Preop labs reviewed, WNL.  EKG 03/24/2021: NSR.  Rate 85.  PFT 10/15/2019: FVC-%Pred-Pre % 61  FEV1-%Pred-Pre % 58  FEV1FVC-%Pred-Pre % 93  TLC % pred % 92  RV % pred % 127  DLCO unc % pred % 69    Nuclear stress 04/29/2020: The left ventricular ejection fraction is hyperdynamic (>65%). Nuclear stress EF: 73%. The TID is increased at 1.34 but visually there does not appear to be transient ischemic dilatation. There was no ST segment deviation noted during stress. The perfusion study is normal. This is a low risk study.  TTE 01/30/2014: - Left ventricle: The cavity size was normal. Wall thickness was    normal. Systolic function was normal. The estimated ejection    fraction was in the range of 60% to 65%. Wall motion was normal;    there were no regional wall motion abnormalities. Doppler    parameters are consistent with abnormal left ventricular    relaxation (grade 1 diastolic dysfunction). The E/e&' ratio is <8,    suggesting normal LV filling pressure.  - Left atrium: The atrium was normal in size.  - Pericardium, extracardiac: There is a heterogenous mass inferior    to the RV which extends toward the apex and compresses the RV -    this is similar in appearance to liver and may represent    hepatomegaly or perhaps mass - consider dedicated    abdominal/thoracic imaging. There is the appearance of possible    mass invading the IVC in image 71. There was no pericardial    effusion.   Impressions:   - No evidence for cardiomegaly. Heterogenous mass comressing the RV  and possibly invading the IVC seen in subcostal images. Recommend    dedicated thoracoabdominal imaging.     Wynonia Musty [facility]/Anesthesiology Phone [phone] 09/03/2021 9:55 AM

## 2021-09-03 NOTE — Anesthesia Preprocedure Evaluation (Addendum)
Anesthesia Evaluation  Patient identified by MRN, date of birth, ID band Patient awake    Reviewed: Allergy & Precautions, NPO status , Patient's Chart, lab work & pertinent test results  History of Anesthesia Complications Negative for: history of anesthetic complications  Airway Mallampati: III  TM Distance: >3 FB Neck ROM: Full    Dental  (+) Dental Advisory Given, Teeth Intact,    Pulmonary former smoker,    breath sounds clear to auscultation       Cardiovascular hypertension, Pt. on medications (-) angina(-) Past MI and (-) CHF  Rhythm:Regular  Left ventricle: The cavity size was normal. Wall thickness was  normal. Systolic function was normal. The estimated ejection  fraction was in the range of 60% to 65%. Wall motion was normal;  there were no regional wall motion abnormalities. Doppler  parameters are consistent with abnormal left ventricular  relaxation (grade 1 diastolic dysfunction). The E/e&' ratio is <8,  suggesting normal LV filling pressure.  - Left atrium: The atrium was normal in size.  - Pericardium, extracardiac: There is a heterogenous mass inferior  to the RV which extends toward the apex and compresses the RV -  this is similar in appearance to liver and may represent  hepatomegaly or perhaps mass - consider dedicated  abdominal/thoracic imaging. There is the appearance of possible  mass invading the IVC in image 71. There was no pericardial  effusion.    2021:  ? The left ventricular ejection fraction is hyperdynamic (>65%). ? Nuclear stress EF: 73%. ? The TID is increased at 1.34 but visually there does not appear to be transient ischemic dilatation. ? There was no ST segment deviation noted during stress. ? The perfusion study is normal. ? This is a low risk study.    Neuro/Psych  Headaches, Seizures -, Well Controlled,  PSYCHIATRIC DISORDERS Anxiety Depression Cerebral  aneurysm, right sided weakness s/p cva  Neuromuscular disease CVA, Residual Symptoms    GI/Hepatic Neg liver ROS, GERD  Controlled,  Endo/Other  Hypothyroidism Lab Results      Component                Value               Date                      HGBA1C                   5.6                 09/16/2020             Renal/GU negative Renal ROSLab Results      Component                Value               Date                      CREATININE               0.55                09/02/2021                Musculoskeletal   Abdominal   Peds  Hematology  (+) Blood dyscrasia, , Lab Results      Component  Value               Date                      WBC                      7.1                 09/02/2021                HGB                      12.7                09/02/2021                HCT                      39.7                09/02/2021                MCV                      93.9                09/02/2021                PLT                      195                 09/02/2021              Anesthesia Other Findings   Reproductive/Obstetrics                            Anesthesia Physical Anesthesia Plan  ASA: 3  Anesthesia Plan: General   Post-op Pain Management: Ofirmev IV (intra-op)   Induction: Intravenous  PONV Risk Score and Plan: 3 and Ondansetron and Dexamethasone  Airway Management Planned: Oral ETT  Additional Equipment: None  Intra-op Plan:   Post-operative Plan: Extubation in OR  Informed Consent: I have reviewed the patients History and Physical, chart, labs and discussed the procedure including the risks, benefits and alternatives for the proposed anesthesia with the patient or authorized representative who has indicated his/her understanding and acceptance.     Dental advisory given  Plan Discussed with: CRNA and Anesthesiologist  Anesthesia Plan Comments: (PAT note by Karoline Caldwell, PA-C: Follows with  cardiology for history of aortic atherosclerosis, HTN, HLD.  Nuclear stress 04/2020 low risk, nonischemic.  She was initially referred by PCP for evaluation and management of DOE.  Patient was last seen by Dr. Al Pimple on 05/05/2021.  At that time recent CCTA was reviewed which showed nonobstructive CAD.  She was recommended to continue secondary prevention and follow-up in 6 months.  Cardiac clearance per telephone encounter 07/15/2021, "Chart reviewed as part of pre-operative protocol coverage. Patient was contacted11/10/2022in reference to pre-operative risk assessment for pending surgery as outlined below. Anihya S Listenbergerwas last seen on 8/31/22by dr. Harrell Gave. Since that day, Dalicia Kisner Listenbergerhas done well. Therefore, based on ACC/AHA guidelines, the patient would be at acceptable risk for the planned procedure without further cardiovascular testing."  History of COPD on 2 L supplemental O2  nightly.  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  She is followed by neurology for history of seizures and is maintained on Keppra 500 mg twice daily.  She reports episodes of "eye fluttering" with forgetfulness lasting just a few seconds that occur 2-3 times per week.  Last seen 03/29/2021, no changes to management.  Recommended 56-month follow-up.  Continues to follow with Dr. Estanislado Pandy for history of cerebral aneurysm stenting and is maintained on Plavix.  Cleared to hold Plavix per telephone encounter 08/06/21, "Called pt to let her know that a form was faxed to Dr. Rubbie Battiest office with permission from [facility] to hold Plavix 75mg  x5 days prior to neck surgery with Dr. Reatha Armour. She is to resume the Plavix ASAP following her surgery."  Patient reported last dose Plavix 09/03/2021.  Preop labs  reviewed, WNL.  EKG 03/24/2021: NSR.  Rate 85.  PFT 10/15/2019: FVC-%Pred-Pre % 61 FEV1-%Pred-Pre % 58 FEV1FVC-%Pred-Pre % 93 TLC % pred % 92 RV % pred % 127 DLCO unc % pred % 69   Nuclear stress 04/29/2020:  The left ventricular ejection fraction is hyperdynamic (>65%).  Nuclear stress EF: 73%.  The TID is increased at 1.34 but visually there does not appear to be transient ischemic dilatation.  There was no ST segment deviation noted during stress.  The perfusion study is normal.  This is a low risk study.  TTE 01/30/2014: - Left ventricle: The cavity size was normal. Wall thickness was  normal. Systolic function was normal. The estimated ejection  fraction was in the range of 60% to 65%. Wall motion was normal;  there were no regional wall motion abnormalities. Doppler  parameters are consistent with abnormal left ventricular  relaxation (grade 1 diastolic dysfunction). The E/e&' ratio is <8,  suggesting normal LV filling pressure.  - Left atrium: The atrium was normal in size.  - Pericardium, extracardiac: There is a heterogenous mass inferior  to the RV which extends toward the apex and compresses the RV -  this is similar in appearance to liver and may represent  hepatomegaly or perhaps mass - consider dedicated  abdominal/thoracic imaging. There is the appearance of possible  mass invading the IVC in image 71. There was no pericardial  effusion.   Impressions:   - No evidence for cardiomegaly. Heterogenous mass comressing the RV  and possibly invading the IVC seen in subcostal images. Recommend  dedicated thoracoabdominal imaging.   )       Anesthesia Quick Evaluation

## 2021-09-07 ENCOUNTER — Other Ambulatory Visit (HOSPITAL_COMMUNITY)
Admission: RE | Admit: 2021-09-07 | Discharge: 2021-09-07 | Disposition: A | Discharge status: Home / Self Care | Payer: Medicare Other | Source: Ambulatory Visit | Attending: Neurological Surgery | Admitting: Neurological Surgery

## 2021-09-07 ENCOUNTER — Other Ambulatory Visit: Payer: Self-pay | Admitting: Internal Medicine

## 2021-09-07 DIAGNOSIS — Z01812 Encounter for preprocedural laboratory examination: Secondary | ICD-10-CM | POA: Diagnosis not present

## 2021-09-07 DIAGNOSIS — Z20822 Contact with and (suspected) exposure to covid-19: Secondary | ICD-10-CM | POA: Insufficient documentation

## 2021-09-07 DIAGNOSIS — Z01818 Encounter for other preprocedural examination: Secondary | ICD-10-CM

## 2021-09-07 LAB — SARS CORONAVIRUS 2 (TAT 6-24 HRS): SARS Coronavirus 2: NEGATIVE

## 2021-09-09 ENCOUNTER — Ambulatory Visit (HOSPITAL_COMMUNITY): Payer: Medicare Other

## 2021-09-09 ENCOUNTER — Other Ambulatory Visit: Payer: Self-pay

## 2021-09-09 ENCOUNTER — Observation Stay (HOSPITAL_COMMUNITY)
Admission: RE | Admit: 2021-09-09 | Discharge: 2021-09-10 | Disposition: A | Discharge status: Home / Self Care | Payer: Medicare Other | Attending: Neurological Surgery | Admitting: Neurological Surgery

## 2021-09-09 ENCOUNTER — Encounter (HOSPITAL_COMMUNITY): Payer: Self-pay | Admitting: Neurological Surgery

## 2021-09-09 ENCOUNTER — Ambulatory Visit (HOSPITAL_COMMUNITY): Payer: Medicare Other | Admitting: Physician Assistant

## 2021-09-09 ENCOUNTER — Encounter (HOSPITAL_COMMUNITY)
Admission: RE | Disposition: A | Discharge status: Home / Self Care | Payer: Self-pay | Source: Home / Self Care | Attending: Neurological Surgery

## 2021-09-09 ENCOUNTER — Ambulatory Visit (HOSPITAL_COMMUNITY): Payer: Medicare Other | Admitting: Certified Registered Nurse Anesthetist

## 2021-09-09 DIAGNOSIS — E039 Hypothyroidism, unspecified: Secondary | ICD-10-CM | POA: Diagnosis not present

## 2021-09-09 DIAGNOSIS — G959 Disease of spinal cord, unspecified: Secondary | ICD-10-CM | POA: Diagnosis present

## 2021-09-09 DIAGNOSIS — Z87891 Personal history of nicotine dependence: Secondary | ICD-10-CM | POA: Diagnosis not present

## 2021-09-09 DIAGNOSIS — I10 Essential (primary) hypertension: Secondary | ICD-10-CM | POA: Diagnosis not present

## 2021-09-09 DIAGNOSIS — I119 Hypertensive heart disease without heart failure: Secondary | ICD-10-CM | POA: Diagnosis not present

## 2021-09-09 DIAGNOSIS — M4712 Other spondylosis with myelopathy, cervical region: Secondary | ICD-10-CM | POA: Diagnosis not present

## 2021-09-09 DIAGNOSIS — M4722 Other spondylosis with radiculopathy, cervical region: Secondary | ICD-10-CM | POA: Diagnosis not present

## 2021-09-09 DIAGNOSIS — Z79899 Other long term (current) drug therapy: Secondary | ICD-10-CM | POA: Insufficient documentation

## 2021-09-09 DIAGNOSIS — Z7982 Long term (current) use of aspirin: Secondary | ICD-10-CM | POA: Diagnosis not present

## 2021-09-09 DIAGNOSIS — Z419 Encounter for procedure for purposes other than remedying health state, unspecified: Secondary | ICD-10-CM

## 2021-09-09 DIAGNOSIS — M4802 Spinal stenosis, cervical region: Secondary | ICD-10-CM | POA: Insufficient documentation

## 2021-09-09 DIAGNOSIS — J449 Chronic obstructive pulmonary disease, unspecified: Secondary | ICD-10-CM | POA: Diagnosis not present

## 2021-09-09 DIAGNOSIS — G5602 Carpal tunnel syndrome, left upper limb: Secondary | ICD-10-CM | POA: Insufficient documentation

## 2021-09-09 DIAGNOSIS — J45909 Unspecified asthma, uncomplicated: Secondary | ICD-10-CM | POA: Diagnosis not present

## 2021-09-09 DIAGNOSIS — Z981 Arthrodesis status: Secondary | ICD-10-CM | POA: Diagnosis not present

## 2021-09-09 DIAGNOSIS — M5412 Radiculopathy, cervical region: Secondary | ICD-10-CM | POA: Diagnosis not present

## 2021-09-09 DIAGNOSIS — M4322 Fusion of spine, cervical region: Secondary | ICD-10-CM | POA: Diagnosis not present

## 2021-09-09 HISTORY — PX: ANTERIOR CERVICAL DECOMP/DISCECTOMY FUSION: SHX1161

## 2021-09-09 HISTORY — PX: CARPAL TUNNEL RELEASE: SHX101

## 2021-09-09 LAB — ABO/RH: ABO/RH(D): O POS

## 2021-09-09 SURGERY — ANTERIOR CERVICAL DECOMPRESSION/DISCECTOMY FUSION 2 LEVELS
Anesthesia: General

## 2021-09-09 MED ORDER — ACETAMINOPHEN 650 MG RE SUPP: 650.0000 mg | RECTAL | Status: DC | PRN

## 2021-09-09 MED ORDER — CHLORHEXIDINE GLUCONATE 4 % EX LIQD: 1.0000 "application " | Freq: Once | CUTANEOUS | Status: DC

## 2021-09-09 MED ORDER — ACETAMINOPHEN 10 MG/ML IV SOLN: 1000.0000 mg | Freq: Once | INTRAVENOUS | Status: DC | PRN

## 2021-09-09 MED ORDER — PROPOFOL 10 MG/ML IV BOLUS
INTRAVENOUS | Status: AC
  Filled 2021-09-09: qty 20

## 2021-09-09 MED ORDER — ACETAMINOPHEN 325 MG PO TABS
650.0000 mg | ORAL_TABLET | ORAL | Status: DC | PRN
  Administered 2021-09-09 – 2021-09-10 (×4): 650 mg via ORAL
  Filled 2021-09-09 (×4): qty 2

## 2021-09-09 MED ORDER — ROCURONIUM BROMIDE 10 MG/ML (PF) SYRINGE
PREFILLED_SYRINGE | INTRAVENOUS | Status: DC | PRN
  Administered 2021-09-09: 70 mg via INTRAVENOUS
  Administered 2021-09-09: 30 mg via INTRAVENOUS

## 2021-09-09 MED ORDER — CITALOPRAM HYDROBROMIDE 20 MG PO TABS
40.0000 mg | ORAL_TABLET | Freq: Every day | ORAL | Status: DC
  Administered 2021-09-09: 40 mg via ORAL
  Filled 2021-09-09: qty 2

## 2021-09-09 MED ORDER — THROMBIN 5000 UNITS EX SOLR
OROMUCOSAL | Status: DC | PRN
  Administered 2021-09-09: 5 mL via TOPICAL

## 2021-09-09 MED ORDER — ALPRAZOLAM 0.5 MG PO TABS: 0.2500 mg | ORAL_TABLET | Freq: Two times a day (BID) | ORAL | Status: DC | PRN

## 2021-09-09 MED ORDER — ACETAMINOPHEN 10 MG/ML IV SOLN
INTRAVENOUS | Status: DC | PRN
  Administered 2021-09-09: 1000 mg via INTRAVENOUS

## 2021-09-09 MED ORDER — ONDANSETRON HCL 4 MG PO TABS: 4.0000 mg | ORAL_TABLET | Freq: Four times a day (QID) | ORAL | Status: DC | PRN

## 2021-09-09 MED ORDER — SODIUM CHLORIDE 0.9 % IV SOLN
250.0000 mL | INTRAVENOUS | Status: DC
  Administered 2021-09-09: 250 mL via INTRAVENOUS

## 2021-09-09 MED ORDER — HYDROCODONE-ACETAMINOPHEN 7.5-325 MG PO TABS: 1.0000 | ORAL_TABLET | ORAL | Status: DC | PRN

## 2021-09-09 MED ORDER — LEVETIRACETAM 250 MG PO TABS
500.0000 mg | ORAL_TABLET | Freq: Two times a day (BID) | ORAL | Status: DC
  Administered 2021-09-09 – 2021-09-10 (×2): 500 mg via ORAL
  Filled 2021-09-09 (×2): qty 2

## 2021-09-09 MED ORDER — ONDANSETRON HCL 4 MG/2ML IJ SOLN
INTRAMUSCULAR | Status: DC | PRN
Start: 2021-09-09 — End: 2021-09-09
  Administered 2021-09-09: 4 mg via INTRAVENOUS

## 2021-09-09 MED ORDER — DOCUSATE SODIUM 100 MG PO CAPS
100.0000 mg | ORAL_CAPSULE | Freq: Two times a day (BID) | ORAL | Status: DC
  Administered 2021-09-09 – 2021-09-10 (×2): 100 mg via ORAL
  Filled 2021-09-09 (×2): qty 1

## 2021-09-09 MED ORDER — LACTATED RINGERS IV SOLN: INTRAVENOUS | Status: DC

## 2021-09-09 MED ORDER — ROSUVASTATIN CALCIUM 20 MG PO TABS
20.0000 mg | ORAL_TABLET | Freq: Every day | ORAL | Status: DC
  Administered 2021-09-09 – 2021-09-10 (×2): 20 mg via ORAL
  Filled 2021-09-09: qty 1

## 2021-09-09 MED ORDER — CEFAZOLIN SODIUM-DEXTROSE 2-4 GM/100ML-% IV SOLN
2.0000 g | INTRAVENOUS | Status: AC
  Administered 2021-09-09: 2 g via INTRAVENOUS
  Filled 2021-09-09: qty 100

## 2021-09-09 MED ORDER — SODIUM CHLORIDE 0.9% FLUSH
3.0000 mL | Freq: Two times a day (BID) | INTRAVENOUS | Status: DC
  Administered 2021-09-09: 3 mL via INTRAVENOUS

## 2021-09-09 MED ORDER — TRIAMCINOLONE ACETONIDE 40 MG/ML IJ SUSP
INTRAMUSCULAR | Status: AC
  Filled 2021-09-09: qty 5

## 2021-09-09 MED ORDER — DEXAMETHASONE SODIUM PHOSPHATE 10 MG/ML IJ SOLN
INTRAMUSCULAR | Status: DC | PRN
  Administered 2021-09-09: 10 mg via INTRAVENOUS

## 2021-09-09 MED ORDER — EPHEDRINE SULFATE-NACL 50-0.9 MG/10ML-% IV SOSY: PREFILLED_SYRINGE | INTRAVENOUS | Status: DC | PRN

## 2021-09-09 MED ORDER — ACETAMINOPHEN 500 MG PO TABS: 1000.0000 mg | ORAL_TABLET | Freq: Once | ORAL | Status: DC | PRN

## 2021-09-09 MED ORDER — ORAL CARE MOUTH RINSE: 15.0000 mL | Freq: Once | OROMUCOSAL | Status: AC

## 2021-09-09 MED ORDER — PHENYLEPHRINE HCL-NACL 20-0.9 MG/250ML-% IV SOLN
INTRAVENOUS | Status: DC | PRN
  Administered 2021-09-09: 25 ug/min via INTRAVENOUS

## 2021-09-09 MED ORDER — DEXTROSE 5 % IV SOLN
500.0000 mg | Freq: Four times a day (QID) | INTRAVENOUS | Status: DC | PRN
  Filled 2021-09-09: qty 5

## 2021-09-09 MED ORDER — CEFAZOLIN SODIUM-DEXTROSE 2-4 GM/100ML-% IV SOLN
2.0000 g | Freq: Three times a day (TID) | INTRAVENOUS | Status: AC
  Administered 2021-09-09 (×2): 2 g via INTRAVENOUS
  Filled 2021-09-09 (×3): qty 100

## 2021-09-09 MED ORDER — FENTANYL CITRATE (PF) 100 MCG/2ML IJ SOLN
INTRAMUSCULAR | Status: AC
  Filled 2021-09-09: qty 2

## 2021-09-09 MED ORDER — BUPIVACAINE-EPINEPHRINE (PF) 0.25% -1:200000 IJ SOLN
INTRAMUSCULAR | Status: DC | PRN
  Administered 2021-09-09: 3.4 mL via PERINEURAL

## 2021-09-09 MED ORDER — FENTANYL CITRATE (PF) 250 MCG/5ML IJ SOLN
INTRAMUSCULAR | Status: DC | PRN
  Administered 2021-09-09 (×2): 100 ug via INTRAVENOUS
  Administered 2021-09-09: 50 ug via INTRAVENOUS

## 2021-09-09 MED ORDER — FENTANYL CITRATE (PF) 250 MCG/5ML IJ SOLN
INTRAMUSCULAR | Status: AC
  Filled 2021-09-09: qty 5

## 2021-09-09 MED ORDER — LIDOCAINE-EPINEPHRINE 1 %-1:100000 IJ SOLN
INTRAMUSCULAR | Status: AC
  Filled 2021-09-09: qty 1

## 2021-09-09 MED ORDER — CHLORHEXIDINE GLUCONATE 0.12 % MT SOLN
15.0000 mL | Freq: Once | OROMUCOSAL | Status: AC
  Administered 2021-09-09: 15 mL via OROMUCOSAL
  Filled 2021-09-09: qty 15

## 2021-09-09 MED ORDER — OXYCODONE HCL 5 MG/5ML PO SOLN: 5.0000 mg | Freq: Once | ORAL | Status: DC | PRN

## 2021-09-09 MED ORDER — MIDAZOLAM HCL 5 MG/5ML IJ SOLN
INTRAMUSCULAR | Status: DC | PRN
  Administered 2021-09-09: 2 mg via INTRAVENOUS

## 2021-09-09 MED ORDER — MENTHOL 3 MG MT LOZG: 1.0000 | LOZENGE | OROMUCOSAL | Status: DC | PRN

## 2021-09-09 MED ORDER — IRBESARTAN 150 MG PO TABS
75.0000 mg | ORAL_TABLET | Freq: Every day | ORAL | Status: DC
  Administered 2021-09-10: 75 mg via ORAL
  Filled 2021-09-09: qty 1

## 2021-09-09 MED ORDER — LIDOCAINE-EPINEPHRINE 1 %-1:100000 IJ SOLN
INTRAMUSCULAR | Status: DC | PRN
  Administered 2021-09-09: 3.5 mL

## 2021-09-09 MED ORDER — DEXAMETHASONE SODIUM PHOSPHATE 10 MG/ML IJ SOLN
INTRAMUSCULAR | Status: AC
  Filled 2021-09-09: qty 1

## 2021-09-09 MED ORDER — LIDOCAINE 2% (20 MG/ML) 5 ML SYRINGE
INTRAMUSCULAR | Status: DC | PRN
  Administered 2021-09-09: 70 mg via INTRAVENOUS

## 2021-09-09 MED ORDER — TRIAMCINOLONE ACETONIDE 40 MG/ML IJ SUSP
INTRAMUSCULAR | Status: DC | PRN
  Administered 2021-09-09: 40 mg via INTRAMUSCULAR

## 2021-09-09 MED ORDER — ACETAMINOPHEN 10 MG/ML IV SOLN
INTRAVENOUS | Status: AC
  Filled 2021-09-09: qty 100

## 2021-09-09 MED ORDER — BUPIVACAINE-EPINEPHRINE (PF) 0.25% -1:200000 IJ SOLN
INTRAMUSCULAR | Status: AC
  Filled 2021-09-09: qty 30

## 2021-09-09 MED ORDER — ACETAMINOPHEN 160 MG/5ML PO SOLN: 1000.0000 mg | Freq: Once | ORAL | Status: DC | PRN

## 2021-09-09 MED ORDER — METHOCARBAMOL 500 MG PO TABS
500.0000 mg | ORAL_TABLET | Freq: Four times a day (QID) | ORAL | Status: DC | PRN
  Administered 2021-09-09 – 2021-09-10 (×3): 500 mg via ORAL
  Filled 2021-09-09 (×3): qty 1

## 2021-09-09 MED ORDER — ONDANSETRON HCL 4 MG/2ML IJ SOLN: 4.0000 mg | Freq: Four times a day (QID) | INTRAMUSCULAR | Status: DC | PRN

## 2021-09-09 MED ORDER — OXYCODONE HCL 5 MG PO TABS: 5.0000 mg | ORAL_TABLET | Freq: Once | ORAL | Status: DC | PRN

## 2021-09-09 MED ORDER — SUGAMMADEX SODIUM 200 MG/2ML IV SOLN
INTRAVENOUS | Status: DC | PRN
  Administered 2021-09-09: 200 mg via INTRAVENOUS

## 2021-09-09 MED ORDER — THROMBIN 5000 UNITS EX SOLR
CUTANEOUS | Status: AC
  Filled 2021-09-09: qty 5000

## 2021-09-09 MED ORDER — LEVOTHYROXINE SODIUM 75 MCG PO TABS
75.0000 ug | ORAL_TABLET | Freq: Every day | ORAL | Status: DC
  Administered 2021-09-10: 75 ug via ORAL
  Filled 2021-09-09: qty 1

## 2021-09-09 MED ORDER — OXYCODONE HCL 5 MG PO TABS
10.0000 mg | ORAL_TABLET | ORAL | Status: DC | PRN
  Administered 2021-09-09 – 2021-09-10 (×7): 10 mg via ORAL
  Filled 2021-09-09 (×7): qty 2

## 2021-09-09 MED ORDER — FENTANYL CITRATE (PF) 100 MCG/2ML IJ SOLN
25.0000 ug | INTRAMUSCULAR | Status: DC | PRN
  Administered 2021-09-09 (×2): 25 ug via INTRAVENOUS
  Administered 2021-09-09: 50 ug via INTRAVENOUS
  Administered 2021-09-09 (×2): 25 ug via INTRAVENOUS

## 2021-09-09 MED ORDER — HEMOSTATIC AGENTS (NO CHARGE) OPTIME
TOPICAL | Status: DC | PRN
  Administered 2021-09-09: 1 via TOPICAL

## 2021-09-09 MED ORDER — PHENOL 1.4 % MT LIQD: 1.0000 | OROMUCOSAL | Status: DC | PRN

## 2021-09-09 MED ORDER — HYDROMORPHONE HCL 1 MG/ML IJ SOLN: 0.5000 mg | INTRAMUSCULAR | Status: DC | PRN

## 2021-09-09 MED ORDER — 0.9 % SODIUM CHLORIDE (POUR BTL) OPTIME
TOPICAL | Status: DC | PRN
  Administered 2021-09-09: 1000 mL

## 2021-09-09 MED ORDER — PHENYLEPHRINE 40 MCG/ML (10ML) SYRINGE FOR IV PUSH (FOR BLOOD PRESSURE SUPPORT)
PREFILLED_SYRINGE | INTRAVENOUS | Status: DC | PRN
Start: 2021-09-09 — End: 2021-09-09
  Administered 2021-09-09: 120 ug via INTRAVENOUS
  Administered 2021-09-09: 80 ug via INTRAVENOUS

## 2021-09-09 MED ORDER — PROPOFOL 10 MG/ML IV BOLUS
INTRAVENOUS | Status: DC | PRN
  Administered 2021-09-09: 130 mg via INTRAVENOUS

## 2021-09-09 MED ORDER — ONDANSETRON HCL 4 MG/2ML IJ SOLN
INTRAMUSCULAR | Status: AC
  Filled 2021-09-09: qty 2

## 2021-09-09 MED ORDER — SODIUM CHLORIDE 0.9% FLUSH: 3.0000 mL | INTRAVENOUS | Status: DC | PRN

## 2021-09-09 MED ORDER — MIDAZOLAM HCL 2 MG/2ML IJ SOLN
INTRAMUSCULAR | Status: AC
  Filled 2021-09-09: qty 2

## 2021-09-09 SURGICAL SUPPLY — 94 items
ADH SKN CLS APL DERMABOND .7 (GAUZE/BANDAGES/DRESSINGS) ×2
APL SKNCLS STERI-STRIP NONHPOA (GAUZE/BANDAGES/DRESSINGS)
BAG COUNTER SPONGE SURGICOUNT (BAG) ×5 IMPLANT
BAG SPNG CNTER NS LX DISP (BAG) ×4
BAND INSRT 18 STRL LF DISP RB (MISCELLANEOUS) ×4
BAND RUBBER #18 3X1/16 STRL (MISCELLANEOUS) ×8 IMPLANT
BENZOIN TINCTURE PRP APPL 2/3 (GAUZE/BANDAGES/DRESSINGS) IMPLANT
BIT DRILL NEURO 2X3.1 SFT TUCH (MISCELLANEOUS) ×3 IMPLANT
BLADE CLIPPER SURG (BLADE) IMPLANT
BLADE SURG 10 STRL SS (BLADE) IMPLANT
BLADE SURG 15 STRL LF DISP TIS (BLADE) ×3 IMPLANT
BLADE SURG 15 STRL SS (BLADE) ×3
BNDG ELASTIC 3X5.8 VLCR STR LF (GAUZE/BANDAGES/DRESSINGS) ×3 IMPLANT
BNDG GAUZE ELAST 4 BULKY (GAUZE/BANDAGES/DRESSINGS) ×4 IMPLANT
BUR CARBIDE MATCH 3.0 (BURR) ×4 IMPLANT
CABLE BIPOLOR RESECTION CORD (MISCELLANEOUS) ×4 IMPLANT
CANISTER SUCT 3000ML PPV (MISCELLANEOUS) ×4 IMPLANT
CARTRIDGE OIL MAESTRO DRILL (MISCELLANEOUS) ×3 IMPLANT
CNTNR URN SCR LID CUP LEK RST (MISCELLANEOUS) ×3 IMPLANT
CONT SPEC 4OZ STRL OR WHT (MISCELLANEOUS) ×3
COVER MAYO STAND STRL (DRAPES) ×8 IMPLANT
DECANTER SPIKE VIAL GLASS SM (MISCELLANEOUS) ×4 IMPLANT
DERMABOND ADVANCED (GAUZE/BANDAGES/DRESSINGS) ×1
DERMABOND ADVANCED .7 DNX12 (GAUZE/BANDAGES/DRESSINGS) ×3 IMPLANT
DIFFUSER DRILL AIR PNEUMATIC (MISCELLANEOUS) ×4 IMPLANT
DRAPE C-ARM 42X72 X-RAY (DRAPES) ×4 IMPLANT
DRAPE EXTREMITY T 121X128X90 (DISPOSABLE) ×3 IMPLANT
DRAPE HALF SHEET 40X57 (DRAPES) IMPLANT
DRAPE INCISE 23X17 IOBAN STRL (DRAPES) ×1
DRAPE INCISE 23X17 STRL (DRAPES) IMPLANT
DRAPE INCISE IOBAN 23X17 STRL (DRAPES) ×2 IMPLANT
DRAPE LAPAROTOMY 100X72 PEDS (DRAPES) ×2 IMPLANT
DRAPE LAPAROTOMY 100X72X124 (DRAPES) ×3 IMPLANT
DRAPE MICROSCOPE LEICA (MISCELLANEOUS) ×4 IMPLANT
DRILL NEURO 2X3.1 SOFT TOUCH (MISCELLANEOUS) ×3
DRSG OPSITE 4X5.5 SM (GAUZE/BANDAGES/DRESSINGS) ×1 IMPLANT
DURAPREP 6ML APPLICATOR 50/CS (WOUND CARE) ×4 IMPLANT
ELECT BLADE INSULATED 4IN (ELECTROSURGICAL) ×3
ELECT COATED BLADE 2.86 ST (ELECTRODE) ×4 IMPLANT
ELECT REM PT RETURN 9FT ADLT (ELECTROSURGICAL) ×3
ELECTRODE BLADE INSULATED 4IN (ELECTROSURGICAL) IMPLANT
ELECTRODE REM PT RTRN 9FT ADLT (ELECTROSURGICAL) ×3 IMPLANT
EVACUATOR 1/8 PVC DRAIN (DRAIN) IMPLANT
GAUZE 4X4 16PLY ~~LOC~~+RFID DBL (SPONGE) ×1 IMPLANT
GAUZE SPONGE 4X4 12PLY STRL (GAUZE/BANDAGES/DRESSINGS) ×4 IMPLANT
GAUZE XEROFORM 1X8 LF (GAUZE/BANDAGES/DRESSINGS) IMPLANT
GLOVE EXAM NITRILE LRG STRL (GLOVE) IMPLANT
GLOVE EXAM NITRILE XL STR (GLOVE) IMPLANT
GLOVE EXAM NITRILE XS STR PU (GLOVE) IMPLANT
GLOVE SRG 8 PF TXTR STRL LF DI (GLOVE) ×6 IMPLANT
GLOVE SURG LTX SZ7.5 (GLOVE) ×8 IMPLANT
GLOVE SURG LTX SZ8 (GLOVE) ×12 IMPLANT
GLOVE SURG UNDER POLY LF SZ8 (GLOVE) ×9
GOWN STRL REUS W/ TWL LRG LVL3 (GOWN DISPOSABLE) IMPLANT
GOWN STRL REUS W/ TWL XL LVL3 (GOWN DISPOSABLE) ×6 IMPLANT
GOWN STRL REUS W/TWL 2XL LVL3 (GOWN DISPOSABLE) IMPLANT
GOWN STRL REUS W/TWL LRG LVL3 (GOWN DISPOSABLE)
GOWN STRL REUS W/TWL XL LVL3 (GOWN DISPOSABLE) ×12
HEMOSTAT POWDER KIT SURGIFOAM (HEMOSTASIS) ×4 IMPLANT
HEMOSTAT POWDER SURGIFOAM 1G (HEMOSTASIS) ×1 IMPLANT
KIT BASIN OR (CUSTOM PROCEDURE TRAY) ×7 IMPLANT
KIT TURNOVER KIT B (KITS) ×8 IMPLANT
MARKER SKIN DUAL TIP RULER LAB (MISCELLANEOUS) ×4 IMPLANT
NDL HYPO 25X1 1.5 SAFETY (NEEDLE) ×3 IMPLANT
NDL SPNL 18GX3.5 QUINCKE PK (NEEDLE) ×3 IMPLANT
NEEDLE HYPO 22GX1.5 SAFETY (NEEDLE) ×4 IMPLANT
NEEDLE HYPO 25X1 1.5 SAFETY (NEEDLE) ×3 IMPLANT
NEEDLE SPNL 18GX3.5 QUINCKE PK (NEEDLE) ×3 IMPLANT
NS IRRIG 1000ML POUR BTL (IV SOLUTION) ×7 IMPLANT
OIL CARTRIDGE MAESTRO DRILL (MISCELLANEOUS) ×3
PACK LAMINECTOMY NEURO (CUSTOM PROCEDURE TRAY) ×4 IMPLANT
PACK SURGICAL SETUP 50X90 (CUSTOM PROCEDURE TRAY) ×4 IMPLANT
PAD ARMBOARD 7.5X6 YLW CONV (MISCELLANEOUS) ×12 IMPLANT
PIN DISTRACTION 14MM (PIN) ×8 IMPLANT
PLATE CERV OZARK 38 2LVL (Plate) ×1 IMPLANT
PUTTY BONE 100 VESUVIUS 2.5CC (Putty) ×1 IMPLANT
SCREW FIXED ST OZARK 4X16 (Screw) ×2 IMPLANT
SCREW VA ST OZARK 4X16 (Plate) ×4 IMPLANT
SPACER ANGLD CASCAD 16X13X7 7D (Spacer) ×1 IMPLANT
SPACER LORD CASCAD 13X16X6 7D (Spacer) ×1 IMPLANT
SPONGE INTESTINAL PEANUT (DISPOSABLE) ×5 IMPLANT
SPONGE SURGIFOAM ABS GEL SZ50 (HEMOSTASIS) ×4 IMPLANT
STAPLER VISISTAT 35W (STAPLE) IMPLANT
STRIP CLOSURE SKIN 1/2X4 (GAUZE/BANDAGES/DRESSINGS) ×4 IMPLANT
SUT ETHILON 3 0 PS 1 (SUTURE) ×1 IMPLANT
SUT VIC AB 2-0 CP2 18 (SUTURE) ×4 IMPLANT
SUT VIC AB 3-0 SH 8-18 (SUTURE) ×1 IMPLANT
SYR CONTROL 10ML LL (SYRINGE) ×4 IMPLANT
TAPE SURG TRANSPORE 1 IN (GAUZE/BANDAGES/DRESSINGS) ×3 IMPLANT
TAPE SURGICAL TRANSPORE 1 IN (GAUZE/BANDAGES/DRESSINGS) ×3
TOWEL GREEN STERILE (TOWEL DISPOSABLE) ×8 IMPLANT
TOWEL GREEN STERILE FF (TOWEL DISPOSABLE) ×8 IMPLANT
TUBE CONNECTING 12X1/4 (SUCTIONS) IMPLANT
WATER STERILE IRR 1000ML POUR (IV SOLUTION) ×7 IMPLANT

## 2021-09-09 NOTE — H&P (Addendum)
Providing Compassionate, Quality Care - Together  NEUROSURGERY HISTORY & PHYSICAL   [name] is an 66 y.o. female.   Chief Complaint: Left upper extremity radiculopathy HPI: This is a 66 year old female with a history of strokes, intracranial stent, and a complaint of left upper extremity radiculopathy.  MRI of the cervical spine revealed severe stenosis left greater than right foraminal stenosis at C4-5, C5-6, EMG revealed left upper extremity severe carpal tunnel syndrome.  She presents today for surgical intervention.  Past Medical History:  Diagnosis Date   Abnormal gait    Amnesia    Anemia    hx   Aneurysm (HCC)    Anxiety    Asthma    Atrophic vaginitis    Benign hypertensive heart disease    Cardiomegaly    Chest pain    COPD (chronic obstructive pulmonary disease) ([place])     - PFTs  01/24/06 FEV1 69% ratio 68% diffusing capacity 57% with no improvement after B2   - PFT's 10/1/ 09 FEV1 77   ratio  76    dlco                   45            no resp to B2    - Nl alpha one antitripsin level 10/09   Cough    Depression    Difficulty speaking    Disorder of nasal cavity    Dyspnea    Enthesopathy of elbow region    GERD (gastroesophageal reflux disease)    Hammer toe    Headache(784.0)    Hemiparesis affecting dominant side as late effect of cerebrovascular accident ([place])    History of kidney stones    Hyperlipidemia    Hypertension    Hypothyroidism    Increased urinary frequency    Low compliance bladder    Macular degeneration of both eyes    Malaise and fatigue    Nonruptured cerebral aneurysm    Obesity    Pneumonia    hx   Pure hypercholesterolemia    Recurrent major depressive episodes ([place])    Seizure disorder, secondary ([place])    2-3 focal siezures monthly   Seizures ([place])    Short-term memory loss    since stroke   Skin sensation disturbance    Stroke [facility]) 2011   rt sided weakness   Swelling of limb    TMJ (temporomandibular joint  disorder)    Urinary tract infectious disease    Vertigo as late effect of stroke    Vitamin D deficiency     Past Surgical History:  Procedure Laterality Date   BACK SURGERY     CEREBRAL ANGIOGRAM     x3   COLONOSCOPY WITH PROPOFOL N/A 08/18/2016   Procedure: COLONOSCOPY WITH PROPOFOL;  Surgeon: Carol Ada, MD;  Location: [facility];  Service: Endoscopy;  Laterality: N/A;   LAPAROSCOPIC HYSTERECTOMY     MANDIBLE FRACTURE SURGERY     RADIOLOGY WITH ANESTHESIA N/A 08/21/2013   Procedure: RADIOLOGY WITH ANESTHESIA;  Surgeon: Rob Hickman, MD;  Location: [facility];  Service: Radiology;  Laterality: N/A;   stones     no surgery    Family History  Problem Relation Age of Onset   Heart disease Mother    Heart disease Father    Heart attack Father    Cancer Father        prostate to lymph node   Lung cancer  Other    Breast cancer Neg Hx    Social History:  reports that she quit smoking about 11 years ago. Her smoking use included cigarettes. She has a 60.00 pack-year smoking history. She has never used smokeless tobacco. She reports that she does not drink alcohol and does not use drugs.  Allergies:  Allergies  Allergen Reactions   Phenobarbital Hives, Shortness Of Breath and Swelling   Phenytoin Anaphylaxis, Hives and Swelling    Medications Prior to Admission  Medication Sig Dispense Refill   albuterol (PROVENTIL) (2.5 MG/3ML) 0.083% nebulizer solution Take 3 mLs (2.5 mg total) by nebulization every 6 (six) hours. 1080 mL 1   albuterol (VENTOLIN HFA) 108 (90 Base) MCG/ACT inhaler Inhale 2 puffs into the lungs every 6 (six) hours as needed for wheezing or shortness of breath. 24 g 3   ALPRAZolam (XANAX) 0.25 MG tablet Take 1 tablet (0.25 mg total) by mouth 2 (two) times daily as needed. 60 tablet 2   aspirin EC 81 MG tablet Take 81 mg by mouth daily.     Biotin 1 MG CAPS Take 1 tablet by mouth 2 (two) times daily. Viviscal     Calcium-Magnesium-Zinc (CAL-MAG-ZINC PO)  Take 1 tablet by mouth daily.     Cholecalciferol (VITAMIN D-3) 1000 units CAPS Take 2,000 Units by mouth at bedtime.     citalopram (CELEXA) 40 MG tablet TAKE 1 TABLET AT BEDTIME 90 tablet 3   clopidogrel (PLAVIX) 75 MG tablet TAKE 1 TABLET DAILY 90 tablet 3   cyclobenzaprine (FLEXERIL) 10 MG tablet Take 1 tablet (10 mg total) by mouth 3 (three) times daily as needed. 90 tablet 5   fluticasone (FLONASE) 50 MCG/ACT nasal spray Place 2 sprays into both nostrils daily as needed (allergies or post-nasl drip).   3   levETIRAcetam (KEPPRA) 500 MG tablet Take 1 tablet (500 mg total) by mouth every 12 (twelve) hours. 180 tablet 3   levothyroxine (SYNTHROID) 50 MCG tablet TAKE 1 TABLET ON Saturday and Sunday 39 tablet 3   levothyroxine (SYNTHROID) 75 MCG tablet TAKE 1 TABLET IN THE MORNING BEFORE BREAKFAST ON Monday through Firday (TAKE 50 MCG ON OTHER DAYS) 51 tablet 3   Multiple Vitamins-Minerals (PRESERVISION/LUTEIN PO) Take 1 tablet by mouth daily.     Omega-3 Fatty Acids (FISH OIL) 1000 MG CAPS Take 2,000 mg by mouth 2 (two) times daily.      Polyethyl Glycol-Propyl Glycol (SYSTANE) 0.4-0.3 % SOLN Place 1 drop into both eyes daily as needed (dry eyes).     Psyllium (FIBER) 28.3 % POWD Take 10 mLs by mouth 2 (two) times daily.     rosuvastatin (CRESTOR) 20 MG tablet Take 1 tablet (20 mg total) by mouth daily. (Patient taking differently: Take 20 mg by mouth at bedtime.) 90 tablet 3   telmisartan (MICARDIS) 20 MG tablet TAKE 1 TABLET DAILY 90 tablet 3   vitamin C (ASCORBIC ACID) 500 MG tablet Take 1,000 mg by mouth at bedtime. Rose hips     diclofenac Sodium (VOLTAREN) 1 % GEL Voltaren 1 % topical gel  APPLY 2 GRAM TO THE AFFECTED AREA(S) BY TOPICAL ROUTE 4 TIMES PER DAY (Patient taking differently: 2 g daily as needed (pain).) 350 g 2   NONFORMULARY OR COMPOUNDED ITEM Apply 240 mg topically daily as needed. (Patient not taking: Reported on 08/25/2021) 1 each 3    Results for orders placed or  performed during the hospital encounter of 09/07/21 (from the past 48 hour(s))  SARS  CORONAVIRUS 2 (TAT 6-24 HRS) Nasopharyngeal Nasopharyngeal Swab     Status: None   Collection Time: 09/07/21  9:12 AM   Specimen: Nasopharyngeal Swab  Result Value Ref Range   SARS Coronavirus 2 NEGATIVE NEGATIVE    Comment: (NOTE) SARS-CoV-2 target nucleic acids are NOT DETECTED.  The SARS-CoV-2 RNA is generally detectable in upper and lower respiratory specimens during the acute phase of infection. Negative results do not preclude SARS-CoV-2 infection, do not rule out co-infections with other pathogens, and should not be used as the sole basis for treatment or other patient management decisions. Negative results must be combined with clinical observations, patient history, and epidemiological information. The expected result is Negative.  Fact Sheet for Patients: SugarRoll.be  Fact Sheet for Healthcare Providers: https://www.woods-mathews.com/  This test is not yet approved or cleared by the [place] FDA and  has been authorized for detection and/or diagnosis of SARS-CoV-2 by FDA under an Emergency Use Authorization (EUA). This EUA will remain  in effect (meaning this test can be used) for the duration of the COVID-19 declaration under Se ction 564(b)(1) of the Act, 21 U.S.C. section 360bbb-3(b)(1), unless the authorization is terminated or revoked sooner.  Performed at [facility], [place] [address] [place], [place] [address]    No results found.  ROS All +/- in HPI  Blood pressure 135/71, pulse 91, temperature 97.8 F (36.6 C), temperature source Oral, resp. rate 18, height 5\' 2"  (1.575 m), weight 73.5 kg, SpO2 96 %. 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  All risks benefits and expected outcomes were discussed and agreed upon. Medical clearance obtained.   Thank you for allowing me to participate in this patient's care.  Please do not hesitate to call with questions or concerns.   Elwin Sleight, [facility] Cell: [phone]

## 2021-09-09 NOTE — Progress Notes (Signed)
° °  Providing Compassionate, Quality Care - Together  NEUROSURGERY PROGRESS NOTE   S: Patient seen and examined in recovery room.  Complains of appropriate neck pain.  O: EXAM:  BP 114/63 (BP Location: Right Arm)    Pulse 90    Temp (!) 97.2 F (36.2 C)    Resp 14    Ht 5\' 2"  (1.575 m)    Wt 73.5 kg    SpO2 95%    BMI 29.63 kg/m   Awake, alert PERRL Speech fluent, appropriate  CNs grossly intact  Left upper extremity 4+/5, left lower extremity 4+/5 Right upper, lower extremity 4+/5, chronic right-sided weakness from previous stroke Neck soft, supple, trachea midline Normal phonation C-collar in place Dressing clean dry and intact Left wrist wrapping in place, grip intact  ASSESSMENT:  66 y.o. 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  Please do not hesitate to call with questions or concerns.   Elwin Sleight, [facility] Cell: [phone]

## 2021-09-09 NOTE — Anesthesia Postprocedure Evaluation (Signed)
Anesthesia Post Note  Patient: [name]  Procedure(s) Performed: Anterior  Cervical Decompression Fusion  Cervical four-five, Cervical five-six LEFT CARPAL TUNNEL RELEASE (Left)     Patient location during evaluation: PACU Anesthesia Type: General Level of consciousness: awake and alert Pain management: pain level controlled Vital Signs Assessment: post-procedure vital signs reviewed and stable Respiratory status: spontaneous breathing, nonlabored ventilation, respiratory function stable and patient connected to nasal cannula oxygen Cardiovascular status: blood pressure returned to baseline and stable Postop Assessment: no apparent nausea or vomiting Anesthetic complications: no   No notable events documented.  Last Vitals:  Vitals:   09/09/21 1344 09/09/21 1547  BP: (!) 117/56 119/76  Pulse: 93 91  Resp: 18 18  Temp: (!) 36.3 C 36.6 C  SpO2: 97% 96%    Last Pain:  Vitals:   09/09/21 1255  TempSrc:   PainSc: 5                  [name]

## 2021-09-09 NOTE — Transfer of Care (Addendum)
Immediate Anesthesia Transfer of Care Note  Patient: [name]  Procedure(s) Performed: Anterior  Cervical Decompression Fusion  Cervical four-five, Cervical five-six LEFT CARPAL TUNNEL RELEASE (Left)  Patient Location: PACU  Anesthesia Type:General  Level of Consciousness: awake, alert , oriented and patient cooperative  Airway & Oxygen Therapy: Patient connected to nasal cannula oxygen, spontaneously breathing  Post-op Assessment: Report given to RN.  Post op VS reviewed and stable.  Post vital signs: Reviewed and stable  Last Vitals:  Vitals Value Taken Time  BP 130/72 09/09/21 1058  Temp    Pulse 93 09/09/21 1103  Resp 15 09/09/21 1103  SpO2 95 % 09/09/21 1103  Vitals shown include unvalidated device data.  Last Pain:  Vitals:   09/09/21 0622  TempSrc:   PainSc: 7       Patients Stated Pain Goal: 2 (38/93/73 4287)  Complications: No notable events documented.

## 2021-09-09 NOTE — Anesthesia Procedure Notes (Signed)
Procedure Name: Intubation Date/Time: 09/09/2021 8:03 AM Performed by: Colin Benton, CRNA Pre-anesthesia Checklist: Patient identified, Emergency Drugs available, Suction available and Patient being monitored Patient Re-evaluated:Patient Re-evaluated prior to induction Oxygen Delivery Method: Circle system utilized Preoxygenation: Pre-oxygenation with 100% oxygen Induction Type: IV induction Ventilation: Mask ventilation without difficulty and Oral airway inserted - appropriate to patient size Laryngoscope Size: Sabra Heck and 2 Grade View: Grade II Tube type: Oral Tube size: 7.5 mm Number of attempts: 1 Airway Equipment and Method: Stylet and Oral airway Placement Confirmation: ETT inserted through vocal cords under direct vision, positive ETCO2 and breath sounds checked- equal and bilateral Secured at: 22 cm Tube secured with: Tape Dental Injury: Teeth and Oropharynx as per pre-operative assessment

## 2021-09-10 ENCOUNTER — Encounter (HOSPITAL_COMMUNITY): Payer: Self-pay | Admitting: Neurological Surgery

## 2021-09-10 DIAGNOSIS — M4722 Other spondylosis with radiculopathy, cervical region: Secondary | ICD-10-CM | POA: Diagnosis not present

## 2021-09-10 DIAGNOSIS — E039 Hypothyroidism, unspecified: Secondary | ICD-10-CM | POA: Diagnosis not present

## 2021-09-10 DIAGNOSIS — G5602 Carpal tunnel syndrome, left upper limb: Secondary | ICD-10-CM | POA: Diagnosis not present

## 2021-09-10 DIAGNOSIS — M4712 Other spondylosis with myelopathy, cervical region: Secondary | ICD-10-CM | POA: Diagnosis not present

## 2021-09-10 DIAGNOSIS — J449 Chronic obstructive pulmonary disease, unspecified: Secondary | ICD-10-CM | POA: Diagnosis not present

## 2021-09-10 DIAGNOSIS — M4802 Spinal stenosis, cervical region: Secondary | ICD-10-CM | POA: Diagnosis not present

## 2021-09-10 MED ORDER — METHOCARBAMOL 500 MG PO TABS: 500.0000 mg | ORAL_TABLET | Freq: Four times a day (QID) | ORAL | 1 refills | Status: DC

## 2021-09-10 MED ORDER — OXYCODONE-ACETAMINOPHEN 5-325 MG PO TABS: 1.0000 | ORAL_TABLET | ORAL | 0 refills | Status: DC | PRN

## 2021-09-10 NOTE — TOC Transition Note (Signed)
Transition of Care [facility]) - CM/SW Discharge Note   Patient Details  Name: [name] MRN: [id number] Date of Birth: [birth year]  Transition of Care [facility]) CM/SW Contact:  Verdell Carmine, RN Phone Number: 09/10/2021, 10:06 AM   Clinical Narrative:    The Transition of Care Department [facility]) has reviewed patient and no TOC needs have been identified at this time. We will continue to monitor patient advancement through interdisciplinary progression rounds. If new patient transition needs arise, please place a TOC consult          Patient Goals and CMS Choice        Discharge Placement                       Discharge Plan and Services                                     Social Determinants of Health (SDOH) Interventions     Readmission Risk Interventions No flowsheet data found.

## 2021-09-10 NOTE — Care Management Obs Status (Signed)
[facility] NOTIFICATION   Patient Details  Name: [name] MRN: [id number] Date of Birth: July 21, 1956   Medicare Observation Status Notification Given:  Yes   Verbal permission given to sign that patient received notice due to phone interaction and remote Verdell Carmine, RN 09/10/2021, 1:23 PM

## 2021-09-10 NOTE — Progress Notes (Signed)
Physical Therapy Treatment Patient Details Name: [name] MRN: [id number] DOB: 15-Nov-1955 Today's Date: 09/10/2021   History of Present Illness Pt is a 66 y/o female who presents s/p C4-6 ACDF and left carpal tunnel release on 09/09/2021. PHM significant for Aneurysm, amnesia, CVA with residual R side weakness, seizure disorder, and vertigo, fall in last year injuring her right hand ("it has not been the same since").    PT Comments    Focus of session was setting up B platform walker for home use, adjusting positioning of platforms, and gait training with full B platform walker. Pt was able to progress to requiring min assist for gait and noted L lateral drift was improved. Will continue to follow and progress as able per POC.     Recommendations for follow up therapy are one component of a multi-disciplinary discharge planning process, led by the attending physician.  Recommendations may be updated based on patient status, additional functional criteria and insurance authorization.  Follow Up Recommendations  Home health PT     Assistance Recommended at Discharge Frequent or constant Supervision/Assistance  Patient can return home with the following A lot of help with walking and/or transfers;Assistance with cooking/housework;Assist for transportation;Help with stairs or ramp for entrance   Equipment Recommendations  Rolling walker (2 wheels);Other (comment) (Bilateral platform)    Recommendations for Other Services       Precautions / Restrictions Precautions Precautions: Fall;Cervical Precaution Booklet Issued: Yes (comment) Required Braces or Orthoses: Cervical Brace Cervical Brace: Hard collar;At all times (off for showering) Restrictions Weight Bearing Restrictions: Yes Other Position/Activity Restrictions: no pushing, pulling, lifting (can use functionally for ADLs)     Mobility  Bed Mobility Overal bed mobility: Needs Assistance Bed Mobility:  Rolling;Sidelying to Sit Rolling: Min assist Sidelying to sit: Min assist Supine to sit: Min assist;HOB elevated     General bed mobility comments: VC's for log roll technique and assist to guide pt through full roll and to advance L hip forward to EOB as she sat up to the R side of the bed.    Transfers Overall transfer level: Needs assistance Equipment used: Rolling walker (2 wheels);Left platform walker Transfers: Sit to/from Stand Sit to Stand: Min assist           General transfer comment: VC's for hand placement on seated surface for safety.    Ambulation/Gait Ambulation/Gait assistance: Min assist Gait Distance (Feet): 50 Feet Assistive device: Rolling walker (2 wheels);Bilateral platform walker Gait Pattern/deviations: Step-through pattern;Decreased stride length;Trunk flexed;Drifts right/left;Wide base of support;Decreased dorsiflexion - right Gait velocity: Decreased Gait velocity interpretation: <1.31 ft/sec, indicative of household ambulator   General Gait Details: VC's for sequencing and general safety. Min assist for balance and walker management with B platforms added.   Stairs Stairs: Yes Stairs assistance: Min assist;Mod assist Stair Management: One rail Right;Step to pattern;Forwards;Backwards Number of Stairs: 1 (x4) General stair comments: Husband present for education on safe assist/guarding technique. Pt stepped up 1 step and back down x4. Trialed side step however pt not as comfortable and opted for forward facing technique.   Wheelchair Mobility    Modified Rankin (Stroke Patients Only)       Balance Overall balance assessment: Needs assistance Sitting-balance support: No upper extremity supported;Feet supported Sitting balance-Leahy Scale: Fair     Standing balance support: Bilateral upper extremity supported;Reliant on assistive device for balance Standing balance-Leahy Scale: Poor  Cognition  Arousal/Alertness: Awake/alert Behavior During Therapy: WFL for tasks assessed/performed Overall Cognitive Status: History of cognitive impairments - at baseline                                 General Comments: s/p prior CVA        Exercises      General Comments        Pertinent Vitals/Pain Pain Assessment: Faces Faces Pain Scale: Hurts little more Pain Location: neck and left wrist Pain Descriptors / Indicators: Aching;Sore;Operative site guarding Pain Intervention(s): Limited activity within patient's tolerance;Monitored during session;Repositioned    Home Living Family/patient expects to be discharged to:: Private residence Living Arrangements: Spouse/significant other;Children;Parent;Non-relatives/Friends Available Help at Discharge: Family;Available 24 hours/day Type of Home: House Home Access: Stairs to enter Entrance Stairs-Rails: Right Entrance Stairs-Number of Steps: 4   Home Layout: One level Home Equipment: International aid/development worker (2 wheels);Rollator (4 wheels);[place] held shower head;Wheelchair - manual;Hospital bed      Prior Function            PT Goals (current goals can now be found in the care plan section) Acute Rehab PT Goals Patient Stated Goal: Be able to use her cane again PT Goal Formulation: With patient/family Time For Goal Achievement: 09/17/21 Potential to Achieve Goals: Good Progress towards PT goals: Progressing toward goals    Frequency    Min 5X/week      PT Plan Current plan remains appropriate    Co-evaluation              AM-PAC PT "6 Clicks" Mobility   Outcome Measure  Help needed turning from your back to your side while in a flat bed without using bedrails?: A Little Help needed moving from lying on your back to sitting on the side of a flat bed without using bedrails?: A Little Help needed moving to and from a bed to a chair (including a wheelchair)?: A Little Help  needed standing up from a chair using your arms (e.g., wheelchair or bedside chair)?: A Little Help needed to walk in hospital room?: A Little Help needed climbing 3-5 steps with a railing? : A Lot 6 Click Score: 17    End of Session Equipment Utilized During Treatment: Gait belt Activity Tolerance: Patient tolerated treatment well Patient left: in chair;with call bell/phone within reach;with family/visitor present Nurse Communication: Mobility status PT Visit Diagnosis: Unsteadiness on feet (R26.81);Pain Pain - part of body:  (neck, L wrist)     Time: 7353-2992 PT Time Calculation (min) (ACUTE ONLY): 24 min  Charges:  $Gait Training: 23-37 mins                     Rolinda Roan, PT, DPT Acute Rehabilitation Services Pager: [phone] Office: [phone]    Thelma Comp 09/10/2021, 12:49 PM

## 2021-09-10 NOTE — Evaluation (Signed)
Physical Therapy Evaluation Patient Details Name: [name] MRN: [id number] DOB: [birth year] Today's Date: 09/10/2021  History of Present Illness  Pt is a 66 y/o female who presents s/p C4-6 ACDF and left carpal tunnel release on 09/09/2021. PHM significant for Aneurysm, amnesia, CVA with residual R side weakness, seizure disorder, and vertigo, fall in last year injuring her right hand ("it has not been the same since").   Clinical Impression  Pt admitted with above diagnosis. At the time of PT eval, pt was able to demonstrate transfers and ambulation with up to mod assist for balance support and safety as well as for RW management. Pt's husband present and supportive throughout session. They were educated on precautions, brace application/wearing schedule, appropriate activity progression, and car transfer. Feel a bilateral platform may be helpful for this patient as her R hand was very fatigued after holding the walker during gait training, and she was attempting to keep weight off the L hand due to pain. Pt currently with functional limitations due to the deficits listed below (see PT Problem List). Pt will benefit from skilled PT to increase their independence and safety with mobility to allow discharge to the venue listed below.         Recommendations for follow up therapy are one component of a multi-disciplinary discharge planning process, led by the attending physician.  Recommendations may be updated based on patient status, additional functional criteria and insurance authorization.  Follow Up Recommendations Home health PT    Assistance Recommended at Discharge Frequent or constant Supervision/Assistance  Patient can return home with the following  A lot of help with walking and/or transfers;Assistance with cooking/housework;Assist for transportation;Help with stairs or ramp for entrance    Equipment Recommendations Rolling walker (2 wheels);Other (comment) (Bilateral platform)   Recommendations for Other Services       Functional Status Assessment Patient has had a recent decline in their functional status and demonstrates the ability to make significant improvements in function in a reasonable and predictable amount of time.     Precautions / Restrictions Precautions Precautions: Fall;Cervical Precaution Booklet Issued: Yes (comment) Required Braces or Orthoses: Cervical Brace Cervical Brace: Hard collar;At all times (off for showering) Restrictions Weight Bearing Restrictions: Yes Other Position/Activity Restrictions: no pushing, pulling, lifting (can use functionally for ADLs)      Mobility  Bed Mobility Overal bed mobility: Needs Assistance Bed Mobility: Supine to Sit     Supine to sit: Min assist;HOB elevated     General bed mobility comments: Pt received sitting up in the chair s/p OT session    Transfers Overall transfer level: Needs assistance Equipment used: Rolling walker (2 wheels);Left platform walker Transfers: Sit to/from Stand Sit to Stand: Min assist           General transfer comment: Consistent assist required for power up to full stand. VC's for hand placement on seated surface for safety as well as to limit anterior lean with stand.    Ambulation/Gait Ambulation/Gait assistance: Min assist;Mod assist Gait Distance (Feet): 200 Feet Assistive device: Rolling walker (2 wheels);Left platform walker Gait Pattern/deviations: Step-through pattern;Decreased stride length;Trunk flexed;Drifts right/left;Wide base of support;Decreased dorsiflexion - right Gait velocity: Decreased Gait velocity interpretation: <1.31 ft/sec, indicative of household ambulator   General Gait Details: VC's for sequencing and general safety. Min assist for balance and mod assist for walker management as pt with significant L drift. Improved some with addition of L platform, however platform mainly to take weight off the L hand/wrist  2 carpal tunnel  release.  Stairs Stairs: Yes Stairs assistance: Min assist;Mod assist Stair Management: One rail Right;Step to pattern;Forwards;Backwards Number of Stairs: 1 (x4) General stair comments: Husband present for education on safe assist/guarding technique. Pt stepped up 1 step and back down x4. Trialed side step however pt not as comfortable and opted for forward facing technique.  Wheelchair Mobility    Modified Rankin (Stroke Patients Only)       Balance Overall balance assessment: Needs assistance Sitting-balance support: No upper extremity supported;Feet supported Sitting balance-Leahy Scale: Fair     Standing balance support: Bilateral upper extremity supported;Reliant on assistive device for balance Standing balance-Leahy Scale: Poor                               Pertinent Vitals/Pain Pain Assessment: Faces Faces Pain Scale: Hurts little more Pain Location: neck and left wrist Pain Descriptors / Indicators: Aching;Sore;Operative site guarding Pain Intervention(s): Limited activity within patient's tolerance;Monitored during session;Repositioned    Home Living Family/patient expects to be discharged to:: Private residence Living Arrangements: Spouse/significant other;Children;Parent;Non-relatives/Friends Available Help at Discharge: Family;Available 24 hours/day Type of Home: House Home Access: Stairs to enter Entrance Stairs-Rails: Right Entrance Stairs-Number of Steps: 4   Home Layout: One level Home Equipment: International aid/development worker (2 wheels);Rollator (4 wheels);[place] held shower head;Wheelchair - manual;Hospital bed      Prior Function Prior Level of Function : Independent/Modified Independent;History of Falls (last six months)             Mobility Comments: Utilizing cane PTA and husband's assist for balance and L drift       Hand Dominance   Dominant Hand: Right (but mainly uses left now due to fall eariler this  year when she injured her right hand)    Extremity/Trunk Assessment   Upper Extremity Assessment Upper Extremity Assessment: Defer to OT evaluation RUE Deficits / Details: Decreased use since she fell eariler this past year, tries to use it some and has had therapy for it this past year. Per her report cannot write her name with it (could prior to fall) RUE Coordination: decreased fine motor LUE Deficits / Details: New carpal tunnel release this admission, able to self feed with it and pull up pants with it LUE Coordination: decreased fine motor;decreased gross motor    Lower Extremity Assessment Lower Extremity Assessment: RLE deficits/detail RLE Deficits / Details: Decreased strength, coordination, and AROM consistent with pre-op diagnosis and prior CVA    Cervical / Trunk Assessment Cervical / Trunk Assessment: Neck Surgery  Communication   Communication: No difficulties  Cognition Arousal/Alertness: Awake/alert Behavior During Therapy: WFL for tasks assessed/performed Overall Cognitive Status: History of cognitive impairments - at baseline                                 General Comments: s/p prior CVA        General Comments      Exercises     Assessment/Plan    PT Assessment Patient needs continued PT services  PT Problem List Decreased strength;Decreased activity tolerance;Decreased balance;Decreased mobility;Decreased knowledge of use of DME;Decreased safety awareness;Decreased knowledge of precautions;Pain       PT Treatment Interventions DME instruction;Gait training;Stair training;Functional mobility training;Therapeutic exercise;Therapeutic activities;Neuromuscular re-education;Patient/family education    PT Goals (Current goals can be found in the Care Plan section)  Acute Rehab PT  Goals Patient Stated Goal: Be able to use her cane again PT Goal Formulation: With patient/family Time For Goal Achievement: 09/17/21 Potential to Achieve Goals:  Good    Frequency Min 5X/week     Co-evaluation               AM-PAC PT "6 Clicks" Mobility  Outcome Measure Help needed turning from your back to your side while in a flat bed without using bedrails?: A Little Help needed moving from lying on your back to sitting on the side of a flat bed without using bedrails?: A Little Help needed moving to and from a bed to a chair (including a wheelchair)?: A Little Help needed standing up from a chair using your arms (e.g., wheelchair or bedside chair)?: A Little Help needed to walk in hospital room?: A Lot Help needed climbing 3-5 steps with a railing? : A Lot 6 Click Score: 16    End of Session Equipment Utilized During Treatment: Gait belt Activity Tolerance: Patient tolerated treatment well Patient left: in chair;with call bell/phone within reach;with family/visitor present Nurse Communication: Mobility status PT Visit Diagnosis: Unsteadiness on feet (R26.81);Pain Pain - part of body:  (neck, L wrist)    Time: 9532-0233 PT Time Calculation (min) (ACUTE ONLY): 39 min   Charges:   PT Evaluation $PT Eval Low Complexity: 1 Low PT Treatments $Gait Training: 23-37 mins        Rolinda Roan, PT, DPT Acute Rehabilitation Services Pager: [phone] Office: [phone]   Thelma Comp 09/10/2021, 12:34 PM

## 2021-09-10 NOTE — Evaluation (Signed)
Occupational Therapy Evaluation and Discharge Patient Details Name: [name] MRN: [id number] DOB: Feb 27, 1956 Today's Date: 09/10/2021   History of Present Illness Pt is a 66 y/o female who presents s/p C4-6 ACDF and left carpal tunnel release on 09/09/2021. PHM significant for Aneurysm, amnesia, CVA with residual R side weakness, seizure disorder, and vertigo, fall in last year injuring her right hand ("it has not been the same since").   Clinical Impression   This 66 yo female admitted and underwent above presents to acute OT with fall education completed with pt and husband. Pt will need more A with basic ADLs due to not being able to use her LUE as much as she has been (due to new carpal tunnel release)--husband is aware and says there will be no issue with providing her with more A. No further OT needs, we will sign off.       Recommendations for follow up therapy are one component of a multi-disciplinary discharge planning process, led by the attending physician.  Recommendations may be updated based on patient status, additional functional criteria and insurance authorization.   Follow Up Recommendations  No OT follow up    Assistance Recommended at Discharge Frequent or constant Supervision/Assistance  Patient can return home with the following A lot of help with walking and/or transfers;A lot of help with bathing/dressing/bathroom    Functional Status Assessment  Patient has had a recent decline in their functional status and demonstrates the ability to make significant improvements in function in a reasonable and predictable amount of time.  Equipment Recommendations  None recommended by OT       Precautions / Restrictions Precautions Precautions: Fall;Cervical Precaution Booklet Issued: Yes (comment) Required Braces or Orthoses: Cervical Brace Cervical Brace: Hard collar;At all times (off for showering) Restrictions Weight Bearing Restrictions: Yes Other  Position/Activity Restrictions: no pushing, pulling, lifting (can use functionally for ADLs)      Mobility Bed Mobility Overal bed mobility: Needs Assistance Bed Mobility: Supine to Sit     Supine to sit: Min assist;HOB elevated     General bed mobility comments: Pt received sitting up in the chair s/p OT session    Transfers Overall transfer level: Needs assistance Equipment used: Rolling walker (2 wheels);Left platform walker Transfers: Sit to/from Stand Sit to Stand: Min assist                  Balance Overall balance assessment: Needs assistance Sitting-balance support: No upper extremity supported;Feet supported Sitting balance-Leahy Scale: Fair     Standing balance support: Bilateral upper extremity supported;Reliant on assistive device for balance Standing balance-Leahy Scale: Poor                             ADL either performed or assessed with clinical judgement   ADL Overall ADL's : Needs assistance/impaired Eating/Feeding: Set up;Bed level   Grooming: Minimal assistance;Sitting   Upper Body Bathing: Minimal assistance;Sitting   Lower Body Bathing: Moderate assistance Lower Body Bathing Details (indicate cue type and reason): min A sit<>stand Upper Body Dressing : Moderate assistance;Sitting Upper Body Dressing Details (indicate cue type and reason): introduced a button hook and she was able to use it with a couple of buttons with min A. issued button hook to her Lower Body Dressing: Maximal assistance Lower Body Dressing Details (indicate cue type and reason): min A sit<>stand Toilet Transfer: Minimal assistance;Ambulation Toilet Transfer Details (indicate cue type and reason): quad cane,  simulated bed>towards door>back to chair; has a 3n1 at home and can use husband's bathroom that has a sink to the right if she needs it for sit<>stand Toileting- Clothing Manipulation and Hygiene: Minimal assistance;Sit to/from stand                Vision Baseline Vision/History: 6 Macular Degeneration Ability to See in Adequate Light: 0 Adequate Patient Visual Report: No change from baseline              Pertinent Vitals/Pain Pain Assessment: Faces Faces Pain Scale: Hurts little more Pain Location: neck and left wrist Pain Descriptors / Indicators: Aching;Sore;Operative site guarding Pain Intervention(s): Limited activity within patient's tolerance;Monitored during session;Repositioned     Hand Dominance Right (but mainly uses left now due to fall eariler this year when she injured her right hand)   Extremity/Trunk Assessment Upper Extremity Assessment Upper Extremity Assessment: Defer to OT evaluation RUE Deficits / Details: Decreased use since she fell eariler this past year, tries to use it some and has had therapy for it this past year. Per her report cannot write her name with it (could prior to fall) RUE Coordination: decreased fine motor LUE Deficits / Details: New carpal tunnel release this admission, able to self feed with it and pull up pants with it LUE Coordination: decreased fine motor;decreased gross motor   Lower Extremity Assessment Lower Extremity Assessment: RLE deficits/detail RLE Deficits / Details: Decreased strength, coordination, and AROM consistent with pre-op diagnosis and prior CVA   Cervical / Trunk Assessment Cervical / Trunk Assessment: Neck Surgery   Communication Communication Communication: No difficulties   Cognition Arousal/Alertness: Awake/alert Behavior During Therapy: WFL for tasks assessed/performed Overall Cognitive Status: History of cognitive impairments - at baseline                                 General Comments: s/p prior CVA                Home Living Family/patient expects to be discharged to:: Private residence Living Arrangements: Spouse/significant other;Children;Parent;Non-relatives/Friends Available Help at Discharge: Family;Available  24 hours/day Type of Home: House Home Access: Stairs to enter [employer] of Steps: 4 Entrance Stairs-Rails: Right Home Layout: One level     Bathroom Shower/Tub: Occupational psychologist: Handicapped height     Home Equipment: International aid/development worker (2 wheels);Rollator (4 wheels);[place] held shower head;Wheelchair - manual;Hospital bed          Prior Functioning/Environment Prior Level of Function : Independent/Modified Independent;History of Falls (last six months)             Mobility Comments: Utilizing cane PTA and husband's assist for balance and L drift          OT Problem List: Decreased strength;Decreased range of motion;Impaired balance (sitting and/or standing);Pain         OT Goals(Current goals can be found in the care plan section) Acute Rehab OT Goals Patient Stated Goal: to go home and for right to get better (Dr. Reatha Armour told her while I was in the room that they would maybe look at more therapy for her hand in about a month)         AM-PAC OT "6 Clicks" Daily Activity     Outcome Measure Help from another person eating meals?: A Little Help from another person taking care of personal grooming?: A Little Help from another person  toileting, which includes using toliet, bedpan, or urinal?: A Lot Help from another person bathing (including washing, rinsing, drying)?: A Lot Help from another person to put on and taking off regular upper body clothing?: A Lot Help from another person to put on and taking off regular lower body clothing?: Total 6 Click Score: 13   End of Session Equipment Utilized During Treatment: Gait belt (quad cane)  Activity Tolerance: Patient tolerated treatment well Patient left: in chair (with PT going in to see)  OT Visit Diagnosis: Unsteadiness on feet (R26.81);Other abnormalities of gait and mobility (R26.89);Muscle weakness (generalized) (M62.81);Pain Pain - part of body:  (neck  and left wrist)                Time: 7078-6754 OT Time Calculation (min): 48 min Charges:  OT General Charges $OT Visit: 1 Visit OT Evaluation $OT Eval Moderate Complexity: 1 Mod OT Treatments $Self Care/Home Management : 23-37 mins  [address]en [address], OTR/L Acute [employer] Pager [phone] Office [phone]    Almon Register 09/10/2021, 11:39 AM

## 2021-09-10 NOTE — Discharge Summary (Addendum)
Physician Discharge Summary  Patient ID: TESSI EUSTACHE MRN: [id number] DOB/AGE: 65-25-1957 65 y.o.  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 The patient tolerated the procedure well and was taken to the recovery room and then to the floor in stable condition. The hospital course was routine. There were no complications. The wounds remained clean dry and intact. Pt had appropriate upper back/neck soreness and left wrist discomfort. No complaints of arm pain or new N/T/W. The patient remained afebrile with stable vital signs, and tolerated a regular diet. The patient continued to increase activities, and pain was well controlled with oral pain medications.   Consults: None  Significant Diagnostic Studies: radiology: X-Ray: intraoperative   Treatments: surgery: ACDF C4-6, left carpal tunnel release  Discharge Exam: Blood pressure 134/72, pulse (!) 102, temperature 98.4 F (36.9 C), temperature source Oral, resp. rate 18, height 5\' 2"  (1.575 m), weight 73.5 kg, SpO2 91 %.  Physical Exam: Patient is awake, A/O X 4, conversant, and in good spirits. They are in NAD and VSS. Doing well. Speech is fluent and appropriate. MAEW with good strength that is symmetric bilaterally. PERLA, EOMI. CNs grossly intact. Dressings are clean dry intact. Incisions are well approximated with no drainage, erythema, or edema. Hard cervical collar in place.      Disposition: Discharge disposition: 01-Home or Self Care       Discharge  Instructions     Face-to-face encounter (required for Medicare/Medicaid patients)   Complete by: As directed    I Marvis Moeller certify that this patient is under my care and that I, or a nurse practitioner or physician's assistant working with me, had a face-to-face encounter that meets the physician face-to-face encounter requirements with this patient on 09/10/2021. 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, Left carpal tunnel syndrome severe   I certify that, based on my findings, the following services are medically necessary home health services: Physical therapy   Reason for Medically Necessary Home Health Services:  Therapy- Therapeutic Exercises to Increase Strength and Endurance Therapy- Home Adaptation to Facilitate Safety Therapy- Gait Training, Transfer Training and Stair Training     My clinical findings support the need for the above services: Pain interferes with ambulation/mobility   Further, I certify that my clinical findings support that this patient is homebound due to:  Unable to leave home safely without assistance Ambulates short distances less than 300 feet     Home Health   Complete by: As directed    To provide the following care/treatments:  PT OT     Incentive spirometry RT   Complete by: As directed       Allergies as of 09/10/2021  Reactions   Phenobarbital Hives, Shortness Of Breath, Swelling   Phenytoin Anaphylaxis, Hives, Swelling        Medication List     STOP taking these medications    cyclobenzaprine 10 MG  tablet Commonly known as: FLEXERIL       TAKE these medications    albuterol (2.5 MG/3ML) 0.083% nebulizer solution Commonly known as: PROVENTIL Take 3 mLs (2.5 mg total) by nebulization every 6 (six) hours.   albuterol 108 (90 Base) MCG/ACT inhaler Commonly known as: VENTOLIN HFA Inhale 2 puffs into the lungs every 6 (six) hours as needed for wheezing or shortness of breath.   ALPRAZolam 0.25 MG tablet Commonly known as: XANAX Take 1 tablet (0.25 mg total) by mouth 2 (two) times daily as needed.   aspirin EC 81 MG tablet Take 81 mg by mouth daily.   Biotin 1 MG Caps Take 1 tablet by mouth 2 (two) times daily. Viviscal   CAL-MAG-ZINC PO Take 1 tablet by mouth daily.   citalopram 40 MG tablet Commonly known as: CELEXA TAKE 1 TABLET AT BEDTIME   clopidogrel 75 MG tablet Commonly known as: PLAVIX TAKE 1 TABLET DAILY   diclofenac Sodium 1 % Gel Commonly known as: Voltaren Voltaren 1 % topical gel  APPLY 2 GRAM TO THE AFFECTED AREA(S) BY TOPICAL ROUTE 4 TIMES PER DAY What changed:  how much to take when to take this reasons to take this additional instructions   Fiber 28.3 % Powd Take 10 mLs by mouth 2 (two) times daily.   Fish Oil 1000 MG Caps Take 2,000 mg by mouth 2 (two) times daily.   fluticasone 50 MCG/ACT nasal spray Commonly known as: FLONASE Place 2 sprays into both nostrils daily as needed (allergies or post-nasl drip).   levETIRAcetam 500 MG tablet Commonly known as: KEPPRA Take 1 tablet (500 mg total) by mouth every 12 (twelve) hours.   levothyroxine 50 MCG tablet Commonly known as: SYNTHROID TAKE 1 TABLET ON Saturday and Sunday   levothyroxine 75 MCG tablet Commonly known as: SYNTHROID TAKE 1 TABLET IN THE MORNING BEFORE BREAKFAST ON Monday through Firday (TAKE 50 MCG ON OTHER DAYS)   methocarbamol 500 MG tablet Commonly known as: Robaxin Take 1 tablet (500 mg total) by mouth 4 (four) times daily.   NONFORMULARY OR COMPOUNDED ITEM Apply  240 mg topically daily as needed.   oxyCODONE-acetaminophen 5-325 MG tablet Commonly known as: Percocet Take 1-2 tablets by mouth every 4 (four) hours as needed for severe pain.   PRESERVISION/LUTEIN PO Take 1 tablet by mouth daily.   rosuvastatin 20 MG tablet Commonly known as: CRESTOR Take 1 tablet (20 mg total) by mouth daily. What changed: when to take this   Systane 0.4-0.3 % Soln Generic drug: Polyethyl Glycol-Propyl Glycol Place 1 drop into both eyes daily as needed (dry eyes).   telmisartan 20 MG tablet Commonly known as: MICARDIS TAKE 1 TABLET DAILY   vitamin C 500 MG tablet Commonly known as: ASCORBIC ACID Take 1,000 mg by mouth at bedtime. Rose hips   Vitamin D-3 25 MCG (1000 UT) Caps Take 2,000 Units by mouth at bedtime.               Durable Medical Equipment  (From admission, onward)           Start     Ordered   09/10/21 1007  For home use only DME Walker platform  Once       Comments: RIGHT AND LEFT  PLATFORM  Question:  Patient needs a walker to treat with the following condition  Answer:  Spinal stenosis in cervical region   09/10/21 1007            Follow-up Information     Dawley, Troy C, DO Follow up.   Why: As needed, If symptoms worsen Contact information: [address] [place] [address] [phone]               Resume Plavix on 09/19/2021  Signed: Marvis Moeller, DNP, NP-C 09/10/2021, 10:24 AM

## 2021-09-10 NOTE — Progress Notes (Signed)
Patient alert and oriented, mae's well, voiding adequate amount of urine, swallowing without difficulty, no c/o pain at time of discharge. Patient discharged home with family. Script and discharged instructions given to patient. Patient and family stated understanding of instructions given. Patient has an appointment with Dr Dawley in 2 weeks 

## 2021-09-10 NOTE — TOC Transition Note (Signed)
Transition of Care [facility]) - CM/SW Discharge Note   Patient Details  Name: [name] MRN: [id number] Date of Birth: 05-18-1956  Transition of Care [facility]) CM/SW Contact:  Verdell Carmine, RN Phone Number: 09/10/2021, 10:27 AM   Clinical Narrative:    Spoke to patient and husband via phone. Discussed HH and DME. They chose [facility] for home health. Platform walker will be received in room by staff. [facility] [place] notified.    Final next level of care: Home w Home Health Services Barriers to Discharge: No Barriers Identified   Patient Goals and CMS Choice        Discharge Placement  Home with DME and [place]                     Discharge Plan and Services                          HH Arranged: PT, OT [place] Agency: [place] ([facility]) Date HH Agency Contacted: 09/10/21 Time [place]: 1027 Representative spoke with at [place]: [place] ([place]) Interventions     Readmission Risk Interventions No flowsheet data found.

## 2021-09-10 NOTE — Discharge Instructions (Signed)
Wound Care Leave incision open to air. You may shower. Do not scrub directly on incision.  Do not put any creams, lotions, or ointments on incision.  Activity Walk each and every day, increasing distance each day. No lifting greater than 5 lbs.  Avoid excessive neck motion. No driving for 2 weeks; may ride as a passenger locally. Wear neck brace at all times except when showering.  Diet Resume your normal diet.  . Call Your Doctor If Any of These Occur Redness, drainage, or swelling at the wound.  Temperature greater than 101 degrees. Severe pain not relieved by pain medication. Increased difficulty swallowing. Incision starts to come apart.  Follow Up Appt Call today for appointment in 2 weeks [phone]) or for problems.  If you have any hardware placed in your spine, you will need an x-ray before your appointment.

## 2021-09-12 DIAGNOSIS — M4712 Other spondylosis with myelopathy, cervical region: Secondary | ICD-10-CM | POA: Diagnosis not present

## 2021-09-12 DIAGNOSIS — J449 Chronic obstructive pulmonary disease, unspecified: Secondary | ICD-10-CM | POA: Diagnosis not present

## 2021-09-12 DIAGNOSIS — I69311 Memory deficit following cerebral infarction: Secondary | ICD-10-CM | POA: Diagnosis not present

## 2021-09-12 DIAGNOSIS — Z7951 Long term (current) use of inhaled steroids: Secondary | ICD-10-CM | POA: Diagnosis not present

## 2021-09-12 DIAGNOSIS — Z9181 History of falling: Secondary | ICD-10-CM | POA: Diagnosis not present

## 2021-09-12 DIAGNOSIS — G4089 Other seizures: Secondary | ICD-10-CM | POA: Diagnosis not present

## 2021-09-12 DIAGNOSIS — M50121 Cervical disc disorder at C4-C5 level with radiculopathy: Secondary | ICD-10-CM | POA: Diagnosis not present

## 2021-09-12 DIAGNOSIS — Z9981 Dependence on supplemental oxygen: Secondary | ICD-10-CM | POA: Diagnosis not present

## 2021-09-12 DIAGNOSIS — Z79891 Long term (current) use of opiate analgesic: Secondary | ICD-10-CM | POA: Diagnosis not present

## 2021-09-12 DIAGNOSIS — G5602 Carpal tunnel syndrome, left upper limb: Secondary | ICD-10-CM | POA: Diagnosis not present

## 2021-09-12 DIAGNOSIS — E7800 Pure hypercholesterolemia, unspecified: Secondary | ICD-10-CM | POA: Diagnosis not present

## 2021-09-12 DIAGNOSIS — H3530 Unspecified macular degeneration: Secondary | ICD-10-CM | POA: Diagnosis not present

## 2021-09-12 DIAGNOSIS — Z7982 Long term (current) use of aspirin: Secondary | ICD-10-CM | POA: Diagnosis not present

## 2021-09-12 DIAGNOSIS — I119 Hypertensive heart disease without heart failure: Secondary | ICD-10-CM | POA: Diagnosis not present

## 2021-09-12 DIAGNOSIS — R42 Dizziness and giddiness: Secondary | ICD-10-CM | POA: Diagnosis not present

## 2021-09-12 DIAGNOSIS — M26609 Unspecified temporomandibular joint disorder, unspecified side: Secondary | ICD-10-CM | POA: Diagnosis not present

## 2021-09-12 DIAGNOSIS — I69398 Other sequelae of cerebral infarction: Secondary | ICD-10-CM | POA: Diagnosis not present

## 2021-09-12 DIAGNOSIS — Z4789 Encounter for other orthopedic aftercare: Secondary | ICD-10-CM | POA: Diagnosis not present

## 2021-09-12 DIAGNOSIS — Z87891 Personal history of nicotine dependence: Secondary | ICD-10-CM | POA: Diagnosis not present

## 2021-09-12 DIAGNOSIS — I69351 Hemiplegia and hemiparesis following cerebral infarction affecting right dominant side: Secondary | ICD-10-CM | POA: Diagnosis not present

## 2021-09-12 DIAGNOSIS — E039 Hypothyroidism, unspecified: Secondary | ICD-10-CM | POA: Diagnosis not present

## 2021-09-12 DIAGNOSIS — K219 Gastro-esophageal reflux disease without esophagitis: Secondary | ICD-10-CM | POA: Diagnosis not present

## 2021-09-12 DIAGNOSIS — F339 Major depressive disorder, recurrent, unspecified: Secondary | ICD-10-CM | POA: Diagnosis not present

## 2021-09-12 DIAGNOSIS — F419 Anxiety disorder, unspecified: Secondary | ICD-10-CM | POA: Diagnosis not present

## 2021-09-12 DIAGNOSIS — E559 Vitamin D deficiency, unspecified: Secondary | ICD-10-CM | POA: Diagnosis not present

## 2021-09-12 NOTE — Op Note (Signed)
Providing Compassionate, Quality Care - Together  Date of service: 09/09/2021  PREOP DIAGNOSIS: Cervical spondylosis with myeloradiculopathy, C4-5, C5-6; Carpal tunnel syndrome - left  POSTOP DIAGNOSIS: Same  PROCEDURE: 1. Arthrodesis C4-5, C5-6, anterior interbody technique  2. Placement of intervertebral biomechanical device C4-5, C5-6, K2M cascadia titanium interbody 61mm C4-5, 54mm C5-6 3. Placement of anterior instrumentation consisting of interbody plate and screws - H8N ozark plate, 81mm screws 4. Discectomy at C4-5, C5-6 for decompression of spinal cord and exiting nerve roots  5. Use of morselized bone allograft  6. Use of autograft, same incision 7. Left carpal tunnel release, open 6. Use of intraoperative microscope  SURGEON: Dr. Pieter Partridge Ivon Oelkers, DO  ASSISTANT: Charolette Child, NP  ANESTHESIA: General Endotracheal  EBL: 50 cc  SPECIMENS: None  DRAINS: None  COMPLICATIONS: None immediate  CONDITION: Hemodynamically stable to PACU  HISTORY: [name] is a 66 y.o. y.o. female who initially presented to the outpatient clinic with signs and symptoms consistent with myelopathy and radiculopathy, C5 and C6. MRI demonstrated central canal stenosis at C 4-5, C5-6 with severe foraminal narrowing as well.  EMG revealed left upper extremity severe carpal tunnel syndrome.  She had a positive Tinel's on the left at the wrist. Treatment options were discussed including physical therapy, pain control, epidural steroid injections, however given her symptoms and signs of myelopathy with a positive Hoffmann's and cervical stenosis I recommended surgical intervention in the form of a C4-6 ACDF and simultaneous left carpal tunnel release. After all questions were answered, informed consent was obtained.  PROCEDURE IN DETAIL: The patient was brought to the operating room and transferred to the operative table. After induction of general anesthesia, the patient was positioned on the  operative table in the supine position with all pressure points meticulously padded. The skin of the neck was then prepped and draped in the usual sterile fashion.  Physician driven timeout was performed.  After timeout was conducted, skin incision was then made sharply with a 10 blade and Bovie electrocautery was used to dissect the subcutaneous tissue until the platysma was identified. The platysma was then divided and undermined. The sternocleidomastoid muscle was then identified and, utilizing natural fascial planes in the neck, the prevertebral fascia was identified and the carotid sheath was retracted laterally and the trachea and esophagus retracted medially. Again using fluoroscopy, the correct disc space was identified. Bovie electrocautery was used to dissect in the subperiosteal plane and elevate the bilateral longus coli muscles. Self-retaining retractors were then placed under the longus coli muscles bilaterally. At this point, the microscope was draped and brought into the field, and the remainder of the case was done under the microscope using microdissecting technique.  Anterior cervical discectomy and fusion C4-5: Distraction pins were placed in midline above and below the disc space.  The disc  space was placed in distraction.  The disc space was incised sharply and rongeurs were use to initially complete a discectomy. The high-speed drill was then used to complete discectomy until the posterior annulus was identified and removed and the posterior longitudinal ligament was identified. Using microcurettes, the PLL was elevated, and Kerrison rongeurs were used to remove the posterior longitudinal ligament and the ventral thecal sac was identified. Using a combination of curettes and ronguers, complete decompression of the thecal sac and exiting nerve roots at this level was completed, and verified using micro-nerve hook. The disc space was taken out of distraction.  There was significant  posterior osteophytosis and broad-based disc  herniation at this level.  Using a high-speed drill, autograft was collected and decorticated along the uncovertebral joints and packed laterally.  Epidural hemostasis was achieved with Surgiflo. Having completed our decompression, attention was turned to placement of the intervertebral device. Trial spacers were used to select a 7 mm graft. This graft was then filled with morcellized allograft and autograft, and inserted under live fluoroscopy.  Distraction pins were removed, hemostasis was achieved with Surgiflo.  Anterior cervical discectomy and fusion C5-6: Distraction pins were placed in midline above and below the disc space.  The disc  space was placed in distraction.  The disc space was incised sharply and rongeurs were use to initially complete a discectomy. The high-speed drill was then used to complete discectomy until the posterior annulus was identified and removed and the posterior longitudinal ligament was identified. Using microcurettes, the PLL was elevated, and Kerrison rongeurs were used to remove the posterior longitudinal ligament and the ventral thecal sac was identified. Using a combination of curettes and ronguers, complete decompression of the thecal sac and exiting nerve roots at this level was completed, and verified using micro-nerve hook. The disc space was taken out of distraction.  There was significant posterior osteophytosis and broad-based disc herniation at this level as well.  Using a high-speed drill, autograft was collected and decorticated along the uncovertebral joints and packed laterally.  Epidural hemostasis was achieved with Surgiflo. Having completed our decompression, attention was turned to placement of the intervertebral device. Trial spacers were used to select a 6 mm graft. This graft was then filled with morcellized allograft and autograft, and inserted under live fluoroscopy.  Distraction pins were removed, hemostasis  was achieved with Surgiflo.  After placement of the intervertebral device, the above anterior cervical plate was selected, and placed across the interspace. Using a high-speed drill, the cortex of the cervical vertebral bodies was punctured, and screws inserted in the level of C4, C5, C6. Final fluoroscopic images in AP and lateral projections were taken to confirm good hardware placement.  The plate was final tightened to the manufacturer's recommendation and the screws were locked in place at each level.  At this point, after all counts were verified to be correct, meticulous hemostasis was secured using a combination of bipolar electrocautery and passive hemostatics.  The wound was closed in layers, platysma with 2-0 Vicryl sutures, dermis with 3-0 Vicryl, skin with skin glue.  Sterile dressing was applied.  At this point the microscope and back table remained sterile, all counts were correct x2.  Drapes were taken down from the cervical spine.  The left hand was then placed in extension and the carpal tunnel incision was marked.  This was sterilely prepped and draped in a normal fashion.  Repeat physician driven timeout was performed.  Local anesthetic was injected into the planned incision.  Microscope was brought into the field for the entirety of the procedure for microdissection.  Using a 15 blade, incision was made over the carpal tunnel.  Self-retaining retractor was placed and removed.  The palmar aponeurosis was incised sharply with a 15 blade.  This was spread bilaterally.  Transverse carpal ligament was identified and opened sharply with a 15 blade.  Median nerve was identified.  This was extended distally until distal palmar fat was identified.  Using a Valora Corporal, proximal dissection was performed and the transverse carpal ligament was followed proximally with Metzenbaum scissors and cut sharply.  The median nerve was intact and noted to be decompressed through its  entire course in the wrist and  hand.  Using a Valora Corporal again proximal palpation was noted to be decompressed, and usually I could see the carpal tunnel ligament open distally as well.  Self-retaining retractor was taken in the wound.  Hemostasis was achieved with bipolar cautery.  The wound was closed with vertical mattress 3-0 monofilament suture interrupted fashion.  Sterile dressing was applied.  The patient tolerated the procedure well and was extubated in the room and taken to the postanesthesia care unit in stable condition.

## 2021-09-13 DIAGNOSIS — I69311 Memory deficit following cerebral infarction: Secondary | ICD-10-CM | POA: Diagnosis not present

## 2021-09-13 DIAGNOSIS — M50121 Cervical disc disorder at C4-C5 level with radiculopathy: Secondary | ICD-10-CM | POA: Diagnosis not present

## 2021-09-13 DIAGNOSIS — M4712 Other spondylosis with myelopathy, cervical region: Secondary | ICD-10-CM | POA: Diagnosis not present

## 2021-09-13 DIAGNOSIS — Z4789 Encounter for other orthopedic aftercare: Secondary | ICD-10-CM | POA: Diagnosis not present

## 2021-09-13 DIAGNOSIS — I69351 Hemiplegia and hemiparesis following cerebral infarction affecting right dominant side: Secondary | ICD-10-CM | POA: Diagnosis not present

## 2021-09-13 DIAGNOSIS — G5602 Carpal tunnel syndrome, left upper limb: Secondary | ICD-10-CM | POA: Diagnosis not present

## 2021-09-16 DIAGNOSIS — I69311 Memory deficit following cerebral infarction: Secondary | ICD-10-CM | POA: Diagnosis not present

## 2021-09-16 DIAGNOSIS — G5602 Carpal tunnel syndrome, left upper limb: Secondary | ICD-10-CM | POA: Diagnosis not present

## 2021-09-16 DIAGNOSIS — M4712 Other spondylosis with myelopathy, cervical region: Secondary | ICD-10-CM | POA: Diagnosis not present

## 2021-09-16 DIAGNOSIS — M50121 Cervical disc disorder at C4-C5 level with radiculopathy: Secondary | ICD-10-CM | POA: Diagnosis not present

## 2021-09-16 DIAGNOSIS — I69351 Hemiplegia and hemiparesis following cerebral infarction affecting right dominant side: Secondary | ICD-10-CM | POA: Diagnosis not present

## 2021-09-16 DIAGNOSIS — Z4789 Encounter for other orthopedic aftercare: Secondary | ICD-10-CM | POA: Diagnosis not present

## 2021-09-20 DIAGNOSIS — M50121 Cervical disc disorder at C4-C5 level with radiculopathy: Secondary | ICD-10-CM | POA: Diagnosis not present

## 2021-09-20 DIAGNOSIS — G5602 Carpal tunnel syndrome, left upper limb: Secondary | ICD-10-CM | POA: Diagnosis not present

## 2021-09-20 DIAGNOSIS — I69311 Memory deficit following cerebral infarction: Secondary | ICD-10-CM | POA: Diagnosis not present

## 2021-09-20 DIAGNOSIS — Z4789 Encounter for other orthopedic aftercare: Secondary | ICD-10-CM | POA: Diagnosis not present

## 2021-09-20 DIAGNOSIS — I69351 Hemiplegia and hemiparesis following cerebral infarction affecting right dominant side: Secondary | ICD-10-CM | POA: Diagnosis not present

## 2021-09-20 DIAGNOSIS — M4712 Other spondylosis with myelopathy, cervical region: Secondary | ICD-10-CM | POA: Diagnosis not present

## 2021-09-22 ENCOUNTER — Ambulatory Visit (INDEPENDENT_AMBULATORY_CARE_PROVIDER_SITE_OTHER): Payer: Medicare Other

## 2021-09-22 VITALS — Ht 62.0 in | Wt 163.0 lb

## 2021-09-22 DIAGNOSIS — I69311 Memory deficit following cerebral infarction: Secondary | ICD-10-CM | POA: Diagnosis not present

## 2021-09-22 DIAGNOSIS — M50121 Cervical disc disorder at C4-C5 level with radiculopathy: Secondary | ICD-10-CM | POA: Diagnosis not present

## 2021-09-22 DIAGNOSIS — M4712 Other spondylosis with myelopathy, cervical region: Secondary | ICD-10-CM | POA: Diagnosis not present

## 2021-09-22 DIAGNOSIS — Z4789 Encounter for other orthopedic aftercare: Secondary | ICD-10-CM | POA: Diagnosis not present

## 2021-09-22 DIAGNOSIS — I69351 Hemiplegia and hemiparesis following cerebral infarction affecting right dominant side: Secondary | ICD-10-CM | POA: Diagnosis not present

## 2021-09-22 DIAGNOSIS — G5602 Carpal tunnel syndrome, left upper limb: Secondary | ICD-10-CM | POA: Diagnosis not present

## 2021-09-22 DIAGNOSIS — Z0000 Encounter for general adult medical examination without abnormal findings: Secondary | ICD-10-CM | POA: Diagnosis not present

## 2021-09-22 NOTE — Patient Instructions (Signed)
[name] , Thank you for taking time to come for your Medicare Wellness Visit. I appreciate your ongoing commitment to your health goals. Please review the following plan we discussed and let me know if I can assist you in the future.   Screening recommendations/referrals: Colonoscopy: completed 08/18/2016 Mammogram: completed 06/30/2021 Bone Density: completed 05/01/2018 Recommended yearly ophthalmology/optometry visit for glaucoma screening and checkup Recommended yearly dental visit for hygiene and checkup  Vaccinations: Influenza vaccine: completed per patient Pneumococcal vaccine: completed 03/16/2021 Tdap vaccine: completed 03/06/2012, due 03/06/2022 Shingles vaccine: completed  Covid-19:  06/17/2020, 12/13/2019, 11/22/2019  Advanced directives: Please bring a copy of your POA (Power of Attorney) and/or Living Will to your next appointment.   Conditions/risks identified: none  Next appointment: Follow up in one year for your annual wellness visit.   Preventive Care 40-64 Years, Female Preventive care refers to lifestyle choices and visits with your health care provider that can promote health and wellness. What does preventive care include? A yearly physical exam. This is also called an annual well check. Dental exams once or twice a year. Routine eye exams. Ask your health care provider how often you should have your eyes checked. Personal lifestyle choices, including: Daily care of your teeth and gums. Regular physical activity. Eating a healthy diet. Avoiding tobacco and drug use. Limiting alcohol use. Practicing safe sex. Taking low-dose aspirin daily starting at age 2. Taking vitamin and mineral supplements as recommended by your health care provider. What happens during an annual well check? The services and screenings done by your health care provider during your annual well check will depend on your age, overall health, lifestyle risk factors, and family history  of disease. Counseling  Your health care provider may ask you questions about your: Alcohol use. Tobacco use. Drug use. Emotional well-being. Home and relationship well-being. Sexual activity. Eating habits. Work and work Statistician. Method of birth control. Menstrual cycle. Pregnancy history. Screening  You may have the following tests or measurements: Height, weight, and BMI. Blood pressure. Lipid and cholesterol levels. These may be checked every 5 years, or more frequently if you are over 54 years old. Skin check. Lung cancer screening. You may have this screening every year starting at age 75 if you have a 30-pack-year history of smoking and currently smoke or have quit within the past 15 years. Fecal occult blood test (FOBT) of the stool. You may have this test every year starting at age 56. Flexible sigmoidoscopy or colonoscopy. You may have a sigmoidoscopy every 5 years or a colonoscopy every 10 years starting at age 62. Hepatitis C blood test. Hepatitis B blood test. Sexually transmitted disease (STD) testing. Diabetes screening. This is done by checking your blood sugar (glucose) after you have not eaten for a while (fasting). You may have this done every 1-3 years. Mammogram. This may be done every 1-2 years. Talk to your health care provider about when you should start having regular mammograms. This may depend on whether you have a family history of breast cancer. BRCA-related cancer screening. This may be done if you have a family history of breast, ovarian, tubal, or peritoneal cancers. Pelvic exam and Pap test. This may be done every 3 years starting at age 34. Starting at age 23, this may be done every 5 years if you have a Pap test in combination with an HPV test. Bone density scan. This is done to screen for osteoporosis. You may have this scan if you are at high risk  for osteoporosis. Discuss your test results, treatment options, and if necessary, the need for more  tests with your health care provider. Vaccines  Your health care provider may recommend certain vaccines, such as: Influenza vaccine. This is recommended every year. Tetanus, diphtheria, and acellular pertussis (Tdap, Td) vaccine. You may need a Td booster every 10 years. Zoster vaccine. You may need this after age 65. Pneumococcal 13-valent conjugate (PCV13) vaccine. You may need this if you have certain conditions and were not previously vaccinated. Pneumococcal polysaccharide (PPSV23) vaccine. You may need one or two doses if you smoke cigarettes or if you have certain conditions. Talk to your health care provider about which screenings and vaccines you need and how often you need them. This information is not intended to replace advice given to you by your health care provider. Make sure you discuss any questions you have with your health care provider. Document Released: 09/18/2015 Document Revised: 05/11/2016 Document Reviewed: 06/23/2015 Elsevier Interactive Patient Education  2017 [employer] Prevention in the Home Falls can cause injuries. They can happen to people of all ages. There are many things you can do to make your home safe and to help prevent falls. What can I do on the outside of my home? Regularly fix the edges of walkways and driveways and fix any cracks. Remove anything that might make you trip as you walk through a door, such as a raised step or threshold. Trim any bushes or trees on the path to your home. Use bright outdoor lighting. Clear any walking paths of anything that might make someone trip, such as rocks or tools. Regularly check to see if handrails are loose or broken. Make sure that both sides of any steps have handrails. Any raised decks and porches should have guardrails on the edges. Have any leaves, snow, or ice cleared regularly. Use sand or salt on walking paths during winter. Clean up any spills in your garage right away. This includes  oil or grease spills. What can I do in the bathroom? Use night lights. Install grab bars by the toilet and in the tub and shower. Do not use towel bars as grab bars. Use non-skid mats or decals in the tub or shower. If you need to sit down in the shower, use a plastic, non-slip stool. Keep the floor dry. Clean up any water that spills on the floor as soon as it happens. Remove soap buildup in the tub or shower regularly. Attach bath mats securely with double-sided non-slip rug tape. Do not have throw rugs and other things on the floor that can make you trip. What can I do in the bedroom? Use night lights. Make sure that you have a light by your bed that is easy to reach. Do not use any sheets or blankets that are too big for your bed. They should not hang down onto the floor. Have a firm chair that has side arms. You can use this for support while you get dressed. Do not have throw rugs and other things on the floor that can make you trip. What can I do in the kitchen? Clean up any spills right away. Avoid walking on wet floors. Keep items that you use a lot in easy-to-reach places. If you need to reach something above you, use a strong step stool that has a grab bar. Keep electrical cords out of the way. Do not use floor polish or wax that makes floors slippery. If you must  use wax, use non-skid floor wax. Do not have throw rugs and other things on the floor that can make you trip. What can I do with my stairs? Do not leave any items on the stairs. Make sure that there are handrails on both sides of the stairs and use them. Fix handrails that are broken or loose. Make sure that handrails are as long as the stairways. Check any carpeting to make sure that it is firmly attached to the stairs. Fix any carpet that is loose or worn. Avoid having throw rugs at the top or bottom of the stairs. If you do have throw rugs, attach them to the floor with carpet tape. Make sure that you have a light  switch at the top of the stairs and the bottom of the stairs. If you do not have them, ask someone to add them for you. What else can I do to help prevent falls? Wear shoes that: Do not have high heels. Have rubber bottoms. Are comfortable and fit you well. Are closed at the toe. Do not wear sandals. If you use a stepladder: Make sure that it is fully opened. Do not climb a closed stepladder. Make sure that both sides of the stepladder are locked into place. Ask someone to hold it for you, if possible. Clearly mark and make sure that you can see: Any grab bars or handrails. First and last steps. Where the edge of each step is. Use tools that help you move around (mobility aids) if they are needed. These include: Canes. Walkers. Scooters. Crutches. Turn on the lights when you go into a dark area. Replace any light bulbs as soon as they burn out. Set up your furniture so you have a clear path. Avoid moving your furniture around. If any of your floors are uneven, fix them. If there are any pets around you, be aware of where they are. Review your medicines with your doctor. Some medicines can make you feel dizzy. This can increase your chance of falling. Ask your doctor what other things that you can do to help prevent falls. This information is not intended to replace advice given to you by your health care provider. Make sure you discuss any questions you have with your health care provider. Document Released: 06/18/2009 Document Revised: 01/28/2016 Document Reviewed: 09/26/2014 Elsevier Interactive Patient Education  2017 [employer].

## 2021-09-22 NOTE — Progress Notes (Signed)
I connected with  [name] today via telehealth video enabled device and verified that I am speaking with the correct person using two identifiers.   Location: Patient: home Provider: work  Persons participating in virtual visit: [place] Information systems manager, Glenna Durand LPN  I discussed the limitations, risks, security and privacy concerns of performing an evaluation and management service by video and the availability of in person appointments. The patient expressed understanding and agreed to proceed.   Some vital signs may be absent or patient reported.     Subjective:   [name] is a 66 y.o. female who presents for Medicare Annual (Subsequent) preventive examination.  Review of Systems     Cardiac Risk Factors include: none     Objective:    Today's Vitals   09/22/21 1358 09/22/21 1359  Weight: 163 lb (73.9 kg)   Height: 5\' 2"  (1.575 m)   PainSc:  8    Body mass index is 29.81 kg/m.  Advanced Directives 09/22/2021 09/02/2021 04/08/2021 10/16/2020 09/16/2020 09/11/2019 09/03/2018  Does Patient Have a Medical Advance Directive? Yes Yes Yes Yes Yes Yes No  Type of Paramedic of [place];Living will [place];Living will [place];Living will [place];Living will [place];Living will [place];Living will -  Does patient want to make changes to medical advance directive? - - No - Patient declined - - - -  Copy of [place] in Chart? No - copy requested No - copy requested - No - copy requested No - copy requested No - copy requested -  Would patient like information on creating a medical advance directive? - - - - - - Yes (MAU/Ambulatory/Procedural Areas - Information given)  Pre-existing out of facility DNR order (yellow form or pink MOST form) - - - - - - -    Current Medications (verified) Outpatient Encounter Medications  as of 09/22/2021  Medication Sig   albuterol (PROVENTIL) (2.5 MG/3ML) 0.083% nebulizer solution Take 3 mLs (2.5 mg total) by nebulization every 6 (six) hours.   albuterol (VENTOLIN HFA) 108 (90 Base) MCG/ACT inhaler Inhale 2 puffs into the lungs every 6 (six) hours as needed for wheezing or shortness of breath.   ALPRAZolam (XANAX) 0.25 MG tablet Take 1 tablet (0.25 mg total) by mouth 2 (two) times daily as needed.   aspirin EC 81 MG tablet Take 81 mg by mouth daily.   Biotin 1 MG CAPS Take 1 tablet by mouth 2 (two) times daily. Viviscal   Calcium-Magnesium-Zinc (CAL-MAG-ZINC PO) Take 1 tablet by mouth daily.   Cholecalciferol (VITAMIN D-3) 1000 units CAPS Take 2,000 Units by mouth at bedtime.   citalopram (CELEXA) 40 MG tablet TAKE 1 TABLET AT BEDTIME   clopidogrel (PLAVIX) 75 MG tablet TAKE 1 TABLET DAILY   fluticasone (FLONASE) 50 MCG/ACT nasal spray Place 2 sprays into both nostrils daily as needed (allergies or post-nasl drip).    levETIRAcetam (KEPPRA) 500 MG tablet Take 1 tablet (500 mg total) by mouth every 12 (twelve) hours.   levothyroxine (SYNTHROID) 50 MCG tablet TAKE 1 TABLET ON Saturday and Sunday   levothyroxine (SYNTHROID) 75 MCG tablet TAKE 1 TABLET IN THE MORNING BEFORE BREAKFAST ON Monday through Firday (TAKE 50 MCG ON OTHER DAYS)   methocarbamol (ROBAXIN) 500 MG tablet Take 1 tablet (500 mg total) by mouth 4 (four) times daily.   Multiple Vitamins-Minerals (PRESERVISION/LUTEIN PO) Take 1 tablet by mouth daily.  Omega-3 Fatty Acids (FISH OIL) 1000 MG CAPS Take 2,000 mg by mouth 2 (two) times daily.    oxyCODONE-acetaminophen (PERCOCET) 5-325 MG tablet Take 1-2 tablets by mouth every 4 (four) hours as needed for severe pain.   Polyethyl Glycol-Propyl Glycol (SYSTANE) 0.4-0.3 % SOLN Place 1 drop into both eyes daily as needed (dry eyes).   Psyllium (FIBER) 28.3 % POWD Take 10 mLs by mouth 2 (two) times daily.   rosuvastatin (CRESTOR) 20 MG tablet Take 1 tablet (20 mg total) by  mouth daily. (Patient taking differently: Take 20 mg by mouth at bedtime.)   telmisartan (MICARDIS) 20 MG tablet TAKE 1 TABLET DAILY   vitamin C (ASCORBIC ACID) 500 MG tablet Take 1,000 mg by mouth at bedtime. Rose hips   diclofenac Sodium (VOLTAREN) 1 % GEL Voltaren 1 % topical gel  APPLY 2 GRAM TO THE AFFECTED AREA(S) BY TOPICAL ROUTE 4 TIMES PER DAY (Patient not taking: Reported on 09/22/2021)   NONFORMULARY OR COMPOUNDED ITEM Apply 240 mg topically daily as needed. (Patient not taking: Reported on 08/25/2021)   No facility-administered encounter medications on file as of 09/22/2021.    Allergies (verified) Phenobarbital and Phenytoin   History: Past Medical History:  Diagnosis Date   Abnormal gait    Amnesia    Anemia    hx   Aneurysm (HCC)    Anxiety    Asthma    Atrophic vaginitis    Benign hypertensive heart disease    Cardiomegaly    Chest pain    COPD (chronic obstructive pulmonary disease) (HCC)     - PFTs  01/24/06 FEV1 69% ratio 68% diffusing capacity 57% with no improvement after B2   - PFT's 10/1/ 09 FEV1 77   ratio  76    dlco                   45            no resp to B2    - Nl alpha one antitripsin level 10/09   Cough    Depression    Difficulty speaking    Disorder of nasal cavity    Dyspnea    Enthesopathy of elbow region    GERD (gastroesophageal reflux disease)    Hammer toe    Headache(784.0)    Hemiparesis affecting dominant side as late effect of cerebrovascular accident ([place])    History of kidney stones    Hyperlipidemia    Hypertension    Hypothyroidism    Increased urinary frequency    Low compliance bladder    Macular degeneration of both eyes    Malaise and fatigue    Nonruptured cerebral aneurysm    Obesity    Pneumonia    hx   Pure hypercholesterolemia    Recurrent major depressive episodes ([place])    Seizure disorder, secondary ([place])    2-3 focal siezures monthly   Seizures ([place])    Short-term memory loss    since stroke   Skin  sensation disturbance    Stroke [facility]) 2011   rt sided weakness   Swelling of limb    TMJ (temporomandibular joint disorder)    Urinary tract infectious disease    Vertigo as late effect of stroke    Vitamin D deficiency    Past Surgical History:  Procedure Laterality Date   ANTERIOR CERVICAL DECOMP/DISCECTOMY FUSION N/A 09/09/2021   Procedure: Anterior  Cervical Decompression Fusion  Cervical four-five, Cervical five-six;  Surgeon: Dawley,  Theodoro Doing, DO;  Location: [place];  Service: Neurosurgery;  Laterality: N/A;   BACK SURGERY     CARPAL TUNNEL RELEASE Left 09/09/2021   Procedure: LEFT CARPAL TUNNEL RELEASE;  Surgeon: Karsten Ro, DO;  Location: [place];  Service: Neurosurgery;  Laterality: Left;   CEREBRAL ANGIOGRAM     x3   COLONOSCOPY WITH PROPOFOL N/A 08/18/2016   Procedure: COLONOSCOPY WITH PROPOFOL;  Surgeon: Carol Ada, MD;  Location: [facility];  Service: Endoscopy;  Laterality: N/A;   LAPAROSCOPIC HYSTERECTOMY     MANDIBLE FRACTURE SURGERY     RADIOLOGY WITH ANESTHESIA N/A 08/21/2013   Procedure: RADIOLOGY WITH ANESTHESIA;  Surgeon: Rob Hickman, MD;  Location: [place];  Service: Radiology;  Laterality: N/A;   stones     no surgery   Family History  Problem Relation Age of Onset   Heart disease Mother    Heart disease Father    Heart attack Father    Cancer Father        prostate to lymph node   Lung cancer Other    Breast cancer Neg Hx    Social History   Socioeconomic History   Marital status: Married    Spouse name: Jori Moll   Number of children: 2   Years of education: 14   Highest education level: Not on file  Occupational History   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    Drug use: No   Sexual activity: Not Currently  Other Topics Concern   Not on file  Social History Narrative   Patient lives at home with her husband Jori Moll).   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    Patient is on disability.    Patient has an Associates degree.   Patient is right-handed.   Patient drinks 2 cups of green tea most days.               Social Determinants of Health   Financial Resource Strain: Low Risk    Difficulty of Paying Living Expenses: Not hard at all  Food Insecurity: No Food Insecurity   Worried About Charity fundraiser in the Last Year: Never true   [place] in the Last Year: Never true  Transportation Needs: No Transportation Needs   Lack of Transportation (Medical): No   Lack of Transportation (Non-Medical): No  Physical Activity: Inactive   Days of Exercise per Week: 0 days   Minutes of Exercise per Session: 0 min  Stress: Stress Concern Present   Feeling of Stress : To some extent  Social Connections: Not on file    Tobacco Counseling Counseling given: Not Answered Tobacco comments: smoked 2.5 ppd x 5 years, 1ppd x 20+ years   Clinical Intake:  Pre-visit preparation completed: Yes  Pain Score: 8  Pain Type: Chronic  pain Pain Location: Arm Pain Orientation: Left Pain Descriptors / Indicators: Shooting, Throbbing, Constant Pain Frequency: Constant     Nutritional Status: BMI 25 -29 Overweight Nutritional Risks: None Diabetes: Yes  How often do you need to have someone help you when you read instructions, pamphlets, or other written materials from your doctor or pharmacy?: 1 - Never  Diabetic? no  Interpreter Needed?: No  Information entered by :: NAllen LPN   Activities of Daily Living In  your present state of health, do you have any difficulty performing the following activities: 09/22/2021 09/02/2021  Hearing? N N  Vision? Y N  Comment at times -  Difficulty concentrating or making decisions? Tempie Donning  Walking or climbing stairs? Y Y  Dressing or bathing? Y Y  Doing errands, shopping? Y N  Preparing Food and eating ? Y -  Using the Toilet? Y -  In the past six months, have you accidently leaked urine? Y -  Do you have problems with loss of bowel control? N -  Managing your Medications? Y -  Comment family manages pain meds and muscle relaxers -  Managing your Finances? N -  Housekeeping or managing your Housekeeping? N -  Some recent data might be hidden    Patient Care Team: Glendale Chard, MD as PCP - General (Internal Medicine) Buford Dresser, MD as PCP - Cardiology (Cardiology) Rex Kras, Claudette Stapler, RN as [place] any recent Medical Services you may have received from other than [facility] providers in the past year (date may be approximate).     Assessment:   This is a routine wellness examination for [name].  Hearing/Vision screen Vision Screening - Comments:: Regular eye exams,  Dietary issues and exercise activities discussed: Current Exercise Habits: The patient does not participate in regular exercise at present   Goals Addressed             This Visit's Progress    Patient Stated       09/22/2021, recover from surgery       Depression Screen PHQ 2/9 Scores 09/22/2021 07/26/2021 03/16/2021 09/16/2020 06/05/2020 04/09/2020 03/11/2020  PHQ - 2 Score 0 1 3 0 0 1 0  PHQ- 9 Score - 13 9 10  - 4 8    Fall Risk Fall Risk  09/22/2021 09/16/2020 07/28/2020 06/05/2020 04/09/2020  Falls in the past year? 1 1 1  0 0  Comment lost balance and eye flutters loses balance - - -  Number falls in past yr: 1 1 1  0 1  Injury with Fall? 1 1 1  0 0  Comment - black eye, swollen hand head injury - -  Risk Factor Category  - - - - -   Risk for fall due to : Impaired balance/gait;Impaired mobility;History of fall(s);Medication side effect History of fall(s);Impaired balance/gait;Impaired mobility;Medication side effect - - -  Follow up Falls evaluation completed;Education provided;Falls prevention discussed Falls evaluation completed;Education provided;Falls prevention discussed - - -    FALL RISK PREVENTION PERTAINING TO THE HOME:  Any stairs in or around the home? Yes  If so, are there any without handrails? No  Home free of loose throw rugs in walkways, pet beds, electrical cords, etc? Yes  Adequate lighting in your home to reduce risk of falls? Yes   ASSISTIVE DEVICES UTILIZED TO PREVENT FALLS:  Life alert? No  Use of a cane, walker or w/c? Yes  Grab bars in the bathroom? No  Shower chair or bench in shower?  Yes  Elevated toilet seat or a handicapped toilet? No   TIMED UP AND GO:  Was the test performed? No .       Cognitive Function: MMSE - Mini Mental State Exam 09/03/2018 11/03/2015 05/05/2015 11/05/2014  Orientation to time 4 4 5 4   Orientation to Place 5 4 4 5   Registration 3 3 3 3   Attention/ Calculation 5 5 4 5   Recall 3 2 1 2   Language- name 2 objects 2 2 2 2   Language- repeat 1 1 1 1   Language- follow 3 step command 3 3 3 3   Language- read & follow direction 1 1 1 1   Write a sentence 1 1 1 1   Copy design 1 1 1 1   Total score 29 27 26 28      6CIT Screen 09/22/2021 09/16/2020 09/11/2019  What Year? 0 points 0 points 0 points  What month? 0 points 0 points 0 points  What time? 0 points 0 points 0 points  Count back from 20 0 points 0 points 0 points  Months in reverse 0 points 0 points 0 points  Repeat phrase 2 points 4 points 0 points  Total Score 2 4 0    Immunizations Immunization History  Administered Date(s) Administered   Influenza Whole 05/24/2008   Influenza,inj,Quad PF,6+ Mos 06/22/2017, 07/23/2018, 05/20/2019   Influenza-Unspecified 06/20/2018, 06/17/2020   PFIZER(Purple  Top)SARS-COV-2 Vaccination 11/22/2019, 12/13/2019, 06/17/2020   Pneumococcal Polysaccharide-23 03/16/2021   Td 05/24/2008   Zoster Recombinat (Shingrix) 06/22/2017, 09/01/2017    TDAP status: Up to date  Flu Vaccine status: Up to date  Pneumococcal vaccine status: Up to date  Covid-19 vaccine status: Completed vaccines  Qualifies for Shingles Vaccine? Yes   Zostavax completed No   Shingrix Completed?: Yes  Screening Tests Health Maintenance  Topic Date Due   COVID-19 Vaccine (4 - Booster for Pfizer series) 08/12/2020   INFLUENZA VACCINE  12/03/2021 (Originally 04/05/2021)   HIV Screening  03/16/2022 (Originally 11/08/1970)   TETANUS/TDAP  03/06/2022   Pneumococcal Vaccine 58-67 Years old (2 - PCV) 03/16/2022   MAMMOGRAM  07/01/2023   PAP SMEAR-Modifier  07/26/2024   COLONOSCOPY (Pts 45-4yrs Insurance coverage will need to be confirmed)  08/18/2026   Hepatitis C Screening  Completed   Zoster Vaccines- Shingrix  Completed   HPV VACCINES  Aged Out    Health Maintenance  Health Maintenance Due  Topic Date Due   COVID-19 Vaccine (4 - Booster for [place] series) 08/12/2020    Colorectal cancer screening: Type of screening: Colonoscopy. Completed 08/18/2016. Repeat every 10 years  Mammogram status: Completed 06/30/2021. Repeat every year  Bone Density status: Completed 05/01/2018.   Lung Cancer Screening: (Low Dose CT Chest recommended if Age 79-80 years, 30 pack-year currently smoking OR have quit w/in 15years.) does qualify.   Lung Cancer Screening Referral: CT scan 10/02/2020  Additional Screening:  Hepatitis C Screening: does qualify; Completed 09/07/2012  Vision Screening: Recommended annual ophthalmology exams for early detection of glaucoma and other disorders of the eye. Is the patient up to date with their annual eye exam?  Yes  Who is the provider or what is the name of the office in which the patient attends annual eye exams? Dr. Posey Pronto If pt is not established  with a provider, would they like to be referred to a provider to establish care? No .   Dental Screening: Recommended annual dental exams for proper oral hygiene  Community Resource Referral / Chronic Care Management: CRR required this  visit?  No   CCM required this visit?  No      Plan:     I have personally reviewed and noted the following in the patients chart:   Medical and social history Use of alcohol, tobacco or illicit drugs  Current medications and supplements including opioid prescriptions.  Functional ability and status Nutritional status Physical activity Advanced directives List of other physicians Hospitalizations, surgeries, and ER visits in previous 12 months Vitals Screenings to include cognitive, depression, and falls Referrals and appointments  In addition, I have reviewed and discussed with patient certain preventive protocols, quality metrics, and best practice recommendations. A written personalized care plan for preventive services as well as general preventive health recommendations were provided to patient.     Kellie Simmering, LPN   [id number]   Nurse Notes: none

## 2021-09-24 DIAGNOSIS — M4712 Other spondylosis with myelopathy, cervical region: Secondary | ICD-10-CM | POA: Diagnosis not present

## 2021-09-24 DIAGNOSIS — M50121 Cervical disc disorder at C4-C5 level with radiculopathy: Secondary | ICD-10-CM | POA: Diagnosis not present

## 2021-09-24 DIAGNOSIS — G5602 Carpal tunnel syndrome, left upper limb: Secondary | ICD-10-CM | POA: Diagnosis not present

## 2021-09-24 DIAGNOSIS — I69351 Hemiplegia and hemiparesis following cerebral infarction affecting right dominant side: Secondary | ICD-10-CM | POA: Diagnosis not present

## 2021-09-24 DIAGNOSIS — Z4789 Encounter for other orthopedic aftercare: Secondary | ICD-10-CM | POA: Diagnosis not present

## 2021-09-24 DIAGNOSIS — I69311 Memory deficit following cerebral infarction: Secondary | ICD-10-CM | POA: Diagnosis not present

## 2021-09-27 DIAGNOSIS — Z4789 Encounter for other orthopedic aftercare: Secondary | ICD-10-CM | POA: Diagnosis not present

## 2021-09-27 DIAGNOSIS — G5602 Carpal tunnel syndrome, left upper limb: Secondary | ICD-10-CM | POA: Diagnosis not present

## 2021-09-27 DIAGNOSIS — M4712 Other spondylosis with myelopathy, cervical region: Secondary | ICD-10-CM | POA: Diagnosis not present

## 2021-09-27 DIAGNOSIS — I69351 Hemiplegia and hemiparesis following cerebral infarction affecting right dominant side: Secondary | ICD-10-CM | POA: Diagnosis not present

## 2021-09-27 DIAGNOSIS — M50121 Cervical disc disorder at C4-C5 level with radiculopathy: Secondary | ICD-10-CM | POA: Diagnosis not present

## 2021-09-27 DIAGNOSIS — I69311 Memory deficit following cerebral infarction: Secondary | ICD-10-CM | POA: Diagnosis not present

## 2021-10-01 DIAGNOSIS — I69351 Hemiplegia and hemiparesis following cerebral infarction affecting right dominant side: Secondary | ICD-10-CM | POA: Diagnosis not present

## 2021-10-01 DIAGNOSIS — I69311 Memory deficit following cerebral infarction: Secondary | ICD-10-CM | POA: Diagnosis not present

## 2021-10-01 DIAGNOSIS — Z4789 Encounter for other orthopedic aftercare: Secondary | ICD-10-CM | POA: Diagnosis not present

## 2021-10-01 DIAGNOSIS — M50121 Cervical disc disorder at C4-C5 level with radiculopathy: Secondary | ICD-10-CM | POA: Diagnosis not present

## 2021-10-01 DIAGNOSIS — G5602 Carpal tunnel syndrome, left upper limb: Secondary | ICD-10-CM | POA: Diagnosis not present

## 2021-10-01 DIAGNOSIS — M4712 Other spondylosis with myelopathy, cervical region: Secondary | ICD-10-CM | POA: Diagnosis not present

## 2021-10-04 ENCOUNTER — Ambulatory Visit (INDEPENDENT_AMBULATORY_CARE_PROVIDER_SITE_OTHER): Payer: Medicare Other | Admitting: Adult Health

## 2021-10-04 ENCOUNTER — Encounter: Payer: Self-pay | Admitting: Adult Health

## 2021-10-04 VITALS — BP 122/70 | HR 90 | Ht 62.0 in | Wt 164.0 lb

## 2021-10-04 DIAGNOSIS — R252 Cramp and spasm: Secondary | ICD-10-CM | POA: Diagnosis not present

## 2021-10-04 DIAGNOSIS — R519 Headache, unspecified: Secondary | ICD-10-CM

## 2021-10-04 DIAGNOSIS — G8929 Other chronic pain: Secondary | ICD-10-CM | POA: Diagnosis not present

## 2021-10-04 DIAGNOSIS — G40909 Epilepsy, unspecified, not intractable, without status epilepticus: Secondary | ICD-10-CM

## 2021-10-04 DIAGNOSIS — R269 Unspecified abnormalities of gait and mobility: Secondary | ICD-10-CM

## 2021-10-04 DIAGNOSIS — I69398 Other sequelae of cerebral infarction: Secondary | ICD-10-CM

## 2021-10-04 DIAGNOSIS — Z8673 Personal history of transient ischemic attack (TIA), and cerebral infarction without residual deficits: Secondary | ICD-10-CM | POA: Diagnosis not present

## 2021-10-04 MED ORDER — LEVETIRACETAM 500 MG PO TABS: 500.0000 mg | ORAL_TABLET | Freq: Two times a day (BID) | ORAL | 3 refills | Status: DC

## 2021-10-04 NOTE — Patient Instructions (Addendum)
Continue Keppra 500 mg twice daily for seizure prevention  Continue clopidogrel 75 mg daily  and Crestor for secondary stroke prevention  Continue to follow up with PCP regarding cholesterol and blood pressure management  Maintain strict control of hypertension with blood pressure goal below 130/90 and cholesterol with LDL cholesterol (bad cholesterol) goal below 70 mg/dL.   Signs of a Stroke? Follow the BEFAST method:  Balance Watch for a sudden loss of balance, trouble with coordination or vertigo Eyes Is there a sudden loss of vision in one or both eyes? Or double vision?  Face: Ask the person to smile. Does one side of the face droop or is it numb?  Arms: Ask the person to raise both arms. Does one arm drift downward? Is there weakness or numbness of a leg? Speech: Ask the person to repeat a simple phrase. Does the speech sound slurred/strange? Is the person confused ? Time: If you observe any of these signs, call 911.    Specifically mentions Followup in the future with me in 1 year or call earlier if needed       Thank you for coming to see [place] at [facility]. I hope we have been able to provide you high quality care today.  You may receive a patient satisfaction survey over the next few weeks. We would appreciate your feedback and comments so that we may continue to improve ourselves and the health of our patients.  Continue

## 2021-10-04 NOTE — Progress Notes (Signed)
[facility]  PATIENT: [name] DOB: [birth year]   REASON FOR VISIT: Follow-up for nontraumatic intracerebral hemorrhage, abnormality of gait, right foot drop, complex partial seizure disorder, and vascular headaches   Chief complaint: Chief Complaint  Patient presents with   Follow-up    RM 3 with spouse ron Pt is well, has had a few falls since last visit, not sure of any sz, stable from stroke standpoint      HISTORY OF PRESENT ILLNESS:   [name] is a 66 y.o. 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  Returns today 10/04/2021 after prior visit 03/29/2021 accompanied by her husband   Stoke: Stable without new stroke/TIA symptoms and stable residual right spastic hemiparesis and gait impairment. Completed PT/OT back in 06/2021 for prior c/o slight decline in gait and balance which was beneficial. Did experience a few falls since prior visit, unsure what caused fall - fell backwards. Specifically mentions fall on xmas eve hit her head (no headache or deficits after fall) and xmas day but did not hit head.  Unsure if wasn't being as careful and moving too quickly around holidays. Previously placed order for scooter for long distance - per pt, DME company did not have outdoor scooter in [place], she did not follow up after this. Use of cane or RW for short distance. Compliant on Plavix and Crestor without side effects. Also remains on asa s/p intracranial stent per Dr. Estanislado Pandy recommendations.  Blood pressure today 122/70.    Seizures: Remains on Keppra 500 mg twice daily without associated side effects. Reports continued "eye fluttering" symptoms lasting only a few seconds which were reported at  prior visit - at times 2-3x per week and other times can go a few weeks without episode. Unsure if some confusion after but also reported feeling back to baseline after a few seconds. Typically occurs with head movement looking toward side. Has not had any episodes since surgery (see below)   Chronic migraines associated with tension headaches: No additional migraine headaches.     Cervicalgia with LUE radiculopathy: Completed MR cervical 04/2021 which showed moderate spinal stenosis C4-C5, severe spinal stenosis and moderately severe right foraminal narrowing and moderate left foraminal narrowing with potential R C6 nerve root compression. Referred to neurosurg - s/p ADCF C4/5 and L carpal tunnel release 09/09/2021. Reports complete resolution of left hand paresthesia, continued neck pain. Current use of neck brace. Started [facility] PT/OT. Feels like gait and right foot toe curling (post stroke) has worsened since procedure. Did have f/u with neurosurgery since procedure - has f/u again 2/22.      REVIEW OF SYSTEMS: Full 14 system review of systems performed and notable only for those listed, all others are neg:  Constitutional: neg Cardiovascular: neg Ear/Nose/Throat: neg  Skin: neg Eyes: neg Respiratory: neg Gastroitestinal: neg  Hematology/Lymphatic: neg Endocrine: neg Musculoskeletal: Walking difficulty, neck pain Allergy/Immunology: neg Neurological: Weakness, spasticity, headache, seizures Psychiatric: neg Sleep : neg   ALLERGIES: Allergies  Allergen Reactions   Phenobarbital Hives, Shortness Of Breath and Swelling   Phenytoin Anaphylaxis, Hives and Swelling    HOME MEDICATIONS: Outpatient Medications Prior to Visit  Medication Sig Dispense Refill   albuterol (PROVENTIL) (2.5 MG/3ML) 0.083% nebulizer solution Take 3 mLs (2.5 mg total) by nebulization every 6 (six) hours. 1080 mL  1   albuterol (VENTOLIN HFA) 108 (90 Base) MCG/ACT inhaler Inhale 2 puffs into the lungs every 6  (six) hours as needed for wheezing or shortness of breath. 24 g 3   ALPRAZolam (XANAX) 0.25 MG tablet Take 1 tablet (0.25 mg total) by mouth 2 (two) times daily as needed. 60 tablet 2   aspirin EC 81 MG tablet Take 81 mg by mouth daily.     Biotin 1 MG CAPS Take 1 tablet by mouth 2 (two) times daily. Viviscal     Calcium-Magnesium-Zinc (CAL-MAG-ZINC PO) Take 1 tablet by mouth daily.     Cholecalciferol (VITAMIN D-3) 1000 units CAPS Take 2,000 Units by mouth at bedtime.     citalopram (CELEXA) 40 MG tablet TAKE 1 TABLET AT BEDTIME 90 tablet 3   clopidogrel (PLAVIX) 75 MG tablet TAKE 1 TABLET DAILY 90 tablet 3   diclofenac Sodium (VOLTAREN) 1 % GEL Voltaren 1 % topical gel  APPLY 2 GRAM TO THE AFFECTED AREA(S) BY TOPICAL ROUTE 4 TIMES PER DAY 350 g 2   fluticasone (FLONASE) 50 MCG/ACT nasal spray Place 2 sprays into both nostrils daily as needed (allergies or post-nasl drip).   3   levothyroxine (SYNTHROID) 50 MCG tablet TAKE 1 TABLET ON Saturday and Sunday 39 tablet 3   levothyroxine (SYNTHROID) 75 MCG tablet TAKE 1 TABLET IN THE MORNING BEFORE BREAKFAST ON Monday through Firday (TAKE 50 MCG ON OTHER DAYS) 51 tablet 3   methocarbamol (ROBAXIN) 500 MG tablet Take 1 tablet (500 mg total) by mouth 4 (four) times daily. 90 tablet 1   Multiple Vitamins-Minerals (PRESERVISION/LUTEIN PO) Take 1 tablet by mouth daily.     NONFORMULARY OR COMPOUNDED ITEM Apply 240 mg topically daily as needed. 1 each 3   Omega-3 Fatty Acids (FISH OIL) 1000 MG CAPS Take 2,000 mg by mouth 2 (two) times daily.      oxyCODONE-acetaminophen (PERCOCET) 5-325 MG tablet Take 1-2 tablets by mouth every 4 (four) hours as needed for severe pain. 20 tablet 0   Polyethyl Glycol-Propyl Glycol (SYSTANE) 0.4-0.3 % SOLN Place 1 drop into both eyes daily as needed (dry eyes).     Psyllium (FIBER) 28.3 % POWD Take 10 mLs by mouth 2 (two) times daily.     rosuvastatin (CRESTOR) 20 MG tablet Take 1 tablet (20 mg total) by mouth daily. (Patient  taking differently: Take 20 mg by mouth at bedtime.) 90 tablet 3   telmisartan (MICARDIS) 20 MG tablet TAKE 1 TABLET DAILY 90 tablet 3   vitamin C (ASCORBIC ACID) 500 MG tablet Take 1,000 mg by mouth at bedtime. Rose hips     levETIRAcetam (KEPPRA) 500 MG tablet Take 1 tablet (500 mg total) by mouth every 12 (twelve) hours. 180 tablet 3   No facility-administered medications prior to visit.    PAST MEDICAL HISTORY: Past Medical History:  Diagnosis Date   Abnormal gait    Amnesia    Anemia    hx   Aneurysm (HCC)    Anxiety    Asthma    Atrophic vaginitis    Benign hypertensive heart disease    Cardiomegaly    Chest pain    COPD (chronic obstructive pulmonary disease) ([place])     - PFTs  01/24/06 FEV1 69% ratio 68% diffusing capacity 57% with no improvement after B2   - PFT's 10/1/ 09 FEV1 77   ratio  76    dlco  45            no resp to B2    - Nl alpha one antitripsin level 10/09   Cough    Depression    Difficulty speaking    Disorder of nasal cavity    Dyspnea    Enthesopathy of elbow region    GERD (gastroesophageal reflux disease)    Hammer toe    Headache(784.0)    Hemiparesis affecting dominant side as late effect of cerebrovascular accident [facility])    History of kidney stones    Hyperlipidemia    Hypertension    Hypothyroidism    Increased urinary frequency    Low compliance bladder    Macular degeneration of both eyes    Malaise and fatigue    Nonruptured cerebral aneurysm    Obesity    Pneumonia    hx   Pure hypercholesterolemia    Recurrent major depressive episodes ([place])    Seizure disorder, secondary ([place])    2-3 focal siezures monthly   Seizures (HCC)    Short-term memory loss    since stroke   Skin sensation disturbance    Stroke [facility]) 2011   rt sided weakness   Swelling of limb    TMJ (temporomandibular joint disorder)    Urinary tract infectious disease    Vertigo as late effect of stroke    Vitamin D deficiency     PAST  SURGICAL HISTORY: Past Surgical History:  Procedure Laterality Date   ANTERIOR CERVICAL DECOMP/DISCECTOMY FUSION N/A 09/09/2021   Procedure: Anterior  Cervical Decompression Fusion  Cervical four-five, Cervical five-six;  Surgeon: Karsten Ro, DO;  Location: [place];  Service: Neurosurgery;  Laterality: N/A;   BACK SURGERY     CARPAL TUNNEL RELEASE Left 09/09/2021   Procedure: LEFT CARPAL TUNNEL RELEASE;  Surgeon: Karsten Ro, DO;  Location: [place];  Service: Neurosurgery;  Laterality: Left;   CEREBRAL ANGIOGRAM     x3   COLONOSCOPY WITH PROPOFOL N/A 08/18/2016   Procedure: COLONOSCOPY WITH PROPOFOL;  Surgeon: Carol Ada, MD;  Location: [facility];  Service: Endoscopy;  Laterality: N/A;   LAPAROSCOPIC HYSTERECTOMY     MANDIBLE FRACTURE SURGERY     RADIOLOGY WITH ANESTHESIA N/A 08/21/2013   Procedure: RADIOLOGY WITH ANESTHESIA;  Surgeon: Rob Hickman, MD;  Location: [place];  Service: Radiology;  Laterality: N/A;   stones     no surgery    FAMILY HISTORY: Family History  Problem Relation Age of Onset   Heart disease Mother    Heart disease Father    Heart attack Father    Cancer Father        prostate to lymph node   Lung cancer Other    Breast cancer Neg Hx     SOCIAL HISTORY: Social History   Socioeconomic History   Marital status: Married    Spouse name: Jori Moll   Number of children: 2   Years of education: 14   Highest education level: Not on file  Occupational History   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    Drug use: No   Sexual activity: Not Currently  Other Topics Concern   Not on file  Social History Narrative   Patient lives at home with her husband Jori Moll).   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    Patient is on disability.    Patient has an Associates degree.   Patient is right-handed.   Patient drinks 2 cups of green tea most days.               Social Determinants of Health   Financial Resource Strain: Low Risk    Difficulty of Paying Living Expenses: Not hard at all  Food Insecurity: No Food Insecurity   Worried About Charity fundraiser in the Last Year: Never true   [place] in the Last Year: Never true  Transportation Needs: No Transportation Needs   Lack of Transportation (Medical): No   Lack of Transportation (Non-Medical): No  Physical Activity: Inactive   Days of Exercise per Week: 0 days   Minutes of Exercise per Session: 0 min  Stress: Stress Concern Present   Feeling of Stress : To some extent  Social Connections: Not on file  Intimate Partner Violence: Not on file     PHYSICAL EXAM  Vitals:   10/04/21 1038  BP: 122/70  Pulse: 90  Weight: 164 lb (74.4 kg)  Height: 5\' 2"  (1.575 m)     Body mass index is 30 kg/m.  Generalized: Well developed, pleasant middle-age Caucasian female, in no acute distress  Head: normocephalic and atraumatic,. Oropharynx benign  Neck: Supple, no carotid bruits  Cardiac: Regular rate rhythm, no murmur  Skin no rash few bruises Musculoskeletal: cervical brace in place  Neurological examination   Mentation: Alert oriented to time, place, history taking. Follows all commands speech and language fluent  Cranial nerve II-XII: Pupils were equal round reactive to light extraocular movements were full, visual field were full on confrontational test. Right lower facial weakness (most notable when smiling). hearing intact.  Uvula tongue midline.Tongue  protrusion into cheek strength was normal. Motor: Full strength left upper and lower extremity. RUE 4/5 with spasticity; RLE: 4/5 hip flexor, knee extension and flexion; 3/5 ankle dorsiflexion and plantarflexion with increased tone distally Sensory: Touch and pinprick are normal on the left, mildly decreased on the right, vibratory is decreased on the right lower extremity  Coordination: Impaired on the right normal on the left  Reflexes: Brisk reflexes on right otherwise 1+ Gait and Station: Rising up from seated position without assistance, wide based hemiplegic gait right foot drop using RW with mild imbalance and occasionally veering towards left.  Tandem walk and heel toe not attempted      DIAGNOSTIC DATA (LABS, IMAGING, TESTING) - I reviewed patient records, labs, notes, testing and imaging myself where available.  Lab Results  Component Value Date   WBC 7.1 09/02/2021   HGB 12.7 09/02/2021   HCT 39.7 09/02/2021   MCV 93.9 09/02/2021   PLT 195 09/02/2021      Component Value Date/Time   NA 139 09/02/2021 0943   NA 141 07/26/2021 0943   K 4.2 09/02/2021 0943   CL 104 09/02/2021 0943   CO2 27 09/02/2021 0943   GLUCOSE  97 09/02/2021 0943   BUN 14 09/02/2021 0943   BUN 12 07/26/2021 0943   CREATININE 0.55 09/02/2021 0943   CALCIUM 9.1 09/02/2021 0943   PROT 6.6 07/26/2021 0943   ALBUMIN 4.4 07/26/2021 0943   AST 27 07/26/2021 0943   ALT 23 07/26/2021 0943   ALKPHOS 107 07/26/2021 0943   BILITOT 0.7 07/26/2021 0943   GFRNONAA >60 09/02/2021 0943   GFRAA 107 09/16/2020 1429   Lipid Panel     Component Value Date/Time   CHOL 138 07/26/2021 0943   TRIG 108 07/26/2021 0943   HDL 67 07/26/2021 0943   CHOLHDL 2.1 07/26/2021 0943   CHOLHDL 2.8 11/23/2009 0435   VLDL 15 11/23/2009 0435   LDLCALC 52 07/26/2021 0943   LABVLDL 19 07/26/2021 0943         ASSESSMENT AND PLAN 66 y.o. year old female  has a past medical history of left frontoparietal parenchymal  hemorrhage March 2011 from PRES and malignant hypertension with residual right spastic hemiparesis and gait impairment. Chronic headaches with history of migraines.  Placement of pipeline stent across Left PICA aneurysm by Dr. Estanislado Pandy on 08/21/2013. History of seizure disorder on Keppra.     1.  Left frontoparietal hemorrhage 10/2009 -Residual deficits right spastic hemiparesis and gait impairment-limited benefit with Botox and tibial nerve block  -requests power operated scooter -inability to ambulate long distance due to residual stroke deficits and needs assistance/supervision to ambulate with use of cane/RW. Frequent falls with prolonged ambulation. Advised to reach out to other DME companies to see who has scooter and she will then notify us   -Continue compounding cream: Diclofenac, baclofen, cyclobenzaprine, gabapentin and bupivacaine -Continue Plavix and Crestor for secondary stroke prevention - advised no indication for aspirin from stroke standpoint - ongoing use per Dr. Estanislado Pandy s/p intracranial stent -Discussed secondary stroke prevention measures and importance of close PCP follow-up for aggressive stroke risk factor management including HTN with BP goal <130/90 and HLD with LDL below<70   2.  Seizure disorder -reports "eye fluttering" lasting for 2-3 seconds and then will lose trian of thought but a few seconds and quickly returned back to baseline - unsure etiology. Typically occurs with head movement to side. No additional events since surgical procedure currently in neck brace. If these should restart, advised to let us know. We can consider increasing keppra dose to see if this help eliminate these events -advised to call with any worsening of symptoms or reoccurring seizure activity -may need to consider EEG or Keppra dosage adjustment in the future -Continue Keppra 500 mg twice daily for seizure prophylaxis -refill provided    3.  Chronic headaches with migraine  history -stable without any recent headaches  4.  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  Left CTS, severe -s/p C4-6 ACDF and L CTS release 09/09/2021 by Dr. Reatha Armour  -continue [facility] PT/OT      Follow-up in 1 year or call earlier if needed    CC:  Glendale Chard, MD     I spent 36 minutes of face-to-face and non-face-to-face time with patient and husband.  This included previsit chart review, lab review, study review, order entry, electronic health record documentation, patient education regarding history of hemorrhage with residual deficits, secondary stroke prevention measures and aggressive stroke risk factor management, history of seizures and ongoing use of AED, chronic headaches and neck pain s/p procedure, and answered all other questions to patient and husband satisfaction   Frann Rider, AGNP-BC  [facility]  [address] [place], [place] [address]  Phone [phone] Fax [phone] Note: This document was prepared with digital dictation and possible smart phrase technology. Any transcriptional errors that result from this process are unintentional.

## 2021-10-05 DIAGNOSIS — M4712 Other spondylosis with myelopathy, cervical region: Secondary | ICD-10-CM | POA: Diagnosis not present

## 2021-10-05 DIAGNOSIS — G5602 Carpal tunnel syndrome, left upper limb: Secondary | ICD-10-CM | POA: Diagnosis not present

## 2021-10-05 DIAGNOSIS — M50121 Cervical disc disorder at C4-C5 level with radiculopathy: Secondary | ICD-10-CM | POA: Diagnosis not present

## 2021-10-05 DIAGNOSIS — Z4789 Encounter for other orthopedic aftercare: Secondary | ICD-10-CM | POA: Diagnosis not present

## 2021-10-05 DIAGNOSIS — I69351 Hemiplegia and hemiparesis following cerebral infarction affecting right dominant side: Secondary | ICD-10-CM | POA: Diagnosis not present

## 2021-10-05 DIAGNOSIS — I69311 Memory deficit following cerebral infarction: Secondary | ICD-10-CM | POA: Diagnosis not present

## 2021-10-12 DIAGNOSIS — E039 Hypothyroidism, unspecified: Secondary | ICD-10-CM | POA: Diagnosis not present

## 2021-10-12 DIAGNOSIS — M26609 Unspecified temporomandibular joint disorder, unspecified side: Secondary | ICD-10-CM | POA: Diagnosis not present

## 2021-10-12 DIAGNOSIS — G5602 Carpal tunnel syndrome, left upper limb: Secondary | ICD-10-CM | POA: Diagnosis not present

## 2021-10-12 DIAGNOSIS — I119 Hypertensive heart disease without heart failure: Secondary | ICD-10-CM | POA: Diagnosis not present

## 2021-10-12 DIAGNOSIS — I69398 Other sequelae of cerebral infarction: Secondary | ICD-10-CM | POA: Diagnosis not present

## 2021-10-12 DIAGNOSIS — Z87891 Personal history of nicotine dependence: Secondary | ICD-10-CM | POA: Diagnosis not present

## 2021-10-12 DIAGNOSIS — Z7951 Long term (current) use of inhaled steroids: Secondary | ICD-10-CM | POA: Diagnosis not present

## 2021-10-12 DIAGNOSIS — Z4789 Encounter for other orthopedic aftercare: Secondary | ICD-10-CM | POA: Diagnosis not present

## 2021-10-12 DIAGNOSIS — M4712 Other spondylosis with myelopathy, cervical region: Secondary | ICD-10-CM | POA: Diagnosis not present

## 2021-10-12 DIAGNOSIS — Z79891 Long term (current) use of opiate analgesic: Secondary | ICD-10-CM | POA: Diagnosis not present

## 2021-10-12 DIAGNOSIS — E7800 Pure hypercholesterolemia, unspecified: Secondary | ICD-10-CM | POA: Diagnosis not present

## 2021-10-12 DIAGNOSIS — I69351 Hemiplegia and hemiparesis following cerebral infarction affecting right dominant side: Secondary | ICD-10-CM | POA: Diagnosis not present

## 2021-10-12 DIAGNOSIS — Z7982 Long term (current) use of aspirin: Secondary | ICD-10-CM | POA: Diagnosis not present

## 2021-10-12 DIAGNOSIS — G4089 Other seizures: Secondary | ICD-10-CM | POA: Diagnosis not present

## 2021-10-12 DIAGNOSIS — Z9981 Dependence on supplemental oxygen: Secondary | ICD-10-CM | POA: Diagnosis not present

## 2021-10-12 DIAGNOSIS — M50121 Cervical disc disorder at C4-C5 level with radiculopathy: Secondary | ICD-10-CM | POA: Diagnosis not present

## 2021-10-12 DIAGNOSIS — J449 Chronic obstructive pulmonary disease, unspecified: Secondary | ICD-10-CM | POA: Diagnosis not present

## 2021-10-12 DIAGNOSIS — F339 Major depressive disorder, recurrent, unspecified: Secondary | ICD-10-CM | POA: Diagnosis not present

## 2021-10-12 DIAGNOSIS — R42 Dizziness and giddiness: Secondary | ICD-10-CM | POA: Diagnosis not present

## 2021-10-12 DIAGNOSIS — I69311 Memory deficit following cerebral infarction: Secondary | ICD-10-CM | POA: Diagnosis not present

## 2021-10-12 DIAGNOSIS — F419 Anxiety disorder, unspecified: Secondary | ICD-10-CM | POA: Diagnosis not present

## 2021-10-12 DIAGNOSIS — E559 Vitamin D deficiency, unspecified: Secondary | ICD-10-CM | POA: Diagnosis not present

## 2021-10-12 DIAGNOSIS — H3530 Unspecified macular degeneration: Secondary | ICD-10-CM | POA: Diagnosis not present

## 2021-10-12 DIAGNOSIS — Z9181 History of falling: Secondary | ICD-10-CM | POA: Diagnosis not present

## 2021-10-12 DIAGNOSIS — K219 Gastro-esophageal reflux disease without esophagitis: Secondary | ICD-10-CM | POA: Diagnosis not present

## 2021-10-14 DIAGNOSIS — Z20822 Contact with and (suspected) exposure to covid-19: Secondary | ICD-10-CM | POA: Diagnosis not present

## 2021-10-15 DIAGNOSIS — Z4789 Encounter for other orthopedic aftercare: Secondary | ICD-10-CM | POA: Diagnosis not present

## 2021-10-15 DIAGNOSIS — G5602 Carpal tunnel syndrome, left upper limb: Secondary | ICD-10-CM | POA: Diagnosis not present

## 2021-10-15 DIAGNOSIS — M50121 Cervical disc disorder at C4-C5 level with radiculopathy: Secondary | ICD-10-CM | POA: Diagnosis not present

## 2021-10-15 DIAGNOSIS — I69311 Memory deficit following cerebral infarction: Secondary | ICD-10-CM | POA: Diagnosis not present

## 2021-10-15 DIAGNOSIS — M4712 Other spondylosis with myelopathy, cervical region: Secondary | ICD-10-CM | POA: Diagnosis not present

## 2021-10-15 DIAGNOSIS — I69351 Hemiplegia and hemiparesis following cerebral infarction affecting right dominant side: Secondary | ICD-10-CM | POA: Diagnosis not present

## 2021-10-16 ENCOUNTER — Other Ambulatory Visit: Payer: Self-pay | Admitting: Internal Medicine

## 2021-10-19 DIAGNOSIS — H353122 Nonexudative age-related macular degeneration, left eye, intermediate dry stage: Secondary | ICD-10-CM | POA: Diagnosis not present

## 2021-10-19 DIAGNOSIS — H35033 Hypertensive retinopathy, bilateral: Secondary | ICD-10-CM | POA: Diagnosis not present

## 2021-10-19 DIAGNOSIS — H43823 Vitreomacular adhesion, bilateral: Secondary | ICD-10-CM | POA: Diagnosis not present

## 2021-10-19 DIAGNOSIS — H353211 Exudative age-related macular degeneration, right eye, with active choroidal neovascularization: Secondary | ICD-10-CM | POA: Diagnosis not present

## 2021-10-20 DIAGNOSIS — M50121 Cervical disc disorder at C4-C5 level with radiculopathy: Secondary | ICD-10-CM | POA: Diagnosis not present

## 2021-10-20 DIAGNOSIS — I69311 Memory deficit following cerebral infarction: Secondary | ICD-10-CM | POA: Diagnosis not present

## 2021-10-20 DIAGNOSIS — I69351 Hemiplegia and hemiparesis following cerebral infarction affecting right dominant side: Secondary | ICD-10-CM | POA: Diagnosis not present

## 2021-10-20 DIAGNOSIS — M4712 Other spondylosis with myelopathy, cervical region: Secondary | ICD-10-CM | POA: Diagnosis not present

## 2021-10-20 DIAGNOSIS — Z4789 Encounter for other orthopedic aftercare: Secondary | ICD-10-CM | POA: Diagnosis not present

## 2021-10-20 DIAGNOSIS — G5602 Carpal tunnel syndrome, left upper limb: Secondary | ICD-10-CM | POA: Diagnosis not present

## 2021-10-27 DIAGNOSIS — M47812 Spondylosis without myelopathy or radiculopathy, cervical region: Secondary | ICD-10-CM | POA: Diagnosis not present

## 2021-10-27 DIAGNOSIS — M5030 Other cervical disc degeneration, unspecified cervical region: Secondary | ICD-10-CM | POA: Diagnosis not present

## 2021-10-27 DIAGNOSIS — M542 Cervicalgia: Secondary | ICD-10-CM | POA: Diagnosis not present

## 2021-10-27 DIAGNOSIS — G5602 Carpal tunnel syndrome, left upper limb: Secondary | ICD-10-CM | POA: Diagnosis not present

## 2021-10-27 DIAGNOSIS — M5412 Radiculopathy, cervical region: Secondary | ICD-10-CM | POA: Diagnosis not present

## 2021-10-27 DIAGNOSIS — M4802 Spinal stenosis, cervical region: Secondary | ICD-10-CM | POA: Diagnosis not present

## 2021-11-08 ENCOUNTER — Encounter (HOSPITAL_BASED_OUTPATIENT_CLINIC_OR_DEPARTMENT_OTHER): Payer: Self-pay | Admitting: Cardiology

## 2021-11-08 ENCOUNTER — Ambulatory Visit (INDEPENDENT_AMBULATORY_CARE_PROVIDER_SITE_OTHER): Payer: Medicare Other | Admitting: Cardiology

## 2021-11-08 ENCOUNTER — Other Ambulatory Visit: Payer: Self-pay

## 2021-11-08 VITALS — BP 130/80 | HR 99 | Ht 62.0 in | Wt 162.7 lb

## 2021-11-08 DIAGNOSIS — Z7189 Other specified counseling: Secondary | ICD-10-CM | POA: Diagnosis not present

## 2021-11-08 DIAGNOSIS — Z8673 Personal history of transient ischemic attack (TIA), and cerebral infarction without residual deficits: Secondary | ICD-10-CM

## 2021-11-08 DIAGNOSIS — E785 Hyperlipidemia, unspecified: Secondary | ICD-10-CM | POA: Diagnosis not present

## 2021-11-08 DIAGNOSIS — I2510 Atherosclerotic heart disease of native coronary artery without angina pectoris: Secondary | ICD-10-CM

## 2021-11-08 DIAGNOSIS — I700 Atherosclerosis of aorta: Secondary | ICD-10-CM | POA: Diagnosis not present

## 2021-11-08 NOTE — Progress Notes (Signed)
Cardiology Office Note   Date:  11/08/2021   ID:  ANALIZ TVEDT, DOB [birth year], MRN [id number]  PCP:  Glendale Chard, MD  Cardiologist:  Buford Dresser, MD  Referring MD: Glendale Chard, MD   CC: follow up  History of Present Illness:    [name] is a 66 y.o. female with a hx of brain aneurysm, aortic atherosclerosis, hypertension, hyperlipidemia, former tobacco use, COPD, prior ICH who is seen for follow up. I initially saw her 04/20/20 as a new consult at the request of Glendale Chard, MD for the evaluation and management of dyspnea on exertion.  Cardiovascular risk factors: Prior clinical ASCVD: ICH/CVA Comorbid conditions: endorses hypertension, hyperlipidemia. Denies diabetes, chronic kidney disease Tobacco use history: former, from age 75 until the day of her stroke in 2011. None since. Was 2 ppd at her peak. Family history: father has stents, has had open heart surgery. Has cancer now. Mother has a heart murmur. No heart surgeries that she knows of. Has a brother, has sleep apnea. No known heart issues. Prior cardiac testing and/or incidental findings on other testing (ie coronary calcium): echo 2015. CT noncon lung 04/19/19 showed aortic atherosclerosis and coronary calcification. On noncontrast CT, has prominent calcium in distal left main/proximal LAD.  Today: Overall, she is feeling pretty good. She denies any recent issues with shooting pains in her LUE. She believes this was due to her carpal tunnel.   She endorses being short of breath "a lot" but also attributes this to her COPD. She remains compliant with albuterol which helps her.  Her blood pressure is well controlled today at 130/80. She did take her antihypertensives this morning.  At times she develops RLE edema such as after she stands up for too long.   She denies any palpitations, or chest pain. No lightheadedness, headaches, syncope, orthopnea, or PND.   Past Medical History:   Diagnosis Date   Abnormal gait    Amnesia    Anemia    hx   Aneurysm (HCC)    Anxiety    Asthma    Atrophic vaginitis    Benign hypertensive heart disease    Cardiomegaly    Chest pain    COPD (chronic obstructive pulmonary disease) ([place])     - PFTs  01/24/06 FEV1 69% ratio 68% diffusing capacity 57% with no improvement after B2   - PFT's 10/1/ 09 FEV1 77   ratio  76    dlco                   45            no resp to B2    - Nl alpha one antitripsin level 10/09   Cough    Depression    Difficulty speaking    Disorder of nasal cavity    Dyspnea    Enthesopathy of elbow region    GERD (gastroesophageal reflux disease)    Hammer toe    Headache(784.0)    Hemiparesis affecting dominant side as late effect of cerebrovascular accident [facility])    History of kidney stones    Hyperlipidemia    Hypertension    Hypothyroidism    Increased urinary frequency    Low compliance bladder    Macular degeneration of both eyes    Malaise and fatigue    Nonruptured cerebral aneurysm    Obesity    Pneumonia    hx   Pure hypercholesterolemia    Recurrent major depressive  episodes ([place])    Seizure disorder, secondary ([place])    2-3 focal siezures monthly   Seizures ([place])    Short-term memory loss    since stroke   Skin sensation disturbance    Stroke [facility]) 2011   rt sided weakness   Swelling of limb    TMJ (temporomandibular joint disorder)    Urinary tract infectious disease    Vertigo as late effect of stroke    Vitamin D deficiency     Past Surgical History:  Procedure Laterality Date   ANTERIOR CERVICAL DECOMP/DISCECTOMY FUSION N/A 09/09/2021   Procedure: Anterior  Cervical Decompression Fusion  Cervical four-five, Cervical five-six;  Surgeon: Karsten Ro, DO;  Location: [place];  Service: Neurosurgery;  Laterality: N/A;   BACK SURGERY     CARPAL TUNNEL RELEASE Left 09/09/2021   Procedure: LEFT CARPAL TUNNEL RELEASE;  Surgeon: Karsten Ro, DO;  Location: [place];  Service:  Neurosurgery;  Laterality: Left;   CEREBRAL ANGIOGRAM     x3   COLONOSCOPY WITH PROPOFOL N/A 08/18/2016   Procedure: COLONOSCOPY WITH PROPOFOL;  Surgeon: Carol Ada, MD;  Location: [facility];  Service: Endoscopy;  Laterality: N/A;   LAPAROSCOPIC HYSTERECTOMY     MANDIBLE FRACTURE SURGERY     RADIOLOGY WITH ANESTHESIA N/A 08/21/2013   Procedure: RADIOLOGY WITH ANESTHESIA;  Surgeon: Rob Hickman, MD;  Location: [place];  Service: Radiology;  Laterality: N/A;   stones     no surgery    Current Medications: Current Outpatient Medications on File Prior to Visit  Medication Sig   albuterol (PROVENTIL) (2.5 MG/3ML) 0.083% nebulizer solution Take 3 mLs (2.5 mg total) by nebulization every 6 (six) hours.   albuterol (VENTOLIN HFA) 108 (90 Base) MCG/ACT inhaler Inhale 2 puffs into the lungs every 6 (six) hours as needed for wheezing or shortness of breath.   ALPRAZolam (XANAX) 0.25 MG tablet Take 1 tablet (0.25 mg total) by mouth 2 (two) times daily as needed.   aspirin EC 81 MG tablet Take 81 mg by mouth daily.   Biotin 1 MG CAPS Take 1 tablet by mouth 2 (two) times daily. Viviscal   Calcium-Magnesium-Zinc (CAL-MAG-ZINC PO) Take 1 tablet by mouth daily.   Cholecalciferol (VITAMIN D-3) 1000 units CAPS Take 2,000 Units by mouth at bedtime.   citalopram (CELEXA) 40 MG tablet TAKE 1 TABLET AT BEDTIME   clopidogrel (PLAVIX) 75 MG tablet TAKE 1 TABLET DAILY   diclofenac Sodium (VOLTAREN) 1 % GEL Voltaren 1 % topical gel  APPLY 2 GRAM TO THE AFFECTED AREA(S) BY TOPICAL ROUTE 4 TIMES PER DAY   fluticasone (FLONASE) 50 MCG/ACT nasal spray Place 2 sprays into both nostrils daily as needed (allergies or post-nasl drip).    levETIRAcetam (KEPPRA) 500 MG tablet Take 1 tablet (500 mg total) by mouth every 12 (twelve) hours.   levothyroxine (SYNTHROID) 50 MCG tablet TAKE 1 TABLET ON Saturday and Sunday   levothyroxine (SYNTHROID) 75 MCG tablet TAKE 1 TABLET IN THE MORNING BEFORE BREAKFAST ON Monday  through Firday (TAKE 50 MCG ON OTHER DAYS)   methocarbamol (ROBAXIN) 500 MG tablet Take 1 tablet (500 mg total) by mouth 4 (four) times daily.   Multiple Vitamins-Minerals (PRESERVISION/LUTEIN PO) Take 1 tablet by mouth daily.   NONFORMULARY OR COMPOUNDED ITEM Apply 240 mg topically daily as needed.   Omega-3 Fatty Acids (FISH OIL) 1000 MG CAPS Take 2,000 mg by mouth 2 (two) times daily.    Polyethyl Glycol-Propyl Glycol (SYSTANE) 0.4-0.3 % SOLN  Place 1 drop into both eyes daily as needed (dry eyes).   Psyllium (FIBER) 28.3 % POWD Take 10 mLs by mouth 2 (two) times daily.   telmisartan (MICARDIS) 20 MG tablet TAKE 1 TABLET DAILY   vitamin C (ASCORBIC ACID) 500 MG tablet Take 1,000 mg by mouth at bedtime. Rose hips   rosuvastatin (CRESTOR) 20 MG tablet Take 1 tablet (20 mg total) by mouth daily. (Patient taking differently: Take 20 mg by mouth at bedtime.)   No current facility-administered medications on file prior to visit.     Allergies:   Phenobarbital and Phenytoin   Social History   Tobacco Use   Smoking status: Former    Packs/day: 2.00    Years: 30.00    Pack years: 60.00    Types: Cigarettes    Quit date: 11/27/2009    Years since quitting: 11.9   Smokeless tobacco: Never   Tobacco comments:    smoked 2.5 ppd x 5 years, 1ppd x 20+ years  Vaping Use   Vaping Use: Never used  Substance Use Topics   Alcohol use: No    Alcohol/week: 0.0 standard drinks    Comment: patient quit alcohol use 2005.    Drug use: No    Family History: family history includes Cancer in her father; Heart attack in her father; Heart disease in her father and mother; Lung cancer in an other family member. There is no history of Breast cancer.  ROS:   Please see the history of present illness.   (+) Shortness of breath (+) RLE edema Additional pertinent ROS otherwise unremarkable.  EKGs/Labs/Other Studies Reviewed:    The following studies were reviewed today:  Cardiac CTA  04/01/2021: FINDINGS: A 100 kV prospective scan was triggered in the descending thoracic aorta at 111 HU's. Axial non-contrast 3 mm slices were carried out through the heart. The data set was analyzed on a dedicated work station and scored using the [place]. Gantry rotation speed was 250 msecs and collimation was .6 mm. No beta blockade and 0.8 mg of sl NTG was given. The 3D data set was reconstructed in 5% intervals of the 67-82 % of the R-R cycle. Diastolic phases were analyzed on a dedicated work station using MPR, MIP and VRT modes. The patient received 100 cc of contrast.   Aorta:  Normal size.  Aortic atherosclerosis.  No dissection.   Main Pulmonary Artery: Normal size of the pulmonary artery.   Aortic Valve:  Tri-leaflet.  No calcifications.   Coronary Arteries:  Normal coronary origin. Right dominance.   Coronary Calcium Score:   Left main: 116   Left anterior descending artery: 390   Left circumflex artery: 19   Right coronary artery: 243   Total: 767   Percentile: 98th for age, sex, and race matched control.   RCA is a large dominant artery that gives rise to PDA and PLA. There a minimal non-obstructive calcified plaque in the proximal RCA. There a minimal non-obstructive calcified plaque in the mid RCA. There a minimal non-obstructive calcified plaque in the distal RCA   Left main is a large artery that gives rise to LAD and LCX arteries. There is a < 10% plaque in the proximal and distal left main.   LAD is a large vessel that gives rise to four diagonal vessels. There is a 16 mm length of mild non-obstructive calcified plaque in the proximal LAD. There is a mild non-obstructive calcified plaque at the ostium of the D2 vessel.  LCX is a non-dominant artery that gives rise to one large OM1 branch. There is mild non-obstructive calcified plaque in the proximal OM1.   Other findings:   Normal pulmonary vein drainage into the left atrium.    Normal left atrial appendage without a thrombus.   Cannot exclude small PFO.   Extra-cardiac findings: See attached radiology report for non-cardiac structures.   IMPRESSION: 1. Coronary calcium score of 767. This was 98th percentile for age, sex, and race matched control.   2. Normal coronary origin with right dominance.   3. CAD-RADS 2. Mild non-obstructive CAD (25-49%). Consider non-atherosclerotic causes of chest pain. Consider preventive therapy and risk factor modification.   4. Aortic atherosclerosis.  Lexiscan myoview 04/29/20 The left ventricular ejection fraction is hyperdynamic (>65%). Nuclear stress EF: 73%. The TID is increased at 1.34 but visually there does not appear to be transient ischemic dilatation. There was no ST segment deviation noted during stress. The perfusion study is normal. This is a low risk study.  CT angio chest/abdomen 02/14/14 IMPRESSION:  1. No aortic dissection.  No aneurysm.  2. No acute findings in the chest abdomen.  3. No abdominal mass.   Echo 01/30/14 Left ventricle: The cavity size was normal. Wall thickness was    normal. Systolic function was normal. The estimated ejection    fraction was in the range of 60% to 65%. Wall motion was normal;    there were no regional wall motion abnormalities. Doppler    parameters are consistent with abnormal left ventricular    relaxation (grade 1 diastolic dysfunction). The E/e&' ratio is <8,    suggesting normal LV filling pressure.  - Left atrium: The atrium was normal in size.  - Pericardium, extracardiac: There is a heterogenous mass inferior    to the RV which extends toward the apex and compresses the RV -    this is similar in appearance to liver and may represent    hepatomegaly or perhaps mass - consider dedicated    abdominal/thoracic imaging. There is the appearance of possible    mass invading the IVC in image 71. There was no pericardial    effusion.   Impressions:  - No  evidence for cardiomegaly. Heterogenous mass comressing the RV    and possibly invading the IVC seen in subcostal images. Recommend    dedicated thoracoabdominal imaging.   EKG:  EKG is personally reviewed.   11/08/2021: NSR at 99 bpm 05/05/2021: not ordered  10/29/2020: EKG was not ordered. 04/20/20: NSR at 87 bpm  Recent Labs: 07/26/2021: ALT 23; TSH 1.830 09/02/2021: BUN 14; Creatinine, Ser 0.55; Hemoglobin 12.7; Platelets 195; Potassium 4.2; Sodium 139   Recent Lipid Panel    Component Value Date/Time   CHOL 138 07/26/2021 0943   TRIG 108 07/26/2021 0943   HDL 67 07/26/2021 0943   CHOLHDL 2.1 07/26/2021 0943   CHOLHDL 2.8 11/23/2009 0435   VLDL 15 11/23/2009 0435   LDLCALC 52 07/26/2021 0943    Physical Exam:    VS:  BP 130/80 (BP Location: Left Arm, Patient Position: Sitting, Cuff Size: Large)    Pulse 99    Ht '5\' 2"'$  (1.575 m)    Wt 162 lb 11.2 oz (73.8 kg)    BMI 29.76 kg/m     Wt Readings from Last 3 Encounters:  11/08/21 162 lb 11.2 oz (73.8 kg)  10/04/21 164 lb (74.4 kg)  09/22/21 163 lb (73.9 kg)    GEN: Well nourished, well developed in  no acute distress HEENT: Normal, moist mucous membranes NECK: No JVD CARDIAC: regular rhythm, normal S1 and S2, no rubs or gallops. No murmur. VASCULAR: Radial and DP pulses 2+ bilaterally. No carotid bruits RESPIRATORY:  Clear to auscultation without rales, wheezing or rhonchi  ABDOMEN: Soft, non-tender, non-distended MUSCULOSKELETAL:  Ambulates independently SKIN: Warm and dry, no edema NEUROLOGIC:  Alert and oriented x 3. No focal neuro deficits noted. PSYCHIATRIC:  Normal affect     ASSESSMENT:    1. Nonocclusive coronary atherosclerosis of native coronary artery   2. Aortic atherosclerosis ([place])   3. History of CVA (cerebrovascular accident)   4. Hyperlipidemia LDL goal <70   5. Cardiac risk counseling   6. Counseling on health promotion and disease prevention     PLAN:    History of CVA, with history of  aneurysms Hypertension -continue telmisartan  Nonobstructive CAD Coronary Calcium Aortic atherosclerosis Hyperlipidemia -secondary prevention discussed -continue aspirin, clopidogrel per neurology -continue rosuvastatin 20 mg daily -LDL goal <70, last 52  Cardiac risk counseling and prevention recommendations: with family history of CV disease -recommend heart healthy/Mediterranean diet, with whole grains, fruits, vegetable, fish, lean meats, nuts, and olive oil. Limit salt. -recommend moderate walking, 3-5 times/week for 30-50 minutes each session. Aim for at least 150 minutes.week. Goal should be pace of 3 miles/hours, or walking 1.5 miles in 30 minutes -recommend avoidance of tobacco products. Avoid excess alcohol.  Plan for follow up: 1 year or sooner as needed  Buford Dresser, MD, PhD [place]   [facility]    Medication Adjustments/Labs and Tests Ordered: Current medicines are reviewed at length with the patient today.  Concerns regarding medicines are outlined above.   Orders Placed This Encounter  Procedures   EKG 12-Lead   No orders of the defined types were placed in this encounter.  Patient Instructions  Medication Instructions:  Your Physician recommend you continue on your current medication as directed.    *If you need a refill on your cardiac medications before your next appointment, please call your pharmacy*  Follow-Up: At [facility], you and your health needs are our priority.  As part of our continuing mission to provide you with exceptional heart care, we have created designated Provider Care Teams.  These Care Teams include your primary Cardiologist (physician) and Advanced Practice Providers (APPs -  Physician Assistants and Nurse Practitioners) who all work together to provide you with the care you need, when you need it.  We recommend signing up for the patient portal called "MyChart".  Sign up information is provided on this After  Visit Summary.  MyChart is used to connect with patients for Virtual Visits (Telemedicine).  Patients are able to view lab/test results, encounter notes, upcoming appointments, etc.  Non-urgent messages can be sent to your provider as well.   To learn more about what you can do with MyChart, go to [email or web address].    Your next appointment:   1 year(s)  The format for your next appointment:   In Person  Provider:   Buford Dresser, MD{    I,Mathew Stumpf,acting as a scribe for Buford Dresser, MD.,have documented all relevant documentation on the behalf of Buford Dresser, MD,as directed by  Buford Dresser, MD while in the presence of Buford Dresser, MD.  I, Buford Dresser, MD, have reviewed all documentation for this visit. The documentation on 11/08/21 for the exam, diagnosis, procedures, and orders are all accurate and complete.   Signed, Buford Dresser, MD PhD 11/08/2021  [facility]

## 2021-11-08 NOTE — Patient Instructions (Signed)
Medication Instructions:  ?Your Physician recommend you continue on your current medication as directed.   ? ?*If you need a refill on your cardiac medications before your next appointment, please call your pharmacy* ? ?Follow-Up: ?At [facility], you and your health needs are our priority.  As part of our continuing mission to provide you with exceptional heart care, we have created designated Provider Care Teams.  These Care Teams include your primary Cardiologist (physician) and Advanced Practice Providers (APPs -  Physician Assistants and Nurse Practitioners) who all work together to provide you with the care you need, when you need it. ? ?We recommend signing up for the patient portal called "MyChart".  Sign up information is provided on this After Visit Summary.  MyChart is used to connect with patients for Virtual Visits (Telemedicine).  Patients are able to view lab/test results, encounter notes, upcoming appointments, etc.  Non-urgent messages can be sent to your provider as well.   ?To learn more about what you can do with MyChart, go to [email or web address].   ? ?Your next appointment:   ?1 year(s) ? ?The format for your next appointment:   ?In Person ? ?Provider:   ?Bridgette Christopher, MD{ ? ?

## 2021-11-09 ENCOUNTER — Encounter (HOSPITAL_BASED_OUTPATIENT_CLINIC_OR_DEPARTMENT_OTHER): Payer: Self-pay | Admitting: Family

## 2021-11-15 ENCOUNTER — Other Ambulatory Visit: Payer: Self-pay

## 2021-11-15 ENCOUNTER — Ambulatory Visit (INDEPENDENT_AMBULATORY_CARE_PROVIDER_SITE_OTHER): Payer: Medicare Other | Admitting: Internal Medicine

## 2021-11-15 ENCOUNTER — Encounter: Payer: Self-pay | Admitting: Internal Medicine

## 2021-11-15 VITALS — BP 124/78 | HR 97 | Temp 97.7°F | Ht 62.0 in | Wt 160.4 lb

## 2021-11-15 DIAGNOSIS — Z6829 Body mass index (BMI) 29.0-29.9, adult: Secondary | ICD-10-CM

## 2021-11-15 DIAGNOSIS — I700 Atherosclerosis of aorta: Secondary | ICD-10-CM

## 2021-11-15 DIAGNOSIS — F331 Major depressive disorder, recurrent, moderate: Secondary | ICD-10-CM

## 2021-11-15 DIAGNOSIS — E663 Overweight: Secondary | ICD-10-CM

## 2021-11-15 DIAGNOSIS — I2510 Atherosclerotic heart disease of native coronary artery without angina pectoris: Secondary | ICD-10-CM | POA: Diagnosis not present

## 2021-11-15 DIAGNOSIS — J449 Chronic obstructive pulmonary disease, unspecified: Secondary | ICD-10-CM

## 2021-11-15 DIAGNOSIS — I119 Hypertensive heart disease without heart failure: Secondary | ICD-10-CM

## 2021-11-15 NOTE — Progress Notes (Signed)
[name],acting as a Education administrator for [name], MD.,have documented all relevant documentation on the behalf of [name], MD,as directed by  [name], MD while in the presence of [name], MD.  This visit occurred during the SARS-CoV-2 public health emergency.  Safety protocols were in place, including screening questions prior to the visit, additional usage of staff PPE, and extensive cleaning of exam room while observing appropriate contact time as indicated for disinfecting solutions.  Subjective:     Patient ID: [name] , female    DOB: 05-Feb-1956 , 65 y.o.   MRN: [id number]   Chief Complaint  Patient presents with   Hypertension    HPI  She presents today for BP check.  She reports compliance with meds. She denies having any dizziness, headaches and chest pain.   Hypertension This is a chronic problem. The current episode started more than 1 year ago. The problem has been gradually improving since onset. The problem is controlled. Pertinent negatives include no blurred vision, chest pain, headaches, palpitations or shortness of breath. Risk factors for coronary artery disease include post-menopausal state and sedentary lifestyle. The current treatment provides moderate improvement. Compliance problems include exercise.     Past Medical History:  Diagnosis Date   Abnormal gait    Amnesia    Anemia    hx   Aneurysm (HCC)    Anxiety    Asthma    Atrophic vaginitis    Benign hypertensive heart disease    Cardiomegaly    Chest pain    COPD (chronic obstructive pulmonary disease) (HCC)     - PFTs  01/24/06 FEV1 69% ratio 68% diffusing capacity 57% with no improvement after B2   - PFT's 10/1/ 09 FEV1 77   ratio  76    dlco                   45            no resp to B2    - Nl alpha one antitripsin level 10/09   Cough    Depression    Difficulty speaking    Disorder of nasal cavity    Dyspnea    Enthesopathy of elbow region    GERD  (gastroesophageal reflux disease)    Hammer toe    Headache(784.0)    Hemiparesis affecting dominant side as late effect of cerebrovascular accident ([place])    History of kidney stones    Hyperlipidemia    Hypertension    Hypothyroidism    Increased urinary frequency    Low compliance bladder    Macular degeneration of both eyes    Malaise and fatigue    Nonruptured cerebral aneurysm    Obesity    Pneumonia    hx   Pure hypercholesterolemia    Recurrent major depressive episodes (HCC)    Seizure disorder, secondary ([place])    2-3 focal siezures monthly   Seizures ([place])    Short-term memory loss    since stroke   Skin sensation disturbance    Stroke [facility]) 2011   rt sided weakness   Swelling of limb    TMJ (temporomandibular joint disorder)    Urinary tract infectious disease    Vertigo as late effect of stroke    Vitamin D deficiency      Family History  Problem Relation Age of Onset   Heart disease Mother    Heart disease Father  Heart attack Father    Cancer Father        prostate to lymph node   Lung cancer Other    Breast cancer Neg Hx      Current Outpatient Medications:    albuterol (PROVENTIL) (2.5 MG/3ML) 0.083% nebulizer solution, Take 3 mLs (2.5 mg total) by nebulization every 6 (six) hours., Disp: 1080 mL, Rfl: 1   albuterol (VENTOLIN HFA) 108 (90 Base) MCG/ACT inhaler, Inhale 2 puffs into the lungs every 6 (six) hours as needed for wheezing or shortness of breath., Disp: 24 g, Rfl: 3   ALPRAZolam (XANAX) 0.25 MG tablet, Take 1 tablet (0.25 mg total) by mouth 2 (two) times daily as needed., Disp: 60 tablet, Rfl: 2   aspirin EC 81 MG tablet, Take 81 mg by mouth daily., Disp: , Rfl:    Biotin 1 MG CAPS, Take 1 tablet by mouth 2 (two) times daily. Viviscal, Disp: , Rfl:    Calcium-Magnesium-Zinc (CAL-MAG-ZINC PO), Take 1 tablet by mouth daily., Disp: , Rfl:    Cholecalciferol (VITAMIN D-3) 1000 units CAPS, Take 2,000 Units by mouth at bedtime., Disp: , Rfl:     citalopram (CELEXA) 40 MG tablet, TAKE 1 TABLET AT BEDTIME, Disp: 90 tablet, Rfl: 3   clopidogrel (PLAVIX) 75 MG tablet, TAKE 1 TABLET DAILY, Disp: 90 tablet, Rfl: 3   diclofenac Sodium (VOLTAREN) 1 % GEL, Voltaren 1 % topical gel  APPLY 2 GRAM TO THE AFFECTED AREA(S) BY TOPICAL ROUTE 4 TIMES PER DAY, Disp: 350 g, Rfl: 2   fluticasone (FLONASE) 50 MCG/ACT nasal spray, Place 2 sprays into both nostrils daily as needed (allergies or post-nasl drip). , Disp: , Rfl: 3   levETIRAcetam (KEPPRA) 500 MG tablet, Take 1 tablet (500 mg total) by mouth every 12 (twelve) hours., Disp: 180 tablet, Rfl: 3   levothyroxine (SYNTHROID) 50 MCG tablet, TAKE 1 TABLET ON Saturday and Sunday, Disp: 39 tablet, Rfl: 3   levothyroxine (SYNTHROID) 75 MCG tablet, TAKE 1 TABLET IN THE MORNING BEFORE BREAKFAST ON Monday through Firday (TAKE 50 MCG ON OTHER DAYS), Disp: 51 tablet, Rfl: 3   methocarbamol (ROBAXIN) 500 MG tablet, Take 1 tablet (500 mg total) by mouth 4 (four) times daily., Disp: 90 tablet, Rfl: 1   Multiple Vitamins-Minerals (PRESERVISION/LUTEIN PO), Take 1 tablet by mouth daily., Disp: , Rfl:    NONFORMULARY OR COMPOUNDED ITEM, Apply 240 mg topically daily as needed., Disp: 1 each, Rfl: 3   Omega-3 Fatty Acids (FISH OIL) 1000 MG CAPS, Take 2,000 mg by mouth 2 (two) times daily. , Disp: , Rfl:    Polyethyl Glycol-Propyl Glycol (SYSTANE) 0.4-0.3 % SOLN, Place 1 drop into both eyes daily as needed (dry eyes)., Disp: , Rfl:    Psyllium (FIBER) 28.3 % POWD, Take 10 mLs by mouth 2 (two) times daily., Disp: , Rfl:    telmisartan (MICARDIS) 20 MG tablet, TAKE 1 TABLET DAILY, Disp: 90 tablet, Rfl: 3   vitamin C (ASCORBIC ACID) 500 MG tablet, Take 1,000 mg by mouth at bedtime. Rose hips, Disp: , Rfl:    rosuvastatin (CRESTOR) 20 MG tablet, Take 1 tablet (20 mg total) by mouth daily. (Patient taking differently: Take 20 mg by mouth at bedtime.), Disp: 90 tablet, Rfl: 3   Allergies  Allergen Reactions   Phenobarbital  Hives, Shortness Of Breath and Swelling   Phenytoin Anaphylaxis, Hives and Swelling     Review of Systems  Constitutional: Negative.   Eyes:  Negative for blurred vision.  Respiratory:  Negative.  Negative for shortness of breath.   Cardiovascular: Negative.  Negative for chest pain and palpitations.  Neurological: Negative.  Negative for headaches.  Psychiatric/Behavioral: Negative.      Today's Vitals   11/15/21 0859  BP: 124/78  Pulse: 97  Temp: 97.7 F (36.5 C)  Weight: 160 lb 6.4 oz (72.8 kg)  Height: '5\' 2"'  (1.575 m)  PainSc: 0-No pain   Body mass index is 29.34 kg/m.  Wt Readings from Last 3 Encounters:  11/15/21 160 lb 6.4 oz (72.8 kg)  11/08/21 162 lb 11.2 oz (73.8 kg)  10/04/21 164 lb (74.4 kg)   Objective:  Physical Exam Vitals and nursing note reviewed.  Constitutional:      Appearance: Normal appearance.  HENT:     Head: Normocephalic and atraumatic.     Nose:     Comments: Masked     Mouth/Throat:     Comments: Masked  Eyes:     Extraocular Movements: Extraocular movements intact.  Cardiovascular:     Rate and Rhythm: Normal rate and regular rhythm.     Heart sounds: Normal heart sounds.  Pulmonary:     Effort: Pulmonary effort is normal.     Breath sounds: Normal breath sounds.  Musculoskeletal:     Cervical back: Normal range of motion.     Comments: Ambulatory with cane  Skin:    General: Skin is warm.  Neurological:     General: No focal deficit present.     Mental Status: She is alert.  Psychiatric:        Mood and Affect: Mood normal.        Behavior: Behavior normal.     Assessment And Plan:     1. Hypertensive heart disease without heart failure Comments: Chronic, well controlled. No med changes. She is encouraged to follow a low sodium diet.  She will f/u in six months for re-evaluation. - BMP8+EGFR  2. Atherosclerosis of aorta (HCC) Comments: Chronic, encouraged to c/w ASA and statin therapy. Importance of dietary/medication  compliance was d/w patient.   3. Major depressive disorder, recurrent episode, moderate (HCC) Comments: Chronic, PHQ-9 was performed. She will c/w current meds.  4. Chronic obstructive pulmonary disease, unspecified COPD type ([place]) Comments: Chronic, recent Pulmonary note reviewed in full detail. Her sx are stable. She is encouraged to avoid triggers.   5. Overweight with body mass index (BMI) of 29 to 29.9 in adult  She is encouraged to strive for BMI less than 29 to decrease cardiac risk. Advised to aim for at least 150 minutes of exercise per week.    Patient was given opportunity to ask questions. Patient verbalized understanding of the plan and was able to repeat key elements of the plan. All questions were answered to their satisfaction.   I, [name], MD, have reviewed all documentation for this visit. The documentation on 11/15/21 for the exam, diagnosis, procedures, and orders are all accurate and complete.   IF YOU HAVE BEEN REFERRED TO A SPECIALIST, IT MAY TAKE 1-2 WEEKS TO SCHEDULE/PROCESS THE REFERRAL. IF YOU HAVE NOT HEARD FROM US/SPECIALIST IN TWO WEEKS, PLEASE GIVE [place] A CALL AT [phone].   THE PATIENT IS ENCOURAGED TO PRACTICE SOCIAL DISTANCING DUE TO THE COVID-19 PANDEMIC.

## 2021-11-15 NOTE — Patient Instructions (Signed)

## 2021-11-16 LAB — BMP8+EGFR
BUN/Creatinine Ratio: 31 — ABNORMAL HIGH (ref 12–28)
BUN: 19 mg/dL (ref 8–27)
CO2: 27 mmol/L (ref 20–29)
Calcium: 9.5 mg/dL (ref 8.7–10.3)
Chloride: 100 mmol/L (ref 96–106)
Creatinine, Ser: 0.61 mg/dL (ref 0.57–1.00)
Glucose: 93 mg/dL (ref 70–99)
Potassium: 4.5 mmol/L (ref 3.5–5.2)
Sodium: 141 mmol/L (ref 134–144)
eGFR: 99 mL/min/{1.73_m2} (ref >59)

## 2021-11-22 ENCOUNTER — Other Ambulatory Visit (HOSPITAL_COMMUNITY): Payer: Self-pay | Admitting: Interventional Radiology

## 2021-11-22 DIAGNOSIS — I671 Cerebral aneurysm, nonruptured: Secondary | ICD-10-CM

## 2021-11-30 DIAGNOSIS — Z20822 Contact with and (suspected) exposure to covid-19: Secondary | ICD-10-CM | POA: Diagnosis not present

## 2021-12-02 ENCOUNTER — Ambulatory Visit (HOSPITAL_COMMUNITY)
Admission: RE | Admit: 2021-12-02 | Discharge: 2021-12-02 | Disposition: A | Discharge status: Home / Self Care | Payer: Medicare Other | Source: Ambulatory Visit | Attending: Interventional Radiology | Admitting: Interventional Radiology

## 2021-12-02 DIAGNOSIS — I671 Cerebral aneurysm, nonruptured: Secondary | ICD-10-CM | POA: Insufficient documentation

## 2021-12-09 ENCOUNTER — Telehealth: Payer: Self-pay | Admitting: Pulmonary Disease

## 2021-12-09 ENCOUNTER — Other Ambulatory Visit: Payer: Self-pay | Admitting: Internal Medicine

## 2021-12-09 DIAGNOSIS — J449 Chronic obstructive pulmonary disease, unspecified: Secondary | ICD-10-CM

## 2021-12-10 MED ORDER — ALBUTEROL SULFATE HFA 108 (90 BASE) MCG/ACT IN AERS: 2.0000 | INHALATION_SPRAY | Freq: Four times a day (QID) | RESPIRATORY_TRACT | 3 refills | Status: DC | PRN

## 2021-12-10 MED ORDER — ALBUTEROL SULFATE (2.5 MG/3ML) 0.083% IN NEBU: 2.5000 mg | INHALATION_SOLUTION | Freq: Four times a day (QID) | RESPIRATORY_TRACT | 1 refills | Status: DC

## 2021-12-10 NOTE — Telephone Encounter (Signed)
Called patient and notified her that the medications were sent in for a 3 month supply. Nothing further needed  ?

## 2021-12-13 ENCOUNTER — Telehealth (HOSPITAL_COMMUNITY): Payer: Self-pay

## 2021-12-13 NOTE — Telephone Encounter (Signed)
Called pt to come in for a rescan, no answer, left vm. AW  ?

## 2021-12-27 ENCOUNTER — Telehealth (HOSPITAL_COMMUNITY): Payer: Self-pay

## 2021-12-27 NOTE — Telephone Encounter (Signed)
Pt agreed to f/u in 1 year with an mra. AW - please put in the comments and order for all aneurysms to be covered so that the patient doesn't have to come for a repeat scan ?

## 2021-12-27 NOTE — Telephone Encounter (Signed)
Called pt regarding recent imaging, no answer, left vm. AW  

## 2022-01-06 ENCOUNTER — Encounter (HOSPITAL_BASED_OUTPATIENT_CLINIC_OR_DEPARTMENT_OTHER): Payer: Self-pay

## 2022-01-06 ENCOUNTER — Encounter: Payer: Self-pay | Admitting: Internal Medicine

## 2022-01-07 NOTE — Telephone Encounter (Signed)
Please advise 

## 2022-01-11 DIAGNOSIS — H43823 Vitreomacular adhesion, bilateral: Secondary | ICD-10-CM | POA: Diagnosis not present

## 2022-01-11 DIAGNOSIS — H353122 Nonexudative age-related macular degeneration, left eye, intermediate dry stage: Secondary | ICD-10-CM | POA: Diagnosis not present

## 2022-01-11 DIAGNOSIS — H353211 Exudative age-related macular degeneration, right eye, with active choroidal neovascularization: Secondary | ICD-10-CM | POA: Diagnosis not present

## 2022-01-19 ENCOUNTER — Ambulatory Visit (INDEPENDENT_AMBULATORY_CARE_PROVIDER_SITE_OTHER): Payer: Medicare Other | Admitting: Nurse Practitioner

## 2022-01-19 ENCOUNTER — Encounter: Payer: Self-pay | Admitting: Nurse Practitioner

## 2022-01-19 VITALS — BP 128/68 | HR 76 | Temp 98.3°F

## 2022-01-19 DIAGNOSIS — G8111 Spastic hemiplegia affecting right dominant side: Secondary | ICD-10-CM | POA: Diagnosis not present

## 2022-01-19 DIAGNOSIS — M79674 Pain in right toe(s): Secondary | ICD-10-CM

## 2022-01-19 NOTE — Patient Instructions (Signed)
Toe Deformity Repair Toe deformity repair is a surgery to reposition a toe, or toes, that are bent. For example, a hammer toe is a deformity that causes the middle joint of a toe to stay bent. A toe deformity can cause pain and may interfere with walking. Surgery may be needed if other treatments have not helped to straighten the toe and relieved symptoms. The surgeon may do the procedure through small incisions (percutaneous technique) or through one larger incision, known as open surgery, to repair your toe deformity. Tell a health care provider about: Any allergies you have. All medicines you are taking, including vitamins, herbs, eye drops, creams, and over-the-counter medicines. Any problems you or family members have had with anesthetic medicines. Any blood disorders you have. Any surgeries you have had. Any medical conditions you have. Whether you are pregnant or may be pregnant. What are the risks? Generally, this is a safe procedure. However, problems may occur, including: Infection. Bleeding. Allergic reactions to medicines. Damage to nerves. Swelling. Pain. Numbness. Scarring. Toe positioning that is not straight (poor toe alignment). What happens before the procedure? Staying hydrated Follow instructions from your health care provider about hydration, which may include: Up to 2 hours before the procedure - you may continue to drink clear liquids, such as water, clear fruit juice, black coffee, and plain tea.  Eating and drinking restrictions Follow instructions from your health care provider about eating and drinking, which may include: 8 hours before the procedure - stop eating heavy meals or foods, such as meat, fried foods, or fatty foods. 6 hours before the procedure - stop eating light meals or foods, such as toast or cereal. 6 hours before the procedure - stop drinking milk or drinks that contain milk. 2 hours before the procedure - stop drinking clear  liquids. Medicines Ask your health care provider about: Changing or stopping your regular medicines. This is especially important if you are taking diabetes medicines or blood thinners. Taking medicines such as aspirin and ibuprofen. These medicines can thin your blood. Do not take these medicines unless your health care provider tells you to take them. Taking over-the-counter medicines, vitamins, herbs, and supplements. General instructions Ask your health care provider: How your surgery site will be marked. What steps will be taken to help prevent infection. These steps may include: Removing hair at the surgery site. Washing skin with a germ-killing soap. Taking antibiotic medicine. You may be asked to shower with a germ-killing soap. Do not use any products that contain nicotine or tobacco for at least 4 weeks before the procedure. These products include cigarettes, e-cigarettes, and chewing tobacco. If you need help quitting, ask your health care provider. Plan to have someone take you home from the hospital or clinic. Plan to have a responsible adult care for you for at least 24 hours after you leave the hospital or clinic. This is important. What happens during the procedure?  An IV will be inserted into one of your veins. You will be given one of the following: A medicine to help you relax (sedative). A medicine to numb the area (local anesthetic). The medicine will be injected directly into your toe. A medicine that is injected into an area of your body to numb everything below the injection site (regional anesthetic). A medicine to make you fall asleep (general anesthetic). The surgeon will make one or more incisions on your affected toe. Extra bone or extra soft tissue will be removed if it is causing the deformity.   Connective tissues such as tendons or ligaments will be removed or relocated if they are causing the deformity. Surgical pins, screws, or wires may be used to hold  your toe in place during the healing process. The incisions will be closed with stitches (sutures), skin glue, or surgical tape. A bandage (dressing) will be placed over the incision area. The procedure may vary among health care providers and hospitals. What happens after the procedure? Your blood pressure, heart rate, breathing rate, and blood oxygen level will be monitored until you leave the hospital or clinic. You will be given pain medicine as needed. You may have a post-operative shoe placed on your foot. If you were given a sedative during the procedure, it can affect you for several hours. Do not drive or operate machinery until your health care provider says that it is safe. Summary A toe that stays bent can be repaired with surgery (toe deformity repair). You may need surgery if other treatments have not helped to straighten the toe and relieve your symptoms. Follow instructions from your health care provider about eating and drinking before the procedure. Surgical pins, screws, or wires may be used to hold your toe in place during the healing process. This information is not intended to replace advice given to you by your health care provider. Make sure you discuss any questions you have with your health care provider. Document Revised: 11/28/2019 Document Reviewed: 11/28/2019 Elsevier Patient Education  2023 Elsevier Inc.  

## 2022-01-19 NOTE — Progress Notes (Signed)
?Industrial/product designer as a Education administrator for [employer], FNP.,have documented all relevant documentation on the behalf of Minette Brine, FNP,as directed by  Minette Brine, FNP while in the presence of Minette Brine, [place]. ? ?This visit occurred during the SARS-CoV-2 public health emergency.  Safety protocols were in place, including screening questions prior to the visit, additional usage of staff PPE, and extensive cleaning of exam room while observing appropriate contact time as indicated for disinfecting solutions. ? ?Subjective:  ?  ? Patient ID: [name] , female    DOB: 28-Jul-1956 , 66 y.o.   MRN: [id number] ? ? ?Chief Complaint  ?Patient presents with  ? Toe Pain  ? ? ?HPI ? ?Patient presents today for toe pain to 5th toe for the last couple months, reports is turning outward. Pain has been worse in the last couple weeks. It is limiting her from walking long distances and sitting more at home. She has a corn cushion or wrap in a bandaid. She has also taped her 4th and 5th toe together.  ? ? ?Toe Pain  ?The incident occurred more than 1 week ago. There was no injury mechanism. The quality of the pain is described as burning. Pertinent negatives include no inability to bear weight, loss of motion, numbness or tingling. The symptoms are aggravated by movement. She has tried nothing for the symptoms.   ? ?Past Medical History:  ?Diagnosis Date  ? Abnormal gait   ? Amnesia   ? Anemia   ? hx  ? Aneurysm ([place])   ? Anxiety   ? Asthma   ? Atrophic vaginitis   ? Benign hypertensive heart disease   ? Cardiomegaly   ? Chest pain   ? COPD (chronic obstructive pulmonary disease) ([place])   ?  - PFTs  01/24/06 FEV1 69% ratio 68% diffusing capacity 57% with no improvement after B2   - PFT's 10/1/ 09 FEV1 77   ratio  76    dlco                   45            no resp to B2    - Nl alpha one antitripsin level 10/09  ? Cough   ? Depression   ? Difficulty speaking   ? Disorder of nasal cavity   ? Dyspnea   ? Enthesopathy of  elbow region   ? GERD (gastroesophageal reflux disease)   ? Hammer toe   ? Headache(784.0)   ? Hemiparesis affecting dominant side as late effect of cerebrovascular accident [facility])   ? History of kidney stones   ? Hyperlipidemia   ? Hypertension   ? Hypothyroidism   ? Increased urinary frequency   ? Low compliance bladder   ? Macular degeneration of both eyes   ? Malaise and fatigue   ? Nonruptured cerebral aneurysm   ? Obesity   ? Pneumonia   ? hx  ? Pure hypercholesterolemia   ? Recurrent major depressive episodes ([place])   ? Seizure disorder, secondary ([place])   ? 2-3 focal siezures monthly  ? Seizures ([place])   ? Short-term memory loss   ? since stroke  ? Skin sensation disturbance   ? Stroke [facility]) 2011  ? rt sided weakness  ? Swelling of limb   ? TMJ (temporomandibular joint disorder)   ? Urinary tract infectious disease   ? Vertigo as late effect of stroke   ? Vitamin D deficiency   ?  ? ?  Family History  ?Problem Relation Age of Onset  ? Heart disease Mother   ? Heart disease Father   ? Heart attack Father   ? Cancer Father   ?     prostate to lymph node  ? Lung cancer Other   ? Breast cancer Neg Hx   ? ? ? ?Current Outpatient Medications:  ?  albuterol (PROVENTIL) (2.5 MG/3ML) 0.083% nebulizer solution, Take 3 mLs (2.5 mg total) by nebulization every 6 (six) hours., Disp: 1080 mL, Rfl: 1 ?  albuterol (VENTOLIN HFA) 108 (90 Base) MCG/ACT inhaler, Inhale 2 puffs into the lungs every 6 (six) hours as needed for wheezing or shortness of breath., Disp: 24 g, Rfl: 3 ?  ALPRAZolam (XANAX) 0.25 MG tablet, TAKE 1 TABLET TWICE A DAY AS NEEDED, Disp: 60 tablet, Rfl: 2 ?  aspirin EC 81 MG tablet, Take 81 mg by mouth daily., Disp: , Rfl:  ?  Biotin 1 MG CAPS, Take 1 tablet by mouth 2 (two) times daily. Viviscal, Disp: , Rfl:  ?  Calcium-Magnesium-Zinc (CAL-MAG-ZINC PO), Take 1 tablet by mouth daily., Disp: , Rfl:  ?  Cholecalciferol (VITAMIN D-3) 1000 units CAPS, Take 2,000 Units by mouth at bedtime., Disp: , Rfl:  ?  citalopram  (CELEXA) 40 MG tablet, TAKE 1 TABLET AT BEDTIME, Disp: 90 tablet, Rfl: 3 ?  clopidogrel (PLAVIX) 75 MG tablet, TAKE 1 TABLET DAILY, Disp: 90 tablet, Rfl: 3 ?  diclofenac Sodium (VOLTAREN) 1 % GEL, Voltaren 1 % topical gel  APPLY 2 GRAM TO THE AFFECTED AREA(S) BY TOPICAL ROUTE 4 TIMES PER DAY, Disp: 350 g, Rfl: 2 ?  fluticasone (FLONASE) 50 MCG/ACT nasal spray, Place 2 sprays into both nostrils daily as needed (allergies or post-nasl drip). , Disp: , Rfl: 3 ?  levETIRAcetam (KEPPRA) 500 MG tablet, Take 1 tablet (500 mg total) by mouth every 12 (twelve) hours., Disp: 180 tablet, Rfl: 3 ?  levothyroxine (SYNTHROID) 50 MCG tablet, TAKE 1 TABLET ON Saturday and Sunday, Disp: 39 tablet, Rfl: 3 ?  levothyroxine (SYNTHROID) 75 MCG tablet, TAKE 1 TABLET IN THE MORNING BEFORE BREAKFAST ON Monday through Firday (TAKE 50 MCG ON OTHER DAYS), Disp: 51 tablet, Rfl: 3 ?  methocarbamol (ROBAXIN) 500 MG tablet, Take 1 tablet (500 mg total) by mouth 4 (four) times daily., Disp: 90 tablet, Rfl: 1 ?  Multiple Vitamins-Minerals (PRESERVISION/LUTEIN PO), Take 1 tablet by mouth daily., Disp: , Rfl:  ?  NONFORMULARY OR COMPOUNDED ITEM, Apply 240 mg topically daily as needed., Disp: 1 each, Rfl: 3 ?  Omega-3 Fatty Acids (FISH OIL) 1000 MG CAPS, Take 2,000 mg by mouth 2 (two) times daily. , Disp: , Rfl:  ?  Polyethyl Glycol-Propyl Glycol (SYSTANE) 0.4-0.3 % SOLN, Place 1 drop into both eyes daily as needed (dry eyes)., Disp: , Rfl:  ?  Psyllium (FIBER) 28.3 % POWD, Take 10 mLs by mouth 2 (two) times daily., Disp: , Rfl:  ?  rosuvastatin (CRESTOR) 20 MG tablet, Take 1 tablet (20 mg total) by mouth daily. (Patient taking differently: Take 20 mg by mouth at bedtime.), Disp: 90 tablet, Rfl: 3 ?  telmisartan (MICARDIS) 20 MG tablet, TAKE 1 TABLET DAILY, Disp: 90 tablet, Rfl: 3 ?  vitamin C (ASCORBIC ACID) 500 MG tablet, Take 1,000 mg by mouth at bedtime. Rose hips, Disp: , Rfl:   ? ?Allergies  ?Allergen Reactions  ? Phenobarbital Hives, Shortness  Of Breath and Swelling  ? Phenytoin Anaphylaxis, Hives and Swelling  ?  ? ?  Review of Systems  ?Constitutional: Negative.   ?Respiratory: Negative.    ?Cardiovascular: Negative.   ?Gastrointestinal: Negative.   ?Musculoskeletal:  Positive for gait problem.  ?     Toe pain ?  ?Neurological:  Negative for tingling and numbness.   ? ?Today's Vitals  ? 01/19/22 1046  ?BP: 128/68  ?Pulse: 76  ?Temp: 98.3 ?F (36.8 ?C)  ?TempSrc: Oral  ? ?There is no height or weight on file to calculate BMI.  ? ?Objective:  ?Physical Exam ?Constitutional:   ?   General: She is not in acute distress. ?   Appearance: Normal appearance. She is normal weight.  ?Pulmonary:  ?   Effort: Pulmonary effort is normal. No respiratory distress.  ?Musculoskeletal:     ?   General: Swelling (right 5th metatarsal with erythema and has a small calloused area to lateral toe.) present. No tenderness.  ?   Comments: Right 5th metatarsal rotating laterally  ?Neurological:  ?   General: No focal deficit present.  ?   Mental Status: She is oriented to person, place, and time.  ?   Cranial Nerves: No cranial nerve deficit.  ?   Motor: No weakness.  ?Psychiatric:     ?   Mood and Affect: Mood normal.     ?   Behavior: Behavior normal.     ?   Thought Content: Thought content normal.     ?   Judgment: Judgment normal.  ?  ? ?   ?Assessment And Plan:  ?   ?1. Pain of toe of right foot ?Comments: 5th metatarsal with erythema and rotating lateral with a calloused area present. Will refer to Podiatry for further evaluation ?- Ambulatory referral to Podiatry ? ?2. Right spastic hemiparesis ([place]) ?  ? ? ?Patient was given opportunity to ask questions. Patient verbalized understanding of the plan and was able to repeat key elements of the plan. All questions were answered to their satisfaction.  ?Minette Brine, FNP  ? ?I, Minette Brine, FNP, have reviewed all documentation for this visit. The documentation on 01/19/22 for the exam, diagnosis, procedures, and orders are all  accurate and complete.  ? ?IF YOU HAVE BEEN REFERRED TO A SPECIALIST, IT MAY TAKE 1-2 WEEKS TO SCHEDULE/PROCESS THE REFERRAL. IF YOU HAVE NOT HEARD FROM US/SPECIALIST IN TWO WEEKS, PLEASE GIVE [place] A CALL

## 2022-01-22 ENCOUNTER — Other Ambulatory Visit: Payer: Self-pay | Admitting: Internal Medicine

## 2022-01-22 DIAGNOSIS — E039 Hypothyroidism, unspecified: Secondary | ICD-10-CM

## 2022-01-26 DIAGNOSIS — M4802 Spinal stenosis, cervical region: Secondary | ICD-10-CM | POA: Diagnosis not present

## 2022-01-26 DIAGNOSIS — M47812 Spondylosis without myelopathy or radiculopathy, cervical region: Secondary | ICD-10-CM | POA: Diagnosis not present

## 2022-01-26 DIAGNOSIS — Z6828 Body mass index (BMI) 28.0-28.9, adult: Secondary | ICD-10-CM | POA: Diagnosis not present

## 2022-02-02 ENCOUNTER — Ambulatory Visit (INDEPENDENT_AMBULATORY_CARE_PROVIDER_SITE_OTHER): Payer: Medicare Other | Admitting: Podiatry

## 2022-02-02 DIAGNOSIS — L539 Erythematous condition, unspecified: Secondary | ICD-10-CM

## 2022-02-02 DIAGNOSIS — Q828 Other specified congenital malformations of skin: Secondary | ICD-10-CM

## 2022-02-02 MED ORDER — DOXYCYCLINE HYCLATE 100 MG PO TABS: 100.0000 mg | ORAL_TABLET | Freq: Two times a day (BID) | ORAL | 0 refills | Status: DC

## 2022-02-03 ENCOUNTER — Encounter: Payer: Self-pay | Admitting: Podiatry

## 2022-02-03 NOTE — Progress Notes (Signed)
Subjective:  Patient ID: [name], female    DOB: [birth year],  MRN: [id number]  Chief Complaint  Patient presents with   Toe Pain    66 y.o. female presents with the above complaint.  Patient presents with complaint of right fifth digit redness swelling and painful toe.  Patient states painful to touch is progressive gotten worse.  There is a hyperkeratotic lesion/porokeratosis.  As hurts with ambulation there is a hammertoe contracture present.There has been some redness.  She has a family history of gout with her mother.  She denies having gout herself.  Her diet is pretty clean.  She denies any other acute complaints   Review of Systems: Negative except as noted in the HPI. Denies N/V/F/Ch.  Past Medical History:  Diagnosis Date   Abnormal gait    Amnesia    Anemia    hx   Aneurysm (HCC)    Anxiety    Asthma    Atrophic vaginitis    Benign hypertensive heart disease    Cardiomegaly    Chest pain    COPD (chronic obstructive pulmonary disease) ([place])     - PFTs  01/24/06 FEV1 69% ratio 68% diffusing capacity 57% with no improvement after B2   - PFT's 10/1/ 09 FEV1 77   ratio  76    dlco                   45            no resp to B2    - Nl alpha one antitripsin level 10/09   Cough    Depression    Difficulty speaking    Disorder of nasal cavity    Dyspnea    Enthesopathy of elbow region    GERD (gastroesophageal reflux disease)    Hammer toe    Headache(784.0)    Hemiparesis affecting dominant side as late effect of cerebrovascular accident ([place])    History of kidney stones    Hyperlipidemia    Hypertension    Hypothyroidism    Increased urinary frequency    Low compliance bladder    Macular degeneration of both eyes    Malaise and fatigue    Nonruptured cerebral aneurysm    Obesity    Pneumonia    hx   Pure hypercholesterolemia    Recurrent major depressive episodes ([place])    Seizure disorder, secondary ([place])    2-3 focal siezures monthly   Seizures  ([place])    Short-term memory loss    since stroke   Skin sensation disturbance    Stroke [facility]) 2011   rt sided weakness   Swelling of limb    TMJ (temporomandibular joint disorder)    Urinary tract infectious disease    Vertigo as late effect of stroke    Vitamin D deficiency     Current Outpatient Medications:    doxycycline (VIBRA-TABS) 100 MG tablet, Take 1 tablet (100 mg total) by mouth 2 (two) times daily., Disp: 20 tablet, Rfl: 0   albuterol (PROVENTIL) (2.5 MG/3ML) 0.083% nebulizer solution, Take 3 mLs (2.5 mg total) by nebulization every 6 (six) hours., Disp: 1080 mL, Rfl: 1   albuterol (VENTOLIN HFA) 108 (90 Base) MCG/ACT inhaler, Inhale 2 puffs into the lungs every 6 (six) hours as needed for wheezing or shortness of breath., Disp: 24 g, Rfl: 3   ALPRAZolam (XANAX) 0.25 MG tablet, TAKE 1 TABLET TWICE A DAY AS NEEDED, Disp: 60 tablet, Rfl: 2  aspirin EC 81 MG tablet, Take 81 mg by mouth daily., Disp: , Rfl:    Biotin 1 MG CAPS, Take 1 tablet by mouth 2 (two) times daily. Viviscal, Disp: , Rfl:    Calcium-Magnesium-Zinc (CAL-MAG-ZINC PO), Take 1 tablet by mouth daily., Disp: , Rfl:    Cholecalciferol (VITAMIN D-3) 1000 units CAPS, Take 2,000 Units by mouth at bedtime., Disp: , Rfl:    citalopram (CELEXA) 40 MG tablet, TAKE 1 TABLET AT BEDTIME, Disp: 90 tablet, Rfl: 3   clopidogrel (PLAVIX) 75 MG tablet, TAKE 1 TABLET DAILY, Disp: 90 tablet, Rfl: 3   diclofenac Sodium (VOLTAREN) 1 % GEL, Voltaren 1 % topical gel  APPLY 2 GRAM TO THE AFFECTED AREA(S) BY TOPICAL ROUTE 4 TIMES PER DAY, Disp: 350 g, Rfl: 2   fluticasone (FLONASE) 50 MCG/ACT nasal spray, Place 2 sprays into both nostrils daily as needed (allergies or post-nasl drip). , Disp: , Rfl: 3   levETIRAcetam (KEPPRA) 500 MG tablet, Take 1 tablet (500 mg total) by mouth every 12 (twelve) hours., Disp: 180 tablet, Rfl: 3   levothyroxine (SYNTHROID) 50 MCG tablet, TAKE 1 TABLET ON Saturday and Sunday, Disp: 39 tablet, Rfl: 3    levothyroxine (SYNTHROID) 75 MCG tablet, TAKE 1 TABLET IN THE MORNING BEFORE BREAKFAST MONDAY THROUGH FRIDAY (TAKE 50 MCG ON OTHER DAYS), Disp: 51 tablet, Rfl: 4   methocarbamol (ROBAXIN) 500 MG tablet, Take 1 tablet (500 mg total) by mouth 4 (four) times daily., Disp: 90 tablet, Rfl: 1   Multiple Vitamins-Minerals (PRESERVISION/LUTEIN PO), Take 1 tablet by mouth daily., Disp: , Rfl:    NONFORMULARY OR COMPOUNDED ITEM, Apply 240 mg topically daily as needed., Disp: 1 each, Rfl: 3   Omega-3 Fatty Acids (FISH OIL) 1000 MG CAPS, Take 2,000 mg by mouth 2 (two) times daily. , Disp: , Rfl:    Polyethyl Glycol-Propyl Glycol (SYSTANE) 0.4-0.3 % SOLN, Place 1 drop into both eyes daily as needed (dry eyes)., Disp: , Rfl:    Psyllium (FIBER) 28.3 % POWD, Take 10 mLs by mouth 2 (two) times daily., Disp: , Rfl:    rosuvastatin (CRESTOR) 20 MG tablet, Take 1 tablet (20 mg total) by mouth daily. (Patient taking differently: Take 20 mg by mouth at bedtime.), Disp: 90 tablet, Rfl: 3   telmisartan (MICARDIS) 20 MG tablet, TAKE 1 TABLET DAILY, Disp: 90 tablet, Rfl: 3   vitamin C (ASCORBIC ACID) 500 MG tablet, Take 1,000 mg by mouth at bedtime. Rose hips, Disp: , Rfl:   Social History   Tobacco Use  Smoking Status Former   Packs/day: 2.00   Years: 30.00   Pack years: 60.00   Types: Cigarettes   Quit date: 11/27/2009   Years since quitting: 12.1  Smokeless Tobacco Never  Tobacco Comments   smoked 2.5 ppd x 5 years, 1ppd x 20+ years    Allergies  Allergen Reactions   Phenobarbital Hives, Shortness Of Breath and Swelling   Phenytoin Anaphylaxis, Hives and Swelling   Objective:  There were no vitals filed for this visit. There is no height or weight on file to calculate BMI. Constitutional Well developed. Well nourished.  Vascular Dorsalis pedis pulses palpable bilaterally. Posterior tibial pulses palpable bilaterally. Capillary refill normal to all digits.  No cyanosis or clubbing noted. Pedal hair  growth normal.  Neurologic Normal speech. Oriented to person, place, and time. Epicritic sensation to light touch grossly present bilaterally.  Dermatologic Nails well groomed and normal in appearance. No open wounds. No skin lesions.  Orthopedic: Right fifth digit red hot swollen toe.  Porokeratotic lesion noted to the fifth digit.  No open ulceration noted.   Radiographs: None Assessment:   1. Erythema   2. Porokeratosis    Plan:  Patient was evaluated and treated and all questions answered.  Right fifth digit erythema/porokeratosis -I explained the patient the etiology of erythema versus treatment options were discussed.  This could likely be infectious in nature given that there is a hyperkeratotic lesion with superficial breakdown.  I believe she will benefit from antibiotics doxycycline was prescribed for 10 days.  Ultimately she does have family history of gout and she could be having a gout flareup. -If there is no improvement we will discuss gouty work-up. -Surgical shoe was dispensed  No follow-ups on file.

## 2022-02-05 ENCOUNTER — Other Ambulatory Visit: Payer: Self-pay | Admitting: Cardiology

## 2022-02-05 DIAGNOSIS — Z8673 Personal history of transient ischemic attack (TIA), and cerebral infarction without residual deficits: Secondary | ICD-10-CM

## 2022-02-05 DIAGNOSIS — I2510 Atherosclerotic heart disease of native coronary artery without angina pectoris: Secondary | ICD-10-CM

## 2022-02-05 DIAGNOSIS — I700 Atherosclerosis of aorta: Secondary | ICD-10-CM

## 2022-02-05 DIAGNOSIS — E785 Hyperlipidemia, unspecified: Secondary | ICD-10-CM

## 2022-02-20 ENCOUNTER — Other Ambulatory Visit: Payer: Self-pay | Admitting: Adult Health

## 2022-02-20 DIAGNOSIS — G8111 Spastic hemiplegia affecting right dominant side: Secondary | ICD-10-CM

## 2022-02-20 DIAGNOSIS — I69398 Other sequelae of cerebral infarction: Secondary | ICD-10-CM

## 2022-02-21 DIAGNOSIS — L821 Other seborrheic keratosis: Secondary | ICD-10-CM | POA: Diagnosis not present

## 2022-02-21 DIAGNOSIS — L578 Other skin changes due to chronic exposure to nonionizing radiation: Secondary | ICD-10-CM | POA: Diagnosis not present

## 2022-02-21 DIAGNOSIS — L570 Actinic keratosis: Secondary | ICD-10-CM | POA: Diagnosis not present

## 2022-02-21 DIAGNOSIS — L814 Other melanin hyperpigmentation: Secondary | ICD-10-CM | POA: Diagnosis not present

## 2022-02-21 DIAGNOSIS — D225 Melanocytic nevi of trunk: Secondary | ICD-10-CM | POA: Diagnosis not present

## 2022-02-22 DIAGNOSIS — H524 Presbyopia: Secondary | ICD-10-CM | POA: Diagnosis not present

## 2022-02-22 DIAGNOSIS — H2513 Age-related nuclear cataract, bilateral: Secondary | ICD-10-CM | POA: Diagnosis not present

## 2022-02-22 DIAGNOSIS — H353211 Exudative age-related macular degeneration, right eye, with active choroidal neovascularization: Secondary | ICD-10-CM | POA: Diagnosis not present

## 2022-02-22 DIAGNOSIS — H52223 Regular astigmatism, bilateral: Secondary | ICD-10-CM | POA: Diagnosis not present

## 2022-02-22 DIAGNOSIS — Z79899 Other long term (current) drug therapy: Secondary | ICD-10-CM | POA: Diagnosis not present

## 2022-02-22 DIAGNOSIS — H353122 Nonexudative age-related macular degeneration, left eye, intermediate dry stage: Secondary | ICD-10-CM | POA: Diagnosis not present

## 2022-02-22 DIAGNOSIS — H02831 Dermatochalasis of right upper eyelid: Secondary | ICD-10-CM | POA: Diagnosis not present

## 2022-02-23 NOTE — Telephone Encounter (Signed)
Looks like neurosurg d/c'd back in January after cervical procedure. Would recommend she f/u with them - unsure if specific reason this was stopped. Looks like Megan reached out to see if she was still using this medication. My recent note stated to continue the compounding cream but no mention of oral Flexeril.

## 2022-02-24 NOTE — Telephone Encounter (Signed)
From note review, looks like PCP has been prescribing on a long term basis. We refilled last year per request for that one time but ongoing refills should continue to be obtained by PCP. Thank you.

## 2022-02-24 NOTE — Telephone Encounter (Signed)
I called the pt and we discussed this message. She reports she has been taking this med since her last visit with our office as needed. She reports she discussed with her neurosurgeon in office post cervical procedure and agreed to continue the flexeril 10. She would like to continue receiving this med. She is not in need of a refill at this time though. She opened a new bottle just a few days ago.   Will confirm with NP if we can continue to give this med as refill. Pt next f/u is scheduled for Jan of 2024.

## 2022-03-09 ENCOUNTER — Ambulatory Visit (INDEPENDENT_AMBULATORY_CARE_PROVIDER_SITE_OTHER): Payer: Medicare Other | Admitting: Podiatry

## 2022-03-09 DIAGNOSIS — Q828 Other specified congenital malformations of skin: Secondary | ICD-10-CM

## 2022-03-09 DIAGNOSIS — M2041 Other hammer toe(s) (acquired), right foot: Secondary | ICD-10-CM

## 2022-03-09 DIAGNOSIS — I999 Unspecified disorder of circulatory system: Secondary | ICD-10-CM | POA: Diagnosis not present

## 2022-03-15 NOTE — Progress Notes (Signed)
Subjective:  Patient ID: [name], female    DOB: 1955/09/12,  MRN: [id number]  Chief Complaint  Patient presents with   Toe Pain     Pain of toe of right foot    66 y.o. female presents with the above complaint.  Patient presents with complaint of right fifth digit porokeratotic lesion/heloma molle with underlying hammertoe.  Patient states pain for touch is progressive gotten worse.  She would like to discuss surgical options to correct it.  She states she is doing better than last time.  Pain scale is 5 out of 10 hurts with ambulation and hurts with pressure.   Review of Systems: Negative except as noted in the HPI. Denies N/V/F/Ch.  Past Medical History:  Diagnosis Date   Abnormal gait    Amnesia    Anemia    hx   Aneurysm (HCC)    Anxiety    Asthma    Atrophic vaginitis    Benign hypertensive heart disease    Cardiomegaly    Chest pain    COPD (chronic obstructive pulmonary disease) ([place])     - PFTs  01/24/06 FEV1 69% ratio 68% diffusing capacity 57% with no improvement after B2   - PFT's 10/1/ 09 FEV1 77   ratio  76    dlco                   45            no resp to B2    - Nl alpha one antitripsin level 10/09   Cough    Depression    Difficulty speaking    Disorder of nasal cavity    Dyspnea    Enthesopathy of elbow region    GERD (gastroesophageal reflux disease)    Hammer toe    Headache(784.0)    Hemiparesis affecting dominant side as late effect of cerebrovascular accident ([place])    History of kidney stones    Hyperlipidemia    Hypertension    Hypothyroidism    Increased urinary frequency    Low compliance bladder    Macular degeneration of both eyes    Malaise and fatigue    Nonruptured cerebral aneurysm    Obesity    Pneumonia    hx   Pure hypercholesterolemia    Recurrent major depressive episodes ([place])    Seizure disorder, secondary ([place])    2-3 focal siezures monthly   Seizures ([place])    Short-term memory loss    since stroke   Skin  sensation disturbance    Stroke [facility]) 2011   rt sided weakness   Swelling of limb    TMJ (temporomandibular joint disorder)    Urinary tract infectious disease    Vertigo as late effect of stroke    Vitamin D deficiency     Current Outpatient Medications:    albuterol (PROVENTIL) (2.5 MG/3ML) 0.083% nebulizer solution, Take 3 mLs (2.5 mg total) by nebulization every 6 (six) hours., Disp: 1080 mL, Rfl: 1   albuterol (VENTOLIN HFA) 108 (90 Base) MCG/ACT inhaler, Inhale 2 puffs into the lungs every 6 (six) hours as needed for wheezing or shortness of breath., Disp: 24 g, Rfl: 3   ALPRAZolam (XANAX) 0.25 MG tablet, TAKE 1 TABLET TWICE A DAY AS NEEDED, Disp: 60 tablet, Rfl: 2   aspirin EC 81 MG tablet, Take 81 mg by mouth daily., Disp: , Rfl:    Biotin 1 MG CAPS, Take 1 tablet by mouth 2 (  two) times daily. Viviscal, Disp: , Rfl:    Calcium-Magnesium-Zinc (CAL-MAG-ZINC PO), Take 1 tablet by mouth daily., Disp: , Rfl:    Cholecalciferol (VITAMIN D-3) 1000 units CAPS, Take 2,000 Units by mouth at bedtime., Disp: , Rfl:    citalopram (CELEXA) 40 MG tablet, TAKE 1 TABLET AT BEDTIME, Disp: 90 tablet, Rfl: 3   clopidogrel (PLAVIX) 75 MG tablet, TAKE 1 TABLET DAILY, Disp: 90 tablet, Rfl: 3   diclofenac Sodium (VOLTAREN) 1 % GEL, Voltaren 1 % topical gel  APPLY 2 GRAM TO THE AFFECTED AREA(S) BY TOPICAL ROUTE 4 TIMES PER DAY, Disp: 350 g, Rfl: 2   doxycycline (VIBRA-TABS) 100 MG tablet, Take 1 tablet (100 mg total) by mouth 2 (two) times daily., Disp: 20 tablet, Rfl: 0   fluticasone (FLONASE) 50 MCG/ACT nasal spray, Place 2 sprays into both nostrils daily as needed (allergies or post-nasl drip). , Disp: , Rfl: 3   levETIRAcetam (KEPPRA) 500 MG tablet, Take 1 tablet (500 mg total) by mouth every 12 (twelve) hours., Disp: 180 tablet, Rfl: 3   levothyroxine (SYNTHROID) 50 MCG tablet, TAKE 1 TABLET ON Saturday and Sunday, Disp: 39 tablet, Rfl: 3   levothyroxine (SYNTHROID) 75 MCG tablet, TAKE 1 TABLET IN THE  MORNING BEFORE BREAKFAST MONDAY THROUGH FRIDAY (TAKE 50 MCG ON OTHER DAYS), Disp: 51 tablet, Rfl: 4   methocarbamol (ROBAXIN) 500 MG tablet, Take 1 tablet (500 mg total) by mouth 4 (four) times daily., Disp: 90 tablet, Rfl: 1   Multiple Vitamins-Minerals (PRESERVISION/LUTEIN PO), Take 1 tablet by mouth daily., Disp: , Rfl:    NONFORMULARY OR COMPOUNDED ITEM, Apply 240 mg topically daily as needed., Disp: 1 each, Rfl: 3   Omega-3 Fatty Acids (FISH OIL) 1000 MG CAPS, Take 2,000 mg by mouth 2 (two) times daily. , Disp: , Rfl:    Polyethyl Glycol-Propyl Glycol (SYSTANE) 0.4-0.3 % SOLN, Place 1 drop into both eyes daily as needed (dry eyes)., Disp: , Rfl:    Psyllium (FIBER) 28.3 % POWD, Take 10 mLs by mouth 2 (two) times daily., Disp: , Rfl:    rosuvastatin (CRESTOR) 20 MG tablet, TAKE 1 TABLET DAILY (REPLACES 10 MG DOSE), Disp: 90 tablet, Rfl: 3   telmisartan (MICARDIS) 20 MG tablet, TAKE 1 TABLET DAILY, Disp: 90 tablet, Rfl: 3   vitamin C (ASCORBIC ACID) 500 MG tablet, Take 1,000 mg by mouth at bedtime. Rose hips, Disp: , Rfl:   Social History   Tobacco Use  Smoking Status Former   Packs/day: 2.00   Years: 30.00   Total pack years: 60.00   Types: Cigarettes   Quit date: 11/27/2009   Years since quitting: 12.3  Smokeless Tobacco Never  Tobacco Comments   smoked 2.5 ppd x 5 years, 1ppd x 20+ years    Allergies  Allergen Reactions   Phenobarbital Hives, Shortness Of Breath and Swelling   Phenytoin Anaphylaxis, Hives and Swelling   Objective:  There were no vitals filed for this visit. There is no height or weight on file to calculate BMI. Constitutional Well developed. Well nourished.  Vascular Dorsalis pedis pulses palpable faintly bilaterally. Posterior tibial pulses palpable faintly bilaterally. Capillary refill diminished to all digits.  No cyanosis or clubbing noted. Pedal hair growth normal.  Neurologic Normal speech. Oriented to person, place, and time. Epicritic sensation  to light touch grossly present bilaterally.  Dermatologic Nails well groomed and normal in appearance. No open wounds. No skin lesions.  Orthopedic: Right fifth digit r hyperkeratotic lesion with underlying hammertoe  contracture.  Semiflexible in nature   Radiographs: None Assessment:   1. Vascular abnormality   2. Porokeratosis   3. Hammertoe of right foot     Plan:  Patient was evaluated and treated and all questions answered.  Right fifth digit hammertoe contracture with underlying him heloma molle/porokeratosis -I explained the patient the etiology of hammertoe contracture worse treatment options were discussed.  I discussed with her that shoe gear modification can help take the pressure from in between the toes as well as around the toe to take the pain away.  I discussed shoe gear modification extensive detail she may also have a questionable flow to the right lower extremity leading to some of the pain that she is experiencing.  Some of the redness may be attributed to dependent rubor as opposed to infectious.  I believe she will benefit from ABIs PVRs. -ABI PVRs were ordered -Patient may need a surgical intervention if this continues to regress  No follow-ups on file.

## 2022-03-18 ENCOUNTER — Other Ambulatory Visit: Payer: Self-pay | Admitting: Podiatry

## 2022-03-18 DIAGNOSIS — I999 Unspecified disorder of circulatory system: Secondary | ICD-10-CM

## 2022-03-28 ENCOUNTER — Ambulatory Visit (HOSPITAL_COMMUNITY)
Admission: RE | Admit: 2022-03-28 | Discharge: 2022-03-28 | Disposition: A | Discharge status: Home / Self Care | Payer: Medicare Other | Source: Ambulatory Visit | Attending: Cardiology | Admitting: Cardiology

## 2022-03-28 DIAGNOSIS — M79672 Pain in left foot: Secondary | ICD-10-CM | POA: Diagnosis not present

## 2022-03-28 DIAGNOSIS — M79671 Pain in right foot: Secondary | ICD-10-CM

## 2022-03-28 DIAGNOSIS — I999 Unspecified disorder of circulatory system: Secondary | ICD-10-CM | POA: Diagnosis not present

## 2022-03-29 DIAGNOSIS — H353211 Exudative age-related macular degeneration, right eye, with active choroidal neovascularization: Secondary | ICD-10-CM | POA: Diagnosis not present

## 2022-03-29 DIAGNOSIS — H43823 Vitreomacular adhesion, bilateral: Secondary | ICD-10-CM | POA: Diagnosis not present

## 2022-03-29 DIAGNOSIS — H353122 Nonexudative age-related macular degeneration, left eye, intermediate dry stage: Secondary | ICD-10-CM | POA: Diagnosis not present

## 2022-03-31 ENCOUNTER — Encounter: Payer: Self-pay | Admitting: Adult Health

## 2022-03-31 ENCOUNTER — Encounter: Payer: Self-pay | Admitting: Internal Medicine

## 2022-03-31 ENCOUNTER — Other Ambulatory Visit: Payer: Self-pay | Admitting: Internal Medicine

## 2022-03-31 NOTE — Telephone Encounter (Signed)
This was addressed back in June via [place]. She has been on flexeril long term by PCP. I did place refill previously as a courtesy but she was advised to follow-up with her PCP for additional refills. Please advise her to request this from her PCP. Thank you.

## 2022-04-02 ENCOUNTER — Encounter: Payer: Self-pay | Admitting: Internal Medicine

## 2022-04-05 ENCOUNTER — Other Ambulatory Visit: Payer: Self-pay | Admitting: Internal Medicine

## 2022-04-05 MED ORDER — TIZANIDINE HCL 4 MG PO TABS: ORAL_TABLET | ORAL | 1 refills | Status: DC

## 2022-04-20 ENCOUNTER — Ambulatory Visit (INDEPENDENT_AMBULATORY_CARE_PROVIDER_SITE_OTHER): Payer: Medicare Other | Admitting: Podiatry

## 2022-04-20 DIAGNOSIS — M2041 Other hammer toe(s) (acquired), right foot: Secondary | ICD-10-CM | POA: Diagnosis not present

## 2022-04-20 DIAGNOSIS — M205X1 Other deformities of toe(s) (acquired), right foot: Secondary | ICD-10-CM | POA: Diagnosis not present

## 2022-04-20 DIAGNOSIS — I999 Unspecified disorder of circulatory system: Secondary | ICD-10-CM

## 2022-04-20 NOTE — Progress Notes (Addendum)
  Subjective:  Patient ID: [name], female    DOB: 1956-08-04,  MRN: [id number]  Chief Complaint  Patient presents with   Toe Pain    Right foot toe pain follow up    66 y.o. female returns today for planned flexor tenotomy of the fifth right digit.  Objective:  There were no vitals filed for this visit.  General AA&O x3. Normal mood and affect.  Vascular Pedal pulses palpable.  Neurologic Epicritic sensation grossly intact.  Dermatologic Pre-ulcerative callus at the tip of the right, 5th toe  Orthopedic: Semi-reducible hammertoe deformity right, 5th toe    Assessment & Plan:  Patient was evaluated and treated and all questions answered.  Hammertoe right fifth with pre-ulcerative callus -Flexor tenotomy as below. -Advised to remove the dressing in 24 hours and apply a band-aid and triple abx ointment every day thereafter. -Also discussed shoe gear modification padding offloading.  She would like to proceed with the procedure -ABIs PVRs were reviewed which shows normal flow to the lower extremityTherefore I feel comfortable proceeding with this procedure  Procedure: Flexor Tenotomy Indication for Procedure: toe with semi-reducible hammertoe with distal tip ulceration. Flexor tenotomy indicated to alleviate contracture, reduce pressure, and enhance healing of the ulceration. Location: right, 5th toe Anesthesia: Lidocaine 1% plain; 1.5 mL and Marcaine 0.5% plain; 1.5 mL digital block Instrumentation: 11 blade Technique: The toe was anesthetized as above and prepped in the usual fashion. The toe was exsanquinated and a tourniquet was secured at the base of the toe. An 11 blade was then used to percutaneously release the flexor tendon at the plantar surface of the toe with noted release of the hammertoe deformity.  Incision was closed with 3-0 Prolene the incision was then dressed with antibiotic ointment and band-aid. Compression splint dressing applied. Patient tolerated the  procedure well. Dressing: Dry, sterile, compression dressing. Disposition: Patient tolerated procedure well. Patient to return in 1 week for follow-up.      No follow-ups on file.

## 2022-04-29 ENCOUNTER — Telehealth: Payer: Self-pay

## 2022-04-29 NOTE — Telephone Encounter (Signed)
No additional notes are needed from me

## 2022-05-04 ENCOUNTER — Ambulatory Visit (INDEPENDENT_AMBULATORY_CARE_PROVIDER_SITE_OTHER): Payer: Medicare Other | Admitting: Podiatry

## 2022-05-04 DIAGNOSIS — M2041 Other hammer toe(s) (acquired), right foot: Secondary | ICD-10-CM

## 2022-05-04 DIAGNOSIS — M205X1 Other deformities of toe(s) (acquired), right foot: Secondary | ICD-10-CM

## 2022-05-04 NOTE — Progress Notes (Signed)
  Subjective:  Patient ID: [name], female    DOB: Oct 08, 1955,  MRN: [id number]  Chief Complaint  Patient presents with   Toe Pain   66 y.o. female returns today for follow-up of flexor tenotomy of the fifth digit.  She states she is doing a lot better.  She has noticed some reduction in deformity.  Objective:  There were no vitals filed for this visit.  General AA&O x3. Normal mood and affect.  Vascular Pedal pulses palpable.  Neurologic Epicritic sensation grossly intact.  Dermatologic No further pre-ulcerative callus at the tip of the right, 5th toe  Orthopedic: No further semi-reducible hammertoe deformity right, 5th toe    Assessment & Plan:  Patient was evaluated and treated and all questions answered.  Hammertoe right fifth with pre-ulcerative callus -Tenotomy site clinically healed.  Good reduction of hammertoe contracture noted.  At this time patient can transition out of surgical shoe and going to regular shoes.  If any foot and ankle issues arise future of asked her to come back and see me.  She states understanding    No follow-ups on file.

## 2022-05-18 ENCOUNTER — Ambulatory Visit (INDEPENDENT_AMBULATORY_CARE_PROVIDER_SITE_OTHER): Payer: Medicare Other | Admitting: Internal Medicine

## 2022-05-18 ENCOUNTER — Encounter: Payer: Self-pay | Admitting: Internal Medicine

## 2022-05-18 VITALS — BP 124/80 | HR 82 | Temp 98.3°F | Ht 62.0 in | Wt 156.2 lb

## 2022-05-18 DIAGNOSIS — R0982 Postnasal drip: Secondary | ICD-10-CM

## 2022-05-18 DIAGNOSIS — I700 Atherosclerosis of aorta: Secondary | ICD-10-CM | POA: Diagnosis not present

## 2022-05-18 DIAGNOSIS — I2510 Atherosclerotic heart disease of native coronary artery without angina pectoris: Secondary | ICD-10-CM

## 2022-05-18 DIAGNOSIS — J309 Allergic rhinitis, unspecified: Secondary | ICD-10-CM | POA: Diagnosis not present

## 2022-05-18 DIAGNOSIS — E039 Hypothyroidism, unspecified: Secondary | ICD-10-CM | POA: Diagnosis not present

## 2022-05-18 DIAGNOSIS — Z23 Encounter for immunization: Secondary | ICD-10-CM | POA: Diagnosis not present

## 2022-05-18 DIAGNOSIS — H353211 Exudative age-related macular degeneration, right eye, with active choroidal neovascularization: Secondary | ICD-10-CM | POA: Diagnosis not present

## 2022-05-18 DIAGNOSIS — E559 Vitamin D deficiency, unspecified: Secondary | ICD-10-CM | POA: Diagnosis not present

## 2022-05-18 DIAGNOSIS — G8111 Spastic hemiplegia affecting right dominant side: Secondary | ICD-10-CM

## 2022-05-18 DIAGNOSIS — Z862 Personal history of diseases of the blood and blood-forming organs and certain disorders involving the immune mechanism: Secondary | ICD-10-CM

## 2022-05-18 DIAGNOSIS — F331 Major depressive disorder, recurrent, moderate: Secondary | ICD-10-CM | POA: Diagnosis not present

## 2022-05-18 DIAGNOSIS — I119 Hypertensive heart disease without heart failure: Secondary | ICD-10-CM

## 2022-05-18 MED ORDER — LORATADINE 10 MG PO TABS: 10.0000 mg | ORAL_TABLET | Freq: Every day | ORAL | 2 refills | Status: DC

## 2022-05-18 NOTE — Patient Instructions (Signed)
Hypertension, Adult ?Hypertension is another name for high blood pressure. High blood pressure forces your heart to work harder to pump blood. This can cause problems over time. ?There are two numbers in a blood pressure reading. There is a top number (systolic) over a bottom number (diastolic). It is best to have a blood pressure that is below 120/80. ?What are the causes? ?The cause of this condition is not known. Some other conditions can lead to high blood pressure. ?What increases the risk? ?Some lifestyle factors can make you more likely to develop high blood pressure: ?Smoking. ?Not getting enough exercise or physical activity. ?Being overweight. ?Having too much fat, sugar, calories, or salt (sodium) in your diet. ?Drinking too much alcohol. ?Other risk factors include: ?Having any of these conditions: ?Heart disease. ?Diabetes. ?High cholesterol. ?Kidney disease. ?Obstructive sleep apnea. ?Having a family history of high blood pressure and high cholesterol. ?Age. The risk increases with age. ?Stress. ?What are the signs or symptoms? ?High blood pressure may not cause symptoms. Very high blood pressure (hypertensive crisis) may cause: ?Headache. ?Fast or uneven heartbeats (palpitations). ?Shortness of breath. ?Nosebleed. ?Vomiting or feeling like you may vomit (nauseous). ?Changes in how you see. ?Very bad chest pain. ?Feeling dizzy. ?Seizures. ?How is this treated? ?This condition is treated by making healthy lifestyle changes, such as: ?Eating healthy foods. ?Exercising more. ?Drinking less alcohol. ?Your doctor may prescribe medicine if lifestyle changes do not help enough and if: ?Your top number is above 130. ?Your bottom number is above 80. ?Your personal target blood pressure may vary. ?Follow these instructions at home: ?Eating and drinking ? ?If told, follow the DASH eating plan. To follow this plan: ?Fill one half of your plate at each meal with fruits and vegetables. ?Fill one fourth of your plate  at each meal with whole grains. Whole grains include whole-wheat pasta, brown rice, and whole-grain bread. ?Eat or drink low-fat dairy products, such as skim milk or low-fat yogurt. ?Fill one fourth of your plate at each meal with low-fat (lean) proteins. Low-fat proteins include fish, chicken without skin, eggs, beans, and tofu. ?Avoid fatty meat, cured and processed meat, or chicken with skin. ?Avoid pre-made or processed food. ?Limit the amount of salt in your diet to less than 1,500 mg each day. ?Do not drink alcohol if: ?Your doctor tells you not to drink. ?You are pregnant, may be pregnant, or are planning to become pregnant. ?If you drink alcohol: ?Limit how much you have to: ?0-1 drink a day for women. ?0-2 drinks a day for men. ?Know how much alcohol is in your drink. In the [place] one drink equals one 12 oz bottle of beer (355 mL), one 5 oz glass of wine (148 mL), or one 1? oz glass of hard liquor (44 mL). ?Lifestyle ? ?Work with your doctor to stay at a healthy weight or to lose weight. Ask your doctor what the best weight is for you. ?Get at least 30 minutes of exercise that causes your heart to beat faster (aerobic exercise) most days of the week. This may include walking, swimming, or biking. ?Get at least 30 minutes of exercise that strengthens your muscles (resistance exercise) at least 3 days a week. This may include lifting weights or doing Pilates. ?Do not smoke or use any products that contain nicotine or tobacco. If you need help quitting, ask your doctor. ?Check your blood pressure at home as told by your doctor. ?Keep all follow-up visits. ?Medicines ?Take over-the-counter and prescription medicines   only as told by your doctor. Follow directions carefully. ?Do not skip doses of blood pressure medicine. The medicine does not work as well if you skip doses. Skipping doses also puts you at risk for problems. ?Ask your doctor about side effects or reactions to medicines that you should watch  for. ?Contact a doctor if: ?You think you are having a reaction to the medicine you are taking. ?You have headaches that keep coming back. ?You feel dizzy. ?You have swelling in your ankles. ?You have trouble with your vision. ?Get help right away if: ?You get a very bad headache. ?You start to feel mixed up (confused). ?You feel weak or numb. ?You feel faint. ?You have very bad pain in your: ?Chest. ?Belly (abdomen). ?You vomit more than once. ?You have trouble breathing. ?These symptoms may be an emergency. Get help right away. Call 911. ?Do not wait to see if the symptoms will go away. ?Do not drive yourself to the hospital. ?Summary ?Hypertension is another name for high blood pressure. ?High blood pressure forces your heart to work harder to pump blood. ?For most people, a normal blood pressure is less than 120/80. ?Making healthy choices can help lower blood pressure. If your blood pressure does not get lower with healthy choices, you may need to take medicine. ?This information is not intended to replace advice given to you by your health care provider. Make sure you discuss any questions you have with your health care provider. ?Document Revised: 06/10/2021 Document Reviewed: 06/10/2021 ?Elsevier Patient Education ? 2023 [employer] ? ?

## 2022-05-18 NOTE — Progress Notes (Signed)
[name],acting as a Education administrator for [name], MD.,have documented all relevant documentation on the behalf of [name], MD,as directed by  [name], MD while in the presence of [name], MD.    Subjective:     Patient ID: [name] , female    DOB: [birth year] , 66 y.o.   MRN: [id number]   Chief Complaint  Patient presents with   Hypertension    HPI  [name] presents today for BP check.  [name] reports compliance with meds.  [name] reports compliance with telmisartan daily.  [name] denies having any dizziness, headaches and chest pain. Patient states [name] has been having a scratchy throat for a long time along with a cough for about 3 months. [name] denies fever/chills. Cough is non-productive. Admits [name] has not been taking any allergy meds.    05/18/22 : 124/80 01/19/22 : 128/68 11/15/21 : 124/78    Hypertension This is a chronic problem. The current episode started more than 1 year ago. The problem has been gradually improving since onset. The problem is controlled. Pertinent negatives include no blurred vision, chest pain, headaches, palpitations or shortness of breath. Risk factors for coronary artery disease include post-menopausal state and sedentary lifestyle. The current treatment provides moderate improvement. Compliance problems include exercise.      Past Medical History:  Diagnosis Date   Abnormal gait    Amnesia    Anemia    hx   Aneurysm (HCC)    Anxiety    Asthma    Atrophic vaginitis    Benign hypertensive heart disease    Cardiomegaly    Chest pain    COPD (chronic obstructive pulmonary disease) ([place])     - PFTs  01/24/06 FEV1 69% ratio 68% diffusing capacity 57% with no improvement after B2   - PFT's 10/1/ 09 FEV1 77   ratio  76    dlco                   45            no resp to B2    - Nl alpha one antitripsin level 10/09   Cough    Depression    Difficulty speaking    Disorder of nasal cavity    Dyspnea    Enthesopathy  of elbow region    GERD (gastroesophageal reflux disease)    Hammer toe    Headache(784.0)    Hemiparesis affecting dominant side as late effect of cerebrovascular accident ([place])    History of kidney stones    Hyperlipidemia    Hypertension    Hypothyroidism    Increased urinary frequency    Low compliance bladder    Macular degeneration of both eyes    Malaise and fatigue    Nonruptured cerebral aneurysm    Obesity    Pneumonia    hx   Pure hypercholesterolemia    Recurrent major depressive episodes ([place])    Seizure disorder, secondary ([place])    2-3 focal siezures monthly   Seizures ([place])    Short-term memory loss    since stroke   Skin sensation disturbance    Stroke [facility]) 2011   rt sided weakness   Swelling of limb    TMJ (temporomandibular joint disorder)    Urinary tract infectious disease    Vertigo as late effect of stroke    Vitamin D deficiency      Family History  Problem Relation  Age of Onset   Heart disease Mother    Heart disease Father    Heart attack Father    Cancer Father        prostate to lymph node   Lung cancer Other    Breast cancer Neg Hx      Current Outpatient Medications:    albuterol (PROVENTIL) (2.5 MG/3ML) 0.083% nebulizer solution, Take 3 mLs (2.5 mg total) by nebulization every 6 (six) hours., Disp: 1080 mL, Rfl: 1   albuterol (VENTOLIN HFA) 108 (90 Base) MCG/ACT inhaler, Inhale 2 puffs into the lungs every 6 (six) hours as needed for wheezing or shortness of breath., Disp: 24 g, Rfl: 3   aspirin EC 81 MG tablet, Take 81 mg by mouth daily., Disp: , Rfl:    Biotin 1 MG CAPS, Take 1 tablet by mouth 2 (two) times daily. Viviscal, Disp: , Rfl:    Calcium-Magnesium-Zinc (CAL-MAG-ZINC PO), Take 1 tablet by mouth daily., Disp: , Rfl:    Cholecalciferol (VITAMIN D-3) 1000 units CAPS, Take 2,000 Units by mouth at bedtime., Disp: , Rfl:    citalopram (CELEXA) 40 MG tablet, TAKE 1 TABLET AT BEDTIME, Disp: 90 tablet, Rfl: 3   clopidogrel  (PLAVIX) 75 MG tablet, TAKE 1 TABLET DAILY, Disp: 90 tablet, Rfl: 3   diclofenac Sodium (VOLTAREN) 1 % GEL, Voltaren 1 % topical gel  APPLY 2 GRAM TO THE AFFECTED AREA(S) BY TOPICAL ROUTE 4 TIMES PER DAY, Disp: 350 g, Rfl: 2   fluticasone (FLONASE) 50 MCG/ACT nasal spray, Place 2 sprays into both nostrils daily as needed (allergies or post-nasl drip). , Disp: , Rfl: 3   levETIRAcetam (KEPPRA) 500 MG tablet, Take 1 tablet (500 mg total) by mouth every 12 (twelve) hours., Disp: 180 tablet, Rfl: 3   levothyroxine (SYNTHROID) 50 MCG tablet, TAKE 1 TABLET ON Saturday and Sunday, Disp: 39 tablet, Rfl: 3   levothyroxine (SYNTHROID) 75 MCG tablet, TAKE 1 TABLET IN THE MORNING BEFORE BREAKFAST MONDAY THROUGH FRIDAY (TAKE 50 MCG ON OTHER DAYS), Disp: 51 tablet, Rfl: 4   loratadine (CLARITIN) 10 MG tablet, Take 1 tablet (10 mg total) by mouth daily., Disp: 30 tablet, Rfl: 2   Multiple Vitamins-Minerals (PRESERVISION/LUTEIN PO), Take 1 tablet by mouth daily., Disp: , Rfl:    NONFORMULARY OR COMPOUNDED ITEM, Apply 240 mg topically daily as needed., Disp: 1 each, Rfl: 3   Omega-3 Fatty Acids (FISH OIL) 1000 MG CAPS, Take 2,000 mg by mouth 2 (two) times daily. , Disp: , Rfl:    Polyethyl Glycol-Propyl Glycol (SYSTANE) 0.4-0.3 % SOLN, Place 1 drop into both eyes daily as needed (dry eyes)., Disp: , Rfl:    Psyllium (FIBER) 28.3 % POWD, Take 10 mLs by mouth 2 (two) times daily., Disp: , Rfl:    rosuvastatin (CRESTOR) 20 MG tablet, TAKE 1 TABLET DAILY (REPLACES 10 MG DOSE), Disp: 90 tablet, Rfl: 3   telmisartan (MICARDIS) 20 MG tablet, TAKE 1 TABLET DAILY, Disp: 90 tablet, Rfl: 3   tiZANidine (ZANAFLEX) 4 MG tablet, One tab po qpm prn, Disp: 60 tablet, Rfl: 1   vitamin C (ASCORBIC ACID) 500 MG tablet, Take 1,000 mg by mouth at bedtime. [place] hips, Disp: , Rfl:    XANAX 0.25 MG tablet, TAKE 1 TABLET TWICE A DAY AS NEEDED, Disp: 60 tablet, Rfl: 2   Allergies  Allergen Reactions   Phenobarbital Hives, Shortness Of  Breath and Swelling   Phenytoin Anaphylaxis, Hives and Swelling     Review of Systems  Constitutional: Negative.   HENT:  Positive for postnasal drip. Negative for sore throat.   Eyes: Negative.  Negative for blurred vision.  Respiratory:  Positive for cough. Negative for shortness of breath.   Cardiovascular: Negative.  Negative for chest pain and palpitations.  Neurological: Negative.  Negative for headaches.  Psychiatric/Behavioral: Negative.       Today's Vitals   05/18/22 0845  BP: 124/80  Pulse: 82  Temp: 98.3 F (36.8 C)  TempSrc: Oral  Weight: 156 lb 3.2 oz (70.9 kg)  Height: _0  (1.575 m)  PainSc: 0-No pain   Body mass index is 28.57 kg/m.  Wt Readings from Last 3 Encounters:  05/18/22 156 lb 3.2 oz (70.9 kg)  11/15/21 160 lb 6.4 oz (72.8 kg)  11/08/21 162 lb 11.2 oz (73.8 kg)     Objective:  Physical Exam Vitals and nursing note reviewed.  Constitutional:      Appearance: Normal appearance.  HENT:     Head: Normocephalic and atraumatic.  Eyes:     Extraocular Movements: Extraocular movements intact.  Cardiovascular:     Rate and Rhythm: Normal rate and regular rhythm.     Heart sounds: Normal heart sounds.  Pulmonary:     Effort: Pulmonary effort is normal.     Breath sounds: No rales.  Musculoskeletal:     Comments: Ambulatory with four prong cane  Skin:    General: Skin is warm.  Neurological:     Mental Status: [name] is alert and oriented to person, place, and time.     Gait: Gait abnormal.  Psychiatric:        Mood and Affect: Mood normal.        Behavior: Behavior normal.      Assessment And Plan:     1. Hypertensive heart disease without heart failure Comments: Chronic, well controlled. [name] is encouraged to follow low sodium diet.  - Lipid panel - CMP14+EGFR - CBC no Diff  2. Atherosclerosis of aorta (HCC) Comments: Chronic, LDL goal <70. [name] is encouraged to continue with ASA and statin therapy.  - Lipid panel  3. Allergic  rhinitis with postnasal drip Comments: I will send loratadine 62m to her local pharmacy. If effective, will send to mail order. Also advised to avoid dairy products while symptomatic.   4. Major depressive disorder, recurrent episode, moderate (HCC) Comments: Chronic, [name] will continue with escitalopram for now. PHQ-9 performed, will also refer her to Psych for med management. [name] is agreeable to tx plan.  - Ambulatory referral to Psychiatry  5. Primary hypothyroidism Comments: I will check thyroid panel and adjust meds as needed. - TSH + free T4  6. Exudative age-related macular degeneration of right eye with active choroidal neovascularization ([place] Comments: Chronic, as per Ophthalmology. [name] is encouraged to keep f/u appts.   7. 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  [name] is now ambulatory with cane.  8. Vitamin D deficiency disease Comments: I will check vitamin D level and supplement as needed.  - Vitamin D (25 hydroxy)  9. History of anemia due to vitamin B12 deficiency Comments: [name] is now feeling more fatigued. I will check vitamin B12 level today.  - Vitamin B12  10. Immunization due - Flu Vaccine QUAD High Dose(Fluad) - Pneumococcal conjugate vaccine 20-valent (Prevnar 20)   Patient was given opportunity to ask questions. Patient verbalized understanding of the plan and was able to repeat key elements of the plan. All questions  were answered to their satisfaction.   I, [name], MD, have reviewed all documentation for this visit. The documentation on 05/18/22 for the exam, diagnosis, procedures, and orders are all accurate and complete.   IF YOU HAVE BEEN REFERRED TO A SPECIALIST, IT MAY TAKE 1-2 WEEKS TO SCHEDULE/PROCESS THE REFERRAL. IF YOU HAVE NOT HEARD FROM US/SPECIALIST IN TWO WEEKS, PLEASE GIVE [place] A CALL AT [phone].   THE PATIENT IS ENCOURAGED TO PRACTICE SOCIAL DISTANCING  DUE TO THE COVID-19 PANDEMIC.

## 2022-05-19 LAB — CMP14+EGFR
ALT: 22 IU/L (ref 0–32)
AST: 24 IU/L (ref 0–40)
Albumin/Globulin Ratio: 2.1 (ref 1.2–2.2)
Albumin: 4.5 g/dL (ref 3.9–4.9)
Alkaline Phosphatase: 104 IU/L (ref 44–121)
BUN/Creatinine Ratio: 19 (ref 12–28)
BUN: 12 mg/dL (ref 8–27)
Bilirubin Total: 0.7 mg/dL (ref 0.0–1.2)
CO2: 25 mmol/L (ref 20–29)
Calcium: 9.4 mg/dL (ref 8.7–10.3)
Chloride: 102 mmol/L (ref 96–106)
Creatinine, Ser: 0.64 mg/dL (ref 0.57–1.00)
Globulin, Total: 2.1 g/dL (ref 1.5–4.5)
Glucose: 97 mg/dL (ref 70–99)
Potassium: 3.9 mmol/L (ref 3.5–5.2)
Sodium: 142 mmol/L (ref 134–144)
Total Protein: 6.6 g/dL (ref 6.0–8.5)
eGFR: 98 mL/min/{1.73_m2} (ref >59)

## 2022-05-19 LAB — CBC
Hematocrit: 43.7 % (ref 34.0–46.6)
Hemoglobin: 13.7 g/dL (ref 11.1–15.9)
MCH: 29.1 pg (ref 26.6–33.0)
MCHC: 31.4 g/dL — ABNORMAL LOW (ref 31.5–35.7)
MCV: 93 fL (ref 79–97)
Platelets: 205 10*3/uL (ref 150–450)
RBC: 4.71 x10E6/uL (ref 3.77–5.28)
RDW: 14.1 % (ref 11.7–15.4)
WBC: 7.6 10*3/uL (ref 3.4–10.8)

## 2022-05-19 LAB — TSH+FREE T4
Free T4: 1.08 ng/dL (ref 0.82–1.77)
TSH: 0.59 u[IU]/mL (ref 0.450–4.500)

## 2022-05-19 LAB — VITAMIN B12: Vitamin B-12: 1396 pg/mL — ABNORMAL HIGH (ref 232–1245)

## 2022-05-19 LAB — LIPID PANEL
Chol/HDL Ratio: 1.9 ratio (ref 0.0–4.4)
Cholesterol, Total: 118 mg/dL (ref 100–199)
HDL: 63 mg/dL (ref >39)
LDL Chol Calc (NIH): 35 mg/dL (ref 0–99)
Triglycerides: 110 mg/dL (ref 0–149)
VLDL Cholesterol Cal: 20 mg/dL (ref 5–40)

## 2022-05-19 LAB — VITAMIN D 25 HYDROXY (VIT D DEFICIENCY, FRACTURES): Vit D, 25-Hydroxy: 45.4 ng/mL (ref 30.0–100.0)

## 2022-05-20 ENCOUNTER — Encounter: Payer: Self-pay | Admitting: Internal Medicine

## 2022-06-04 ENCOUNTER — Other Ambulatory Visit: Payer: Self-pay | Admitting: Internal Medicine

## 2022-06-04 MED ORDER — CYCLOBENZAPRINE HCL 5 MG PO TABS: ORAL_TABLET | ORAL | 0 refills | Status: DC

## 2022-06-07 DIAGNOSIS — H35033 Hypertensive retinopathy, bilateral: Secondary | ICD-10-CM | POA: Diagnosis not present

## 2022-06-07 DIAGNOSIS — H353122 Nonexudative age-related macular degeneration, left eye, intermediate dry stage: Secondary | ICD-10-CM | POA: Diagnosis not present

## 2022-06-07 DIAGNOSIS — H43823 Vitreomacular adhesion, bilateral: Secondary | ICD-10-CM | POA: Diagnosis not present

## 2022-06-07 DIAGNOSIS — H353211 Exudative age-related macular degeneration, right eye, with active choroidal neovascularization: Secondary | ICD-10-CM | POA: Diagnosis not present

## 2022-06-09 ENCOUNTER — Ambulatory Visit: Payer: Medicare Other | Admitting: Pulmonary Disease

## 2022-06-13 ENCOUNTER — Other Ambulatory Visit: Payer: Self-pay | Admitting: Internal Medicine

## 2022-06-13 DIAGNOSIS — E039 Hypothyroidism, unspecified: Secondary | ICD-10-CM

## 2022-06-14 ENCOUNTER — Ambulatory Visit (INDEPENDENT_AMBULATORY_CARE_PROVIDER_SITE_OTHER): Payer: Medicare Other | Admitting: Pulmonary Disease

## 2022-06-14 ENCOUNTER — Encounter: Payer: Self-pay | Admitting: Pulmonary Disease

## 2022-06-14 VITALS — BP 106/82 | HR 92 | Temp 98.5°F | Ht 62.0 in | Wt 154.6 lb

## 2022-06-14 DIAGNOSIS — Z87891 Personal history of nicotine dependence: Secondary | ICD-10-CM | POA: Diagnosis not present

## 2022-06-14 NOTE — Patient Instructions (Signed)
Follow-up in 6 months  Obtain a low-dose CT before next visit   Call us with significant concerns  Continue breathing treatments

## 2022-06-14 NOTE — Progress Notes (Signed)
Subjective:    Patient ID: [name], female    DOB: 01/15/1956, 66 y.o.   MRN: [id number]  Follow-up for chronic obstructive pulmonary disease  Breathing feels about the same Only uses nebulization treatment as needed More so in the summer months  Quit smoking in 2011  Has a history of chronic obstructive pulmonary disease Did have a CVA in 2011 Prolonged recovery  Continues on oxygen supplementation at night Previous PFT shows obstructive disease with significant bronchodilator response   She remembers being on oxygen prior to her stroke, remembers a diagnosis of COPD/emphysema She was using inhalers and nebulizers prior to that  Currently able to ambulate, does get short of breath with exertion Uses a walker, unsteady, if she goes slowly she is able to take of 100 steps  She does have daytime fatigue and sleepiness Multiple medications may be contributing to this   Comorbidities include hypertension, hypercholesterolemia, chronic headaches, CVA     Past Medical History:  Diagnosis Date   Abnormal gait    Amnesia    Anemia    hx   Aneurysm (HCC)    Anxiety    Asthma    Atrophic vaginitis    Benign hypertensive heart disease    Cardiomegaly    Chest pain    COPD (chronic obstructive pulmonary disease) ([place])     - PFTs  01/24/06 FEV1 69% ratio 68% diffusing capacity 57% with no improvement after B2   - PFT's 10/1/ 09 FEV1 77   ratio  76    dlco                   45            no resp to B2    - Nl alpha one antitripsin level 10/09   Cough    Depression    Difficulty speaking    Disorder of nasal cavity    Dyspnea    Enthesopathy of elbow region    GERD (gastroesophageal reflux disease)    Hammer toe    Headache(784.0)    Hemiparesis affecting dominant side as late effect of cerebrovascular accident ([place])    History of kidney stones    Hyperlipidemia    Hypertension    Hypothyroidism    Increased urinary frequency    Low compliance bladder     Macular degeneration of both eyes    Malaise and fatigue    Nonruptured cerebral aneurysm    Obesity    Pneumonia    hx   Pure hypercholesterolemia    Recurrent major depressive episodes ([place])    Seizure disorder, secondary ([place])    2-3 focal siezures monthly   Seizures ([place])    Short-term memory loss    since stroke   Skin sensation disturbance    Stroke ([place]) 2011   rt sided weakness   Swelling of limb    TMJ (temporomandibular joint disorder)    Urinary tract infectious disease    Vertigo as late effect of stroke    Vitamin D deficiency    Social History   Socioeconomic History   Marital status: Married    Spouse name: Jori Moll   Number of children: 2   Years of education: 14   Highest education level: Not on file  Occupational History   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    Drug use: No   Sexual activity: Not Currently  Other Topics Concern   Not on file  Social History Narrative   Patient lives at home with her husband Jori Moll).   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    Patient is on disability.    Patient has an Associates degree.   Patient is right-handed.   Patient drinks 2 cups of green tea most days.               Social Determinants of Health   Financial Resource Strain: Low Risk  (09/22/2021)   Overall Financial Resource Strain (CARDIA)    Difficulty of Paying Living Expenses: Not hard at all  Food Insecurity: No Food  Insecurity (09/22/2021)   Hunger Vital Sign    Worried About Running Out of Food in the Last Year: Never true    Ran Out of Food in the Last Year: Never true  Transportation Needs: No Transportation Needs (09/22/2021)   PRAPARE - Hydrologist (Medical): No    Lack of Transportation (Non-Medical): No  Physical Activity: Inactive (09/22/2021)   Exercise Vital Sign    Days of Exercise per Week: 0 days    Minutes of Exercise per Session: 0 min  Stress: Stress Concern Present (09/22/2021)   [place]    Feeling of Stress : To some extent  Social Connections: Not on file  Intimate Partner Violence: Not on file   Family History  Problem Relation Age of Onset   Heart disease Mother    Heart disease Father    Heart attack Father    Cancer Father        prostate to lymph node   Lung cancer Other    Breast cancer Neg Hx    Review of Systems  Constitutional:  Negative for fever and unexpected weight change.  HENT:  Positive for dental problem, nosebleeds and sneezing. Negative for congestion, ear pain, postnasal drip, rhinorrhea, sinus pressure, sore throat and trouble swallowing.   Eyes:  Negative for redness and itching.  Respiratory:  Positive for cough and shortness of breath. Negative for chest tightness and wheezing.   Cardiovascular:  Negative for palpitations.  Gastrointestinal:  Negative for nausea and vomiting.  Genitourinary:  Negative for dysuria.  Musculoskeletal:  Positive for joint swelling.  Skin:  Negative for rash.  Allergic/Immunologic: Negative.  Negative for environmental allergies, food allergies and immunocompromised state.  Psychiatric/Behavioral:  Negative for dysphoric mood.        Objective:   Physical Exam Constitutional:      Appearance: Normal appearance.  HENT:     Head: Normocephalic and atraumatic.     Mouth/Throat:     Mouth: Mucous membranes are moist.   Cardiovascular:     Rate and Rhythm: Normal rate and regular rhythm.     Pulses: Normal pulses.     Heart sounds: Normal  heart sounds. No murmur heard.    No friction rub.  Pulmonary:     Effort: Pulmonary effort is normal. No respiratory distress.     Breath sounds: Normal breath sounds. No stridor. No wheezing or rhonchi.  Musculoskeletal:     Cervical back: Normal range of motion. No rigidity or tenderness.  Neurological:     Mental Status: She is alert.  Psychiatric:        Mood and Affect: Mood normal.    Vitals:   06/14/22 1512  BP: 106/82  Pulse: 92  Temp: 98.5 F (36.9 C)  SpO2: 95%    Recent PFT reviewed showing small airway disease Nocturnal oximetry with nocturnal desaturations  Low-dose CT scan of the chest performed January 2022-no lung nodules, some evidence of bronchiectasis    Assessment & Plan:  .  History of COPD  .  Nocturnal desaturate on oxygen at night  .  Has had no recent exacerbations  .  Periodic use of nebulizer   .  Continues to stay active .  Obtain a low-dose CT prior to next visit in 6 months  .  Encouraged to give [place] a call if she has any significant concerns

## 2022-06-14 NOTE — Progress Notes (Signed)
Subjective:    Patient ID: [name], female    DOB: 04-29-1956, 66 y.o.   MRN: [id number]  She feels better overall  Breathing feels about the same Uses nebulization treatments as needed and he does continue to have  Quit smoking 2011 Past history of COPD  She did have a brain bleed in 2011-2 events, had stents placed Prolonged recovery  Continues on oxygen supplementation at night Previous PFT shows obstructive disease with significant bronchodilator response  There may be a component of deconditioning as she has not been very active since Covid  She remembers being on oxygen prior to her stroke, remembers a diagnosis of COPD/emphysema She was using inhalers and nebulizers prior to that  Currently able to ambulate, does get short of breath with exertion Uses a walker, unsteady, if she goes slowly she is able to take of 100 steps  She does have daytime fatigue and sleepiness Multiple medications may be contributing to this   Comorbidities include hypertension, hypercholesterolemia, chronic headaches, CVA     Past Medical History:  Diagnosis Date   Abnormal gait    Amnesia    Anemia    hx   Aneurysm ([place])    Anxiety    Asthma    Atrophic vaginitis    Benign hypertensive heart disease    Cardiomegaly    Chest pain    COPD (chronic obstructive pulmonary disease) ([place])     - PFTs  01/24/06 FEV1 69% ratio 68% diffusing capacity 57% with no improvement after B2   - PFT's 10/1/ 09 FEV1 77   ratio  76    dlco                   45            no resp to B2    - Nl alpha one antitripsin level 10/09   Cough    Depression    Difficulty speaking    Disorder of nasal cavity    Dyspnea    Enthesopathy of elbow region    GERD (gastroesophageal reflux disease)    Hammer toe    Headache(784.0)    Hemiparesis affecting dominant side as late effect of cerebrovascular accident ([place])    History of kidney stones    Hyperlipidemia    Hypertension    Hypothyroidism     Increased urinary frequency    Low compliance bladder    Macular degeneration of both eyes    Malaise and fatigue    Nonruptured cerebral aneurysm    Obesity    Pneumonia    hx   Pure hypercholesterolemia    Recurrent major depressive episodes ([place])    Seizure disorder, secondary ([place])    2-3 focal siezures monthly   Seizures ([place])    Short-term memory loss    since stroke   Skin sensation disturbance    Stroke ([place]) 2011   rt sided weakness   Swelling of limb    TMJ (temporomandibular joint disorder)    Urinary tract infectious disease    Vertigo as late effect of stroke    Vitamin D deficiency    Social History   Socioeconomic History   Marital status: Married    Spouse name: Jori Moll   Number of children: 2   Years of education: 14   Highest education level: Not on file  Occupational History   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    Drug use: No   Sexual activity: Not Currently  Other Topics Concern   Not on file  Social History Narrative   Patient lives at home with her husband Jori Moll).   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    Patient is on disability.    Patient has an Associates degree.   Patient is right-handed.   Patient drinks 2 cups of green tea most days.               Social Determinants of Health   Financial Resource Strain: Low Risk  (09/22/2021)   Overall Financial Resource Strain (CARDIA)    Difficulty of Paying Living  Expenses: Not hard at all  Food Insecurity: No Food Insecurity (09/22/2021)   Hunger Vital Sign    Worried About Running Out of Food in the Last Year: Never true    Ran Out of Food in the Last Year: Never true  Transportation Needs: No Transportation Needs (09/22/2021)   PRAPARE - Hydrologist (Medical): No    Lack of Transportation (Non-Medical): No  Physical Activity: Inactive (09/22/2021)   Exercise Vital Sign    Days of Exercise per Week: 0 days    Minutes of Exercise per Session: 0 min  Stress: Stress Concern Present (09/22/2021)   [place]    Feeling of Stress : To some extent  Social Connections: Not on file  Intimate Partner Violence: Not on file   Family History  Problem Relation Age of Onset   Heart disease Mother    Heart disease Father    Heart attack Father    Cancer Father        prostate to lymph node   Lung cancer Other    Breast cancer Neg Hx    Review of Systems  Constitutional:  Negative for fever and unexpected weight change.  HENT:  Positive for dental problem, nosebleeds and sneezing. Negative for congestion, ear pain, postnasal drip, rhinorrhea, sinus pressure, sore throat and trouble swallowing.   Eyes:  Negative for redness and itching.  Respiratory:  Positive for cough and shortness of breath. Negative for chest tightness and wheezing.   Cardiovascular:  Negative for palpitations.  Gastrointestinal:  Negative for nausea and vomiting.  Genitourinary:  Negative for dysuria.  Musculoskeletal:  Positive for joint swelling.  Skin:  Negative for rash.  Allergic/Immunologic: Negative.  Negative for environmental allergies, food allergies and immunocompromised state.  Psychiatric/Behavioral:  Negative for dysphoric mood.        Objective:   Physical Exam Constitutional:      Appearance: Normal appearance.  HENT:     Head: Normocephalic and atraumatic.   Cardiovascular:     Rate and Rhythm: Normal rate and regular rhythm.     Pulses: Normal pulses.     Heart sounds:  Normal heart sounds. No murmur heard.    No friction rub.  Pulmonary:     Effort: Pulmonary effort is normal. No respiratory distress.     Breath sounds: Normal breath sounds. No stridor. No wheezing or rhonchi.  Musculoskeletal:     Cervical back: Normal range of motion. No rigidity or tenderness.  Neurological:     Mental Status: She is alert.  Psychiatric:        Mood and Affect: Mood normal.    Vitals:   06/14/22 1512  BP: 106/82  Pulse: 92  Temp: 98.5 F (36.9 C)  SpO2: 95%    Recent PFT reviewed showing small airway disease Nocturnal oximetry with nocturnal desaturations    Assessment & Plan:  .  Patient with past history of COPD .  Oxygen supplementation in the past .  To continue oxygen supplementation at great  .  No recent exacerbation of symptoms  .  Still has occasional cough -Not limited   Continue albuterol  Nebulization treatments as needed  .  I will see her back in about 6 months  .  Encouraged to call with any significant concerns .  Call for refills as needed

## 2022-06-15 ENCOUNTER — Other Ambulatory Visit: Payer: Self-pay

## 2022-06-15 DIAGNOSIS — E039 Hypothyroidism, unspecified: Secondary | ICD-10-CM

## 2022-06-15 MED ORDER — LEVOTHYROXINE SODIUM 50 MCG PO TABS: ORAL_TABLET | ORAL | 3 refills | Status: DC

## 2022-06-15 MED ORDER — LEVOTHYROXINE SODIUM 75 MCG PO TABS: ORAL_TABLET | ORAL | 4 refills | Status: DC

## 2022-07-04 ENCOUNTER — Encounter (HOSPITAL_COMMUNITY): Payer: Self-pay | Admitting: Psychiatry

## 2022-07-04 ENCOUNTER — Ambulatory Visit (HOSPITAL_BASED_OUTPATIENT_CLINIC_OR_DEPARTMENT_OTHER): Payer: Medicare Other | Admitting: Psychiatry

## 2022-07-04 DIAGNOSIS — F411 Generalized anxiety disorder: Secondary | ICD-10-CM | POA: Diagnosis not present

## 2022-07-04 DIAGNOSIS — F331 Major depressive disorder, recurrent, moderate: Secondary | ICD-10-CM

## 2022-07-04 DIAGNOSIS — F329 Major depressive disorder, single episode, unspecified: Secondary | ICD-10-CM | POA: Insufficient documentation

## 2022-07-04 MED ORDER — CITALOPRAM HYDROBROMIDE 10 MG PO TABS: 10.0000 mg | ORAL_TABLET | Freq: Every day | ORAL | 0 refills | Status: DC

## 2022-07-04 MED ORDER — TRAZODONE HCL 50 MG PO TABS: 50.0000 mg | ORAL_TABLET | Freq: Every evening | ORAL | 1 refills | Status: DC | PRN

## 2022-07-04 MED ORDER — ESCITALOPRAM OXALATE 5 MG PO TABS: ORAL_TABLET | ORAL | 0 refills | Status: DC

## 2022-07-04 NOTE — Patient Instructions (Addendum)
Thank you for attending your appointment today.  - Decrease Citalopram (celexa) to 30 mg at bedtime for 2 weeks and then decrease to 20 mg at bedtime -  Start Lexapro (escitalopram) 5 mg at bedtime and then increase to 10 mg at bedtime in 2 weeks - Decrease daytime Xanax (alprazolam) to 0.125 mg (1/2 tab) and continue Xanax 0.25 mg (1 tab) at bedtime - Start trazodone 25-50 mg at bedtime as needed  Please do not make any changes to medications without first discussing with your provider. If you are experiencing a psychiatric emergency, please call 911 or present to your nearest emergency department. Additional crisis, medication management, and therapy resources are included below.  [facility]  [address], [place], [place] [address] [phone] or [phone] [facility] 24/7 FOR ANYONE [address] [place], [place] Fax: [phone] [email or web address] *Interpreters available *Accepts all insurance and uninsured for Urgent Care needs

## 2022-07-04 NOTE — Progress Notes (Addendum)
Psychiatric Initial Adult Assessment   Patient Identification: [name] MRN:  [id number] Date of Evaluation:  07/04/2022 Referral Source: PCP Chief Complaint:   Chief Complaint  Patient presents with   Depression   Visit Diagnosis:    ICD-10-CM   1. Moderate episode of recurrent major depressive disorder (HCC)  F33.1 citalopram (CELEXA) 10 MG tablet    escitalopram (LEXAPRO) 5 MG tablet    traZODone (DESYREL) 50 MG tablet    2. GAD (generalized anxiety disorder)  F41.1 citalopram (CELEXA) 10 MG tablet    escitalopram (LEXAPRO) 5 MG tablet    traZODone (DESYREL) 50 MG tablet       Assessment:  [name] is a 66 y.o. y.o. 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    Patient reports symptoms of depression including anhedonia, feelings of hopelessness, increased fatigue, decreased appetite, difficulty sleeping, difficulty concentrating, and feelings of worthlessness.  She denies any SI/HI or thoughts of self-harm along with any past history of mania or psychosis.  Patient also endorsed symptoms of anxiety including feeling nervous or on edge, being unable to control her worrying, worrying too much with different things, difficulty relaxing, and irritability.  Of note patient had a left frontoparietal parenchymal hemorrhage in March 2011 which correlated with the onset of her cognitive impairment, onset of seizures, and onset of depressive symptoms.  Patient was started on Keppra around that time and we discussed the possibility that it could be contributing to her increased irritability/agitation.  Patient will plan to discuss with neurologist at her next visit.  Due to the risk of cardiac arrhythmias with Celexa doses higher than 20 mg in patients over 65 we will start a cross taper  off of Celexa and onto Lexapro.  Risk and benefits were discussed.  Also discussed the adverse effects of long-term benzodiazepine use and will start to taper off of Xanax which patient is in agreement with.  Risk and benefits were discussed.  A number of assessments were performed during the evaluation today including nutritional assessment which was 0, pain assessment which showed no pain, PHQ-9 which they scored a 19 on, GAD-7 which they scored a 16 on, and [place] suicide severity screening which showed no risk.  Based on these assessments patient would benefit from medication adjustment to better target their symptoms.   Plan: - Decrease Citalopram (celexa) to 30 mg at bedtime for 2 weeks and then decrease to 20 mg at bedtime -  Start Lexapro (escitalopram) 5 mg at bedtime and then increase to 10 mg at bedtime in 2 weeks - Decrease daytime Xanax (alprazolam) to 0.125 mg (1/2 tab) and continue Xanax 0.25 mg (1 tab) at bedtime - Start trazodone 25-50 mg at bedtime as needed - EKG reviewed QTC 462 - Discuss Keppra with neurologist - CBC, CMP, lipid profile, Vit D, and TSH reviewed - Follow up in a month   History of Present Illness: Patient presents today for an appointment reporting that she is here after her PCP encouraged her to come for management of her psychiatric medications.  [name] notes that she first started to experience depressive symptoms at following her stroke in 2011.  Prior to that she had some minor depressive issues but there was nothing significant.  Since the stroke she has found that she has been struggling with the increased depressive symptoms,  anxiety, and irritability.  She notes that she can get very emotional which can lead to becoming tearful or agitated over little things.  She gave the example of her husband moving her close without her knowing which has led to her becoming both irritable and tearful.  [name] also endorses other deficits following the stroke.  She has  had more difficulty with her memory and may have developed a seizure disorder following the stroke.  She is on a number of medications currently which she can have some difficulty remembering at times.  Patient believes that the Keppra was started for seizures while she was still hospitalized post stroke, though she is not entirely sure if she has had ever experienced seizures.  Currently she reports the closest thing to a seizure she has experienced is some eye fluttering symptoms that can last for a few seconds before resolving.  She has not really had any of these happen since she had [facility] and left carpal tunnel release in January 2023.  [name] believes the Celexa and the Xanax may have been started around the time of her stroke or potentially a year or 2 later but cannot quite remember.  She notes that the Celexa seems to help as when she does not take it she is much more tearful.  As for the Xanax that she cannot quite remember what she uses it for or if it is beneficial.  Think she might take it for stress and irritability and initially thought she was taking it only at bedtime however after going through the [place] with her she thinks maybe she is taking it twice a day.  We discussed the long-term effects of this medication and patient expressed interest in tapering off.  She also was agreeable to trying a different antidepressant medication due to Celexa having more cardiac effects at its current dose after the age of 87.  Patient will also discuss the [place] with her neurologist to confirm that it is necessary/to see if there are any alternatives as it could be contributing to her irritability.  Associated Signs/Symptoms: Depression Symptoms:  depressed mood, anhedonia, insomnia, fatigue, difficulty concentrating, impaired memory, anxiety, loss of energy/fatigue, disturbed sleep, (Hypo) Manic Symptoms:  Irritable Mood, Anxiety Symptoms:  Excessive Worry, Psychotic Symptoms:   Denies PTSD  Symptoms: NA  Past Psychiatric History: Patient reports that her psychiatric symptoms/depression started after 2011 when she had her stroke prior to that she reports minimal to no depressive symptoms.  She has been on Celexa for as long as she can remember post stroke and believes she was started on Xanax at the same time.  Patient denies any prior psychiatric hospitalizations or past suicide attempts.  She denies any current substance use since 2005 and reports drinking alcohol when she was younger.  Previous Psychotropic Medications: Yes   Substance Abuse History in the last 12 months:  No.  Consequences of Substance Abuse: NA  Past Medical History:  Past Medical History:  Diagnosis Date   Abnormal gait    Amnesia    Anemia    hx   Aneurysm (HCC)    Anxiety    Asthma    Atrophic vaginitis    Benign hypertensive heart disease    Cardiomegaly    Chest pain    COPD (chronic obstructive pulmonary disease) (HCC)     - PFTs  01/24/06 FEV1 69% ratio 68% diffusing capacity 57% with no improvement after B2   - PFT's 10/1/ 09 FEV1 77   ratio  76    dlco                   45            no resp to B2    - Nl alpha one antitripsin level 10/09   Cough    Depression    Difficulty speaking    Disorder of nasal cavity    Dyspnea    Enthesopathy of elbow region    GERD (gastroesophageal reflux disease)    Hammer toe    Headache(784.0)    Hemiparesis affecting dominant side as late effect of cerebrovascular accident [facility])    History of kidney stones    Hyperlipidemia    Hypertension    Hypothyroidism    Increased urinary frequency    Low compliance bladder    Macular degeneration of both eyes    Malaise and fatigue    Nonruptured cerebral aneurysm    Obesity    Pneumonia    hx   Pure hypercholesterolemia    Recurrent major depressive episodes (HCC)    Seizure disorder, secondary ([place])    2-3 focal siezures monthly   Seizures (HCC)    Short-term memory loss    since stroke    Skin sensation disturbance    Stroke [facility]) 2011   rt sided weakness   Swelling of limb    TMJ (temporomandibular joint disorder)    Urinary tract infectious disease    Vertigo as late effect of stroke    Vitamin D deficiency     Past Surgical History:  Procedure Laterality Date   ANTERIOR CERVICAL DECOMP/DISCECTOMY FUSION N/A 09/09/2021   Procedure: Anterior  Cervical Decompression Fusion  Cervical four-five, Cervical five-six;  Surgeon: Karsten Ro, DO;  Location: [place];  Service: Neurosurgery;  Laterality: N/A;   BACK SURGERY     CARPAL TUNNEL RELEASE Left 09/09/2021   Procedure: LEFT CARPAL TUNNEL RELEASE;  Surgeon: Karsten Ro, DO;  Location: [place];  Service: Neurosurgery;  Laterality: Left;   CEREBRAL ANGIOGRAM     x3   COLONOSCOPY WITH PROPOFOL N/A 08/18/2016   Procedure: COLONOSCOPY WITH PROPOFOL;  Surgeon: Carol Ada, MD;  Location: [facility];  Service: Endoscopy;  Laterality: N/A;   LAPAROSCOPIC HYSTERECTOMY     MANDIBLE FRACTURE SURGERY     RADIOLOGY WITH ANESTHESIA N/A 08/21/2013   Procedure: RADIOLOGY WITH ANESTHESIA;  Surgeon: Rob Hickman, MD;  Location: [place];  Service: Radiology;  Laterality: N/A;   stones     no surgery    Family Psychiatric History: Patient is unsure of any past family psychiatric history  Family History:  Family History  Problem Relation Age of Onset   Heart disease Mother    Heart disease Father    Heart attack Father    Cancer Father        prostate to lymph node   Lung cancer Other    Breast cancer Neg Hx     Social History:   Social History   Socioeconomic History   Marital status: Married    Spouse name: [name]   Number of children: 2   Years of education: 14   Highest education level: Not on file  Occupational History   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    Drug use: No   Sexual activity: Not Currently  Other Topics Concern   Not on file  Social History Narrative   Patient lives at home with her husband [name]).   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    Patient is on disability.    Patient has an Associates degree.   Patient is right-handed.   Patient drinks 2 cups of green tea most days.               Social Determinants of Health   Financial Resource Strain: Low Risk  (09/22/2021)   Overall Financial Resource Strain (CARDIA)    Difficulty of Paying Living Expenses: Not hard at all  Food Insecurity: No Food Insecurity (09/22/2021)   Hunger Vital Sign    Worried About Running Out of Food in the Last Year: Never true    Ran Out of Food in the Last Year: Never true  Transportation Needs: No Transportation Needs (09/22/2021)   PRAPARE - Hydrologist (Medical): No    Lack of Transportation (Non-Medical): No  Physical Activity: Inactive (09/22/2021)   Exercise Vital Sign    Days of Exercise per Week: 0 days    Minutes of Exercise per Session: 0 min  Stress: Stress Concern Present (09/22/2021)   [place]    Feeling of Stress : To some extent  Social Connections: Not on file    Additional Social History: Patient is married and lives with her husband.  Allergies:   Allergies  Allergen Reactions   Phenobarbital Hives, Shortness Of Breath and Swelling   Phenytoin Anaphylaxis, Hives and Swelling    Metabolic Disorder Labs: Lab Results  Component Value Date    HGBA1C 5.6 09/16/2020   No results found for: "PROLACTIN" Lab Results  Component Value Date   CHOL 118 05/18/2022   TRIG 110 05/18/2022   HDL 63 05/18/2022   CHOLHDL 1.9 05/18/2022   VLDL 15 11/23/2009   LDLCALC 35 05/18/2022   LDLCALC 52 07/26/2021   Lab Results  Component Value Date   TSH 0.590 05/18/2022    Therapeutic Level Labs: No results found for: "LITHIUM" No results found for: "CBMZ" No results found for: "VALPROATE"  Current Medications: Current Outpatient Medications  Medication Sig Dispense Refill   citalopram (CELEXA) 10 MG tablet Take 1 tablet (10 mg total) by mouth daily. Take 10 mg tab with half of a Celexa 40 mg tab for a total of 30 mg of Celexa at bedtime, for 14 days before decreasing to Celexa 20 mg at bedtime 14 tablet 0   escitalopram (LEXAPRO) 5 MG tablet Take 1 tablet (5 mg total) by mouth at bedtime for 14 days, THEN 2 tablets (10 mg total) at bedtime for 20 days. Take Lexapro 5 mg at bedtime for 14 days before increasing to 10 mg at bedtime. 54 tablet 0   traZODone (DESYREL) 50 MG tablet Take 1 tablet (50 mg total) by mouth at bedtime as  needed for sleep. 30 tablet 1   albuterol (PROVENTIL) (2.5 MG/3ML) 0.083% nebulizer solution Take 3 mLs (2.5 mg total) by nebulization every 6 (six) hours. 1080 mL 1   albuterol (VENTOLIN HFA) 108 (90 Base) MCG/ACT inhaler Inhale 2 puffs into the lungs every 6 (six) hours as needed for wheezing or shortness of breath. 24 g 3   aspirin EC 81 MG tablet Take 81 mg by mouth daily.     Biotin 1 MG CAPS Take 1 tablet by mouth 2 (two) times daily. Viviscal     Calcium-Magnesium-Zinc (CAL-MAG-ZINC PO) Take 1 tablet by mouth daily.     Cholecalciferol (VITAMIN D-3) 1000 units CAPS Take 2,000 Units by mouth at bedtime.     clopidogrel (PLAVIX) 75 MG tablet TAKE 1 TABLET DAILY 90 tablet 3   clopidogrel (PLAVIX) 75 MG tablet Take 1 tablet by mouth daily.     cyclobenzaprine (FLEXERIL) 5 MG tablet One tab po qpm prn 30 tablet 0    diclofenac Sodium (VOLTAREN) 1 % GEL Voltaren 1 % topical gel  APPLY 2 GRAM TO THE AFFECTED AREA(S) BY TOPICAL ROUTE 4 TIMES PER DAY 350 g 2   fluticasone (FLONASE) 50 MCG/ACT nasal spray Place 2 sprays into both nostrils daily as needed (allergies or post-nasl drip).   3   levETIRAcetam (KEPPRA) 500 MG tablet Take 1 tablet (500 mg total) by mouth every 12 (twelve) hours. 180 tablet 3   levothyroxine (SYNTHROID) 50 MCG tablet Take one tablet by mouth on Saturday and Sunday 39 tablet 3   levothyroxine (SYNTHROID) 75 MCG tablet TAKE 1 TABLET IN THE MORNING BEFORE BREAKFAST MONDAY THROUGH FRIDAY (TAKE 50 MCG ON OTHER DAYS) 51 tablet 4   loratadine (CLARITIN) 10 MG tablet Take 1 tablet (10 mg total) by mouth daily. 30 tablet 2   Multiple Vitamins-Minerals (PRESERVISION/LUTEIN PO) Take 1 tablet by mouth daily.     NONFORMULARY OR COMPOUNDED ITEM Apply 240 mg topically daily as needed. 1 each 3   Omega-3 Fatty Acids (FISH OIL) 1000 MG CAPS Take 2,000 mg by mouth 2 (two) times daily.      Polyethyl Glycol-Propyl Glycol (SYSTANE) 0.4-0.3 % SOLN Place 1 drop into both eyes daily as needed (dry eyes).     Psyllium (FIBER) 28.3 % POWD Take 10 mLs by mouth 2 (two) times daily.     rosuvastatin (CRESTOR) 20 MG tablet TAKE 1 TABLET DAILY (REPLACES 10 MG DOSE) 90 tablet 3   telmisartan (MICARDIS) 20 MG tablet TAKE 1 TABLET DAILY 90 tablet 3   tiZANidine (ZANAFLEX) 4 MG tablet TAKE 1 TABLET EVERY EVENING AS NEEDED 30 tablet 0   vitamin C (ASCORBIC ACID) 500 MG tablet Take 1,000 mg by mouth at bedtime. [place] hips     XANAX 0.25 MG tablet TAKE 1 TABLET TWICE A DAY AS NEEDED 60 tablet 1   No current facility-administered medications for this visit.    Musculoskeletal: Strength & Muscle Tone:  Right hemiparesis secondary to past stroke Gait & Station: unsteady Patient leans: Left  Psychiatric Specialty Exam: Review of Systems  There were no vitals taken for this visit.There is no height or weight on file to  calculate BMI.  General Appearance: Well Groomed and walks with a cane  Eye Contact:  Good  Speech:  Clear and Coherent and Normal Rate  Volume:  Normal  Mood:  Anxious and Euthymic  Affect:  Congruent  Thought Process:  Coherent  Orientation:  Full (Time, Place, and Person)  Thought  Content:  Logical  Suicidal Thoughts:  No  Homicidal Thoughts:  No  Memory:  Immediate;   Fair Recent;   Fair Remote;   Fair  Judgement:  Good  Insight:  Fair and Lacking  Psychomotor Activity:  Normal  Concentration:  Concentration: Fair  Recall:  [place] of Knowledge:Fair  Language: Good  Akathisia:  NA    AIMS (if indicated):  not done  Assets:  Communication Skills Housing Intimacy Social Support  ADL's:  Intact  Cognition: WNL  Sleep:  Fair   Screenings: GAD-7    Flowsheet Row Office Visit from 07/04/2022 in [facility]  Total GAD-7 Score 16      Mini-Mental    [place] from 09/03/2018 in [facility] Visit from 11/03/2015 in [facility] Office Visit from 05/05/2015 in [facility] Office Visit from 11/05/2014 in [facility]  Total Score (max 30 points ) '29 27 26 28      '$ PHQ2-9    [place] Visit from 07/04/2022 in [facility] Office Visit from 05/18/2022 in [facility] Office Visit from 11/15/2021 in [facility] from 09/22/2021 in [facility] from 07/26/2021 in [facility]  PHQ-2 Total Score '5 6 2 '$ 0 1  PHQ-9 Total Score '19 17 7 '$ -- [place] Visit from 07/04/2022 in [facility] Admission (Discharged) from 09/09/2021 in [place] [facility] from 09/02/2021 in  [facility] PREADMISSION TESTING  C-SSRS RISK CATEGORY No Risk No Risk No Risk        Collaboration of Care: Medication Management AEB medication prescription, Primary Care Provider AEB chart review, and Other provider involved in patient's care [facility] neurology chart review  Patient/Guardian was advised Release of Information must be obtained prior to any record release in order to collaborate their care with an outside provider. Patient/Guardian was advised if they have not already done so to contact the registration department to sign all necessary forms in order for [place] to release information regarding their care.   Consent: Patient/Guardian gives verbal consent for treatment and assignment of benefits for services provided during this visit. Patient/Guardian expressed understanding and agreed to proceed.   Vista Mink, MD 10/30/20231:54 PM

## 2022-07-18 ENCOUNTER — Ambulatory Visit
Admission: RE | Admit: 2022-07-18 | Discharge: 2022-07-18 | Disposition: A | Discharge status: Home / Self Care | Payer: Medicare Other | Source: Ambulatory Visit | Attending: Pulmonary Disease | Admitting: Pulmonary Disease

## 2022-07-18 DIAGNOSIS — Z87891 Personal history of nicotine dependence: Secondary | ICD-10-CM

## 2022-08-01 DIAGNOSIS — M47812 Spondylosis without myelopathy or radiculopathy, cervical region: Secondary | ICD-10-CM | POA: Diagnosis not present

## 2022-08-03 ENCOUNTER — Ambulatory Visit (HOSPITAL_BASED_OUTPATIENT_CLINIC_OR_DEPARTMENT_OTHER): Payer: Medicare Other | Admitting: Psychiatry

## 2022-08-03 ENCOUNTER — Encounter (HOSPITAL_COMMUNITY): Payer: Self-pay | Admitting: Psychiatry

## 2022-08-03 VITALS — BP 136/77 | HR 87 | Temp 97.8°F | Ht 62.0 in | Wt 160.0 lb

## 2022-08-03 DIAGNOSIS — F411 Generalized anxiety disorder: Secondary | ICD-10-CM

## 2022-08-03 DIAGNOSIS — F331 Major depressive disorder, recurrent, moderate: Secondary | ICD-10-CM

## 2022-08-03 MED ORDER — ALPRAZOLAM 0.25 MG PO TABS: 0.1250 mg | ORAL_TABLET | Freq: Two times a day (BID) | ORAL | 1 refills | Status: DC | PRN

## 2022-08-03 MED ORDER — CITALOPRAM HYDROBROMIDE 10 MG PO TABS: 10.0000 mg | ORAL_TABLET | Freq: Every day | ORAL | 0 refills | Status: DC

## 2022-08-03 MED ORDER — ESCITALOPRAM OXALATE 20 MG PO TABS: 20.0000 mg | ORAL_TABLET | Freq: Every day | ORAL | 0 refills | Status: DC

## 2022-08-03 MED ORDER — TRAZODONE HCL 50 MG PO TABS: 50.0000 mg | ORAL_TABLET | Freq: Every evening | ORAL | 1 refills | Status: DC | PRN

## 2022-08-03 NOTE — Progress Notes (Signed)
BH MD/PA/NP OP Progress Note  08/03/2022 10:15 AM [name]  MRN:  [id number]  Visit Diagnosis:    ICD-10-CM   1. Moderate episode of recurrent major depressive disorder (HCC)  F33.1 traZODone (DESYREL) 50 MG tablet    escitalopram (LEXAPRO) 20 MG tablet    citalopram (CELEXA) 10 MG tablet    2. GAD (generalized anxiety disorder)  F41.1 traZODone (DESYREL) 50 MG tablet    escitalopram (LEXAPRO) 20 MG tablet    citalopram (CELEXA) 10 MG tablet    ALPRAZolam (XANAX) 0.25 MG tablet      Assessment: [name] is a 66 y.o. 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    During initial evaluation patient reported symptoms of depression including anhedonia, feelings of hopelessness, increased fatigue, decreased appetite, difficulty sleeping, difficulty concentrating, and feelings of worthlessness.  She denied any SI/HI or thoughts of self-harm along with any past history of mania or psychosis.  Patient also endorsed symptoms of anxiety including feeling nervous or on edge, being unable to control her worrying, worrying too much with different things, difficulty relaxing, and irritability.  Of note patient had a left frontoparietal parenchymal hemorrhage in March 2011 which correlated with the onset of her cognitive impairment, onset of seizures, and onset of depressive symptoms.  Patient was started on Keppra around that time and we discussed the possibility that it could be contributing to her increased irritability/agitation.    [name] presents for follow-up evaluation. Today, 08/03/22, patient reports an improvement in her depressed mood, anxiety, and irritability symptoms over the past month.  She also notes having more energy throughout the day and decreased need to take multiple naps.   Patient denies any adverse side effects from the medications.  Of note she has had some difficulty sleeping at night and reports only taking the trazodone on occasion.  Risk and benefits of the trazodone were discussed and she was encouraged to use it more frequently if needed.  We will continue to cross taper off of Celexa and increase Lexapro.  Will also continue to taper off of Xanax.  Patient will follow up in 2 months.  Plan: - Decrease Citalopram (celexa) to 10 mg at bedtime for 2 weeks and then discontinue -  Increase Lexapro (escitalopram) 20 mg at bedtime  - Decrease daytime Xanax (alprazolam) to 0.125 mg (1/2 tab) and continue Xanax 0.125 mg (1/2 tab) at bedtime - Continue trazodone 25-50 mg at bedtime as needed - EKG reviewed QTC 462 - Discuss Keppra with neurologist - CBC, CMP, lipid profile, Vit D, and TSH reviewed - Follow up in a month   Chief Complaint:  Chief Complaint  Patient presents with   Follow-up   Medication Refill   HPI: [name] presents reporting that things have been going fairly well for her this past month.  She has found that her anxiety, depression, and irritability symptoms have decreased and she has not been as tired and fatigued throughout the days.  She denies any adverse side effects from the medication.  Patient does note that she has had a cough for the last week or 2, however after going through the side effect profiles of the various medications it seems unlikely that it is secondary to that at this time.  She notes that she does have a history of COPD.  Patient reports the  only negatives she has had over the past month is some difficulty sleeping at night.  She notes that she is taking the trazodone intermittently as she did not want become dependent on it, however when she takes it it does help.  We discussed the benefits of this medication and that the concern of dependence are minimal.  Talked about continuing to cross taper off of Celexa and onto  Lexapro which patient was open to.  We also discussed continuing to taper off Xanax which she was agreeable to.  Past Psychiatric History: Patient reports that her psychiatric symptoms/depression started after 2011 when she had her stroke prior to that she reports minimal to no depressive symptoms.  She has been on Celexa for as long as she can remember post stroke and believes she was started on Xanax at the same time.  Patient denies any prior psychiatric hospitalizations or past suicide attempts.  She denies any current substance use since 2005 and reports drinking alcohol when she was younger.   Past Medical History:  Past Medical History:  Diagnosis Date   Abnormal gait    Amnesia    Anemia    hx   Aneurysm (HCC)    Anxiety    Asthma    Atrophic vaginitis    Benign hypertensive heart disease    Cardiomegaly    Chest pain    COPD (chronic obstructive pulmonary disease) ([place])     - PFTs  01/24/06 FEV1 69% ratio 68% diffusing capacity 57% with no improvement after B2   - PFT's 10/1/ 09 FEV1 77   ratio  76    dlco                   45            no resp to B2    - Nl alpha one antitripsin level 10/09   Cough    Depression    Difficulty speaking    Disorder of nasal cavity    Dyspnea    Enthesopathy of elbow region    GERD (gastroesophageal reflux disease)    Hammer toe    Headache(784.0)    Hemiparesis affecting dominant side as late effect of cerebrovascular accident ([place])    History of kidney stones    Hyperlipidemia    Hypertension    Hypothyroidism    Increased urinary frequency    Low compliance bladder    Macular degeneration of both eyes    Malaise and fatigue    Nonruptured cerebral aneurysm    Obesity    Pneumonia    hx   Pure hypercholesterolemia    Recurrent major depressive episodes ([place])    Seizure disorder, secondary ([place])    2-3 focal siezures monthly   Seizures ([place])    Short-term memory loss    since stroke   Skin sensation disturbance    Stroke  [facility]) 2011   rt sided weakness   Swelling of limb    TMJ (temporomandibular joint disorder)    Urinary tract infectious disease    Vertigo as late effect of stroke    Vitamin D deficiency     Past Surgical History:  Procedure Laterality Date   ANTERIOR CERVICAL DECOMP/DISCECTOMY FUSION N/A 09/09/2021   Procedure: Anterior  Cervical Decompression Fusion  Cervical four-five, Cervical five-six;  Surgeon: Karsten Ro, DO;  Location: [place];  Service: Neurosurgery;  Laterality: N/A;   BACK SURGERY     CARPAL TUNNEL RELEASE Left 09/09/2021  Procedure: LEFT CARPAL TUNNEL RELEASE;  Surgeon: Karsten Ro, DO;  Location: [place];  Service: Neurosurgery;  Laterality: Left;   CEREBRAL ANGIOGRAM     x3   COLONOSCOPY WITH PROPOFOL N/A 08/18/2016   Procedure: COLONOSCOPY WITH PROPOFOL;  Surgeon: Carol Ada, MD;  Location: [facility];  Service: Endoscopy;  Laterality: N/A;   LAPAROSCOPIC HYSTERECTOMY     MANDIBLE FRACTURE SURGERY     RADIOLOGY WITH ANESTHESIA N/A 08/21/2013   Procedure: RADIOLOGY WITH ANESTHESIA;  Surgeon: Rob Hickman, MD;  Location: [place];  Service: Radiology;  Laterality: N/A;   stones     no surgery    Family Psychiatric History: Denies  Family History:  Family History  Problem Relation Age of Onset   Heart disease Mother    Heart disease Father    Heart attack Father    Cancer Father        prostate to lymph node   Lung cancer Other    Breast cancer Neg Hx     Social History:  Social History   Socioeconomic History   Marital status: Married    Spouse name: Jori Moll   Number of children: 2   Years of education: 14   Highest education level: Not on file  Occupational History   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    Drug use: No   Sexual activity: Not Currently  Other Topics Concern   Not on file  Social History Narrative   Patient lives at home with her husband Jori Moll).   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    Patient is on disability.    Patient has an Associates degree.   Patient is right-handed.   Patient drinks 2 cups of green tea most days.               Social Determinants of Health   Financial Resource Strain: Low Risk  (09/22/2021)   Overall Financial Resource Strain (CARDIA)    Difficulty of Paying Living Expenses: Not hard at all  Food Insecurity: No Food Insecurity (09/22/2021)   Hunger Vital Sign    Worried About Running Out of Food in the Last Year: Never true    Ran Out of Food in the Last Year: Never true  Transportation Needs: No Transportation Needs (09/22/2021)   PRAPARE - Hydrologist (Medical): No    Lack of Transportation (Non-Medical): No  Physical Activity: Inactive (09/22/2021)   Exercise Vital Sign    Days of Exercise per Week: 0 days    Minutes of Exercise per Session: 0 min  Stress: Stress Concern Present (09/22/2021)   [place]    Feeling of Stress : To some extent  Social Connections: Not on file    Allergies:  Allergies  Allergen Reactions   Phenobarbital Hives, Shortness Of Breath and Swelling   Phenytoin Anaphylaxis, Hives and Swelling    Current Medications: Current Outpatient Medications  Medication Sig Dispense Refill   albuterol (PROVENTIL) (2.5 MG/3ML) 0.083% nebulizer solution Take 3 mLs (2.5 mg total) by nebulization every 6 (six) hours. 1080 mL 1    albuterol (VENTOLIN HFA) 108 (90 Base) MCG/ACT inhaler Inhale 2 puffs into the lungs every 6 (six) hours as needed for wheezing or shortness of breath. 24 g 3   ALPRAZolam (XANAX) 0.25 MG tablet Take 0.5 tablets (0.125 mg total) by mouth 2 (two) times daily as needed. 30 tablet 1   aspirin EC 81 MG tablet Take 81 mg by mouth daily.     Biotin 1 MG CAPS Take 1 tablet by mouth 2 (two) times daily. Viviscal     Calcium-Magnesium-Zinc (CAL-MAG-ZINC PO) Take 1 tablet by mouth daily.     Cholecalciferol (VITAMIN D-3) 1000 units CAPS Take 2,000 Units by mouth at bedtime.     citalopram (CELEXA) 10 MG tablet Take 1 tablet (10 mg total) by mouth daily. For 14 days before discontinuing citalopram 14 tablet 0   clopidogrel (PLAVIX) 75 MG tablet TAKE 1 TABLET DAILY 90 tablet 3   clopidogrel (PLAVIX) 75 MG tablet Take 1 tablet by mouth daily.     cyclobenzaprine (FLEXERIL) 5 MG tablet One tab po qpm prn 30 tablet 0   diclofenac Sodium (VOLTAREN) 1 % GEL Voltaren 1 % topical gel  APPLY 2 GRAM TO THE AFFECTED AREA(S) BY TOPICAL ROUTE 4 TIMES PER DAY 350 g 2   escitalopram (LEXAPRO) 20 MG tablet Take 1 tablet (20 mg total) by mouth at bedtime. Take Lexapro 5 mg at bedtime for 14 days before increasing to 10 mg at bedtime 90 tablet 0   fluticasone (FLONASE) 50 MCG/ACT nasal spray Place 2 sprays into both nostrils daily as needed (allergies or post-nasl drip).   3   levETIRAcetam (KEPPRA) 500 MG tablet Take 1 tablet (500 mg total) by mouth every 12 (twelve) hours. 180 tablet 3   levothyroxine (SYNTHROID) 50 MCG tablet Take one tablet by mouth on Saturday and Sunday 39 tablet 3   levothyroxine (SYNTHROID) 75 MCG tablet TAKE 1 TABLET IN THE MORNING BEFORE BREAKFAST MONDAY THROUGH FRIDAY (TAKE 50 MCG ON OTHER DAYS) 51 tablet 4   loratadine (CLARITIN) 10 MG tablet Take 1 tablet (10 mg total) by mouth daily. 30 tablet 2   Multiple Vitamins-Minerals (PRESERVISION/LUTEIN PO) Take 1 tablet by mouth daily.     NONFORMULARY  OR COMPOUNDED ITEM Apply 240 mg topically daily as needed. 1 each 3   Omega-3 Fatty Acids (FISH OIL) 1000 MG CAPS Take 2,000 mg by mouth 2 (two) times daily.      Polyethyl Glycol-Propyl Glycol (SYSTANE) 0.4-0.3 % SOLN Place 1 drop into both eyes daily as needed (dry eyes).     Psyllium (FIBER) 28.3 % POWD Take 10 mLs by mouth 2 (two) times daily.     rosuvastatin (CRESTOR) 20 MG tablet TAKE 1 TABLET DAILY (REPLACES 10 MG DOSE) 90 tablet 3   telmisartan (MICARDIS) 20 MG tablet TAKE 1 TABLET DAILY 90 tablet 3   tiZANidine (ZANAFLEX) 4 MG tablet TAKE 1 TABLET EVERY EVENING AS NEEDED 30 tablet 0   traZODone (DESYREL) 50 MG tablet Take 1 tablet (50 mg total) by mouth at bedtime as needed for sleep. 90 tablet 1   vitamin C (ASCORBIC ACID) 500 MG tablet Take 1,000  mg by mouth at bedtime. [place] hips     No current facility-administered medications for this visit.     Musculoskeletal: Strength & Muscle Tone: decreased and right hemiparesis secondary to past stroke Gait & Station: unsteady Patient leans: Left  Psychiatric Specialty Exam: Review of Systems  Blood pressure 136/77, pulse 87, temperature 97.8 F (36.6 C), temperature source Oral, height '5\' 2"'$  (1.575 m), weight 160 lb (72.6 kg).Body mass index is 29.26 kg/m.  General Appearance: Well Groomed  Eye Contact:  Good  Speech:  Clear and Coherent and Normal Rate  Volume:  Normal  Mood:  Euthymic  Affect:  Congruent  Thought Process:  Coherent and Goal Directed  Orientation:  Full (Time, Place, and Person)  Thought Content: Logical   Suicidal Thoughts:  No  Homicidal Thoughts:  No  Memory:  Immediate;   Fair  Judgement:  Good  Insight:  Good  Psychomotor Activity:  Normal  Concentration:  Concentration: Good  Recall:  [place] of Knowledge: Fair  Language: Good  Akathisia:  NA    AIMS (if indicated): not done  Assets:  Communication Skills Desire for Improvement Housing Intimacy Resilience Social  Support Transportation Vocational/Educational  ADL's:  Intact  Cognition: WNL  Sleep:  Fair   Metabolic Disorder Labs: Lab Results  Component Value Date   HGBA1C 5.6 09/16/2020   No results found for: "PROLACTIN" Lab Results  Component Value Date   CHOL 118 05/18/2022   TRIG 110 05/18/2022   HDL 63 05/18/2022   CHOLHDL 1.9 05/18/2022   VLDL 15 11/23/2009   LDLCALC 35 05/18/2022   LDLCALC 52 07/26/2021   Lab Results  Component Value Date   TSH 0.590 05/18/2022   TSH 1.830 07/26/2021    Therapeutic Level Labs: No results found for: "LITHIUM" No results found for: "VALPROATE" No results found for: "CBMZ"   Screenings: GAD-7    Flowsheet Row Office Visit from 07/04/2022 in [facility]  Total GAD-7 Score 16      Mini-Mental    [place] from 09/03/2018 in [place] Visit from 11/03/2015 in [facility] Office Visit from 05/05/2015 in [facility] Office Visit from 11/05/2014 in [facility]  Total Score (max 30 points ) '29 27 26 28      '$ PHQ2-9    [place] Visit from 07/04/2022 in [facility] Office Visit from 05/18/2022 in [facility] Office Visit from 11/15/2021 in [place] from 09/22/2021 in [place] Visit from 07/26/2021 in [facility]  PHQ-2 Total Score '5 6 2 '$ 0 1  PHQ-9 Total Score '19 17 7 '$ -- [place] Visit from 07/04/2022 in [facility] Admission (Discharged) from 09/09/2021 in [place] [facility] from 09/02/2021 in [facility] PREADMISSION TESTING  C-SSRS RISK CATEGORY No Risk No Risk No Risk       Collaboration of Care:  Collaboration of Care: Medication Management AEB medication prescription  Patient/Guardian was advised Release of Information must be obtained prior to any record release in order to collaborate their care with an outside provider. Patient/Guardian was advised if they have not already done so to contact the registration department to sign all necessary forms in order for [place] to release information regarding their care.  Consent: Patient/Guardian gives verbal consent for treatment and assignment of benefits for services provided during this visit. Patient/Guardian expressed understanding and agreed to proceed.    Vista Mink, MD 08/03/2022, 10:15 AM

## 2022-08-03 NOTE — Patient Instructions (Addendum)
-  Thank you for attending your appointment today.   - Decrease Citalopram (celexa) to 10 mg at bedtime for 2 weeks and then discontinue -  Increase Lexapro (escitalopram) 20 mg at bedtime  - Decrease daytime Xanax (alprazolam) to 0.125 mg (1/2 tab) in the morning and decrease Xanax to 0.125 mg (1/2 tab) at bedtime - Continue trazodone 25-50 mg at bedtime as needed  Please do not make any changes to medications without first discussing with your provider. If you are experiencing a psychiatric emergency, please call 911 or present to your nearest emergency department. Additional crisis, medication management, and therapy resources are included below.  [facility]  [address] [place], [place] [address] [phone] or [phone] [facility] 24/7 FOR ANYONE [address], [place], [place] Fax: [phone] [email or web address] *Interpreters available *Accepts all insurance and uninsured for Urgent Care needs

## 2022-08-04 ENCOUNTER — Other Ambulatory Visit: Payer: Self-pay

## 2022-08-04 MED ORDER — TELMISARTAN 20 MG PO TABS: 20.0000 mg | ORAL_TABLET | Freq: Every day | ORAL | 3 refills | Status: DC

## 2022-08-04 MED ORDER — TIZANIDINE HCL 4 MG PO TABS: ORAL_TABLET | ORAL | 0 refills | Status: DC

## 2022-08-09 ENCOUNTER — Other Ambulatory Visit (HOSPITAL_COMMUNITY): Payer: Self-pay | Admitting: *Deleted

## 2022-08-09 ENCOUNTER — Ambulatory Visit
Admission: EM | Admit: 2022-08-09 | Discharge: 2022-08-09 | Disposition: A | Discharge status: Home / Self Care | Payer: Medicare Other | Attending: Physician Assistant | Admitting: Physician Assistant

## 2022-08-09 ENCOUNTER — Ambulatory Visit (INDEPENDENT_AMBULATORY_CARE_PROVIDER_SITE_OTHER): Payer: Medicare Other

## 2022-08-09 DIAGNOSIS — R059 Cough, unspecified: Secondary | ICD-10-CM

## 2022-08-09 DIAGNOSIS — F331 Major depressive disorder, recurrent, moderate: Secondary | ICD-10-CM

## 2022-08-09 DIAGNOSIS — R0602 Shortness of breath: Secondary | ICD-10-CM | POA: Diagnosis not present

## 2022-08-09 DIAGNOSIS — J441 Chronic obstructive pulmonary disease with (acute) exacerbation: Secondary | ICD-10-CM | POA: Diagnosis not present

## 2022-08-09 DIAGNOSIS — F411 Generalized anxiety disorder: Secondary | ICD-10-CM

## 2022-08-09 MED ORDER — AZITHROMYCIN 250 MG PO TABS: 250.0000 mg | ORAL_TABLET | Freq: Every day | ORAL | 0 refills | Status: DC

## 2022-08-09 MED ORDER — ESCITALOPRAM OXALATE 20 MG PO TABS: 20.0000 mg | ORAL_TABLET | Freq: Every day | ORAL | 0 refills | Status: DC

## 2022-08-09 MED ORDER — PREDNISONE 20 MG PO TABS: 40.0000 mg | ORAL_TABLET | Freq: Every day | ORAL | 0 refills | Status: AC

## 2022-08-09 NOTE — ED Provider Notes (Signed)
[facility]    CSN: [id number] Arrival date & time: 08/09/22  1128      History   Chief Complaint Chief Complaint  Patient presents with   URI   Shortness of Breath   Wheezing    HPI [name] is a 66 y.o. female.   Here today for evaluation of productive cough, congestion, chest discomfort that radiates to back with some shortness of breath she has had for 2 weeks.  She has not had fever.  She denies any vomiting or diarrhea.  She has tried taking her albuterol as well as multiple over-the-counter medications without significant improvement.  The history is provided by the patient.  URI Presenting symptoms: congestion and cough   Presenting symptoms: no ear pain, no fever and no sore throat   Associated symptoms: wheezing   Shortness of Breath Associated symptoms: cough and wheezing   Associated symptoms: no abdominal pain, no ear pain, no fever, no sore throat and no vomiting   Wheezing Associated symptoms: cough and shortness of breath   Associated symptoms: no ear pain, no fever and no sore throat     Past Medical History:  Diagnosis Date   Abnormal gait    Amnesia    Anemia    hx   Aneurysm (HCC)    Anxiety    Asthma    Atrophic vaginitis    Benign hypertensive heart disease    Cardiomegaly    Chest pain    COPD (chronic obstructive pulmonary disease) (HCC)     - PFTs  01/24/06 FEV1 69% ratio 68% diffusing capacity 57% with no improvement after B2   - PFT's 10/1/ 09 FEV1 77   ratio  76    dlco                   45            no resp to B2    - Nl alpha one antitripsin level 10/09   Cough    Depression    Difficulty speaking    Disorder of nasal cavity    Dyspnea    Enthesopathy of elbow region    GERD (gastroesophageal reflux disease)    Hammer toe    Headache(784.0)    Hemiparesis affecting dominant side as late effect of cerebrovascular accident ([place])    History of kidney stones    Hyperlipidemia    Hypertension     Hypothyroidism    Increased urinary frequency    Low compliance bladder    Macular degeneration of both eyes    Malaise and fatigue    Nonruptured cerebral aneurysm    Obesity    Pneumonia    hx   Pure hypercholesterolemia    Recurrent major depressive episodes (HCC)    Seizure disorder, secondary (HCC)    2-3 focal siezures monthly   Seizures (HCC)    Short-term memory loss    since stroke   Skin sensation disturbance    Stroke ([place]) 2011   rt sided weakness   Swelling of limb    TMJ (temporomandibular joint disorder)    Urinary tract infectious disease    Vertigo as late effect of stroke    Vitamin D deficiency     Patient Active Problem List   Diagnosis Date Noted   MDD (major depressive disorder) 07/04/2022   GAD (generalized anxiety disorder) 07/04/2022   Cervical myelopathy ([place]) 09/09/2021   Family history of heart disease 06/14/2020  History of CVA (cerebrovascular accident) 06/14/2020   Exudative age-related macular degeneration of right eye with active choroidal neovascularization ([place]) 09/11/2019   Nocturnal hypoxemia 09/11/2019   Non-restorative sleep 06/13/2019   Benign myoclonus of drowsiness 06/13/2019   Excessive daytime sleepiness 06/13/2019   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   Hemiparesis affecting right side as late effect of cerebrovascular accident ([place]) 06/13/2019   Macular degeneration of right eye 06/11/2019   Class 1 obesity due to excess calories with serious comorbidity and body mass index (BMI) of 31.0 to 31.9 in adult 06/11/2019   Moderate episode of recurrent major depressive disorder ([place]) 06/11/2019   Spastic hemiparesis ([place]) 03/25/2019   Prediabetes 01/01/2019   Acquired hypothyroidism 01/01/2019   Hypertension 01/01/2019   Aortic atherosclerosis ([place]) 11/07/2018   History of embolic stroke without residual deficits 03/29/2018   Seizure disorder ([place]) 01/12/2017   Common migraine 01/12/2017   Aneurysm ([place])     Brain aneurysm 08/21/2013   Right foot drop 04/10/2013   Intracerebral hemorrhage ([place]) 03/19/2013   Convulsions ([place]) 03/19/2013   Headache 03/19/2013   Abnormality of gait 03/19/2013   Edema 02/23/2011   COUGH 06/10/2008   HYPERLIPIDEMIA 04/25/2008   TOBACCO ABUSE 04/25/2008   ASTHMA 04/25/2008   Chronic obstructive pulmonary disease ([place]) 04/25/2008    Past Surgical History:  Procedure Laterality Date   ANTERIOR CERVICAL DECOMP/DISCECTOMY FUSION N/A 09/09/2021   Procedure: Anterior  Cervical Decompression Fusion  Cervical four-five, Cervical five-six;  Surgeon: Karsten Ro, DO;  Location: [facility];  Service: Neurosurgery;  Laterality: N/A;   BACK SURGERY     CARPAL TUNNEL RELEASE Left 09/09/2021   Procedure: LEFT CARPAL TUNNEL RELEASE;  Surgeon: Karsten Ro, DO;  Location: [facility];  Service: Neurosurgery;  Laterality: Left;   CEREBRAL ANGIOGRAM     x3   COLONOSCOPY WITH PROPOFOL N/A 08/18/2016   Procedure: COLONOSCOPY WITH PROPOFOL;  Surgeon: Carol Ada, MD;  Location: [facility];  Service: Endoscopy;  Laterality: N/A;   LAPAROSCOPIC HYSTERECTOMY     MANDIBLE FRACTURE SURGERY     RADIOLOGY WITH ANESTHESIA N/A 08/21/2013   Procedure: RADIOLOGY WITH ANESTHESIA;  Surgeon: Rob Hickman, MD;  Location: [facility];  Service: Radiology;  Laterality: N/A;   stones     no surgery    OB History     Gravida  3   Para      Term      Preterm      AB      Living  2      SAB      IAB      Ectopic      Multiple      Live Births               Home Medications    Prior to Admission medications   Medication Sig Start Date End Date Taking? Authorizing Provider  azithromycin (ZITHROMAX) 250 MG tablet Take 1 tablet (250 mg total) by mouth daily. Take first 2 tablets together, then 1 every day until finished. 08/09/22  Yes Francene Finders, PA-C  predniSONE (DELTASONE) 20 MG tablet Take 2 tablets (40 mg total) by mouth daily with breakfast for 5 days. 08/09/22  08/14/22 Yes Francene Finders, PA-C  albuterol (PROVENTIL) (2.5 MG/3ML) 0.083% nebulizer solution Take 3 mLs (2.5 mg total) by nebulization every 6 (six) hours. 12/10/21   Olalere, Cicero Duck A, MD  albuterol (VENTOLIN HFA) 108 (90 Base) MCG/ACT inhaler Inhale 2 puffs into  the lungs every 6 (six) hours as needed for wheezing or shortness of breath. 12/10/21   Laurin Coder, MD  ALPRAZolam (XANAX) 0.25 MG tablet Take 0.5 tablets (0.125 mg total) by mouth 2 (two) times daily as needed. 08/03/22   Vista Mink, MD  aspirin EC 81 MG tablet Take 81 mg by mouth daily.    [provider]  Biotin 1 MG CAPS Take 1 tablet by mouth 2 (two) times daily. Viviscal    [provider]  Calcium-Magnesium-Zinc (CAL-MAG-ZINC PO) Take 1 tablet by mouth daily.    [provider]  Cholecalciferol (VITAMIN D-3) 1000 units CAPS Take 2,000 Units by mouth at bedtime.    [provider]  citalopram (CELEXA) 10 MG tablet Take 1 tablet (10 mg total) by mouth daily. For 14 days before discontinuing citalopram 08/03/22   Vista Mink, MD  clopidogrel (PLAVIX) 75 MG tablet TAKE 1 TABLET DAILY 10/18/21   Glendale Chard, MD  clopidogrel (PLAVIX) 75 MG tablet Take 1 tablet by mouth daily.    [provider]  cyclobenzaprine (FLEXERIL) 5 MG tablet One tab po qpm prn 06/04/22   [place]ale Chard, MD  diclofenac Sodium (VOLTAREN) 1 % GEL Voltaren 1 % topical gel  APPLY 2 GRAM TO THE AFFECTED AREA(S) BY TOPICAL ROUTE 4 TIMES PER DAY 08/03/20   [place]endale Chard, MD  escitalopram (LEXAPRO) 20 MG tablet Take 1 tablet (20 mg total) by mouth at bedtime. 08/09/22 11/07/22  Vista Mink, MD  fluticasone [facility]) 50 MCG/ACT nasal spray Place 2 sprays into both nostrils daily as needed (allergies or post-nasl drip).  10/24/17   [provider]  levETIRAcetam (KEPPRA) 500 MG tablet Take 1 tablet (500 mg total) by mouth every 12 (twelve) hours. 10/04/21   Frann Rider, NP  levothyroxine (SYNTHROID)  50 MCG tablet Take one tablet by mouth on Saturday and Sunday 06/15/22   Glendale Chard, MD  levothyroxine (SYNTHROID) 75 MCG tablet TAKE 1 TABLET IN THE MORNING BEFORE BREAKFAST MONDAY THROUGH FRIDAY (TAKE 50 MCG ON OTHER DAYS) 06/15/22   Glendale Chard, MD  loratadine (CLARITIN) 10 MG tablet Take 1 tablet (10 mg total) by mouth daily. 05/18/22 05/18/23  Glendale Chard, MD  Multiple Vitamins-Minerals (PRESERVISION/LUTEIN PO) Take 1 tablet by mouth daily.    [provider]  NONFORMULARY OR COMPOUNDED ITEM Apply 240 mg topically daily as needed. 03/26/20   Frann Rider, NP  Omega-3 Fatty Acids (FISH OIL) 1000 MG CAPS Take 2,000 mg by mouth 2 (two) times daily.     [provider]  Polyethyl Glycol-Propyl Glycol (SYSTANE) 0.4-0.3 % SOLN Place 1 drop into both eyes daily as needed (dry eyes).    [provider]  Psyllium (FIBER) 28.3 % POWD Take 10 mLs by mouth 2 (two) times daily.    [provider]  rosuvastatin (CRESTOR) 20 MG tablet TAKE 1 TABLET DAILY (REPLACES 10 MG DOSE) 02/07/22   Buford Dresser, MD  telmisartan (MICARDIS) 20 MG tablet Take 1 tablet (20 mg total) by mouth daily. 08/04/22   Glendale Chard, MD  tiZANidine (ZANAFLEX) 4 MG tablet TAKE 1 TABLET EVERY EVENING AS NEEDED 08/04/22   Glendale Chard, MD  traZODone (DESYREL) 50 MG tablet Take 1 tablet (50 mg total) by mouth at bedtime as needed for sleep. 08/03/22   Vista Mink, MD  vitamin C (ASCORBIC ACID) 500 MG tablet Take 1,000 mg by mouth at bedtime. Rose hips    [provider]  Family History Family History  Problem Relation Age of Onset   Heart disease Mother    Heart disease Father    Heart attack Father    Cancer Father        prostate to lymph node   Lung cancer Other    Breast cancer Neg Hx     Social History Social History   Tobacco Use   Smoking status: Former    Packs/day: 2.00    Years: 30.00    Total pack years: 60.00    Types: Cigarettes    Quit  date: 11/27/2009    Years since quitting: 12.7   Smokeless tobacco: Never   Tobacco comments:    smoked 2.5 ppd x 5 years, 1ppd x 20+ years  Vaping Use   Vaping Use: Never used  Substance Use Topics   Alcohol use: No    Alcohol/week: 0.0 standard drinks of alcohol    Comment: patient quit alcohol use 2005.    Drug use: No     Allergies   Phenobarbital and Phenytoin   Review of Systems Review of Systems  Constitutional:  Negative for chills and fever.  HENT:  Positive for congestion. Negative for ear pain and sore throat.   Eyes:  Negative for discharge and redness.  Respiratory:  Positive for cough, shortness of breath and wheezing.   Gastrointestinal:  Negative for abdominal pain, diarrhea, nausea and vomiting.     Physical Exam Triage Vital Signs ED Triage Vitals  Enc Vitals Group     BP 08/09/22 1353 127/84     Pulse Rate 08/09/22 1353 100     Resp 08/09/22 1353 20     Temp 08/09/22 1353 98.1 F (36.7 C)     Temp Source 08/09/22 1353 Oral     SpO2 08/09/22 1353 94 %     Weight --      Height --      Head Circumference --      Peak Flow --      Pain Score 08/09/22 1352 5     Pain Loc --      Pain Edu? --      Excl. in [place]? --    No data found.  Updated Vital Signs BP 127/84 (BP Location: Left Arm)   Pulse 100   Temp 98.1 F (36.7 C) (Oral)   Resp 20   SpO2 94%      Physical Exam Vitals and nursing note reviewed.  Constitutional:      General: She is not in acute distress.    Appearance: Normal appearance. She is not ill-appearing.  HENT:     Head: Normocephalic and atraumatic.     Right Ear: Tympanic membrane normal.     Left Ear: Tympanic membrane normal.     Nose: Congestion present.     Mouth/Throat:     Mouth: Mucous membranes are moist.     Pharynx: No oropharyngeal exudate or posterior oropharyngeal erythema.  Eyes:     Conjunctiva/sclera: Conjunctivae normal.  Cardiovascular:     Rate and Rhythm: Normal rate and regular rhythm.      Heart sounds: Normal heart sounds. No murmur heard. Pulmonary:     Effort: Pulmonary effort is normal. No respiratory distress.     Breath sounds: Wheezing and rhonchi present. No rales.     Comments: Mild diffuse wheezing and rhonchi noted Skin:    General: Skin is warm and dry.  Neurological:     Mental Status: She  is alert.  Psychiatric:        Mood and Affect: Mood normal.        Thought Content: Thought content normal.      UC Treatments / Results  Labs (all labs ordered are listed, but only abnormal results are displayed) Labs Reviewed - No data to display  EKG   Radiology DG Chest 2 View  Result Date: 08/09/2022 CLINICAL DATA:  Cough, shortness of breath EXAM: CHEST - 2 VIEW COMPARISON:  01/02/2019 FINDINGS: Cardiac size is within normal limits. There are no signs of pulmonary edema or focal pulmonary consolidation. There is peribronchial thickening. There is no pleural effusion or pneumothorax. There is surgical fusion in lower cervical spine. Scattered arterial calcifications are seen. IMPRESSION: There are no signs of pulmonary edema or focal pulmonary consolidation. Peribronchial thickening suggests bronchitis. Electronically Signed   By: Elmer Picker M.D.   On: 08/09/2022 14:12    Procedures Procedures (including critical care time)  Medications Ordered in UC Medications - No data to display  Initial Impression / Assessment and Plan / UC Course  I have reviewed the triage vital signs and the nursing notes.  Pertinent labs & imaging results that were available during my care of the patient were reviewed by me and considered in my medical decision making (see chart for details).    Chest x-ray clear with findings consistent with bronchitis.  Suspect likely COPD exacerbation and will treat with steroid burst as well as Z-Pak.  Encouraged follow-up if symptoms fail to improve or worsen in any way.  Patient expresses understanding.  Final Clinical  Impressions(s) / UC Diagnoses   Final diagnoses:  COPD exacerbation [facility])   Discharge Instructions   None    ED Prescriptions     Medication Sig Dispense Auth. Provider   predniSONE (DELTASONE) 20 MG tablet Take 2 tablets (40 mg total) by mouth daily with breakfast for 5 days. 10 tablet Ewell Poe F, PA-C   azithromycin (ZITHROMAX) 250 MG tablet Take 1 tablet (250 mg total) by mouth daily. Take first 2 tablets together, then 1 every day until finished. 6 tablet Francene Finders, PA-C      PDMP not reviewed this encounter.   Francene Finders, PA-C 08/09/22 [phone]

## 2022-08-09 NOTE — ED Triage Notes (Signed)
Pt presents with productive cough with colored mucous, congestion, chest discomfort that radiates through to back with some shortness of breath X 2 weeks.

## 2022-08-11 ENCOUNTER — Other Ambulatory Visit: Payer: Self-pay | Admitting: Internal Medicine

## 2022-08-13 ENCOUNTER — Other Ambulatory Visit (HOSPITAL_COMMUNITY): Payer: Self-pay | Admitting: Psychiatry

## 2022-08-13 DIAGNOSIS — F411 Generalized anxiety disorder: Secondary | ICD-10-CM

## 2022-08-13 DIAGNOSIS — F331 Major depressive disorder, recurrent, moderate: Secondary | ICD-10-CM

## 2022-08-18 ENCOUNTER — Other Ambulatory Visit: Payer: Self-pay | Admitting: Internal Medicine

## 2022-08-22 ENCOUNTER — Other Ambulatory Visit: Payer: Self-pay

## 2022-08-22 MED ORDER — TIZANIDINE HCL 4 MG PO TABS: ORAL_TABLET | ORAL | 0 refills | Status: DC

## 2022-08-22 MED ORDER — TIZANIDINE HCL 4 MG PO TABS: ORAL_TABLET | ORAL | 1 refills | Status: DC

## 2022-09-06 ENCOUNTER — Other Ambulatory Visit: Payer: Self-pay | Admitting: Internal Medicine

## 2022-09-06 DIAGNOSIS — H35033 Hypertensive retinopathy, bilateral: Secondary | ICD-10-CM | POA: Diagnosis not present

## 2022-09-06 DIAGNOSIS — H43821 Vitreomacular adhesion, right eye: Secondary | ICD-10-CM | POA: Diagnosis not present

## 2022-09-06 DIAGNOSIS — Z1231 Encounter for screening mammogram for malignant neoplasm of breast: Secondary | ICD-10-CM

## 2022-09-06 DIAGNOSIS — H353211 Exudative age-related macular degeneration, right eye, with active choroidal neovascularization: Secondary | ICD-10-CM | POA: Diagnosis not present

## 2022-09-06 DIAGNOSIS — H43812 Vitreous degeneration, left eye: Secondary | ICD-10-CM | POA: Diagnosis not present

## 2022-09-06 DIAGNOSIS — H353122 Nonexudative age-related macular degeneration, left eye, intermediate dry stage: Secondary | ICD-10-CM | POA: Diagnosis not present

## 2022-09-28 ENCOUNTER — Ambulatory Visit (INDEPENDENT_AMBULATORY_CARE_PROVIDER_SITE_OTHER): Payer: Medicare Other

## 2022-09-28 VITALS — BP 124/62 | HR 78 | Temp 98.0°F | Ht 64.0 in | Wt 159.2 lb

## 2022-09-28 DIAGNOSIS — Z0000 Encounter for general adult medical examination without abnormal findings: Secondary | ICD-10-CM

## 2022-09-28 DIAGNOSIS — E039 Hypothyroidism, unspecified: Secondary | ICD-10-CM

## 2022-09-28 DIAGNOSIS — I10 Essential (primary) hypertension: Secondary | ICD-10-CM | POA: Diagnosis not present

## 2022-09-28 NOTE — Progress Notes (Signed)
Subjective:   [name] is a 67 y.o. female who presents for Medicare Annual (Subsequent) preventive examination.  Review of Systems     Cardiac Risk Factors include: advanced age (>16mn, >>60women);dyslipidemia;hypertension     Objective:    Today's Vitals   09/28/22 1429 09/28/22 1442  BP: 124/62   Pulse: 78   Temp: 98 F (36.7 C)   TempSrc: Oral   SpO2: 96%   Weight: 159 lb 3.2 oz (72.2 kg)   Height: '5\' 4"'$  (1.626 m)   PainSc:  6    Body mass index is 27.33 kg/m.     09/28/2022    2:48 PM 09/22/2021    2:08 PM 09/02/2021    9:55 AM 04/08/2021   10:08 AM 10/16/2020   11:04 AM 09/16/2020    9:25 AM 09/11/2019    2:52 PM  Advanced Directives  Does Patient Have a Medical Advance Directive? Yes Yes Yes Yes Yes Yes Yes  Type of AParamedicof AClifton GardensLiving will HZimm[place]Living will HKensingtonLiving will HAquascoLiving will HBrownsvilleLiving will HGreensvilleLiving will HOcracokeLiving will  Does patient want to make changes to medical advance directive?    No - Patient declined     Copy of [place]Mineral Springsin Chart? No - copy requested No - copy requested No - copy requested  No - copy requested No - copy requested No - copy requested    Current Medications (verified) Outpatient Encounter Medications as of 09/28/2022  Medication Sig   albuterol (PROVENTIL) (2.5 MG/3ML) 0.083% nebulizer solution Take 3 mLs (2.5 mg total) by nebulization every 6 (six) hours.   albuterol (VENTOLIN HFA) 108 (90 Base) MCG/ACT inhaler Inhale 2 puffs into the lungs every 6 (six) hours as needed for wheezing or shortness of breath.   ALPRAZolam (XANAX) 0.25 MG tablet Take 0.5 tablets (0.125 mg total) by mouth 2 (two) times daily as needed.   aspirin EC 81 MG tablet Take 81 mg by mouth daily.   Calcium-Magnesium-Zinc (CAL-MAG-ZINC PO) Take 1  tablet by mouth daily.   Cholecalciferol (VITAMIN D-3) 1000 units CAPS Take 2,000 Units by mouth at bedtime.   clopidogrel (PLAVIX) 75 MG tablet TAKE 1 TABLET DAILY   clopidogrel (PLAVIX) 75 MG tablet Take 1 tablet by mouth daily.   cyclobenzaprine (FLEXERIL) 5 MG tablet One tab po qpm prn   diclofenac Sodium (VOLTAREN) 1 % GEL Voltaren 1 % topical gel  APPLY 2 GRAM TO THE AFFECTED AREA(S) BY TOPICAL ROUTE 4 TIMES PER DAY   escitalopram (LEXAPRO) 20 MG tablet Take 1 tablet (20 mg total) by mouth at bedtime.   levETIRAcetam (KEPPRA) 500 MG tablet Take 1 tablet (500 mg total) by mouth every 12 (twelve) hours.   levothyroxine (SYNTHROID) 50 MCG tablet Take one tablet by mouth on Saturday and Sunday   levothyroxine (SYNTHROID) 75 MCG tablet TAKE 1 TABLET IN THE MORNING BEFORE BREAKFAST MONDAY THROUGH FRIDAY (TAKE 50 MCG ON OTHER DAYS)   Multiple Vitamins-Minerals (PRESERVISION/LUTEIN PO) Take 1 tablet by mouth daily.   Omega-3 Fatty Acids (FISH OIL) 1000 MG CAPS Take 2,000 mg by mouth 2 (two) times daily.    Polyethyl Glycol-Propyl Glycol (SYSTANE) 0.4-0.3 % SOLN Place 1 drop into both eyes daily as needed (dry eyes).   Psyllium (FIBER) 28.3 % POWD Take 10 mLs by mouth 2 (two) times daily.   rosuvastatin (CRESTOR) 20  MG tablet TAKE 1 TABLET DAILY (REPLACES 10 MG DOSE)   telmisartan (MICARDIS) 20 MG tablet Take 1 tablet (20 mg total) by mouth daily.   tiZANidine (ZANAFLEX) 4 MG tablet TAKE 1 TABLET EVERY EVENING AS NEEDED   traZODone (DESYREL) 50 MG tablet Take 1 tablet (50 mg total) by mouth at bedtime as needed for sleep.   vitamin C (ASCORBIC ACID) 500 MG tablet Take 1,000 mg by mouth at bedtime. Rose hips   azithromycin (ZITHROMAX) 250 MG tablet Take 1 tablet (250 mg total) by mouth daily. Take first 2 tablets together, then 1 every day until finished. (Patient not taking: Reported on 09/28/2022)   Biotin 1 MG CAPS Take 1 tablet by mouth 2 (two) times daily. Viviscal (Patient not taking: Reported  on 09/28/2022)   fluticasone (FLONASE) 50 MCG/ACT nasal spray Place 2 sprays into both nostrils daily as needed (allergies or post-nasl drip).  (Patient not taking: Reported on 09/28/2022)   loratadine (CLARITIN) 10 MG tablet Take 1 tablet (10 mg total) by mouth daily. (Patient not taking: Reported on 09/28/2022)   NONFORMULARY OR COMPOUNDED ITEM Apply 240 mg topically daily as needed.   No facility-administered encounter medications on file as of 09/28/2022.    Allergies (verified) Phenobarbital and Phenytoin   History: Past Medical History:  Diagnosis Date   Abnormal gait    Amnesia    Anemia    hx   Aneurysm (HCC)    Anxiety    Asthma    Atrophic vaginitis    Benign hypertensive heart disease    Cardiomegaly    Chest pain    COPD (chronic obstructive pulmonary disease) (HCC)     - PFTs  01/24/06 FEV1 69% ratio 68% diffusing capacity 57% with no improvement after B2   - PFT's 10/1/ 09 FEV1 77   ratio  76    dlco                   45            no resp to B2    - Nl alpha one antitripsin level 10/09   Cough    Depression    Difficulty speaking    Disorder of nasal cavity    Dyspnea    Enthesopathy of elbow region    GERD (gastroesophageal reflux disease)    Hammer toe    Headache(784.0)    Hemiparesis affecting dominant side as late effect of cerebrovascular accident ([place])    History of kidney stones    Hyperlipidemia    Hypertension    Hypothyroidism    Increased urinary frequency    Low compliance bladder    Macular degeneration of both eyes    Malaise and fatigue    Nonruptured cerebral aneurysm    Obesity    Pneumonia    hx   Pure hypercholesterolemia    Recurrent major depressive episodes ([place])    Seizure disorder, secondary ([place])    2-3 focal siezures monthly   Seizures ([place])    Short-term memory loss    since stroke   Skin sensation disturbance    Stroke [facility]) 2011   rt sided weakness   Swelling of limb    TMJ (temporomandibular joint disorder)     Urinary tract infectious disease    Vertigo as late effect of stroke    Vitamin D deficiency    Past Surgical History:  Procedure Laterality Date   ANTERIOR CERVICAL DECOMP/DISCECTOMY FUSION N/A 09/09/2021  Procedure: Anterior  Cervical Decompression Fusion  Cervical four-five, Cervical five-six;  Surgeon: Karsten Ro, DO;  Location: [facility];  Service: Neurosurgery;  Laterality: N/A;   BACK SURGERY     CARPAL TUNNEL RELEASE Left 09/09/2021   Procedure: LEFT CARPAL TUNNEL RELEASE;  Surgeon: Karsten Ro, DO;  Location: [facility];  Service: Neurosurgery;  Laterality: Left;   CEREBRAL ANGIOGRAM     x3   COLONOSCOPY WITH PROPOFOL N/A 08/18/2016   Procedure: COLONOSCOPY WITH PROPOFOL;  Surgeon: Carol Ada, MD;  Location: [facility];  Service: Endoscopy;  Laterality: N/A;   LAPAROSCOPIC HYSTERECTOMY     MANDIBLE FRACTURE SURGERY     RADIOLOGY WITH ANESTHESIA N/A 08/21/2013   Procedure: RADIOLOGY WITH ANESTHESIA;  Surgeon: Rob Hickman, MD;  Location: [facility];  Service: Radiology;  Laterality: N/A;   stones     no surgery   Family History  Problem Relation Age of Onset   Heart disease Mother    Heart disease Father    Heart attack Father    Cancer Father        prostate to lymph node   Lung cancer Other    Breast cancer Neg Hx    Social History   Socioeconomic History   Marital status: Married    Spouse name: Jori Moll   Number of children: 2   Years of education: 14   Highest education level: Not on file  Occupational History   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    Drug use: No   Sexual activity: Not Currently  Other Topics Concern   Not on file  Social History Narrative   Patient lives at home with her husband Jori Moll).   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    Patient is on disability.    Patient has an Associates degree.   Patient is right-handed.   Patient drinks 2 cups of green tea most days.               Social Determinants of Health   Financial Resource Strain: Low Risk  (09/28/2022)   Overall Financial Resource Strain (CARDIA)    Difficulty of Paying Living Expenses: Not hard at all  Food Insecurity: No Food Insecurity (09/28/2022)   Hunger Vital Sign    Worried About Running Out of Food in the Last Year: Never true    Ran Out of Food in the Last Year: Never true  Transportation Needs: No Transportation Needs (09/28/2022)   PRAPARE - Hydrologist (Medical): No    Lack of Transportation (Non-Medical): No  Physical Activity: Inactive (09/28/2022)   Exercise Vital Sign    Days of Exercise per Week: 0 days    Minutes of Exercise per Session: 0 min  Stress: Stress Concern Present (09/28/2022)  [place] Questionnaire    Feeling of Stress : To some extent  Social Connections: Not on file    Tobacco Counseling Counseling given: Not Answered Tobacco comments: smoked 2.5 ppd x 5 years, 1ppd x 20+ years   Clinical Intake:  Pre-visit preparation completed: Yes  Pain : 0-10 Pain Score: 6  Pain Type: Chronic pain Pain Location: Back Pain Descriptors / Indicators: Aching Pain Onset: More than a month ago Pain Frequency: Constant     Nutritional Status: BMI 25 -29 Overweight Nutritional Risks: None Diabetes: No  How often do you need to have someone help you when you read instructions, pamphlets, or other written materials from  your doctor or pharmacy?: 1 - Never  Diabetic? no  Interpreter Needed?: No  Information entered by :: NAllen LPN   Activities of Daily Living    09/28/2022    2:50 PM  In your present state of health, do you have any difficulty performing the following activities:  Hearing? 0  Vision? 0  Difficulty concentrating or making decisions? 1  Walking or climbing stairs? 1  Dressing or bathing? 0  Comment needs supervision  Doing errands, shopping? 0  Preparing Food and eating ? N  Using the Toilet? N  In the past six months, have you accidently leaked urine? Y  Do you have problems with loss of bowel control? N  Managing your Medications? N  Managing your Finances? N  Housekeeping or managing your Housekeeping? Y    Patient Care Team: Glendale Chard, MD as PCP - General (Internal Medicine) Buford Dresser, MD as PCP - Cardiology (Cardiology)  Indicate any recent Medical Services you may have received from other than [facility] providers in the past year (date may be approximate).     Assessment:   This is a routine wellness examination for Ersilia.  Hearing/Vision screen Vision Screening - Comments:: Regular eye exams, Dr. Posey Pronto, Dr. Anne Fu  Dietary issues and exercise activities discussed: Current Exercise Habits: The patient does not participate in regular exercise at present   Goals Addressed             This Visit's Progress    Patient Stated       09/28/2022, get back in the gym       Depression Screen    09/28/2022    2:49 PM 07/04/2022    1:53 PM 05/18/2022    8:45 AM 11/15/2021    9:01 AM 09/22/2021    2:11 PM 07/26/2021    8:57 AM 03/16/2021   10:16 AM  PHQ 2/9 Scores  PHQ - 2 Score 0  6 2 0 1 3  PHQ- 9 Score   '17 7  13 9     '$ Information is confidential and restricted. Go to Review Flowsheets to unlock data.    Fall Risk    09/28/2022    2:49 PM 05/18/2022    8:44 AM 09/22/2021    2:09 PM 09/16/2020    9:26 AM 07/28/2020    9:55 AM  Fall Risk    Falls in the past year? 0 '1 1 1 1  '$ Comment   lost balance and eye flutters loses balance   Number falls in past yr: 0 '1 1 1 1  '$ Injury with Fall? 0 0 '1 1 1  '$ Comment    black eye, swollen hand head injury  Risk for fall due to : Medication side effect;Impaired mobility;Impaired balance/gait Impaired balance/gait Impaired balance/gait;Impaired mobility;History of  fall(s);Medication side effect History of fall(s);Impaired balance/gait;Impaired mobility;Medication side effect   Follow up Falls prevention discussed;Education provided;Falls evaluation completed Falls evaluation completed Falls evaluation completed;Education provided;Falls prevention discussed Falls evaluation completed;Education provided;Falls prevention discussed     FALL RISK PREVENTION PERTAINING TO THE HOME:  Any stairs in or around the home? Yes  If so, are there any without handrails? No  Home free of loose throw rugs in walkways, pet beds, electrical cords, etc? Yes  Adequate lighting in your home to reduce risk of falls? Yes   ASSISTIVE DEVICES UTILIZED TO PREVENT FALLS:  Life alert? No  Use of a cane, walker or w/c? Yes  Grab bars in the bathroom? No  Shower chair or bench in shower? Yes  Elevated toilet seat or a handicapped toilet? Yes   TIMED UP AND GO:  Was the test performed? Yes .  Length of time to ambulate 10 feet: 7 sec.   Gait slow and steady with assistive device  Cognitive Function:    09/03/2018    2:39 PM 11/03/2015   11:00 AM 05/05/2015   10:47 AM 11/05/2014    9:45 AM  MMSE - Mini Mental State Exam  Orientation to time '4 4 5 4  '$ Orientation to Place '5 4 4 5  '$ Registration '3 3 3 3  '$ Attention/ Calculation '5 5 4 5  '$ Recall '3 2 1 2  '$ Language- name 2 objects '2 2 2 2  '$ Language- repeat '1 1 1 1  '$ Language- follow 3 step command '3 3 3 3  '$ Language- read & follow direction '1 1 1 1  '$ Write a sentence '1 1 1 1  '$ Copy design '1 1 1 1  '$ Total score '29 27 26 28        '$ 09/28/2022    2:51 PM 09/22/2021     2:14 PM 09/16/2020    9:30 AM 09/11/2019    2:57 PM  6CIT Screen  What Year? 0 points 0 points 0 points 0 points  What month? 0 points 0 points 0 points 0 points  What time? 0 points 0 points 0 points 0 points  Count back from 20 0 points 0 points 0 points 0 points  Months in reverse 0 points 0 points 0 points 0 points  Repeat phrase 4 points 2 points 4 points 0 points  Total Score 4 points 2 points 4 points 0 points    Immunizations Immunization History  Administered Date(s) Administered   Fluad Quad(high Dose 65+) 05/18/2022   Influenza Whole 05/24/2008   Influenza,inj,Quad PF,6+ Mos 06/22/2017, 07/23/2018, 05/20/2019   Influenza-Unspecified 06/20/2018, 06/17/2020   PFIZER(Purple Top)SARS-COV-2 Vaccination 11/22/2019, 12/13/2019, 06/17/2020, 01/11/2021   PNEUMOCOCCAL CONJUGATE-20 05/18/2022   Pfizer Covid-19 Vaccine Bivalent Booster 34yr & up 07/08/2021   Pneumococcal Polysaccharide-23 03/16/2021   Td 05/24/2008   Zoster Recombinat (Shingrix) 06/22/2017, 09/01/2017    TDAP status: Due, Education has been provided regarding the importance of this vaccine. Advised may receive this vaccine at local pharmacy or Health Dept. Aware to provide a copy of the vaccination record if obtained from local pharmacy or Health Dept. Verbalized acceptance and understanding.  Flu Vaccine status: Up to date  Pneumococcal vaccine status: Up to date  Covid-19 vaccine status: Completed vaccines  Qualifies for Shingles Vaccine? Yes   Zostavax completed Yes   Shingrix Completed?: Yes  Screening Tests Health Maintenance  Topic Date Due   DTaP/Tdap/Td (2 - Tdap) 05/24/2018   COVID-19 Vaccine (6 - 2023-24 season) 05/06/2022   HIV Screening  05/19/2023 (Originally 11/08/1970)   MAMMOGRAM  07/01/2023   Lung Cancer Screening  07/19/2023   Medicare Annual Wellness (AWV)  09/29/2023   PAP SMEAR-Modifier  07/26/2024   COLONOSCOPY (Pts 45-34yr Insurance coverage will need to be confirmed)  08/18/2026    Pneumonia Vaccine 67 Years old  Completed   INFLUENZA VACCINE  Completed   DEXA SCAN  Completed   Hepatitis C Screening  Completed   Zoster Vaccines- Shingrix  Completed   HPV VACCINES  Aged Out    Health Maintenance  Health Maintenance Due  Topic Date Due   DTaP/Tdap/Td (2 - Tdap) 05/24/2018   COVID-19 Vaccine (6 - 2023-24 season) 05/06/2022    Colorectal cancer screening: Type of screening: Colonoscopy. Completed 08/18/2016. Repeat every 10 years  Mammogram status: scheduled for 10/24/2022  Bone Density status: Completed 05/01/2018.   Lung Cancer Screening: (Low Dose CT Chest recommended if Age 66-80years, 30 pack-year currently smoking OR have quit w/in 15years.) does not qualify.   Lung Cancer Screening Referral: no  Additional Screening:  Hepatitis C Screening: does qualify; Completed 09/07/2012  Vision Screening: Recommended annual ophthalmology exams for early detection of glaucoma and other disorders of the eye. Is the patient up to date with their annual eye exam?  Yes  Who is the provider or what is the name of the office in which the patient attends annual eye exams? Dr. PPosey ProntoIf pt is not established with a provider, would they like to be referred to a provider to establish care? No .   Dental Screening: Recommended annual dental exams for proper oral hygiene  Community Resource Referral / Chronic Care Management: CRR required this visit?  No   CCM required this visit?  No      Plan:     I have personally reviewed and noted the following in the patient's chart:   Medical and social history Use of alcohol, tobacco or illicit drugs  Current medications and supplements including opioid prescriptions. Patient is not currently taking opioid prescriptions. Functional ability and status Nutritional status Physical activity Advanced directives List of other physicians Hospitalizations, surgeries, and ER visits in previous 12 months Vitals Screenings to  include cognitive, depression, and falls Referrals and appointments  In addition, I have reviewed and discussed with patient certain preventive protocols, quality metrics, and best practice recommendations. A written personalized care plan for preventive services as well as general preventive health recommendations were provided to patient.     NKellie Simmering LPN   11/61/0960  Nurse Notes: none

## 2022-09-28 NOTE — Patient Instructions (Signed)
[name] , Thank you for taking time to come for your Medicare Wellness Visit. I appreciate your ongoing commitment to your health goals. Please review the following plan we discussed and let me know if I can assist you in the future.   These are the goals we discussed:  Goals      Exercise 150 min/wk Moderate Activity     "I would like to exercise more and plan on it when we start back at the church"     Exercise 150 min/wk Moderate Activity     09/11/2019, wants to exercise at home and eat healthier     Patient Stated     09/16/2020, wants to weigh 140 pounds     Patient Stated     09/22/2021, recover from surgery     Patient Stated     09/28/2022, get back in the gym        This is a list of the screening recommended for you and due dates:  Health Maintenance  Topic Date Due   DTaP/Tdap/Td vaccine (2 - Tdap) 05/24/2018   COVID-19 Vaccine (6 - 2023-24 season) 05/06/2022   HIV Screening  05/19/2023*   Mammogram  07/01/2023   Screening for Lung Cancer  07/19/2023   Medicare Annual Wellness Visit  09/29/2023   Pap Smear  07/26/2024   Colon Cancer Screening  08/18/2026   Pneumonia Vaccine  Completed   Flu Shot  Completed   DEXA scan (bone density measurement)  Completed   Hepatitis C Screening: USPSTF Recommendation to screen - Ages 35-79 yo.  Completed   Zoster (Shingles) Vaccine  Completed   HPV Vaccine  Aged Out  *Topic was postponed. The date shown is not the original due date.    Advanced directives: Please bring a copy of your POA (Power of Attorney) and/or Living Will to your next appointment.   Conditions/risks identified: none  Next appointment: Follow up in one year for your annual wellness visit    Preventive Care 65 Years and Older, Female Preventive care refers to lifestyle choices and visits with your health care provider that can promote health and wellness. What does preventive care include? A yearly physical exam. This is also called an annual  well check. Dental exams once or twice a year. Routine eye exams. Ask your health care provider how often you should have your eyes checked. Personal lifestyle choices, including: Daily care of your teeth and gums. Regular physical activity. Eating a healthy diet. Avoiding tobacco and drug use. Limiting alcohol use. Practicing safe sex. Taking low-dose aspirin every day. Taking vitamin and mineral supplements as recommended by your health care provider. What happens during an annual well check? The services and screenings done by your health care provider during your annual well check will depend on your age, overall health, lifestyle risk factors, and family history of disease. Counseling  Your health care provider may ask you questions about your: Alcohol use. Tobacco use. Drug use. Emotional well-being. Home and relationship well-being. Sexual activity. Eating habits. History of falls. Memory and ability to understand (cognition). Work and work Statistician. Reproductive health. Screening  You may have the following tests or measurements: Height, weight, and BMI. Blood pressure. Lipid and cholesterol levels. These may be checked every 5 years, or more frequently if you are over 81 years old. Skin check. Lung cancer screening. You may have this screening every year starting at age 33 if you have a 30-pack-year history of smoking and currently smoke or  have quit within the past 15 years. Fecal occult blood test (FOBT) of the stool. You may have this test every year starting at age 58. Flexible sigmoidoscopy or colonoscopy. You may have a sigmoidoscopy every 5 years or a colonoscopy every 10 years starting at age 38. Hepatitis C blood test. Hepatitis B blood test. Sexually transmitted disease (STD) testing. Diabetes screening. This is done by checking your blood sugar (glucose) after you have not eaten for a while (fasting). You may have this done every 1-3 years. Bone density  scan. This is done to screen for osteoporosis. You may have this done starting at age 82. Mammogram. This may be done every 1-2 years. Talk to your health care provider about how often you should have regular mammograms. Talk with your health care provider about your test results, treatment options, and if necessary, the need for more tests. Vaccines  Your health care provider may recommend certain vaccines, such as: Influenza vaccine. This is recommended every year. Tetanus, diphtheria, and acellular pertussis (Tdap, Td) vaccine. You may need a Td booster every 10 years. Zoster vaccine. You may need this after age 76. Pneumococcal 13-valent conjugate (PCV13) vaccine. One dose is recommended after age 23. Pneumococcal polysaccharide (PPSV23) vaccine. One dose is recommended after age 79. Talk to your health care provider about which screenings and vaccines you need and how often you need them. This information is not intended to replace advice given to you by your health care provider. Make sure you discuss any questions you have with your health care provider. Document Released: 09/18/2015 Document Revised: 05/11/2016 Document Reviewed: 06/23/2015 Elsevier Interactive Patient Education  2017 [place] Prevention in the Home Falls can cause injuries. They can happen to people of all ages. There are many things you can do to make your home safe and to help prevent falls. What can I do on the outside of my home? Regularly fix the edges of walkways and driveways and fix any cracks. Remove anything that might make you trip as you walk through a door, such as a raised step or threshold. Trim any bushes or trees on the path to your home. Use bright outdoor lighting. Clear any walking paths of anything that might make someone trip, such as rocks or tools. Regularly check to see if handrails are loose or broken. Make sure that both sides of any steps have handrails. Any raised decks and  porches should have guardrails on the edges. Have any leaves, snow, or ice cleared regularly. Use sand or salt on walking paths during winter. Clean up any spills in your garage right away. This includes oil or grease spills. What can I do in the bathroom? Use night lights. Install grab bars by the toilet and in the tub and shower. Do not use towel bars as grab bars. Use non-skid mats or decals in the tub or shower. If you need to sit down in the shower, use a plastic, non-slip stool. Keep the floor dry. Clean up any water that spills on the floor as soon as it happens. Remove soap buildup in the tub or shower regularly. Attach bath mats securely with double-sided non-slip rug tape. Do not have throw rugs and other things on the floor that can make you trip. What can I do in the bedroom? Use night lights. Make sure that you have a light by your bed that is easy to reach. Do not use any sheets or blankets that are too big for your  bed. They should not hang down onto the floor. Have a firm chair that has side arms. You can use this for support while you get dressed. Do not have throw rugs and other things on the floor that can make you trip. What can I do in the kitchen? Clean up any spills right away. Avoid walking on wet floors. Keep items that you use a lot in easy-to-reach places. If you need to reach something above you, use a strong step stool that has a grab bar. Keep electrical cords out of the way. Do not use floor polish or wax that makes floors slippery. If you must use wax, use non-skid floor wax. Do not have throw rugs and other things on the floor that can make you trip. What can I do with my stairs? Do not leave any items on the stairs. Make sure that there are handrails on both sides of the stairs and use them. Fix handrails that are broken or loose. Make sure that handrails are as long as the stairways. Check any carpeting to make sure that it is firmly attached to the  stairs. Fix any carpet that is loose or worn. Avoid having throw rugs at the top or bottom of the stairs. If you do have throw rugs, attach them to the floor with carpet tape. Make sure that you have a light switch at the top of the stairs and the bottom of the stairs. If you do not have them, ask someone to add them for you. What else can I do to help prevent falls? Wear shoes that: Do not have high heels. Have rubber bottoms. Are comfortable and fit you well. Are closed at the toe. Do not wear sandals. If you use a stepladder: Make sure that it is fully opened. Do not climb a closed stepladder. Make sure that both sides of the stepladder are locked into place. Ask someone to hold it for you, if possible. Clearly mark and make sure that you can see: Any grab bars or handrails. First and last steps. Where the edge of each step is. Use tools that help you move around (mobility aids) if they are needed. These include: Canes. Walkers. Scooters. Crutches. Turn on the lights when you go into a dark area. Replace any light bulbs as soon as they burn out. Set up your furniture so you have a clear path. Avoid moving your furniture around. If any of your floors are uneven, fix them. If there are any pets around you, be aware of where they are. Review your medicines with your doctor. Some medicines can make you feel dizzy. This can increase your chance of falling. Ask your doctor what other things that you can do to help prevent falls. This information is not intended to replace advice given to you by your health care provider. Make sure you discuss any questions you have with your health care provider. Document Released: 06/18/2009 Document Revised: 01/28/2016 Document Reviewed: 09/26/2014 Elsevier Interactive Patient Education  2017 [employer].

## 2022-09-29 LAB — URINALYSIS, ROUTINE W REFLEX MICROSCOPIC
Bilirubin, UA: NEGATIVE
Glucose, UA: NEGATIVE
Ketones, UA: NEGATIVE
Leukocytes,UA: NEGATIVE
Nitrite, UA: NEGATIVE
Protein,UA: NEGATIVE
RBC, UA: NEGATIVE
Specific Gravity, UA: 1.013 (ref 1.005–1.030)
Urobilinogen, Ur: 0.2 mg/dL (ref 0.2–1.0)
pH, UA: 6.5 (ref 5.0–7.5)

## 2022-09-29 LAB — MICROALBUMIN / CREATININE URINE RATIO
Creatinine, Urine: 46.3 mg/dL
Microalb/Creat Ratio: <6 mg/g creat (ref 0–29)
Microalbumin, Urine: <3 ug/mL

## 2022-09-29 LAB — TSH+FREE T4
Free T4: 1.26 ng/dL (ref 0.82–1.77)
TSH: 1.5 u[IU]/mL (ref 0.450–4.500)

## 2022-10-01 ENCOUNTER — Other Ambulatory Visit: Payer: Self-pay | Admitting: Adult Health

## 2022-10-03 NOTE — Progress Notes (Unsigned)
[facility]  PATIENT: [name] DOB: [birth year]   REASON FOR VISIT: Follow-up for nontraumatic intracerebral hemorrhage, abnormality of gait, right foot drop, complex partial seizure disorder, and vascular headaches   Chief complaint: No chief complaint on file.    HISTORY OF PRESENT ILLNESS:   [name] is a 68 y.o. 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  Returns today 10/04/2022 after prior visit 10/04/2021 accompanied by her husband   Stoke: Stable without new stroke/TIA symptoms and stable residual right spastic hemiparesis and gait impairment. Completed PT/OT back in 06/2021 for prior c/o slight decline in gait and balance which was beneficial. Did experience a few falls since prior visit, unsure what caused fall - fell backwards. Specifically mentions fall on xmas eve hit her head (no headache or deficits after fall) and xmas day but did not hit head.  Unsure if wasn't being as careful and moving too quickly around holidays. Previously placed order for scooter for long distance - per pt, DME company did not have outdoor scooter in [place], she did not follow up after this. Use of cane or RW for short distance. Compliant on Plavix and Crestor without side effects. Also remains on asa s/p intracranial stent per Dr. Estanislado Pandy recommendations.  Blood pressure today 122/70.    Seizures: Remains on Keppra 500 mg twice daily without associated side effects. Reports continued "eye fluttering" symptoms lasting only a few seconds which were reported at prior visit - at times 2-3x per week and other times can go a few weeks without episode. Unsure if some confusion after but also reported feeling back  to baseline after a few seconds. Typically occurs with head movement looking toward side. Has not had any episodes since surgery (see below)   Chronic migraines associated with tension headaches: No additional migraine headaches.     Cervicalgia with LUE radiculopathy: Completed MR cervical 04/2021 which showed moderate spinal stenosis C4-C5, severe spinal stenosis and moderately severe right foraminal narrowing and moderate left foraminal narrowing with potential R C6 nerve root compression. Referred to neurosurg - s/p ADCF C4/5 and L carpal tunnel release 09/09/2021. Reports complete resolution of left hand paresthesia, continued neck pain. Current use of neck brace. Started [facility] PT/OT. Feels like gait and right foot toe curling (post stroke) has worsened since procedure. Did have f/u with neurosurgery since procedure - has f/u again 2/22.      REVIEW OF SYSTEMS: Full 14 system review of systems performed and notable only for those listed, all others are neg:  Constitutional: neg Cardiovascular: neg Ear/Nose/Throat: neg  Skin: neg Eyes: neg Respiratory: neg Gastroitestinal: neg  Hematology/Lymphatic: neg Endocrine: neg Musculoskeletal: Walking difficulty, neck pain Allergy/Immunology: neg Neurological: Weakness, spasticity, headache, seizures Psychiatric: neg Sleep : neg   ALLERGIES: Allergies  Allergen Reactions   Phenobarbital Hives, Shortness Of Breath and Swelling   Phenytoin Anaphylaxis, Hives and Swelling    HOME MEDICATIONS: Outpatient Medications Prior to Visit  Medication Sig Dispense Refill   albuterol (PROVENTIL) (2.5 MG/3ML) 0.083% nebulizer solution Take 3 mLs (2.5 mg total) by nebulization every 6 (six) hours. 1080 mL 1   albuterol (VENTOLIN HFA) 108 (90 Base) MCG/ACT inhaler Inhale 2 puffs into the lungs every 6 (six) hours as needed for wheezing or shortness of breath. 24 g 3  ALPRAZolam (XANAX) 0.25 MG tablet Take 0.5 tablets (0.125 mg total) by mouth 2 (two)  times daily as needed. 30 tablet 1   aspirin EC 81 MG tablet Take 81 mg by mouth daily.     azithromycin (ZITHROMAX) 250 MG tablet Take 1 tablet (250 mg total) by mouth daily. Take first 2 tablets together, then 1 every day until finished. (Patient not taking: Reported on 09/28/2022) 6 tablet 0   Biotin 1 MG CAPS Take 1 tablet by mouth 2 (two) times daily. Viviscal (Patient not taking: Reported on 09/28/2022)     Calcium-Magnesium-Zinc (CAL-MAG-ZINC PO) Take 1 tablet by mouth daily.     Cholecalciferol (VITAMIN D-3) 1000 units CAPS Take 2,000 Units by mouth at bedtime.     clopidogrel (PLAVIX) 75 MG tablet TAKE 1 TABLET DAILY 90 tablet 3   clopidogrel (PLAVIX) 75 MG tablet Take 1 tablet by mouth daily.     cyclobenzaprine (FLEXERIL) 5 MG tablet One tab po qpm prn 30 tablet 0   diclofenac Sodium (VOLTAREN) 1 % GEL Voltaren 1 % topical gel  APPLY 2 GRAM TO THE AFFECTED AREA(S) BY TOPICAL ROUTE 4 TIMES PER DAY 350 g 2   escitalopram (LEXAPRO) 20 MG tablet Take 1 tablet (20 mg total) by mouth at bedtime. 90 tablet 0   fluticasone (FLONASE) 50 MCG/ACT nasal spray Place 2 sprays into both nostrils daily as needed (allergies or post-nasl drip).  (Patient not taking: Reported on 09/28/2022)  3   levETIRAcetam (KEPPRA) 500 MG tablet Take 1 tablet (500 mg total) by mouth every 12 (twelve) hours. 180 tablet 3   levothyroxine (SYNTHROID) 50 MCG tablet Take one tablet by mouth on Saturday and Sunday 39 tablet 3   levothyroxine (SYNTHROID) 75 MCG tablet TAKE 1 TABLET IN THE MORNING BEFORE BREAKFAST MONDAY THROUGH FRIDAY (TAKE 50 MCG ON OTHER DAYS) 51 tablet 4   loratadine (CLARITIN) 10 MG tablet Take 1 tablet (10 mg total) by mouth daily. (Patient not taking: Reported on 09/28/2022) 30 tablet 2   Multiple Vitamins-Minerals (PRESERVISION/LUTEIN PO) Take 1 tablet by mouth daily.     NONFORMULARY OR COMPOUNDED ITEM Apply 240 mg topically daily as needed. 1 each 3   Omega-3 Fatty Acids (FISH OIL) 1000 MG CAPS Take 2,000  mg by mouth 2 (two) times daily.      Polyethyl Glycol-Propyl Glycol (SYSTANE) 0.4-0.3 % SOLN Place 1 drop into both eyes daily as needed (dry eyes).     Psyllium (FIBER) 28.3 % POWD Take 10 mLs by mouth 2 (two) times daily.     rosuvastatin (CRESTOR) 20 MG tablet TAKE 1 TABLET DAILY (REPLACES 10 MG DOSE) 90 tablet 3   telmisartan (MICARDIS) 20 MG tablet Take 1 tablet (20 mg total) by mouth daily. 90 tablet 3   tiZANidine (ZANAFLEX) 4 MG tablet TAKE 1 TABLET EVERY EVENING AS NEEDED 14 tablet 0   traZODone (DESYREL) 50 MG tablet Take 1 tablet (50 mg total) by mouth at bedtime as needed for sleep. 90 tablet 1   vitamin C (ASCORBIC ACID) 500 MG tablet Take 1,000 mg by mouth at bedtime. Rose hips     No facility-administered medications prior to visit.    PAST MEDICAL HISTORY: Past Medical History:  Diagnosis Date   Abnormal gait    Amnesia    Anemia    hx   Aneurysm (HCC)    Anxiety    Asthma    Atrophic vaginitis    Benign hypertensive heart disease  Cardiomegaly    Chest pain    COPD (chronic obstructive pulmonary disease) (HCC)     - PFTs  01/24/06 FEV1 69% ratio 68% diffusing capacity 57% with no improvement after B2   - PFT's 10/1/ 09 FEV1 77   ratio  76    dlco                   45            no resp to B2    - Nl alpha one antitripsin level 10/09   Cough    Depression    Difficulty speaking    Disorder of nasal cavity    Dyspnea    Enthesopathy of elbow region    GERD (gastroesophageal reflux disease)    Hammer toe    Headache(784.0)    Hemiparesis affecting dominant side as late effect of cerebrovascular accident [facility])    History of kidney stones    Hyperlipidemia    Hypertension    Hypothyroidism    Increased urinary frequency    Low compliance bladder    Macular degeneration of both eyes    Malaise and fatigue    Nonruptured cerebral aneurysm    Obesity    Pneumonia    hx   Pure hypercholesterolemia    Recurrent major depressive episodes ([place])    Seizure  disorder, secondary ([place])    2-3 focal siezures monthly   Seizures ([place])    Short-term memory loss    since stroke   Skin sensation disturbance    Stroke [facility]) 2011   rt sided weakness   Swelling of limb    TMJ (temporomandibular joint disorder)    Urinary tract infectious disease    Vertigo as late effect of stroke    Vitamin D deficiency     PAST SURGICAL HISTORY: Past Surgical History:  Procedure Laterality Date   ANTERIOR CERVICAL DECOMP/DISCECTOMY FUSION N/A 09/09/2021   Procedure: Anterior  Cervical Decompression Fusion  Cervical four-five, Cervical five-six;  Surgeon: Karsten Ro, DO;  Location: [place];  Service: Neurosurgery;  Laterality: N/A;   BACK SURGERY     CARPAL TUNNEL RELEASE Left 09/09/2021   Procedure: LEFT CARPAL TUNNEL RELEASE;  Surgeon: Karsten Ro, DO;  Location: [place];  Service: Neurosurgery;  Laterality: Left;   CEREBRAL ANGIOGRAM     x3   COLONOSCOPY WITH PROPOFOL N/A 08/18/2016   Procedure: COLONOSCOPY WITH PROPOFOL;  Surgeon: Carol Ada, MD;  Location: [facility];  Service: Endoscopy;  Laterality: N/A;   LAPAROSCOPIC HYSTERECTOMY     MANDIBLE FRACTURE SURGERY     RADIOLOGY WITH ANESTHESIA N/A 08/21/2013   Procedure: RADIOLOGY WITH ANESTHESIA;  Surgeon: Rob Hickman, MD;  Location: [place];  Service: Radiology;  Laterality: N/A;   stones     no surgery    FAMILY HISTORY: Family History  Problem Relation Age of Onset   Heart disease Mother    Heart disease Father    Heart attack Father    Cancer Father        prostate to lymph node   Lung cancer Other    Breast cancer Neg Hx     SOCIAL HISTORY: Social History   Socioeconomic History   Marital status: Married    Spouse name: Jori Moll   Number of children: 2   Years of education: 14   Highest education level: Not on file  Occupational History   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    Drug use: No   Sexual activity: Not Currently  Other Topics Concern   Not on file  Social History Narrative   Patient lives at home with her husband Jori Moll).   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    Patient is on disability.    Patient has an Associates degree.   Patient is right-handed.   Patient drinks 2 cups of green tea most days.               Social Determinants of Health   Financial Resource Strain: Low Risk  (09/28/2022)   Overall Financial Resource Strain (CARDIA)    Difficulty of Paying Living Expenses: Not hard at all  Food Insecurity: No Food Insecurity (09/28/2022)   Hunger Vital Sign    Worried About Running Out of Food in the Last Year: Never true    Ran Out of Food in the Last Year: Never true  Transportation Needs: No Transportation Needs (09/28/2022)   PRAPARE - Hydrologist (Medical): No    Lack of Transportation (Non-Medical): No  Physical Activity: Inactive (09/28/2022)   Exercise Vital Sign    Days of Exercise per Week: 0 days    Minutes of Exercise per Session: 0 min  Stress: Stress Concern Present (09/28/2022)   [place]    Feeling of Stress : To some extent  Social Connections: Not on file  Intimate Partner Violence: Not on file     PHYSICAL EXAM  There were no vitals filed for this visit.    There is no height or weight on file to calculate BMI.  Generalized: Well developed, pleasant middle-age Caucasian female, in no  acute distress  Head: normocephalic and atraumatic,. Oropharynx benign  Neck: Supple, no carotid bruits  Cardiac: Regular rate rhythm, no murmur  Skin no rash few bruises Musculoskeletal: cervical brace in place  Neurological examination   Mentation: Alert oriented to time, place, history taking. Follows all commands speech and language fluent  Cranial nerve II-XII: Pupils were equal round reactive to light extraocular movements were full, visual field were full on confrontational test. Right lower facial weakness (most notable when smiling). hearing intact.  Uvula tongue midline.Tongue protrusion into cheek strength was normal. Motor: Full strength left upper and lower extremity. RUE 4/5 with spasticity; RLE: 4/5 hip flexor, knee extension and flexion; 3/5 ankle dorsiflexion and plantarflexion with increased tone distally Sensory: Touch and pinprick are normal on the left, mildly decreased on the right, vibratory is decreased on the right lower extremity  Coordination: Impaired on the right normal on the left  Reflexes: Brisk reflexes on right otherwise 1+ Gait and Station: Rising up from seated position without assistance, wide based hemiplegic gait right foot drop using RW with mild imbalance and  occasionally veering towards left.  Tandem walk and heel toe not attempted      DIAGNOSTIC DATA (LABS, IMAGING, TESTING) - I reviewed patient records, labs, notes, testing and imaging myself where available.  Lab Results  Component Value Date   WBC 7.6 05/18/2022   HGB 13.7 05/18/2022   HCT 43.7 05/18/2022   MCV 93 05/18/2022   PLT 205 05/18/2022      Component Value Date/Time   NA 142 05/18/2022 0938   K 3.9 05/18/2022 0938   CL 102 05/18/2022 0938   CO2 25 05/18/2022 0938   GLUCOSE 97 05/18/2022 0938   GLUCOSE 97 09/02/2021 0943   BUN 12 05/18/2022 0938   CREATININE 0.64 05/18/2022 0938   CALCIUM 9.4 05/18/2022 0938   PROT 6.6 05/18/2022 0938   ALBUMIN 4.5 05/18/2022 0938    AST 24 05/18/2022 0938   ALT 22 05/18/2022 0938   ALKPHOS 104 05/18/2022 0938   BILITOT 0.7 05/18/2022 0938   GFRNONAA >60 09/02/2021 0943   GFRAA 107 09/16/2020 1429   Lipid Panel     Component Value Date/Time   CHOL 118 05/18/2022 0938   TRIG 110 05/18/2022 0938   HDL 63 05/18/2022 0938   CHOLHDL 1.9 05/18/2022 0938   CHOLHDL 2.8 11/23/2009 0435   VLDL 15 11/23/2009 0435   LDLCALC 35 05/18/2022 0938   LABVLDL 20 05/18/2022 0938         ASSESSMENT AND PLAN 67 y.o. year old female  has a past medical history of left frontoparietal parenchymal hemorrhage March 2011 from PRES and malignant hypertension with residual right spastic hemiparesis and gait impairment. Chronic headaches with history of migraines.  Placement of pipeline stent across Left PICA aneurysm by Dr. Estanislado Pandy on 08/21/2013. History of seizure disorder on Keppra.     1.  Left frontoparietal hemorrhage 10/2009 -Residual deficits right spastic hemiparesis and gait impairment-limited benefit with Botox and tibial nerve block  -requests power operated scooter -inability to ambulate long distance due to residual stroke deficits and needs assistance/supervision to ambulate with use of cane/RW. Frequent falls with prolonged ambulation. Advised to reach out to other DME companies to see who has scooter and she will then notify us   -Continue compounding cream: Diclofenac, baclofen, cyclobenzaprine, gabapentin and bupivacaine -Continue Plavix and Crestor for secondary stroke prevention - advised no indication for aspirin from stroke standpoint - ongoing use per Dr. Estanislado Pandy s/p intracranial stent -Discussed secondary stroke prevention measures and importance of close PCP follow-up for aggressive stroke risk factor management including HTN with BP goal <130/90 and HLD with LDL below<70   2.  Seizure disorder -reports "eye fluttering" lasting for 2-3 seconds and then will lose trian of thought but a few seconds and quickly  returned back to baseline - unsure etiology. Typically occurs with head movement to side. No additional events since surgical procedure currently in neck brace. If these should restart, advised to let us know. We can consider increasing keppra dose to see if this help eliminate these events -advised to call with any worsening of symptoms or reoccurring seizure activity -may need to consider EEG or Keppra dosage adjustment in the future -Continue Keppra 500 mg twice daily for seizure prophylaxis -refill provided    3.  Chronic headaches with migraine history -stable without any recent headaches  4.  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  Left CTS, severe -s/p C4-6 ACDF and L CTS release 09/09/2021 by Dr. Reatha Armour  -continue [facility] PT/OT  Follow-up in 1 year or call earlier if needed    CC:  Glendale Chard, MD     I spent 36 minutes of face-to-face and non-face-to-face time with patient and husband.  This included previsit chart review, lab review, study review, order entry, electronic health record documentation, patient education regarding history of hemorrhage with residual deficits, secondary stroke prevention measures and aggressive stroke risk factor management, history of seizures and ongoing use of AED, chronic headaches and neck pain s/p procedure, and answered all other questions to patient and husband satisfaction   Frann Rider, AGNP-BC  [facility] [address] [place], [place] [address]  Phone [phone] Fax [phone] Note: This document was prepared with digital dictation and possible smart phrase technology. Any transcriptional errors that result from this process are unintentional.

## 2022-10-04 ENCOUNTER — Ambulatory Visit (INDEPENDENT_AMBULATORY_CARE_PROVIDER_SITE_OTHER): Payer: Medicare Other | Admitting: Adult Health

## 2022-10-04 ENCOUNTER — Encounter: Payer: Self-pay | Admitting: Adult Health

## 2022-10-04 VITALS — BP 130/69 | HR 87 | Ht 62.0 in | Wt 161.0 lb

## 2022-10-04 DIAGNOSIS — R519 Headache, unspecified: Secondary | ICD-10-CM

## 2022-10-04 DIAGNOSIS — Z8673 Personal history of transient ischemic attack (TIA), and cerebral infarction without residual deficits: Secondary | ICD-10-CM

## 2022-10-04 DIAGNOSIS — G8929 Other chronic pain: Secondary | ICD-10-CM | POA: Diagnosis not present

## 2022-10-04 DIAGNOSIS — G40909 Epilepsy, unspecified, not intractable, without status epilepticus: Secondary | ICD-10-CM | POA: Diagnosis not present

## 2022-10-04 MED ORDER — LEVETIRACETAM 500 MG PO TABS: 500.0000 mg | ORAL_TABLET | Freq: Two times a day (BID) | ORAL | 3 refills | Status: DC

## 2022-10-04 NOTE — Patient Instructions (Addendum)
Continue clopidogrel 75 mg daily  and Crestor for secondary stroke prevention. Continue aspirin per Dr. Arlean Hopping recommendations  Would recommend you tract the eye fluttering sensation, this could be due to a type of vertigo called BPPV. Please monitor these symptoms and let me know if they should reoccur. We could try vestibular rehab if this should reoccur.   Recommend trying Nurtec '75mg'$  as needed with onset of migraine/headache - do not take more than 1 tablet in a 24 hours period. If this does not help, please let me know   Continue Keppra 500 mg twice daily for seizure prevention  Continue to follow up with PCP regarding blood pressure and cholesterol management  Maintain strict control of hypertension with blood pressure goal below 130/90 and cholesterol with LDL cholesterol (bad cholesterol) goal below 70 mg/dL.   Signs of a Stroke? Follow the BEFAST method:  Balance Watch for a sudden loss of balance, trouble with coordination or vertigo Eyes Is there a sudden loss of vision in one or both eyes? Or double vision?  Face: Ask the person to smile. Does one side of the face droop or is it numb?  Arms: Ask the person to raise both arms. Does one arm drift downward? Is there weakness or numbness of a leg? Speech: Ask the person to repeat a simple phrase. Does the speech sound slurred/strange? Is the person confused ? Time: If you observe any of these signs, call 911.    Followup in the future with me in 1 year or call earlier if needed       Thank you for coming to see [place] at [facility]. I hope we have been able to provide you high quality care today.  You may receive a patient satisfaction survey over the next few weeks. We would appreciate your feedback and comments so that we may continue to improve ourselves and the health of our patients.

## 2022-10-05 ENCOUNTER — Encounter (HOSPITAL_COMMUNITY): Payer: Self-pay | Admitting: Psychiatry

## 2022-10-05 ENCOUNTER — Ambulatory Visit (HOSPITAL_BASED_OUTPATIENT_CLINIC_OR_DEPARTMENT_OTHER): Payer: Medicare Other | Admitting: Psychiatry

## 2022-10-05 VITALS — BP 143/80 | HR 79 | Temp 98.1°F | Ht 62.0 in | Wt 160.8 lb

## 2022-10-05 DIAGNOSIS — F411 Generalized anxiety disorder: Secondary | ICD-10-CM | POA: Diagnosis not present

## 2022-10-05 DIAGNOSIS — F331 Major depressive disorder, recurrent, moderate: Secondary | ICD-10-CM

## 2022-10-05 MED ORDER — ESCITALOPRAM OXALATE 20 MG PO TABS: 20.0000 mg | ORAL_TABLET | Freq: Every day | ORAL | 0 refills | Status: DC

## 2022-10-05 NOTE — Patient Instructions (Signed)
Thank you for attending your appointment today.  - Continue Lexapro (escitalopram) 20 mg at bedtime  - Continue Xanax (alprazolam) 0.125 mg (1/2 tab) in the morning and discontinue Xanax 0.125 mg (1/2 tab) at bedtime (was for anxiety of anxiety increases call the office) - Continue trazodone 25-50 mg at bedtime as needed for insomnia - Follow up in 6 weeks  Please do not make any changes to medications without first discussing with your provider. If you are experiencing a psychiatric emergency, please call 911 or present to your nearest emergency department. Additional crisis, medication management, and therapy resources are included below.  [facility]  [address], [place], [place] [address] [phone] or [phone] [facility] 24/7 FOR ANYONE [address] [place], [place] Fax: [phone] [email or web address] *Interpreters available *Accepts all insurance and uninsured for Urgent Care needs

## 2022-10-05 NOTE — Progress Notes (Signed)
BH MD/PA/NP OP Progress Note  10/05/2022 1:35 PM [name]  MRN:  [id number]  Visit Diagnosis:    ICD-10-CM   1. GAD (generalized anxiety disorder)  F41.1 escitalopram (LEXAPRO) 20 MG tablet    2. Moderate episode of recurrent major depressive disorder (HCC)  F33.1 escitalopram (LEXAPRO) 20 MG tablet      Assessment: [name] is a 67 y.o. 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    During initial evaluation patient reported symptoms of depression including anhedonia, feelings of hopelessness, increased fatigue, decreased appetite, difficulty sleeping, difficulty concentrating, and feelings of worthlessness.  She denied any SI/HI or thoughts of self-harm along with any past history of mania or psychosis.  Patient also endorsed symptoms of anxiety including feeling nervous or on edge, being unable to control her worrying, worrying too much with different things, difficulty relaxing, and irritability.  Of note patient had a left frontoparietal parenchymal hemorrhage in March 2011 which correlated with the onset of her cognitive impairment, onset of seizures, and onset of depressive symptoms.  Patient was started on Keppra around that time and we discussed the possibility that it could be contributing to her increased irritability/agitation.    [name] presents for follow-up evaluation. Today, 10/05/22, patient reports continued improvement in her mood, anxiety, and irritability symptoms over the past couple months.  She does still have noticeable anxiety at times however feels like it is easily managed.  Of note patient has had some increase in insomnia symptoms which improves after taking trazodone.  Patient has only been taking it a few times a month and was encouraged to use it more  frequently if needed.  She denies noticing any difference from tapering the Xanax and we will continue to taper off by discontinuing the evening dose today.  Plan: -  Continue Lexapro (escitalopram) 20 mg at bedtime  - Decrease Xanax (alprazolam) to 0.125 mg (1/2 tab)  - Continue trazodone 25-50 mg at bedtime as needed - EKG reviewed QTC 462 - Discuss Keppra with neurologist - CBC, CMP, lipid profile, Vit D, and TSH reviewed - Follow up in a 6 weeks  Chief Complaint:  Chief Complaint  Patient presents with   Follow-up   Medication Refill   HPI: [name] presents reporting that things have been going pretty good for her the past couple months.  She has noticed that her mood, anxiety, and irritability have continued to improve with the increase in the Lexapro.  She can still get anxious at times but does not feel it is anything that she is unable to manage.  She denies any notable side effects other than a bit more difficulty with sleep at night.  Patient notes that she takes the trazodone a few times a month when she is having a really hard time sleeping.  We discussed the trazodone with the patient and encouraged her to use it more frequently if she is having consistent difficulty with insomnia.  We discussed the patient's Xanax again and she reports noticing no difference with the last taper to half a tab twice a day.  After going over the long-term side effects of daily benzodiazepine use patient was interested in continuing to taper off the medication and monitoring for changes in her anxiety symptoms.  We also discussed the Keppra and patient reports that she did just meet  with a neurologist and opted to stay on the medication.  While she still is unsure whether she had a seizure in 2011 she has concerns that stopping it might result in them occurring.  She also notes not being able to drive for 6 months after stopping the Keppra while feasible would make things a bit more difficult for her.  As  her irritability symptoms seem to have improved she does not see the need to change the Keppra at this time.  Past Psychiatric History: Patient reports that her psychiatric symptoms/depression started after 2011 when she had her stroke prior to that she reports minimal to no depressive symptoms.  She has been on Celexa for as long as she can remember post stroke and believes she was started on Xanax at the same time.  Patient denies any prior psychiatric hospitalizations or past suicide attempts.  She denies any current substance use since 2005 and reports drinking alcohol when she was younger.   Past Medical History:  Past Medical History:  Diagnosis Date   Abnormal gait    Amnesia    Anemia    hx   Aneurysm (HCC)    Anxiety    Asthma    Atrophic vaginitis    Benign hypertensive heart disease    Cardiomegaly    Chest pain    COPD (chronic obstructive pulmonary disease) ([place])     - PFTs  01/24/06 FEV1 69% ratio 68% diffusing capacity 57% with no improvement after B2   - PFT's 10/1/ 09 FEV1 77   ratio  76    dlco                   45            no resp to B2    - Nl alpha one antitripsin level 10/09   Cough    Depression    Difficulty speaking    Disorder of nasal cavity    Dyspnea    Enthesopathy of elbow region    GERD (gastroesophageal reflux disease)    Hammer toe    Headache(784.0)    Hemiparesis affecting dominant side as late effect of cerebrovascular accident ([place])    History of kidney stones    Hyperlipidemia    Hypertension    Hypothyroidism    Increased urinary frequency    Low compliance bladder    Macular degeneration of both eyes    Malaise and fatigue    Nonruptured cerebral aneurysm    Obesity    Pneumonia    hx   Pure hypercholesterolemia    Recurrent major depressive episodes ([place])    Seizure disorder, secondary ([place])    2-3 focal siezures monthly   Seizures ([place])    Short-term memory loss    since stroke   Skin sensation disturbance    Stroke  [facility]) 2011   rt sided weakness   Swelling of limb    TMJ (temporomandibular joint disorder)    Urinary tract infectious disease    Vertigo as late effect of stroke    Vitamin D deficiency     Past Surgical History:  Procedure Laterality Date   ANTERIOR CERVICAL DECOMP/DISCECTOMY FUSION N/A 09/09/2021   Procedure: Anterior  Cervical Decompression Fusion  Cervical four-five, Cervical five-six;  Surgeon: Karsten Ro, DO;  Location: [place];  Service: Neurosurgery;  Laterality: N/A;   BACK SURGERY     CARPAL TUNNEL RELEASE Left 09/09/2021   Procedure: LEFT CARPAL TUNNEL RELEASE;  Surgeon: Karsten Ro, DO;  Location: [place];  Service: Neurosurgery;  Laterality: Left;   CEREBRAL ANGIOGRAM     x3   COLONOSCOPY WITH PROPOFOL N/A 08/18/2016   Procedure: COLONOSCOPY WITH PROPOFOL;  Surgeon: Carol Ada, MD;  Location: [facility];  Service: Endoscopy;  Laterality: N/A;   LAPAROSCOPIC HYSTERECTOMY     MANDIBLE FRACTURE SURGERY     RADIOLOGY WITH ANESTHESIA N/A 08/21/2013   Procedure: RADIOLOGY WITH ANESTHESIA;  Surgeon: Rob Hickman, MD;  Location: [place];  Service: Radiology;  Laterality: N/A;   stones     no surgery    Family Psychiatric History: Denies  Family History:  Family History  Problem Relation Age of Onset   Heart disease Mother    Heart disease Father    Heart attack Father    Cancer Father        prostate to lymph node   Lung cancer Other    Breast cancer Neg Hx     Social History:  Social History   Socioeconomic History   Marital status: Married    Spouse name: Jori Moll   Number of children: 2   Years of education: 14   Highest education level: Not on file  Occupational History   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    Drug use: No   Sexual activity: Not Currently  Other Topics Concern   Not on file  Social History Narrative   Patient lives at home with her husband Jori Moll).   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    Patient is on disability.    Patient has an Associates degree.   Patient is right-handed.   Patient drinks 2 cups of green tea most days.               Social Determinants of Health   Financial Resource Strain: Low Risk  (09/28/2022)   Overall Financial Resource Strain (CARDIA)    Difficulty of Paying Living Expenses: Not hard at all  Food Insecurity: No Food Insecurity (09/28/2022)   Hunger Vital Sign    Worried About Running Out of Food in the Last Year: Never true    Ran Out of Food in the Last Year: Never true  Transportation Needs: No Transportation Needs (09/28/2022)   PRAPARE - Hydrologist (Medical): No    Lack of Transportation (Non-Medical): No  Physical Activity: Inactive (09/28/2022)   Exercise Vital Sign    Days of Exercise per Week: 0 days    Minutes of Exercise per Session: 0 min  Stress: Stress Concern Present (09/28/2022)   [place]    Feeling of Stress : To some extent  Social Connections: Not on file    Allergies:  Allergies  Allergen Reactions  Phenobarbital Hives, Shortness Of Breath and Swelling   Phenytoin Anaphylaxis, Hives and Swelling    Current Medications: Current Outpatient Medications  Medication Sig Dispense Refill   albuterol (PROVENTIL) (2.5 MG/3ML) 0.083% nebulizer solution Take 3 mLs (2.5 mg total) by nebulization every 6 (six) hours. 1080 mL 1    albuterol (VENTOLIN HFA) 108 (90 Base) MCG/ACT inhaler Inhale 2 puffs into the lungs every 6 (six) hours as needed for wheezing or shortness of breath. 24 g 3   ALPRAZolam (XANAX) 0.25 MG tablet Take 0.5 tablets (0.125 mg total) by mouth 2 (two) times daily as needed. 30 tablet 1   aspirin EC 81 MG tablet Take 81 mg by mouth daily.     azithromycin (ZITHROMAX) 250 MG tablet Take 1 tablet (250 mg total) by mouth daily. Take first 2 tablets together, then 1 every day until finished. 6 tablet 0   Biotin 1 MG CAPS Take 1 tablet by mouth 2 (two) times daily. Viviscal     Calcium-Magnesium-Zinc (CAL-MAG-ZINC PO) Take 1 tablet by mouth daily.     Cholecalciferol (VITAMIN D-3) 1000 units CAPS Take 2,000 Units by mouth at bedtime.     clopidogrel (PLAVIX) 75 MG tablet TAKE 1 TABLET DAILY 90 tablet 3   clopidogrel (PLAVIX) 75 MG tablet Take 1 tablet by mouth daily.     cyclobenzaprine (FLEXERIL) 5 MG tablet One tab po qpm prn 30 tablet 0   diclofenac Sodium (VOLTAREN) 1 % GEL Voltaren 1 % topical gel  APPLY 2 GRAM TO THE AFFECTED AREA(S) BY TOPICAL ROUTE 4 TIMES PER DAY 350 g 2   escitalopram (LEXAPRO) 20 MG tablet Take 1 tablet (20 mg total) by mouth at bedtime. 90 tablet 0   fluticasone (FLONASE) 50 MCG/ACT nasal spray Place 2 sprays into both nostrils daily as needed (allergies or post-nasl drip).  3   levETIRAcetam (KEPPRA) 500 MG tablet Take 1 tablet (500 mg total) by mouth every 12 (twelve) hours. 180 tablet 3   levothyroxine (SYNTHROID) 50 MCG tablet Take one tablet by mouth on Saturday and Sunday 39 tablet 3   levothyroxine (SYNTHROID) 75 MCG tablet TAKE 1 TABLET IN THE MORNING BEFORE BREAKFAST MONDAY THROUGH FRIDAY (TAKE 50 MCG ON OTHER DAYS) 51 tablet 4   loratadine (CLARITIN) 10 MG tablet Take 1 tablet (10 mg total) by mouth daily. 30 tablet 2   Multiple Vitamins-Minerals (PRESERVISION/LUTEIN PO) Take 1 tablet by mouth daily.     NONFORMULARY OR COMPOUNDED ITEM Apply 240 mg topically daily as  needed. 1 each 3   Omega-3 Fatty Acids (FISH OIL) 1000 MG CAPS Take 2,000 mg by mouth 2 (two) times daily.      Polyethyl Glycol-Propyl Glycol (SYSTANE) 0.4-0.3 % SOLN Place 1 drop into both eyes daily as needed (dry eyes).     Psyllium (FIBER) 28.3 % POWD Take 10 mLs by mouth 2 (two) times daily.     rosuvastatin (CRESTOR) 20 MG tablet TAKE 1 TABLET DAILY (REPLACES 10 MG DOSE) 90 tablet 3   telmisartan (MICARDIS) 20 MG tablet Take 1 tablet (20 mg total) by mouth daily. 90 tablet 3   tiZANidine (ZANAFLEX) 4 MG tablet TAKE 1 TABLET EVERY EVENING AS NEEDED 14 tablet 0   traZODone (DESYREL) 50 MG tablet Take 1 tablet (50 mg total) by mouth at bedtime as needed for sleep. 90 tablet 1   vitamin C (ASCORBIC ACID) 500 MG tablet Take 1,000 mg by mouth at bedtime. Rose hips     No current  facility-administered medications for this visit.     Musculoskeletal: Strength & Muscle Tone: decreased and right hemiparesis secondary to past stroke Gait & Station: unsteady Patient leans: Left  Psychiatric Specialty Exam: Review of Systems  There were no vitals taken for this visit.There is no height or weight on file to calculate BMI.  General Appearance: Well Groomed  Eye Contact:  Good  Speech:  Clear and Coherent and Normal Rate  Volume:  Normal  Mood:  Euthymic  Affect:  Congruent  Thought Process:  Coherent and Goal Directed  Orientation:  Full (Time, Place, and Person)  Thought Content: Logical   Suicidal Thoughts:  No  Homicidal Thoughts:  No  Memory:  Immediate;   Fair  Judgement:  Good  Insight:  Good  Psychomotor Activity:  Normal  Concentration:  Concentration: Good  Recall:  [place] of Knowledge: Fair  Language: Good  Akathisia:  NA    AIMS (if indicated): not done  Assets:  Communication Skills Desire for Improvement Housing Intimacy Resilience Social Support Transportation Vocational/Educational  ADL's:  Intact  Cognition: WNL  Sleep:  Fair   Metabolic Disorder  Labs: Lab Results  Component Value Date   HGBA1C 5.6 09/16/2020   No results found for: "PROLACTIN" Lab Results  Component Value Date   CHOL 118 05/18/2022   TRIG 110 05/18/2022   HDL 63 05/18/2022   CHOLHDL 1.9 05/18/2022   VLDL 15 11/23/2009   LDLCALC 35 05/18/2022   LDLCALC 52 07/26/2021   Lab Results  Component Value Date   TSH 1.500 09/28/2022   TSH 0.590 05/18/2022    Therapeutic Level Labs: No results found for: "LITHIUM" No results found for: "VALPROATE" No results found for: "CBMZ"   Screenings: GAD-7    Flowsheet Row Office Visit from 07/04/2022 in [facility]  Total GAD-7 Score 16      Mini-Mental    [place] from 09/03/2018 in [facility] Office Visit from 11/03/2015 in [facility] Office Visit from 05/05/2015 in [facility] Office Visit from 11/05/2014 in [facility]  Total Score (max 30 points ) '29 27 26 28      '$ PHQ2-9    Flowsheet Row Clinical Support from 09/28/2022 in [facility] Office Visit from 07/04/2022 in [facility] Office Visit from 05/18/2022 in [facility] Office Visit from 11/15/2021 in [place] from 09/22/2021 in [facility]  PHQ-2 Total Score 0 '5 6 2 '$ 0  PHQ-9 Total Score -- '19 17 7 '$ --      Flowsheet Row ED from 08/09/2022 in [facility] Office Visit from 07/04/2022 in [facility] Admission (Discharged) from 09/09/2021 in [place] No Risk No Risk No Risk       Collaboration of Care: Collaboration of Care: Medication  Management AEB medication prescription  Patient/Guardian was advised Release of Information must be obtained prior to any record release in order to collaborate their care with an outside provider. Patient/Guardian was advised if they have not already done so to contact the registration department to sign all necessary forms in order for [place] to release information regarding their care.   Consent: Patient/Guardian gives verbal consent for treatment  and assignment of benefits for services provided during this visit. Patient/Guardian expressed understanding and agreed to proceed.    Vista Mink, MD 10/05/2022, 1:35 PM

## 2022-10-11 DIAGNOSIS — H353122 Nonexudative age-related macular degeneration, left eye, intermediate dry stage: Secondary | ICD-10-CM | POA: Diagnosis not present

## 2022-10-11 DIAGNOSIS — H353211 Exudative age-related macular degeneration, right eye, with active choroidal neovascularization: Secondary | ICD-10-CM | POA: Diagnosis not present

## 2022-10-11 DIAGNOSIS — H43812 Vitreous degeneration, left eye: Secondary | ICD-10-CM | POA: Diagnosis not present

## 2022-10-11 DIAGNOSIS — H43821 Vitreomacular adhesion, right eye: Secondary | ICD-10-CM | POA: Diagnosis not present

## 2022-10-11 DIAGNOSIS — H35033 Hypertensive retinopathy, bilateral: Secondary | ICD-10-CM | POA: Diagnosis not present

## 2022-10-15 ENCOUNTER — Other Ambulatory Visit (HOSPITAL_COMMUNITY): Payer: Self-pay | Admitting: Psychiatry

## 2022-10-15 ENCOUNTER — Other Ambulatory Visit: Payer: Self-pay | Admitting: Internal Medicine

## 2022-10-15 DIAGNOSIS — F411 Generalized anxiety disorder: Secondary | ICD-10-CM

## 2022-10-15 DIAGNOSIS — F331 Major depressive disorder, recurrent, moderate: Secondary | ICD-10-CM

## 2022-10-24 ENCOUNTER — Ambulatory Visit
Admission: RE | Admit: 2022-10-24 | Discharge: 2022-10-24 | Disposition: A | Discharge status: Home / Self Care | Payer: Medicare Other | Source: Ambulatory Visit | Attending: Internal Medicine | Admitting: Internal Medicine

## 2022-10-24 DIAGNOSIS — Z1231 Encounter for screening mammogram for malignant neoplasm of breast: Secondary | ICD-10-CM | POA: Diagnosis not present

## 2022-11-14 ENCOUNTER — Other Ambulatory Visit: Payer: Self-pay

## 2022-11-14 ENCOUNTER — Other Ambulatory Visit: Payer: Self-pay | Admitting: Cardiology

## 2022-11-14 DIAGNOSIS — I2510 Atherosclerotic heart disease of native coronary artery without angina pectoris: Secondary | ICD-10-CM

## 2022-11-14 DIAGNOSIS — I700 Atherosclerosis of aorta: Secondary | ICD-10-CM

## 2022-11-14 DIAGNOSIS — E785 Hyperlipidemia, unspecified: Secondary | ICD-10-CM

## 2022-11-14 DIAGNOSIS — Z8673 Personal history of transient ischemic attack (TIA), and cerebral infarction without residual deficits: Secondary | ICD-10-CM

## 2022-11-14 MED ORDER — ROSUVASTATIN CALCIUM 20 MG PO TABS: ORAL_TABLET | ORAL | 3 refills | Status: DC

## 2022-11-16 ENCOUNTER — Encounter (HOSPITAL_COMMUNITY): Payer: Self-pay | Admitting: Psychiatry

## 2022-11-16 ENCOUNTER — Ambulatory Visit (HOSPITAL_BASED_OUTPATIENT_CLINIC_OR_DEPARTMENT_OTHER): Payer: Medicare Other | Admitting: Psychiatry

## 2022-11-16 ENCOUNTER — Ambulatory Visit: Payer: Medicare Other | Admitting: Internal Medicine

## 2022-11-16 ENCOUNTER — Other Ambulatory Visit: Payer: Self-pay

## 2022-11-16 DIAGNOSIS — F331 Major depressive disorder, recurrent, moderate: Secondary | ICD-10-CM | POA: Diagnosis not present

## 2022-11-16 DIAGNOSIS — I2510 Atherosclerotic heart disease of native coronary artery without angina pectoris: Secondary | ICD-10-CM

## 2022-11-16 DIAGNOSIS — E785 Hyperlipidemia, unspecified: Secondary | ICD-10-CM

## 2022-11-16 DIAGNOSIS — F411 Generalized anxiety disorder: Secondary | ICD-10-CM | POA: Diagnosis not present

## 2022-11-16 DIAGNOSIS — Z8673 Personal history of transient ischemic attack (TIA), and cerebral infarction without residual deficits: Secondary | ICD-10-CM

## 2022-11-16 DIAGNOSIS — I700 Atherosclerosis of aorta: Secondary | ICD-10-CM

## 2022-11-16 MED ORDER — ESCITALOPRAM OXALATE 20 MG PO TABS: 20.0000 mg | ORAL_TABLET | Freq: Every day | ORAL | 0 refills | Status: DC

## 2022-11-16 MED ORDER — TRAZODONE HCL 50 MG PO TABS: 75.0000 mg | ORAL_TABLET | Freq: Every evening | ORAL | 1 refills | Status: DC | PRN

## 2022-11-16 MED ORDER — ROSUVASTATIN CALCIUM 20 MG PO TABS: ORAL_TABLET | ORAL | 3 refills | Status: DC

## 2022-11-16 NOTE — Progress Notes (Signed)
BH MD/PA/NP OP Progress Note  11/16/2022 1:23 PM [name]  MRN:  [id number]  Visit Diagnosis:    ICD-10-CM   1. GAD (generalized anxiety disorder)  F41.1 escitalopram (LEXAPRO) 20 MG tablet    traZODone (DESYREL) 50 MG tablet    2. Moderate episode of recurrent major depressive disorder (HCC)  F33.1 escitalopram (LEXAPRO) 20 MG tablet    traZODone (DESYREL) 50 MG tablet      Assessment: [name] is a 67 y.o. 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    During initial evaluation patient reported symptoms of depression including anhedonia, feelings of hopelessness, increased fatigue, decreased appetite, difficulty sleeping, difficulty concentrating, and feelings of worthlessness.  She denied any SI/HI or thoughts of self-harm along with any past history of mania or psychosis.  Patient also endorsed symptoms of anxiety including feeling nervous or on edge, being unable to control her worrying, worrying too much with different things, difficulty relaxing, and irritability.  Of note patient had a left frontoparietal parenchymal hemorrhage in March 2011 which correlated with the onset of her cognitive impairment, onset of seizures, and onset of depressive symptoms.  Patient was started on Keppra around that time and we discussed the possibility that it could be contributing to her increased irritability/agitation.    [name] presents for follow-up evaluation. Today, 11/16/22, patient reports continued stability in her mood and anxiety symptoms.  She denies noting any significant change with tapering the Xanax.  At this time patient's only concern is with insomnia noting that the trazodone helps however after 4 hours she wakes up and has difficulty getting back to sleep.  We will increase  trazodone to 75 mg as needed for insomnia today.  We will also discontinue Xanax.  Plan: - Continue Lexapro (escitalopram) 20 mg at bedtime  - Discontinue Xanax (alprazolam) to 0.125 mg (1/2 tab)  - Continue trazodone 75 mg at bedtime as needed - EKG reviewed QTC 462 - Discuss Keppra with neurologist - CBC, CMP, lipid profile, Vit D, and TSH reviewed - Follow up in a 6 weeks  Chief Complaint:  Chief Complaint  Patient presents with   Follow-up   HPI: [name] presents reporting that things have been going pretty well for her over the past month.  She notes that things have been about the same and there is been no notable difference from tapering the Xanax to half a tab a day.  She was that her anxiety and stress levels are still well controlled.  As for sleep patient has been taking the 50 mg dose as needed and finds that she sleeps fairly well when she takes it.  When she does not take it she can have her mind race and has difficulty shutting down to go to sleep.  We discussed the possibility of taking it every day if needed and went over the limited long-term negative effects of the medication.  We also discussed increasing it as patient reports that when she does take it she will sleep around 4 hours before waking up to go to the bathroom and then being unable to fall back asleep.  We will increase to 75 mg today and can consider 100 in the future if patient notes partial improvement on the 75 mg dose.  Also discussed discontinuing Xanax today which patient was in agreement with.  Past Psychiatric History: Patient reports that her psychiatric symptoms/depression started after 2011 when she had her stroke prior to that she reports minimal to no depressive symptoms.  She has been on Celexa for as long as she can remember post stroke and believes she was started on Xanax at the same time.  Patient denies any prior psychiatric hospitalizations or past suicide attempts.  She denies any current  substance use since 2005 and reports drinking alcohol when she was younger.   Past Medical History:  Past Medical History:  Diagnosis Date   Abnormal gait    Amnesia    Anemia    hx   Aneurysm (HCC)    Anxiety    Asthma    Atrophic vaginitis    Benign hypertensive heart disease    Cardiomegaly    Chest pain    COPD (chronic obstructive pulmonary disease) ([place])     - PFTs  01/24/06 FEV1 69% ratio 68% diffusing capacity 57% with no improvement after B2   - PFT's 10/1/ 09 FEV1 77   ratio  76    dlco                   45            no resp to B2    - Nl alpha one antitripsin level 10/09   Cough    Depression    Difficulty speaking    Disorder of nasal cavity    Dyspnea    Enthesopathy of elbow region    GERD (gastroesophageal reflux disease)    Hammer toe    Headache(784.0)    Hemiparesis affecting dominant side as late effect of cerebrovascular accident ([place])    History of kidney stones    Hyperlipidemia    Hypertension    Hypothyroidism    Increased urinary frequency    Low compliance bladder    Macular degeneration of both eyes    Malaise and fatigue    Nonruptured cerebral aneurysm    Obesity    Pneumonia    hx   Pure hypercholesterolemia    Recurrent major depressive episodes ([place])    Seizure disorder, secondary ([place])    2-3 focal siezures monthly   Seizures ([place])    Short-term memory loss    since stroke   Skin sensation disturbance    Stroke [facility]) 2011   rt sided weakness   Swelling of limb    TMJ (temporomandibular joint disorder)    Urinary tract infectious disease    Vertigo as late effect of stroke    Vitamin D deficiency     Past Surgical History:  Procedure Laterality Date   ANTERIOR CERVICAL DECOMP/DISCECTOMY FUSION N/A 09/09/2021   Procedure: Anterior  Cervical Decompression Fusion  Cervical four-five, Cervical five-six;  Surgeon: Karsten Ro, DO;  Location: [place];  Service: Neurosurgery;  Laterality: N/A;   BACK SURGERY     CARPAL TUNNEL  RELEASE Left 09/09/2021   Procedure: LEFT CARPAL TUNNEL RELEASE;  Surgeon: Karsten Ro, DO;  Location: [place];  Service: Neurosurgery;  Laterality: Left;   CEREBRAL ANGIOGRAM     x3   COLONOSCOPY WITH PROPOFOL N/A 08/18/2016   Procedure: COLONOSCOPY WITH PROPOFOL;  Surgeon: Carol Ada, MD;  Location: [facility];  Service: Endoscopy;  Laterality: N/A;   LAPAROSCOPIC HYSTERECTOMY     MANDIBLE FRACTURE SURGERY     RADIOLOGY WITH ANESTHESIA N/A 08/21/2013   Procedure: RADIOLOGY WITH ANESTHESIA;  Surgeon: Rob Hickman,  MD;  Location: [place];  Service: Radiology;  Laterality: N/A;   stones     no surgery    Family Psychiatric History: Denies  Family History:  Family History  Problem Relation Age of Onset   Heart disease Mother    Heart disease Father    Heart attack Father    Cancer Father        prostate to lymph node   Breast cancer Maternal Aunt    Breast cancer Cousin    Breast cancer Cousin    Lung cancer Other     Social History:  Social History   Socioeconomic History   Marital status: Married    Spouse name: Jori Moll   Number of children: 2   Years of education: 14   Highest education level: Not on file  Occupational History   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    Drug use: No   Sexual activity: Not Currently  Other Topics Concern   Not on file  Social History Narrative   Patient lives at home with her husband Jori Moll).   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    Patient is on disability.    Patient has an Associates degree.   Patient is right-handed.   Patient drinks 2 cups of green tea most days.               Social Determinants of Health   Financial Resource Strain: Low Risk  (09/28/2022)   Overall Financial Resource Strain (CARDIA)    Difficulty of Paying Living Expenses: Not hard at all  Food Insecurity: No Food Insecurity (09/28/2022)   Hunger Vital Sign    Worried About Running Out of Food in the Last Year: Never true    Ran Out of Food in the Last Year: Never true  Transportation Needs: No Transportation Needs (09/28/2022)   PRAPARE - Hydrologist (Medical): No    Lack of Transportation (Non-Medical): No  Physical Activity: Inactive (09/28/2022)   Exercise Vital Sign    Days of Exercise per Week: 0 days    Minutes of Exercise per Session: 0 min  Stress: Stress Concern Present (09/28/2022)   [place]    Feeling of Stress : To some extent  Social Connections: Not on file    Allergies:  Allergies  Allergen Reactions   Phenobarbital Hives, Shortness Of Breath and Swelling   Phenytoin Anaphylaxis, Hives and Swelling    Current Medications: Current Outpatient Medications  Medication Sig Dispense Refill   albuterol (PROVENTIL) (2.5 MG/3ML) 0.083% nebulizer solution Take 3 mLs (2.5 mg total) by nebulization every 6 (six) hours. 1080 mL 1   albuterol (VENTOLIN HFA) 108 (90 Base) MCG/ACT inhaler Inhale 2 puffs into the lungs every 6 (six)  hours as needed for wheezing or shortness of breath. 24 g 3   ALPRAZolam (XANAX) 0.25 MG tablet Take 0.5 tablets (0.125 mg total) by mouth 2 (two) times daily as needed. 30 tablet 1   aspirin EC 81 MG tablet Take 81 mg by mouth daily.     azithromycin (ZITHROMAX) 250 MG tablet Take 1 tablet (250 mg total) by mouth daily. Take first 2 tablets together, then 1 every day until  finished. 6 tablet 0   Biotin 1 MG CAPS Take 1 tablet by mouth 2 (two) times daily. Viviscal     Calcium-Magnesium-Zinc (CAL-MAG-ZINC PO) Take 1 tablet by mouth daily.     Cholecalciferol (VITAMIN D-3) 1000 units CAPS Take 2,000 Units by mouth at bedtime.     clopidogrel (PLAVIX) 75 MG tablet Take 1 tablet by mouth daily.     clopidogrel (PLAVIX) 75 MG tablet TAKE 1 TABLET DAILY 90 tablet 3   cyclobenzaprine (FLEXERIL) 5 MG tablet One tab po qpm prn 30 tablet 0   diclofenac Sodium (VOLTAREN) 1 % GEL Voltaren 1 % topical gel  APPLY 2 GRAM TO THE AFFECTED AREA(S) BY TOPICAL ROUTE 4 TIMES PER DAY 350 g 2   fluticasone (FLONASE) 50 MCG/ACT nasal spray Place 2 sprays into both nostrils daily as needed (allergies or post-nasl drip).  3   levETIRAcetam (KEPPRA) 500 MG tablet Take 1 tablet (500 mg total) by mouth every 12 (twelve) hours. 180 tablet 3   levothyroxine (SYNTHROID) 50 MCG tablet Take one tablet by mouth on Saturday and Sunday 39 tablet 3   levothyroxine (SYNTHROID) 75 MCG tablet TAKE 1 TABLET IN THE MORNING BEFORE BREAKFAST MONDAY THROUGH FRIDAY (TAKE 50 MCG ON OTHER DAYS) 51 tablet 4   loratadine (CLARITIN) 10 MG tablet Take 1 tablet (10 mg total) by mouth daily. 30 tablet 2   Multiple Vitamins-Minerals (PRESERVISION/LUTEIN PO) Take 1 tablet by mouth daily.     NONFORMULARY OR COMPOUNDED ITEM Apply 240 mg topically daily as needed. 1 each 3   Omega-3 Fatty Acids (FISH OIL) 1000 MG CAPS Take 2,000 mg by mouth 2 (two) times daily.      Polyethyl Glycol-Propyl Glycol (SYSTANE) 0.4-0.3 % SOLN Place 1 drop into both eyes daily as needed (dry eyes).     Psyllium (FIBER) 28.3 % POWD Take 10 mLs by mouth 2 (two) times daily.     rosuvastatin (CRESTOR) 20 MG tablet TAKE 1 TABLET DAILY (REPLACES 10 MG DOSE) 90 tablet 3   telmisartan (MICARDIS) 20 MG tablet Take 1 tablet (20 mg total) by mouth daily. 90 tablet 3   tiZANidine (ZANAFLEX) 4 MG tablet TAKE 1 TABLET EVERY EVENING AS NEEDED 14 tablet 0    vitamin C (ASCORBIC ACID) 500 MG tablet Take 1,000 mg by mouth at bedtime. Rose hips     escitalopram (LEXAPRO) 20 MG tablet Take 1 tablet (20 mg total) by mouth at bedtime. 90 tablet 0   traZODone (DESYREL) 50 MG tablet Take 1.5 tablets (75 mg total) by mouth at bedtime as needed for sleep. 90 tablet 1   No current facility-administered medications for this visit.     Musculoskeletal: Strength & Muscle Tone: decreased and right hemiparesis secondary to past stroke Gait & Station: unsteady Patient leans: Left  Psychiatric Specialty Exam: Review of Systems  Blood pressure 135/80, pulse 80, height '5\' 2"'$  (1.575 m), weight 160 lb (72.6 kg).Body mass index is 29.26 kg/m.  General Appearance: Well Groomed  Eye Contact:  Good  Speech:  Clear and Coherent and Normal Rate  Volume:  Normal  Mood:  Euthymic  Affect:  Congruent  Thought Process:  Coherent and Goal Directed  Orientation:  Full (Time, Place, and Person)  Thought Content: Logical   Suicidal Thoughts:  No  Homicidal Thoughts:  No  Memory:  Immediate;   Fair  Judgement:  Good  Insight:  Good  Psychomotor Activity:  Normal  Concentration:  Concentration: Good  Recall:  [place] of Knowledge: Fair  Language: Good  Akathisia:  NA    AIMS (if indicated): not done  Assets:  Communication Skills Desire for Improvement Housing Intimacy Resilience Social Support Transportation Vocational/Educational  ADL's:  Intact  Cognition: WNL  Sleep:  Fair   Metabolic Disorder Labs: Lab Results  Component Value Date   HGBA1C 5.6 09/16/2020   No results found for: "PROLACTIN" Lab Results  Component Value Date   CHOL 118 05/18/2022   TRIG 110 05/18/2022   HDL 63 05/18/2022   CHOLHDL 1.9 05/18/2022   VLDL 15 11/23/2009   LDLCALC 35 05/18/2022   LDLCALC 52 07/26/2021   Lab Results  Component Value Date   TSH 1.500 09/28/2022   TSH 0.590 05/18/2022    Therapeutic Level Labs: No results found for: "LITHIUM" No  results found for: "VALPROATE" No results found for: "CBMZ"   Screenings: GAD-7    Flowsheet Row Office Visit from 07/04/2022 in [facility]  Total GAD-7 Score 16      Mini-Mental    [place] from 09/03/2018 in [facility] Office Visit from 11/03/2015 in [facility] Office Visit from 05/05/2015 in [facility] Office Visit from 11/05/2014 in [facility]  Total Score (max 30 points ) '29 27 26 28      '$ PHQ2-9    Flowsheet Row Clinical Support from 09/28/2022 in [facility] Office Visit from 07/04/2022 in [facility] Office Visit from 05/18/2022 in [facility] Office Visit from 11/15/2021 in [place] from 09/22/2021 in [facility]  PHQ-2 Total Score 0 '5 6 2 '$ 0  PHQ-9 Total Score -- '19 17 7 '$ --      Flowsheet Row ED from 08/09/2022 in [facility] Office Visit from 07/04/2022 in [facility] Admission (Discharged) from 09/09/2021 in [facility] No Risk No Risk No Risk       Collaboration of Care: Collaboration of Care: Medication Management AEB medication prescription  Patient/Guardian was advised Release of Information must be obtained prior to any record release in order to collaborate their care with an outside provider. Patient/Guardian was advised if they have not already done so to contact the registration department to sign all necessary forms in order for [place] to release information regarding their care.   Consent: Patient/Guardian gives verbal consent for treatment and  assignment of benefits for services provided during this visit. Patient/Guardian expressed understanding and agreed to proceed.    Vista Mink, MD 11/16/2022, 1:23 PM

## 2022-11-21 ENCOUNTER — Ambulatory Visit (INDEPENDENT_AMBULATORY_CARE_PROVIDER_SITE_OTHER): Payer: Medicare Other | Admitting: Internal Medicine

## 2022-11-21 ENCOUNTER — Encounter: Payer: Self-pay | Admitting: Internal Medicine

## 2022-11-21 VITALS — BP 136/70 | HR 87 | Temp 97.8°F | Ht 62.0 in | Wt 162.2 lb

## 2022-11-21 DIAGNOSIS — I10 Essential (primary) hypertension: Secondary | ICD-10-CM

## 2022-11-21 DIAGNOSIS — I700 Atherosclerosis of aorta: Secondary | ICD-10-CM | POA: Diagnosis not present

## 2022-11-21 DIAGNOSIS — H353211 Exudative age-related macular degeneration, right eye, with active choroidal neovascularization: Secondary | ICD-10-CM | POA: Diagnosis not present

## 2022-11-21 DIAGNOSIS — Z6829 Body mass index (BMI) 29.0-29.9, adult: Secondary | ICD-10-CM

## 2022-11-21 DIAGNOSIS — Z79899 Other long term (current) drug therapy: Secondary | ICD-10-CM | POA: Diagnosis not present

## 2022-11-21 DIAGNOSIS — E663 Overweight: Secondary | ICD-10-CM

## 2022-11-21 DIAGNOSIS — R202 Paresthesia of skin: Secondary | ICD-10-CM

## 2022-11-21 DIAGNOSIS — J449 Chronic obstructive pulmonary disease, unspecified: Secondary | ICD-10-CM

## 2022-11-21 DIAGNOSIS — E039 Hypothyroidism, unspecified: Secondary | ICD-10-CM

## 2022-11-21 LAB — POCT URINALYSIS DIPSTICK
Bilirubin, UA: NEGATIVE
Glucose, UA: NEGATIVE
Ketones, UA: NEGATIVE
Leukocytes, UA: NEGATIVE
Nitrite, UA: NEGATIVE
Protein, UA: NEGATIVE
Spec Grav, UA: 1.01 (ref 1.010–1.025)
Urobilinogen, UA: 0.2 E.U./dL
pH, UA: 7 (ref 5.0–8.0)

## 2022-11-21 NOTE — Progress Notes (Signed)
Barnet Glasgow Martin,acting as a Education administrator for [name], MD.,have documented all relevant documentation on the behalf of [name], MD,as directed by  [name], MD while in the presence of [name], MD.    Subjective:     Patient ID: [name] , female    DOB: 02/28/1956 , 67 y.o.   MRN: [id number]   Chief Complaint  Patient presents with   Hypertension    HPI  She presents today for BP check.  She reports compliance with meds.  She reports compliance with medication daily.  She denies having any dizziness, headaches and chest pain.  BP Readings from Last 3 Encounters: 11/21/22 : 136/70 10/04/22 : 130/69 09/28/22 : 124/62    Hypertension This is a chronic problem. The current episode started more than 1 year ago. The problem has been gradually improving since onset. The problem is controlled. Pertinent negatives include no blurred vision, chest pain, headaches, palpitations or shortness of breath. Risk factors for coronary artery disease include post-menopausal state and sedentary lifestyle. The current treatment provides moderate improvement. Compliance problems include exercise.      Past Medical History:  Diagnosis Date   Abnormal gait    Amnesia    Anemia    hx   Aneurysm (HCC)    Anxiety    Asthma    Atrophic vaginitis    Benign hypertensive heart disease    Cardiomegaly    Chest pain    COPD (chronic obstructive pulmonary disease) (HCC)     - PFTs  01/24/06 FEV1 69% ratio 68% diffusing capacity 57% with no improvement after B2   - PFT's 10/1/ 09 FEV1 77   ratio  76    dlco                   45            no resp to B2    - Nl alpha one antitripsin level 10/09   Cough    Depression    Difficulty speaking    Disorder of nasal cavity    Dyspnea    Enthesopathy of elbow region    GERD (gastroesophageal reflux disease)    Hammer toe    Headache(784.0)    Hemiparesis affecting dominant side as late effect of cerebrovascular accident  ([place])    History of kidney stones    Hyperlipidemia    Hypertension    Hypothyroidism    Increased urinary frequency    Low compliance bladder    Macular degeneration of both eyes    Malaise and fatigue    Nonruptured cerebral aneurysm    Obesity    Pneumonia    hx   Pure hypercholesterolemia    Recurrent major depressive episodes (HCC)    Seizure disorder, secondary ([place])    2-3 focal siezures monthly   Seizures ([place])    Short-term memory loss    since stroke   Skin sensation disturbance    Stroke ([place]) 2011   rt sided weakness   Swelling of limb    TMJ (temporomandibular joint disorder)    Urinary tract infectious disease    Vertigo as late effect of stroke    Vitamin D deficiency      Family History  Problem Relation Age of Onset   Heart disease Mother    Heart disease Father    Heart attack Father    Cancer Father  prostate to lymph node   Breast cancer Maternal Aunt    Breast cancer Cousin    Breast cancer Cousin    Lung cancer Other      Current Outpatient Medications:    albuterol (PROVENTIL) (2.5 MG/3ML) 0.083% nebulizer solution, Take 3 mLs (2.5 mg total) by nebulization every 6 (six) hours., Disp: 1080 mL, Rfl: 1   albuterol (VENTOLIN HFA) 108 (90 Base) MCG/ACT inhaler, Inhale 2 puffs into the lungs every 6 (six) hours as needed for wheezing or shortness of breath., Disp: 24 g, Rfl: 3   aspirin EC 81 MG tablet, Take 81 mg by mouth daily., Disp: , Rfl:    Biotin 1 MG CAPS, Take 1 tablet by mouth 2 (two) times daily. Viviscal, Disp: , Rfl:    Calcium-Magnesium-Zinc (CAL-MAG-ZINC PO), Take 1 tablet by mouth daily., Disp: , Rfl:    Cholecalciferol (VITAMIN D-3) 1000 units CAPS, Take 2,000 Units by mouth at bedtime., Disp: , Rfl:    clopidogrel (PLAVIX) 75 MG tablet, TAKE 1 TABLET DAILY, Disp: 90 tablet, Rfl: 3   cyclobenzaprine (FLEXERIL) 5 MG tablet, One tab po qpm prn, Disp: 30 tablet, Rfl: 0   diclofenac Sodium (VOLTAREN) 1 % GEL, Voltaren 1 %  topical gel  APPLY 2 GRAM TO THE AFFECTED AREA(S) BY TOPICAL ROUTE 4 TIMES PER DAY, Disp: 350 g, Rfl: 2   escitalopram (LEXAPRO) 20 MG tablet, Take 1 tablet (20 mg total) by mouth at bedtime., Disp: 90 tablet, Rfl: 0   fluticasone (FLONASE) 50 MCG/ACT nasal spray, Place 2 sprays into both nostrils daily as needed (allergies or post-nasl drip)., Disp: , Rfl: 3   levETIRAcetam (KEPPRA) 500 MG tablet, Take 1 tablet (500 mg total) by mouth every 12 (twelve) hours., Disp: 180 tablet, Rfl: 3   levothyroxine (SYNTHROID) 50 MCG tablet, Take one tablet by mouth on Saturday and Sunday, Disp: 39 tablet, Rfl: 3   levothyroxine (SYNTHROID) 75 MCG tablet, TAKE 1 TABLET IN THE MORNING BEFORE BREAKFAST MONDAY THROUGH FRIDAY (TAKE 50 MCG ON OTHER DAYS), Disp: 51 tablet, Rfl: 4   loratadine (CLARITIN) 10 MG tablet, Take 1 tablet (10 mg total) by mouth daily., Disp: 30 tablet, Rfl: 2   Multiple Vitamins-Minerals (PRESERVISION/LUTEIN PO), Take 1 tablet by mouth daily., Disp: , Rfl:    Omega-3 Fatty Acids (FISH OIL) 1000 MG CAPS, Take 2,000 mg by mouth 2 (two) times daily. , Disp: , Rfl:    Polyethyl Glycol-Propyl Glycol (SYSTANE) 0.4-0.3 % SOLN, Place 1 drop into both eyes daily as needed (dry eyes)., Disp: , Rfl:    Psyllium (FIBER) 28.3 % POWD, Take 10 mLs by mouth 2 (two) times daily., Disp: , Rfl:    rosuvastatin (CRESTOR) 20 MG tablet, TAKE 1 TABLET DAILY (REPLACES 10 MG DOSE), Disp: 90 tablet, Rfl: 3   telmisartan (MICARDIS) 20 MG tablet, Take 1 tablet (20 mg total) by mouth daily., Disp: 90 tablet, Rfl: 3   tiZANidine (ZANAFLEX) 4 MG tablet, TAKE 1 TABLET EVERY EVENING AS NEEDED, Disp: 14 tablet, Rfl: 0   traZODone (DESYREL) 50 MG tablet, Take 1.5 tablets (75 mg total) by mouth at bedtime as needed for sleep., Disp: 90 tablet, Rfl: 1   vitamin C (ASCORBIC ACID) 500 MG tablet, Take 1,000 mg by mouth at bedtime. Rose hips, Disp: , Rfl:    NONFORMULARY OR COMPOUNDED ITEM, Apply 240 mg topically daily as needed.  (Patient not taking: Reported on 11/21/2022), Disp: 1 each, Rfl: 3   Allergies  Allergen Reactions  Phenobarbital Hives, Shortness Of Breath and Swelling   Phenytoin Anaphylaxis, Hives and Swelling     Review of Systems  Constitutional: Negative.   Eyes:  Negative for blurred vision.  Respiratory: Negative.  Negative for shortness of breath.   Cardiovascular: Negative.  Negative for chest pain and palpitations.  Musculoskeletal: Negative.   Skin: Negative.   Neurological:  Positive for numbness. Negative for headaches.       She c/o numbness/tingling in b/l LE. She denies any worsening LE weakness. She is not sure what is contributing to her sx.   Psychiatric/Behavioral: Negative.       Today's Vitals   11/21/22 1224 11/21/22 1248  BP: 136/70 136/70  Pulse: 87   Temp: 97.8 F (36.6 C)   TempSrc: Oral   Weight: 162 lb 3.2 oz (73.6 kg)   Height: 5\' 2"  (1.575 m)   PainSc: 0-No pain    Body mass index is 29.67 kg/m.  Wt Readings from Last 3 Encounters:  11/21/22 162 lb 3.2 oz (73.6 kg)  10/04/22 161 lb (73 kg)  09/28/22 159 lb 3.2 oz (72.2 kg)    Objective:  Physical Exam Vitals and nursing note reviewed.  Constitutional:      Appearance: Normal appearance.  HENT:     Head: Normocephalic and atraumatic.     Nose:     Comments: Masked     Mouth/Throat:     Comments: Masked  Eyes:     Extraocular Movements: Extraocular movements intact.  Cardiovascular:     Rate and Rhythm: Normal rate and regular rhythm.     Heart sounds: Normal heart sounds.  Pulmonary:     Effort: Pulmonary effort is normal.     Breath sounds: Normal breath sounds.  Musculoskeletal:     Comments: Ambulatory w/ cane  Skin:    General: Skin is warm.  Neurological:     General: No focal deficit present.     Mental Status: She is alert.  Psychiatric:        Mood and Affect: Mood normal.        Behavior: Behavior normal.     Assessment And Plan:     1. Primary hypertension Comments:  Chronic, fair control. Goal BP<130/80. She will c/w telmisartan 20mg  daily.  She will f/u in 4-6 months for re-evaluation. - CMP14+EGFR - POCT Urinalysis Dipstick (81002)  2. Acquired hypothyroidism Comments: TSH  normal in January. She will c/w levothyroxine 75mg  M-F, 82mcg Sat/Sundays. I will recheck her levels in four months.  3. Exudative age-related macular degeneration of right eye with active choroidal neovascularization ([place]) Comments: Chronic, she is followed by ophthalmology.  She reports her condition has stabilized.  4. Chronic obstructive pulmonary disease, unspecified COPD type ([place]) Comments: Chronic, sx are stable. She will c/w nebulizer treatments prn. She will f/u w/ Pulmonary as needed.  5. Atherosclerosis of aorta (HCC) Comments: Chronic, LDL goal < 70.  She will c/w rosuvastatin 20mg  daily. She is encouraged to follow a heart healthy lifestyle.  6. Paresthesia of both lower extremities Comments: She declines rx gabapentin due to possible side effects.  She plans to discuss further with Neuro.  7. Overweight with body mass index (BMI) of 29 to 29.9 in adult Comments: She is encouraged to ai for at least 150 minutes of exercise/week.  8. Pharmacologic therapy - Levetiracetam level   Patient was given opportunity to ask questions. Patient verbalized understanding of the plan and was able to repeat key elements of the plan.  All questions were answered to their satisfaction.   I, [name], MD, have reviewed all documentation for this visit. The documentation on 11/21/22 for the exam, diagnosis, procedures, and orders are all accurate and complete.   IF YOU HAVE BEEN REFERRED TO A SPECIALIST, IT MAY TAKE 1-2 WEEKS TO SCHEDULE/PROCESS THE REFERRAL. IF YOU HAVE NOT HEARD FROM US/SPECIALIST IN TWO WEEKS, PLEASE GIVE [place] A CALL AT [phone].   THE PATIENT IS ENCOURAGED TO PRACTICE SOCIAL DISTANCING DUE TO THE COVID-19 PANDEMIC.

## 2022-11-21 NOTE — Patient Instructions (Signed)
Hypertension, Adult ?Hypertension is another name for high blood pressure. High blood pressure forces your heart to work harder to pump blood. This can cause problems over time. ?There are two numbers in a blood pressure reading. There is a top number (systolic) over a bottom number (diastolic). It is best to have a blood pressure that is below 120/80. ?What are the causes? ?The cause of this condition is not known. Some other conditions can lead to high blood pressure. ?What increases the risk? ?Some lifestyle factors can make you more likely to develop high blood pressure: ?Smoking. ?Not getting enough exercise or physical activity. ?Being overweight. ?Having too much fat, sugar, calories, or salt (sodium) in your diet. ?Drinking too much alcohol. ?Other risk factors include: ?Having any of these conditions: ?Heart disease. ?Diabetes. ?High cholesterol. ?Kidney disease. ?Obstructive sleep apnea. ?Having a family history of high blood pressure and high cholesterol. ?Age. The risk increases with age. ?Stress. ?What are the signs or symptoms? ?High blood pressure may not cause symptoms. Very high blood pressure (hypertensive crisis) may cause: ?Headache. ?Fast or uneven heartbeats (palpitations). ?Shortness of breath. ?Nosebleed. ?Vomiting or feeling like you may vomit (nauseous). ?Changes in how you see. ?Very bad chest pain. ?Feeling dizzy. ?Seizures. ?How is this treated? ?This condition is treated by making healthy lifestyle changes, such as: ?Eating healthy foods. ?Exercising more. ?Drinking less alcohol. ?Your doctor may prescribe medicine if lifestyle changes do not help enough and if: ?Your top number is above 130. ?Your bottom number is above 80. ?Your personal target blood pressure may vary. ?Follow these instructions at home: ?Eating and drinking ? ?If told, follow the DASH eating plan. To follow this plan: ?Fill one half of your plate at each meal with fruits and vegetables. ?Fill one fourth of your plate  at each meal with whole grains. Whole grains include whole-wheat pasta, brown rice, and whole-grain bread. ?Eat or drink low-fat dairy products, such as skim milk or low-fat yogurt. ?Fill one fourth of your plate at each meal with low-fat (lean) proteins. Low-fat proteins include fish, chicken without skin, eggs, beans, and tofu. ?Avoid fatty meat, cured and processed meat, or chicken with skin. ?Avoid pre-made or processed food. ?Limit the amount of salt in your diet to less than 1,500 mg each day. ?Do not drink alcohol if: ?Your doctor tells you not to drink. ?You are pregnant, may be pregnant, or are planning to become pregnant. ?If you drink alcohol: ?Limit how much you have to: ?0-1 drink a day for women. ?0-2 drinks a day for men. ?Know how much alcohol is in your drink. In the [place] one drink equals one 12 oz bottle of beer (355 mL), one 5 oz glass of wine (148 mL), or one 1? oz glass of hard liquor (44 mL). ?Lifestyle ? ?Work with your doctor to stay at a healthy weight or to lose weight. Ask your doctor what the best weight is for you. ?Get at least 30 minutes of exercise that causes your heart to beat faster (aerobic exercise) most days of the week. This may include walking, swimming, or biking. ?Get at least 30 minutes of exercise that strengthens your muscles (resistance exercise) at least 3 days a week. This may include lifting weights or doing Pilates. ?Do not smoke or use any products that contain nicotine or tobacco. If you need help quitting, ask your doctor. ?Check your blood pressure at home as told by your doctor. ?Keep all follow-up visits. ?Medicines ?Take over-the-counter and prescription medicines   only as told by your doctor. Follow directions carefully. ?Do not skip doses of blood pressure medicine. The medicine does not work as well if you skip doses. Skipping doses also puts you at risk for problems. ?Ask your doctor about side effects or reactions to medicines that you should watch  for. ?Contact a doctor if: ?You think you are having a reaction to the medicine you are taking. ?You have headaches that keep coming back. ?You feel dizzy. ?You have swelling in your ankles. ?You have trouble with your vision. ?Get help right away if: ?You get a very bad headache. ?You start to feel mixed up (confused). ?You feel weak or numb. ?You feel faint. ?You have very bad pain in your: ?Chest. ?Belly (abdomen). ?You vomit more than once. ?You have trouble breathing. ?These symptoms may be an emergency. Get help right away. Call 911. ?Do not wait to see if the symptoms will go away. ?Do not drive yourself to the hospital. ?Summary ?Hypertension is another name for high blood pressure. ?High blood pressure forces your heart to work harder to pump blood. ?For most people, a normal blood pressure is less than 120/80. ?Making healthy choices can help lower blood pressure. If your blood pressure does not get lower with healthy choices, you may need to take medicine. ?This information is not intended to replace advice given to you by your health care provider. Make sure you discuss any questions you have with your health care provider. ?Document Revised: 06/10/2021 Document Reviewed: 06/10/2021 ?Elsevier Patient Education ? 2023 [employer] ? ?

## 2022-11-23 LAB — CMP14+EGFR
ALT: 19 IU/L (ref 0–32)
AST: 24 IU/L (ref 0–40)
Albumin/Globulin Ratio: 1.8 (ref 1.2–2.2)
Albumin: 4.4 g/dL (ref 3.9–4.9)
Alkaline Phosphatase: 94 IU/L (ref 44–121)
BUN/Creatinine Ratio: 24 (ref 12–28)
BUN: 15 mg/dL (ref 8–27)
Bilirubin Total: 0.7 mg/dL (ref 0.0–1.2)
CO2: 26 mmol/L (ref 20–29)
Calcium: 9.6 mg/dL (ref 8.7–10.3)
Chloride: 101 mmol/L (ref 96–106)
Creatinine, Ser: 0.63 mg/dL (ref 0.57–1.00)
Globulin, Total: 2.5 g/dL (ref 1.5–4.5)
Glucose: 90 mg/dL (ref 70–99)
Potassium: 4.5 mmol/L (ref 3.5–5.2)
Sodium: 141 mmol/L (ref 134–144)
Total Protein: 6.9 g/dL (ref 6.0–8.5)
eGFR: 98 mL/min/{1.73_m2} (ref >59)

## 2022-11-23 LAB — LEVETIRACETAM LEVEL: Levetiracetam Lvl: 18.8 ug/mL (ref 10.0–40.0)

## 2022-11-28 ENCOUNTER — Encounter: Payer: Self-pay | Admitting: Internal Medicine

## 2022-11-29 NOTE — Telephone Encounter (Signed)
The message subject says POC Urine Dipstick

## 2022-12-08 ENCOUNTER — Telehealth: Payer: Self-pay | Admitting: Pulmonary Disease

## 2022-12-08 DIAGNOSIS — J449 Chronic obstructive pulmonary disease, unspecified: Secondary | ICD-10-CM

## 2022-12-08 MED ORDER — ALBUTEROL SULFATE HFA 108 (90 BASE) MCG/ACT IN AERS: 2.0000 | INHALATION_SPRAY | Freq: Four times a day (QID) | RESPIRATORY_TRACT | 3 refills | Status: DC | PRN

## 2022-12-08 NOTE — Telephone Encounter (Signed)
Patient called to request a refill for her Proair inhaler.  She stated that she would like a 3 month supply because it costs the same as the 1 month supply from [facility].  Please advise.  CB# [phone]

## 2022-12-08 NOTE — Telephone Encounter (Signed)
Called and spoke with patient. Advised patient I would send 3 month supply in to pharmacy for her. Advised patient she would need a follow up soon to continue getting refills as well. Patient scheduled f/u appointment with Dr. Ander Slade.   Nothing further needed.

## 2022-12-15 ENCOUNTER — Telehealth: Payer: Self-pay | Admitting: Pulmonary Disease

## 2022-12-15 DIAGNOSIS — J449 Chronic obstructive pulmonary disease, unspecified: Secondary | ICD-10-CM

## 2022-12-15 MED ORDER — ALBUTEROL SULFATE HFA 108 (90 BASE) MCG/ACT IN AERS: 2.0000 | INHALATION_SPRAY | Freq: Four times a day (QID) | RESPIRATORY_TRACT | 3 refills | Status: DC | PRN

## 2022-12-15 NOTE — Telephone Encounter (Signed)
Patient states [facility] does not have RX for Albuterol inhaler. Needs 3 month supply. Patient phone number is [phone].

## 2022-12-15 NOTE — Telephone Encounter (Signed)
Rx for pt's rescue inhaler has been sent to [facility] for pt as a 90-day supply. Called and spoke with pt letting her know this had been done and she verbalized understanding. Nothing further needed.

## 2022-12-21 ENCOUNTER — Other Ambulatory Visit: Payer: Self-pay | Admitting: Internal Medicine

## 2022-12-21 ENCOUNTER — Other Ambulatory Visit (HOSPITAL_COMMUNITY): Payer: Self-pay | Admitting: Psychiatry

## 2022-12-21 ENCOUNTER — Encounter: Payer: Self-pay | Admitting: Cardiology

## 2022-12-21 DIAGNOSIS — F331 Major depressive disorder, recurrent, moderate: Secondary | ICD-10-CM

## 2022-12-21 DIAGNOSIS — F411 Generalized anxiety disorder: Secondary | ICD-10-CM

## 2022-12-21 DIAGNOSIS — E039 Hypothyroidism, unspecified: Secondary | ICD-10-CM

## 2022-12-21 NOTE — Telephone Encounter (Signed)
Error

## 2022-12-26 ENCOUNTER — Encounter: Payer: Self-pay | Admitting: Pulmonary Disease

## 2022-12-26 ENCOUNTER — Ambulatory Visit (INDEPENDENT_AMBULATORY_CARE_PROVIDER_SITE_OTHER): Payer: Medicare Other | Admitting: Pulmonary Disease

## 2022-12-26 DIAGNOSIS — J449 Chronic obstructive pulmonary disease, unspecified: Secondary | ICD-10-CM | POA: Diagnosis not present

## 2022-12-26 MED ORDER — ALBUTEROL SULFATE (2.5 MG/3ML) 0.083% IN NEBU: 2.5000 mg | INHALATION_SOLUTION | Freq: Four times a day (QID) | RESPIRATORY_TRACT | 1 refills | Status: AC

## 2022-12-26 MED ORDER — ALBUTEROL SULFATE HFA 108 (90 BASE) MCG/ACT IN AERS: 2.0000 | INHALATION_SPRAY | Freq: Four times a day (QID) | RESPIRATORY_TRACT | 3 refills | Status: DC | PRN

## 2022-12-26 NOTE — Patient Instructions (Addendum)
Continue albuterol as needed  Continue graded activities as tolerated  Contact the medical supply company about oxygen use when you are traveling, they should be able to contact us to fill out any paperwork that is needed  I will see you back in about 6 months  Call with significant concerns  We can consider getting a breathing study if you are needing your inhaler more frequently

## 2022-12-26 NOTE — Progress Notes (Signed)
Subjective:    Patient ID: [name], female    DOB: [birth year], 67 y.o.   MRN: [id number]  Follow-up for chronic obstructive pulmonary disease  Breathing feels about the same Has been relatively stable, uses nebulization treatments as needed  Quit smoking in 2011 CVA in 2011 with prolonged recovery   History of obstructive lung disease  Uses oxygen supplementation at night  Previous PFT shows obstructive disease with significant bronchodilator response   She remembers being on oxygen prior to her stroke, remembers a diagnosis of COPD/emphysema She was using inhalers and nebulizers prior to that  Currently able to ambulate, does get short of breath with exertion Uses a walker, unsteady, if she goes slowly she is able to take of 100 steps  Has been trying to get more active and started going to the gym  She does have daytime fatigue and sleepiness Multiple medications may be contributing to this   Comorbidities include hypertension, hypercholesterolemia, chronic headaches, CVA     Past Medical History:  Diagnosis Date   Abnormal gait    Amnesia    Anemia    hx   Aneurysm    Anxiety    Asthma    Atrophic vaginitis    Benign hypertensive heart disease    Cardiomegaly    Chest pain    COPD (chronic obstructive pulmonary disease)     - PFTs  01/24/06 FEV1 69% ratio 68% diffusing capacity 57% with no improvement after B2   - PFT's 10/1/ 09 FEV1 77   ratio  76    dlco                   45            no resp to B2    - Nl alpha one antitripsin level 10/09   Cough    Depression    Difficulty speaking    Disorder of nasal cavity    Dyspnea    Enthesopathy of elbow region    GERD (gastroesophageal reflux disease)    Hammer toe    Headache(784.0)    Hemiparesis affecting dominant side as late effect of cerebrovascular accident    History of kidney stones    Hyperlipidemia    Hypertension    Hypothyroidism    Increased urinary frequency    Low  compliance bladder    Macular degeneration of both eyes    Malaise and fatigue    Nonruptured cerebral aneurysm    Obesity    Pneumonia    hx   Pure hypercholesterolemia    Recurrent major depressive episodes    Seizure disorder, secondary    2-3 focal siezures monthly   Seizures    Short-term memory loss    since stroke   Skin sensation disturbance    Stroke 2011   rt sided weakness   Swelling of limb    TMJ (temporomandibular joint disorder)    Urinary tract infectious disease    Vertigo as late effect of stroke    Vitamin D deficiency    Social History   Socioeconomic History   Marital status: Married    Spouse name: Windy Fast   Number of children: 2   Years of education: 14   Highest education level: Not on file  Occupational History   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    Drug use: No   Sexual activity: Not Currently  Other Topics Concern   Not on file  Social History Narrative   Patient lives at home with her husband Windy Fast).   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    Patient is on disability.    Patient has an Associates degree.   Patient is right-handed.   Patient drinks 2 cups of green tea most days.               Social Determinants of Health   Financial Resource Strain: Low Risk  (09/28/2022)   Overall Financial Resource Strain (CARDIA)    Difficulty of Paying Living Expenses: Not hard at all   Food Insecurity: No Food Insecurity (09/28/2022)   Hunger Vital Sign    Worried About Running Out of Food in the Last Year: Never true    Ran Out of Food in the Last Year: Never true  Transportation Needs: No Transportation Needs (09/28/2022)   PRAPARE - Administrator, Civil Service (Medical): No    Lack of Transportation (Non-Medical): No  Physical Activity: Inactive (09/28/2022)   Exercise Vital Sign    Days of Exercise per Week: 0 days    Minutes of Exercise per Session: 0 min  Stress: Stress Concern Present (09/28/2022)   [employer] of Occupational Health - Occupational Stress Questionnaire    Feeling of Stress : To some extent  Social Connections: Not on file  Intimate Partner Violence: Not on file   Family History  Problem Relation Age of Onset   Heart disease Mother    Heart disease Father    Heart attack Father    Cancer Father        prostate to lymph node   Breast cancer Maternal Aunt    Breast cancer Cousin    Breast cancer Cousin    Lung cancer Other    Review of Systems  Constitutional:  Negative for fever and unexpected weight change.  HENT:  Negative for congestion, dental problem, ear pain, nosebleeds, postnasal drip, rhinorrhea, sinus pressure, sneezing, sore throat and trouble swallowing.   Eyes:  Negative for redness and itching.  Respiratory:  Positive for cough and shortness of breath. Negative for chest tightness and wheezing.   Cardiovascular:  Negative for palpitations.  Gastrointestinal:  Negative for nausea and vomiting.  Genitourinary:  Negative for dysuria.  Musculoskeletal:  Positive for joint swelling.  Skin:  Negative for rash.  Allergic/Immunologic: Negative.  Negative for environmental allergies, food allergies and immunocompromised state.  Psychiatric/Behavioral:  Negative for dysphoric mood.        Objective:   Physical Exam Constitutional:      Appearance: Normal appearance.  HENT:     Head: Normocephalic and  atraumatic.     Mouth/Throat:     Mouth: Mucous membranes are moist.  Cardiovascular:     Rate and Rhythm: Normal rate and  regular rhythm.     Pulses: Normal pulses.     Heart sounds: Normal heart sounds. No murmur heard.    No friction rub.  Pulmonary:     Effort: Pulmonary effort is normal. No respiratory distress.     Breath sounds: Normal breath sounds. No stridor. No wheezing or rhonchi.  Musculoskeletal:     Cervical back: Normal range of motion. No rigidity or tenderness.  Neurological:     Mental Status: She is alert.  Psychiatric:        Mood and Affect: Mood normal.    Vitals:   12/26/22 1520  BP: 112/72  Pulse: 75  SpO2: 95%    Recent PFT reviewed showing small airway disease with significant bronchodilator response in the small airways Nocturnal oximetry with nocturnal desaturations  Low-dose CT scan of the chest performed January 2022-no lung nodules, some evidence of bronchiectasis    Assessment & Plan:  .  History of COPD  .  Nocturnal desaturate or on oxygen supplementation at night  .  Breathing has been relatively stable     .  Encouraged to stay active  .  Encouraged to call with significant concerns  .  Refills for inhaler sent  .  Follow-up in 6 months

## 2022-12-28 ENCOUNTER — Ambulatory Visit (HOSPITAL_BASED_OUTPATIENT_CLINIC_OR_DEPARTMENT_OTHER): Payer: Medicare Other | Admitting: Psychiatry

## 2022-12-28 ENCOUNTER — Encounter (HOSPITAL_COMMUNITY): Payer: Self-pay | Admitting: Psychiatry

## 2022-12-28 VITALS — BP 164/84 | HR 84 | Ht 62.5 in | Wt 160.0 lb

## 2022-12-28 DIAGNOSIS — F411 Generalized anxiety disorder: Secondary | ICD-10-CM | POA: Diagnosis not present

## 2022-12-28 DIAGNOSIS — F331 Major depressive disorder, recurrent, moderate: Secondary | ICD-10-CM | POA: Diagnosis not present

## 2022-12-28 MED ORDER — ESCITALOPRAM OXALATE 20 MG PO TABS: 20.0000 mg | ORAL_TABLET | Freq: Every day | ORAL | 1 refills | Status: DC

## 2022-12-28 NOTE — Progress Notes (Signed)
BH MD/PA/NP OP Progress Note  12/28/2022 9:23 AM [name]  MRN:  [id number]  Visit Diagnosis:    ICD-10-CM   1. GAD (generalized anxiety disorder)  F41.1 escitalopram (LEXAPRO) 20 MG tablet    2. Moderate episode of recurrent major depressive disorder  F33.1 escitalopram (LEXAPRO) 20 MG tablet      Assessment: [name] is a 67 y.o. 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    During initial evaluation patient reported symptoms of depression including anhedonia, feelings of hopelessness, increased fatigue, decreased appetite, difficulty sleeping, difficulty concentrating, and feelings of worthlessness.  She denied any SI/HI or thoughts of self-harm along with any past history of mania or psychosis.  Patient also endorsed symptoms of anxiety including feeling nervous or on edge, being unable to control her worrying, worrying too much with different things, difficulty relaxing, and irritability.  Of note patient had a left frontoparietal parenchymal hemorrhage in March 2011 which correlated with the onset of her cognitive impairment, onset of seizures, and onset of depressive symptoms.  Patient was started on Keppra around that time and we discussed the possibility that it could be contributing to her increased irritability/agitation.    Anselm Lis Pastorino presents for follow-up evaluation. Today, 12/28/22, patient reports that mood and anxiety have been well controlled on her current regimen. She has been having some trouble sleeping and is taking Trazodone 100 mg every other night. We will increase the dose of Trazodone to 100 mg nightly today and reviewed risks/benefits. Patient will follow up in 6-8 weeks.   Plan: - Continue Lexapro (escitalopram) 20 mg at bedtime  - Increase trazodone to 100  mg at bedtime as needed - EKG reviewed QTC 462 - Discuss Keppra with neurologist - CBC, CMP, lipid profile, Vit D, and TSH reviewed - Follow up in a 6-8 weeks  Chief Complaint:  Chief Complaint  Patient presents with   Follow-up   HPI: [name] presents reporting that things have been going well for the past month and half. She has felt that both her mood and anxiety have been well controlled. She occasionally can have some periods of high anxiety, but these have decreased considerably compared to the past. In regards to the Xanax she has not noticed any significant difference being off the medication. Patient is getting ready to go to [place] for 10 days to see her husbands family and attend his high school reunion.   Her only concern today is related to sleep. She has been taking Trazoedone 100 mg most nights and finds that it works well some days while others she wont fall asleep until an hour later. We discussed this medicine and some adjustments that could be made to try and improve the efficacy/sleep latency period.  Past Psychiatric History: Patient reports that her psychiatric symptoms/depression started after 2011 when she had her stroke prior to that she reports minimal to no depressive symptoms.  She has been on Celexa for as long as she can remember post stroke and believes she was started on Xanax at the same time.  Patient denies any prior psychiatric hospitalizations or past suicide attempts.  She denies any current substance use since 2005 and reports drinking alcohol when she was younger.   Past Medical History:  Past Medical History:  Diagnosis Date   Abnormal gait    Amnesia  Anemia    hx   Aneurysm    Anxiety    Asthma    Atrophic vaginitis    Benign hypertensive heart disease    Cardiomegaly    Chest pain    COPD (chronic obstructive pulmonary disease)     - PFTs  01/24/06 FEV1 69% ratio 68% diffusing capacity 57% with no improvement after B2   - PFT's 10/1/ 09  FEV1 77   ratio  76    dlco                   45            no resp to B2    - Nl alpha one antitripsin level 10/09   Cough    Depression    Difficulty speaking    Disorder of nasal cavity    Dyspnea    Enthesopathy of elbow region    GERD (gastroesophageal reflux disease)    Hammer toe    Headache(784.0)    Hemiparesis affecting dominant side as late effect of cerebrovascular accident    History of kidney stones    Hyperlipidemia    Hypertension    Hypothyroidism    Increased urinary frequency    Low compliance bladder    Macular degeneration of both eyes    Malaise and fatigue    Nonruptured cerebral aneurysm    Obesity    Pneumonia    hx   Pure hypercholesterolemia    Recurrent major depressive episodes    Seizure disorder, secondary    2-3 focal siezures monthly   Seizures    Short-term memory loss    since stroke   Skin sensation disturbance    Stroke 2011   rt sided weakness   Swelling of limb    TMJ (temporomandibular joint disorder)    Urinary tract infectious disease    Vertigo as late effect of stroke    Vitamin D deficiency     Past Surgical History:  Procedure Laterality Date   ANTERIOR CERVICAL DECOMP/DISCECTOMY FUSION N/A 09/09/2021   Procedure: Anterior  Cervical Decompression Fusion  Cervical four-five, Cervical five-six;  Surgeon: Bethann Goo, DO;  Location: [facility] OR;  Service: Neurosurgery;  Laterality: N/A;   BACK SURGERY     CARPAL TUNNEL RELEASE Left 09/09/2021   Procedure: LEFT CARPAL TUNNEL RELEASE;  Surgeon: Bethann Goo, DO;  Location: [facility] OR;  Service: Neurosurgery;  Laterality: Left;   CEREBRAL ANGIOGRAM     x3   COLONOSCOPY WITH PROPOFOL N/A 08/18/2016   Procedure: COLONOSCOPY WITH PROPOFOL;  Surgeon: Jeani Hawking, MD;  Location: [facility] END[facility]COPY;  Service: Endoscopy;  Laterality: N/A;   LAPAROSCOPIC HYSTERECTOMY     MANDIBLE FRACTURE SURGERY     RADIOLOGY WITH ANESTHESIA N/A 08/21/2013   Procedure: RADIOLOGY WITH ANESTHESIA;  Surgeon: Oneal Grout, MD;  Location: [facility] OR;  Service: Radiology;  Laterality: N/A;   stones     no surgery    Family Psychiatric History: Denies  Family History:  Family History  Problem Relation Age of Onset   Heart disease Mother    Heart disease Father    Heart attack Father    Cancer Father        prostate to lymph node   Breast cancer Maternal Aunt    Breast cancer Cousin    Breast cancer Cousin    Lung cancer Other     Social History:  Social History   Socioeconomic History  Marital status: Married    Spouse name: Windy Fast   Number of children: 2   Years of education: 14   Highest education level: Not on file  Occupational History   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    Drug use: No   Sexual activity: Not Currently  Other Topics Concern   Not on file  Social History Narrative   Patient lives at home with her husband Windy Fast).   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    Patient is on disability.    Patient has an Associates degree.   Patient is right-handed.   Patient drinks 2 cups of green tea most days.               Social Determinants of Health   Financial Resource Strain: Low Risk  (09/28/2022)   Overall Financial Resource Strain (CARDIA)    Difficulty of Paying Living Expenses: Not hard at all  Food Insecurity: No Food Insecurity (09/28/2022)   Hunger Vital Sign    Worried About Running Out of Food in the  Last Year: Never true    Ran Out of Food in the Last Year: Never true  Transportation Needs: No Transportation Needs (09/28/2022)   PRAPARE - Administrator, Civil Service (Medical): No    Lack of Transportation (Non-Medical): No  Physical Activity: Inactive (09/28/2022)   Exercise Vital Sign    Days of Exercise per Week: 0 days    Minutes of Exercise per Session: 0 min  Stress: Stress Concern Present (09/28/2022)   [employer] of Occupational Health - Occupational Stress Questionnaire    Feeling of Stress : To some extent  Social Connections: Not on file    Allergies:  Allergies  Allergen Reactions   Phenobarbital Hives, Shortness Of Breath and Swelling   Phenytoin Anaphylaxis, Hives and Swelling    Current Medications: Current Outpatient Medications  Medication Sig Dispense Refill   albuterol (PROVENTIL) (2.5 MG/3ML) 0.083% nebulizer solution Take 3 mLs (2.5 mg total) by nebulization every 6 (six) hours. 1080 mL 1   albuterol (VENTOLIN HFA) 108 (90 Base) MCG/ACT inhaler Inhale 2 puffs into the lungs every 6 (six) hours as needed for wheezing or shortness of breath. 24 g 3   aspirin EC 81 MG tablet Take 81 mg by mouth daily.     Biotin 1 MG CAPS Take 1 tablet by mouth 2 (two) times daily. Viviscal     Calcium-Magnesium-Zinc (CAL-MAG-ZINC PO) Take 1 tablet by mouth daily.     Cholecalciferol (VITAMIN D-3) 1000 units CAPS Take 2,000 Units by mouth at bedtime.     clopidogrel (PLAVIX) 75 MG tablet TAKE 1 TABLET DAILY 90 tablet 3   cyclobenzaprine (FLEXERIL) 5 MG tablet One tab po qpm prn 30 tablet 0  diclofenac Sodium (VOLTAREN) 1 % GEL Voltaren 1 % topical gel  APPLY 2 GRAM TO THE AFFECTED AREA(S) BY TOPICAL ROUTE 4 TIMES PER DAY 350 g 2   escitalopram (LEXAPRO) 20 MG tablet Take 1 tablet (20 mg total) by mouth at bedtime. 90 tablet 1   fluticasone (FLONASE) 50 MCG/ACT nasal spray Place 2 sprays into both nostrils daily as needed (allergies or post-nasl drip).  3    levETIRAcetam (KEPPRA) 500 MG tablet Take 1 tablet (500 mg total) by mouth every 12 (twelve) hours. 180 tablet 3   levothyroxine (SYNTHROID) 50 MCG tablet Take one tablet by mouth on Saturday and Sunday 39 tablet 3   levothyroxine (SYNTHROID) 75 MCG tablet TAKE 1 TABLET IN THE MORNING BEFORE BREAKFAST MONDAY THROUGH FRIDAY (TAKE 50 MCG ON OTHER DAYS) 51 tablet 4   loratadine (CLARITIN) 10 MG tablet Take 1 tablet (10 mg total) by mouth daily. 30 tablet 2   Multiple Vitamins-Minerals (PRESERVISION/LUTEIN PO) Take 1 tablet by mouth daily.     NONFORMULARY OR COMPOUNDED ITEM Apply 240 mg topically daily as needed. 1 each 3   Omega-3 Fatty Acids (FISH OIL) 1000 MG CAPS Take 2,000 mg by mouth 2 (two) times daily.      Polyethyl Glycol-Propyl Glycol (SYSTANE) 0.4-0.3 % SOLN Place 1 drop into both eyes daily as needed (dry eyes).     Psyllium (FIBER) 28.3 % POWD Take 10 mLs by mouth 2 (two) times daily.     rosuvastatin (CRESTOR) 20 MG tablet TAKE 1 TABLET DAILY (REPLACES 10 MG DOSE) 90 tablet 3   telmisartan (MICARDIS) 20 MG tablet Take 1 tablet (20 mg total) by mouth daily. 90 tablet 3   tiZANidine (ZANAFLEX) 4 MG tablet TAKE 1 TABLET EVERY EVENING AS NEEDED 14 tablet 0   traZODone (DESYREL) 50 MG tablet Take 1.5 tablets (75 mg total) by mouth at bedtime as needed for sleep. 90 tablet 1   vitamin C (ASCORBIC ACID) 500 MG tablet Take 1,000 mg by mouth at bedtime. Rose hips     No current facility-administered medications for this visit.     Musculoskeletal: Strength & Muscle Tone: decreased and right hemiparesis secondary to past stroke Gait & Station: unsteady Patient leans: Left  Psychiatric Specialty Exam: Review of Systems  Blood pressure (!) 164/84, pulse 84, height 5' 2.5" (1.588 m), weight 160 lb (72.6 kg).Body mass index is 28.8 kg/m.  General Appearance: Well Groomed  Eye Contact:  Good  Speech:  Clear and Coherent and Normal Rate  Volume:  Normal  Mood:  Euthymic  Affect:   Congruent  Thought Process:  Coherent and Goal Directed  Orientation:  Full (Time, Place, and Person)  Thought Content: Logical   Suicidal Thoughts:  No  Homicidal Thoughts:  No  Memory:  Immediate;   Fair  Judgement:  Good  Insight:  Good  Psychomotor Activity:  Normal  Concentration:  Concentration: Good  Recall:  Fair  Fund of Knowledge: Fair  Language: Good  Akathisia:  NA    AIMS (if indicated): not done  Assets:  Communication Skills Desire for Improvement Housing Intimacy Resilience Social Support Transportation Vocational/Educational  ADL's:  Intact  Cognition: WNL  Sleep:  Fair   Metabolic Disorder Labs: Lab Results  Component Value Date   HGBA1C 5.6 09/16/2020   No results found for: "PROLACTIN" Lab Results  Component Value Date   CHOL 118 05/18/2022   TRIG 110 05/18/2022   HDL 63 05/18/2022   CHOLHDL 1.9 05/18/2022  VLDL 15 11/23/2009   LDLCALC 35 05/18/2022   LDLCALC 52 07/26/2021   Lab Results  Component Value Date   TSH 1.500 09/28/2022   TSH 0.590 05/18/2022    Therapeutic Level Labs: No results found for: "LITHIUM" No results found for: "VALPROATE" No results found for: "CBMZ"   Screenings: GAD-7    Flowsheet Row [facility]  Total GAD-7 Score 16      Mini-Mental    Flowsheet Row Clinical Support from 09/03/2018 in [facility] Office Visit from 11/03/2015 in [facility] Office Visit from 05/05/2015 in [facility] Office Visit from 11/05/2014 in [facility]  Total Score (max 30 points ) 29 27 26 28       PHQ2-9    Flowsheet Row Office Visit from 11/21/2022 in [facility] Clinical Support from 09/28/2022 in [facility] Office Visit from 07/04/2022 [facility] Office Visit from 05/18/2022 in [facility] Office Visit from 11/15/2021 in [facility]  PHQ-2 Total Score 0 0 5 6 2   PHQ-9 Total Score 5 -- 19 17 7       Flowsheet Row ED from 08/09/2022 in [facility] [address] [facility]) Office Visit from 07[facility]04/2022 in [facility] Admission (Discharged) from 09/09/2021 in [facility]  C-SSRS RISK CATEGORY No Risk No Risk No Risk       Collaboration of Care: Collaboration of Care: Medication Management AEB medication prescription  Patient/Guardian was advised Release of Information must be obtained prior to any record release in order to collaborate their care with an outside provider. Patient/Guardian was advised if they have not already done so to contact the registration department to sign all necessary forms in order for [place] to release information regarding their care.   Consent: Patient/Guardian gives verbal consent for treatment and assignment of benefits for services provided during this visit. Patient/Guardian expressed understanding and agreed to proceed.    Stasia Cavalier, MD 12/28/2022, 9:23 AM

## 2022-12-29 ENCOUNTER — Other Ambulatory Visit (INDEPENDENT_AMBULATORY_CARE_PROVIDER_SITE_OTHER): Payer: Medicare Other

## 2022-12-29 DIAGNOSIS — R350 Frequency of micturition: Secondary | ICD-10-CM | POA: Diagnosis not present

## 2022-12-30 LAB — POCT URINALYSIS DIPSTICK
Bilirubin, UA: NEGATIVE
Blood, UA: NEGATIVE
Glucose, UA: NEGATIVE
Ketones, UA: NEGATIVE
Leukocytes, UA: NEGATIVE
Nitrite, UA: NEGATIVE
Protein, UA: NEGATIVE
Spec Grav, UA: 1.02 (ref 1.010–1.025)
Urobilinogen, UA: 0.2 E.U./dL
pH, UA: 5.5 (ref 5.0–8.0)

## 2023-01-11 ENCOUNTER — Other Ambulatory Visit: Payer: Self-pay | Admitting: Internal Medicine

## 2023-01-11 ENCOUNTER — Encounter: Payer: Self-pay | Admitting: Internal Medicine

## 2023-01-11 DIAGNOSIS — F4321 Adjustment disorder with depressed mood: Secondary | ICD-10-CM

## 2023-01-17 DIAGNOSIS — H353211 Exudative age-related macular degeneration, right eye, with active choroidal neovascularization: Secondary | ICD-10-CM | POA: Diagnosis not present

## 2023-01-22 ENCOUNTER — Other Ambulatory Visit: Payer: Self-pay | Admitting: Internal Medicine

## 2023-01-24 DIAGNOSIS — L570 Actinic keratosis: Secondary | ICD-10-CM | POA: Diagnosis not present

## 2023-01-24 DIAGNOSIS — D485 Neoplasm of uncertain behavior of skin: Secondary | ICD-10-CM | POA: Diagnosis not present

## 2023-01-24 DIAGNOSIS — L821 Other seborrheic keratosis: Secondary | ICD-10-CM | POA: Diagnosis not present

## 2023-02-02 ENCOUNTER — Ambulatory Visit (INDEPENDENT_AMBULATORY_CARE_PROVIDER_SITE_OTHER): Payer: Medicare Other | Admitting: Cardiology

## 2023-02-02 ENCOUNTER — Encounter (HOSPITAL_BASED_OUTPATIENT_CLINIC_OR_DEPARTMENT_OTHER): Payer: Self-pay | Admitting: Cardiology

## 2023-02-02 VITALS — BP 136/65 | HR 69 | Ht 62.5 in | Wt 152.8 lb

## 2023-02-02 DIAGNOSIS — E785 Hyperlipidemia, unspecified: Secondary | ICD-10-CM

## 2023-02-02 DIAGNOSIS — I10 Essential (primary) hypertension: Secondary | ICD-10-CM

## 2023-02-02 DIAGNOSIS — Z8673 Personal history of transient ischemic attack (TIA), and cerebral infarction without residual deficits: Secondary | ICD-10-CM | POA: Diagnosis not present

## 2023-02-02 DIAGNOSIS — I700 Atherosclerosis of aorta: Secondary | ICD-10-CM

## 2023-02-02 DIAGNOSIS — I2510 Atherosclerotic heart disease of native coronary artery without angina pectoris: Secondary | ICD-10-CM | POA: Diagnosis not present

## 2023-02-02 NOTE — Patient Instructions (Addendum)
Medication Instructions:   Your physician recommends that you continue on your current medications as directed. Please refer to the Current Medication list given to you today.  *If you need a refill on your cardiac medications before your next appointment, please call your pharmacy*  Lab Work: NONE ordered at this time of appointment   If you have labs (blood work) drawn today and your tests are completely normal, you will receive your results only by: MyChart Message (if you have MyChart) OR A paper copy in the mail If you have any lab test that is abnormal or we need to change your treatment, we will call you to review the results.  Testing/Procedures: NONE ordered at this time of appointment   Follow-Up: At [facility], you and your health needs are our priority.  As part of our continuing mission to provide you with exceptional heart care, we have created designated Provider Care Teams.  These Care Teams include your primary Cardiologist (physician) and Advanced Practice Providers (APPs -  Physician Assistants and Nurse Practitioners) who all work together to provide you with the care you need, when you need it.   Your next appointment:   1 year(s)  Provider:   Bridgette Christopher, MD    Other Instructions   

## 2023-02-02 NOTE — Progress Notes (Signed)
Cardiology Office Note   Date:  02/02/2023   ID:  [name], DOB Jan 30, 1956, MRN [id number]  PCP:  Dorothyann Peng, MD  Cardiologist:  Jodelle Red, MD  Referring MD: Dorothyann Peng, MD   CC: follow up  History of Present Illness:    [name] is a 67 y.o. female with a hx of brain aneurysm, aortic atherosclerosis, hypertension, hyperlipidemia, former tobacco use, COPD, prior ICH who is seen for follow up. I initially saw her 04/20/20 as a new consult at the request of Dorothyann Peng, MD for the evaluation and management of dyspnea on exertion.  Cardiovascular risk factors: Prior clinical ASCVD: ICH/CVA Comorbid conditions: endorses hypertension, hyperlipidemia. Denies diabetes, chronic kidney disease Tobacco use history: former, from age 38 until the day of her stroke in 2011. None since. Was 2 ppd at her peak. Family history: father has stents, has had open heart surgery. Has cancer now. Mother has a heart murmur. No heart surgeries that she knows of. Has a brother, has sleep apnea. No known heart issues. Prior cardiac testing and/or incidental findings on other testing (ie coronary calcium): echo 2015. CT noncon lung 04/19/19 showed aortic atherosclerosis and coronary calcification. On noncontrast CT, has prominent calcium in distal left main/proximal LAD.  At her last visit, she was overall feeling pretty good. She denied any recent issues with shooting pains in her LUE. She believed this was due to her carpal tunnel.   She endorsed being short of breath "a lot" but also attributes this to her COPD. She remained compliant with albuterol which helped her.  Her blood pressure was well controlled during this visit at 130/80. She did take her antihypertensives that morning.  At times she developed RLE edema such as after she stands up for too long.   Today, she reports her son recently passed, otherwise she is doing well.   She complains of a side pain  when stretching. She reports it at times radiates to there neck with jaw discomfort. She states it occasionally is a shooting pain. She is able to relieve the pain with movement. When her pain radiates to her jaw, movement does not help resolve the pain.   She reports her stool tends to be loose and dark in color. She states she takes fiber supplements.   She endorses right leg edema with occasional pitting. Today, there is no pitting present.   She denies any palpitations, chest pain, or shortness of breath. No lightheadedness, headaches, syncope, orthopnea, or PND.  Past Medical History:  Diagnosis Date   Abnormal gait    Amnesia    Anemia    hx   Aneurysm (HCC)    Anxiety    Asthma    Atrophic vaginitis    Benign hypertensive heart disease    Cardiomegaly    Chest pain    COPD (chronic obstructive pulmonary disease) (HCC)     - PFTs  01/24/06 FEV1 69% ratio 68% diffusing capacity 57% with no improvement after B2   - PFT's 10/1/ 09 FEV1 77   ratio  76    dlco                   45            no resp to B2    - Nl alpha one antitripsin level 10/09   Cough    Depression    Difficulty speaking    Disorder of nasal cavity    Dyspnea  Enthesopathy of elbow region    GERD (gastroesophageal reflux disease)    Hammer toe    Headache(784.0)    Hemiparesis affecting dominant side as late effect of cerebrovascular accident [facility])    History of kidney stones    Hyperlipidemia    Hypertension    Hypothyroidism    Increased urinary frequency    Low compliance bladder    Macular degeneration of both eyes    Malaise and fatigue    Nonruptured cerebral aneurysm    Obesity    Pneumonia    hx   Pure hypercholesterolemia    Recurrent major depressive episodes (HCC)    Seizure disorder, secondary (HCC)    2-3 focal siezures monthly   Seizures (HCC)    Short-term memory loss    since stroke   Skin sensation disturbance    Stroke [facility]) 2011   rt sided weakness   Swelling of limb     TMJ (temporomandibular joint disorder)    Urinary tract infectious disease    Vertigo as late effect of stroke    Vitamin D deficiency     Past Surgical History:  Procedure Laterality Date   ANTERIOR CERVICAL DECOMP/DISCECTOMY FUSION N/A 09/09/2021   Procedure: Anterior  Cervical Decompression Fusion  Cervical four-five, Cervical five-six;  Surgeon: Bethann Goo, DO;  Location: [facility] OR;  Service: Neurosurgery;  Laterality: N/A;   BACK SURGERY     CARPAL TUNNEL RELEASE Left 09/09/2021   Procedure: LEFT CARPAL TUNNEL RELEASE;  Surgeon: Bethann Goo, DO;  Location: [facility] OR;  Service: Neurosurgery;  Laterality: Left;   CEREBRAL ANGIOGRAM     x3   COLONOSCOPY WITH PROPOFOL N/A 08/18/2016   Procedure: COLONOSCOPY WITH PROPOFOL;  Surgeon: Jeani Hawking, MD;  Location: [facility] ENDOSCOPY;  Service: Endoscopy;  Laterality: N/A;   LAPAROSCOPIC HYSTERECTOMY     MANDIBLE FRACTURE SURGERY     RADIOLOGY WITH ANESTHESIA N/A 08/21/2013   Procedure: RADIOLOGY WITH ANESTHESIA;  Surgeon: Oneal Grout, MD;  Location: [facility] OR;  Service: Radiology;  Laterality: N/A;   stones     no surgery    Current Medications: Current Outpatient Medications on File Prior to Visit  Medication Sig   albuterol (PROVENTIL) (2.5 MG/3ML) 0.083% nebulizer solution Take 3 mLs (2.5 mg total) by nebulization every 6 (six) hours.   albuterol (VENTOLIN HFA) 108 (90 Base) MCG/ACT inhaler Inhale 2 puffs into the lungs every 6 (six) hours as needed for wheezing or shortness of breath.   aspirin EC 81 MG tablet Take 81 mg by mouth daily.   Biotin 1 MG CAPS Take 1 tablet by mouth 2 (two) times daily. Viviscal   Calcium-Magnesium-Zinc (CAL-MAG-ZINC PO) Take 1 tablet by mouth daily.   Cholecalciferol (VITAMIN D-3) 1000 units CAPS Take 2,000 Units by mouth at bedtime.   clopidogrel (PLAVIX) 75 MG tablet TAKE 1 TABLET DAILY   diclofenac Sodium (VOLTAREN) 1 % GEL Voltaren 1 % topical gel  APPLY 2 GRAM TO THE AFFECTED AREA(S) BY TOPICAL ROUTE 4  TIMES PER DAY   escitalopram (LEXAPRO) 20 MG tablet Take 1 tablet (20 mg total) by mouth at bedtime.   fluticasone (FLONASE) 50 MCG/ACT nasal spray Place 2 sprays into both nostrils daily as needed (allergies or post-nasl drip).   levETIRAcetam (KEPPRA) 500 MG tablet Take 1 tablet (500 mg total) by mouth every 12 (twelve) hours.   levothyroxine (SYNTHROID) 50 MCG tablet Take one tablet by mouth on Saturday and Sunday   levothyroxine (SYNTHROID) 75 MCG  tablet TAKE 1 TABLET IN THE MORNING BEFORE BREAKFAST MONDAY THROUGH FRIDAY (TAKE 50 MCG ON OTHER DAYS)   loratadine (CLARITIN) 10 MG tablet Take 1 tablet (10 mg total) by mouth daily.   Multiple Vitamins-Minerals (PRESERVISION/LUTEIN PO) Take 1 tablet by mouth daily.   NONFORMULARY OR COMPOUNDED ITEM Apply 240 mg topically daily as needed.   Omega-3 Fatty Acids (FISH OIL) 1000 MG CAPS Take 2,000 mg by mouth 2 (two) times daily.    Polyethyl Glycol-Propyl Glycol (SYSTANE) 0.4-0.3 % SOLN Place 1 drop into both eyes daily as needed (dry eyes).   Psyllium (FIBER) 28.3 % POWD Take 10 mLs by mouth 2 (two) times daily.   rosuvastatin (CRESTOR) 20 MG tablet TAKE 1 TABLET DAILY (REPLACES 10 MG DOSE)   telmisartan (MICARDIS) 20 MG tablet Take 1 tablet (20 mg total) by mouth daily.   tiZANidine (ZANAFLEX) 4 MG tablet TAKE 1 TABLET EVERY EVENING AS NEEDED   traZODone (DESYREL) 50 MG tablet Take 1.5 tablets (75 mg total) by mouth at bedtime as needed for sleep.   vitamin C (ASCORBIC ACID) 500 MG tablet Take 1,000 mg by mouth at bedtime. Rose hips   No current facility-administered medications on file prior to visit.     Allergies:   Phenobarbital and Phenytoin   Social History   Tobacco Use   Smoking status: Former    Packs/day: 2.00    Years: 30.00    Additional pack years: 0.00    Total pack years: 60.00    Types: Cigarettes    Quit date: 11/27/2009    Years since quitting: 13.1   Smokeless tobacco: Never   Tobacco comments:    smoked 2.5 ppd x  5 years, 1ppd x 20+ years  Vaping Use   Vaping Use: Never used  Substance Use Topics   Alcohol use: No    Alcohol/week: 0.0 standard drinks of alcohol    Comment: patient quit alcohol use 2005.    Drug use: No    Family History: family history includes Breast cancer in her cousin, cousin, and maternal aunt; Cancer in her father; Heart attack in her father; Heart disease in her father and mother; Lung cancer in an other family member.  ROS:   Please see the history of present illness.   (+) RLE edema (+) Myalgias (when stretching) Additional pertinent ROS otherwise unremarkable.  EKGs/Labs/Other Studies Reviewed:    The following studies were reviewed today:  LE Doppler 03/28/2022: Summary:  Right: Resting right ankle-brachial index is within normal range. No  evidence of significant right lower extremity arterial disease. The right  toe-brachial index is normal.   Left: Resting left ankle-brachial index is within normal range. No  evidence of significant left lower extremity arterial disease. The left  toe-brachial index is normal.   Cardiac CTA 04/01/2021: FINDINGS: A 100 kV prospective scan was triggered in the descending thoracic aorta at 111 HU's. Axial non-contrast 3 mm slices were carried out through the heart. The data set was analyzed on a dedicated work station and scored using the Agatson method. Gantry rotation speed was 250 msecs and collimation was .6 mm. No beta blockade and 0.8 mg of sl NTG was given. The 3D data set was reconstructed in 5% intervals of the 67-82 % of the R-R cycle. Diastolic phases were analyzed on a dedicated work station using MPR, MIP and VRT modes. The patient received 100 cc of contrast.   Aorta:  Normal size.  Aortic atherosclerosis.  No dissection.  Main Pulmonary Artery: Normal size of the pulmonary artery.   Aortic Valve:  Tri-leaflet.  No calcifications.   Coronary Arteries:  Normal coronary origin. Right dominance.    Coronary Calcium Score:   Left main: 116   Left anterior descending artery: 390   Left circumflex artery: 19   Right coronary artery: 243   Total: 767   Percentile: 98th for age, sex, and race matched control.   RCA is a large dominant artery that gives rise to PDA and PLA. There a minimal non-obstructive calcified plaque in the proximal RCA. There a minimal non-obstructive calcified plaque in the mid RCA. There a minimal non-obstructive calcified plaque in the distal RCA   Left main is a large artery that gives rise to LAD and LCX arteries. There is a < 10% plaque in the proximal and distal left main.   LAD is a large vessel that gives rise to four diagonal vessels. There is a 16 mm length of mild non-obstructive calcified plaque in the proximal LAD. There is a mild non-obstructive calcified plaque at the ostium of the D2 vessel.   LCX is a non-dominant artery that gives rise to one large OM1 branch. There is mild non-obstructive calcified plaque in the proximal OM1.   Other findings:   Normal pulmonary vein drainage into the left atrium.   Normal left atrial appendage without a thrombus.   Cannot exclude small PFO.   Extra-cardiac findings: See attached radiology report for non-cardiac structures.   IMPRESSION: 1. Coronary calcium score of 767. This was 98th percentile for age, sex, and race matched control.   2. Normal coronary origin with right dominance.   3. CAD-RADS 2. Mild non-obstructive CAD (25-49%). Consider non-atherosclerotic causes of chest pain. Consider preventive therapy and risk factor modification.   4. Aortic atherosclerosis.  Lexiscan myoview 04/29/20 The left ventricular ejection fraction is hyperdynamic (>65%). Nuclear stress EF: 73%. The TID is increased at 1.34 but visually there does not appear to be transient ischemic dilatation. There was no ST segment deviation noted during stress. The perfusion study is normal. This is a low  risk study.  CT angio chest/abdomen 02/14/14 IMPRESSION:  1. No aortic dissection.  No aneurysm.  2. No acute findings in the chest abdomen.  3. No abdominal mass.   Echo 01/30/14 Left ventricle: The cavity size was normal. Wall thickness was    normal. Systolic function was normal. The estimated ejection    fraction was in the range of 60% to 65%. Wall motion was normal;    there were no regional wall motion abnormalities. Doppler    parameters are consistent with abnormal left ventricular    relaxation (grade 1 diastolic dysfunction). The E/e&' ratio is <8,    suggesting normal LV filling pressure.  - Left atrium: The atrium was normal in size.  - Pericardium, extracardiac: There is a heterogenous mass inferior    to the RV which extends toward the apex and compresses the RV -    this is similar in appearance to liver and may represent    hepatomegaly or perhaps mass - consider dedicated    abdominal/thoracic imaging. There is the appearance of possible    mass invading the IVC in image 71. There was no pericardial    effusion.   Impressions:  - No evidence for cardiomegaly. Heterogenous mass comressing the RV    and possibly invading the IVC seen in subcostal images. Recommend    dedicated thoracoabdominal imaging.   EKG:  EKG is personally reviewed.   02/02/2023: NSR at 69 bpm 11/08/2021: NSR at 99 bpm 05/05/2021: not ordered  10/29/2020: EKG was not ordered. 04/20/20: NSR at 87 bpm  Recent Labs: 05/18/2022: Hemoglobin 13.7; Platelets 205 09/28/2022: TSH 1.500 11/21/2022: ALT 19; BUN 15; Creatinine, Ser 0.63; Potassium 4.5; Sodium 141   Recent Lipid Panel    Component Value Date/Time   CHOL 118 05/18/2022 0938   TRIG 110 05/18/2022 0938   HDL 63 05/18/2022 0938   CHOLHDL 1.9 05/18/2022 0938   CHOLHDL 2.8 11/23/2009 0435   VLDL 15 11/23/2009 0435   LDLCALC 35 05/18/2022 0938    Physical Exam:    VS:  BP 136/65   Pulse 69   Ht 5' 2.5" (1.588 m)   Wt 152 lb 12.8 oz  (69.3 kg)   SpO2 95%   BMI 27.50 kg/m     Wt Readings from Last 3 Encounters:  02/02/23 152 lb 12.8 oz (69.3 kg)  12/26/22 160 lb (72.6 kg)  11/21/22 162 lb 3.2 oz (73.6 kg)    GEN: Well nourished, well developed in no acute distress HEENT: Normal, moist mucous membranes NECK: No JVD CARDIAC: regular rhythm, normal S1 and S2, no rubs or gallops. No murmur. VASCULAR: Radial and DP pulses 2+ bilaterally. No carotid bruits RESPIRATORY:  Clear to auscultation without rales, wheezing or rhonchi  ABDOMEN: Soft, non-tender, non-distended MUSCULOSKELETAL:  Ambulates independently SKIN: Warm and dry, no edema NEUROLOGIC:  Alert and oriented x 3. No focal neuro deficits noted. PSYCHIATRIC:  Normal affect     ASSESSMENT:    1. Essential hypertension   2. History of CVA (cerebrovascular accident)   3. Nonocclusive coronary atherosclerosis of native coronary artery   4. Hyperlipidemia LDL goal <70   5. Aortic atherosclerosis (HCC)    PLAN:    History of CVA, with history of aneurysms Hypertension -continue telmisartan  Nonobstructive CAD Coronary Calcium Aortic atherosclerosis Hyperlipidemia -secondary prevention discussed -continue aspirin, clopidogrel per neurology -continue rosuvastatin 20 mg daily -LDL goal <70, last 35 -has had atypical chest pain. Reviewed red flag warning signs that need immediate medical attention  Cardiac risk counseling and prevention recommendations: with family history of CV disease -recommend heart healthy/Mediterranean diet, with whole grains, fruits, vegetable, fish, lean meats, nuts, and olive oil. Limit salt. -recommend moderate walking, 3-5 times/week for 30-50 minutes each session. Aim for at least 150 minutes.week. Goal should be pace of 3 miles/hours, or walking 1.5 miles in 30 minutes -recommend avoidance of tobacco products. Avoid excess alcohol.  Plan for follow up: 1 year ,or sooner as needed  Jodelle Red, MD, PhD [facility]    I,Rachel Rivera,acting as a scribe for Jodelle Red, MD.,have documented all relevant documentation on the behalf of Jodelle Red, MD,as directed by  Jodelle Red, MD while in the presence of Jodelle Red, MD.  I, Jodelle Red, MD, have reviewed all documentation for this visit. The documentation on 03/11/23 for the exam, diagnosis, procedures, and orders are all accurate and complete.   Signed, Jodelle Red, MD PhD 02/02/2023  [facility]

## 2023-02-15 ENCOUNTER — Encounter (HOSPITAL_COMMUNITY): Payer: Self-pay | Admitting: Psychiatry

## 2023-02-15 ENCOUNTER — Ambulatory Visit (HOSPITAL_BASED_OUTPATIENT_CLINIC_OR_DEPARTMENT_OTHER): Payer: Medicare Other | Admitting: Psychiatry

## 2023-02-15 VITALS — BP 155/80 | HR 71 | Ht 62.0 in | Wt 154.0 lb

## 2023-02-15 DIAGNOSIS — F411 Generalized anxiety disorder: Secondary | ICD-10-CM | POA: Diagnosis not present

## 2023-02-15 DIAGNOSIS — F331 Major depressive disorder, recurrent, moderate: Secondary | ICD-10-CM

## 2023-02-15 MED ORDER — TRAZODONE HCL 50 MG PO TABS: 75.0000 mg | ORAL_TABLET | Freq: Every evening | ORAL | 1 refills | Status: DC | PRN

## 2023-02-15 MED ORDER — CLONAZEPAM 0.5 MG PO TABS: 0.2500 mg | ORAL_TABLET | Freq: Every day | ORAL | 1 refills | Status: DC | PRN

## 2023-02-15 NOTE — Progress Notes (Signed)
BH MD/PA/NP OP Progress Note  02/15/2023 12:34 PM CATINA [name]  MRN:  [id number]  Visit Diagnosis:    ICD-10-CM   1. GAD (generalized anxiety disorder)  F41.1 traZODone (DESYREL) 50 MG tablet    clonazePAM (KLONOPIN) 0.5 MG tablet    2. Moderate episode of recurrent major depressive disorder (HCC)  F33.1 traZODone (DESYREL) 50 MG tablet    clonazePAM (KLONOPIN) 0.5 MG tablet      Assessment: [name] is a 67 y.o. 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    During initial evaluation patient reported symptoms of depression including anhedonia, feelings of hopelessness, increased fatigue, decreased appetite, difficulty sleeping, difficulty concentrating, and feelings of worthlessness.  She denied any SI/HI or thoughts of self-harm along with any past history of mania or psychosis.  Patient also endorsed symptoms of anxiety including feeling nervous or on edge, being unable to control her worrying, worrying too much with different things, difficulty relaxing, and irritability.  Of note patient had a left frontoparietal parenchymal hemorrhage in March 2011 which correlated with the onset of her cognitive impairment, onset of seizures, and onset of depressive symptoms.  Patient was started on Keppra around that time and we discussed the possibility that it could be contributing to her increased irritability/agitation.    [name] presents for follow-up evaluation. Today, 02/15/23, patient reports an increase in depression and anxiety over the past 6 weeks.  This is secondary to the loss of her son to suicide.  Patient is experiencing grief response with some survivor's guilt.  She endorses irritability, ruminations, and preoccupations on past events and thinks she could have done differently to  change the outcome.  Patient has been connected with grief counseling and denies any suicidal ideation.  Patient was unable to tolerate trazodone 100 due to being over sedating.  We will change trazodone to 75 mg at bedtime as needed today.  We will also start Klonopin 0.25 mg daily as needed for anxiety.  Risk and benefits of this medication were discussed and we will plan for her to be a short course.  Patient will continue with grief counseling and we can consider therapy in the future if needed.  Plan: - Continue Lexapro (escitalopram) 20 mg at bedtime  - Decrease trazodone to 75 mg at bedtime as needed - Start Klonopin 0.25 QD prn for anxiety - EKG reviewed QTC 462 - Discuss Keppra with neurologist - Continue Grief counseling - CBC, CMP, lipid profile, Vit D, and TSH reviewed - Follow up in 6 weeks  Chief Complaint:  Chief Complaint  Patient presents with   Follow-up   HPI: [name] presents reporting that things have been really difficult for her the past month and a half.  Her son, [name], committed suicide via firearm on April 25 leaving a note.  While [name] has been able to see some signs in hindsight she notes that at the time they had no indication that he was depressed.  Patient endorses significant survivor's guilt and has been thinking a lot about the past and things he could have missed or would have changed to prevent this.  Logically she knows that this is not her fault and that there is no guarantee different actions would change the outcome.  She has connected with grief counseling and she has been seeing this since the event.  In addition to the rumination on the events patient also has some irritability towards her daughter and husband.  And she knows this is not rational however her son had sent her daughter a suicide note a few years ago that [name] was never informed about.  As for her husband she notes that at times he may benefit rougher with [name] growing up, though Esm… mentioned  that she allowed it, which would make this her fault.  Patient reports that she has been struggling with increased irritability, ruminations, tremors, and anxiety during the days.  At night she has found it difficult to sleep since the incident.  She did try taking trazodone 100 mg but found it to be over sedating when she had to get up to use the bathroom.  We suggested trying trazodone 75 mg instead.  In addition we also discussed starting an as needed medication for anxiety short-term during this time.  Patient had been on Xanax in the past and we discussed starting Klonopin at an equivalent dose.  Risk and benefits of this medication were reviewed.  Patient was encouraged to continue with grief counseling and we can consider therapy referral if needed in the future.  Past Psychiatric History: Patient reports that her psychiatric symptoms/depression started after 2011 when she had her stroke prior to that she reports minimal to no depressive symptoms.  She has been on Celexa for as long as she can remember post stroke and believes she was started on Xanax at the same time.  Patient denies any prior psychiatric hospitalizations or past suicide attempts.  She denies any current substance use since 2005 and reports drinking alcohol when she was younger.   Past Medical History:  Past Medical History:  Diagnosis Date   Abnormal gait    Amnesia    Anemia    hx   Aneurysm (HCC)    Anxiety    Asthma    Atrophic vaginitis    Benign hypertensive heart disease    Cardiomegaly    Chest pain    COPD (chronic obstructive pulmonary disease) (HCC)     - PFTs  01/24/06 FEV1 69% ratio 68% diffusing capacity 57% with no improvement after B2   - PFT's 10/1/ 09 FEV1 77   ratio  76    dlco                   45            no resp to B2    - Nl alpha one antitripsin level 10/09   Cough    Depression    Difficulty speaking    Disorder of nasal cavity    Dyspnea    Enthesopathy of elbow region    GERD  (gastroesophageal reflux disease)    Hammer toe    Headache(784.0)    Hemiparesis affecting dominant side as late effect of cerebrovascular accident (HCC)    History of kidney stones    Hyperlipidemia    Hypertension    Hypothyroidism    Increased urinary frequency    Low compliance bladder    Macular degeneration of both eyes    Malaise and fatigue    Nonruptured cerebral aneurysm    Obesity    Pneumonia    hx   Pure hypercholesterolemia    Recurrent major depressive episodes (HCC)    Seizure disorder, secondary (HCC)    2-3 focal siezures monthly   Seizures (HCC)    Short-term memory loss  since stroke   Skin sensation disturbance    Stroke [facility]) 2011   rt sided weakness   Swelling of limb    TMJ (temporomandibular joint disorder)    Urinary tract infectious disease    Vertigo as late effect of stroke    Vitamin D deficiency     Past Surgical History:  Procedure Laterality Date   ANTERIOR CERVICAL DECOMP/DISCECTOMY FUSION N/A 09/09/2021   Procedure: Anterior  Cervical Decompression Fusion  Cervical four-five, Cervical five-six;  Surgeon: Bethann Goo, DO;  Location: [facility] OR;  Service: Neurosurgery;  Laterality: N/A;   BACK SURGERY     CARPAL TUNNEL RELEASE Left 09/09/2021   Procedure: LEFT CARPAL TUNNEL RELEASE;  Surgeon: Bethann Goo, DO;  Location: [facility] OR;  Service: Neurosurgery;  Laterality: Left;   CEREBRAL ANGIOGRAM     x3   COLONOSCOPY WITH PROPOFOL N/A 08/18/2016   Procedure: COLONOSCOPY WITH PROPOFOL;  Surgeon: Jeani Hawking, MD;  Location: [facility] ENDOSCOPY;  Service: Endoscopy;  Laterality: N/A;   LAPAROSCOPIC HYSTERECTOMY     MANDIBLE FRACTURE SURGERY     RADIOLOGY WITH ANESTHESIA N/A 08/21/2013   Procedure: RADIOLOGY WITH ANESTHESIA;  Surgeon: Oneal Grout, MD;  Location: [facility] OR;  Service: Radiology;  Laterality: N/A;   stones     no surgery    Family Psychiatric History: Denies  Family History:  Family History  Problem Relation Age of Onset    Heart disease Mother    Heart disease Father    Heart attack Father    Cancer Father        prostate to lymph node   Breast cancer Maternal Aunt    Breast cancer Cousin    Breast cancer Cousin    Lung cancer Other     Social History:  Social History   Socioeconomic History   Marital status: Married    Spouse name: Windy Fast   Number of children: 2   Years of education: 14   Highest education level: Not on file  Occupational History   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    Drug use: No   Sexual activity: Not Currently  Other Topics Concern   Not on file  Social History Narrative   Patient lives at home with her husband Windy Fast).   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    Patient is on disability.    Patient has an Associates degree.   Patient is right-handed.   Patient drinks 2 cups of green tea most days.               Social Determinants of Health   Financial Resource Strain: Low Risk  (09/28/2022)   Overall Financial Resource Strain (CARDIA)    Difficulty of Paying Living Expenses: Not hard at all  Food Insecurity: No Food Insecurity (09/28/2022)   Hunger Vital Sign    Worried About Running Out of Food in the Last Year: Never true    Ran Out of Food in the Last Year: Never true  Transportation Needs: No Transportation Needs (09/28/2022)   PRAPARE - Administrator, Civil Service (Medical): No    Lack of  Transportation (Non-Medical): No  Physical Activity: Inactive (09/28/2022)   Exercise Vital Sign    Days of Exercise per Week: 0 days    Minutes of Exercise per Session: 0 min  Stress: Stress Concern Present (09/28/2022)   [employer] of Occupational Health - Occupational Stress Questionnaire    Feeling of Stress : To some extent  Social Connections: Not on file    Allergies:  Allergies  Allergen Reactions   Phenobarbital Hives, Shortness Of Breath and Swelling   Phenytoin Anaphylaxis, Hives and Swelling    Current Medications: Current Outpatient Medications  Medication Sig Dispense Refill   clonazePAM (KLONOPIN) 0.5 MG tablet Take 0.5 tablets (0.25 mg total) by mouth daily as needed for anxiety. 15 tablet 1   albuterol (PROVENTIL) (2.5 MG/3ML) 0.083% nebulizer solution Take 3 mLs (2.5 mg total) by nebulization every 6 (six) hours. 1080 mL 1   albuterol (VENTOLIN HFA) 108 (90 Base) MCG/ACT inhaler Inhale 2 puffs into the lungs every 6 (six) hours as needed for wheezing or shortness of breath. 24 g 3   aspirin EC 81 MG tablet Take 81 mg by mouth daily.     Biotin 1 MG CAPS Take 1 tablet by mouth 2 (two) times daily. Viviscal     Calcium-Magnesium-Zinc (CAL-MAG-ZINC PO) Take 1 tablet by mouth daily.     Cholecalciferol (VITAMIN D-3) 1000 units CAPS Take 2,000 Units by mouth at bedtime.     clopidogrel (PLAVIX) 75 MG tablet TAKE 1 TABLET DAILY 90 tablet 3   diclofenac Sodium (VOLTAREN) 1 % GEL Voltaren 1 % topical gel  APPLY 2 GRAM TO THE AFFECTED AREA(S) BY TOPICAL ROUTE 4 TIMES PER DAY 350 g 2   escitalopram (LEXAPRO) 20 MG tablet Take 1 tablet (20 mg total) by mouth at bedtime. 90 tablet 1   fluticasone (FLONASE) 50 MCG/ACT nasal spray Place 2 sprays into both nostrils daily as needed (allergies or post-nasl drip).  3   levETIRAcetam (KEPPRA) 500 MG tablet Take 1 tablet (500 mg total) by mouth every 12 (twelve) hours. 180 tablet 3   levothyroxine (SYNTHROID) 50 MCG tablet Take  one tablet by mouth on Saturday and Sunday 39 tablet 3   levothyroxine (SYNTHROID) 75 MCG tablet TAKE 1 TABLET IN THE MORNING BEFORE BREAKFAST MONDAY THROUGH FRIDAY (TAKE 50 MCG ON OTHER DAYS) 51 tablet 4   loratadine (CLARITIN) 10 MG tablet Take 1 tablet (10 mg total) by mouth daily. 30 tablet 2   Multiple Vitamins-Minerals (PRESERVISION/LUTEIN PO) Take 1 tablet by mouth daily.     NONFORMULARY OR COMPOUNDED ITEM Apply 240 mg topically daily as needed. 1 each 3   Omega-3 Fatty Acids (FISH OIL) 1000 MG CAPS Take 2,000 mg by mouth 2 (two) times daily.      Polyethyl Glycol-Propyl Glycol (SYSTANE) 0.4-0.3 % SOLN Place 1 drop into both eyes daily as needed (dry eyes).     Psyllium (FIBER) 28.3 % POWD Take 10 mLs by mouth 2 (two) times daily.     rosuvastatin (CRESTOR) 20 MG tablet TAKE 1 TABLET DAILY (REPLACES 10 MG DOSE) 90 tablet 3   telmisartan (MICARDIS)  20 MG tablet Take 1 tablet (20 mg total) by mouth daily. 90 tablet 3   tiZANidine (ZANAFLEX) 4 MG tablet TAKE 1 TABLET EVERY EVENING AS NEEDED 60 tablet 1   traZODone (DESYREL) 50 MG tablet Take 1.5 tablets (75 mg total) by mouth at bedtime as needed for sleep. 90 tablet 1   vitamin C (ASCORBIC ACID) 500 MG tablet Take 1,000 mg by mouth at bedtime. Rose hips     No current facility-administered medications for this visit.     Musculoskeletal: Strength & Muscle Tone: decreased and right hemiparesis secondary to past stroke Gait & Station: unsteady Patient leans: Left  Psychiatric Specialty Exam: Review of Systems  Blood pressure (!) 155/80, pulse 71, height 5\' 2"  (1.575 m), weight 154 lb (69.9 kg), SpO2 97 %.Body mass index is 28.17 kg/m.  General Appearance: Well Groomed  Eye Contact:  Good  Speech:  Clear and Coherent and Normal Rate  Volume:  Normal  Mood:  Anxious and Depressed  Affect:  Congruent and Tearful  Thought Process:  Coherent  Orientation:  Full (Time, Place, and Person)  Thought Content: Logical, Illogical,  Rumination, and Abstract Reasoning   Suicidal Thoughts:  No  Homicidal Thoughts:  No  Memory:  Immediate;   Fair  Judgement:  Good  Insight:  Good  Psychomotor Activity:  Normal  Concentration:  Concentration: Good  Recall:  Fair  Fund of Knowledge: Fair  Language: Good  Akathisia:  NA    AIMS (if indicated): not done  Assets:  Communication Skills Desire for Improvement Housing Intimacy Resilience Social Support Transportation Vocational/Educational  ADL's:  Intact  Cognition: WNL  Sleep:  Fair   Metabolic Disorder Labs: Lab Results  Component Value Date   HGBA1C 5.6 09/16/2020   No results found for: "PROLACTIN" Lab Results  Component Value Date   CHOL 118 05/18/2022   TRIG 110 05/18/2022   HDL 63 05/18/2022   CHOLHDL 1.9 05/18/2022   VLDL 15 11/23/2009   LDLCALC 35 05/18/2022   LDLCALC 52 07/26/2021   Lab Results  Component Value Date   TSH 1.500 09/28/2022   TSH 0.590 05/18/2022    Therapeutic Level Labs: No results found for: "LITHIUM" No results found for: "VALPROATE" No results found for: "CBMZ"   Screenings: GAD-7    Flowsheet Row [facility]04/20[facility]  Total GAD-7 Score 16      Mini-Mental    Flowsheet Row Clinical Support from 09/03/2018 in [facility] Office Visit from 11/03/2015 in [facility] Office Visit from 05/05/2015 in [facility] Office Visit from 11/05/2014 in [facility]  Total Score (max 30 points ) 29 27 26 28       PHQ2-9    Flowsheet Row Office Visit from 11/21/2022 in [facility] Clinical Support from 09/28/2022 in [facility] Office Visit from 07/04/2022 in [facility] Office Visit from 05/18/2022 in [facility] Office Visit from 11/15/2021 in [facility]  PHQ-2 Total Score 0 0 5 6 2   PHQ-9 Total Score 5 -- 19 17 7       Flowsheet Row ED from 08/09/2022 in [facility] Office Visit from 07/04/2022 in [facility] Admission (Discharged) from 09/09/2021 in [facility]  [facility]C SPINE  CENTER  C-SSRS RISK CATEGORY No Risk No Risk No Risk       Collaboration of Care: Collaboration of Care: Medication Management AEB medication prescription  Patient/Guardian was advised Release of Information must be obtained prior to any record release in order to collaborate their care with an outside provider. Patient/Guardian was advised if they have not already done so to contact the registration department to sign all necessary forms in order for [place] to release information regarding their care.   Consent: Patient/Guardian gives verbal consent for treatment and assignment of benefits for services provided during this visit. Patient/Guardian expressed understanding and agreed to proceed.    Stasia Cavalier, MD 02/15/2023, 12:34 PM

## 2023-03-08 DIAGNOSIS — Z79899 Other long term (current) drug therapy: Secondary | ICD-10-CM | POA: Diagnosis not present

## 2023-03-08 DIAGNOSIS — H2513 Age-related nuclear cataract, bilateral: Secondary | ICD-10-CM | POA: Diagnosis not present

## 2023-03-08 DIAGNOSIS — H52223 Regular astigmatism, bilateral: Secondary | ICD-10-CM | POA: Diagnosis not present

## 2023-03-08 DIAGNOSIS — H524 Presbyopia: Secondary | ICD-10-CM | POA: Diagnosis not present

## 2023-03-08 DIAGNOSIS — H353211 Exudative age-related macular degeneration, right eye, with active choroidal neovascularization: Secondary | ICD-10-CM | POA: Diagnosis not present

## 2023-03-08 DIAGNOSIS — H353122 Nonexudative age-related macular degeneration, left eye, intermediate dry stage: Secondary | ICD-10-CM | POA: Diagnosis not present

## 2023-03-11 ENCOUNTER — Encounter (HOSPITAL_BASED_OUTPATIENT_CLINIC_OR_DEPARTMENT_OTHER): Payer: Self-pay | Admitting: Cardiology

## 2023-03-23 ENCOUNTER — Ambulatory Visit (INDEPENDENT_AMBULATORY_CARE_PROVIDER_SITE_OTHER): Payer: Medicare Other | Admitting: Internal Medicine

## 2023-03-23 ENCOUNTER — Encounter: Payer: Self-pay | Admitting: Internal Medicine

## 2023-03-23 ENCOUNTER — Ambulatory Visit: Payer: Medicare Other | Admitting: Internal Medicine

## 2023-03-23 VITALS — BP 110/82 | HR 75 | Temp 98.2°F | Ht 62.0 in | Wt 156.2 lb

## 2023-03-23 DIAGNOSIS — G8110 Spastic hemiplegia affecting unspecified side: Secondary | ICD-10-CM

## 2023-03-23 DIAGNOSIS — I700 Atherosclerosis of aorta: Secondary | ICD-10-CM

## 2023-03-23 DIAGNOSIS — Z23 Encounter for immunization: Secondary | ICD-10-CM | POA: Diagnosis not present

## 2023-03-23 DIAGNOSIS — E2839 Other primary ovarian failure: Secondary | ICD-10-CM | POA: Diagnosis not present

## 2023-03-23 DIAGNOSIS — G959 Disease of spinal cord, unspecified: Secondary | ICD-10-CM

## 2023-03-23 DIAGNOSIS — I119 Hypertensive heart disease without heart failure: Secondary | ICD-10-CM | POA: Diagnosis not present

## 2023-03-23 DIAGNOSIS — R7309 Other abnormal glucose: Secondary | ICD-10-CM

## 2023-03-23 DIAGNOSIS — N3941 Urge incontinence: Secondary | ICD-10-CM

## 2023-03-23 DIAGNOSIS — E039 Hypothyroidism, unspecified: Secondary | ICD-10-CM | POA: Diagnosis not present

## 2023-03-23 DIAGNOSIS — G8111 Spastic hemiplegia affecting right dominant side: Secondary | ICD-10-CM

## 2023-03-23 DIAGNOSIS — R202 Paresthesia of skin: Secondary | ICD-10-CM

## 2023-03-23 NOTE — Progress Notes (Unsigned)
I,Jameka J Llittleton, CMA,acting as a Neurosurgeon for Gwynneth Aliment, MD.,have documented all relevant documentation on the behalf of Gwynneth Aliment, MD,as directed by  Gwynneth Aliment, MD while in the presence of Gwynneth Aliment, MD.  Subjective:  Patient ID: [name] , female    DOB: [birth year] , 67 y.o.   MRN: [id number]  Chief Complaint  Patient presents with  . Hypertension  . Hypothyroidism    HPI  She presents today for BP and chol check.  She reports compliance with meds.  She reports compliance with medication daily.  She denies having any dizziness, chest pain.  She admits recently experiencing headaches. She reports this has happened mainly within the last week. She states it does not last the whole day.  She also reports having a constant cough. She reports she will reach out to her lung doctor to have test ran.      Hypertension This is a chronic problem. The current episode started more than 1 year ago. The problem has been gradually improving since onset. The problem is controlled. Pertinent negatives include no blurred vision, chest pain, headaches, palpitations or shortness of breath. Risk factors for coronary artery disease include post-menopausal state and sedentary lifestyle. The current treatment provides moderate improvement. Compliance problems include exercise.      Past Medical History:  Diagnosis Date  . Abnormal gait   . Amnesia   . Anemia    hx  . Aneurysm (HCC)   . Anxiety   . Asthma   . Atrophic vaginitis   . Benign hypertensive heart disease   . Cardiomegaly   . Chest pain   . COPD (chronic obstructive pulmonary disease) (HCC)     - PFTs  01/24/06 FEV1 69% ratio 68% diffusing capacity 57% with no improvement after B2   - PFT's 10/1/ 09 FEV1 77   ratio  76    dlco                   45            no resp to B2    - Nl alpha one antitripsin level 10/09  . Cough   . Depression   . Difficulty speaking   . Disorder of nasal cavity   . Dyspnea    . Enthesopathy of elbow region   . GERD (gastroesophageal reflux disease)   . Hammer toe   . Headache(784.0)   . Hemiparesis affecting dominant side as late effect of cerebrovascular accident (HCC)   . History of kidney stones   . Hyperlipidemia   . Hypertension   . Hypothyroidism   . Increased urinary frequency   . Low compliance bladder   . Macular degeneration of both eyes   . Malaise and fatigue   . Nonruptured cerebral aneurysm   . Obesity   . Pneumonia    hx  . Pure hypercholesterolemia   . Recurrent major depressive episodes (HCC)   . Seizure disorder, secondary (HCC)    2-3 focal siezures monthly  . Seizures (HCC)   . Short-term memory loss    since stroke  . Skin sensation disturbance   . Stroke [facility]) 2011   rt sided weakness  . Swelling of limb   . TMJ (temporomandibular joint disorder)   . Urinary tract infectious disease   . Vertigo as late effect of stroke   . Vitamin D deficiency      Family History  Problem Relation Age  of Onset  . Heart disease Mother   . Heart disease Father   . Heart attack Father   . Cancer Father        prostate to lymph node  . Breast cancer Maternal Aunt   . Breast cancer Cousin   . Breast cancer Cousin   . Lung cancer Other      Current Outpatient Medications:  .  albuterol (PROVENTIL) (2.5 MG/3ML) 0.083% nebulizer solution, Take 3 mLs (2.5 mg total) by nebulization every 6 (six) hours., Disp: 1080 mL, Rfl: 1 .  albuterol (VENTOLIN HFA) 108 (90 Base) MCG/ACT inhaler, Inhale 2 puffs into the lungs every 6 (six) hours as needed for wheezing or shortness of breath., Disp: 24 g, Rfl: 3 .  aspirin EC 81 MG tablet, Take 81 mg by mouth daily., Disp: , Rfl:  .  Biotin 1 MG CAPS, Take 1 tablet by mouth 2 (two) times daily. Viviscal, Disp: , Rfl:  .  Calcium-Magnesium-Zinc (CAL-MAG-ZINC PO), Take 1 tablet by mouth daily., Disp: , Rfl:  .  Cholecalciferol (VITAMIN D-3) 1000 units CAPS, Take 2,000 Units by mouth at bedtime., Disp: ,  Rfl:  .  clonazePAM (KLONOPIN) 0.5 MG tablet, Take 0.5 tablets (0.25 mg total) by mouth daily as needed for anxiety., Disp: 15 tablet, Rfl: 1 .  clopidogrel (PLAVIX) 75 MG tablet, TAKE 1 TABLET DAILY, Disp: 90 tablet, Rfl: 3 .  diclofenac Sodium (VOLTAREN) 1 % GEL, Voltaren 1 % topical gel  APPLY 2 GRAM TO THE AFFECTED AREA(S) BY TOPICAL ROUTE 4 TIMES PER DAY, Disp: 350 g, Rfl: 2 .  escitalopram (LEXAPRO) 20 MG tablet, Take 1 tablet (20 mg total) by mouth at bedtime., Disp: 90 tablet, Rfl: 1 .  fluticasone (FLONASE) 50 MCG/ACT nasal spray, Place 2 sprays into both nostrils daily as needed (allergies or post-nasl drip)., Disp: , Rfl: 3 .  levETIRAcetam (KEPPRA) 500 MG tablet, Take 1 tablet (500 mg total) by mouth every 12 (twelve) hours., Disp: 180 tablet, Rfl: 3 .  levothyroxine (SYNTHROID) 50 MCG tablet, Take one tablet by mouth on Saturday and Sunday, Disp: 39 tablet, Rfl: 3 .  levothyroxine (SYNTHROID) 75 MCG tablet, TAKE 1 TABLET IN THE MORNING BEFORE BREAKFAST MONDAY THROUGH FRIDAY (TAKE 50 MCG ON OTHER DAYS), Disp: 51 tablet, Rfl: 4 .  loratadine (CLARITIN) 10 MG tablet, Take 1 tablet (10 mg total) by mouth daily., Disp: 30 tablet, Rfl: 2 .  Multiple Vitamins-Minerals (PRESERVISION/LUTEIN PO), Take 1 tablet by mouth daily., Disp: , Rfl:  .  NONFORMULARY OR COMPOUNDED ITEM, Apply 240 mg topically daily as needed., Disp: 1 each, Rfl: 3 .  Omega-3 Fatty Acids (FISH OIL) 1000 MG CAPS, Take 2,000 mg by mouth 2 (two) times daily. , Disp: , Rfl:  .  Polyethyl Glycol-Propyl Glycol (SYSTANE) 0.4-0.3 % SOLN, Place 1 drop into both eyes daily as needed (dry eyes)., Disp: , Rfl:  .  Psyllium (FIBER) 28.3 % POWD, Take 10 mLs by mouth 2 (two) times daily., Disp: , Rfl:  .  rosuvastatin (CRESTOR) 20 MG tablet, TAKE 1 TABLET DAILY (REPLACES 10 MG DOSE), Disp: 90 tablet, Rfl: 3 .  telmisartan (MICARDIS) 20 MG tablet, Take 1 tablet (20 mg total) by mouth daily., Disp: 90 tablet, Rfl: 3 .  tiZANidine (ZANAFLEX)  4 MG tablet, TAKE 1 TABLET EVERY EVENING AS NEEDED, Disp: 60 tablet, Rfl: 1 .  traZODone (DESYREL) 50 MG tablet, Take 1.5 tablets (75 mg total) by mouth at bedtime as needed for sleep., Disp:  90 tablet, Rfl: 1 .  vitamin C (ASCORBIC ACID) 500 MG tablet, Take 1,000 mg by mouth at bedtime. Rose hips, Disp: , Rfl:    Allergies  Allergen Reactions  . Phenobarbital Hives, Shortness Of Breath and Swelling  . Phenytoin Anaphylaxis, Hives and Swelling     Review of Systems  Constitutional: Negative.   Eyes:  Negative for blurred vision.  Respiratory: Negative.  Negative for shortness of breath.   Cardiovascular: Negative.  Negative for chest pain and palpitations.  Neurological: Negative.  Negative for headaches.  Psychiatric/Behavioral: Negative.       Today's Vitals   03/23/23 1147  BP: 110/82  Pulse: 75  Temp: 98.2 F (36.8 C)  SpO2: 98%  Weight: 156 lb 3.2 oz (70.9 kg)  Height: 5\' 2"  (1.575 m)   Body mass index is 28.57 kg/m.  Wt Readings from Last 3 Encounters:  03/23/23 156 lb 3.2 oz (70.9 kg)  02/02/23 152 lb 12.8 oz (69.3 kg)  12/26/22 160 lb (72.6 kg)    The ASCVD Risk score (Arnett DK, et al., 2019) failed to calculate for the following reasons:   The patient has a prior MI or stroke diagnosis  Objective:  Physical Exam Vitals and nursing note reviewed.  Constitutional:      Appearance: Normal appearance.  HENT:     Head: Normocephalic and atraumatic.  Eyes:     Extraocular Movements: Extraocular movements intact.  Cardiovascular:     Rate and Rhythm: Normal rate and regular rhythm.     Heart sounds: Normal heart sounds.  Pulmonary:     Effort: Pulmonary effort is normal.     Breath sounds: Normal breath sounds.  Musculoskeletal:     Comments: Ambulatory w/ four prong cane   Skin:    General: Skin is warm.  Neurological:     General: No focal deficit present.     Mental Status: She is alert.  Psychiatric:        Mood and Affect: Mood normal.         Behavior: Behavior normal.        Assessment And Plan:  Hypertensive heart disease without heart failure -     CMP14+EGFR -     Lipid panel -     CBC  Atherosclerosis of aorta (HCC)  Cervical myelopathy (HCC)  Right spastic hemiparesis (HCC)  Estrogen deficiency -     DG Bone Density; Future  Other abnormal glucose -     Hemoglobin A1c  Immunization due -     Tdap vaccine greater than or equal to 7yo IM  Urge incontinence -     Ambulatory referral to Physical Therapy  Paresthesia of right lower extremity    Return in 4 months (on 07/24/2023), or bp check.  Patient was given opportunity to ask questions. Patient verbalized understanding of the plan and was able to repeat key elements of the plan. All questions were answered to their satisfaction.    I, Gwynneth Aliment, MD, have reviewed all documentation for this visit. The documentation on 03/23/23 for the exam, diagnosis, procedures, and orders are all accurate and complete.   IF YOU HAVE BEEN REFERRED TO A SPECIALIST, IT MAY TAKE 1-2 WEEKS TO SCHEDULE/PROCESS THE REFERRAL. IF YOU HAVE NOT HEARD FROM US/SPECIALIST IN TWO WEEKS, PLEASE GIVE [place] A CALL AT [phone].

## 2023-03-23 NOTE — Patient Instructions (Signed)
Hypertension, Adult Hypertension is another name for high blood pressure. High blood pressure forces your heart to work harder to pump blood. This can cause problems over time. There are two numbers in a blood pressure reading. There is a top number (systolic) over a bottom number (diastolic). It is best to have a blood pressure that is below 120/80. What are the causes? The cause of this condition is not known. Some other conditions can lead to high blood pressure. What increases the risk? Some lifestyle factors can make you more likely to develop high blood pressure: Smoking. Not getting enough exercise or physical activity. Being overweight. Having too much fat, sugar, calories, or salt (sodium) in your diet. Drinking too much alcohol. Other risk factors include: Having any of these conditions: Heart disease. Diabetes. High cholesterol. Kidney disease. Obstructive sleep apnea. Having a family history of high blood pressure and high cholesterol. Age. The risk increases with age. Stress. What are the signs or symptoms? High blood pressure may not cause symptoms. Very high blood pressure (hypertensive crisis) may cause: Headache. Fast or uneven heartbeats (palpitations). Shortness of breath. Nosebleed. Vomiting or feeling like you may vomit (nauseous). Changes in how you see. Very bad chest pain. Feeling dizzy. Seizures. How is this treated? This condition is treated by making healthy lifestyle changes, such as: Eating healthy foods. Exercising more. Drinking less alcohol. Your doctor may prescribe medicine if lifestyle changes do not help enough and if: Your top number is above 130. Your bottom number is above 80. Your personal target blood pressure may vary. Follow these instructions at home: Eating and drinking  If told, follow the DASH eating plan. To follow this plan: Fill one half of your plate at each meal with fruits and vegetables. Fill one fourth of your plate  at each meal with whole grains. Whole grains include whole-wheat pasta, brown rice, and whole-grain bread. Eat or drink low-fat dairy products, such as skim milk or low-fat yogurt. Fill one fourth of your plate at each meal with low-fat (lean) proteins. Low-fat proteins include fish, chicken without skin, eggs, beans, and tofu. Avoid fatty meat, cured and processed meat, or chicken with skin. Avoid pre-made or processed food. Limit the amount of salt in your diet to less than 1,500 mg each day. Do not drink alcohol if: Your doctor tells you not to drink. You are pregnant, may be pregnant, or are planning to become pregnant. If you drink alcohol: Limit how much you have to: 0-1 drink a day for women. 0-2 drinks a day for men. Know how much alcohol is in your drink. In the [place] one drink equals one 12 oz bottle of beer (355 mL), one 5 oz glass of wine (148 mL), or one 1 oz glass of hard liquor (44 mL). Lifestyle  Work with your doctor to stay at a healthy weight or to lose weight. Ask your doctor what the best weight is for you. Get at least 30 minutes of exercise that causes your heart to beat faster (aerobic exercise) most days of the week. This may include walking, swimming, or biking. Get at least 30 minutes of exercise that strengthens your muscles (resistance exercise) at least 3 days a week. This may include lifting weights or doing Pilates. Do not smoke or use any products that contain nicotine or tobacco. If you need help quitting, ask your doctor. Check your blood pressure at home as told by your doctor. Keep all follow-up visits. Medicines Take over-the-counter and prescription medicines   only as told by your doctor. Follow directions carefully. Do not skip doses of blood pressure medicine. The medicine does not work as well if you skip doses. Skipping doses also puts you at risk for problems. Ask your doctor about side effects or reactions to medicines that you should watch  for. Contact a doctor if: You think you are having a reaction to the medicine you are taking. You have headaches that keep coming back. You feel dizzy. You have swelling in your ankles. You have trouble with your vision. Get help right away if: You get a very bad headache. You start to feel mixed up (confused). You feel weak or numb. You feel faint. You have very bad pain in your: Chest. Belly (abdomen). You vomit more than once. You have trouble breathing. These symptoms may be an emergency. Get help right away. Call 911. Do not wait to see if the symptoms will go away. Do not drive yourself to the hospital. Summary Hypertension is another name for high blood pressure. High blood pressure forces your heart to work harder to pump blood. For most people, a normal blood pressure is less than 120/80. Making healthy choices can help lower blood pressure. If your blood pressure does not get lower with healthy choices, you may need to take medicine. This information is not intended to replace advice given to you by your health care provider. Make sure you discuss any questions you have with your health care provider. Document Revised: 06/10/2021 Document Reviewed: 06/10/2021 Elsevier Patient Education  2024 [employer]  

## 2023-03-24 LAB — CBC
Hematocrit: 44.4 % (ref 34.0–46.6)
Hemoglobin: 14.2 g/dL (ref 11.1–15.9)
MCH: 30.1 pg (ref 26.6–33.0)
MCHC: 32 g/dL (ref 31.5–35.7)
MCV: 94 fL (ref 79–97)
Platelets: 208 10*3/uL (ref 150–450)
RBC: 4.71 x10E6/uL (ref 3.77–5.28)
RDW: 13.1 % (ref 11.7–15.4)
WBC: 6.7 10*3/uL (ref 3.4–10.8)

## 2023-03-24 LAB — CMP14+EGFR
ALT: 20 IU/L (ref 0–32)
AST: 20 IU/L (ref 0–40)
Albumin: 4.5 g/dL (ref 3.9–4.9)
Alkaline Phosphatase: 103 IU/L (ref 44–121)
BUN/Creatinine Ratio: 27 (ref 12–28)
BUN: 18 mg/dL (ref 8–27)
Bilirubin Total: 0.7 mg/dL (ref 0.0–1.2)
CO2: 28 mmol/L (ref 20–29)
Calcium: 9.7 mg/dL (ref 8.7–10.3)
Chloride: 101 mmol/L (ref 96–106)
Creatinine, Ser: 0.66 mg/dL (ref 0.57–1.00)
Globulin, Total: 2.4 g/dL (ref 1.5–4.5)
Glucose: 96 mg/dL (ref 70–99)
Potassium: 5 mmol/L (ref 3.5–5.2)
Sodium: 142 mmol/L (ref 134–144)
Total Protein: 6.9 g/dL (ref 6.0–8.5)
eGFR: 97 mL/min/{1.73_m2} (ref >59)

## 2023-03-24 LAB — HEMOGLOBIN A1C
Est. average glucose Bld gHb Est-mCnc: 117 mg/dL
Hgb A1c MFr Bld: 5.7 % — ABNORMAL HIGH (ref 4.8–5.6)

## 2023-03-24 LAB — LIPID PANEL
Chol/HDL Ratio: 2 ratio (ref 0.0–4.4)
Cholesterol, Total: 149 mg/dL (ref 100–199)
HDL: 74 mg/dL (ref >39)
LDL Chol Calc (NIH): 57 mg/dL (ref 0–99)
Triglycerides: 103 mg/dL (ref 0–149)
VLDL Cholesterol Cal: 18 mg/dL (ref 5–40)

## 2023-03-27 DIAGNOSIS — R7309 Other abnormal glucose: Secondary | ICD-10-CM | POA: Insufficient documentation

## 2023-03-27 DIAGNOSIS — R202 Paresthesia of skin: Secondary | ICD-10-CM | POA: Insufficient documentation

## 2023-03-27 DIAGNOSIS — E2839 Other primary ovarian failure: Secondary | ICD-10-CM | POA: Insufficient documentation

## 2023-03-27 DIAGNOSIS — N3941 Urge incontinence: Secondary | ICD-10-CM | POA: Insufficient documentation

## 2023-03-27 NOTE — Assessment & Plan Note (Signed)
Chronic, she agrees to PT evaluation in hopes this will strengthen her pelvic floor. She is encouraged to perform the exercises she learns at home as well.

## 2023-03-27 NOTE — Assessment & Plan Note (Signed)
Chronic, well controlled.  She will c/w telmisartan 20mg  daily. She is encouraged to follow a low sodium diet.

## 2023-03-27 NOTE — Assessment & Plan Note (Signed)
Incidental finding on CT A/P performed 11/06/2018--[facility]. LDL goal < 70.  She will c/w rosuvastatin 20mg  daily.

## 2023-03-27 NOTE — Assessment & Plan Note (Signed)
She is followed by Neurosurgery.  She is s/p [facility] C4/5 and L carpal tunnel release 09/09/2021.

## 2023-03-27 NOTE — Assessment & Plan Note (Signed)
Previous labs reviewed, her A1c has been elevated in the past. I will check an A1c today. Reminded to avoid refined sugars including sugary drinks/foods and processed meats including bacon, sausages and deli meats.  

## 2023-03-27 NOTE — Assessment & Plan Note (Signed)
She is encouraged to engage in weight-bearing exercises at least three days per week. She agrees to bone density.

## 2023-03-27 NOTE — Assessment & Plan Note (Signed)
Chronic, result of previous CVA. She has had physical therapy and ambulatory with cane. She uses muscle relaxers as needed.

## 2023-03-28 LAB — SPECIMEN STATUS REPORT

## 2023-03-28 LAB — TSH: TSH: 0.824 u[IU]/mL (ref 0.450–4.500)

## 2023-03-28 LAB — T4, FREE: Free T4: 1.33 ng/dL (ref 0.82–1.77)

## 2023-04-24 NOTE — Progress Notes (Unsigned)
BH MD/PA/NP OP Progress Note  04/25/2023 12:44 PM INDIKA COLTRAIN  MRN:  [id number]  Visit Diagnosis:    ICD-10-CM   1. GAD (generalized anxiety disorder)  F41.1 escitalopram (LEXAPRO) 20 MG tablet    traZODone (DESYREL) 50 MG tablet    2. Moderate episode of recurrent major depressive disorder (HCC)  F33.1 escitalopram (LEXAPRO) 20 MG tablet    traZODone (DESYREL) 50 MG tablet       Assessment: KINZI DRUM is a 67 y.o. 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    During initial evaluation patient reported symptoms of depression including anhedonia, feelings of hopelessness, increased fatigue, decreased appetite, difficulty sleeping, difficulty concentrating, and feelings of worthlessness.  She denied any SI/HI or thoughts of self-harm along with any past history of mania or psychosis.  Patient also endorsed symptoms of anxiety including feeling nervous or on edge, being unable to control her worrying, worrying too much with different things, difficulty relaxing, and irritability.  Of note patient had a left frontoparietal parenchymal hemorrhage in March 2011 which correlated with the onset of her cognitive impairment, onset of seizures, and onset of depressive symptoms.  Patient was started on Keppra around that time and we discussed the possibility that it could be contributing to her increased irritability/agitation.    Anselm Lis Baiza presents for follow-up evaluation. Today, 04/25/23, patient reports some improvement in mood over the past 6 weeks though she does continue to struggle with a grief response following her son's death.  When thinking about this she can find herself ruminating on past events or changes she could have made to prevent this outcome.  Patient has had good benefit from the  Klonopin and used it a few times over the past 6 weeks.  She denies any adverse side effects other than some decreased ability which she has accommodated for.  We will continue on her current regimen and follow-up in 2 months.  Plan: - Continue Lexapro (escitalopram) 20 mg at bedtime  - Decrease trazodone to 75 mg at bedtime as needed - Start Klonopin 0.25 QD prn for anxiety - EKG reviewed QTC 462 - Discuss Keppra with neurologist - Continue Grief counseling - CBC, CMP, lipid profile, Vit D, and TSH reviewed - Follow up in 6 weeks  Chief Complaint:  Chief Complaint  Patient presents with   Follow-up   HPI: [name] presents reporting that things are going all right for her today.  She is still struggling with the grief of losing her son [name].  There has been some slight improvement in this grief over the past 6 weeks where she is able to think about him and happy memories without going into excessive tearfulness.  At times though this might be more due to avoidance and trying not to think about the loss.  [name] notes that she is not sure she will ever get to a point of accepting what has happened and she does still have some anger/resentment towards her daughter and husband.  At the same time she is analyzing her choices over the course of her life and how they might have led to her son's suicide.  We did explore this with her and how there are many factors including genetics and environmental that can lead to somebody feeling depressed and the thought process does not always follow a line of logic.  That being said it would be very difficult to go back and identify any particular incident that could be the cause of the suicide.  Patient did acknowledge that and did note that she is not ruminating on the past as frequently.  She also notes that she has used the Klonopin a few times over the past month and a half and found that it did help with her grief during those times.  Typically she would use it for  2-3 nights in a row and then would not need it again for another week or 2.  She does note that it has made her feel a little bit less steady when she takes it but she denies any falls and has been making sure she is steady before moving.  Of note she had a really bad fall a couple weeks ago, where her grandson and dog were under her feet. She hit her head and had some swelling afterwards. Denies any LOC, changes in vision, or new headaches. She has had some headaches off and on but these were present before the fall.  Patient reports that she had not been taking the Klonopin around that time.  She was encouraged to reach out to her PCP to let him know about the fall as well.  Past Psychiatric History: Patient reports that her psychiatric symptoms/depression started after 2011 when she had her stroke prior to that she reports minimal to no depressive symptoms.  She has been on Celexa for as long as she can remember post stroke and believes she was started on Xanax at the same time.  Patient denies any prior psychiatric hospitalizations or past suicide attempts.  She denies any current substance use since 2005 and reports drinking alcohol when she was younger.   Past Medical History:  Past Medical History:  Diagnosis Date   Abnormal gait    Amnesia    Anemia    hx   Aneurysm (HCC)    Anxiety    Asthma    Atrophic vaginitis    Benign hypertensive heart disease    Cardiomegaly    Chest pain    COPD (chronic obstructive pulmonary disease) (HCC)     - PFTs  01/24/06 FEV1 69% ratio 68% diffusing capacity 57% with no improvement after B2   - PFT's 10/1/ 09 FEV1 77   ratio  76    dlco                   45            no resp to B2    - Nl alpha one antitripsin level 10/09   Cough    Depression    Difficulty speaking    Disorder of nasal cavity    Dyspnea    Enthesopathy of elbow region    GERD (gastroesophageal reflux disease)    Hammer toe    Headache(784.0)    Hemiparesis affecting dominant  side as late effect of cerebrovascular accident (HCC)    History of kidney stones    Hyperlipidemia    Hypertension    Hypothyroidism    Increased urinary frequency    Low compliance bladder    Macular degeneration of both eyes    Malaise and fatigue    Nonruptured cerebral aneurysm    Obesity    Pneumonia    hx   Pure hypercholesterolemia    Recurrent major depressive episodes (HCC)    Seizure disorder, secondary (HCC)  2-3 focal siezures monthly   Seizures (HCC)    Short-term memory loss    since stroke   Skin sensation disturbance    Stroke [facility]) 2011   rt sided weakness   Swelling of limb    TMJ (temporomandibular joint disorder)    Urinary tract infectious disease    Vertigo as late effect of stroke    Vitamin D deficiency     Past Surgical History:  Procedure Laterality Date   ANTERIOR CERVICAL DECOMP/DISCECTOMY FUSION N/A 09/09/2021   Procedure: Anterior  Cervical Decompression Fusion  Cervical four-five, Cervical five-six;  Surgeon: Bethann Goo, DO;  Location: [facility] OR;  Service: Neurosurgery;  Laterality: N/A;   BACK SURGERY     CARPAL TUNNEL RELEASE Left 09/09/2021   Procedure: LEFT CARPAL TUNNEL RELEASE;  Surgeon: Bethann Goo, DO;  Location: [facility] OR;  Service: Neurosurgery;  Laterality: Left;   CEREBRAL ANGIOGRAM     x3   COLONOSCOPY WITH PROPOFOL N/A 08/18/2016   Procedure: COLONOSCOPY WITH PROPOFOL;  Surgeon: Jeani Hawking, MD;  Location: [facility] ENDOSCOPY;  Service: Endoscopy;  Laterality: N/A;   LAPAROSCOPIC HYSTERECTOMY     MANDIBLE FRACTURE SURGERY     RADIOLOGY WITH ANESTHESIA N/A 08/21/2013   Procedure: RADIOLOGY WITH ANESTHESIA;  Surgeon: Oneal Grout, MD;  Location: [facility] OR;  Service: Radiology;  Laterality: N/A;   stones     no surgery    Family Psychiatric History: Denies  Family History:  Family History  Problem Relation Age of Onset   Heart disease Mother    Heart disease Father    Heart attack Father    Cancer Father        prostate to  lymph node   Breast cancer Maternal Aunt    Breast cancer Cousin    Breast cancer Cousin    Lung cancer Other     Social History:  Social History   Socioeconomic History   Marital status: Married    Spouse name: Windy Fast   Number of children: 2   Years of education: 14   Highest education level: Not on file  Occupational History   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    Drug use: No   Sexual activity: Not Currently  Other Topics Concern   Not on file  Social History Narrative   Patient lives at home with her husband Windy Fast).   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    Patient is on disability.    Patient has an Associates degree.   Patient is right-handed.   Patient drinks 2 cups of green tea most days.               Social Determinants of Health   Financial Resource Strain: Low Risk  (09/28/2022)  Overall Financial Resource Strain (CARDIA)    Difficulty of Paying Living Expenses: Not hard at all  Food Insecurity: No Food Insecurity (09/28/2022)   Hunger Vital Sign    Worried About Running Out of Food in the Last Year: Never true    Ran Out of Food in the Last Year: Never true  Transportation Needs: No Transportation Needs (09/28/2022)   PRAPARE - Administrator, Civil Service (Medical): No    Lack of Transportation (Non-Medical): No  Physical Activity: Inactive (09/28/2022)    Exercise Vital Sign    Days of Exercise per Week: 0 days    Minutes of Exercise per Session: 0 min  Stress: Stress Concern Present (09/28/2022)   [employer] of Occupational Health - Occupational Stress Questionnaire    Feeling of Stress : To some extent  Social Connections: Not on file    Allergies:  Allergies  Allergen Reactions   Phenobarbital Hives, Shortness Of Breath and Swelling   Phenytoin Anaphylaxis, Hives and Swelling    Current Medications: Current Outpatient Medications  Medication Sig Dispense Refill   albuterol (PROVENTIL) (2.5 MG/3ML) 0.083% nebulizer solution Take 3 mLs (2.5 mg total) by nebulization every 6 (six) hours. 1080 mL 1   albuterol (VENTOLIN HFA) 108 (90 Base) MCG/ACT inhaler Inhale 2 puffs into the lungs every 6 (six) hours as needed for wheezing or shortness of breath. 24 g 3   aspirin EC 81 MG tablet Take 81 mg by mouth daily.     Biotin 1 MG CAPS Take 1 tablet by mouth 2 (two) times daily. Viviscal     Calcium-Magnesium-Zinc (CAL-MAG-ZINC PO) Take 1 tablet by mouth daily.     Cholecalciferol (VITAMIN D-3) 1000 units CAPS Take 2,000 Units by mouth at bedtime.     clonazePAM (KLONOPIN) 0.5 MG tablet Take 0.5 tablets (0.25 mg total) by mouth daily as needed for anxiety. 15 tablet 1   clopidogrel (PLAVIX) 75 MG tablet TAKE 1 TABLET DAILY 90 tablet 3   diclofenac Sodium (VOLTAREN) 1 % GEL Voltaren 1 % topical gel  APPLY 2 GRAM TO THE AFFECTED AREA(S) BY TOPICAL ROUTE 4 TIMES PER DAY 350 g 2   escitalopram (LEXAPRO) 20 MG tablet Take 1 tablet (20 mg total) by mouth at bedtime. 90 tablet 1   fluticasone (FLONASE) 50 MCG/ACT nasal spray Place 2 sprays into both nostrils daily as needed (allergies or post-nasl drip).  3   levETIRAcetam (KEPPRA) 500 MG tablet Take 1 tablet (500 mg total) by mouth every 12 (twelve) hours. 180 tablet 3   levothyroxine (SYNTHROID) 50 MCG tablet Take one tablet by mouth on Saturday and Sunday 39 tablet 3   levothyroxine  (SYNTHROID) 75 MCG tablet TAKE 1 TABLET IN THE MORNING BEFORE BREAKFAST MONDAY THROUGH FRIDAY (TAKE 50 MCG ON OTHER DAYS) 51 tablet 4   loratadine (CLARITIN) 10 MG tablet Take 1 tablet (10 mg total) by mouth daily. 30 tablet 2   Multiple Vitamins-Minerals (PRESERVISION/LUTEIN PO) Take 1 tablet by mouth daily.     NONFORMULARY OR COMPOUNDED ITEM Apply 240 mg topically daily as needed. 1 each 3   Omega-3 Fatty Acids (FISH OIL) 1000 MG CAPS Take 2,000 mg by mouth 2 (two) times daily.      Polyethyl Glycol-Propyl Glycol (SYSTANE) 0.4-0.3 % SOLN Place 1 drop into both eyes daily as needed (dry eyes).     Psyllium (FIBER) 28.3 % POWD Take 10 mLs by mouth 2 (two) times daily.     rosuvastatin (CRESTOR)  20 MG tablet TAKE 1 TABLET DAILY (REPLACES 10 MG DOSE) 90 tablet 3   telmisartan (MICARDIS) 20 MG tablet Take 1 tablet (20 mg total) by mouth daily. 90 tablet 3   tiZANidine (ZANAFLEX) 4 MG tablet TAKE 1 TABLET EVERY EVENING AS NEEDED 60 tablet 1   traZODone (DESYREL) 50 MG tablet Take 1.5 tablets (75 mg total) by mouth at bedtime as needed for sleep. 135 tablet 1   vitamin C (ASCORBIC ACID) 500 MG tablet Take 1,000 mg by mouth at bedtime. Rose hips     No current facility-administered medications for this visit.     Musculoskeletal: Strength & Muscle Tone: decreased and right hemiparesis secondary to past stroke Gait & Station: unsteady Patient leans: Left  Psychiatric Specialty Exam: Review of Systems  Blood pressure 134/78, pulse 82, height 5\' 2"  (1.575 m), weight 157 lb (71.2 kg).Body mass index is 28.72 kg/m.  General Appearance: Well Groomed  Eye Contact:  Good  Speech:  Clear and Coherent and Normal Rate  Volume:  Normal  Mood:  Anxious and Depressed  Affect:  Congruent and Tearful  Thought Process:  Coherent  Orientation:  Full (Time, Place, and Person)  Thought Content: Logical, Illogical, and Rumination   Suicidal Thoughts:  No  Homicidal Thoughts:  No  Memory:  Immediate;    Fair  Judgement:  Good  Insight:  Good  Psychomotor Activity:  Normal  Concentration:  Concentration: Good  Recall:  Fair  Fund of Knowledge: Fair  Language: Good  Akathisia:  NA    AIMS (if indicated): not done  Assets:  Communication Skills Desire for Improvement Housing Intimacy Resilience Social Support Transportation Vocational/Educational  ADL's:  Intact  Cognition: WNL  Sleep:  Fair   Metabolic Disorder Labs: Lab Results  Component Value Date   HGBA1C 5.7 (H) 03/23/2023   No results found for: "PROLACTIN" Lab Results  Component Value Date   CHOL 149 03/23/2023   TRIG 103 03/23/2023   HDL 74 03/23/2023   CHOLHDL 2.0 03/23/2023   VLDL 15 11/23/2009   LDLCALC 57 03/23/2023   LDLCALC 35 05/18/2022   Lab Results  Component Value Date   TSH 0.824 03/23/2023   TSH 1.500 09/28/2022    Therapeutic Level Labs: No results found for: "LITHIUM" No results found for: "VALPROATE" No results found for: "CBMZ"   Screenings: GAD-7    Flowsheet Row Office Visit [facility] 07/04/2022 in [facility]  Total GAD-7 Score 16      Mini-Mental    Flowsheet Row Clinical Support from 09/03/2018 in [facility] Office Visit from 11/03/2015 in [facility] Office Visit from 05/05/2015 in [facility] Office Visit from 11/05/2014 in [facility]  Total Score (max 30 points ) 29 27 26 28       PHQ2-9    Flowsheet Row Office Visit from 03/23/2023 in [facility] Office Visit from 11/21/2022 in [facility] Clinical Support from 09/28/2022 in [facility] Office Visit from 07/04/2022 in [facility] Office Visit from 05/18/2022 in [facility]  PHQ-2 Total  Score 4 0 0 5 6  PHQ-9 Total Score 9 5 -- 19 17      Flowsheet Row ED from 08/09/2022 in [facility] Office Visit from 07[facility]04/2022 in [facility] Admission (Discharged) from 09/09/2021 in [facility]  C-SSRS RISK CATEGORY No Risk No Risk No Risk       Collaboration of Care: Collaboration of Care: Medication Management AEB medication prescription and Other provider involved in patient's care AEB ophthalmology and internal med chart review  Patient/Guardian was advised Release of Information must be obtained prior to any record release in order to collaborate their care with an outside provider. Patient/Guardian was advised if they have not already done so to contact the registration department to sign all necessary forms in order for [place] to release information regarding their care.   Consent: Patient/Guardian gives verbal consent for treatment and assignment of benefits for services provided during this visit. Patient/Guardian expressed understanding and agreed to proceed.    Stasia Cavalier, MD 04/25/2023, 12:44 PM

## 2023-04-25 ENCOUNTER — Encounter (HOSPITAL_COMMUNITY): Payer: Self-pay | Admitting: Psychiatry

## 2023-04-25 ENCOUNTER — Ambulatory Visit (HOSPITAL_BASED_OUTPATIENT_CLINIC_OR_DEPARTMENT_OTHER): Payer: Medicare Other | Admitting: Psychiatry

## 2023-04-25 VITALS — BP 134/78 | HR 82 | Ht 62.0 in | Wt 157.0 lb

## 2023-04-25 DIAGNOSIS — F411 Generalized anxiety disorder: Secondary | ICD-10-CM

## 2023-04-25 DIAGNOSIS — F331 Major depressive disorder, recurrent, moderate: Secondary | ICD-10-CM

## 2023-04-25 MED ORDER — ESCITALOPRAM OXALATE 20 MG PO TABS
20.0000 mg | ORAL_TABLET | Freq: Every day | ORAL | 1 refills | Status: DC
Start: 2023-04-25 — End: 2023-07-11

## 2023-04-25 MED ORDER — TRAZODONE HCL 50 MG PO TABS
75.0000 mg | ORAL_TABLET | Freq: Every evening | ORAL | 1 refills | Status: DC | PRN
Start: 2023-04-25 — End: 2023-10-05

## 2023-04-26 DIAGNOSIS — R278 Other lack of coordination: Secondary | ICD-10-CM | POA: Diagnosis not present

## 2023-04-26 DIAGNOSIS — N3946 Mixed incontinence: Secondary | ICD-10-CM | POA: Diagnosis not present

## 2023-04-26 DIAGNOSIS — M6281 Muscle weakness (generalized): Secondary | ICD-10-CM | POA: Diagnosis not present

## 2023-04-30 ENCOUNTER — Other Ambulatory Visit: Payer: Self-pay | Admitting: Internal Medicine

## 2023-05-09 DIAGNOSIS — H353211 Exudative age-related macular degeneration, right eye, with active choroidal neovascularization: Secondary | ICD-10-CM | POA: Diagnosis not present

## 2023-05-09 DIAGNOSIS — H35033 Hypertensive retinopathy, bilateral: Secondary | ICD-10-CM | POA: Diagnosis not present

## 2023-05-09 DIAGNOSIS — H353122 Nonexudative age-related macular degeneration, left eye, intermediate dry stage: Secondary | ICD-10-CM | POA: Diagnosis not present

## 2023-05-09 DIAGNOSIS — H43812 Vitreous degeneration, left eye: Secondary | ICD-10-CM | POA: Diagnosis not present

## 2023-05-09 DIAGNOSIS — H43821 Vitreomacular adhesion, right eye: Secondary | ICD-10-CM | POA: Diagnosis not present

## 2023-06-01 DIAGNOSIS — M6281 Muscle weakness (generalized): Secondary | ICD-10-CM | POA: Diagnosis not present

## 2023-06-01 DIAGNOSIS — R278 Other lack of coordination: Secondary | ICD-10-CM | POA: Diagnosis not present

## 2023-06-01 DIAGNOSIS — N3946 Mixed incontinence: Secondary | ICD-10-CM | POA: Diagnosis not present

## 2023-06-08 ENCOUNTER — Telehealth: Payer: Self-pay | Admitting: Neurology

## 2023-06-08 NOTE — Telephone Encounter (Signed)
Received a paper fax from [facility] via fax needing Dr Pearlean Brownie to sign for genetic testing. There is no discussion about this being completed.  The company called in asking if Dr Pearlean Brownie would sign for this to be completed for the patient. At this time we request that the patient calls in discusses why she is wanting to complete this and see if its truly an appropriate lab for the pt.

## 2023-06-13 DIAGNOSIS — N3946 Mixed incontinence: Secondary | ICD-10-CM | POA: Diagnosis not present

## 2023-06-13 DIAGNOSIS — R278 Other lack of coordination: Secondary | ICD-10-CM | POA: Diagnosis not present

## 2023-06-13 DIAGNOSIS — M6281 Muscle weakness (generalized): Secondary | ICD-10-CM | POA: Diagnosis not present

## 2023-06-20 ENCOUNTER — Ambulatory Visit (HOSPITAL_COMMUNITY): Payer: Medicare Other | Admitting: Psychiatry

## 2023-06-28 DIAGNOSIS — M6281 Muscle weakness (generalized): Secondary | ICD-10-CM | POA: Diagnosis not present

## 2023-06-28 DIAGNOSIS — N3946 Mixed incontinence: Secondary | ICD-10-CM | POA: Diagnosis not present

## 2023-06-28 DIAGNOSIS — R278 Other lack of coordination: Secondary | ICD-10-CM | POA: Diagnosis not present

## 2023-07-01 ENCOUNTER — Encounter: Payer: Self-pay | Admitting: Internal Medicine

## 2023-07-03 ENCOUNTER — Other Ambulatory Visit: Payer: Self-pay

## 2023-07-03 MED ORDER — TELMISARTAN 20 MG PO TABS: 20.0000 mg | ORAL_TABLET | Freq: Every day | ORAL | 3 refills | Status: DC

## 2023-07-08 ENCOUNTER — Other Ambulatory Visit: Payer: Self-pay | Admitting: Internal Medicine

## 2023-07-08 DIAGNOSIS — E039 Hypothyroidism, unspecified: Secondary | ICD-10-CM

## 2023-07-10 NOTE — Progress Notes (Unsigned)
BH MD/PA/NP OP Progress Note  07/11/2023 3:12 PM [name]  MRN:  [id number]  Visit Diagnosis:  No diagnosis found.    Assessment: [name] is a 67 y.o. 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    During initial evaluation patient reported symptoms of depression including anhedonia, feelings of hopelessness, increased fatigue, decreased appetite, difficulty sleeping, difficulty concentrating, and feelings of worthlessness.  She denied any SI/HI or thoughts of self-harm along with any past history of mania or psychosis.  Patient also endorsed symptoms of anxiety including feeling nervous or on edge, being unable to control her worrying, worrying too much with different things, difficulty relaxing, and irritability.  Of note patient had a left frontoparietal parenchymal hemorrhage in March 2011 which correlated with the onset of her cognitive impairment, onset of seizures, and onset of depressive symptoms.  Patient was started on Keppra around that time and we discussed the possibility that it could be contributing to her increased irritability/agitation.    [name] presents for follow-up evaluation. Today, 07/11/23, patient reports    some improvement in mood over the past 6 weeks though she does continue to struggle with a grief response following her son's death.  When thinking about this she can find herself ruminating on past events or changes she could have made to prevent this outcome.  Patient has had good benefit from the Klonopin and used it a few times over the past 6 weeks.  She denies any adverse side effects other than some decreased ability which she has accommodated for.  We will continue on her current regimen and follow-up in 2 months.  Plan: - Taper Lexapro  (escitalopram) to 10 mg at bedtime  - Increase trazodone to 75-100 mg at bedtime as needed - Continue Klonopin 0.25 QD prn for anxiety - EKG reviewed QTC 462 - Discuss Keppra with neurologist - Continue Grief counseling - CBC, CMP, lipid profile, Vit D, and TSH reviewed - Follow up in 6 weeks  Chief Complaint:  Chief Complaint  Patient presents with   Follow-up   HPI: [name] presents reporting that    Went to [place], which went a lot better then she was expecting   fell 3 times One time relaetd to the dog, which they have since given away do this and their lack of ability to provide enough activity for the dog. Fell at the airport and at the gym. The gym was an issue with her body not responding much  Denies a dizziness or sedation  related to this  Not going to have thanksgiving this year. Going to go to [place] with her daughter and the twins instead.   Insomnia still an issue. Will try increaing back to 100 mg daily   Just started getting some tremors in her whole body that started recently. Some nerves on why this is happening. Episodes can last around 15 minutes. Denied chest pain, SOB, or light headedness   things are going all right for her today.  She is still struggling with the grief of losing her son [name].  There has been some slight improvement in this grief over the past 6 weeks where she is able to think about him and happy memories without going into excessive tearfulness.  At times though this might be more due to avoidance and trying not to  think about the loss.  [name] notes that she is not sure she will ever get to a point of accepting what has happened and she does still have some anger/resentment towards her daughter and husband.  At the same time she is analyzing her choices over the course of her life and how they might have led to her son's suicide.  We did explore this with her and how there are many factors including genetics and environmental that can lead to  somebody feeling depressed and the thought process does not always follow a line of logic.  That being said it would be very difficult to go back and identify any particular incident that could be the cause of the suicide.  Patient did acknowledge that and did note that she is not ruminating on the past as frequently.  She also notes that she has used the Klonopin a few times over the past month and a half and found that it did help with her grief during those times.  Typically she would use it for 2-3 nights in a row and then would not need it again for another week or 2.  She does note that it has made her feel a little bit less steady when she takes it but she denies any falls and has been making sure she is steady before moving.  Of note she had a really bad fall a couple weeks ago, where her grandson and dog were under her feet. She hit her head and had some swelling afterwards. Denies any LOC, changes in vision, or new headaches. She has had some headaches off and on but these were present before the fall.  Patient reports that she had not been taking the Klonopin around that time.  She was encouraged to reach out to her PCP to let him know about the fall as well.  Past Psychiatric History: Patient reports that her psychiatric symptoms/depression started after 2011 when she had her stroke prior to that she reports minimal to no depressive symptoms.  She has been on Celexa for as long as she can remember post stroke and believes she was started on Xanax at the same time.  Patient denies any prior psychiatric hospitalizations or past suicide attempts.  She denies any current substance use since 2005 and reports drinking alcohol when she was younger.   Past Medical History:  Past Medical History:  Diagnosis Date   Abnormal gait    Amnesia    Anemia    hx   Aneurysm (HCC)    Anxiety    Asthma    Atrophic vaginitis    Benign hypertensive heart disease    Cardiomegaly    Chest pain    COPD  (chronic obstructive pulmonary disease) (HCC)     - PFTs  01/24/06 FEV1 69% ratio 68% diffusing capacity 57% with no improvement after B2   - PFT's 10/1/ 09 FEV1 77   ratio  76    dlco                   45            no resp to B2    - Nl alpha one antitripsin level 10/09   Cough    Depression    Difficulty speaking    Disorder of nasal cavity    Dyspnea    Enthesopathy of elbow region    GERD (gastroesophageal reflux disease)    Hammer toe    Headache(784.0)  Hemiparesis affecting dominant side as late effect of cerebrovascular accident [facility])    History of kidney stones    Hyperlipidemia    Hypertension    Hypothyroidism    Increased urinary frequency    Low compliance bladder    Macular degeneration of both eyes    Malaise and fatigue    Nonruptured cerebral aneurysm    Obesity    Pneumonia    hx   Pure hypercholesterolemia    Recurrent major depressive episodes (HCC)    Seizure disorder, secondary (HCC)    2-3 focal siezures monthly   Seizures (HCC)    Short-term memory loss    since stroke   Skin sensation disturbance    Stroke [facility]) 2011   rt sided weakness   Swelling of limb    TMJ (temporomandibular joint disorder)    Urinary tract infectious disease    Vertigo as late effect of stroke    Vitamin D deficiency     Past Surgical History:  Procedure Laterality Date   ANTERIOR CERVICAL DECOMP/DISCECTOMY FUSION N/A 09/09/2021   Procedure: Anterior  Cervical Decompression Fusion  Cervical four-five, Cervical five-six;  Surgeon: Bethann Goo, DO;  Location: [facility] OR;  Service: Neurosurgery;  Laterality: N/A;   BACK SURGERY     CARPAL TUNNEL RELEASE Left 09/09/2021   Procedure: LEFT CARPAL TUNNEL RELEASE;  Surgeon: Bethann Goo, DO;  Location: [facility] OR;  Service: Neurosurgery;  Laterality: Left;   CEREBRAL ANGIOGRAM     x3   COLONOSCOPY WITH PROPOFOL N/A 08/18/2016   Procedure: COLONOSCOPY WITH PROPOFOL;  Surgeon: Jeani Hawking, MD;  Location: [facility] ENDOSCOPY;  Service:  Endoscopy;  Laterality: N/A;   LAPAROSCOPIC HYSTERECTOMY     MANDIBLE FRACTURE SURGERY     RADIOLOGY WITH ANESTHESIA N/A 08/21/2013   Procedure: RADIOLOGY WITH ANESTHESIA;  Surgeon: Oneal Grout, MD;  Location: [facility] OR;  Service: Radiology;  Laterality: N/A;   stones     no surgery    Family Psychiatric History: Denies  Family History:  Family History  Problem Relation Age of Onset   Heart disease Mother    Heart disease Father    Heart attack Father    Cancer Father        prostate to lymph node   Breast cancer Maternal Aunt    Breast cancer Cousin    Breast cancer Cousin    Lung cancer Other     Social History:  Social History   Socioeconomic History   Marital status: Married    Spouse name: Windy Fast   Number of children: 2   Years of education: 14   Highest education level: Not on file  Occupational History   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    Drug use: No   Sexual activity: Not Currently  Other Topics Concern  Not on file  Social History Narrative   Patient lives at home with her husband Windy Fast).   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    Patient is on disability.    Patient has an Associates degree.   Patient is right-handed.   Patient drinks 2 cups of green tea most days.               Social Determinants of Health   Financial Resource Strain: Low Risk   (09/28/2022)   Overall Financial Resource Strain (CARDIA)    Difficulty of Paying Living Expenses: Not hard at all  Food Insecurity: No Food Insecurity (09/28/2022)   Hunger Vital Sign    Worried About Running Out of Food in the Last Year: Never true    Ran Out of Food in the Last Year: Never true  Transportation Needs: No Transportation Needs (09/28/2022)   PRAPARE - Administrator, Civil Service (Medical): No    Lack of Transportation (Non-Medical): No  Physical Activity: Inactive (09/28/2022)   Exercise Vital Sign    Days of Exercise per Week: 0 days    Minutes of Exercise per Session: 0 min  Stress: Stress Concern Present (09/28/2022)   [employer] of Occupational Health - Occupational Stress Questionnaire    Feeling of Stress : To some extent  Social Connections: Not on file    Allergies:  Allergies  Allergen Reactions   Phenobarbital Hives, Shortness Of Breath and Swelling   Phenytoin Anaphylaxis, Hives and Swelling    Current Medications: Current Outpatient Medications  Medication Sig Dispense Refill   albuterol (PROVENTIL) (2.5 MG/3ML) 0.083% nebulizer solution Take 3 mLs (2.5 mg total) by nebulization every 6 (six) hours. 1080 mL 1   albuterol (VENTOLIN HFA) 108 (90 Base) MCG/ACT inhaler Inhale 2 puffs into the lungs every 6 (six) hours as needed for wheezing or shortness of breath. 24 g 3   aspirin EC 81 MG tablet Take 81 mg by mouth daily.     Biotin 1 MG CAPS Take 1 tablet by mouth 2 (two) times daily. Viviscal     Calcium-Magnesium-Zinc (CAL-MAG-ZINC PO) Take 1 tablet by mouth daily.     Cholecalciferol (VITAMIN D-3) 1000 units CAPS Take 2,000 Units by mouth at bedtime.     clonazePAM (KLONOPIN) 0.5 MG tablet Take 0.5 tablets (0.25 mg total) by mouth daily as needed for anxiety. 15 tablet 1   clopidogrel (PLAVIX) 75 MG tablet TAKE 1 TABLET DAILY 90 tablet 3   diclofenac Sodium (VOLTAREN) 1 % GEL Voltaren 1 % topical gel  APPLY 2 GRAM TO THE AFFECTED  AREA(S) BY TOPICAL ROUTE 4 TIMES PER DAY 350 g 2   escitalopram (LEXAPRO) 20 MG tablet Take 1 tablet (20 mg total) by mouth at bedtime. 90 tablet 1   fluticasone (FLONASE) 50 MCG/ACT nasal spray Place 2 sprays into both nostrils daily as needed (allergies or post-nasl drip).  3   levETIRAcetam (KEPPRA) 500 MG tablet Take 1 tablet (500 mg total) by mouth every 12 (twelve) hours. 180 tablet 3   levothyroxine (SYNTHROID) 50 MCG tablet TAKE 1 TABLET ON SATURDAY AND SUNDAY 26 tablet 3   levothyroxine (SYNTHROID) 75 MCG tablet TAKE 1 TABLET IN THE MORNING BEFORE BREAKFAST MONDAY THROUGH FRIDAY (TAKE 50 MCG ON OTHER DAYS) 51 tablet 4   loratadine (CLARITIN) 10 MG tablet Take 1 tablet (10 mg total) by mouth daily. 30 tablet 2  Multiple Vitamins-Minerals (PRESERVISION/LUTEIN PO) Take 1 tablet by mouth daily.     NONFORMULARY OR COMPOUNDED ITEM Apply 240 mg topically daily as needed. 1 each 3   Omega-3 Fatty Acids (FISH OIL) 1000 MG CAPS Take 2,000 mg by mouth 2 (two) times daily.      Polyethyl Glycol-Propyl Glycol (SYSTANE) 0.4-0.3 % SOLN Place 1 drop into both eyes daily as needed (dry eyes).     Psyllium (FIBER) 28.3 % POWD Take 10 mLs by mouth 2 (two) times daily.     rosuvastatin (CRESTOR) 20 MG tablet TAKE 1 TABLET DAILY (REPLACES 10 MG DOSE) 90 tablet 3   telmisartan (MICARDIS) 20 MG tablet Take 1 tablet (20 mg total) by mouth daily. 90 tablet 3   tiZANidine (ZANAFLEX) 4 MG tablet TAKE 1 TABLET EVERY EVENING AS NEEDED 60 tablet 5   traZODone (DESYREL) 50 MG tablet Take 1.5 tablets (75 mg total) by mouth at bedtime as needed for sleep. 135 tablet 1   vitamin C (ASCORBIC ACID) 500 MG tablet Take 1,000 mg by mouth at bedtime. Rose hips     No current facility-administered medications for this visit.     Musculoskeletal: Strength & Muscle Tone: decreased and right hemiparesis secondary to past stroke Gait & Station: unsteady Patient leans: Left  Psychiatric Specialty Exam: Review of Systems   There were no vitals taken for this visit.There is no height or weight on file to calculate BMI.  General Appearance: Well Groomed  Eye Contact:  Good  Speech:  Clear and Coherent and Normal Rate  Volume:  Normal  Mood:  Anxious and Depressed  Affect:  Congruent and Tearful  Thought Process:  Coherent  Orientation:  Full (Time, Place, and Person)  Thought Content: Logical, Illogical, and Rumination   Suicidal Thoughts:  No  Homicidal Thoughts:  No  Memory:  Immediate;   Fair  Judgement:  Good  Insight:  Good  Psychomotor Activity:  Normal  Concentration:  Concentration: Good  Recall:  Fair  Fund of Knowledge: Fair  Language: Good  Akathisia:  NA    AIMS (if indicated): not done  Assets:  Communication Skills Desire for Improvement Housing Intimacy Resilience Social Support Transportation Vocational/Educational  ADL's:  Intact  Cognition: WNL  Sleep:  Fair   Metabolic Disorder Labs: Lab Results  Component Value Date   HGBA1C 5.7 (H) 03/23/2023   No results found for: "PROLACTIN" Lab Results  Component Value Date   CHOL 149 03/23/2023   TRIG 103 03/23/2023   HDL 74 03/23/2023   CHOLHDL 2.0 03/23/2023   VLDL 15 11/23/2009   LDLCALC 57 03/23/2023   LDLCALC 35 05/18/2022   Lab Results  Component Value Date   TSH 0.824 03/23/2023   TSH 1.500 09/28/2022    Therapeutic Level Labs: No results found for: "LITHIUM" No results found for: "VALPROATE" No results found for: "CBMZ"   Screenings: GAD-7    Flowsheet Row [facility]  Total GAD-7 Score 16      Mini-Mental    Flowsheet Row Clinical Support from 09/03/2018 in [facility] Office Visit from 11/03/2015 in [facility] Office Visit from 05/05/2015 in [facility] Office Visit from 11/05/2014 in [facility]   Total Score (max 30 points ) 29 27 26 28       PHQ2-9    Flowsheet Row Office Visit from 03/23/2023 in [facility] Office Visit from 11/21/2022 in [facility] Clinical Support from 09/28/2022 in [facility] Office Visit from 07/04/2022 in [facility] Office Visit from 05/18/2022 in [facility]  PHQ-2 Total Score 4 0 0 5 6  PHQ-9 Total Score 9 5 -- 19 17      Flowsheet Row ED from 08/09/2022 in [facility] Office Visit from 07/04/2022 in [facility] Admission (Discharged) from 09/09/2021 in [facility]  C-SSRS RISK CATEGORY No Risk No Risk No Risk       Collaboration of Care: Collaboration of Care: Medication Management AEB medication prescription and Other provider involved in patient's care AEB ophthalmology chart review  Patient/Guardian was advised Release of Information must be obtained prior to any record release in order to collaborate their care with an outside provider. Patient/Guardian was advised if they have not already done so to contact the registration department to sign all necessary forms in order for [place] to release information regarding their care.   Consent: Patient/Guardian gives verbal consent for treatment and assignment of benefits for services provided during this visit. Patient/Guardian expressed understanding and agreed to proceed.    Stasia Cavalier, MD 07/11/2023, 3:12 PM

## 2023-07-11 ENCOUNTER — Ambulatory Visit (HOSPITAL_BASED_OUTPATIENT_CLINIC_OR_DEPARTMENT_OTHER): Payer: Medicare Other | Admitting: Psychiatry

## 2023-07-11 DIAGNOSIS — F331 Major depressive disorder, recurrent, moderate: Secondary | ICD-10-CM | POA: Diagnosis not present

## 2023-07-11 DIAGNOSIS — F411 Generalized anxiety disorder: Secondary | ICD-10-CM | POA: Diagnosis not present

## 2023-07-11 MED ORDER — ESCITALOPRAM OXALATE 10 MG PO TABS
10.0000 mg | ORAL_TABLET | Freq: Every day | ORAL | 1 refills | Status: DC
Start: 2023-07-11 — End: 2024-01-15

## 2023-07-12 ENCOUNTER — Encounter (HOSPITAL_COMMUNITY): Payer: Self-pay | Admitting: Psychiatry

## 2023-07-19 DIAGNOSIS — M6281 Muscle weakness (generalized): Secondary | ICD-10-CM | POA: Diagnosis not present

## 2023-07-19 DIAGNOSIS — N3946 Mixed incontinence: Secondary | ICD-10-CM | POA: Diagnosis not present

## 2023-07-19 DIAGNOSIS — R278 Other lack of coordination: Secondary | ICD-10-CM | POA: Diagnosis not present

## 2023-07-25 ENCOUNTER — Ambulatory Visit (INDEPENDENT_AMBULATORY_CARE_PROVIDER_SITE_OTHER): Payer: Medicare Other | Admitting: Internal Medicine

## 2023-07-25 VITALS — BP 120/82 | HR 67 | Temp 98.7°F | Ht 62.0 in | Wt 153.0 lb

## 2023-07-25 DIAGNOSIS — I119 Hypertensive heart disease without heart failure: Secondary | ICD-10-CM | POA: Diagnosis not present

## 2023-07-25 DIAGNOSIS — R296 Repeated falls: Secondary | ICD-10-CM

## 2023-07-25 DIAGNOSIS — I700 Atherosclerosis of aorta: Secondary | ICD-10-CM | POA: Diagnosis not present

## 2023-07-25 DIAGNOSIS — Z87891 Personal history of nicotine dependence: Secondary | ICD-10-CM

## 2023-07-25 DIAGNOSIS — R7309 Other abnormal glucose: Secondary | ICD-10-CM | POA: Diagnosis not present

## 2023-07-25 NOTE — Progress Notes (Signed)
I,Jameka J Llittleton, CMA,acting as a Neurosurgeon for Gwynneth Aliment, MD.,have documented all relevant documentation on the behalf of Gwynneth Aliment, MD,as directed by  Gwynneth Aliment, MD while in the presence of Gwynneth Aliment, MD.  Subjective:  Patient ID: [name] , female    DOB: 1956/04/07 , 67 y.o.   MRN: [id number]  Chief Complaint  Patient presents with   Hypertension    HPI  She presents today for BP and chol check.  She reports compliance with meds.  She reports compliance with medication daily.  She denies having any dizziness, palpitations, and chest pain. She admits she has had several falls sine her last visit. She usually walks with a cane, may not always use in her home.        Hypertension This is a chronic problem. The current episode started more than 1 year ago. The problem has been gradually improving since onset. The problem is controlled. Pertinent negatives include no blurred vision, chest pain, palpitations or shortness of breath. Risk factors for coronary artery disease include post-menopausal state and sedentary lifestyle. The current treatment provides moderate improvement. Compliance problems include exercise.      Past Medical History:  Diagnosis Date   Abnormal gait    Amnesia    Anemia    hx   Aneurysm (HCC)    Anxiety    Asthma    Atrophic vaginitis    Benign hypertensive heart disease    Cardiomegaly    Chest pain    COPD (chronic obstructive pulmonary disease) (HCC)     - PFTs  01/24/06 FEV1 69% ratio 68% diffusing capacity 57% with no improvement after B2   - PFT's 10/1/ 09 FEV1 77   ratio  76    dlco                   45            no resp to B2    - Nl alpha one antitripsin level 10/09   Cough    Depression    Difficulty speaking    Disorder of nasal cavity    Dyspnea    Enthesopathy of elbow region    GERD (gastroesophageal reflux disease)    Hammer toe    Headache(784.0)    Hemiparesis affecting dominant side as late effect  of cerebrovascular accident (HCC)    History of kidney stones    Hyperlipidemia    Hypertension    Hypothyroidism    Increased urinary frequency    Low compliance bladder    Macular degeneration of both eyes    Malaise and fatigue    Nonruptured cerebral aneurysm    Obesity    Pneumonia    hx   Pure hypercholesterolemia    Recurrent major depressive episodes (HCC)    Seizure disorder, secondary (HCC)    2-3 focal siezures monthly   Seizures (HCC)    Short-term memory loss    since stroke   Skin sensation disturbance    Stroke (HCC) 2011   rt sided weakness   Swelling of limb    TMJ (temporomandibular joint disorder)    Urinary tract infectious disease    Vertigo as late effect of stroke    Vitamin D deficiency      Family History  Problem Relation Age of Onset   Heart disease Mother    Heart disease Father    Heart attack Father    Cancer  Father        prostate to lymph node   Breast cancer Maternal Aunt    Breast cancer Cousin    Breast cancer Cousin    Lung cancer Other      Current Outpatient Medications:    albuterol (PROVENTIL) (2.5 MG/3ML) 0.083% nebulizer solution, Take 3 mLs (2.5 mg total) by nebulization every 6 (six) hours., Disp: 1080 mL, Rfl: 1   albuterol (VENTOLIN HFA) 108 (90 Base) MCG/ACT inhaler, Inhale 2 puffs into the lungs every 6 (six) hours as needed for wheezing or shortness of breath., Disp: 24 g, Rfl: 3   aspirin EC 81 MG tablet, Take 81 mg by mouth daily., Disp: , Rfl:    Biotin 1 MG CAPS, Take 1 tablet by mouth 2 (two) times daily. Viviscal, Disp: , Rfl:    Calcium-Magnesium-Zinc (CAL-MAG-ZINC PO), Take 1 tablet by mouth daily., Disp: , Rfl:    Cholecalciferol (VITAMIN D-3) 1000 units CAPS, Take 2,000 Units by mouth at bedtime., Disp: , Rfl:    clopidogrel (PLAVIX) 75 MG tablet, TAKE 1 TABLET DAILY, Disp: 90 tablet, Rfl: 3   diclofenac Sodium (VOLTAREN) 1 % GEL, Voltaren 1 % topical gel  APPLY 2 GRAM TO THE AFFECTED AREA(S) BY TOPICAL  ROUTE 4 TIMES PER DAY, Disp: 350 g, Rfl: 2   escitalopram (LEXAPRO) 10 MG tablet, Take 1 tablet (10 mg total) by mouth at bedtime., Disp: 90 tablet, Rfl: 1   fluticasone (FLONASE) 50 MCG/ACT nasal spray, Place 2 sprays into both nostrils daily as needed (allergies or post-nasl drip)., Disp: , Rfl: 3   levETIRAcetam (KEPPRA) 500 MG tablet, Take 1 tablet (500 mg total) by mouth every 12 (twelve) hours., Disp: 180 tablet, Rfl: 3   levothyroxine (SYNTHROID) 50 MCG tablet, TAKE 1 TABLET ON SATURDAY AND SUNDAY, Disp: 26 tablet, Rfl: 3   levothyroxine (SYNTHROID) 75 MCG tablet, TAKE 1 TABLET IN THE MORNING BEFORE BREAKFAST MONDAY THROUGH FRIDAY (TAKE 50 MCG ON OTHER DAYS), Disp: 51 tablet, Rfl: 4   Multiple Vitamins-Minerals (PRESERVISION/LUTEIN PO), Take 1 tablet by mouth daily., Disp: , Rfl:    NONFORMULARY OR COMPOUNDED ITEM, Apply 240 mg topically daily as needed., Disp: 1 each, Rfl: 3   Omega-3 Fatty Acids (FISH OIL) 1000 MG CAPS, Take 2,000 mg by mouth 2 (two) times daily. , Disp: , Rfl:    Polyethyl Glycol-Propyl Glycol (SYSTANE) 0.4-0.3 % SOLN, Place 1 drop into both eyes daily as needed (dry eyes)., Disp: , Rfl:    Psyllium (FIBER) 28.3 % POWD, Take 10 mLs by mouth 2 (two) times daily., Disp: , Rfl:    rosuvastatin (CRESTOR) 20 MG tablet, TAKE 1 TABLET DAILY (REPLACES 10 MG DOSE), Disp: 90 tablet, Rfl: 3   telmisartan (MICARDIS) 20 MG tablet, Take 1 tablet (20 mg total) by mouth daily., Disp: 90 tablet, Rfl: 3   tiZANidine (ZANAFLEX) 4 MG tablet, TAKE 1 TABLET EVERY EVENING AS NEEDED, Disp: 60 tablet, Rfl: 5   traZODone (DESYREL) 50 MG tablet, Take 1.5 tablets (75 mg total) by mouth at bedtime as needed for sleep., Disp: 135 tablet, Rfl: 1   vitamin C (ASCORBIC ACID) 500 MG tablet, Take 1,000 mg by mouth at bedtime. Rose hips, Disp: , Rfl:    clonazePAM (KLONOPIN) 0.5 MG tablet, Take 0.5 tablets (0.25 mg total) by mouth daily as needed for anxiety., Disp: 15 tablet, Rfl: 1   loratadine (CLARITIN)  10 MG tablet, Take 1 tablet (10 mg total) by mouth daily., Disp: 30 tablet, Rfl:  2   Allergies  Allergen Reactions   Phenobarbital Hives, Shortness Of Breath and Swelling   Phenytoin Anaphylaxis, Hives and Swelling     Review of Systems  Constitutional: Negative.   Eyes: Negative.  Negative for blurred vision.  Respiratory: Negative.  Negative for shortness of breath.   Cardiovascular: Negative.  Negative for chest pain and palpitations.  Gastrointestinal: Negative.   Musculoskeletal: Negative.   Skin: Negative.   Psychiatric/Behavioral: Negative.       Today's Vitals   07/25/23 1134 07/25/23 1213  BP: (!) 144/90 120/82  Pulse: 67   Temp: 98.7 F (37.1 C)   Weight: 153 lb (69.4 kg)   Height: 5\' 2"  (1.575 m)   PainSc: 5    PainLoc: Back    Body mass index is 27.98 kg/m.  Wt Readings from Last 3 Encounters:  07/25/23 153 lb (69.4 kg)  03/23/23 156 lb 3.2 oz (70.9 kg)  02/02/23 152 lb 12.8 oz (69.3 kg)    The ASCVD Risk score (Arnett DK, et al., 2019) failed to calculate for the following reasons:   The patient has a prior MI or stroke diagnosis  Objective:  Physical Exam Vitals and nursing note reviewed.  Constitutional:      Appearance: Normal appearance.  HENT:     Head: Normocephalic and atraumatic.  Eyes:     Extraocular Movements: Extraocular movements intact.  Cardiovascular:     Rate and Rhythm: Normal rate and regular rhythm.     Heart sounds: Normal heart sounds.  Pulmonary:     Effort: Pulmonary effort is normal.     Breath sounds: Normal breath sounds.  Musculoskeletal:     Cervical back: Normal range of motion.     Comments: Ambulatory with cane  Skin:    General: Skin is warm.  Neurological:     General: No focal deficit present.     Mental Status: She is alert.     Comments: RLE spasticity  Psychiatric:        Mood and Affect: Mood normal.        Behavior: Behavior normal.         Assessment And Plan:  Hypertensive heart disease  without heart failure Assessment & Plan: Chronic, well controlled.  She will c/w telmisartan 20mg  daily. She is encouraged to follow a low sodium diet. I will check renal function today.   Orders: -     BMP8+eGFR  Atherosclerosis of aorta [facility]) Assessment & Plan: Incidental finding on CT A/P performed 11/06/2018--[facility]. LDL goal < 70.  She will c/w rosuvastatin 20mg  and ASA 81mg  daily.    Other abnormal glucose Assessment & Plan: Previous labs reviewed, her A1c has been elevated in the past. I will check an A1c today. Reminded to avoid refined sugars including sugary drinks/foods and processed meats including bacon, sausages and deli meats.    Orders: -     Hemoglobin A1c  Frequent falls Assessment & Plan: She is ambulatory with 4-prong cane. She is encouraged to always wear closed toe shoes and to remove all throw rugs, electrical cords etc in her home.    History of tobacco use Assessment & Plan: 2023 LDCT results reviewed in detail. She is due now for repeat study. She is agreeable to referral.   Orders: -     CT CHEST LUNG CANCER SCREENING LOW DOSE WO CONTRAST; Future    Return pls move RS Feb appt to March.  Patient was given opportunity to ask questions. Patient  verbalized understanding of the plan and was able to repeat key elements of the plan. All questions were answered to their satisfaction.    I, Gwynneth Aliment, MD, have reviewed all documentation for this visit. The documentation on 07/25/23 for the exam, diagnosis, procedures, and orders are all accurate and complete.   IF YOU HAVE BEEN REFERRED TO A SPECIALIST, IT MAY TAKE 1-2 WEEKS TO SCHEDULE/PROCESS THE REFERRAL. IF YOU HAVE NOT HEARD FROM US/SPECIALIST IN TWO WEEKS, PLEASE GIVE [place] A CALL AT [phone].

## 2023-07-26 LAB — BMP8+EGFR
BUN/Creatinine Ratio: 20 (ref 12–28)
BUN: 13 mg/dL (ref 8–27)
CO2: 27 mmol/L (ref 20–29)
Calcium: 9.5 mg/dL (ref 8.7–10.3)
Chloride: 104 mmol/L (ref 96–106)
Creatinine, Ser: 0.64 mg/dL (ref 0.57–1.00)
Glucose: 94 mg/dL (ref 70–99)
Potassium: 4.1 mmol/L (ref 3.5–5.2)
Sodium: 145 mmol/L — ABNORMAL HIGH (ref 134–144)
eGFR: 97 mL/min/{1.73_m2} (ref >59)

## 2023-07-26 LAB — HEMOGLOBIN A1C
Est. average glucose Bld gHb Est-mCnc: 120 mg/dL
Hgb A1c MFr Bld: 5.8 % — ABNORMAL HIGH (ref 4.8–5.6)

## 2023-08-03 DIAGNOSIS — R296 Repeated falls: Secondary | ICD-10-CM | POA: Insufficient documentation

## 2023-08-03 DIAGNOSIS — Z87891 Personal history of nicotine dependence: Secondary | ICD-10-CM | POA: Insufficient documentation

## 2023-08-03 NOTE — Assessment & Plan Note (Signed)
2023 LDCT results reviewed in detail. She is due now for repeat study. She is agreeable to referral.

## 2023-08-03 NOTE — Assessment & Plan Note (Addendum)
Incidental finding on CT A/P performed 11/06/2018--[facility]. LDL goal < 70.  She will c/w rosuvastatin 20mg  and ASA 81mg  daily.

## 2023-08-03 NOTE — Assessment & Plan Note (Signed)
Chronic, well controlled.  She will c/w telmisartan 20mg  daily. She is encouraged to follow a low sodium diet. I will check renal function today.

## 2023-08-03 NOTE — Assessment & Plan Note (Signed)
Previous labs reviewed, her A1c has been elevated in the past. I will check an A1c today. Reminded to avoid refined sugars including sugary drinks/foods and processed meats including bacon, sausages and deli meats.

## 2023-08-03 NOTE — Assessment & Plan Note (Signed)
She is ambulatory with 4-prong cane. She is encouraged to always wear closed toe shoes and to remove all throw rugs, electrical cords etc in her home.

## 2023-08-16 ENCOUNTER — Ambulatory Visit
Admission: RE | Admit: 2023-08-16 | Discharge: 2023-08-16 | Disposition: A | Discharge status: Home / Self Care | Payer: Medicare Other | Source: Ambulatory Visit | Attending: Internal Medicine | Admitting: Internal Medicine

## 2023-08-16 DIAGNOSIS — Z87891 Personal history of nicotine dependence: Secondary | ICD-10-CM

## 2023-08-21 NOTE — Progress Notes (Signed)
BH MD/PA/NP OP Progress Note  08/22/2023 12:49 PM [name]  MRN:  [id number]  Visit Diagnosis:    ICD-10-CM   1. GAD (generalized anxiety disorder)  F41.1 clonazePAM (KLONOPIN) 0.5 MG tablet    2. Moderate episode of recurrent major depressive disorder (HCC)  F33.1 clonazePAM (KLONOPIN) 0.5 MG tablet     Assessment: [name] is a 67 y.o. 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    During initial evaluation patient reported symptoms of depression including anhedonia, feelings of hopelessness, increased fatigue, decreased appetite, difficulty sleeping, difficulty concentrating, and feelings of worthlessness.  She denied any SI/HI or thoughts of self-harm along with any past history of mania or psychosis.  Patient also endorsed symptoms of anxiety including feeling nervous or on edge, being unable to control her worrying, worrying too much with different things, difficulty relaxing, and irritability.  Of note patient had a left frontoparietal parenchymal hemorrhage in March 2011 which correlated with the onset of her cognitive impairment, onset of seizures, and onset of depressive symptoms.  Patient was started on Keppra around that time and we discussed the possibility that it could be contributing to her increased irritability/agitation.    [name] presents for follow-up evaluation. Today, 08/22/23, patient reports continued grief with depression, irritability, and anger related to the passing of her son.  Supportive and motivational interviewing techniques were used around this.  Patient tolerated the decrease in Lexapro well and denied any adverse side effects.  She did not increase the trazodone though notes sleep improved with adjustments to sleep hygiene.  Patient has had some  increase in falls recently and reported that she had not taken the Klonopin daily.  Reminded her that the medication is to be used as needed and she plans to take it out of her daily pill container.  Patient is agreeable to working to taper off the medication quality.  We will continue on her current regimen of follow-up in 6 weeks  Plan: - Continue Lexapro (escitalopram) to 10 mg at bedtime  - Continue trazodone to 50-100 mg at bedtime as needed - Continue Klonopin 0.25 QD prn for anxiety - EKG reviewed QTC 462 - Discuss Keppra with neurologist - Continue Grief counseling - CBC, CMP, lipid profile, Vit D, and TSH reviewed - Follow up in 6 weeks  Chief Complaint:  Chief Complaint  Patient presents with   Follow-up   HPI: [name] presents reporting that she is feeling a bit stressed. Thanksgiving went well and she did not get depressed, but she is worried about Christmas and dealing with the grief from the loss of her son.  She decided not to do a sit down meal this year as some of the stuff she used to cook reminds her of him.  We provided support around this and encouraged patient to focus on the positives of her son's life.  Of note patient does have some residual anger and frustration related to her son's passing.  She was encouraged to continue with grief counseling.  Her daughter has now gotten engaged with a wedding set for March to a guy she has only known for a few months now which has [name] worried. She is hopeful that this partner will be more stable then some of her daughters previous ones. In addition it will leave the trailer they just  bought unoccupied which is a bit of a stressor. Initially the plan was for her daughter to gradually pay it off.   [name] notes that she had another fall in the interim where her leg just gave out. She did hit her head though denies any change in headache, vision, or orientation.  Patient reports that she has been taking the Klonopin daily since the  beginning of the month in part due to the holidays being more difficult time.  That being said there are some days she does not necessarily need the medication and it may be contributing to her decreased concentration, memory, and balance issues.  We suggested taking it out of her pill container and only taking it on the days that she needs it.  After the holidays we will plan to work on tapering off the Klonopin.  Any concerns with the remainder of her medication regimen and tolerated the decrease in Lexapro well.  Past Psychiatric History: Patient reports that her psychiatric symptoms/depression started after 2011 when she had her stroke prior to that she reports minimal to no depressive symptoms.  She has been on Celexa for as long as she can remember post stroke and believes she was started on Xanax at the same time.  Patient denies any prior psychiatric hospitalizations or past suicide attempts.  She denies any current substance use since 2005 and reports drinking alcohol when she was younger.  Past Medical History:  Past Medical History:  Diagnosis Date   Abnormal gait    Amnesia    Anemia    hx   Aneurysm (HCC)    Anxiety    Asthma    Atrophic vaginitis    Benign hypertensive heart disease    Cardiomegaly    Chest pain    COPD (chronic obstructive pulmonary disease) (HCC)     - PFTs  01/24/06 FEV1 69% ratio 68% diffusing capacity 57% with no improvement after B2   - PFT's 10/1/ 09 FEV1 77   ratio  76    dlco                   45            no resp to B2    - Nl alpha one antitripsin level 10/09   Cough    Depression    Difficulty speaking    Disorder of nasal cavity    Dyspnea    Enthesopathy of elbow region    GERD (gastroesophageal reflux disease)    Hammer toe    Headache(784.0)    Hemiparesis affecting dominant side as late effect of cerebrovascular accident (HCC)    History of kidney stones    Hyperlipidemia    Hypertension    Hypothyroidism    Increased urinary  frequency    Low compliance bladder    Macular degeneration of both eyes    Malaise and fatigue    Nonruptured cerebral aneurysm    Obesity    Pneumonia    hx   Pure hypercholesterolemia    Recurrent major depressive episodes (HCC)    Seizure disorder, secondary (HCC)    2-3 focal siezures monthly   Seizures (HCC)    Short-term memory loss    since stroke   Skin sensation disturbance    Stroke [facility]) 2011   rt sided weakness   Swelling of limb    TMJ (temporomandibular joint disorder)    Urinary tract infectious disease    Vertigo as late effect  of stroke    Vitamin D deficiency     Past Surgical History:  Procedure Laterality Date   ANTERIOR CERVICAL DECOMP/DISCECTOMY FUSION N/A 09/09/2021   Procedure: Anterior  Cervical Decompression Fusion  Cervical four-five, Cervical five-six;  Surgeon: Bethann Goo, DO;  Location: [facility] OR;  Service: Neurosurgery;  Laterality: N/A;   BACK SURGERY     CARPAL TUNNEL RELEASE Left 09/09/2021   Procedure: LEFT CARPAL TUNNEL RELEASE;  Surgeon: Bethann Goo, DO;  Location: [facility] OR;  Service: Neurosurgery;  Laterality: Left;   CEREBRAL ANGIOGRAM     x3   COLONOSCOPY WITH PROPOFOL N/A 08/18/2016   Procedure: COLONOSCOPY WITH PROPOFOL;  Surgeon: Jeani Hawking, MD;  Location: [facility] END[facility]COPY;  Service: Endoscopy;  Laterality: N/A;   LAPAROSCOPIC HYSTERECTOMY     MANDIBLE FRACTURE SURGERY     RADIOLOGY WITH ANESTHESIA N/A 08/21/2013   Procedure: RADIOLOGY WITH ANESTHESIA;  Surgeon: Oneal Grout, MD;  Location: [facility] OR;  Service: Radiology;  Laterality: N/A;   stones     no surgery    Family Psychiatric History: Denies  Family History:  Family History  Problem Relation Age of Onset   Heart disease Mother    Heart disease Father    Heart attack Father    Cancer Father        prostate to lymph node   Breast cancer Maternal Aunt    Breast cancer Cousin    Breast cancer Cousin    Lung cancer Other     Social History:  Social History    Socioeconomic History   Marital status: Married    Spouse name: Windy Fast   Number of children: 2   Years of education: 14   Highest education level: Associate degree: academic program  Occupational History   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    Drug use: No   Sexual activity: Not Currently  Other Topics Concern   Not on file  Social History Narrative   Patient lives at home with her husband Windy Fast).   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    Patient is on disability.    Patient has an Associates degree.   Patient is right-handed.   Patient drinks 2 cups of green tea most days.               Social Drivers of Corporate investment banker Strain: Low Risk  (07/25/2023)   Overall Financial Resource Strain (CARDIA)    Difficulty of Paying Living Expenses: Not hard at all  Food Insecurity: No Food Insecurity (07/25/2023)   Hunger Vital Sign    Worried About Running Out of Food in the Last Year: Never true    Ran Out of Food in the Last Year: Never true  Transportation Needs:  No Transportation Needs (07/25/2023)   PRAPARE - Administrator, Civil Service (Medical): No    Lack of Transportation (Non-Medical): No  Physical Activity: Sufficiently Active (07/25/2023)   Exercise Vital Sign    Days of Exercise per Week: 4 days    Minutes of Exercise per Session: 60 min  Stress: Stress  Concern Present (07/25/2023)   [employer] of Occupational Health - Occupational Stress Questionnaire    Feeling of Stress : Rather much  Social Connections: Socially Integrated (07/25/2023)   Social Connection and Isolation Panel [NHANES]    Frequency of Communication with Friends and Family: More than three times a week    Frequency of Social Gatherings with Friends and Family: Three times a week    Attends Religious Services: More than 4 times per year    Active Member of Clubs or Organizations: Yes    Attends Banker Meetings: More than 4 times per year    Marital Status: Married    Allergies:  Allergies  Allergen Reactions   Phenobarbital Hives, Shortness Of Breath and Swelling   Phenytoin Anaphylaxis, Hives and Swelling    Current Medications: Current Outpatient Medications  Medication Sig Dispense Refill   albuterol (PROVENTIL) (2.5 MG/3ML) 0.083% nebulizer solution Take 3 mLs (2.5 mg total) by nebulization every 6 (six) hours. 1080 mL 1   albuterol (VENTOLIN HFA) 108 (90 Base) MCG/ACT inhaler Inhale 2 puffs into the lungs every 6 (six) hours as needed for wheezing or shortness of breath. 24 g 3   aspirin EC 81 MG tablet Take 81 mg by mouth daily.     Biotin 1 MG CAPS Take 1 tablet by mouth 2 (two) times daily. Viviscal     Calcium-Magnesium-Zinc (CAL-MAG-ZINC PO) Take 1 tablet by mouth daily.     Cholecalciferol (VITAMIN D-3) 1000 units CAPS Take 2,000 Units by mouth at bedtime.     clonazePAM (KLONOPIN) 0.5 MG tablet Take 0.5 tablets (0.25 mg total) by mouth daily as needed for anxiety. 15 tablet 1   clopidogrel (PLAVIX) 75 MG tablet TAKE 1 TABLET DAILY 90 tablet 3   diclofenac Sodium (VOLTAREN) 1 % GEL Voltaren 1 % topical gel  APPLY 2 GRAM TO THE AFFECTED AREA(S) BY TOPICAL ROUTE 4 TIMES PER DAY 350 g 2   escitalopram (LEXAPRO) 10 MG tablet Take 1 tablet (10 mg total) by mouth at bedtime. 90 tablet 1   fluticasone (FLONASE) 50 MCG/ACT nasal spray Place 2  sprays into both nostrils daily as needed (allergies or post-nasl drip).  3   levETIRAcetam (KEPPRA) 500 MG tablet Take 1 tablet (500 mg total) by mouth every 12 (twelve) hours. 180 tablet 3   levothyroxine (SYNTHROID) 50 MCG tablet TAKE 1 TABLET ON SATURDAY AND SUNDAY 26 tablet 3   levothyroxine (SYNTHROID) 75 MCG tablet TAKE 1 TABLET IN THE MORNING BEFORE BREAKFAST MONDAY THROUGH FRIDAY (TAKE 50 MCG ON OTHER DAYS) 51 tablet 4   loratadine (CLARITIN) 10 MG tablet Take 1 tablet (10 mg total) by mouth daily. 30 tablet 2   Multiple Vitamins-Minerals (PRESERVISION/LUTEIN PO) Take 1 tablet by mouth daily.     NONFORMULARY OR COMPOUNDED ITEM Apply 240 mg topically daily as needed. 1 each 3   Omega-3 Fatty Acids (FISH OIL) 1000 MG CAPS Take 2,000 mg by mouth 2 (two) times daily.      Polyethyl Glycol-Propyl Glycol (SYSTANE) 0.4-0.3 % SOLN Place 1 drop into both eyes daily as needed (dry eyes).     Psyllium (  FIBER) 28.3 % POWD Take 10 mLs by mouth 2 (two) times daily.     rosuvastatin (CRESTOR) 20 MG tablet TAKE 1 TABLET DAILY (REPLACES 10 MG DOSE) 90 tablet 3   telmisartan (MICARDIS) 20 MG tablet Take 1 tablet (20 mg total) by mouth daily. 90 tablet 3   tiZANidine (ZANAFLEX) 4 MG tablet TAKE 1 TABLET EVERY EVENING AS NEEDED 60 tablet 5   traZODone (DESYREL) 50 MG tablet Take 1.5 tablets (75 mg total) by mouth at bedtime as needed for sleep. 135 tablet 1   vitamin C (ASCORBIC ACID) 500 MG tablet Take 1,000 mg by mouth at bedtime. Rose hips     No current facility-administered medications for this visit.     Musculoskeletal: Strength & Muscle Tone: decreased and right hemiparesis secondary to past stroke Gait & Station: unsteady Patient leans: Left  Psychiatric Specialty Exam: Review of Systems  Blood pressure 111/73, pulse 69, height 5\' 2"  (1.575 m), weight 153 lb (69.4 kg).Body mass index is 27.98 kg/m.  General Appearance: Well Groomed  Eye Contact:  Good  Speech:  Clear and Coherent and  Normal Rate  Volume:  Normal  Mood:   mildly depressed  Affect:  Congruent  Thought Process:  Coherent  Orientation:  Full (Time, Place, and Person)  Thought Content: Logical, Illogical, and Rumination   Suicidal Thoughts:  No  Homicidal Thoughts:  No  Memory:  Immediate;   Fair  Judgement:  Good  Insight:  Good  Psychomotor Activity:  Normal  Concentration:  Concentration: Good  Recall:  Fair  Fund of Knowledge: Fair  Language: Good  Akathisia:  NA    AIMS (if indicated): not done  Assets:  Communication Skills Desire for Improvement Housing Intimacy Resilience Social Support Transportation Vocational/Educational  ADL's:  Intact  Cognition: WNL  Sleep:  Fair   Metabolic Disorder Labs: Lab Results  Component Value Date   HGBA1C 5.8 (H) 07/25/2023   No results found for: "PROLACTIN" Lab Results  Component Value Date   CHOL 149 03/23/2023   TRIG 103 03/23/2023   HDL 74 03/23/2023   CHOLHDL 2.0 03/23/2023   VLDL 15 11/23/2009   LDLCALC 57 03/23/2023   LDLCALC 35 05/18/2022   Lab Results  Component Value Date   TSH 0.824 03/23/2023   TSH 1.500 09/28/2022    Therapeutic Level Labs: No results found for: "LITHIUM" No results found for: "VALPROATE" No results found for: "CBMZ"   Screenings: GAD-7    Flowsheet Row [facility]  Total GAD-7 Score 16      Mini-Mental    Flowsheet Row Clinical Support from 09/03/2018 in [facility] Office Visit from 11/03/2015 in [facility] Office Visit from 05/05/2015 in [facility] Office Visit from 11/05/2014 in [facility]  Total Score (max 30 points ) 29 27 26 28       PHQ2-9    Flowsheet Row Office Visit from 07/25/2023 in [facility] Office Visit from 03/23/2023 in [facility] Office Visit from 11/21/2022 in [facility] Clinical Support from 09/28/2022 in [facility] Office Visit from 07/04/2022 in [facility]  PHQ-2 Total Score 2 4 0 0 5  PHQ-9 Total Score 7 9 5  -- 19      Flowsheet Row ED from 08/09/2022 in   [facility] Office Visit from 07/04/2022 in [facility] Admission (Discharged) from 09/09/2021 in [facility]  C-SSRS RISK CATEGORY No Risk No Risk No Risk       Collaboration of Care: Collaboration of Care: Medication Management AEB medication prescription and Other provider involved in patient's care AEB cardiology chart review  Patient/Guardian was advised Release of Information must be obtained prior to any record release in order to collaborate their care with an outside provider. Patient/Guardian was advised if they have not already done so to contact the registration department to sign all necessary forms in order for [place] to release information regarding their care.   Consent: Patient/Guardian gives verbal consent for treatment and assignment of benefits for services provided during this visit. Patient/Guardian expressed understanding and agreed to proceed.    Stasia Cavalier, MD 08/22/2023, 12:49 PM

## 2023-08-22 ENCOUNTER — Ambulatory Visit (HOSPITAL_BASED_OUTPATIENT_CLINIC_OR_DEPARTMENT_OTHER): Payer: Medicare Other | Admitting: Psychiatry

## 2023-08-22 ENCOUNTER — Encounter (HOSPITAL_COMMUNITY): Payer: Self-pay | Admitting: Psychiatry

## 2023-08-22 VITALS — BP 111/73 | HR 69 | Ht 62.0 in | Wt 153.0 lb

## 2023-08-22 DIAGNOSIS — F411 Generalized anxiety disorder: Secondary | ICD-10-CM | POA: Diagnosis not present

## 2023-08-22 DIAGNOSIS — F331 Major depressive disorder, recurrent, moderate: Secondary | ICD-10-CM | POA: Diagnosis not present

## 2023-08-22 MED ORDER — CLONAZEPAM 0.5 MG PO TABS: 0.2500 mg | ORAL_TABLET | Freq: Every day | ORAL | 1 refills | Status: DC | PRN

## 2023-09-04 DIAGNOSIS — H43812 Vitreous degeneration, left eye: Secondary | ICD-10-CM | POA: Diagnosis not present

## 2023-09-04 DIAGNOSIS — H43821 Vitreomacular adhesion, right eye: Secondary | ICD-10-CM | POA: Diagnosis not present

## 2023-09-04 DIAGNOSIS — H353123 Nonexudative age-related macular degeneration, left eye, advanced atrophic without subfoveal involvement: Secondary | ICD-10-CM | POA: Diagnosis not present

## 2023-09-04 DIAGNOSIS — H353211 Exudative age-related macular degeneration, right eye, with active choroidal neovascularization: Secondary | ICD-10-CM | POA: Diagnosis not present

## 2023-09-04 DIAGNOSIS — H35033 Hypertensive retinopathy, bilateral: Secondary | ICD-10-CM | POA: Diagnosis not present

## 2023-09-10 ENCOUNTER — Encounter (HOSPITAL_COMMUNITY): Payer: Self-pay

## 2023-10-04 NOTE — Progress Notes (Unsigned)
[facility]  PATIENT: [name] DOB: 09-Apr-1956   REASON FOR VISIT: Follow-up for nontraumatic intracerebral hemorrhage, abnormality of gait, right foot drop, complex partial seizure disorder, and vascular headaches   Chief complaint: No chief complaint on file.  Follow-up visit:  Prior visit: 10/04/2022  Brief HPI:  [name] is a 68 y.o. 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  At prior visit, overall stable, continued on Keppra for seizure prophylaxis.  Started on Nurtec as needed for headache rescue.   Interval history:  Returns for follow-up visit.  She has been stable without new stroke/TIA symptoms.  Residual stroke deficits stable.  Use of cane, ***.  Remains on Plavix, aspirin and Crestor.  Routinely follows with PCP for stroke risk factor management.  Remains on Keppra 500 mg twice daily, no seizure activity.  Headaches *** Use of Nurtec***   Returns today 10/04/2022 after prior visit 10/04/2021 unaccompanied.  Has been stable since prior visit without any new stroke/TIA symptoms.  Residual right spastic hemiparesis with numbness (leg > arm) and gait impairment stable. Right sided numbness can fluctuate. Has not been doing exercises routinely at home but plans on getting back in to doing these. Continue use of cane, denies any recent falls.  Compliant on Plavix, aspirin and Crestor.  Blood pressure today 130/69. Routinely follows with PCP.   Denies any seizure activity. Remains on keppra 500mg  twice daily.   Does still have occassional fluttering of her eyes which is not new, after further discussion, sounds to be more consistent with nystagmus, can worsen with quick head  movements and usually associated with vertigo. She has not experienced the symptoms recently. She is aware of when these are happening. No other associated symptoms.   Will have occasional headaches about 4-5x per month. Typically will resolve on their own, at times will take Excedrin for more severe headaches which will help.        REVIEW OF SYSTEMS: Full 14 system review of systems performed and notable only for those listed, all others are neg:  Constitutional: neg Cardiovascular: neg Ear/Nose/Throat: neg  Skin: neg Eyes: neg Respiratory: neg Gastroitestinal: neg  Hematology/Lymphatic: neg Endocrine: neg Musculoskeletal: Walking difficulty, neck pain Allergy/Immunology: neg Neurological: Weakness, spasticity, headache, seizures Psychiatric: neg Sleep : neg   ALLERGIES: Allergies  Allergen Reactions   Phenobarbital Hives, Shortness Of Breath and Swelling   Phenytoin Anaphylaxis, Hives and Swelling    HOME MEDICATIONS: Outpatient Medications Prior to Visit  Medication Sig Dispense Refill   albuterol (PROVENTIL) (2.5 MG/3ML) 0.083% nebulizer solution Take 3 mLs (2.5 mg total) by nebulization every 6 (six) hours. 1080 mL 1   albuterol (VENTOLIN HFA) 108 (90 Base) MCG/ACT inhaler Inhale 2 puffs into the lungs every 6 (six) hours as needed for wheezing or shortness of breath. 24 g 3   aspirin EC 81 MG tablet Take 81 mg by mouth daily.     Biotin 1 MG CAPS Take 1 tablet by mouth 2 (two) times daily. Viviscal     Calcium-Magnesium-Zinc (CAL-MAG-ZINC PO) Take 1 tablet by mouth daily.     Cholecalciferol (VITAMIN D-3) 1000 units CAPS Take 2,000 Units by mouth at bedtime.     clonazePAM (KLONOPIN) 0.5 MG tablet Take 0.5 tablets (0.25 mg total) by mouth daily as needed for anxiety.  15 tablet 1   clopidogrel (PLAVIX) 75 MG tablet TAKE 1 TABLET DAILY 90 tablet 3   diclofenac Sodium (VOLTAREN) 1 % GEL Voltaren 1 % topical gel  APPLY 2 GRAM TO THE AFFECTED AREA(S) BY TOPICAL ROUTE 4  TIMES PER DAY 350 g 2   escitalopram (LEXAPRO) 10 MG tablet Take 1 tablet (10 mg total) by mouth at bedtime. 90 tablet 1   fluticasone (FLONASE) 50 MCG/ACT nasal spray Place 2 sprays into both nostrils daily as needed (allergies or post-nasl drip).  3   levETIRAcetam (KEPPRA) 500 MG tablet Take 1 tablet (500 mg total) by mouth every 12 (twelve) hours. 180 tablet 3   levothyroxine (SYNTHROID) 50 MCG tablet TAKE 1 TABLET ON SATURDAY AND "SUNDAY 26 tablet 3   levothyroxine (SYNTHROID) 75 MCG tablet TAKE 1 TABLET IN THE MORNING BEFORE BREAKFAST MONDAY THROUGH FRIDAY (TAKE 50 MCG ON OTHER DAYS) 51 tablet 4   loratadine (CLARITIN) 10 MG tablet Take 1 tablet (10 mg total) by mouth daily. 30 tablet 2   Multiple Vitamins-Minerals (PRESERVISION/LUTEIN PO) Take 1 tablet by mouth daily.     NONFORMULARY OR COMPOUNDED ITEM Apply 240 mg topically daily as needed. 1 each 3   Omega-3 Fatty Acids (FISH OIL) 1000 MG CAPS Take 2,000 mg by mouth 2 (two) times daily.      Polyethyl Glycol-Propyl Glycol (SYSTANE) 0.4-0.3 % SOLN Place 1 drop into both eyes daily as needed (dry eyes).     Psyllium (FIBER) 28.3 % POWD Take 10 mLs by mouth 2 (two) times daily.     rosuvastatin (CRESTOR) 20 MG tablet TAKE 1 TABLET DAILY (REPLACES 10 MG DOSE) 90 tablet 3   telmisartan (MICARDIS) 20 MG tablet Take 1 tablet (20 mg total) by mouth daily. 90 tablet 3   tiZANidine (ZANAFLEX) 4 MG tablet TAKE 1 TABLET EVERY EVENING AS NEEDED 60 tablet 5   traZODone (DESYREL) 50 MG tablet Take 1.5 tablets (75 mg total) by mouth at bedtime as needed for sleep. 135 tablet 1   vitamin C (ASCORBIC ACID) 500 MG tablet Take 1,000 mg by mouth at bedtime. Rose hips     No facility-administered medications prior to visit.    PAST MEDICAL HISTORY: Past Medical History:  Diagnosis Date   Abnormal gait    Amnesia    Anemia    hx   Aneurysm (HCC)    Anxiety    Asthma    Atrophic vaginitis    Benign hypertensive heart disease    Cardiomegaly     Chest pain    COPD (chronic obstructive pulmonary disease) (HCC)     - PFTs  01/24/06 FEV1 69% ratio 68% diffusing capacity 57% with no improvement after B2   - PFT's 10/1/ 09 FEV1 77   ratio  76    dlco                   45"             no resp to B2    - Nl alpha one antitripsin level 10/09   Cough    Depression    Difficulty speaking    Disorder of nasal cavity    Dyspnea    Enthesopathy of elbow region    GERD (gastroesophageal reflux disease)    Hammer toe    Headache(784.0)    Hemiparesis affecting dominant side as late effect of cerebrovascular accident [facility])    History of kidney stones    Hyperlipidemia  Hypertension    Hypothyroidism    Increased urinary frequency    Low compliance bladder    Macular degeneration of both eyes    Malaise and fatigue    Nonruptured cerebral aneurysm    Obesity    Pneumonia    hx   Pure hypercholesterolemia    Recurrent major depressive episodes (HCC)    Seizure disorder, secondary (HCC)    2-3 focal siezures monthly   Seizures (HCC)    Short-term memory loss    since stroke   Skin sensation disturbance    Stroke [employer]) 2011   rt sided weakness   Swelling of limb    TMJ (temporomandibular joint disorder)    Urinary tract infectious disease    Vertigo as late effect of stroke    Vitamin D deficiency     PAST SURGICAL HISTORY: Past Surgical History:  Procedure Laterality Date   ANTERIOR CERVICAL DECOMP/DISCECTOMY FUSION N/A 09/09/2021   Procedure: Anterior  Cervical Decompression Fusion  Cervical four-five, Cervical five-six;  Surgeon: Bethann Goo, DO;  Location: [facility] OR;  Service: Neurosurgery;  Laterality: N/A;   BACK SURGERY     CARPAL TUNNEL RELEASE Left 09/09/2021   Procedure: LEFT CARPAL TUNNEL RELEASE;  Surgeon: Bethann Goo, DO;  Location: [facility] OR;  Service: Neurosurgery;  Laterality: Left;   CEREBRAL ANGIOGRAM     x3   COLONOSCOPY WITH PROPOFOL N/A 08/18/2016   Procedure: COLONOSCOPY WITH PROPOFOL;  Surgeon: Jeani Hawking,  MD;  Location: [facility] ENDOSCOPY;  Service: Endoscopy;  Laterality: N/A;   LAPAROSCOPIC HYSTERECTOMY     MANDIBLE FRACTURE SURGERY     RADIOLOGY WITH ANESTHESIA N/A 08/21/2013   Procedure: RADIOLOGY WITH ANESTHESIA;  Surgeon: Oneal Grout, MD;  Location: [facility] OR;  Service: Radiology;  Laterality: N/A;   stones     no surgery    FAMILY HISTORY: Family History  Problem Relation Age of Onset   Heart disease Mother    Heart disease Father    Heart attack Father    Cancer Father        prostate to lymph node   Breast cancer Maternal Aunt    Breast cancer Cousin    Breast cancer Cousin    Lung cancer Other     SOCIAL HISTORY: Social History   Socioeconomic History   Marital status: Married    Spouse name: [name]   Number of children: 2   Years of education: 14   Highest education level: Associate degree: academic program  Occupational History   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    Drug use: No   Sexual activity: Not Currently  Other Topics Concern   Not on file  Social History Narrative   Patient lives at home with her husband [name]).   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    Patient is on disability.    Patient has an Associates degree.   Patient is right-handed.   Patient drinks 2 cups of green tea most days.               Social Drivers of Corporate investment banker Strain:  Low Risk  (07/25/2023)   Overall Financial Resource Strain (CARDIA)    Difficulty of Paying Living Expenses: Not hard at all  Food Insecurity: No Food Insecurity (07/25/2023)   Hunger Vital Sign    Worried About Running Out of Food in the Last Year: Never true    Ran Out of Food in the Last Year: Never true  Transportation Needs: No Transportation Needs (07/25/2023)   PRAPARE - Administrator, Civil Service (Medical): No    Lack of Transportation (Non-Medical): No  Physical Activity: Sufficiently Active (07/25/2023)   Exercise Vital Sign    Days of Exercise per Week: 4 days    Minutes of Exercise per Session: 60 min  Stress: Stress Concern Present (07/25/2023)   [employer] of Occupational Health - Occupational Stress Questionnaire    Feeling of Stress : Rather much  Social Connections: Socially Integrated (07/25/2023)   Social Connection and Isolation Panel [NHANES]    Frequency of Communication with Friends and Family: More than three times a week    Frequency of Social Gatherings with Friends and Family: Three times a week    Attends Religious Services: More than 4 times per year    Active Member of Clubs or Organizations: Yes    Attends Banker Meetings: More than 4 times per year    Marital Status: Married  Catering manager Violence: Not on file     PHYSICAL EXAM  There were no vitals filed for this visit.  There is no height or weight on file to calculate BMI.  Generalized: Well developed, pleasant middle-age Caucasian female, in no acute distress  Head: normocephalic and atraumatic,. Oropharynx benign  Neck: Supple, no carotid bruits  Cardiac: Regular rate rhythm, no murmur  Skin no rash few bruises Musculoskeletal: cervical brace in place  Neurological examination   Mentation: Alert oriented to time, place, history taking. Follows all commands speech and language fluent  Cranial nerve II-XII: Pupils were equal round reactive to light  extraocular movements were full, visual field were full on confrontational test. Right lower facial weakness (most notable when smiling). hearing intact.  Uvula tongue midline.Tongue protrusion into cheek strength was normal. Motor: Full strength left upper and lower extremity. RUE 4/5 with spasticity; RLE: 4/5 hip flexor, knee extension and flexion; 3/5 ankle dorsiflexion and plantarflexion with increased tone distally Sensory: Touch and pinprick are normal on the left, mildly decreased on the right, vibratory is decreased on the right lower extremity  Coordination: Impaired on the right normal on the left  Reflexes: Brisk reflexes on right otherwise 1+ Gait and Station: Rising up from seated position without assistance, wide based hemiplegic gait right foot drop using cane with mild imbalance and occasionally veering towards left.  Tandem walk and heel toe not attempted      DIAGNOSTIC DATA (LABS, IMAGING, TESTING) - I reviewed patient records, labs, notes, testing and imaging myself where available.  Lab Results  Component Value Date   WBC 6.7 03/23/2023   HGB 14.2 03/23/2023   HCT 44.4 03/23/2023   MCV 94 03/23/2023   PLT 208 03/23/2023  Component Value Date/Time   NA 145 (H) 07/25/2023 1235   K 4.1 07/25/2023 1235   CL 104 07/25/2023 1235   CO2 27 07/25/2023 1235   GLUCOSE 94 07/25/2023 1235   GLUCOSE 97 09/02/2021 0943   BUN 13 07/25/2023 1235   CREATININE 0.64 07/25/2023 1235   CALCIUM 9.5 07/25/2023 1235   PROT 6.9 03/23/2023 1221   ALBUMIN 4.5 03/23/2023 1221   AST 20 03/23/2023 1221   ALT 20 03/23/2023 1221   ALKPHOS 103 03/23/2023 1221   BILITOT 0.7 03/23/2023 1221   GFRNONAA >60 09/02/2021 0943   GFRAA 107 09/16/2020 1429   Lipid Panel     Component Value Date/Time   CHOL 149 03/23/2023 1221   TRIG 103 03/23/2023 1221   HDL 74 03/23/2023 1221   CHOLHDL 2.0 03/23/2023 1221   CHOLHDL 2.8 11/23/2009 0435   VLDL 15 11/23/2009 0435   LDLCALC 57 03/23/2023  1221   LABVLDL 18 03/23/2023 1221         ASSESSMENT AND PLAN 68 y.o. year old female  has a past medical history of left frontoparietal parenchymal hemorrhage March 2011 from PRES and malignant hypertension with residual right spastic hemiparesis and gait impairment. Chronic headaches with history of migraines.  Placement of pipeline stent across Left PICA aneurysm by Dr. Corliss Skains on 08/21/2013. History of seizure disorder on Keppra.     1.  Left frontoparietal hemorrhage 10/2009 -Residual deficits right spastic hemiparesis and gait impairment -stable. Encouraged restart of exercises at home. -Continue Plavix and Crestor for secondary stroke prevention managed/prescribed by PCP- advised no indication for aspirin from stroke standpoint - ongoing use per Dr. Corliss Skains s/p intracranial stent -Discussed secondary stroke prevention measures and importance of close PCP follow-up for aggressive stroke risk factor management including HTN with BP goal <130/90 and HLD with LDL below<70   2.  Seizure disorder -no recent seizures -Continue Keppra 500 mg twice daily for seizure prophylaxis -refill provided  -As she has not had any seizures since 2011, discussed gradually tapering off of Keppra but as she drives and is independent, she wishes to continue for now   3. "Eye fluttering" -question if these symptoms are related to vertigo such as BPPV as she feels like her eyes are moving abnormally and associated with vertigo, trigger is quick head movement. She has not had any recent episodes, advised to call if these should reoccur and could try vestibular rehab for possible improvement -Low suspicion seizure related   4.  Chronic headaches with migraine history -occurring approx 4x/month -recommend trial of Nurtec 75mg  daily PRN for rescue, samples provided, no indication for preventative therapy at this time. Advised to call if beneficial for official prescription.  Triptans contraindicated due  to ICH history     Follow-up in 1 year or call earlier if needed    CC:  Dorothyann Peng, MD     I spent 36 minutes of face-to-face and non-face-to-face time with patient.  This included previsit chart review, lab review, study review, order entry, electronic health record documentation, patient education and discussion regarding above diagnoses and treatment plan and answered all other questions to patient satisfaction   Ihor Austin, [facility]  [facility] [address] [place], [place] [address]  Phone [phone] Fax [phone] Note: This document was prepared with digital dictation and possible smart phrase technology. Any transcriptional errors that result from this process are unintentional.

## 2023-10-05 ENCOUNTER — Other Ambulatory Visit (HOSPITAL_COMMUNITY): Payer: Self-pay | Admitting: Interventional Radiology

## 2023-10-05 ENCOUNTER — Encounter (HOSPITAL_COMMUNITY): Payer: Self-pay | Admitting: Psychiatry

## 2023-10-05 ENCOUNTER — Encounter: Payer: Self-pay | Admitting: Adult Health

## 2023-10-05 ENCOUNTER — Ambulatory Visit (INDEPENDENT_AMBULATORY_CARE_PROVIDER_SITE_OTHER): Payer: Medicare Other | Admitting: Adult Health

## 2023-10-05 ENCOUNTER — Telehealth (HOSPITAL_COMMUNITY): Payer: Medicare Other | Admitting: Psychiatry

## 2023-10-05 ENCOUNTER — Telehealth (HOSPITAL_COMMUNITY): Payer: Self-pay

## 2023-10-05 ENCOUNTER — Telehealth: Payer: Self-pay | Admitting: Adult Health

## 2023-10-05 VITALS — BP 132/69 | HR 70 | Ht 62.0 in | Wt 154.4 lb

## 2023-10-05 DIAGNOSIS — F331 Major depressive disorder, recurrent, moderate: Secondary | ICD-10-CM | POA: Diagnosis not present

## 2023-10-05 DIAGNOSIS — R519 Headache, unspecified: Secondary | ICD-10-CM

## 2023-10-05 DIAGNOSIS — G8929 Other chronic pain: Secondary | ICD-10-CM | POA: Diagnosis not present

## 2023-10-05 DIAGNOSIS — Z8673 Personal history of transient ischemic attack (TIA), and cerebral infarction without residual deficits: Secondary | ICD-10-CM | POA: Diagnosis not present

## 2023-10-05 DIAGNOSIS — F411 Generalized anxiety disorder: Secondary | ICD-10-CM

## 2023-10-05 DIAGNOSIS — I69398 Other sequelae of cerebral infarction: Secondary | ICD-10-CM

## 2023-10-05 DIAGNOSIS — G40909 Epilepsy, unspecified, not intractable, without status epilepticus: Secondary | ICD-10-CM

## 2023-10-05 DIAGNOSIS — G8111 Spastic hemiplegia affecting right dominant side: Secondary | ICD-10-CM | POA: Diagnosis not present

## 2023-10-05 DIAGNOSIS — I671 Cerebral aneurysm, nonruptured: Secondary | ICD-10-CM

## 2023-10-05 DIAGNOSIS — R269 Unspecified abnormalities of gait and mobility: Secondary | ICD-10-CM | POA: Diagnosis not present

## 2023-10-05 MED ORDER — TRAZODONE HCL 50 MG PO TABS: 75.0000 mg | ORAL_TABLET | Freq: Every evening | ORAL | 0 refills | Status: DC | PRN

## 2023-10-05 MED ORDER — LEVETIRACETAM 500 MG PO TABS: 500.0000 mg | ORAL_TABLET | Freq: Two times a day (BID) | ORAL | 3 refills | Status: DC

## 2023-10-05 NOTE — Progress Notes (Signed)
BH MD/PA/NP OP Progress Note  10/05/2023 4:58 PM [name]  MRN:  [id number]  Visit Diagnosis:    ICD-10-CM   1. GAD (generalized anxiety disorder)  F41.1 traZODone (DESYREL) 50 MG tablet    2. Moderate episode of recurrent major depressive disorder (HCC)  F33.1 traZODone (DESYREL) 50 MG tablet      Assessment: [name] is a 68 y.o. 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    During initial evaluation patient reported symptoms of depression including anhedonia, feelings of hopelessness, increased fatigue, decreased appetite, difficulty sleeping, difficulty concentrating, and feelings of worthlessness.  She denied any SI/HI or thoughts of self-harm along with any past history of mania or psychosis.  Patient also endorsed symptoms of anxiety including feeling nervous or on edge, being unable to control her worrying, worrying too much with different things, difficulty relaxing, and irritability.  Of note patient had a left frontoparietal parenchymal hemorrhage in March 2011 which correlated with the onset of her cognitive impairment, onset of seizures, and onset of depressive symptoms.  Patient was started on Keppra around that time and we discussed the possibility that it could be contributing to her increased irritability/agitation.    [name] presents for follow-up evaluation. Today, 10/05/23, patient reports that mood has been fairly stable other then increase irritability and agitation. This appears to be related to some interpersonal stressors particularly with her husband. It is unlikely this agitation is related to her medications. We discussed having a family meeting at her next session which patient.  Patient has continued her medications without any adverse side effects.  She has  only used Klonopin twice in the interim.  She has had a couple of falls though these occurred outside of the times her Klonopin use.  We will continue on her current regimen and follow-up in a month.  Plan: - Continue Lexapro (escitalopram) 10 mg at bedtime  - Continue trazodone to 50-100 mg at bedtime as needed - Continue Klonopin 0.25 QD prn for anxiety - EKG reviewed QTC 462 - Discuss Keppra with neurologist - Discontinued grief counseling - CBC, CMP, lipid profile, Vit D, and TSH reviewed - Follow up in 6 weeks   Chief Complaint:  Chief Complaint  Patient presents with   Follow-up   HPI: [name] reports that her mood is not that bad most of the time. But she has found that she can get more agitated then usual when something is said that is rude or dismissive to her. It is often in the context of her partner making her feel dumb. [name] describes her agitation as yelling and cursing. The episodes can last 10-15 and then she would go back and apologize. She has some frustration with his lack of understanding for her strokes and how it has effected her cognition/processing speed.   [name] feels like her mood lability increased after the stroke and then again after her sons death.  We did review how stroke can impact these symptoms.  We also reviewed how ongoing grief can increase emotional lability.   We will plan to have a family session at her next appointment to help patient convey her feelings to husband and the expectations after a stroke.  Outside of this patient reports things have gone alright. Christmas ended up going better then she expected. She managed to get through, and  while she missed her son it was not as bad as she had been expecting.   Medication wise she continues to take the Lexapro consistently alongside the trazodone.  She only used the Klonopin a couple of times when she was feeling particularly overwhelmed.  While patient has had a couple of falls in the interim both  of these were outside of times that she used the Klonopin.  That said likely the falls were secondary to balance issues caused by her prior stroke.  Past Psychiatric History: Patient reports that her psychiatric symptoms/depression started after 2011 when she had her stroke prior to that she reports minimal to no depressive symptoms.  She has been on Celexa for as long as she can remember post stroke and believes she was started on Xanax at the same time.  Patient denies any prior psychiatric hospitalizations or past suicide attempts.  She denies any current substance use since 2005 and reports drinking alcohol when she was younger.  Past Medical History:  Past Medical History:  Diagnosis Date   Abnormal gait    Amnesia    Anemia    hx   Aneurysm (HCC)    Anxiety    Asthma    Atrophic vaginitis    Benign hypertensive heart disease    Cardiomegaly    Chest pain    COPD (chronic obstructive pulmonary disease) (HCC)     - PFTs  01/24/06 FEV1 69% ratio 68% diffusing capacity 57% with no improvement after B2   - PFT's 10/1/ 09 FEV1 77   ratio  76    dlco                   45            no resp to B2    - Nl alpha one antitripsin level 10/09   Cough    Depression    Difficulty speaking    Disorder of nasal cavity    Dyspnea    Enthesopathy of elbow region    GERD (gastroesophageal reflux disease)    Hammer toe    Headache(784.0)    Hemiparesis affecting dominant side as late effect of cerebrovascular accident (HCC)    History of kidney stones    Hyperlipidemia    Hypertension    Hypothyroidism    Increased urinary frequency    Low compliance bladder    Macular degeneration of both eyes    Malaise and fatigue    Nonruptured cerebral aneurysm    Obesity    Pneumonia    hx   Pure hypercholesterolemia    Recurrent major depressive episodes (HCC)    Seizure disorder, secondary (HCC)    2-3 focal siezures monthly   Seizures (HCC)    Short-term memory loss    since stroke   Skin  sensation disturbance    Stroke [facility]) 2011   rt sided weakness   Swelling of limb    TMJ (temporomandibular joint disorder)    Urinary tract infectious disease    Vertigo as late effect of stroke    Vitamin D deficiency     Past Surgical History:  Procedure Laterality Date   ANTERIOR CERVICAL DECOMP/DISCECTOMY FUSION N/A 09/09/2021   Procedure: Anterior  Cervical Decompression Fusion  Cervical four-five, Cervical five-six;  Surgeon: Bethann Goo, DO;  Location: [facility] OR;  Service: Neurosurgery;  Laterality: N/A;   BACK SURGERY     CARPAL TUNNEL RELEASE Left 09/09/2021   Procedure: LEFT CARPAL TUNNEL  RELEASE;  Surgeon: Bethann Goo, DO;  Location: [facility] OR;  Service: Neurosurgery;  Laterality: Left;   CEREBRAL ANGIOGRAM     x3   COLONOSCOPY WITH PROPOFOL N/A 08/18/2016   Procedure: COLONOSCOPY WITH PROPOFOL;  Surgeon: Jeani Hawking, MD;  Location: [facility];  Service: Endoscopy;  Laterality: N/A;   LAPAROSCOPIC HYSTERECTOMY     MANDIBLE FRACTURE SURGERY     RADIOLOGY WITH ANESTHESIA N/A 08/21/2013   Procedure: RADIOLOGY WITH ANESTHESIA;  Surgeon: Oneal Grout, MD;  Location: [facility] OR;  Service: Radiology;  Laterality: N/A;   stones     no surgery   Family History:  Family History  Problem Relation Age of Onset   Heart disease Mother    Heart disease Father    Heart attack Father    Cancer Father        prostate to lymph node   Breast cancer Maternal Aunt    Breast cancer Cousin    Breast cancer Cousin    Lung cancer Other     Social History:  Social History   Socioeconomic History   Marital status: Married    Spouse name: Windy Fast   Number of children: 2   Years of education: 14   Highest education level: Associate degree: academic program  Occupational History   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    Drug use: No   Sexual activity: Not Currently  Other Topics Concern   Not on file  Social History Narrative   Patient lives at home with her husband Windy Fast).   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    Patient is on disability.    Patient has an Associates degree.   Patient is right-handed.   Patient drinks 2 cups of green tea most days.               Social Drivers of Corporate investment banker Strain: Low Risk  (07/25/2023)   Overall Financial Resource Strain (CARDIA)    Difficulty of Paying Living Expenses: Not hard at all  Food Insecurity: No Food Insecurity (07/25/2023)   Hunger Vital Sign    Worried About Running Out of Food in the Last Year: Never true    Ran Out of Food in the Last Year: Never true  Transportation Needs: No Transportation Needs (07/25/2023)   PRAPARE - Administrator, Civil Service (Medical): No    Lack of Transportation (Non-Medical): No  Physical Activity: Sufficiently Active (07/25/2023)   Exercise Vital Sign    Days of Exercise per Week: 4 days    Minutes of Exercise per Session: 60 min  Stress: Stress Concern Present (07/25/2023)   [employer] of Occupational Health - Occupational Stress Questionnaire    Feeling of Stress : Rather  much  Social Connections: Socially Integrated (07/25/2023)   Social Connection and Isolation Panel [NHANES]    Frequency of Communication with Friends and Family: More than three times a week    Frequency of Social Gatherings with Friends and Family: Three times a week    Attends  Religious Services: More than 4 times per year    Active Member of Clubs or Organizations: Yes    Attends Banker Meetings: More than 4 times per year    Marital Status: Married    Allergies:  Allergies  Allergen Reactions   Phenobarbital Hives, Shortness Of Breath and Swelling   Phenytoin Anaphylaxis, Hives and Swelling    Current Medications: Current Outpatient Medications  Medication Sig Dispense Refill   albuterol (PROVENTIL) (2.5 MG/3ML) 0.083% nebulizer solution Take 3 mLs (2.5 mg total) by nebulization every 6 (six) hours. 1080 mL 1   albuterol (VENTOLIN HFA) 108 (90 Base) MCG/ACT inhaler Inhale 2 puffs into the lungs every 6 (six) hours as needed for wheezing or shortness of breath. 24 g 3   aspirin EC 81 MG tablet Take 81 mg by mouth daily.     Biotin 1 MG CAPS Take 1 tablet by mouth 2 (two) times daily. Viviscal     Calcium-Magnesium-Zinc (CAL-MAG-ZINC PO) Take 1 tablet by mouth daily.     Cholecalciferol (VITAMIN D-3) 1000 units CAPS Take 2,000 Units by mouth at bedtime.     clonazePAM (KLONOPIN) 0.5 MG tablet Take 0.5 tablets (0.25 mg total) by mouth daily as needed for anxiety. 15 tablet 1   clopidogrel (PLAVIX) 75 MG tablet TAKE 1 TABLET DAILY 90 tablet 3   diclofenac Sodium (VOLTAREN) 1 % GEL Voltaren 1 % topical gel  APPLY 2 GRAM TO THE AFFECTED AREA(S) BY TOPICAL ROUTE 4 TIMES PER DAY 350 g 2   escitalopram (LEXAPRO) 10 MG tablet Take 1 tablet (10 mg total) by mouth at bedtime. 90 tablet 1   fluticasone (FLONASE) 50 MCG/ACT nasal spray Place 2 sprays into both nostrils daily as needed (allergies or post-nasl drip).  3   levETIRAcetam (KEPPRA) 500 MG tablet Take 1 tablet (500 mg total) by mouth every 12 (twelve) hours. 180 tablet 3   levothyroxine (SYNTHROID) 50 MCG tablet TAKE 1 TABLET ON SATURDAY AND SUNDAY 26 tablet 3   levothyroxine (SYNTHROID) 75 MCG tablet TAKE 1 TABLET IN THE MORNING BEFORE BREAKFAST MONDAY THROUGH FRIDAY (TAKE 50 MCG ON OTHER DAYS) 51  tablet 4   loratadine (CLARITIN) 10 MG tablet Take 1 tablet (10 mg total) by mouth daily. 30 tablet 2   Multiple Vitamins-Minerals (PRESERVISION/LUTEIN PO) Take 1 tablet by mouth daily.     NONFORMULARY OR COMPOUNDED ITEM Apply 240 mg topically daily as needed. 1 each 3   Omega-3 Fatty Acids (FISH OIL) 1000 MG CAPS Take 2,000 mg by mouth 2 (two) times daily.      Polyethyl Glycol-Propyl Glycol (SYSTANE) 0.4-0.3 % SOLN Place 1 drop into both eyes daily as needed (dry eyes).     Psyllium (FIBER) 28.3 % POWD Take 10 mLs by mouth 2 (two) times daily.     rosuvastatin (CRESTOR) 20 MG tablet TAKE 1 TABLET DAILY (REPLACES 10 MG DOSE) 90 tablet 3   telmisartan (MICARDIS) 20 MG tablet Take 1 tablet (20 mg total) by mouth daily. 90 tablet 3   tiZANidine (ZANAFLEX) 4 MG tablet TAKE 1 TABLET EVERY EVENING AS NEEDED 60 tablet 5   traZODone (DESYREL) 50 MG tablet Take 1.5 tablets (75 mg  total) by mouth at bedtime as needed for sleep. 135 tablet 0   vitamin C (ASCORBIC ACID) 500 MG tablet Take 1,000 mg by mouth at bedtime. Rose hips     No current facility-administered medications for this visit.     Psychiatric Specialty Exam: Review of Systems  There were no vitals taken for this visit.There is no height or weight on file to calculate BMI.  General Appearance: Fairly Groomed  Eye Contact:  Good  Speech:  Clear and Coherent and Normal Rate  Volume:  Normal  Mood:  Euthymic  Affect:  Appropriate  Thought Process:  Coherent  Orientation:  Full (Time, Place, and Person)  Thought Content: Logical   Suicidal Thoughts:  No  Homicidal Thoughts:  No  Memory:  Immediate;   Good  Judgement:  Fair  Insight:  Fair  Psychomotor Activity:  Normal  Concentration:  Concentration: Fair  Recall:  Fair  Fund of Knowledge: Fair  Language: Good  Akathisia:  NA    AIMS (if indicated): not done  Assets:  Communication Skills Desire for Improvement Housing  ADL's:  Intact  Cognition: WNL  Sleep:  Good    Metabolic Disorder Labs: Lab Results  Component Value Date   HGBA1C 5.8 (H) 07/25/2023   No results found for: "PROLACTIN" Lab Results  Component Value Date   CHOL 149 03/23/2023   TRIG 103 03/23/2023   HDL 74 03/23/2023   CHOLHDL 2.0 03/23/2023   VLDL 15 11/23/2009   LDLCALC 57 03/23/2023   LDLCALC 35 05/18/2022   Lab Results  Component Value Date   TSH 0.824 03/23/2023   TSH 1.500 09/28/2022    Therapeutic Level Labs: No results found for: "LITHIUM" No results found for: "VALPROATE" No results found for: "CBMZ"   Screenings: GAD-7    Flowsheet Row [facility]  Total GAD-7 Score 16      Mini-Mental    Flowsheet Row Clinical Support from 09/03/2018 in [facility] Office Visit from 11/03/2015 in [facility] Office Visit from 05/05/2015 [facility] Office Visit from 11/05/2014 in [facility]  Total Score (max 30 points ) 29 27 26 28       PHQ2-9    Flowsheet Row Office Visit from 07/25/2023 in [facility] Office Visit from 03/23/2023 in [facility] Office Visit from 11/21/2022 in [facility] Clinical Support from 09/28/2022 in [facility] Office Visit from 07/04/2022 in [facility]  PHQ-2 Total Score 2 4 0 0 5  PHQ-9 Total Score 7 9 5  -- 19      Flowsheet Row ED from 08/09/2022 in [facility] Office Visit from 07/04/2022 in [facility] Admission (Discharged) from 09/09/2021 in [facility]  C-SSRS RISK CATEGORY No Risk No Risk No Risk       Collaboration of Care: Collaboration of  Care: Medication Management AEB medication prescription and Other provider involved in patient's care AEB neurology chart review  Patient/Guardian was advised Release of Information must be obtained prior to any record release in order to collaborate their care with an outside provider. Patient/Guardian was advised if they have not already done so to contact the registration department to sign all necessary forms in  order for [place] to release information regarding their care.   Consent: Patient/Guardian gives verbal consent for treatment and assignment of benefits for services provided during this visit. Patient/Guardian expressed understanding and agreed to proceed.    Stasia Cavalier, MD 10/05/2023, 4:58 PM   Virtual Visit via Video Note  I connected with [name] on 10/05/23 at  4:00 PM EST by a video enabled telemedicine application and verified that I am speaking with the correct person using two identifiers.  Location: Patient: Home Provider: Home Office   I discussed the limitations of evaluation and management by telemedicine and the availability of in person appointments. The patient expressed understanding and agreed to proceed.   I discussed the assessment and treatment plan with the patient. The patient was provided an opportunity to ask questions and all were answered. The patient agreed with the plan and demonstrated an understanding of the instructions.   The patient was advised to call back or seek an in-person evaluation if the symptoms worsen or if the condition fails to improve as anticipated.  I provided 25 minutes of non-face-to-face time during this encounter.   Stasia Cavalier, MD

## 2023-10-05 NOTE — Telephone Encounter (Signed)
Pt called to verify appointment

## 2023-10-05 NOTE — Patient Instructions (Addendum)
Your Plan:  Continue Keppra 500 mg twice daily for seizure prevention  Please call with any seizure activity  Will look into getting set up with a scooter - will keep you updated about this but if you do not hear anything over the next 1-2 weeks, please let me know  Please let me know if you are interested in doing any additional therapy   Follow up with Dr. Corliss Skains - they should reach out to you to schedule you with a follow up visit  Please let me know if you are interested in starting something for headaches  Continue to follow with your PCP for stroke risk factor management     Follow-up in 1 year or call earlier if needed     Thank you for coming to see [place] at [facility]. I hope we have been able to provide you high quality care today.  You may receive a patient satisfaction survey over the next few weeks. We would appreciate your feedback and comments so that we may continue to improve ourselves and the health of our patients.

## 2023-10-05 NOTE — Telephone Encounter (Signed)
Called to schedule mra, no answer, left vm. AB

## 2023-10-06 ENCOUNTER — Ambulatory Visit (HOSPITAL_COMMUNITY)
Admission: RE | Admit: 2023-10-06 | Discharge: 2023-10-06 | Disposition: A | Discharge status: Home / Self Care | Payer: Medicare Other | Source: Ambulatory Visit | Attending: Interventional Radiology | Admitting: Interventional Radiology

## 2023-10-06 ENCOUNTER — Other Ambulatory Visit: Payer: Self-pay | Admitting: Internal Medicine

## 2023-10-06 ENCOUNTER — Encounter: Payer: Self-pay | Admitting: Adult Health

## 2023-10-06 DIAGNOSIS — R519 Headache, unspecified: Secondary | ICD-10-CM | POA: Insufficient documentation

## 2023-10-06 DIAGNOSIS — I671 Cerebral aneurysm, nonruptured: Secondary | ICD-10-CM | POA: Diagnosis not present

## 2023-10-06 DIAGNOSIS — Z1231 Encounter for screening mammogram for malignant neoplasm of breast: Secondary | ICD-10-CM

## 2023-10-11 ENCOUNTER — Ambulatory Visit: Payer: Medicare Other | Admitting: Pulmonary Disease

## 2023-10-11 ENCOUNTER — Encounter: Payer: Self-pay | Admitting: Pulmonary Disease

## 2023-10-11 DIAGNOSIS — J449 Chronic obstructive pulmonary disease, unspecified: Secondary | ICD-10-CM

## 2023-10-11 MED ORDER — ANORO ELLIPTA 62.5-25 MCG/ACT IN AEPB: 1.0000 | INHALATION_SPRAY | Freq: Every day | RESPIRATORY_TRACT | 3 refills | Status: DC

## 2023-10-11 MED ORDER — ALBUTEROL SULFATE HFA 108 (90 BASE) MCG/ACT IN AERS: 2.0000 | INHALATION_SPRAY | Freq: Four times a day (QID) | RESPIRATORY_TRACT | 3 refills | Status: AC | PRN

## 2023-10-11 NOTE — Progress Notes (Signed)
Patient seen in the office today and instructed on use of Anoro.  Patient expressed understanding and demonstrated technique. 

## 2023-10-11 NOTE — Patient Instructions (Signed)
 I will see you back in about 6 to 8 weeks  Prescription for an inhaler will be sent to pharmacy for you  Anoro-this does not have a steroid in it -This is to be used on a daily basis  We will also send a prescription for your rescue inhaler to be used as needed, may be needed up to 4 times a day but if you are feeling like your usual and not unusually short of breath, you do not need to use it  Call us  with significant concerns  Depending on how you are feeling at the next visit, we may repeat the breathing study

## 2023-10-11 NOTE — Progress Notes (Signed)
 Subjective:    Patient ID: [name], female    DOB: Jul 13, 1956, 68 y.o.   MRN: [id number]  Follow-up for chronic obstructive pulmonary disease  Shortness of breath with activity Sometimes when she wakes up to go use the bathroom she will feel more short of breath  Tries to exercise regularly going to the gym She does have musculoskeletal limitations related to her previous stroke  Previous PFT with obstructive disease with significant bronchodilator response  She does have a longstanding history of COPD, previous stroke, she was using inhalers, nebulizers previously  Currently able to ambulate, does get short of breath with exertion She does use a cane for ambulation  Has been trying to get more active and started going to the gym  She does have daytime fatigue and sleepiness Multiple medications may be contributing to this  Comorbidities include hypertension, hypercholesterolemia, chronic headaches, CVA     Past Medical History:  Diagnosis Date   Abnormal gait    Amnesia    Anemia    hx   Aneurysm (HCC)    Anxiety    Asthma    Atrophic vaginitis    Benign hypertensive heart disease    Cardiomegaly    Chest pain    COPD (chronic obstructive pulmonary disease) (HCC)     - PFTs  01/24/06 FEV1 69% ratio 68% diffusing capacity 57% with no improvement after B2   - PFT's 10/1/ 09 FEV1 77   ratio  76    dlco                   45            no resp to B2    - Nl alpha one antitripsin level 10/09   Cough    Depression    Difficulty speaking    Disorder of nasal cavity    Dyspnea    Enthesopathy of elbow region    GERD (gastroesophageal reflux disease)    Hammer toe    Headache(784.0)    Hemiparesis affecting dominant side as late effect of cerebrovascular accident (HCC)    History of kidney stones    Hyperlipidemia    Hypertension    Hypothyroidism    Increased urinary frequency    Low compliance bladder    Macular degeneration of both eyes    Malaise  and fatigue    Nonruptured cerebral aneurysm    Obesity    Pneumonia    hx   Pure hypercholesterolemia    Recurrent major depressive episodes (HCC)    Seizure disorder, secondary (HCC)    2-3 focal siezures monthly   Seizures (HCC)    Short-term memory loss    since stroke   Skin sensation disturbance    Stroke (HCC) 2011   rt sided weakness   Swelling of limb    TMJ (temporomandibular joint disorder)    Urinary tract infectious disease    Vertigo as late effect of stroke    Vitamin D  deficiency    Social History   Socioeconomic History   Marital status: Married    Spouse name: Tanda   Number of children: 2   Years of education: 14   Highest education level: Associate degree: academic program  Occupational History   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    Drug use: No   Sexual activity: Not Currently  Other Topics Concern   Not on file  Social History Narrative   Patient lives at home with her husband ETTER Ingle).   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    Patient is on disability.    Patient has an Associates degree.   Patient is right-handed.   Patient drinks 2 cups of green tea most days.               Social Drivers of Corporate Investment Banker Strain: Low Risk  (07/25/2023)   Overall Financial Resource Strain (CARDIA)    Difficulty of Paying Living Expenses: Not hard at all   Food Insecurity: No Food Insecurity (07/25/2023)   Hunger Vital Sign    Worried About Running Out of Food in the Last Year: Never true    Ran Out of Food in the Last Year: Never true  Transportation Needs: No Transportation Needs (07/25/2023)   PRAPARE - Administrator, Civil Service (Medical): No    Lack of Transportation (Non-Medical): No  Physical Activity: Sufficiently Active (07/25/2023)   Exercise Vital Sign    Days of Exercise per Week: 4 days    Minutes of Exercise per Session: 60 min  Stress: Stress Concern Present (07/25/2023)   [employer] of Occupational Health - Occupational Stress Questionnaire    Feeling of Stress : Rather much  Social Connections: Socially Integrated (07/25/2023)   Social Connection and Isolation Panel [NHANES]    Frequency of Communication with Friends and Family: More than three times a week    Frequency of Social Gatherings with Friends and Family: Three times a week    Attends Religious Services: More than 4 times per year    Active Member of Clubs or Organizations: Yes    Attends Engineer, Structural: More than 4 times per year    Marital Status: Married  Catering Manager Violence: Not on file   Family History  Problem Relation Age of Onset   Heart disease Mother    Heart disease Father    Heart attack Father    Cancer Father        prostate to lymph node   Breast cancer Maternal Aunt    Breast cancer Cousin    Breast cancer Cousin    Lung cancer Other    Review of Systems  Constitutional:  Negative for fever and unexpected weight change.  HENT:  Negative for congestion, dental problem, ear pain, nosebleeds, postnasal drip, rhinorrhea, sinus pressure, sneezing, sore throat and trouble swallowing.   Eyes:  Negative for redness and itching.  Respiratory:  Positive for cough and shortness of breath. Negative for chest tightness and wheezing.   Cardiovascular:  Negative for palpitations.  Gastrointestinal:   Negative for nausea and vomiting.  Genitourinary:  Negative for dysuria.  Musculoskeletal:  Positive for joint swelling.  Skin:  Negative for rash.  Allergic/Immunologic: Negative.  Negative for environmental allergies, food allergies and immunocompromised state.  Psychiatric/Behavioral:  Negative for dysphoric mood.        Objective:   Physical Exam Constitutional:      Appearance: Normal appearance.  HENT:     Head: Normocephalic and atraumatic.     Mouth/Throat:     Mouth: Mucous membranes are moist.  Cardiovascular:     Rate and Rhythm: Normal rate and regular rhythm.     Pulses: Normal pulses.     Heart sounds: Normal heart sounds. No murmur heard.    No friction rub.  Pulmonary:     Effort: Pulmonary effort is normal. No respiratory distress.     Breath sounds: Normal breath sounds. No stridor. No wheezing or rhonchi.  Musculoskeletal:     Cervical back: Normal range of motion. No rigidity or tenderness.  Neurological:     Mental Status: She is alert.  Psychiatric:        Mood and Affect: Mood normal.    Vitals:   10/11/23 1057  BP: 110/70  Pulse: 84  SpO2: 95%    Recent PFT reviewed showing small airway disease with significant bronchodilator response in the small airways Nocturnal oximetry with nocturnal desaturations  Low-dose CT scan of the chest performed January 2022-no lung nodules, some evidence of bronchiectasis    Assessment & Plan:  .  History of COPD  .  Shortness of breath with exertion is likely multifactorial  .  Compliance with inhalers was discussed -Inhaler with steroids previously did cause thrush regardless of whether she rinsed her mouth or brushed teeth following use   .  Will call in a prescription for Anoro to be used daily  .  Rescue inhaler use as needed  .  Continue graded activities as tolerated  .  Consider pulmonary function test following evaluation in about 6 to 8 weeks  .  Encouraged to call with significant  concerns

## 2023-10-17 ENCOUNTER — Telehealth: Payer: Self-pay | Admitting: Neurology

## 2023-10-17 ENCOUNTER — Encounter: Payer: Self-pay | Admitting: Adult Health

## 2023-10-17 DIAGNOSIS — G8111 Spastic hemiplegia affecting right dominant side: Secondary | ICD-10-CM

## 2023-10-17 DIAGNOSIS — Z8673 Personal history of transient ischemic attack (TIA), and cerebral infarction without residual deficits: Secondary | ICD-10-CM

## 2023-10-17 DIAGNOSIS — I69398 Other sequelae of cerebral infarction: Secondary | ICD-10-CM

## 2023-10-17 NOTE — Telephone Encounter (Signed)
Called the patient to let her know the order for the motor scooter was sent originally to [facility]. They have contacted back stating they are only working with [facility] when it comes to scooters and wheelchairs.  Asked the pt if she is aware of who her insurance prefers. She states she is not sure. I offered [employer]'s phone number to them for to contact them and see if they accept her insurance and whether they can assist in getting her set up. Pt will reach back and let us know where to send the order.

## 2023-10-20 ENCOUNTER — Telehealth: Payer: Self-pay

## 2023-10-20 ENCOUNTER — Ambulatory Visit: Payer: Medicare Other | Attending: Adult Health

## 2023-10-20 VITALS — BP 133/63 | HR 70

## 2023-10-20 DIAGNOSIS — R269 Unspecified abnormalities of gait and mobility: Secondary | ICD-10-CM | POA: Diagnosis not present

## 2023-10-20 DIAGNOSIS — G8111 Spastic hemiplegia affecting right dominant side: Secondary | ICD-10-CM | POA: Diagnosis not present

## 2023-10-20 DIAGNOSIS — R262 Difficulty in walking, not elsewhere classified: Secondary | ICD-10-CM | POA: Diagnosis not present

## 2023-10-20 DIAGNOSIS — R2689 Other abnormalities of gait and mobility: Secondary | ICD-10-CM | POA: Diagnosis not present

## 2023-10-20 DIAGNOSIS — R293 Abnormal posture: Secondary | ICD-10-CM | POA: Diagnosis not present

## 2023-10-20 DIAGNOSIS — I69398 Other sequelae of cerebral infarction: Secondary | ICD-10-CM | POA: Diagnosis not present

## 2023-10-20 DIAGNOSIS — R278 Other lack of coordination: Secondary | ICD-10-CM

## 2023-10-20 DIAGNOSIS — Z8673 Personal history of transient ischemic attack (TIA), and cerebral infarction without residual deficits: Secondary | ICD-10-CM | POA: Insufficient documentation

## 2023-10-20 DIAGNOSIS — M6281 Muscle weakness (generalized): Secondary | ICD-10-CM

## 2023-10-20 NOTE — Therapy (Signed)
OUTPATIENT PHYSICAL THERAPY NEURO EVALUATION   Patient Name: [name] MRN: [id number] DOB:03-28-1956, 68 y.o., female Today's Date: 10/20/2023   PCP: Dorothyann Peng, MD REFERRING PROVIDER: Ihor Austin, NP  END OF SESSION:  PT End of Session - 10/20/23 1308     Visit Number 1    Number of Visits 1    Authorization Type medicare/tricare    Progress Note Due on Visit 10    PT Start Time 1316    PT Stop Time 1400    PT Time Calculation (min) 44 min    Equipment Utilized During Treatment Gait belt    Activity Tolerance Patient tolerated treatment well    Behavior During Therapy WFL for tasks assessed/performed             Past Medical History:  Diagnosis Date   Abnormal gait    Amnesia    Anemia    hx   Aneurysm (HCC)    Anxiety    Asthma    Atrophic vaginitis    Benign hypertensive heart disease    Cardiomegaly    Chest pain    COPD (chronic obstructive pulmonary disease) (HCC)     - PFTs  01/24/06 FEV1 69% ratio 68% diffusing capacity 57% with no improvement after B2   - PFT's 10/1/ 09 FEV1 77   ratio  76    dlco                   45            no resp to B2    - Nl alpha one antitripsin level 10/09   Cough    Depression    Difficulty speaking    Disorder of nasal cavity    Dyspnea    Enthesopathy of elbow region    GERD (gastroesophageal reflux disease)    Hammer toe    Headache(784.0)    Hemiparesis affecting dominant side as late effect of cerebrovascular accident (HCC)    History of kidney stones    Hyperlipidemia    Hypertension    Hypothyroidism    Increased urinary frequency    Low compliance bladder    Macular degeneration of both eyes    Malaise and fatigue    Nonruptured cerebral aneurysm    Obesity    Pneumonia    hx   Pure hypercholesterolemia    Recurrent major depressive episodes (HCC)    Seizure disorder, secondary (HCC)    2-3 focal siezures monthly   Seizures (HCC)    Short-term memory loss    since stroke   Skin  sensation disturbance    Stroke [facility]) 2011   rt sided weakness   Swelling of limb    TMJ (temporomandibular joint disorder)    Urinary tract infectious disease    Vertigo as late effect of stroke    Vitamin D deficiency    Past Surgical History:  Procedure Laterality Date   ANTERIOR CERVICAL DECOMP/DISCECTOMY FUSION N/A 09/09/2021   Procedure: Anterior  Cervical Decompression Fusion  Cervical four-five, Cervical five-six;  Surgeon: Bethann Goo, DO;  Location: [facility] OR;  Service: Neurosurgery;  Laterality: N/A;   BACK SURGERY     CARPAL TUNNEL RELEASE Left 09/09/2021   Procedure: LEFT CARPAL TUNNEL RELEASE;  Surgeon: Bethann Goo, DO;  Location: [facility] OR;  Service: Neurosurgery;  Laterality: Left;   CEREBRAL ANGIOGRAM     x3   COLONOSCOPY WITH PROPOFOL N/A 08/18/2016   Procedure: COLONOSCOPY WITH  PROPOFOL;  Surgeon: Jeani Hawking, MD;  Location: [facility];  Service: Endoscopy;  Laterality: N/A;   LAPAROSCOPIC HYSTERECTOMY     MANDIBLE FRACTURE SURGERY     RADIOLOGY WITH ANESTHESIA N/A 08/21/2013   Procedure: RADIOLOGY WITH ANESTHESIA;  Surgeon: Oneal Grout, MD;  Location: [facility] OR;  Service: Radiology;  Laterality: N/A;   stones     no surgery   Patient Active Problem List   Diagnosis Date Noted   Frequent falls 08/03/2023   History of tobacco use 08/03/2023   Urge incontinence 03/27/2023   Paresthesia of right lower extremity 03/27/2023   Estrogen deficiency 03/27/2023   Other abnormal glucose 03/27/2023   MDD (major depressive disorder) 07/04/2022   GAD (generalized anxiety disorder) 07/04/2022   Cervical myelopathy (HCC) 09/09/2021   Family history of heart disease 06/14/2020   History of CVA (cerebrovascular accident) 06/14/2020   Exudative age-related macular degeneration of right eye with active choroidal neovascularization (HCC) 09/11/2019   Nocturnal hypoxemia 09/11/2019   Non-restorative sleep 06/13/2019   Benign myoclonus of drowsiness 06/13/2019   Excessive  daytime sleepiness 06/13/2019   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   Hemiparesis affecting right side as late effect of cerebrovascular accident (HCC) 06/13/2019   Macular degeneration of right eye 06/11/2019   Class 1 obesity due to excess calories with serious comorbidity and body mass index (BMI) of 31.0 to 31.9 in adult 06/11/2019   Moderate episode of recurrent major depressive disorder (HCC) 06/11/2019   Spastic hemiparesis (HCC) 03/25/2019   Prediabetes 01/01/2019   Acquired hypothyroidism 01/01/2019   Hypertensive heart disease 01/01/2019   Atherosclerosis of aorta (HCC) 11/07/2018   History of embolic stroke without residual deficits 03/29/2018   Seizure disorder (HCC) 01/12/2017   Common migraine 01/12/2017   Aneurysm (HCC)    Brain aneurysm 08/21/2013   Right foot drop 04/10/2013   Intracerebral hemorrhage (HCC) 03/19/2013   Convulsions (HCC) 03/19/2013   Headache 03/19/2013   Abnormality of gait 03/19/2013   Edema 02/23/2011   COUGH 06/10/2008   HYPERLIPIDEMIA 04/25/2008   TOBACCO ABUSE 04/25/2008   ASTHMA 04/25/2008   Chronic obstructive pulmonary disease (HCC) 04/25/2008    ONSET DATE: 10/17/23 referral  REFERRING DIAG:  Z61.09 (ICD-10-CM) - History of stroke  G81.11 (ICD-10-CM) - Right spastic hemiparesis (HCC)  I69.398,R26.9 (ICD-10-CM) - Gait disturbance, post-stroke    THERAPY DIAG:  Abnormal posture - Plan: PT plan of care cert/re-cert  Difficulty in walking, not elsewhere classified - Plan: PT plan of care cert/re-cert  Muscle weakness (generalized) - Plan: PT plan of care cert/re-cert  Other abnormalities of gait and mobility - Plan: PT plan of care cert/re-cert  Other lack of coordination - Plan: PT plan of care cert/re-cert  Rationale for Evaluation and Treatment: Rehabilitation  SUBJECTIVE:  SUBJECTIVE STATEMENT: Patient arrives to clinic with spouse, who remained in the lobby. Ambulating with a SPC. Has had multiple falls in the past year, some resulting in hitting her head. All imaging has been stable. Inquiring about a new AFO. Currently wear a lace up ASO. Per patient, they think she's having "mini seizures" because her eyes will flicker and she won't remember why she fell.  Pt accompanied by: significant other  PERTINENT HISTORY: aneurysm, anxiety, cardiomegaly, COPD, GERD, HTN, HLD, macular degeneration, seizure, vertigo  PAIN:  Are you having pain? No  PRECAUTIONS: Fall and Other: R-hemi  WEIGHT BEARING RESTRICTIONS: No  FALLS: Has patient fallen in last 6 months? Yes. Number of falls multiple, rolled ankle  LIVING ENVIRONMENT: Lives with: lives with their family Lives in: House/apartment Stairs: Yes: External: 7 steps; on right going up Has following equipment at home: Single point cane, Walker - 2 wheeled, Environmental consultant - 4 wheeled, and shower chair  PLOF: Requires assistive device for independence  PATIENT GOALS: "to not be off balance"  OBJECTIVE:  Note: Objective measures were completed at Evaluation unless otherwise noted.  DIAGNOSTIC FINDINGS: 10/06/23 MRA head IMPRESSION: Pericallosal aneurysm is not included in the standard MRA field of view.   Patent stented left vertebral artery with stable small dilatation at the PICA origin.    COGNITION: Overall cognitive status: Within functional limits for tasks assessed   SENSATION: Light touch: Impaired  R hemi body   COORDINATION: R LE spastic   POSTURE: No Significant postural limitations   BED MOBILITY:  Independent   TRANSFERS: Assistive device utilized: Single point cane  Sit to stand: Modified independence Stand to sit: Modified independence   GAIT: Gait pattern: step to pattern, decreased arm swing- Right, decreased step length- Left,  decreased stance time- Right, decreased hip/knee flexion- Right, decreased ankle dorsiflexion- Right, circumduction- Right, Right foot flat, knee flexed in stance- Right, ataxic, trunk rotated posterior- Right, and wide BOS Distance walked: clinic Assistive device utilized: Single point cane Level of assistance: Modified independence   FUNCTIONAL TESTS:   [facility] PT Assessment - 10/20/23 0001       Standardized Balance Assessment   Standardized Balance Assessment Timed Up and Go Test    Five times sit to stand comments  21.87   CGA   10 Meter Walk .30m/s   no AD     Timed Up and Go Test   Normal TUG (seconds) 19.8   no AD                                                                                                                                        TREATMENT  Orthotic fit: -trialed R ottobock   -allowed for too much ankle inversion in stance + gait and triggered increased toe clawing and extensor tone due to decreased rigidity of brace -trialed thusane sprystep, but given tone, unable to fully don  -  discussed referral to [facility] to receive R AFO   PATIENT EDUCATION: Education details: see above, PT POC Person educated: Patient Education method: Medical illustrator Education comprehension: verbalized understanding GOALS: Not indicated as patient does not wish to participate in skilled PT at this time  ASSESSMENT:  CLINICAL IMPRESSION: Patient is a 68 y.o. female who was seen today for physical therapy evaluation and treatment for assessment for an AFO. She has received multiple AFOs previously, but none in the past 5 years. She's currently wearing a lace up ASO to help prevent inversion, but does not promote ankle dorsiflexion. She would benefit from a rigid AFO, likely with supination strap to improve the efficiency and safety of her gait. Five times Sit to Stand Test (FTSS) Method: Use a straight back chair with a solid seat that is 17-18" high. Ask  participant to sit on the chair with arms folded across their chest.   Instructions: "Stand up and sit down as quickly as possible 5 times, keeping your arms folded across your chest."   Measurement: Stop timing when the participant touches the chair in sitting the 5th time.  TIME: 21.87 sec  Cut off scores indicative of increased fall risk: >12 sec CVA, >16 sec PD, >13 sec vestibular (ANPTA Core Set of Outcome Measures for Adults with Neurologic Conditions, 2018). 10 Meter Walk Test: Patient instructed to walk 10 meters (32.8 ft) as quickly and as safely as possible at their normal speed x2 and at a fast speed x2. Time measured from 2 meter mark to 8 meter mark to accommodate ramp-up and ramp-down.  Normal speed: .53m/s Cut off scores: <0.4 m/s = household Ambulator, 0.4-0.8 m/s = limited community Ambulator, >0.8 m/s = community Ambulator, >1.2 m/s = crossing a street, <1.0 = increased fall risk MCID 0.05 m/s (small), 0.13 m/s (moderate), 0.06 m/s (significant)  (ANPTA Core Set of Outcome Measures for Adults with Neurologic Conditions, 2018). Patient completed the Timed Up and Go test (TUG) in 19.8 seconds.  Geriatrics: need for further assessment of fall risk: >/= 12 sec; Recurrent falls: > 15 sec; Vestibular Disorders fall risk: > 15 sec; Parkinson's Disease fall risk: > 16 sec ([email or web address], 2023). Patient declines participating in skilled PT at this time.    CLINICAL DECISION MAKING: Stable/uncomplicated  EVALUATION COMPLEXITY: Low  PLAN:  PT FREQUENCY: one time visit  PT DURATION: other: 1x visit  PLANNED INTERVENTIONS: 97164- PT Re-evaluation, 97110-Therapeutic exercises, 97530- Therapeutic activity, [id number]- Neuromuscular re-education, 97535- Self Care, 57846- Manual therapy, [phone]- Gait training, [phone]- Orthotic Fit/training, Patient/Family education, Balance training, and DME instructions    Westley Foots, PT Westley Foots, PT, DPT, CBIS  10/20/2023, 2:17  PM

## 2023-10-20 NOTE — Telephone Encounter (Signed)
Ihor Austin, NP,   Laretta Bolster was seen by PT to assess for need for an AFO.  She will benefit from use of a R AFO in order to improve safety with functional mobility.    If you agree, please submit request in EPIC under MD Order, Other Orders (list R AFO in comments) or fax to [facility] at [phone].   Thank you, Westley Foots, PT, DPT, [facility] [address] [place], [place]  [address] Phone:  [phone] Fax:  [phone]

## 2023-10-22 ENCOUNTER — Other Ambulatory Visit: Payer: Self-pay | Admitting: Internal Medicine

## 2023-10-23 NOTE — Telephone Encounter (Signed)
Order placed as requested.  Thank you. 

## 2023-10-25 ENCOUNTER — Ambulatory Visit: Payer: Self-pay | Admitting: Internal Medicine

## 2023-10-25 ENCOUNTER — Ambulatory Visit: Payer: Medicare Other

## 2023-10-25 DIAGNOSIS — Z0000 Encounter for general adult medical examination without abnormal findings: Secondary | ICD-10-CM

## 2023-10-25 NOTE — Progress Notes (Signed)
 Subjective:   [name] is a 68 y.o. female who presents for Medicare Annual (Subsequent) preventive examination.  Visit Complete: Virtual I connected with  Catricia S Clucas on 10/25/23 by a audio enabled telemedicine application and verified that I am speaking with the correct person using two identifiers. Interactive audio and video telecommunications were attempted between this provider and patient, however failed, due to patient having technical difficulties OR patient did not have access to video capability.  We continued and completed visit with audio only.  Patient Location: Home  Provider Location: Home Office  I discussed the limitations of evaluation and management by telemedicine. The patient expressed understanding and agreed to proceed.  Vital Signs: Because this visit was a virtual/telehealth visit, some criteria may be missing or patient reported. Any vitals not documented were not able to be obtained and vitals that have been documented are patient reported.    Cardiac Risk Factors include: advanced age (>47men, >89 women);dyslipidemia;hypertension     Objective:    Today's Vitals   There is no height or weight on file to calculate BMI.     10/25/2023    2:06 PM 10/20/2023    1:18 PM 09/28/2022    2:48 PM 09/22/2021    2:08 PM 09/02/2021    9:55 AM 04/08/2021   10:08 AM 10/16/2020   11:04 AM  Advanced Directives  Does Patient Have a Medical Advance Directive? Yes Yes Yes Yes Yes Yes Yes  Type of Estate agent of [place];Living will  Healthcare Power of [place];Living will Healthcare Power of [place];Living will Healthcare Power of [place];Living will Healthcare Power of [place];Living will Healthcare Power of [place];Living will  Does patient want to make changes to medical advance directive?      No - Patient declined   Copy of Healthcare Power of Attorney in Chart? No - copy requested  No - copy requested No - copy  requested No - copy requested  No - copy requested    Current Medications (verified) Outpatient Encounter Medications as of 10/25/2023  Medication Sig   albuterol (PROVENTIL) (2.5 MG/3ML) 0.083% nebulizer solution Take 3 mLs (2.5 mg total) by nebulization every 6 (six) hours.   albuterol (VENTOLIN HFA) 108 (90 Base) MCG/ACT inhaler Inhale 2 puffs into the lungs every 6 (six) hours as needed for wheezing or shortness of breath.   aspirin EC 81 MG tablet Take 81 mg by mouth daily.   Biotin 1 MG CAPS Take 1 tablet by mouth 2 (two) times daily. Viviscal   Calcium-Magnesium-Zinc (CAL-MAG-ZINC PO) Take 1 tablet by mouth daily.   Cholecalciferol (VITAMIN D-3) 1000 units CAPS Take 2,000 Units by mouth at bedtime.   clopidogrel (PLAVIX) 75 MG tablet TAKE 1 TABLET DAILY   diclofenac Sodium (VOLTAREN) 1 % GEL Voltaren 1 % topical gel  APPLY 2 GRAM TO THE AFFECTED AREA(S) BY TOPICAL ROUTE 4 TIMES PER DAY   escitalopram (LEXAPRO) 10 MG tablet Take 1 tablet (10 mg total) by mouth at bedtime.   fluticasone (FLONASE) 50 MCG/ACT nasal spray Place 2 sprays into both nostrils daily as needed (allergies or post-nasl drip).   levETIRAcetam (KEPPRA) 500 MG tablet Take 1 tablet (500 mg total) by mouth every 12 (twelve) hours.   levothyroxine (SYNTHROID) 50 MCG tablet TAKE 1 TABLET ON SATURDAY AND SUNDAY   levothyroxine (SYNTHROID) 75 MCG tablet TAKE 1 TABLET IN THE MORNING BEFORE BREAKFAST MONDAY THROUGH FRIDAY (TAKE 50 MCG ON OTHER DAYS)   Multiple Vitamins-Minerals (PRESERVISION/LUTEIN PO)  Take 1 tablet by mouth daily.   NONFORMULARY OR COMPOUNDED ITEM Apply 240 mg topically daily as needed.   Omega-3 Fatty Acids (FISH OIL) 1000 MG CAPS Take 2,000 mg by mouth 2 (two) times daily.    Polyethyl Glycol-Propyl Glycol (SYSTANE) 0.4-0.3 % SOLN Place 1 drop into both eyes daily as needed (dry eyes).   Psyllium (FIBER) 28.3 % POWD Take 10 mLs by mouth 2 (two) times daily.   rosuvastatin (CRESTOR) 20 MG tablet TAKE 1  TABLET DAILY (REPLACES 10 MG DOSE)   telmisartan (MICARDIS) 20 MG tablet Take 1 tablet (20 mg total) by mouth daily.   tiZANidine (ZANAFLEX) 4 MG tablet TAKE 1 TABLET EVERY EVENING AS NEEDED   traZODone (DESYREL) 50 MG tablet Take 1.5 tablets (75 mg total) by mouth at bedtime as needed for sleep.   umeclidinium-vilanterol (ANORO ELLIPTA) 62.5-25 MCG/ACT AEPB Inhale 1 puff into the lungs daily.   vitamin C (ASCORBIC ACID) 500 MG tablet Take 1,000 mg by mouth at bedtime. Rose hips   clonazePAM (KLONOPIN) 0.5 MG tablet Take 0.5 tablets (0.25 mg total) by mouth daily as needed for anxiety.   loratadine (CLARITIN) 10 MG tablet Take 1 tablet (10 mg total) by mouth daily.   No facility-administered encounter medications on file as of 10/25/2023.    Allergies (verified) Phenobarbital and Phenytoin   History: Past Medical History:  Diagnosis Date   Abnormal gait    Amnesia    Anemia    hx   Aneurysm (HCC)    Anxiety    Asthma    Atrophic vaginitis    Benign hypertensive heart disease    Cardiomegaly    Chest pain    COPD (chronic obstructive pulmonary disease) (HCC)     - PFTs  01/24/06 FEV1 69% ratio 68% diffusing capacity 57% with no improvement after B2   - PFT's 10/1/ 09 FEV1 77   ratio  76    dlco                   45            no resp to B2    - Nl alpha one antitripsin level 10/09   Cough    Depression    Difficulty speaking    Disorder of nasal cavity    Dyspnea    Enthesopathy of elbow region    GERD (gastroesophageal reflux disease)    Hammer toe    Headache(784.0)    Hemiparesis affecting dominant side as late effect of cerebrovascular accident (HCC)    History of kidney stones    Hyperlipidemia    Hypertension    Hypothyroidism    Increased urinary frequency    Low compliance bladder    Macular degeneration of both eyes    Malaise and fatigue    Nonruptured cerebral aneurysm    Obesity    Pneumonia    hx   Pure hypercholesterolemia    Recurrent major depressive  episodes (HCC)    Seizure disorder, secondary (HCC)    2-3 focal siezures monthly   Seizures (HCC)    Short-term memory loss    since stroke   Skin sensation disturbance    Stroke [facility]) 2011   rt sided weakness   Swelling of limb    TMJ (temporomandibular joint disorder)    Urinary tract infectious disease    Vertigo as late effect of stroke    Vitamin D deficiency    Past Surgical History:  Procedure Laterality Date   ANTERIOR CERVICAL DECOMP/DISCECTOMY FUSION N/A 09/09/2021   Procedure: Anterior  Cervical Decompression Fusion  Cervical four-five, Cervical five-six;  Surgeon: Bethann Goo, DO;  Location: [facility] OR;  Service: Neurosurgery;  Laterality: N/A;   BACK SURGERY     CARPAL TUNNEL RELEASE Left 09/09/2021   Procedure: LEFT CARPAL TUNNEL RELEASE;  Surgeon: Bethann Goo, DO;  Location: [facility] OR;  Service: Neurosurgery;  Laterality: Left;   CEREBRAL ANGIOGRAM     x3   COLONOSCOPY WITH PROPOFOL N/A 08/18/2016   Procedure: COLONOSCOPY WITH PROPOFOL;  Surgeon: Jeani Hawking, MD;  Location: [facility] ENDOSCOPY;  Service: Endoscopy;  Laterality: N/A;   LAPAROSCOPIC HYSTERECTOMY     MANDIBLE FRACTURE SURGERY     RADIOLOGY WITH ANESTHESIA N/A 08/21/2013   Procedure: RADIOLOGY WITH ANESTHESIA;  Surgeon: Oneal Grout, MD;  Location: [facility] OR;  Service: Radiology;  Laterality: N/A;   stones     no surgery   Family History  Problem Relation Age of Onset   Heart disease Mother    Heart disease Father    Heart attack Father    Cancer Father        prostate to lymph node   Breast cancer Maternal Aunt    Breast cancer Cousin    Breast cancer Cousin    Lung cancer Other    Social History   Socioeconomic History   Marital status: Married    Spouse name: Windy Fast   Number of children: 2   Years of education: 14   Highest education level: Associate degree: academic program  Occupational History   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    Drug use: No   Sexual activity: Not Currently  Other Topics Concern   Not on file  Social History Narrative   Patient lives at home with her husband Windy Fast).   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    Patient is on disability.    Patient has an Associates degree.   Patient is right-handed.   Patient drinks 2 cups of green tea most days.               Social Drivers of Corporate investment banker Strain: Low Risk  (10/25/2023)   Overall Financial Resource Strain (CARDIA)    Difficulty of Paying Living Expenses: Not hard at all  Food Insecurity: No Food Insecurity (10/25/2023)   Hunger Vital Sign    Worried About Running Out of Food in the Last Year: Never true    Ran Out of Food in the Last Year: Never true  Transportation Needs: No Transportation Needs (10/25/2023)   PRAPARE - Administrator, Civil Service (Medical): No    Lack of Transportation (Non-Medical): No  Physical Activity: Sufficiently  Active (10/25/2023)   Exercise Vital Sign    Days of Exercise per Week: 4 days    Minutes of Exercise per Session: 60 min  Stress: Stress Concern Present (10/25/2023)   [employer] of Occupational Health - Occupational Stress Questionnaire    Feeling of Stress : To some extent  Social Connections: Moderately Isolated (10/25/2023)   Social Connection and Isolation Panel [NHANES]    Frequency of Communication with Friends and Family: More  than three times a week    Frequency of Social Gatherings with Friends and Family: Twice a week    Attends Religious Services: Never    Database administrator or Organizations: No    Attends Engineer, structural: Never    Marital Status: Married    Tobacco Counseling Counseling given: Not Answered Tobacco comments: smoked 2.5 ppd x 5 years, 1ppd x 20+ years   Clinical Intake:  Pre-visit preparation completed: Yes  Pain : No/denies pain     Nutritional Risks: None Diabetes: No  How often do you need to have someone help you when you read instructions, pamphlets, or other written materials from your doctor or pharmacy?: 1 - Never  Interpreter Needed?: No  Information entered by :: NAllen LPN   Activities of Daily Living    10/25/2023    1:57 PM  In your present state of health, do you have any difficulty performing the following activities:  Hearing? 0  Vision? 1  Comment gets shots in right eye  Difficulty concentrating or making decisions? 1  Comment due to stroke  Walking or climbing stairs? 1  Dressing or bathing? 0  Doing errands, shopping? 0  Preparing Food and eating ? N  Using the Toilet? N  In the past six months, have you accidently leaked urine? Y  Do you have problems with loss of bowel control? N  Managing your Medications? N  Managing your Finances? N  Housekeeping or managing your Housekeeping? N    Patient Care Team: Dorothyann Peng, MD as PCP - General (Internal Medicine) Jodelle Red, MD as PCP - Cardiology (Cardiology) Maeola Sarah, MD as Referring Physician (Ophthalmology)  Indicate any recent Medical Services you may have received from other than [facility] providers in the past year (date may be approximate).     Assessment:   This is a routine wellness examination for [place].  Hearing/Vision screen Hearing Screening - Comments:: Denies hearing issues Vision Screening - Comments:: Regular eye exams, Dr. Lelan Pons, Dr.  Jimmey Ralph   Goals Addressed             This Visit's Progress    Patient Stated       10/25/2023, wants to lose weight and wants to improve balance       Depression Screen    10/25/2023    2:10 PM 07/25/2023   12:03 PM 03/23/2023   11:47 AM 11/21/2022   12:45 PM 09/28/2022    2:49 PM 07/04/2022    1:53 PM 05/18/2022    8:45 AM  PHQ 2/9 Scores  PHQ - 2 Score 0 2 4 0 0  6  PHQ- 9 Score 3 7 9 5   17      Information is confidential and restricted. Go to Review Flowsheets to unlock data.    Fall Risk    10/25/2023    2:09 PM 10/11/2023   10:56 AM 03/23/2023   11:47 AM 09/28/2022    2:49 PM 05/18/2022    8:44 AM  Fall Risk  Falls in the past year? 1 1 0 0 1  Comment due to balance      Number falls in past yr: 1 1 0 0 1  Injury with Fall? 1 0 0 0 0  Comment black eye      Risk for fall due to : Impaired balance/gait;Impaired mobility;Medication side effect History of fall(s);Impaired balance/gait No Fall Risks Medication side effect;Impaired mobility;Impaired balance/gait Impaired balance/gait  Follow up Falls prevention discussed;Falls evaluation completed  Falls evaluation completed Falls prevention discussed;Education provided;Falls evaluation completed Falls evaluation completed    MEDICARE RISK AT HOME: Medicare Risk at Home Any stairs in or around the home?: Yes If so, are there any without handrails?: No Home free of loose throw rugs in walkways, pet beds, electrical cords, etc?: Yes Adequate lighting in your home to reduce risk of falls?: Yes Life alert?: No Use of a cane, walker or w/c?: Yes Grab bars in the bathroom?: No Shower chair or bench in shower?: Yes Elevated toilet seat or a handicapped toilet?: No  TIMED UP AND GO:  Was the test performed?  No    Cognitive Function:    09/03/2018    2:39 PM 11/03/2015   11:00 AM 05/05/2015   10:47 AM 11/05/2014    9:45 AM  MMSE - Mini Mental State Exam  Orientation to time 4 4 5 4   Orientation to Place 5 4 4 5    Registration 3 3 3 3   Attention/ Calculation 5 5 4 5   Recall 3 2 1 2   Language- name 2 objects 2 2 2 2   Language- repeat 1 1 1 1   Language- follow 3 step command 3 3 3 3   Language- read & follow direction 1 1 1 1   Write a sentence 1 1 1 1   Copy design 1 1 1 1   Total score 29 27 26 28         10/25/2023    2:12 PM 09/28/2022    2:51 PM 09/22/2021    2:14 PM 09/16/2020    9:30 AM 09/11/2019    2:57 PM  6CIT Screen  What Year? 0 points 0 points 0 points 0 points 0 points  What month? 0 points 0 points 0 points 0 points 0 points  What time? 0 points 0 points 0 points 0 points 0 points  Count back from 20 0 points 0 points 0 points 0 points 0 points  Months in reverse 0 points 0 points 0 points 0 points 0 points  Repeat phrase 2 points 4 points 2 points 4 points 0 points  Total Score 2 points 4 points 2 points 4 points 0 points    Immunizations Immunization History  Administered Date(s) Administered   Fluad Quad(high Dose 65+) 05/18/2022   Influenza Whole 05/24/2008   Influenza,inj,Quad PF,6+ Mos 06/22/2017, 07/23/2018, 05/20/2019   Influenza-Unspecified 06/20/2018, 06/17/2020, 05/31/2023   PFIZER(Purple Top)SARS-COV-2 Vaccination 11/22/2019, 12/13/2019, 06/17/2020, 01/11/2021, 05/31/2023   PNEUMOCOCCAL CONJUGATE-20 05/18/2022   Pfizer Covid-19 Vaccine Bivalent Booster 91yrs & up 07/08/2021   Pneumococcal Polysaccharide-23 03/16/2021   Td 05/24/2008   Tdap 03/23/2023   Zoster Recombinant(Shingrix) 06/22/2017, 09/01/2017    TDAP status: Up to date  Flu Vaccine status: Up to date  Pneumococcal vaccine status: Up to date  Covid-19 vaccine status: Information provided on how to obtain vaccines.   Qualifies for Shingles Vaccine? Yes   Zostavax completed Yes   Shingrix Completed?: Yes  Screening Tests Health Maintenance  Topic Date Due   COVID-19 Vaccine (7 -  2024-25 season) 01/08/2024 (Originally 07/26/2023)   Lung Cancer Screening  08/15/2024   MAMMOGRAM  10/24/2024    Medicare Annual Wellness (AWV)  10/24/2024   Colonoscopy  08/18/2026   DTaP/Tdap/Td (3 - Td or Tdap) 03/22/2033   Pneumonia Vaccine 35+ Years old  Completed   INFLUENZA VACCINE  Completed   DEXA SCAN  Completed   Hepatitis C Screening  Completed   Zoster Vaccines- Shingrix  Completed   HPV VACCINES  Aged Out    Health Maintenance  There are no preventive care reminders to display for this patient.   Colorectal cancer screening: Type of screening: Colonoscopy. Completed 08/18/2016. Repeat every 10 years  Mammogram status: scheduled for 10/27/2023  Bone Density status: scheduled for 11/21/2023  Lung Cancer Screening: (Low Dose CT Chest recommended if Age 64-80 years, 20 pack-year currently smoking OR have quit w/in 15years.) does qualify.   Lung Cancer Screening Referral: CT scan 08/16/2023  Additional Screening:  Hepatitis C Screening: does qualify; Completed 09/07/2012  Vision Screening: Recommended annual ophthalmology exams for early detection of glaucoma and other disorders of the eye. Is the patient up to date with their annual eye exam?  Yes  Who is the provider or what is the name of the office in which the patient attends annual eye exams? [facility] If pt is not established with a provider, would they like to be referred to a provider to establish care? No .   Dental Screening: Recommended annual dental exams for proper oral hygiene  Diabetic Foot Exam: n/a  Community Resource Referral / Chronic Care Management: CRR required this visit?  No   CCM required this visit?  No     Plan:     I have personally reviewed and noted the following in the patient's chart:   Medical and social history Use of alcohol, tobacco or illicit drugs  Current medications and supplements including opioid prescriptions. Patient is not currently taking opioid prescriptions. Functional ability and status Nutritional status Physical activity Advanced directives List of  other physicians Hospitalizations, surgeries, and ER visits in previous 12 months Vitals Screenings to include cognitive, depression, and falls Referrals and appointments  In addition, I have reviewed and discussed with patient certain preventive protocols, quality metrics, and best practice recommendations. A written personalized care plan for preventive services as well as general preventive health recommendations were provided to patient.     Barb Merino, LPN   1/61/0960   After Visit Summary: (MyChart) Due to this being a telephonic visit, the after visit summary with patients personalized plan was offered to patient via MyChart   Nurse Notes: none

## 2023-10-25 NOTE — Patient Instructions (Signed)
 [name] , Thank you for taking time to come for your Medicare Wellness Visit. I appreciate your ongoing commitment to your health goals. Please review the following plan we discussed and let me know if I can assist you in the future.   Referrals/Orders/Follow-Ups/Clinician Recommendations: none  This is a list of the screening recommended for you and due dates:  Health Maintenance  Topic Date Due   COVID-19 Vaccine (7 - 2024-25 season) 01/08/2024*   Screening for Lung Cancer  08/15/2024   Mammogram  10/24/2024   Medicare Annual Wellness Visit  10/24/2024   Colon Cancer Screening  08/18/2026   DTaP/Tdap/Td vaccine (3 - Td or Tdap) 03/22/2033   Pneumonia Vaccine  Completed   Flu Shot  Completed   DEXA scan (bone density measurement)  Completed   Hepatitis C Screening  Completed   Zoster (Shingles) Vaccine  Completed   HPV Vaccine  Aged Out  *Topic was postponed. The date shown is not the original due date.    Advanced directives: (Copy Requested) Please bring a copy of your health care power of attorney and living will to the office to be added to your chart at your convenience.  Next Medicare Annual Wellness Visit scheduled for next year: No, office will schedule  insert Preventive Care attachment Insert FALL PREVENTION attachment if needed

## 2023-10-27 ENCOUNTER — Ambulatory Visit
Admission: RE | Admit: 2023-10-27 | Discharge: 2023-10-27 | Disposition: A | Discharge status: Home / Self Care | Payer: Medicare Other | Source: Ambulatory Visit | Attending: Internal Medicine | Admitting: Internal Medicine

## 2023-10-27 DIAGNOSIS — Z1231 Encounter for screening mammogram for malignant neoplasm of breast: Secondary | ICD-10-CM

## 2023-11-06 NOTE — Progress Notes (Deleted)
 [facility] MD/PA/NP OP Progress Note  11/06/2023 9:50 AM [name]  MRN:  [id number]  Visit Diagnosis:  No diagnosis found.  Assessment: [name] is a 68 y.o. 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    During initial evaluation patient reported symptoms of depression including anhedonia, feelings of hopelessness, increased fatigue, decreased appetite, difficulty sleeping, difficulty concentrating, and feelings of worthlessness.  She denied any SI/HI or thoughts of self-harm along with any past history of mania or psychosis.  Patient also endorsed symptoms of anxiety including feeling nervous or on edge, being unable to control her worrying, worrying too much with different things, difficulty relaxing, and irritability.  Of note patient had a left frontoparietal parenchymal hemorrhage in March 2011 which correlated with the onset of her cognitive impairment, onset of seizures, and onset of depressive symptoms.  Patient was started on Keppra around that time and we discussed the possibility that it could be contributing to her increased irritability/agitation.    [name] presents for follow-up evaluation. Today, 11/06/23, patient reports    that mood has been fairly stable other then increase irritability and agitation. This appears to be related to some interpersonal stressors particularly with her husband. It is unlikely this agitation is related to her medications. We discussed having a family meeting at her next session which patient.  Patient has continued her medications without any adverse side effects.  She has only used Klonopin twice in the interim.  She has had a couple of falls though these occurred outside of the times her Klonopin use.  We will continue on her current regimen and  follow-up in a month.   Plan: - Continue Lexapro (escitalopram) to 10 mg at bedtime  - Continue trazodone to 50-100 mg at bedtime as needed - Continue Klonopin 0.25 QD prn for anxiety - EKG reviewed QTC 462 - Discuss Keppra with neurologist - Continue Grief counseling - CBC, CMP, lipid profile, Vit D, and TSH reviewed - Follow up in 6 weeks  Chief Complaint:  No chief complaint on file.  HPI: [name] presents reporting that   mood is not that bad most of the time. But she has found that she can get more agitated then usual when something is said that is rude or dismissive to her. It is often in the context of her partner making her feel dumb. Wendy describes her agitation as yelling and cursing. The episodes can last 10-15 and then she would go back and apologize. She has some frustration with his lack of understanding for her strokes and how it has effected her cognition/processing speed.   [name] feels like her mood lability increased after the stroke and then again after her sons death.  We did review how stroke can impact these symptoms.  We also reviewed how ongoing grief can increase emotional lability.   We will plan to have a family session at her next appointment to help patient convey her feelings to husband and the expectations after a stroke.  Outside of this patient reports things have gone alright. Christmas ended up going better then she expected. She managed to get through, and while she missed her son it was not as bad as she had been expecting.   Medication wise she continues to take the Lexapro consistently alongside the trazodone.  She only used the [employer]  a couple of times when she was feeling particularly overwhelmed.  While patient has had a couple of falls in the interim both of these were outside of times that she used the Klonopin.  That said likely the falls were secondary to balance issues caused by her prior stroke.    Past Psychiatric History: Patient  reports that her psychiatric symptoms/depression started after 2011 when she had her stroke prior to that she reports minimal to no depressive symptoms.  She has been on Celexa for as long as she can remember post stroke and believes she was started on Xanax at the same time.  Patient denies any prior psychiatric hospitalizations or past suicide attempts.  She denies any current substance use since 2005 and reports drinking alcohol when she was younger.  Past Medical History:  Past Medical History:  Diagnosis Date   Abnormal gait    Amnesia    Anemia    hx   Aneurysm (HCC)    Anxiety    Asthma    Atrophic vaginitis    Benign hypertensive heart disease    Cardiomegaly    Chest pain    COPD (chronic obstructive pulmonary disease) (HCC)     - PFTs  01/24/06 FEV1 69% ratio 68% diffusing capacity 57% with no improvement after B2   - PFT's 10/1/ 09 FEV1 77   ratio  76    dlco                   45            no resp to B2    - Nl alpha one antitripsin level 10/09   Cough    Depression    Difficulty speaking    Disorder of nasal cavity    Dyspnea    Enthesopathy of elbow region    GERD (gastroesophageal reflux disease)    Hammer toe    Headache(784.0)    Hemiparesis affecting dominant side as late effect of cerebrovascular accident (HCC)    History of kidney stones    Hyperlipidemia    Hypertension    Hypothyroidism    Increased urinary frequency    Low compliance bladder    Macular degeneration of both eyes    Malaise and fatigue    Nonruptured cerebral aneurysm    Obesity    Pneumonia    hx   Pure hypercholesterolemia    Recurrent major depressive episodes (HCC)    Seizure disorder, secondary (HCC)    2-3 focal siezures monthly   Seizures (HCC)    Short-term memory loss    since stroke   Skin sensation disturbance    Stroke (HCC) 2011   rt sided weakness   Swelling of limb    TMJ (temporomandibular joint disorder)    Urinary tract infectious disease    Vertigo as late  effect of stroke    Vitamin D deficiency     Past Surgical History:  Procedure Laterality Date   ABDOMINAL HYSTERECTOMY     ANTERIOR CERVICAL DECOMP/DISCECTOMY FUSION N/A 09/09/2021   Procedure: Anterior  Cervical Decompression Fusion  Cervical four-five, Cervical five-six;  Surgeon: Bethann Goo, DO;  Location: [facility] OR;  Service: Neurosurgery;  Laterality: N/A;   BACK SURGERY     CARPAL TUNNEL RELEASE Left 09/09/2021   Procedure: LEFT CARPAL TUNNEL RELEASE;  Surgeon: Bethann Goo, DO;  Location: [facility] OR;  Service: Neurosurgery;  Laterality: Left;   CEREBRAL ANGIOGRAM     x3  COLONOSCOPY WITH PROPOFOL N/A 08/18/2016   Procedure: COLONOSCOPY WITH PROPOFOL;  Surgeon: Jeani Hawking, MD;  Location: [facility] END[facility];  Service: Endoscopy;  Laterality: N/A;   LAPAROSCOPIC HYSTERECTOMY     MANDIBLE FRACTURE SURGERY     RADIOLOGY WITH ANESTHESIA N/A 08/21/2013   Procedure: RADIOLOGY WITH ANESTHESIA;  Surgeon: Oneal Grout, MD;  Location: [facility] OR;  Service: Radiology;  Laterality: N/A;   stones     no surgery    Family Psychiatric History: Denies  Family History:  Family History  Problem Relation Age of Onset   Heart disease Mother    Heart disease Father    Heart attack Father    Prostate cancer Father        prostate to lymph node   Breast cancer Maternal Aunt    Breast cancer Cousin 12   Breast cancer Cousin 45   Lung cancer Other     Social History:  Social History   Socioeconomic History   Marital status: Married    Spouse name: Windy Fast   Number of children: 2   Years of education: 14   Highest education level: Associate degree: academic program  Occupational History   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    Drug use: No   Sexual activity: Not Currently  Other Topics Concern   Not on file  Social History Narrative   Patient lives at home with her husband Windy Fast).   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    Patient is on disability.    Patient has an Associates degree.   Patient is right-handed.   Patient drinks 2 cups of green tea most days.               Social Drivers of Corporate investment banker Strain: Low Risk  (10/25/2023)   Overall Financial Resource Strain (CARDIA)    Difficulty of Paying Living Expenses: Not hard at all  Food Insecurity: No Food Insecurity (10/25/2023)   Hunger Vital Sign    Worried About Running Out of Food in the Last Year: Never true    Ran Out of Food in the Last Year: Never true  Transportation Needs: No Transportation Needs (10/25/2023)   PRAPARE - Administrator, Civil Service (Medical): No    Lack of Transportation (Non-Medical): No  Physical Activity: Sufficiently Active (10/25/2023)   Exercise Vital Sign    Days of Exercise per Week: 4 days    Minutes of Exercise per Session: 60 min  Stress: Stress Concern Present (10/25/2023)   [employer] of Occupational Health - Occupational Stress Questionnaire    Feeling of Stress : To some extent  Social Connections: Moderately Isolated (10/25/2023)   Social Connection and Isolation Panel [NHANES]    Frequency of  Communication with Friends and Family: More than three times a week    Frequency of Social Gatherings with Friends and Family: Twice a week    Attends Religious Services: Never    Database administrator or Organizations: No    Attends Banker  Meetings: Never    Marital Status: Married    Allergies:  Allergies  Allergen Reactions   Phenobarbital Hives, Shortness Of Breath and Swelling   Phenytoin Anaphylaxis, Hives and Swelling    Current Medications: Current Outpatient Medications  Medication Sig Dispense Refill   albuterol (PROVENTIL) (2.5 MG/3ML) 0.083% nebulizer solution Take 3 mLs (2.5 mg total) by nebulization every 6 (six) hours. 1080 mL 1   albuterol (VENTOLIN HFA) 108 (90 Base) MCG/ACT inhaler Inhale 2 puffs into the lungs every 6 (six) hours as needed for wheezing or shortness of breath. 24 g 3   aspirin EC 81 MG tablet Take 81 mg by mouth daily.     Biotin 1 MG CAPS Take 1 tablet by mouth 2 (two) times daily. Viviscal     Calcium-Magnesium-Zinc (CAL-MAG-ZINC PO) Take 1 tablet by mouth daily.     Cholecalciferol (VITAMIN D-3) 1000 units CAPS Take 2,000 Units by mouth at bedtime.     clonazePAM (KLONOPIN) 0.5 MG tablet Take 0.5 tablets (0.25 mg total) by mouth daily as needed for anxiety. 15 tablet 1   clopidogrel (PLAVIX) 75 MG tablet TAKE 1 TABLET DAILY 90 tablet 3   diclofenac Sodium (VOLTAREN) 1 % GEL Voltaren 1 % topical gel  APPLY 2 GRAM TO THE AFFECTED AREA(S) BY TOPICAL ROUTE 4 TIMES PER DAY 350 g 2   escitalopram (LEXAPRO) 10 MG tablet Take 1 tablet (10 mg total) by mouth at bedtime. 90 tablet 1   fluticasone (FLONASE) 50 MCG/ACT nasal spray Place 2 sprays into both nostrils daily as needed (allergies or post-nasl drip).  3   levETIRAcetam (KEPPRA) 500 MG tablet Take 1 tablet (500 mg total) by mouth every 12 (twelve) hours. 180 tablet 3   levothyroxine (SYNTHROID) 50 MCG tablet TAKE 1 TABLET ON SATURDAY AND SUNDAY 26 tablet 3   levothyroxine (SYNTHROID) 75 MCG tablet TAKE 1 TABLET IN THE MORNING BEFORE BREAKFAST MONDAY THROUGH FRIDAY (TAKE 50 MCG ON OTHER DAYS) 51 tablet 4   loratadine (CLARITIN) 10 MG tablet Take 1 tablet (10 mg total) by mouth daily. 30 tablet 2   Multiple Vitamins-Minerals  (PRESERVISION/LUTEIN PO) Take 1 tablet by mouth daily.     NONFORMULARY OR COMPOUNDED ITEM Apply 240 mg topically daily as needed. 1 each 3   Omega-3 Fatty Acids (FISH OIL) 1000 MG CAPS Take 2,000 mg by mouth 2 (two) times daily.      Polyethyl Glycol-Propyl Glycol (SYSTANE) 0.4-0.3 % SOLN Place 1 drop into both eyes daily as needed (dry eyes).     Psyllium (FIBER) 28.3 % POWD Take 10 mLs by mouth 2 (two) times daily.     rosuvastatin (CRESTOR) 20 MG tablet TAKE 1 TABLET DAILY (REPLACES 10 MG DOSE) 90 tablet 3   telmisartan (MICARDIS) 20 MG tablet Take 1 tablet (20 mg total) by mouth daily. 90 tablet 3   tiZANidine (ZANAFLEX) 4 MG tablet TAKE 1 TABLET EVERY EVENING AS NEEDED 60 tablet 5   traZODone (DESYREL) 50 MG tablet Take 1.5 tablets (75 mg total) by mouth at bedtime as needed for sleep. 135 tablet 0   umeclidinium-vilanterol (ANORO ELLIPTA) 62.5-25 MCG/ACT AEPB Inhale 1 puff into the lungs daily. 30 each 3  vitamin C (ASCORBIC ACID) 500 MG tablet Take 1,000 mg by mouth at bedtime. Rose hips     No current facility-administered medications for this visit.     Musculoskeletal: Strength & Muscle Tone: decreased and right hemiparesis secondary to past stroke Gait & Station: unsteady Patient leans: Left  Psychiatric Specialty Exam: Review of Systems  There were no vitals taken for this visit.There is no height or weight on file to calculate BMI.  General Appearance: Well Groomed  Eye Contact:  Good  Speech:  Clear and Coherent and Normal Rate  Volume:  Normal  Mood:   mildly depressed  Affect:  Congruent  Thought Process:  Coherent  Orientation:  Full (Time, Place, and Person)  Thought Content: Logical, Illogical, and Rumination   Suicidal Thoughts:  No  Homicidal Thoughts:  No  Memory:  Immediate;   Fair  Judgement:  Good  Insight:  Good  Psychomotor Activity:  Normal  Concentration:  Concentration: Good  Recall:  Fair  Fund of Knowledge: Fair  Language: Good  Akathisia:   NA    AIMS (if indicated): not done  Assets:  Communication Skills Desire for Improvement Housing Intimacy Resilience Social Support Transportation Vocational/Educational  ADL's:  Intact  Cognition: WNL  Sleep:  Fair   Metabolic Disorder Labs: Lab Results  Component Value Date   HGBA1C 5.8 (H) 07/25/2023   No results found for: "PROLACTIN" Lab Results  Component Value Date   CHOL 149 03/23/2023   TRIG 103 03/23/2023   HDL 74 03/23/2023   CHOLHDL 2.0 03/23/2023   VLDL 15 11/23/2009   LDLCALC 57 03/23/2023   LDLCALC 35 05/18/2022   Lab Results  Component Value Date   TSH 0.824 03/23/2023   TSH 1.500 09/28/2022    Therapeutic Level Labs: No results found for: "LITHIUM" No results found for: "VALPROATE" No results found for: "CBMZ"   Screenings: GAD-7    Flowsheet Row [facility]04/20[facility]  Total GAD-7 Score 16      Mini-Mental    Flowsheet Row Clinical Support from 09/03/2018 in [facility] Office Visit from 11[facility]03/2015 in [facility] Office Visit from 05/05/2015 in [facility] Office Visit from 11/05/2014 in [facility]  Total Score (max 30 points ) 29 27 26 28       PHQ2-9    Flowsheet Row Clinical Support from 10/25/2023 in [facility] Office Visit from 07/25/2023 in [facility] Office Visit from 03/23/2023 in [facility] Office Visit from 11/21/2022 in [facility] Clinical Support from 09/28/2022 in [facility]  PHQ-2 Total Score 0 2 4 0 0  PHQ-9 Total Score 3 7 9 5  --      Flowsheet Row ED from 08/09/2022 in [facility] Office Visit from 07/04/2022 in  [facility] Admission (Discharged) from 09/09/2021 in [facility]  C-SSRS RISK CATEGORY No Risk No Risk No Risk       Collaboration of Care: Collaboration of Care: Medication Management AEB medication prescription and Other provider involved in patient's care AEB PCP chart review  Patient/Guardian was advised Release of Information must be obtained prior to any record release in order to collaborate their care with an outside provider. Patient/Guardian was  advised if they have not already done so to contact the registration department to sign all necessary forms in order for [place] to release information regarding their care.   Consent: Patient/Guardian gives verbal consent for treatment and assignment of benefits for services provided during this visit. Patient/Guardian expressed understanding and agreed to proceed.    Stasia Cavalier, MD 11/06/2023, 9:50 AM

## 2023-11-07 ENCOUNTER — Ambulatory Visit (HOSPITAL_COMMUNITY): Payer: Medicare Other | Admitting: Psychiatry

## 2023-11-16 ENCOUNTER — Telehealth (HOSPITAL_COMMUNITY): Payer: Self-pay

## 2023-11-16 NOTE — Telephone Encounter (Signed)
 Pt agreed to f/u in 2 years with an mra. AB

## 2023-11-21 ENCOUNTER — Ambulatory Visit
Admission: RE | Admit: 2023-11-21 | Discharge: 2023-11-21 | Disposition: A | Discharge status: Home / Self Care | Payer: Medicare Other | Source: Ambulatory Visit | Attending: Internal Medicine | Admitting: Internal Medicine

## 2023-11-21 DIAGNOSIS — N958 Other specified menopausal and perimenopausal disorders: Secondary | ICD-10-CM | POA: Diagnosis not present

## 2023-11-21 DIAGNOSIS — Z90722 Acquired absence of ovaries, bilateral: Secondary | ICD-10-CM | POA: Diagnosis not present

## 2023-11-21 DIAGNOSIS — E2839 Other primary ovarian failure: Secondary | ICD-10-CM | POA: Diagnosis not present

## 2023-11-22 ENCOUNTER — Ambulatory Visit: Payer: Medicare Other | Admitting: Internal Medicine

## 2023-11-30 ENCOUNTER — Ambulatory Visit (INDEPENDENT_AMBULATORY_CARE_PROVIDER_SITE_OTHER): Payer: Medicare Other | Admitting: Pulmonary Disease

## 2023-11-30 ENCOUNTER — Encounter: Payer: Self-pay | Admitting: Adult Health

## 2023-11-30 ENCOUNTER — Encounter: Payer: Self-pay | Admitting: Pulmonary Disease

## 2023-11-30 VITALS — BP 130/76 | HR 103 | Ht 61.5 in | Wt 154.4 lb

## 2023-11-30 DIAGNOSIS — G4734 Idiopathic sleep related nonobstructive alveolar hypoventilation: Secondary | ICD-10-CM | POA: Diagnosis not present

## 2023-11-30 DIAGNOSIS — J449 Chronic obstructive pulmonary disease, unspecified: Secondary | ICD-10-CM | POA: Diagnosis not present

## 2023-11-30 MED ORDER — ANORO ELLIPTA 62.5-25 MCG/ACT IN AEPB: 1.0000 | INHALATION_SPRAY | Freq: Every day | RESPIRATORY_TRACT | 5 refills | Status: AC

## 2023-11-30 NOTE — Progress Notes (Signed)
 Subjective:    Patient ID: [name], female    DOB: [birth year], 68 y.o.   MRN: [id number]  Follow-up for chronic obstructive pulmonary disease  Her breathing has been relatively stable  Shortness of breath is stable  Has been going to the gym regularly and exercising regularly  Musculoskeletal limitations relating to previous stroke  Previous PFT with obstructive disease with significant bronchodilator response  She does have a longstanding history of COPD, previous stroke, she was using inhalers, nebulizers previously  Uses a cane for stability but she is able to use the treadmill when she goes to the gym   She does have daytime fatigue and sleepiness Multiple medications may be contributing to this  Comorbidities include hypertension, hypercholesterolemia, chronic headaches, CVA     Past Medical History:  Diagnosis Date   Abnormal gait    Amnesia    Anemia    hx   Aneurysm (HCC)    Anxiety    Asthma    Atrophic vaginitis    Benign hypertensive heart disease    Cardiomegaly    Chest pain    COPD (chronic obstructive pulmonary disease) (HCC)     - PFTs  01/24/06 FEV1 69% ratio 68% diffusing capacity 57% with no improvement after B2   - PFT's 10/1/ 09 FEV1 77   ratio  76    dlco                   45            no resp to B2    - Nl alpha one antitripsin level 10/09   Cough    Depression    Difficulty speaking    Disorder of nasal cavity    Dyspnea    Enthesopathy of elbow region    GERD (gastroesophageal reflux disease)    Hammer toe    Headache(784.0)    Hemiparesis affecting dominant side as late effect of cerebrovascular accident (HCC)    History of kidney stones    Hyperlipidemia    Hypertension    Hypothyroidism    Increased urinary frequency    Low compliance bladder    Macular degeneration of both eyes    Malaise and fatigue    Nonruptured cerebral aneurysm    Obesity    Pneumonia    hx   Pure hypercholesterolemia    Recurrent major  depressive episodes (HCC)    Seizure disorder, secondary (HCC)    2-3 focal siezures monthly   Seizures (HCC)    Short-term memory loss    since stroke   Skin sensation disturbance    Stroke (HCC) 2011   rt sided weakness   Swelling of limb    TMJ (temporomandibular joint disorder)    Urinary tract infectious disease    Vertigo as late effect of stroke    Vitamin D deficiency    Social History   Socioeconomic History   Marital status: Married    Spouse name: Windy Fast   Number of children: 2   Years of education: 14   Highest education level: Associate degree: academic program  Occupational History   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    Drug use: No   Sexual activity: Not Currently  Other Topics Concern   Not on file  Social History Narrative   Patient lives at home with her husband Windy Fast).   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    Patient is on disability.    Patient has an Associates degree.   Patient is right-handed.   Patient drinks 2 cups of green tea most days.               Social Drivers of Corporate investment banker Strain: Low Risk  (10/25/2023)   Overall Financial Resource Strain (CARDIA)    Difficulty of Paying Living Expenses: Not hard at all  Food Insecurity: No Food Insecurity (10/25/2023)   Hunger Vital Sign    Worried About Running Out of Food in the Last Year:  Never true    Ran Out of Food in the Last Year: Never true  Transportation Needs: No Transportation Needs (10/25/2023)   PRAPARE - Administrator, Civil Service (Medical): No    Lack of Transportation (Non-Medical): No  Physical Activity: Sufficiently Active (10/25/2023)   Exercise Vital Sign    Days of Exercise per Week: 4 days    Minutes of Exercise per Session: 60 min  Stress: Stress Concern Present (10/25/2023)   [employer] of Occupational Health - Occupational Stress Questionnaire    Feeling of Stress : To some extent  Social Connections: Moderately Isolated (10/25/2023)   Social Connection and Isolation Panel [NHANES]    Frequency of Communication with Friends and Family: More than three times a week    Frequency of Social Gatherings with Friends and Family: Twice a week    Attends Religious Services: Never    Database administrator or Organizations: No    Attends Banker Meetings: Never    Marital Status: Married  Catering manager Violence: Not At Risk (10/25/2023)   Humiliation, Afraid, Rape, and Kick questionnaire    Fear of Current or Ex-Partner: No    Emotionally Abused: No    Physically Abused: No    Sexually Abused: No   Family History  Problem Relation Age of Onset   Heart disease Mother    Heart disease Father    Heart attack Father    Prostate cancer Father        prostate to lymph node   Breast cancer Maternal Aunt    Breast cancer Cousin 45   Breast cancer Cousin 45   Lung cancer Other    Review of Systems  Constitutional:  Negative for fever and unexpected weight change.  HENT:  Negative for congestion, dental problem, ear pain, nosebleeds, postnasal drip, rhinorrhea, sinus pressure, sneezing, sore throat and trouble swallowing.   Eyes:  Negative for redness and itching.  Respiratory:  Positive for cough and shortness of breath. Negative for chest tightness and wheezing.   Cardiovascular:  Negative for palpitations.   Gastrointestinal:  Negative for nausea and vomiting.  Genitourinary:  Negative for dysuria.  Musculoskeletal:  Positive for joint swelling.  Skin:  Negative for rash.  Allergic/Immunologic: Negative.  Negative for environmental allergies, food allergies and immunocompromised state.  Psychiatric/Behavioral:  Negative for dysphoric mood.        Objective:   Physical Exam Constitutional:      Appearance: Normal appearance.  HENT:     Head: Normocephalic and atraumatic.     Mouth/Throat:     Mouth: Mucous membranes are moist.  Eyes:     General: No scleral icterus. Cardiovascular:     Rate and Rhythm: Normal rate and regular rhythm.     Pulses: Normal pulses.     Heart sounds: Normal heart sounds. No murmur heard.    No friction rub.  Pulmonary:     Effort: Pulmonary effort is normal. No respiratory distress.     Breath sounds: Normal breath sounds. No stridor. No wheezing or rhonchi.  Musculoskeletal:     Cervical back: Normal range of motion. No rigidity or tenderness.  Neurological:     Mental Status: She is alert.  Psychiatric:        Mood and Affect: Mood normal.    Vitals:   11/30/23 1102  BP: 130/76  Pulse: (!) 103  SpO2: 94%    Recent PFT reviewed showing small airway disease with significant bronchodilator response in the small airways Nocturnal oximetry with nocturnal desaturations  Low-dose CT scan of the chest performed January 2022-no lung nodules, some evidence of bronchiectasis    Assessment & Plan:  .  History of chronic obstructive pulm disease  .  Multifactorial shortness of breath  .  Anoro seems to be helping symptoms, Anoro refill sent to pharmacy  .  She is getting more active and going to the gym on a regular basis  .  Encouraged to use rescue inhaler as needed  .  She used to use oxygen at night, has not been using it regularly -We did discuss need for oxygen and agreed to repeat an overnight oximetry on room air to ascertain if there  is a need for continuing oxygen  Encouraged to stay active  Follow-up in 8 to 10 weeks

## 2023-11-30 NOTE — Patient Instructions (Signed)
 PFT on the day of next visit  Overnight oximetry on room air  Continue using Anoro Try and use Anoro daily  Continue regular exercises  Follow-up in about 8 to 10 weeks

## 2023-12-06 ENCOUNTER — Encounter: Payer: Self-pay | Admitting: Internal Medicine

## 2023-12-06 ENCOUNTER — Ambulatory Visit (INDEPENDENT_AMBULATORY_CARE_PROVIDER_SITE_OTHER): Admitting: Internal Medicine

## 2023-12-06 VITALS — BP 118/80 | HR 86 | Temp 98.5°F | Ht 60.0 in | Wt 154.2 lb

## 2023-12-06 DIAGNOSIS — H353211 Exudative age-related macular degeneration, right eye, with active choroidal neovascularization: Secondary | ICD-10-CM | POA: Diagnosis not present

## 2023-12-06 DIAGNOSIS — N3941 Urge incontinence: Secondary | ICD-10-CM | POA: Diagnosis not present

## 2023-12-06 DIAGNOSIS — R7309 Other abnormal glucose: Secondary | ICD-10-CM | POA: Diagnosis not present

## 2023-12-06 DIAGNOSIS — E039 Hypothyroidism, unspecified: Secondary | ICD-10-CM

## 2023-12-06 DIAGNOSIS — R1313 Dysphagia, pharyngeal phase: Secondary | ICD-10-CM | POA: Diagnosis not present

## 2023-12-06 DIAGNOSIS — Z809 Family history of malignant neoplasm, unspecified: Secondary | ICD-10-CM | POA: Diagnosis not present

## 2023-12-06 DIAGNOSIS — G8110 Spastic hemiplegia affecting unspecified side: Secondary | ICD-10-CM

## 2023-12-06 DIAGNOSIS — I700 Atherosclerosis of aorta: Secondary | ICD-10-CM

## 2023-12-06 DIAGNOSIS — F331 Major depressive disorder, recurrent, moderate: Secondary | ICD-10-CM | POA: Diagnosis not present

## 2023-12-06 DIAGNOSIS — Z79899 Other long term (current) drug therapy: Secondary | ICD-10-CM

## 2023-12-06 DIAGNOSIS — Z87891 Personal history of nicotine dependence: Secondary | ICD-10-CM

## 2023-12-06 DIAGNOSIS — G959 Disease of spinal cord, unspecified: Secondary | ICD-10-CM

## 2023-12-06 DIAGNOSIS — I119 Hypertensive heart disease without heart failure: Secondary | ICD-10-CM | POA: Diagnosis not present

## 2023-12-06 LAB — POCT URINALYSIS DIPSTICK
Bilirubin, UA: NEGATIVE
Glucose, UA: NEGATIVE
Ketones, UA: NEGATIVE
Leukocytes, UA: NEGATIVE
Nitrite, UA: NEGATIVE
Protein, UA: NEGATIVE
Spec Grav, UA: 1.025 (ref 1.010–1.025)
Urobilinogen, UA: 0.2 U/dL
pH, UA: 5.5 (ref 5.0–8.0)

## 2023-12-06 NOTE — Patient Instructions (Signed)
 Hypertension, Adult Hypertension is another name for high blood pressure. High blood pressure forces your heart to work harder to pump blood. This can cause problems over time. There are two numbers in a blood pressure reading. There is a top number (systolic) over a bottom number (diastolic). It is best to have a blood pressure that is below 120/80. What are the causes? The cause of this condition is not known. Some other conditions can lead to high blood pressure. What increases the risk? Some lifestyle factors can make you more likely to develop high blood pressure: Smoking. Not getting enough exercise or physical activity. Being overweight. Having too much fat, sugar, calories, or salt (sodium) in your diet. Drinking too much alcohol. Other risk factors include: Having any of these conditions: Heart disease. Diabetes. High cholesterol. Kidney disease. Obstructive sleep apnea. Having a family history of high blood pressure and high cholesterol. Age. The risk increases with age. Stress. What are the signs or symptoms? High blood pressure may not cause symptoms. Very high blood pressure (hypertensive crisis) may cause: Headache. Fast or uneven heartbeats (palpitations). Shortness of breath. Nosebleed. Vomiting or feeling like you may vomit (nauseous). Changes in how you see. Very bad chest pain. Feeling dizzy. Seizures. How is this treated? This condition is treated by making healthy lifestyle changes, such as: Eating healthy foods. Exercising more. Drinking less alcohol. Your doctor may prescribe medicine if lifestyle changes do not help enough and if: Your top number is above 130. Your bottom number is above 80. Your personal target blood pressure may vary. Follow these instructions at home: Eating and drinking  If told, follow the DASH eating plan. To follow this plan: Fill one half of your plate at each meal with fruits and vegetables. Fill one fourth of your plate  at each meal with whole grains. Whole grains include whole-wheat pasta, brown rice, and whole-grain bread. Eat or drink low-fat dairy products, such as skim milk or low-fat yogurt. Fill one fourth of your plate at each meal with low-fat (lean) proteins. Low-fat proteins include fish, chicken without skin, eggs, beans, and tofu. Avoid fatty meat, cured and processed meat, or chicken with skin. Avoid pre-made or processed food. Limit the amount of salt in your diet to less than 1,500 mg each day. Do not drink alcohol if: Your doctor tells you not to drink. You are pregnant, may be pregnant, or are planning to become pregnant. If you drink alcohol: Limit how much you have to: 0-1 drink a day for women. 0-2 drinks a day for men. Know how much alcohol is in your drink. In the [place] one drink equals one 12 oz bottle of beer (355 mL), one 5 oz glass of wine (148 mL), or one 1 oz glass of hard liquor (44 mL). Lifestyle  Work with your doctor to stay at a healthy weight or to lose weight. Ask your doctor what the best weight is for you. Get at least 30 minutes of exercise that causes your heart to beat faster (aerobic exercise) most days of the week. This may include walking, swimming, or biking. Get at least 30 minutes of exercise that strengthens your muscles (resistance exercise) at least 3 days a week. This may include lifting weights or doing Pilates. Do not smoke or use any products that contain nicotine or tobacco. If you need help quitting, ask your doctor. Check your blood pressure at home as told by your doctor. Keep all follow-up visits. Medicines Take over-the-counter and prescription medicines  only as told by your doctor. Follow directions carefully. Do not skip doses of blood pressure medicine. The medicine does not work as well if you skip doses. Skipping doses also puts you at risk for problems. Ask your doctor about side effects or reactions to medicines that you should watch  for. Contact a doctor if: You think you are having a reaction to the medicine you are taking. You have headaches that keep coming back. You feel dizzy. You have swelling in your ankles. You have trouble with your vision. Get help right away if: You get a very bad headache. You start to feel mixed up (confused). You feel weak or numb. You feel faint. You have very bad pain in your: Chest. Belly (abdomen). You vomit more than once. You have trouble breathing. These symptoms may be an emergency. Get help right away. Call 911. Do not wait to see if the symptoms will go away. Do not drive yourself to the hospital. Summary Hypertension is another name for high blood pressure. High blood pressure forces your heart to work harder to pump blood. For most people, a normal blood pressure is less than 120/80. Making healthy choices can help lower blood pressure. If your blood pressure does not get lower with healthy choices, you may need to take medicine. This information is not intended to replace advice given to you by your health care provider. Make sure you discuss any questions you have with your health care provider. Document Revised: 06/10/2021 Document Reviewed: 06/10/2021 Elsevier Patient Education  2024 [employer].

## 2023-12-06 NOTE — Progress Notes (Unsigned)
 I,Victoria T Deloria Lair, CMA,acting as a Neurosurgeon for Gwynneth Aliment, MD.,have documented all relevant documentation on the behalf of Gwynneth Aliment, MD,as directed by  Gwynneth Aliment, MD while in the presence of Gwynneth Aliment, MD.  Subjective:  Patient ID: [name] , female    DOB: 1955-09-30 , 68 y.o.   MRN: [id number]  Chief Complaint  Patient presents with   Hypertension   Hypothyroidism    HPI Discussed the use of AI scribe software for clinical note transcription with the patient, who gave verbal consent to proceed.  History of Present Illness Wendy S Longman "P. [name]" is a 68 year old female with hypertension and thyroid issues who presents for a blood pressure check and thyroid evaluation.  She manages her hypertension with medication, maintaining a blood pressure of 118/80 mmHg. She adheres to her medication regimen and engages in regular physical activity, she goes to the gym at least twice weekly.  Her diet is generally healthy, with salads and occasional rolls. She quit smoking on November 27, 2009, after experiencing strokes.  Her thyroid medication regimen includes 75 mcg Monday through Friday and 50 mcg on weekends. Despite this, she experiences decreased energy levels and difficulty sleeping, even with sleeping pills. She also takes Lexapro for mood management.  She has macular degeneration, receiving injections in her left eye for wet macular degeneration and anticipates treatment for dry macular degeneration in her right eye. She notes two dots near her pupil in the left eye, which could lead to blindness if they touch the pupil.  She experiences dysphagia, particularly with solid foods and occasionally with liquids when lying down, persisting for six months to a year. There is a family history of similar issues, as her mother had swallowing difficulties.  She reports urinary incontinence, for which she has attended therapy and performs exercises at home. Despite  these efforts, she continues to wear pads due to frequent urination. No current infection is reported despite frequent urination.  She is a former smoker, having quit in 2011 after a stroke. She is due for a CT scan of the chest in December, which will be the last one covered under current guidelines due to the 15-year post-smoking cessation rule. She has had recent bone density and mammogram screenings.   She presents today for BP check.  She reports compliance with meds.  She reports compliance with medication daily.  She denies having any dizziness, palpitations, and chest pain.   Hypertension This is a chronic problem. The current episode started more than 1 year ago. The problem has been gradually improving since onset. The problem is controlled. Pertinent negatives include no blurred vision, chest pain, palpitations or shortness of breath. Risk factors for coronary artery disease include post-menopausal state and sedentary lifestyle. The current treatment provides moderate improvement. Compliance problems include exercise.      Past Medical History:  Diagnosis Date   Abnormal gait    Amnesia    Anemia    hx   Aneurysm (HCC)    Anxiety    Asthma    Atrophic vaginitis    Benign hypertensive heart disease    Cardiomegaly    Chest pain    COPD (chronic obstructive pulmonary disease) (HCC)     - PFTs  01/24/06 FEV1 69% ratio 68% diffusing capacity 57% with no improvement after B2   - PFT's 10/1/ 09 FEV1 77   ratio  76    dlco  45            no resp to B2    - Nl alpha one antitripsin level 10/09   Cough    Depression    Difficulty speaking    Disorder of nasal cavity    Dyspnea    Enthesopathy of elbow region    GERD (gastroesophageal reflux disease)    Hammer toe    Headache(784.0)    Hemiparesis affecting dominant side as late effect of cerebrovascular accident [facility])    History of kidney stones    Hyperlipidemia    Hypertension    Hypothyroidism    Increased  urinary frequency    Low compliance bladder    Macular degeneration of both eyes    Malaise and fatigue    Nonruptured cerebral aneurysm    Obesity    Pneumonia    hx   Pure hypercholesterolemia    Recurrent major depressive episodes (HCC)    Seizure disorder, secondary (HCC)    2-3 focal siezures monthly   Seizures (HCC)    Short-term memory loss    since stroke   Skin sensation disturbance    Stroke (HCC) 2011   rt sided weakness   Swelling of limb    TMJ (temporomandibular joint disorder)    Urinary tract infectious disease    Vertigo as late effect of stroke    Vitamin D deficiency      Family History  Problem Relation Age of Onset   Heart disease Mother    Heart disease Father    Heart attack Father    Prostate cancer Father        prostate to lymph node   Breast cancer Maternal Aunt    Breast cancer Cousin 45   Breast cancer Cousin 45   Lung cancer Other      Current Outpatient Medications:    albuterol (PROVENTIL) (2.5 MG/3ML) 0.083% nebulizer solution, Take 3 mLs (2.5 mg total) by nebulization every 6 (six) hours., Disp: 1080 mL, Rfl: 1   albuterol (VENTOLIN HFA) 108 (90 Base) MCG/ACT inhaler, Inhale 2 puffs into the lungs every 6 (six) hours as needed for wheezing or shortness of breath., Disp: 24 g, Rfl: 3   aspirin EC 81 MG tablet, Take 81 mg by mouth daily., Disp: , Rfl:    Biotin 1 MG CAPS, Take 1 tablet by mouth 2 (two) times daily. Viviscal, Disp: , Rfl:    Calcium-Magnesium-Zinc (CAL-MAG-ZINC PO), Take 1 tablet by mouth daily., Disp: , Rfl:    Cholecalciferol (VITAMIN D-3) 1000 units CAPS, Take 2,000 Units by mouth at bedtime., Disp: , Rfl:    clopidogrel (PLAVIX) 75 MG tablet, TAKE 1 TABLET DAILY, Disp: 90 tablet, Rfl: 3   diclofenac Sodium (VOLTAREN) 1 % GEL, Voltaren 1 % topical gel  APPLY 2 GRAM TO THE AFFECTED AREA(S) BY TOPICAL ROUTE 4 TIMES PER DAY, Disp: 350 g, Rfl: 2   escitalopram (LEXAPRO) 10 MG tablet, Take 1 tablet (10 mg total) by mouth at  bedtime., Disp: 90 tablet, Rfl: 1   fluticasone (FLONASE) 50 MCG/ACT nasal spray, Place 2 sprays into both nostrils daily as needed (allergies or post-nasl drip)., Disp: , Rfl: 3   levETIRAcetam (KEPPRA) 500 MG tablet, Take 1 tablet (500 mg total) by mouth every 12 (twelve) hours., Disp: 180 tablet, Rfl: 3   levothyroxine (SYNTHROID) 50 MCG tablet, TAKE 1 TABLET ON SATURDAY AND SUNDAY, Disp: 26 tablet, Rfl: 3   levothyroxine (SYNTHROID) 75 MCG tablet, TAKE 1  TABLET IN THE MORNING BEFORE BREAKFAST MONDAY THROUGH FRIDAY (TAKE 50 MCG ON OTHER DAYS), Disp: 51 tablet, Rfl: 4   Multiple Vitamins-Minerals (PRESERVISION/LUTEIN PO), Take 1 tablet by mouth daily., Disp: , Rfl:    Omega-3 Fatty Acids (FISH OIL) 1000 MG CAPS, Take 2,000 mg by mouth 2 (two) times daily. , Disp: , Rfl:    Polyethyl Glycol-Propyl Glycol (SYSTANE) 0.4-0.3 % SOLN, Place 1 drop into both eyes daily as needed (dry eyes)., Disp: , Rfl:    Psyllium (FIBER) 28.3 % POWD, Take 10 mLs by mouth 2 (two) times daily., Disp: , Rfl:    rosuvastatin (CRESTOR) 20 MG tablet, TAKE 1 TABLET DAILY (REPLACES 10 MG DOSE), Disp: 90 tablet, Rfl: 3   telmisartan (MICARDIS) 20 MG tablet, Take 1 tablet (20 mg total) by mouth daily., Disp: 90 tablet, Rfl: 3   tiZANidine (ZANAFLEX) 4 MG tablet, TAKE 1 TABLET EVERY EVENING AS NEEDED, Disp: 60 tablet, Rfl: 5   traZODone (DESYREL) 50 MG tablet, Take 1.5 tablets (75 mg total) by mouth at bedtime as needed for sleep., Disp: 135 tablet, Rfl: 0   umeclidinium-vilanterol (ANORO ELLIPTA) 62.5-25 MCG/ACT AEPB, Inhale 1 puff into the lungs daily., Disp: 30 each, Rfl: 5   vitamin C (ASCORBIC ACID) 500 MG tablet, Take 1,000 mg by mouth at bedtime. Rose hips, Disp: , Rfl:    clonazePAM (KLONOPIN) 0.5 MG tablet, Take 0.5 tablets (0.25 mg total) by mouth daily as needed for anxiety., Disp: 15 tablet, Rfl: 1   loratadine (CLARITIN) 10 MG tablet, Take 1 tablet (10 mg total) by mouth daily., Disp: 30 tablet, Rfl: 2    NONFORMULARY OR COMPOUNDED ITEM, Apply 240 mg topically daily as needed. (Patient not taking: Reported on 12/06/2023), Disp: 1 each, Rfl: 3   Allergies  Allergen Reactions   Phenobarbital Hives, Shortness Of Breath and Swelling   Phenytoin Anaphylaxis, Hives and Swelling     Review of Systems  Constitutional: Negative.   Eyes:  Negative for blurred vision.  Respiratory: Negative.  Negative for shortness of breath.   Cardiovascular: Negative.  Negative for chest pain and palpitations.  Neurological: Negative.   Psychiatric/Behavioral: Negative.       Today's Vitals   12/06/23 1440  BP: 118/80  Pulse: 86  Temp: 98.5 F (36.9 C)  SpO2: 98%  Weight: 154 lb 3.2 oz (69.9 kg)  Height: 5' (1.524 m)   Body mass index is 30.12 kg/m.  Wt Readings from Last 3 Encounters:  12/06/23 154 lb 3.2 oz (69.9 kg)  11/30/23 154 lb 6.4 oz (70 kg)  10/11/23 153 lb 12.8 oz (69.8 kg)     Objective:  Physical Exam Vitals and nursing note reviewed.  Constitutional:      Appearance: Normal appearance.  HENT:     Head: Normocephalic and atraumatic.  Cardiovascular:     Rate and Rhythm: Normal rate and regular rhythm.     Heart sounds: Normal heart sounds.  Pulmonary:     Effort: Pulmonary effort is normal.     Breath sounds: Normal breath sounds.  Musculoskeletal:     Comments: Ambulatory with four prong cane Abnormal gait  Skin:    General: Skin is warm.  Neurological:     General: No focal deficit present.     Mental Status: She is alert.  Psychiatric:        Mood and Affect: Mood normal.        Behavior: Behavior normal.  Assessment And Plan:  Hypertensive heart disease without heart failure Assessment & Plan: Chronic. Blood pressure at 118/80 mmHg, with diastolic slightly above target. Compliant with medications and engages in regular exercise. - Continue current antihypertensive regimen, telmisartan 20mg  daily.  - Encourage continued regular exercise and healthy  diet. - Follow low sodium diet. F/u in six months.    Orders: -     CBC -     CMP14+EGFR  Atherosclerosis of aorta (HCC) Assessment & Plan: Incidental finding on CT A/P performed 11/06/2018--[facility]. LDL goal < 70.  She will c/w rosuvastatin 20mg  and ASA 81mg  daily.   Orders: -     CMP14+EGFR  Acquired hypothyroidism Assessment & Plan: On thyroid hormone replacement: 75 mcg Monday-Friday, 50 mcg on weekends. Thyroid function tests pending. - Order thyroid function tests.  Orders: -     TSH + free T4  Pharyngeal dysphagia Assessment & Plan: Intermittent difficulty swallowing solids and occasionally liquids, for 6 months to a year. Family history of similar issues requiring intervention. - Consult radiology for appropriate swallowing study. - Order swallowing study post-consultation.  Orders: -     DG ESOPHAGUS W SINGLE CM (SOL OR THIN BA); Future  Spastic hemiparesis (HCC) Assessment & Plan: Chronic, result of previous CVA. She has had physical therapy and ambulatory with cane. She uses muscle relaxers as needed.    Exudative age-related macular degeneration of right eye with active choroidal neovascularization (HCC) Assessment & Plan: Wet macular degeneration in the left eye, receiving injections. Concern for progression in the right eye, currently dry. - Continue regular ophthalmology follow-ups. - Proceed with planned left eye injections. - Monitor right eye for changes.   Other abnormal glucose Assessment & Plan: Previous labs reviewed, her A1c has been elevated in the past. I will check an A1c today. Reminded to avoid refined sugars including sugary drinks/foods and processed meats including bacon, sausages and deli meats.    Orders: -     CMP14+EGFR -     Hemoglobin A1c  Urge incontinence of urine Assessment & Plan: Urinary Incontinence Experiences urinary incontinence, uses pads. Underwent therapy and performs pelvic floor exercises. - Perform  urinalysis to rule out infection. - Encourage continued pelvic floor exercises.  Orders: -     POCT urinalysis dipstick  Moderate episode of recurrent major depressive disorder [facility]) Assessment & Plan: Chronic, she is now followed by Psychiatry. Sx stable on medication. She will continue with escitalopram 10mg  daily and clonazepam prn.    History of tobacco use Assessment & Plan: Former smoker, quit in 2011 post-stroke. Due for final CT chest scan in December per 15-year post-cessation guideline. - Schedule CT chest scan after August 15, 2024. -Congratulated on her continued abstinence from tobacco use.   Orders: -     CT CHEST LUNG CANCER SCREENING LOW DOSE WO CONTRAST; Future  Drug therapy -     Vitamin B12  Family history of cancer -     Ambulatory referral to Genetics   Return for 6 MONTH BPC.  Patient was given opportunity to ask questions. Patient verbalized understanding of the plan and was able to repeat key elements of the plan. All questions were answered to their satisfaction.    I, Gwynneth Aliment, MD, have reviewed all documentation for this visit. The documentation on 12/06/23 for the exam, diagnosis, procedures, and orders are all accurate and complete.   IF YOU HAVE BEEN REFERRED TO A SPECIALIST, IT MAY TAKE 1-2 WEEKS TO SCHEDULE/PROCESS THE REFERRAL.  IF YOU HAVE NOT HEARD FROM US/SPECIALIST IN TWO WEEKS, PLEASE GIVE [place] A CALL AT [phone].   THE PATIENT IS ENCOURAGED TO PRACTICE SOCIAL DISTANCING DUE TO THE COVID-19 PANDEMIC.

## 2023-12-07 LAB — CMP14+EGFR
ALT: 25 IU/L (ref 0–32)
AST: 23 IU/L (ref 0–40)
Albumin: 4.2 g/dL (ref 3.9–4.9)
Alkaline Phosphatase: 110 IU/L (ref 44–121)
BUN/Creatinine Ratio: 37 — ABNORMAL HIGH (ref 12–28)
BUN: 24 mg/dL (ref 8–27)
Bilirubin Total: 0.6 mg/dL (ref 0.0–1.2)
CO2: 26 mmol/L (ref 20–29)
Calcium: 9.5 mg/dL (ref 8.7–10.3)
Chloride: 103 mmol/L (ref 96–106)
Creatinine, Ser: 0.65 mg/dL (ref 0.57–1.00)
Globulin, Total: 2.2 g/dL (ref 1.5–4.5)
Glucose: 80 mg/dL (ref 70–99)
Potassium: 4.1 mmol/L (ref 3.5–5.2)
Sodium: 142 mmol/L (ref 134–144)
Total Protein: 6.4 g/dL (ref 6.0–8.5)
eGFR: 96 mL/min/{1.73_m2} (ref >59)

## 2023-12-07 LAB — CBC
Hematocrit: 41.4 % (ref 34.0–46.6)
Hemoglobin: 13.5 g/dL (ref 11.1–15.9)
MCH: 30.5 pg (ref 26.6–33.0)
MCHC: 32.6 g/dL (ref 31.5–35.7)
MCV: 94 fL (ref 79–97)
Platelets: 191 10*3/uL (ref 150–450)
RBC: 4.43 x10E6/uL (ref 3.77–5.28)
RDW: 13.1 % (ref 11.7–15.4)
WBC: 8.4 10*3/uL (ref 3.4–10.8)

## 2023-12-07 LAB — TSH+FREE T4
Free T4: 1.13 ng/dL (ref 0.82–1.77)
TSH: 0.488 u[IU]/mL (ref 0.450–4.500)

## 2023-12-07 LAB — VITAMIN B12: Vitamin B-12: >2000 pg/mL — ABNORMAL HIGH (ref 232–1245)

## 2023-12-07 LAB — HEMOGLOBIN A1C
Est. average glucose Bld gHb Est-mCnc: 114 mg/dL
Hgb A1c MFr Bld: 5.6 % (ref 4.8–5.6)

## 2023-12-07 NOTE — Assessment & Plan Note (Signed)
 Chronic. Blood pressure at 118/80 mmHg, with diastolic slightly above target. Compliant with medications and engages in regular exercise. - Continue current antihypertensive regimen, telmisartan 20mg  daily.  - Encourage continued regular exercise and healthy diet. - Follow low sodium diet. F/u in six months.

## 2023-12-07 NOTE — Assessment & Plan Note (Signed)
 Wet macular degeneration in the left eye, receiving injections. Concern for progression in the right eye, currently dry. - Continue regular ophthalmology follow-ups. - Proceed with planned left eye injections. - Monitor right eye for changes.

## 2023-12-07 NOTE — Assessment & Plan Note (Signed)
 Intermittent difficulty swallowing solids and occasionally liquids, for 6 months to a year. Family history of similar issues requiring intervention. - Consult radiology for appropriate swallowing study. - Order swallowing study post-consultation.

## 2023-12-07 NOTE — Assessment & Plan Note (Signed)
 Urinary Incontinence Experiences urinary incontinence, uses pads. Underwent therapy and performs pelvic floor exercises. - Perform urinalysis to rule out infection. - Encourage continued pelvic floor exercises.

## 2023-12-07 NOTE — Assessment & Plan Note (Signed)
 Incidental finding on CT A/P performed 11/06/2018--[facility]. LDL goal < 70.  She will c/w rosuvastatin 20mg  and ASA 81mg  daily.

## 2023-12-07 NOTE — Assessment & Plan Note (Addendum)
 Former smoker, quit in 2011 post-stroke. Due for final CT chest scan in December per 15-year post-cessation guideline. - Schedule CT chest scan after August 15, 2024. -Congratulated on her continued abstinence from tobacco use.

## 2023-12-07 NOTE — Assessment & Plan Note (Signed)
 On thyroid hormone replacement: 75 mcg Monday-Friday, 50 mcg on weekends. Thyroid function tests pending. - Order thyroid function tests.

## 2023-12-07 NOTE — Assessment & Plan Note (Signed)
Chronic, result of previous CVA. She has had physical therapy and ambulatory with cane. She uses muscle relaxers as needed.

## 2023-12-07 NOTE — Assessment & Plan Note (Signed)
 Chronic, she is now followed by Psychiatry. Sx stable on medication. She will continue with escitalopram 10mg  daily and clonazepam prn.

## 2023-12-07 NOTE — Assessment & Plan Note (Signed)
 Previous labs reviewed, her A1c has been elevated in the past. I will check an A1c today. Reminded to avoid refined sugars including sugary drinks/foods and processed meats including bacon, sausages and deli meats.

## 2023-12-09 ENCOUNTER — Encounter: Payer: Self-pay | Admitting: Internal Medicine

## 2023-12-11 ENCOUNTER — Telehealth: Payer: Self-pay

## 2023-12-11 NOTE — Telephone Encounter (Signed)
 Spoke w/Pt regarding order for scooter and the company [employer] which she had requested info be sent. Pt verified number sent in [facility] message. Spoke w/Tina at [facility] regarding further info needed to fill script. Inetta Fermo stated she thought the order had been filled but will check on it and if further info needed she will reach out to [facility].

## 2023-12-20 ENCOUNTER — Encounter: Payer: Self-pay | Admitting: Adult Health

## 2023-12-21 ENCOUNTER — Encounter: Payer: Self-pay | Admitting: *Deleted

## 2023-12-25 ENCOUNTER — Encounter: Payer: Self-pay | Admitting: Internal Medicine

## 2023-12-25 DIAGNOSIS — H43821 Vitreomacular adhesion, right eye: Secondary | ICD-10-CM | POA: Diagnosis not present

## 2023-12-25 DIAGNOSIS — H353211 Exudative age-related macular degeneration, right eye, with active choroidal neovascularization: Secondary | ICD-10-CM | POA: Diagnosis not present

## 2023-12-25 DIAGNOSIS — H2513 Age-related nuclear cataract, bilateral: Secondary | ICD-10-CM | POA: Diagnosis not present

## 2023-12-25 DIAGNOSIS — H353123 Nonexudative age-related macular degeneration, left eye, advanced atrophic without subfoveal involvement: Secondary | ICD-10-CM | POA: Diagnosis not present

## 2023-12-25 DIAGNOSIS — H43812 Vitreous degeneration, left eye: Secondary | ICD-10-CM | POA: Diagnosis not present

## 2023-12-25 DIAGNOSIS — H35033 Hypertensive retinopathy, bilateral: Secondary | ICD-10-CM | POA: Diagnosis not present

## 2023-12-28 DIAGNOSIS — G4730 Sleep apnea, unspecified: Secondary | ICD-10-CM | POA: Diagnosis not present

## 2023-12-28 DIAGNOSIS — R0902 Hypoxemia: Secondary | ICD-10-CM | POA: Diagnosis not present

## 2024-01-02 ENCOUNTER — Other Ambulatory Visit

## 2024-01-04 ENCOUNTER — Telehealth: Payer: Self-pay | Admitting: Pulmonary Disease

## 2024-01-04 ENCOUNTER — Encounter: Payer: Self-pay | Admitting: Pulmonary Disease

## 2024-01-04 NOTE — Telephone Encounter (Signed)
 Your overnight oxygen  saturation testing shows you need to continue using oxygen  at night Your oxygen  was below 88% for over 3 hours over 73% of the night

## 2024-01-08 DIAGNOSIS — H353123 Nonexudative age-related macular degeneration, left eye, advanced atrophic without subfoveal involvement: Secondary | ICD-10-CM | POA: Diagnosis not present

## 2024-01-11 ENCOUNTER — Other Ambulatory Visit: Payer: Self-pay | Admitting: Internal Medicine

## 2024-01-11 DIAGNOSIS — E039 Hypothyroidism, unspecified: Secondary | ICD-10-CM

## 2024-01-11 DIAGNOSIS — Z8673 Personal history of transient ischemic attack (TIA), and cerebral infarction without residual deficits: Secondary | ICD-10-CM

## 2024-01-11 DIAGNOSIS — E785 Hyperlipidemia, unspecified: Secondary | ICD-10-CM

## 2024-01-11 DIAGNOSIS — I2510 Atherosclerotic heart disease of native coronary artery without angina pectoris: Secondary | ICD-10-CM

## 2024-01-11 DIAGNOSIS — I700 Atherosclerosis of aorta: Secondary | ICD-10-CM

## 2024-01-15 ENCOUNTER — Ambulatory Visit (INDEPENDENT_AMBULATORY_CARE_PROVIDER_SITE_OTHER): Admitting: Podiatry

## 2024-01-15 ENCOUNTER — Ambulatory Visit (INDEPENDENT_AMBULATORY_CARE_PROVIDER_SITE_OTHER)

## 2024-01-15 ENCOUNTER — Telehealth: Payer: Self-pay | Admitting: Podiatry

## 2024-01-15 ENCOUNTER — Encounter: Payer: Self-pay | Admitting: Podiatry

## 2024-01-15 VITALS — Ht 60.0 in | Wt 154.0 lb

## 2024-01-15 DIAGNOSIS — M779 Enthesopathy, unspecified: Secondary | ICD-10-CM

## 2024-01-15 DIAGNOSIS — M21371 Foot drop, right foot: Secondary | ICD-10-CM | POA: Diagnosis not present

## 2024-01-15 NOTE — Telephone Encounter (Signed)
 Patient stated [facility] is requesting notes from today's visit with patient. [facility] telephone number, [phone]/ Patient wasn't sure of fax number.

## 2024-01-17 NOTE — Progress Notes (Signed)
 Chief Complaint  Patient presents with   Foot Pain    Right ankle/ foot pain. Burning on bottom, neuropathy. Had 2 strokes. 8 pain. Wears ankle brace. Non diabetic.    HPI: 68 y.o. female presenting today for evaluation of foot drop to the right lower extremity.  History of stroke.  In the past she has worn an AFO which seem to alleviate her symptoms.  Over the last 2 years she discontinued the AFO because she thought she was healed but unfortunately the foot drop and cavus deformity of the foot returned.  Past Medical History:  Diagnosis Date   Abnormal gait    Amnesia    Anemia    hx   Aneurysm (HCC)    Anxiety    Asthma    Atrophic vaginitis    Benign hypertensive heart disease    Cardiomegaly    Chest pain    COPD (chronic obstructive pulmonary disease) (HCC)     - PFTs  01/24/06 FEV1 69% ratio 68% diffusing capacity 57% with no improvement after B2   - PFT's 10/1/ 09 FEV1 77   ratio  76    dlco                   45            no resp to B2    - Nl alpha one antitripsin level 10/09   Cough    Depression    Difficulty speaking    Disorder of nasal cavity    Dyspnea    Enthesopathy of elbow region    GERD (gastroesophageal reflux disease)    Hammer toe    Headache(784.0)    Hemiparesis affecting dominant side as late effect of cerebrovascular accident (HCC)    History of kidney stones    Hyperlipidemia    Hypertension    Hypothyroidism    Increased urinary frequency    Low compliance bladder    Macular degeneration of both eyes    Malaise and fatigue    Nonruptured cerebral aneurysm    Obesity    Pneumonia    hx   Pure hypercholesterolemia    Recurrent major depressive episodes (HCC)    Seizure disorder, secondary (HCC)    2-3 focal siezures monthly   Seizures (HCC)    Short-term memory loss    since stroke   Skin sensation disturbance    Stroke [facility]) 2011   rt sided weakness   Swelling of limb    TMJ (temporomandibular joint disorder)    Urinary tract  infectious disease    Vertigo as late effect of stroke    Vitamin D  deficiency     Past Surgical History:  Procedure Laterality Date   ABDOMINAL HYSTERECTOMY     ANTERIOR CERVICAL DECOMP/DISCECTOMY FUSION N/A 09/09/2021   Procedure: Anterior  Cervical Decompression Fusion  Cervical four-five, Cervical five-six;  Surgeon: Pincus Bridgeman, DO;  Location: [facility] OR;  Service: Neurosurgery;  Laterality: N/A;   BACK SURGERY     CARPAL TUNNEL RELEASE Left 09/09/2021   Procedure: LEFT CARPAL TUNNEL RELEASE;  Surgeon: Pincus Bridgeman, DO;  Location: [facility] OR;  Service: Neurosurgery;  Laterality: Left;   CEREBRAL ANGIOGRAM     x3   COLONOSCOPY WITH PROPOFOL  N/A 08/18/2016   Procedure: COLONOSCOPY WITH PROPOFOL ;  Surgeon: Alvis Jourdain, MD;  Location: [facility] ENDOSCOPY;  Service: Endoscopy;  Laterality: N/A;   LAPAROSCOPIC HYSTERECTOMY     MANDIBLE FRACTURE SURGERY  RADIOLOGY WITH ANESTHESIA N/A 08/21/2013   Procedure: RADIOLOGY WITH ANESTHESIA;  Surgeon: Bronson Canny, MD;  Location: [facility] OR;  Service: Radiology;  Laterality: N/A;   stones     no surgery    Allergies  Allergen Reactions   Phenobarbital Hives, Shortness Of Breath and Swelling   Phenytoin  Anaphylaxis, Hives and Swelling     Physical Exam: General: The patient is alert and oriented x3 in no acute distress.  Dermatology: Skin is warm, dry and supple bilateral lower extremities.   Vascular: Palpable pedal pulses bilaterally. Capillary refill within normal limits.  No appreciable edema.  No erythema.  Neurological: Grossly intact via light touch  Musculoskeletal Exam: Reducible cavovarus deformity of the foot noted with loss of peroneal muscle strength.  Inability to dorsiflex and evert the foot consistent with a foot drop deformity secondary to stroke  Radiographic Exam RT foot 01/17/2024:  Cavovarus deformity noted.  No acute fracture.  Joint spaces preserved  Assessment/Plan of Care: 1.  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  X-rays reviewed -The patient has discontinued wearing her AFO.  Stressed the importance of wearing the AFO daily to maintain appropriate foot position with ambulation -Order placed for new AFO to [facility] -Also discussed surgical versus conservative care.  Surgically the patient would require tendon transfer to help alleviate the peroneal muscle atrophy and imbalance of the tendon structures of the foot.  For now we will continue to pursue conservative care -Return to clinic as needed     Dot Gazella, DPM [facility]  Dr. Dot Gazella, DPM    [address] [place], [place] [address]                Office [phone]  Fax [phone]

## 2024-01-22 ENCOUNTER — Other Ambulatory Visit

## 2024-01-22 ENCOUNTER — Encounter: Admitting: Genetic Counselor

## 2024-01-30 ENCOUNTER — Encounter (HOSPITAL_BASED_OUTPATIENT_CLINIC_OR_DEPARTMENT_OTHER): Payer: Self-pay | Admitting: Cardiology

## 2024-01-30 ENCOUNTER — Ambulatory Visit (HOSPITAL_BASED_OUTPATIENT_CLINIC_OR_DEPARTMENT_OTHER): Payer: Medicare Other | Admitting: Cardiology

## 2024-01-30 VITALS — BP 120/64 | HR 91 | Ht 62.0 in | Wt 156.1 lb

## 2024-01-30 DIAGNOSIS — I700 Atherosclerosis of aorta: Secondary | ICD-10-CM

## 2024-01-30 DIAGNOSIS — E785 Hyperlipidemia, unspecified: Secondary | ICD-10-CM

## 2024-01-30 DIAGNOSIS — I2510 Atherosclerotic heart disease of native coronary artery without angina pectoris: Secondary | ICD-10-CM

## 2024-01-30 DIAGNOSIS — Z8673 Personal history of transient ischemic attack (TIA), and cerebral infarction without residual deficits: Secondary | ICD-10-CM | POA: Diagnosis not present

## 2024-01-30 DIAGNOSIS — I10 Essential (primary) hypertension: Secondary | ICD-10-CM | POA: Diagnosis not present

## 2024-01-30 DIAGNOSIS — Z7189 Other specified counseling: Secondary | ICD-10-CM | POA: Diagnosis not present

## 2024-01-30 NOTE — Patient Instructions (Signed)
 Medication Instructions:  Your physician recommends that you continue on your current medications as directed. Please refer to the Current Medication list given to you today.  Follow-Up: At [facility], you and your health needs are our priority.  As part of our continuing mission to provide you with exceptional heart care, our providers are all part of one team.  This team includes your primary Cardiologist (physician) and Advanced Practice Providers or APPs (Physician Assistants and Nurse Practitioners) who all work together to provide you with the care you need, when you need it.  Your next appointment:   1 year(s)  Provider:   Sheryle Donning, MD, Slater Duncan, NP, or Neomi Banks, NP

## 2024-01-30 NOTE — Progress Notes (Signed)
 Cardiology Office Note:  .   Date:  01/30/2024  ID:  [name], DOB February 09, 1956, MRN [id number] PCP: Cleave Curling, MD  [facility] Providers Cardiologist:  Sheryle Donning, MD {  History of Present Illness: .   [name] is a 68 y.o. female with a hx of brain aneurysm, aortic atherosclerosis, hypertension, hyperlipidemia, former tobacco use, COPD, prior ICH who is seen for follow up. I initially saw her 04/20/20 as a new consult at the request of Cleave Curling, MD for the evaluation and management of dyspnea on exertion.   Cardiac history: prior ICH/CVA. History of hypertension, hyperlipidemia, prior tobacco use.  Family history: father has stents, has had open heart surgery. Has cancer now. Mother has a heart murmur. No heart surgeries that she knows of. Has a brother, has sleep apnea. No known heart issues. Prior cardiac testing and/or incidental findings on other testing (ie coronary calcium ): echo 2015. CT noncon lung 04/19/19 showed aortic atherosclerosis and coronary calcification. On noncontrast CT, has prominent calcium  in distal left main/proximal LAD.  Today: Tripped and fell in the bathroom yesterday, didn't hit head, has bruises. Otherwise has been stable. Feels occasional sensation in her chest that feels like a constant small pain, not quite a burning. Always in the same place. Can be any time of day. Feels better after she belches. Has been happening intermittently over the last few weeks.   Just completed overnight pulse oximetry study, hoping to get off O2 at night but she dipped into the 80s, so will have to continue 2 lpm. Does not require during the day.  Walks around the house, goes to the gym, uses the treadmill and walks. Lifts weights.   Has occasional palpitations at home, brief sensation in her neck of a pounding. Had one PVC on ECG today.  Has chronic R sided swelling due to her prior CVA. Wears brace on the right.  ROS: Denies  other chest pain, shortness of breath at rest or with normal exertion. No PND, orthopnea, or unexpected weight gain. No syncope. ROS otherwise negative except as noted.   Studies Reviewed: Aaron Aas    EKG:  EKG Interpretation Date/Time:  Tuesday Jan 30 2024 15:46:58 EDT Ventricular Rate:  91 PR Interval:  154 QRS Duration:  70 QT Interval:  342 QTC Calculation: 420 R Axis:   84  Text Interpretation: Sinus rhythm with occasional Premature ventricular complexes Right atrial enlargement When compared with ECG of 24-Jul-2013 10:29, Premature ventricular complexes are now Present Confirmed by Sheryle Donning [phone]) on 01/30/2024 4:33:13 PM    Physical Exam:   VS:  BP 120/64 (BP Location: Left Arm, Patient Position: Sitting, Cuff Size: Normal)   Pulse 91   Ht 5\' 2"  (1.575 m)   Wt 156 lb 1.6 oz (70.8 kg)   SpO2 92%   BMI 28.55 kg/m    Wt Readings from Last 3 Encounters:  01/30/24 156 lb 1.6 oz (70.8 kg)  01/15/24 154 lb (69.9 kg)  12/06/23 154 lb 3.2 oz (69.9 kg)    GEN: Well nourished, well developed in no acute distress HEENT: Normal, moist mucous membranes NECK: No JVD CARDIAC: regular rhythm, normal S1 and S2, no rubs or gallops. No murmur. VASCULAR: Radial and DP pulses 2+ bilaterally. No carotid bruits RESPIRATORY:  Clear to auscultation without rales, wheezing or rhonchi  ABDOMEN: Soft, non-tender, non-distended MUSCULOSKELETAL:  Ambulates independently SKIN: Warm and dry, trace LE edema R>L, ankle brace in place NEUROLOGIC:  Alert and oriented x  3. No focal neuro deficits noted. PSYCHIATRIC:  Normal affect    ASSESSMENT AND PLAN: .    History of CVA, with history of aneurysms Hypertension -continue telmisartan  -asa, clopidogrel , statin as below   Nonobstructive CAD Coronary Calcium  Aortic atherosclerosis Hyperlipidemia -secondary prevention discussed -continue aspirin , clopidogrel  per neurology -continue rosuvastatin  20 mg daily -LDL goal <70, last 57  03/2023 -has had atypical chest pain. Reviewed red flag warning signs that need immediate medical attention  CV risk counseling and prevention -recommend heart healthy/Mediterranean diet, with whole grains, fruits, vegetable, fish, lean meats, nuts, and olive oil. Limit salt. -recommend moderate walking, 3-5 times/week for 30-50 minutes each session. Aim for at least 150 minutes.week. Goal should be pace of 3 miles/hours, or walking 1.5 miles in 30 minutes -recommend avoidance of tobacco products. Avoid excess alcohol.  Dispo: 1 year or sooner as needed  Signed, Sheryle Donning, MD   Sheryle Donning, MD, PhD, [facility]  [place] at [facility] at [facility] [address] [place], [place] [address] [phone]

## 2024-02-12 DIAGNOSIS — H43812 Vitreous degeneration, left eye: Secondary | ICD-10-CM | POA: Diagnosis not present

## 2024-02-12 DIAGNOSIS — H2513 Age-related nuclear cataract, bilateral: Secondary | ICD-10-CM | POA: Diagnosis not present

## 2024-02-12 DIAGNOSIS — H43821 Vitreomacular adhesion, right eye: Secondary | ICD-10-CM | POA: Diagnosis not present

## 2024-02-12 DIAGNOSIS — H35033 Hypertensive retinopathy, bilateral: Secondary | ICD-10-CM | POA: Diagnosis not present

## 2024-02-12 DIAGNOSIS — H353123 Nonexudative age-related macular degeneration, left eye, advanced atrophic without subfoveal involvement: Secondary | ICD-10-CM | POA: Diagnosis not present

## 2024-02-12 DIAGNOSIS — H353211 Exudative age-related macular degeneration, right eye, with active choroidal neovascularization: Secondary | ICD-10-CM | POA: Diagnosis not present

## 2024-02-29 ENCOUNTER — Inpatient Hospital Stay

## 2024-02-29 ENCOUNTER — Other Ambulatory Visit

## 2024-02-29 ENCOUNTER — Inpatient Hospital Stay: Attending: Nurse Practitioner | Admitting: Genetic Counselor

## 2024-02-29 ENCOUNTER — Encounter: Payer: Self-pay | Admitting: Genetic Counselor

## 2024-02-29 ENCOUNTER — Encounter: Admitting: Genetic Counselor

## 2024-02-29 DIAGNOSIS — Z1379 Encounter for other screening for genetic and chromosomal anomalies: Secondary | ICD-10-CM

## 2024-02-29 DIAGNOSIS — Z8042 Family history of malignant neoplasm of prostate: Secondary | ICD-10-CM

## 2024-02-29 DIAGNOSIS — Z803 Family history of malignant neoplasm of breast: Secondary | ICD-10-CM

## 2024-02-29 DIAGNOSIS — Z8041 Family history of malignant neoplasm of ovary: Secondary | ICD-10-CM

## 2024-02-29 DIAGNOSIS — Z800 Family history of malignant neoplasm of digestive organs: Secondary | ICD-10-CM | POA: Diagnosis not present

## 2024-02-29 LAB — GENETIC SCREENING ORDER

## 2024-02-29 NOTE — Progress Notes (Signed)
 REFERRING PROVIDER: Jarold Medici, MD [address] [place],  [place] [address]  PRIMARY PROVIDER:  Jarold Medici, MD  PRIMARY REASON FOR VISIT:  Encounter Diagnoses  Name Primary?   Family history of breast cancer Yes   Family history of ovarian cancer    Family history of prostate cancer    HISTORY OF PRESENT ILLNESS:   Ms. Kenedy, a 68 y.o. female, was seen for a [facility] cancer genetics consultation at the request of Dr. Jarold due to a family history of cancer.  Ms. Balicki presents to clinic today to discuss the possibility of a hereditary predisposition to cancer, to discuss genetic testing, and to further clarify her future cancer risks, as well as potential cancer risks for family members.   Ms. Kernen is a 68 y.o. female with no personal history of cancer.    RISK FACTORS:  Menarche was at age 54.  First live birth at age 58.  Ovaries intact: no.  Uterus intact: no.  Menopausal status: postmenopausal.  HRT use: 20 years. Colonoscopy: yes; normal colonoscopy in 2017. Mammogram within the last year: yes. Number of breast biopsies: 0. Any excessive radiation exposure in the past: no  Past Medical History:  Diagnosis Date   Abnormal gait    Amnesia    Anemia    hx   Aneurysm (HCC)    Anxiety    Asthma    Atrophic vaginitis    Benign hypertensive heart disease    Cardiomegaly    Chest pain    COPD (chronic obstructive pulmonary disease) (HCC)     - PFTs  01/24/06 FEV1 69% ratio 68% diffusing capacity 57% with no improvement after B2   - PFT's 10/1/ 09 FEV1 77   ratio  76    dlco                   45            no resp to B2    - Nl alpha one antitripsin level 10/09   Cough    Depression    Difficulty speaking    Disorder of nasal cavity    Dyspnea    Enthesopathy of elbow region    GERD (gastroesophageal reflux disease)    Hammer toe    Headache(784.0)    Hemiparesis affecting dominant side as late effect of cerebrovascular  accident (HCC)    History of kidney stones    Hyperlipidemia    Hypertension    Hypothyroidism    Increased urinary frequency    Low compliance bladder    Macular degeneration of both eyes    Malaise and fatigue    Nonruptured cerebral aneurysm    Obesity    Pneumonia    hx   Pure hypercholesterolemia    Recurrent major depressive episodes (HCC)    Seizure disorder, secondary (HCC)    2-3 focal siezures monthly   Seizures (HCC)    Short-term memory loss    since stroke   Skin sensation disturbance    Stroke [facility]) 2011   rt sided weakness   Swelling of limb    TMJ (temporomandibular joint disorder)    Urinary tract infectious disease    Vertigo as late effect of stroke    Vitamin D  deficiency     Past Surgical History:  Procedure Laterality Date   ABDOMINAL HYSTERECTOMY     ANTERIOR CERVICAL DECOMP/DISCECTOMY FUSION N/A 09/09/2021   Procedure: Anterior  Cervical Decompression Fusion  Cervical four-five, Cervical five-six;  Surgeon: Carollee Lani BROCKS, DO;  Location: [facility] OR;  Service: Neurosurgery;  Laterality: N/A;   BACK SURGERY     CARPAL TUNNEL RELEASE Left 09/09/2021   Procedure: LEFT CARPAL TUNNEL RELEASE;  Surgeon: Carollee Lani BROCKS, DO;  Location: [facility] OR;  Service: Neurosurgery;  Laterality: Left;   CEREBRAL ANGIOGRAM     x3   COLONOSCOPY WITH PROPOFOL  N/A 08/18/2016   Procedure: COLONOSCOPY WITH PROPOFOL ;  Surgeon: Belvie Just, MD;  Location: [facility] END[facility]COPY;  Service: Endoscopy;  Laterality: N/A;   LAPAROSCOPIC HYSTERECTOMY     MANDIBLE FRACTURE SURGERY     RADIOLOGY WITH ANESTHESIA N/A 08/21/2013   Procedure: RADIOLOGY WITH ANESTHESIA;  Surgeon: Thyra MARLA Nash, MD;  Location: [facility] OR;  Service: Radiology;  Laterality: N/A;   stones     no surgery    Social History   Socioeconomic History   Marital status: Married    Spouse name: Tanda   Number of children: 2   Years of education: 14   Highest education level: Associate degree: occupational, Scientist, product/process development, or  vocational program  Occupational History   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    Drug use: No   Sexual activity: Not Currently  Other Topics Concern   Not on file  Social History Narrative   Patient lives at home with her husband ETTER Tanda).   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    Patient is on disability.    Patient has an Associates degree.   Patient is right-handed.   Patient drinks 2 cups of green tea most days.               Social Drivers of Corporate investment banker Strain: Low Risk  (11/30/2023)   Overall Financial Resource Strain (CARDIA)    Difficulty of Paying Living Expenses: Not hard at all  Food Insecurity: No Food Insecurity (11/30/2023)   Hunger Vital Sign    Worried About Running Out of Food in the Last Year: Never true    Ran Out of Food in the Last Year: Never true  Transportation Needs: No Transportation Needs (11/30/2023)   PRAPARE - Administrator, Civil Service (Medical): No    Lack of Transportation (Non-Medical): No  Physical Activity: Sufficiently Active (11/30/2023)   Exercise Vital Sign    Days of Exercise per Week: 4 days    Minutes of Exercise per Session: 40 min  Stress: Stress Concern Present (11/30/2023)   [employer] of Occupational Health - Occupational Stress Questionnaire    Feeling of Stress : To some extent  Social Connections:  Moderately Integrated (11/30/2023)   Social Connection and Isolation Panel    Frequency of Communication with Friends and Family: More than three times a week  Frequency of Social Gatherings with Friends and Family: Twice a week    Attends Religious Services: More than 4 times per year    Active Member of [employer] or Organizations: No    Attends Banker Meetings: Never    Marital Status: Married  Recent Concern: Social Connections - Moderately Isolated (10/25/2023)   Social Connection and Isolation Panel    Frequency of Communication with Friends and Family: More than three times a week    Frequency of Social Gatherings with Friends and Family: Twice a week    Attends Religious Services: Never    Database administrator or Organizations: No    Attends Engineer, structural: Never    Marital Status: Married     FAMILY HISTORY:  We obtained a detailed, 4-generation family history.  Significant diagnoses are listed below: Family History  Problem Relation Age of Onset   Heart disease Mother    Squamous cell carcinoma Mother 52   Heart disease Father    Heart attack Father    Prostate cancer Father 57       met to bones   Melanoma Father 26   Lung cancer Brother 41   Lymphoma Brother 20   Testicular cancer Brother 20   Colon cancer Maternal Aunt 54   Prostate cancer Maternal Uncle 74   Throat cancer Paternal Aunt 70   Cancer Paternal Uncle        unknown type   Ovarian cancer Maternal Grandmother 72   Stomach cancer Maternal Grandfather 45   Breast cancer Cousin 55       maternal first cousin   Lung cancer Other    Colon cancer Maternal Great-grandmother    ]   Ms. Mcgruder is unaware of previous family history of genetic testing for hereditary cancer risks. There is no reported Ashkenazi Jewish ancestry.   GENETIC COUNSELING ASSESSMENT: Ms. Smiddy is a 68 y.o. female with a family history of cancer which is somewhat suggestive of a hereditary  predisposition to cancer. We, therefore, discussed and recommended the following at today's visit.   DISCUSSION: We discussed that 5 - 10% of cancer is hereditary, with most cases of hereditary breast cancer associated with BRCA1/2.  There are other genes that can be associated with hereditary breast, prostate, and ovarian cancer syndromes.  We discussed that testing is beneficial for several reasons, including knowing about other cancer risks, identifying potential screening and risk-reduction options that may be appropriate, and to understanding if other family members could be at risk for cancer and allowing them to undergo genetic testing.  We reviewed the characteristics, features and inheritance patterns of hereditary cancer syndromes. We also discussed genetic testing, including the appropriate family members to test, the process of testing, insurance coverage and turn-around-time for results. We discussed the implications of a negative, positive, carrier and/or variant of uncertain significant result. We discussed that negative results would be uninformative given that Ms. Hyle does not have a personal history of cancer. We recommended Ms. Montijo pursue genetic testing for a panel that contains genes associated with breast, prostate, colon, and ovarian cancer.  Ms. Lookingbill was offered a common hereditary cancer panel (40 genes) and an expanded pan-cancer panel (77 genes). Ms. Kleven was informed of the benefits and limitations of each panel, including that expanded pan-cancer panels contain several genes that do not have clear management guidelines at this point in time.  We also discussed that as the number of genes included on a  panel increases, the chances of variants of uncertain significance increases.  After considering the benefits and limitations of each gene panel, Ms. Solimine elected to have [facility] CancerNext-Expanded Panel.  The CancerNext-Expanded gene panel  offered by [facility] and includes sequencing, rearrangement, and RNA analysis for the following 77 genes: AIP, ALK, APC, ATM, AXIN2, BAP1, BARD1, BMPR1A, BRCA1, BRCA2, BRIP1, CDC73, CDH1, CDK4, CDKN1B, CDKN2A, CEBPA, CHEK2, CTNNA1, DDX41, DICER1, ETV6, FH, FLCN, GATA2, LZTR1, MAX, MBD4, MEN1, MET, MLH1, MSH2, MSH3, MSH6, MUTYH, NF1, NF2, NTHL1, PALB2, PHOX2B, PMS2, POT1, PRKAR1A, PTCH1, PTEN, RAD51C, RAD51D, RB1, RET, RPS20, RUNX1, SDHA, SDHAF2, SDHB, SDHC, SDHD, SMAD4, SMARCA4, SMARCB1, SMARCE1, STK11, SUFU, TMEM127, TP53, TSC1, TSC2, VHL, and WT1 (sequencing and deletion/duplication); EGFR, HOXB13, KIT, MITF, PDGFRA, POLD1, and POLE (sequencing only); EPCAM and GREM1 (deletion/duplication only).    Based on Ms. Jacuinde's family history of cancer, she meets medical criteria for genetic testing. Though Ms. Amaro is not personally affected, there are no affected family members that are willing/able to undergo hereditary cancer testing. Despite that she meets criteria, she may still have an out of pocket cost. We discussed that if her out of pocket cost for testing is over $100, the laboratory should contact them to discuss self-pay prices, patient pay assistance programs, if applicable, and other billing options.  We discussed that some people do not want to undergo genetic testing due to fear of genetic discrimination.  A federal law called the Genetic Information Non-Discrimination Act (GINA) of 2008 helps protect individuals against genetic discrimination based on their genetic test results.  It impacts both health insurance and employment.  With health insurance, it protects against increased premiums, being kicked off insurance or being forced to take a test in order to be insured.  For employment it protects against hiring, firing and promoting decisions based on genetic test results.  GINA does not apply to those in the [employer], those who work for companies with less than 15 employees,  and new life insurance or long-term disability insurance policies.  Health status due to a cancer diagnosis is not protected under GINA.  PLAN: After considering the risks, benefits, and limitations, Ms. Causey provided informed consent to pursue genetic testing and the blood sample was sent to [facility] for analysis of the CancerNext-Expanded Panel. Results should be available within approximately 2-3 weeks' time, at which point they will be disclosed by telephone to Ms. Honeycutt, as will any additional recommendations warranted by these results. Ms. Warrior will receive a summary of her genetic counseling visit and a copy of her results once available. This information will also be available in Epic.   Ms. Seybold questions were answered to her satisfaction today. Our contact information was provided should additional questions or concerns arise. Thank you for the referral and allowing us  to share in the care of your patient.   Mathew Storck, MS, [facility] Genetic Counselor [email or web address] (P) [phone]  60 minutes were spent on the date of the encounter in service to the patient including preparation, face-to-face consultation, documentation and care coordination. The patient was seen alone.  Drs. Gudena and/or Lanny were available to discuss this case as needed.  _______________________________________________________________________ For Office Staff:  Number of people involved in session: 1 Was an Intern/ student involved with case: no

## 2024-03-02 ENCOUNTER — Encounter (HOSPITAL_COMMUNITY): Payer: Self-pay | Admitting: Interventional Radiology

## 2024-03-07 ENCOUNTER — Other Ambulatory Visit (HOSPITAL_COMMUNITY): Payer: Self-pay | Admitting: Psychiatry

## 2024-03-07 DIAGNOSIS — F411 Generalized anxiety disorder: Secondary | ICD-10-CM

## 2024-03-07 DIAGNOSIS — F331 Major depressive disorder, recurrent, moderate: Secondary | ICD-10-CM

## 2024-03-11 DIAGNOSIS — H353123 Nonexudative age-related macular degeneration, left eye, advanced atrophic without subfoveal involvement: Secondary | ICD-10-CM | POA: Diagnosis not present

## 2024-03-11 DIAGNOSIS — H353211 Exudative age-related macular degeneration, right eye, with active choroidal neovascularization: Secondary | ICD-10-CM | POA: Diagnosis not present

## 2024-03-11 DIAGNOSIS — H25813 Combined forms of age-related cataract, bilateral: Secondary | ICD-10-CM | POA: Diagnosis not present

## 2024-03-11 DIAGNOSIS — Z79899 Other long term (current) drug therapy: Secondary | ICD-10-CM | POA: Diagnosis not present

## 2024-03-12 DIAGNOSIS — H353123 Nonexudative age-related macular degeneration, left eye, advanced atrophic without subfoveal involvement: Secondary | ICD-10-CM | POA: Diagnosis not present

## 2024-03-15 ENCOUNTER — Encounter: Payer: Self-pay | Admitting: Genetic Counselor

## 2024-03-15 ENCOUNTER — Telehealth: Payer: Self-pay | Admitting: Genetic Counselor

## 2024-03-15 DIAGNOSIS — Z1379 Encounter for other screening for genetic and chromosomal anomalies: Secondary | ICD-10-CM | POA: Insufficient documentation

## 2024-03-15 NOTE — Telephone Encounter (Signed)
 I contacted Ms. Soots to discuss her genetic testing results. No pathogenic variants were identified in the 77 genes analyzed. Of note, a variant of uncertain significance was identified in the DICER1 gene. Detailed clinic note to follow.  The test report has been scanned into EPIC and is located under the Molecular Pathology section of the Results Review tab.  A portion of the result report is included below for reference.   Cathyrn Deas, MS, [facility] Genetic Counselor [email or web address] (P) [phone]

## 2024-03-18 NOTE — Progress Notes (Unsigned)
 [facility] MD/PA/NP OP Progress Note  03/19/2024 11:12 AM [name]  MRN:  [id number]  Visit Diagnosis:    ICD-10-CM   1. GAD (generalized anxiety disorder)  F41.1 escitalopram  (LEXAPRO ) 10 MG tablet    traZODone  (DESYREL ) 50 MG tablet    clonazePAM  (KLONOPIN ) 0.5 MG tablet    2. Moderate episode of recurrent major depressive disorder (HCC)  F33.1 escitalopram  (LEXAPRO ) 10 MG tablet    traZODone  (DESYREL ) 50 MG tablet      Assessment: [name] is a 68 y.o. 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    During initial evaluation patient reported symptoms of depression including anhedonia, feelings of hopelessness, increased fatigue, decreased appetite, difficulty sleeping, difficulty concentrating, and feelings of worthlessness.  She denied any SI/HI or thoughts of self-harm along with any past history of mania or psychosis.  Patient also endorsed symptoms of anxiety including feeling nervous or on edge, being unable to control her worrying, worrying too much with different things, difficulty relaxing, and irritability.  Of note patient had a left frontoparietal parenchymal hemorrhage in March 2011 which correlated with the onset of her cognitive impairment, onset of seizures, and onset of depressive symptoms.  Patient was started on Keppra  around that time and we discussed the possibility that it could be contributing to her increased irritability/agitation.    Winton GORMAN Gulledge presents for follow-up evaluation. Today, 03/19/24, patient reports mood has improved in the interim.  She does still have residual grief and related depression however the frequency of episodes is decreasing with time.  Patient has also noticed some improvement in sleep and self tapered trazodone  to 25-50 milligrams.  As she is  still sleeping well throughout the night would be appropriate to continue on this regimen.  Patient still finds the Klonopin  effective and is still using the prescription from December 2024.  She denied any concerns of balance on the day she uses the medication.  Would be appropriate to continue on the Klonopin  and Lexapro  at this time.  Patient will follow up in 3 months.  Plan: - Continue Lexapro  (escitalopram ) to 10 mg at bedtime  - Decrease trazodone  to 25-50 mg at bedtime as needed - Continue Klonopin  0.25 QD prn for anxiety (still have a few tabs from 12/24 fill) - EKG reviewed QTC 462 - Discuss Keppra  with neurologist - Continue Grief counseling - CBC, CMP, lipid profile, Vit D, and TSH reviewed - Follow up in 3 months   Chief Complaint:  Chief Complaint  Patient presents with   Follow-up   HPI: [name] presents reporting that things have been pretty good the last 6 months. She can still have her days of sadness related to her son Norleen and passing. Most of the time she goes about things as if he is still here.  This has helped ease some of the grief.  Now the bad days are typically related to reminders of him or anniversaries such as his birthday.  Patient reports things with her husband been going a bit better.  She has felt that he has become a bit better through therapy.  Not as minimizing compared to the past.  She also thinks she is doing better at standing up for herself and setting boundaries on things that do bother her.  Medication wise patient reports that she continues takes Lexapro  daily with  good effect.  The trazodone  she is decreased to 25-50 mg at bedtime and noticed that she still does sleep well.  There is some difficulty with sleep initiation and she will often lie asleep for an hour.  It is that this is longstanding and she is not overly fatigued upon waking up the next day.  Did discuss sleep hygiene related to this.  She denies adverse side effects from either  medications.  In regards to the Klonopin  she has not used it in a while.  Typically she will only take it during days of extreme sadness or agitation.  She will also take it on difficult days such as the anniversary of her son's death or his birthday.  She is still using the prescription from 08/24/2023 and has a few more pills left.  Past Psychiatric History: Patient reports that her psychiatric symptoms/depression started after 2011 when she had her stroke prior to that she reports minimal to no depressive symptoms.  She has been on Celexa  for as long as she can remember post stroke and believes she was started on Xanax  at the same time.  Patient denies any prior psychiatric hospitalizations or past suicide attempts.  She denies any current substance use since 2005 and reports drinking alcohol when she was younger.  Past Medical History:  Past Medical History:  Diagnosis Date   Abnormal gait    Amnesia    Anemia    hx   Aneurysm (HCC)    Anxiety    Asthma    Atrophic vaginitis    Benign hypertensive heart disease    Cardiomegaly    Chest pain    COPD (chronic obstructive pulmonary disease) (HCC)     - PFTs  01/24/06 FEV1 69% ratio 68% diffusing capacity 57% with no improvement after B2   - PFT's 10/1/ 09 FEV1 77   ratio  76    dlco                   45            no resp to B2    - Nl alpha one antitripsin level 10/09   Cough    Depression    Difficulty speaking    Disorder of nasal cavity    Dyspnea    Enthesopathy of elbow region    GERD (gastroesophageal reflux disease)    Hammer toe    Headache(784.0)    Hemiparesis affecting dominant side as late effect of cerebrovascular accident (HCC)    History of kidney stones    Hyperlipidemia    Hypertension    Hypothyroidism    Increased urinary frequency    Low compliance bladder    Macular degeneration of both eyes    Malaise and fatigue    Nonruptured cerebral aneurysm    Obesity    Pneumonia    hx   Pure  hypercholesterolemia    Recurrent major depressive episodes (HCC)    Seizure disorder, secondary (HCC)    2-3 focal siezures monthly   Seizures (HCC)    Short-term memory loss    since stroke   Skin sensation disturbance    Stroke [facility]) 2011   rt sided weakness   Swelling of limb    TMJ (temporomandibular joint disorder)    Urinary tract infectious disease    Vertigo as late effect of stroke    Vitamin D  deficiency     Past Surgical History:  Procedure Laterality Date   ABDOMINAL  HYSTERECTOMY     ANTERIOR CERVICAL DECOMP/DISCECTOMY FUSION N/A 09/09/2021   Procedure: Anterior  Cervical Decompression Fusion  Cervical four-five, Cervical five-six;  Surgeon: Carollee Lani BROCKS, DO;  Location: [facility] OR;  Service: Neurosurgery;  Laterality: N/A;   BACK SURGERY     CARPAL TUNNEL RELEASE Left 09/09/2021   Procedure: LEFT CARPAL TUNNEL RELEASE;  Surgeon: Carollee Lani BROCKS, DO;  Location: [facility] OR;  Service: Neurosurgery;  Laterality: Left;   CEREBRAL ANGIOGRAM     x3   COLONOSCOPY WITH PROPOFOL  N/A 08/18/2016   Procedure: COLONOSCOPY WITH PROPOFOL ;  Surgeon: Belvie Just, MD;  Location: [facility] ENDOSCOPY;  Service: Endoscopy;  Laterality: N/A;   LAPAROSCOPIC HYSTERECTOMY     MANDIBLE FRACTURE SURGERY     RADIOLOGY WITH ANESTHESIA N/A 08/21/2013   Procedure: RADIOLOGY WITH ANESTHESIA;  Surgeon: Thyra MARLA Nash, MD;  Location: [facility] OR;  Service: Radiology;  Laterality: N/A;   stones     no surgery    Family Psychiatric History: Denies  Family History:  Family History  Problem Relation Age of Onset   Heart disease Mother    Squamous cell carcinoma Mother 23   Heart disease Father    Heart attack Father    Prostate cancer Father 40       met to bones   Melanoma Father 60   Lung cancer Brother 42   Lymphoma Brother 20   Testicular cancer Brother 20   Colon cancer Maternal Aunt 54   Prostate cancer Maternal Uncle 74   Throat cancer Paternal Aunt 28   Cancer Paternal Uncle        unknown type    Ovarian cancer Maternal Grandmother 60   Stomach cancer Maternal Grandfather 13   Breast cancer Cousin 28       maternal first cousin   Lung cancer Other    Colon cancer Maternal Great-grandmother     Social History:  Social History   Socioeconomic History   Marital status: Married    Spouse name: Tanda   Number of children: 2   Years of education: 14   Highest education level: Tax adviser degree: occupational, Scientist, product/process development, or vocational program  Occupational History   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    Drug use: No   Sexual activity: Not Currently  Other Topics Concern   Not on file  Social History Narrative   Patient lives at home with her husband ETTER Tanda).   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    Patient is on disability.    Patient has an Associates degree.   Patient is right-handed.   Patient drinks 2 cups of green tea most days.               Social Drivers of Dispensing optician  Resource Strain: Low Risk  (11/30/2023)   Overall Financial Resource Strain (CARDIA)    Difficulty of Paying Living Expenses: Not hard at all  Food Insecurity: No Food Insecurity (11/30/2023)   Hunger Vital Sign    Worried About Running Out of Food in the Last Year: Never true    Ran Out of Food in the Last Year: Never true  Transportation Needs: No Transportation Needs (11/30/2023)   PRAPARE -  Administrator, Civil Service (Medical): No    Lack of Transportation (Non-Medical): No  Physical Activity: Sufficiently Active (11/30/2023)   Exercise Vital Sign    Days of Exercise per Week: 4 days    Minutes of Exercise per Session: 40 min  Stress: Stress Concern Present (11/30/2023)   [employer] of Occupational Health - Occupational Stress Questionnaire    Feeling of Stress : To some extent  Social Connections: Moderately Integrated (11/30/2023)   Social Connection and Isolation Panel    Frequency of Communication with Friends and Family: More than three times a week    Frequency of Social Gatherings with Friends and Family: Twice a week    Attends Religious Services: More than 4 times per year    Active Member of [employer] or Organizations: No    Attends Banker Meetings: Never    Marital Status: Married  Recent Concern: Social Connections - Moderately Isolated (10/25/2023)   Social Connection and Isolation Panel    Frequency of Communication with Friends and Family: More than three times a week    Frequency of Social Gatherings with Friends and Family: Twice a week    Attends Religious Services: Never    Database administrator or Organizations: No    Attends Banker Meetings: Never    Marital Status: Married    Allergies:  Allergies  Allergen Reactions   Phenobarbital Hives, Shortness Of Breath and Swelling   Phenytoin  Anaphylaxis, Hives and Swelling    Current Medications: Current Outpatient Medications  Medication Sig Dispense Refill   clonazePAM  (KLONOPIN ) 0.5 MG tablet Take 0.5 tablets (0.25 mg total) by mouth daily as needed for anxiety. 15 tablet 0   escitalopram  (LEXAPRO ) 10 MG tablet Take 1 tablet (10 mg total) by mouth daily. 90 tablet 0   albuterol  (PROVENTIL ) (2.5 MG/3ML) 0.083% nebulizer solution Take 3 mLs (2.5 mg total) by nebulization every 6 (six) hours. 1080 mL 1   albuterol  (VENTOLIN  HFA) 108 (90 Base) MCG/ACT  inhaler Inhale 2 puffs into the lungs every 6 (six) hours as needed for wheezing or shortness of breath. 24 g 3   aspirin  EC 81 MG tablet Take 81 mg by mouth daily.     Biotin 1 MG CAPS Take 1 tablet by mouth 2 (two) times daily. Viviscal     Calcium -Magnesium-Zinc (CAL-MAG-ZINC PO) Take 1 tablet by mouth daily.     Cholecalciferol (VITAMIN D -3) 1000 units CAPS Take 2,000 Units by mouth at bedtime.     clopidogrel  (PLAVIX ) 75 MG tablet TAKE 1 TABLET DAILY 90 tablet 3   diclofenac  Sodium (VOLTAREN ) 1 % GEL Voltaren  1 % topical gel  APPLY 2 GRAM TO THE AFFECTED AREA(S) BY TOPICAL ROUTE 4 TIMES PER DAY 350 g 2   fluticasone (FLONASE) 50 MCG/ACT nasal spray Place 2 sprays into both nostrils daily as needed (allergies or post-nasl drip).  3   levETIRAcetam  (KEPPRA ) 500 MG tablet Take 1 tablet (500 mg total) by mouth every 12 (twelve) hours. 180 tablet 3  levothyroxine  (SYNTHROID ) 50 MCG tablet TAKE 1 TABLET ON SATURDAY AND SUNDAY 26 tablet 3   levothyroxine  (SYNTHROID ) 75 MCG tablet TAKE 1 TABLET IN THE MORNING BEFORE BREAKFAST MONDAY THROUGH FRIDAY (TAKE 50 MCG ON OTHER DAYS) 51 tablet 4   Multiple Vitamins-Minerals (PRESERVISION/LUTEIN PO) Take 1 tablet by mouth daily.     Omega-3 Fatty Acids (FISH OIL) 1000 MG CAPS Take 2,000 mg by mouth 2 (two) times daily.      OXYGEN  Inhale 2 L into the lungs at bedtime.     Polyethyl Glycol-Propyl Glycol (SYSTANE) 0.4-0.3 % SOLN Place 1 drop into both eyes daily as needed (dry eyes).     Psyllium (FIBER) 28.3 % POWD Take 10 mLs by mouth 2 (two) times daily.     rosuvastatin  (CRESTOR ) 20 MG tablet TAKE 1 TABLET DAILY 90 tablet 3   telmisartan  (MICARDIS ) 20 MG tablet Take 1 tablet (20 mg total) by mouth daily. 90 tablet 3   tiZANidine  (ZANAFLEX ) 4 MG tablet TAKE 1 TABLET EVERY EVENING AS NEEDED 60 tablet 5   traZODone  (DESYREL ) 50 MG tablet Take 0.5-1 tablets (25-50 mg total) by mouth at bedtime as needed for sleep. 90 tablet 0   umeclidinium-vilanterol (ANORO  ELLIPTA) 62.5-25 MCG/ACT AEPB Inhale 1 puff into the lungs daily. 30 each 5   vitamin C (ASCORBIC ACID) 500 MG tablet Take 1,000 mg by mouth at bedtime. Rose hips     No current facility-administered medications for this visit.     Musculoskeletal: Strength & Muscle Tone: decreased and right hemiparesis secondary to past stroke Gait & Station: unsteady Patient leans: Left  Psychiatric Specialty Exam: Review of Systems  Blood pressure (!) 145/82, pulse 88, height 5' 2.5 (1.588 m), weight 153 lb (69.4 kg).Body mass index is 27.54 kg/m.  General Appearance: Well Groomed  Eye Contact:  Good  Speech:  Clear and Coherent and Normal Rate  Volume:  Normal  Mood:  mildly depressed  Affect:  Congruent  Thought Process:  Coherent  Orientation:  Full (Time, Place, and Person)  Thought Content: Logical, Illogical, and Rumination   Suicidal Thoughts:  No  Homicidal Thoughts:  No  Memory:  Immediate;   Fair  Judgement:  Good  Insight:  Good  Psychomotor Activity:  Normal  Concentration:  Concentration: Good  Recall:  Fair  Fund of Knowledge: Fair  Language: Good  Akathisia:  NA    AIMS (if indicated): not done  Assets:  Communication Skills Desire for Improvement Housing Intimacy Resilience Social Support Transportation Vocational/Educational  ADL's:  Intact  Cognition: WNL  Sleep:  Fair   Metabolic Disorder Labs: Lab Results  Component Value Date   HGBA1C 5.6 12/06/2023   No results found for: PROLACTIN Lab Results  Component Value Date   CHOL 149 03/23/2023   TRIG 103 03/23/2023   HDL 74 03/23/2023   CHOLHDL 2.0 03/23/2023   VLDL 15 11/23/2009   LDLCALC 57 03/23/2023   LDLCALC 35 05/18/2022   Lab Results  Component Value Date   TSH 0.488 12/06/2023   TSH 0.824 03/23/2023    Therapeutic Level Labs: No results found for: LITHIUM No results found for: VALPROATE No results found for: CBMZ   Screenings: GAD-7    Flowsheet Row [facility]  [facility]  Total GAD-7 Score 16   Mini-Mental    Flowsheet Row Clinical Support from 09/03/2018 in [facility] Office Visit from 11/03/2015 in [facility] Office Visit from 05/05/2015 in [facility] Office Visit from 11/05/2014 in [facility]  Total Score (max 30 points ) 29 27 26 28    PHQ2-9    Flowsheet Row Office Visit from 12/06/2023 in [facility] Clinical Support from 10/25/2023 in [facility] Office Visit from 07/25/2023 in [facility] Office Visit from 03/23/2023 in [facility] Office Visit from 11/21/2022 in [facility]  PHQ-2 Total Score 0 0 2 4 0  PHQ-9 Total Score 0 3 7 9 5    Flowsheet Row UC from 08/09/2022 in [facility] Office Visit from 07[facility]04/2022 in [facility] Admission (Discharged) from 09/09/2021 in [facility]  C-SSRS RISK CATEGORY No Risk No Risk No Risk    Collaboration of Care: Collaboration of Care: Medication Management AEB medication prescription and Other provider involved in patient's care AEB cardiology chart review  Patient/Guardian was advised Release of Information must be obtained prior to any record release in order to collaborate their care with an outside provider. Patient/Guardian was advised if they have not already done so to contact the registration department to sign all necessary forms in order for us  to release information regarding their care.   Consent: Patient/Guardian gives verbal consent for treatment and assignment of benefits for services provided during this visit. Patient/Guardian  expressed understanding and agreed to proceed.    Arvella CHRISTELLA Finder, MD 03/19/2024, 11:12 AM

## 2024-03-19 ENCOUNTER — Other Ambulatory Visit: Payer: Self-pay

## 2024-03-19 ENCOUNTER — Encounter (HOSPITAL_COMMUNITY): Payer: Self-pay | Admitting: Psychiatry

## 2024-03-19 ENCOUNTER — Ambulatory Visit (HOSPITAL_BASED_OUTPATIENT_CLINIC_OR_DEPARTMENT_OTHER): Admitting: Psychiatry

## 2024-03-19 DIAGNOSIS — F331 Major depressive disorder, recurrent, moderate: Secondary | ICD-10-CM

## 2024-03-19 DIAGNOSIS — F411 Generalized anxiety disorder: Secondary | ICD-10-CM | POA: Diagnosis not present

## 2024-03-19 MED ORDER — CLONAZEPAM 0.5 MG PO TABS: 0.2500 mg | ORAL_TABLET | Freq: Every day | ORAL | 0 refills | Status: AC | PRN

## 2024-03-19 MED ORDER — TRAZODONE HCL 50 MG PO TABS
25.0000 mg | ORAL_TABLET | Freq: Every evening | ORAL | 0 refills | Status: DC | PRN
Start: 2024-03-19 — End: 2024-06-18

## 2024-03-19 MED ORDER — ESCITALOPRAM OXALATE 10 MG PO TABS: 10.0000 mg | ORAL_TABLET | Freq: Every day | ORAL | 0 refills | Status: DC

## 2024-03-21 ENCOUNTER — Ambulatory Visit: Payer: Self-pay | Admitting: Genetic Counselor

## 2024-03-21 DIAGNOSIS — Z1379 Encounter for other screening for genetic and chromosomal anomalies: Secondary | ICD-10-CM

## 2024-03-21 NOTE — Progress Notes (Signed)
 HPI:   [name] was previously seen in the  Cancer Genetics clinic due to a family history of cancer and concerns regarding a hereditary predisposition to cancer. Please refer to our prior cancer genetics clinic note for more information regarding our discussion, assessment and recommendations, at the time. [name] recent genetic test results were disclosed to her, as were recommendations warranted by these results. These results and recommendations are discussed in more detail below.  CANCER HISTORY:  Oncology History   No history exists.    FAMILY HISTORY:  We obtained a detailed, 4-generation family history.  Significant diagnoses are listed below:      Family History  Problem Relation Age of Onset   Heart disease Mother     Squamous cell carcinoma Mother 65   Heart disease Father     Heart attack Father     Prostate cancer Father 23        met to bones   Melanoma Father 59   Lung cancer Brother 25   Lymphoma Brother 20   Testicular cancer Brother 20   Colon cancer Maternal Aunt 54   Prostate cancer Maternal Uncle 74   Throat cancer Paternal Aunt 30   Cancer Paternal Uncle          unknown type   Ovarian cancer Maternal Grandmother 73   Stomach cancer Maternal Grandfather 60   Breast cancer Cousin 40        maternal first cousin   Lung cancer Other     Colon cancer Maternal Great-grandmother          ]     [name] is unaware of previous family history of genetic testing for hereditary cancer risks. There is no reported Ashkenazi Jewish ancestry.    GENETIC TEST RESULTS:  The [facility] CancerNext-Expanded Panel found no pathogenic mutations.   The CancerNext-Expanded gene panel offered by [facility] and includes sequencing, rearrangement, and RNA analysis for the following 77 genes: AIP, ALK, APC, ATM, AXIN2, BAP1, BARD1, BMPR1A, BRCA1, BRCA2, BRIP1, CDC73, CDH1, CDK4, CDKN1B, CDKN2A, CEBPA, CHEK2, CTNNA1, DDX41, DICER1, ETV6, FH,  FLCN, GATA2, LZTR1, MAX, MBD4, MEN1, MET, MLH1, MSH2, MSH3, MSH6, MUTYH, NF1, NF2, NTHL1, PALB2, PHOX2B, PMS2, POT1, PRKAR1A, PTCH1, PTEN, RAD51C, RAD51D, RB1, RET, RPS20, RUNX1, SDHA, SDHAF2, SDHB, SDHC, SDHD, SMAD4, SMARCA4, SMARCB1, SMARCE1, STK11, SUFU, TMEM127, TP53, TSC1, TSC2, VHL, and WT1 (sequencing and deletion/duplication); EGFR, HOXB13, KIT, MITF, PDGFRA, POLD1, and POLE (sequencing only); EPCAM and GREM1 (deletion/duplication only).    The test report has been scanned into EPIC and is located under the Molecular Pathology section of the Results Review tab.  A portion of the result report is included below for reference. Genetic testing reported out on 03/10/2024.       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  At this time, it is unknown if this variant is associated with an increased risk for cancer or if it is benign, but most uncertain variants are reclassified to benign. It should not be used to make medical management decisions. With time, we suspect the laboratory will determine the significance of this variant, if any. If the laboratory reclassifies this variant, we will attempt to contact [name] to discuss it further.   Even though a pathogenic variant was not identified, possible explanations for the cancer in the family may include: There may be no hereditary risk for cancer in the family. The cancers in her family  may be due to other genetic or environmental factors. There may be a gene mutation in one of these genes that current testing methods cannot detect, but that chance is small. There could be another gene that has not yet been discovered, or that we have not yet tested, that is responsible for the cancer diagnoses in the family.  It is also possible there is a hereditary cause for the cancer in the family that [name] did not inherit.  Therefore, it is important to remain in touch with  cancer genetics in the future so that we can continue to offer [name] the most up to date genetic testing.   ADDITIONAL GENETIC TESTING:  We discussed with Ms. Hoel that her genetic testing was fairly extensive.  If there are genes identified to increase cancer risk that can be analyzed in the future, we would be happy to discuss and coordinate this testing at that time.    CANCER SCREENING RECOMMENDATIONS:  Ms. Even test result is considered negative (normal).  This means that we have not identified a hereditary cause for her family history of cancer at this time.   An individual's cancer risk and medical management are not determined by genetic test results alone. Overall cancer risk assessment incorporates additional factors, including personal medical history, family history, and any available genetic information that may result in a personalized plan for cancer prevention and surveillance. Therefore, it is recommended she continue to follow the cancer management and screening guidelines provided by her primary healthcare provider.  Based on the reported personal and family history, specific cancer screenings for [name] and her family include:  Breast Cancer Screening:  The Tyrer-Cuzick model is one of multiple prediction models developed to estimate an individual's lifetime risk of developing breast cancer. The Tyrer-Cuzick model is endorsed by the [employer] ([employer]CCN). This model includes many risk factors such as family history, endogenous estrogen exposure, and benign breast disease. The calculation is highly-dependent on the accuracy of clinical data provided by the patient and can change over time. The Tyrer-Cuzick model may be repeated to reflect new information in her personal or family history in the future.   [name] Tyrer-Cuzick risk score is 4.7%. She is encouraged to contact us  regarding any changes to  her personal or family history, as her recommendations for screening would be altered significantly if her lifetime risk is determined to be greater than 20% based on updated information.    RECOMMENDATIONS FOR FAMILY MEMBERS:   Since she did not inherit a mutation in a cancer predisposition gene included on this panel, her children could not have inherited a mutation from her in one of these genes. Individuals in this family might be at some increased risk of developing cancer, over the general population risk, due to the family history of cancer. We recommend women in this family have a yearly mammogram beginning at age 49, or 48 years younger than the earliest onset of cancer, an annual clinical breast exam, and perform monthly breast self-exams.  Other members of the family may still carry a pathogenic variant in one of these genes that Ms. Mccart did not inherit. Based on the family history, we recommend her mother and brother have genetic counseling and testing.  We do not recommend familial testing for the DICER1 variant of uncertain significance (VUS).  FOLLOW-UP:  Cancer genetics is a rapidly advancing field and it is possible that new genetic tests will be appropriate for  her and/or her family members in the future. We encouraged her to remain in contact with cancer genetics on an annual basis so we can update her personal and family histories and let her know of advances in cancer genetics that may benefit this family.   Our contact number was provided. Ms. Mezera questions were answered to her satisfaction, and she knows she is welcome to call us  at anytime with additional questions or concerns.   Chanci Ojala, MS, [facility] Genetic Counselor [email or web address] (P) [phone]

## 2024-03-25 ENCOUNTER — Ambulatory Visit (INDEPENDENT_AMBULATORY_CARE_PROVIDER_SITE_OTHER): Admitting: Podiatry

## 2024-03-25 ENCOUNTER — Ambulatory Visit (INDEPENDENT_AMBULATORY_CARE_PROVIDER_SITE_OTHER)

## 2024-03-25 ENCOUNTER — Encounter: Payer: Self-pay | Admitting: Podiatry

## 2024-03-25 VITALS — Ht 62.5 in | Wt 153.0 lb

## 2024-03-25 DIAGNOSIS — M2042 Other hammer toe(s) (acquired), left foot: Secondary | ICD-10-CM | POA: Diagnosis not present

## 2024-03-25 DIAGNOSIS — M2041 Other hammer toe(s) (acquired), right foot: Secondary | ICD-10-CM

## 2024-03-25 DIAGNOSIS — M205X1 Other deformities of toe(s) (acquired), right foot: Secondary | ICD-10-CM | POA: Diagnosis not present

## 2024-03-25 DIAGNOSIS — I69398 Other sequelae of cerebral infarction: Secondary | ICD-10-CM | POA: Diagnosis not present

## 2024-03-25 DIAGNOSIS — R252 Cramp and spasm: Secondary | ICD-10-CM | POA: Diagnosis not present

## 2024-03-25 NOTE — Progress Notes (Signed)
 Chief Complaint  Patient presents with   Hammer Toe    Pt is here due to right foot, was wanting to get information regarding botox in the foot due to toes curling over after stroke.    HPI: 68 y.o. 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    Brief history: History of stroke.  In the past she has worn an AFO which seem to alleviate her symptoms.  Over the last 2 years she discontinued the AFO because she thought she was healed but unfortunately the foot drop and cavus deformity of the foot returned.  Past Medical History:  Diagnosis Date   Abnormal gait    Amnesia    Anemia    hx   Aneurysm (HCC)    Anxiety    Asthma    Atrophic vaginitis    Benign hypertensive heart disease    Cardiomegaly    Chest pain    COPD (chronic obstructive pulmonary disease) (HCC)     - PFTs  01/24/06 FEV1 69% ratio 68% diffusing capacity 57% with no improvement after B2   - PFT's 10/1/ 09 FEV1 77   ratio  76    dlco                   45            no resp to B2    - Nl alpha one antitripsin level 10/09   Cough    Depression    Difficulty speaking    Disorder of nasal cavity    Dyspnea    Enthesopathy of elbow region    GERD (gastroesophageal reflux disease)    Hammer toe    Headache(784.0)    Hemiparesis affecting dominant side as late effect of cerebrovascular accident (HCC)    History of kidney stones    Hyperlipidemia    Hypertension    Hypothyroidism    Increased urinary frequency    Low compliance bladder    Macular degeneration of both eyes    Malaise and fatigue    Nonruptured cerebral aneurysm    Obesity    Pneumonia    hx   Pure hypercholesterolemia    Recurrent major depressive episodes (HCC)    Seizure disorder, secondary (HCC)    2-3 focal siezures monthly   Seizures (HCC)    Short-term memory loss    since stroke   Skin sensation disturbance     Stroke [employer]) 2011   rt sided weakness   Swelling of limb    TMJ (temporomandibular joint disorder)    Urinary tract infectious disease    Vertigo as late effect of stroke    Vitamin D  deficiency     Past Surgical History:  Procedure Laterality Date   ABDOMINAL HYSTERECTOMY     ANTERIOR CERVICAL DECOMP/DISCECTOMY FUSION N/A 09/09/2021   Procedure: Anterior  Cervical Decompression Fusion  Cervical four-five, Cervical five-six;  Surgeon: Carollee Lani BROCKS, DO;  Location: [facility] OR;  Service: Neurosurgery;  Laterality: N/A;   BACK SURGERY     CARPAL TUNNEL RELEASE Left 09/09/2021   Procedure: LEFT CARPAL TUNNEL RELEASE;  Surgeon: Carollee Lani BROCKS, DO;  Location: [facility] OR;  Service: Neurosurgery;  Laterality: Left;   CEREBRAL ANGIOGRAM     x3   COLONOSCOPY WITH PROPOFOL  N/A 08/18/2016   Procedure: COLONOSCOPY WITH PROPOFOL ;  Surgeon: Belvie Just, MD;  Location:  [facility];  Service: Endoscopy;  Laterality: N/A;   LAPAROSCOPIC HYSTERECTOMY     MANDIBLE FRACTURE SURGERY     RADIOLOGY WITH ANESTHESIA N/A 08/21/2013   Procedure: RADIOLOGY WITH ANESTHESIA;  Surgeon: Thyra MARLA Nash, MD;  Location: [facility] OR;  Service: Radiology;  Laterality: N/A;   stones     no surgery    Allergies  Allergen Reactions   Phenobarbital Hives, Shortness Of Breath and Swelling   Phenytoin  Anaphylaxis, Hives and Swelling     Physical Exam: General: The patient is alert and oriented x3 in no acute distress.  Dermatology: Skin is warm, dry and supple bilateral lower extremities.   Vascular: Palpable pedal pulses bilaterally. Capillary refill within normal limits.  No appreciable edema.  No erythema.  Neurological: Grossly intact via light touch  Musculoskeletal Exam: SemiReducible cavovarus deformity of the foot noted with loss of peroneal muscle strength.  Inability to dorsiflex and evert the foot consistent with a foot drop deformity secondary to stroke  Radiographic Exam RT foot 01/17/2024:  Cavovarus  deformity noted.  No acute fracture.  Joint spaces preserved  Assessment/Plan of Care: 1.  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   -Continue AFO bracing -Today we discussed different treatment modalities including Botox injections to address her neuro spasticity and lower extremity contracture.  I do believe this is a viable option prior to any surgical intervention -Referral placed for neurology consult at [facility] -Return to clinic with me PRN     Thresa EMERSON Sar, DPM [facility]  Dr. Thresa EMERSON Sar, DPM    [address] [place], [place] [address]                Office [phone]  Fax [phone]

## 2024-03-27 ENCOUNTER — Telehealth: Payer: Self-pay | Admitting: Podiatry

## 2024-03-27 NOTE — Telephone Encounter (Signed)
 Requesting referral for Neurology to be faxed to [facility] @ [phone]

## 2024-03-27 NOTE — Telephone Encounter (Signed)
 Referral sent

## 2024-03-27 NOTE — Telephone Encounter (Signed)
 Called Pt and relayed message. Thank you

## 2024-03-28 NOTE — Telephone Encounter (Signed)
 Thank you, Irma and Nikki.

## 2024-04-09 DIAGNOSIS — H353123 Nonexudative age-related macular degeneration, left eye, advanced atrophic without subfoveal involvement: Secondary | ICD-10-CM | POA: Diagnosis not present

## 2024-04-09 DIAGNOSIS — H353211 Exudative age-related macular degeneration, right eye, with active choroidal neovascularization: Secondary | ICD-10-CM | POA: Diagnosis not present

## 2024-04-22 DIAGNOSIS — R0981 Nasal congestion: Secondary | ICD-10-CM | POA: Diagnosis not present

## 2024-04-22 DIAGNOSIS — R509 Fever, unspecified: Secondary | ICD-10-CM | POA: Diagnosis not present

## 2024-04-22 DIAGNOSIS — R051 Acute cough: Secondary | ICD-10-CM | POA: Diagnosis not present

## 2024-04-22 DIAGNOSIS — R519 Headache, unspecified: Secondary | ICD-10-CM | POA: Diagnosis not present

## 2024-05-14 DIAGNOSIS — H353123 Nonexudative age-related macular degeneration, left eye, advanced atrophic without subfoveal involvement: Secondary | ICD-10-CM | POA: Diagnosis not present

## 2024-05-26 ENCOUNTER — Other Ambulatory Visit (HOSPITAL_COMMUNITY): Payer: Self-pay | Admitting: Psychiatry

## 2024-05-26 ENCOUNTER — Other Ambulatory Visit: Payer: Self-pay | Admitting: Internal Medicine

## 2024-05-26 DIAGNOSIS — F411 Generalized anxiety disorder: Secondary | ICD-10-CM

## 2024-05-26 DIAGNOSIS — F331 Major depressive disorder, recurrent, moderate: Secondary | ICD-10-CM

## 2024-06-06 ENCOUNTER — Ambulatory Visit: Admitting: Internal Medicine

## 2024-06-06 ENCOUNTER — Encounter: Payer: Self-pay | Admitting: Internal Medicine

## 2024-06-06 VITALS — BP 132/82 | HR 84 | Temp 98.3°F | Ht 62.0 in | Wt 155.8 lb

## 2024-06-06 DIAGNOSIS — E7800 Pure hypercholesterolemia, unspecified: Secondary | ICD-10-CM | POA: Diagnosis not present

## 2024-06-06 DIAGNOSIS — M5416 Radiculopathy, lumbar region: Secondary | ICD-10-CM | POA: Diagnosis not present

## 2024-06-06 DIAGNOSIS — I119 Hypertensive heart disease without heart failure: Secondary | ICD-10-CM | POA: Diagnosis not present

## 2024-06-06 DIAGNOSIS — Z23 Encounter for immunization: Secondary | ICD-10-CM

## 2024-06-06 DIAGNOSIS — R3121 Asymptomatic microscopic hematuria: Secondary | ICD-10-CM

## 2024-06-06 DIAGNOSIS — I2510 Atherosclerotic heart disease of native coronary artery without angina pectoris: Secondary | ICD-10-CM

## 2024-06-06 DIAGNOSIS — I700 Atherosclerosis of aorta: Secondary | ICD-10-CM

## 2024-06-06 DIAGNOSIS — R7309 Other abnormal glucose: Secondary | ICD-10-CM | POA: Diagnosis not present

## 2024-06-06 DIAGNOSIS — E039 Hypothyroidism, unspecified: Secondary | ICD-10-CM | POA: Diagnosis not present

## 2024-06-06 DIAGNOSIS — E785 Hyperlipidemia, unspecified: Secondary | ICD-10-CM | POA: Diagnosis not present

## 2024-06-06 LAB — POCT URINALYSIS DIP (CLINITEK)
Bilirubin, UA: NEGATIVE
Blood, UA: NEGATIVE
Glucose, UA: NEGATIVE mg/dL
Ketones, POC UA: NEGATIVE mg/dL
Leukocytes, UA: NEGATIVE
Nitrite, UA: NEGATIVE
POC PROTEIN,UA: NEGATIVE
Spec Grav, UA: 1.015 (ref 1.010–1.025)
Urobilinogen, UA: 0.2 U/dL
pH, UA: 6 (ref 5.0–8.0)

## 2024-06-06 MED ORDER — TELMISARTAN 20 MG PO TABS: 20.0000 mg | ORAL_TABLET | Freq: Every day | ORAL | 3 refills | Status: AC

## 2024-06-06 NOTE — Patient Instructions (Signed)
 Hypertension, Adult Hypertension is another name for high blood pressure. High blood pressure forces your heart to work harder to pump blood. This can cause problems over time. There are two numbers in a blood pressure reading. There is a top number (systolic) over a bottom number (diastolic). It is best to have a blood pressure that is below 120/80. What are the causes? The cause of this condition is not known. Some other conditions can lead to high blood pressure. What increases the risk? Some lifestyle factors can make you more likely to develop high blood pressure: Smoking. Not getting enough exercise or physical activity. Being overweight. Having too much fat, sugar, calories, or salt (sodium) in your diet. Drinking too much alcohol. Other risk factors include: Having any of these conditions: Heart disease. Diabetes. High cholesterol. Kidney disease. Obstructive sleep apnea. Having a family history of high blood pressure and high cholesterol. Age. The risk increases with age. Stress. What are the signs or symptoms? High blood pressure may not cause symptoms. Very high blood pressure (hypertensive crisis) may cause: Headache. Fast or uneven heartbeats (palpitations). Shortness of breath. Nosebleed. Vomiting or feeling like you may vomit (nauseous). Changes in how you see. Very bad chest pain. Feeling dizzy. Seizures. How is this treated? This condition is treated by making healthy lifestyle changes, such as: Eating healthy foods. Exercising more. Drinking less alcohol. Your doctor may prescribe medicine if lifestyle changes do not help enough and if: Your top number is above 130. Your bottom number is above 80. Your personal target blood pressure may vary. Follow these instructions at home: Eating and drinking  If told, follow the DASH eating plan. To follow this plan: Fill one half of your plate at each meal with fruits and vegetables. Fill one fourth of your plate  at each meal with whole grains. Whole grains include whole-wheat pasta, brown rice, and whole-grain bread. Eat or drink low-fat dairy products, such as skim milk or low-fat yogurt. Fill one fourth of your plate at each meal with low-fat (lean) proteins. Low-fat proteins include fish, chicken without skin, eggs, beans, and tofu. Avoid fatty meat, cured and processed meat, or chicken with skin. Avoid pre-made or processed food. Limit the amount of salt in your diet to less than 1,500 mg each day. Do not drink alcohol if: Your doctor tells you not to drink. You are pregnant, may be pregnant, or are planning to become pregnant. If you drink alcohol: Limit how much you have to: 0-1 drink a day for women. 0-2 drinks a day for men. Know how much alcohol is in your drink. In the [place] one drink equals one 12 oz bottle of beer (355 mL), one 5 oz glass of wine (148 mL), or one 1 oz glass of hard liquor (44 mL). Lifestyle  Work with your doctor to stay at a healthy weight or to lose weight. Ask your doctor what the best weight is for you. Get at least 30 minutes of exercise that causes your heart to beat faster (aerobic exercise) most days of the week. This may include walking, swimming, or biking. Get at least 30 minutes of exercise that strengthens your muscles (resistance exercise) at least 3 days a week. This may include lifting weights or doing Pilates. Do not smoke or use any products that contain nicotine or tobacco. If you need help quitting, ask your doctor. Check your blood pressure at home as told by your doctor. Keep all follow-up visits. Medicines Take over-the-counter and prescription medicines  only as told by your doctor. Follow directions carefully. Do not skip doses of blood pressure medicine. The medicine does not work as well if you skip doses. Skipping doses also puts you at risk for problems. Ask your doctor about side effects or reactions to medicines that you should watch  for. Contact a doctor if: You think you are having a reaction to the medicine you are taking. You have headaches that keep coming back. You feel dizzy. You have swelling in your ankles. You have trouble with your vision. Get help right away if: You get a very bad headache. You start to feel mixed up (confused). You feel weak or numb. You feel faint. You have very bad pain in your: Chest. Belly (abdomen). You vomit more than once. You have trouble breathing. These symptoms may be an emergency. Get help right away. Call 911. Do not wait to see if the symptoms will go away. Do not drive yourself to the hospital. Summary Hypertension is another name for high blood pressure. High blood pressure forces your heart to work harder to pump blood. For most people, a normal blood pressure is less than 120/80. Making healthy choices can help lower blood pressure. If your blood pressure does not get lower with healthy choices, you may need to take medicine. This information is not intended to replace advice given to you by your health care provider. Make sure you discuss any questions you have with your health care provider. Document Revised: 06/10/2021 Document Reviewed: 06/10/2021 Elsevier Patient Education  2024 [employer].

## 2024-06-06 NOTE — Assessment & Plan Note (Signed)
 Chronic, LDL goal is less than 70.  She will continue with rosuvastatin  20mg  daily.  - Follow heart healthy lifestyle.

## 2024-06-06 NOTE — Assessment & Plan Note (Signed)
 Incidental finding on CT A/P performed 11/06/2018--[facility]. LDL goal < 70.  She will c/w rosuvastatin 20mg  and ASA 81mg  daily.

## 2024-06-06 NOTE — Assessment & Plan Note (Signed)
 Previous labs reviewed, her A1c has been elevated in the past. I will check an A1c today. Reminded to avoid refined sugars including sugary drinks/foods and processed meats including bacon, sausages and deli meats.

## 2024-06-06 NOTE — Assessment & Plan Note (Addendum)
 Increased low back pain with right hip radiation and left leg tingling. History of back and neck surgeries. Possible degenerative changes. - Refer to physical therapy for back pain management. - Consider orthopedic referral based on x-ray and physical therapy outcomes.

## 2024-06-06 NOTE — Assessment & Plan Note (Signed)
 Chronic, blood pressure at 132/82 mmHg, slightly above target. Compliant with medications and engages in regular exercise. - Continue current antihypertensive regimen, telmisartan  20mg  daily.  - Encourage continued regular exercise and healthy diet. - Follow low sodium diet.  - F/u six months.  - Refilled telmisartan  prescription and sent to [facility]. - Encouraged home blood pressure monitoring.

## 2024-06-06 NOTE — Progress Notes (Signed)
 tI,Victoria T Emmitt, CMA,acting as a Neurosurgeon for Catheryn LOISE Slocumb, MD.,have documented all relevant documentation on the behalf of Catheryn LOISE Slocumb, MD,as directed by  Catheryn LOISE Slocumb, MD while in the presence of Catheryn LOISE Slocumb, MD.  Subjective:  Patient ID: [name] , female    DOB: 02/12/1956 , 68 y.o.   MRN: [id number]  Chief Complaint  Patient presents with   Hypertension    She presents today for BP, thyroid  & chol check.  She reports compliance with meds.  She reports compliance with medication daily.  She denies having any dizziness, palpitations, and chest pain.  She complains of lower back pain. She states this pain has happened more frequently recently. She is not currently est with an orthopedic.     Hypothyroidism   Hyperlipidemia    HPI Discussed the use of AI scribe software for clinical note transcription with the patient, who gave verbal consent to proceed.  History of Present Illness Roann S Ruacho P. Beverley is a 68 year old female with hypertension and a history of stroke who presents for a blood pressure check and evaluation of low back pain.  She experiences significant low back pain, worsening over the past year. The pain is described as dull, throbbing, sharp, and stabbing, varying with activity, and sometimes makes it difficult for her to get up. She has a history of back surgery approximately 20-25 years ago and neck surgery in 2023, which did not fully resolve her neck issues but are considered manageable.  She experiences tingling in her legs, with new tingling in the left leg. The tingling in the left leg is noticeable but not severe. She also experiences pain radiating down her right hip.  She has a history of hypertension and is currently taking telmisartan  20 mg daily for blood pressure management. She does not monitor her blood pressure at home.  She is on multiple medications including albuterol  inhaler as needed, aspirin , Klonopin  as needed,  Plavix  75 mg daily, Lexapro  10 mg daily, Keppra  twice daily, levothyroxine  50 mg on weekends and 75 mg Monday through Friday, rosuvastatin  20 mg daily, trazodone  at night, and tizanidine  as needed, though she reports taking it nightly for muscle spasms.  She uses oxygen  at night and has a history of tobacco use. She is scheduled for a low-dose CT scan for her lungs in December due to her tobacco history.  She reports occasional bowel control issues, which have not changed recently. She takes fiber supplements to manage constipation, which helps her maintain regular bowel movements.  She mentions experiencing reflux after eating a heavy meal, such as a hamburger and french fries, which affected her sleep.   Hypertension This is a chronic problem. The current episode started more than 1 year ago. The problem has been gradually improving since onset. The problem is controlled. Pertinent negatives include no blurred vision, chest pain, palpitations or shortness of breath. Risk factors for coronary artery disease include post-menopausal state and sedentary lifestyle. The current treatment provides moderate improvement. Compliance problems include exercise.      Past Medical History:  Diagnosis Date   Abnormal gait    Amnesia    Anemia    hx   Aneurysm    Anxiety    Asthma    Atrophic vaginitis    Benign hypertensive heart disease    Cardiomegaly    Chest pain    COPD (chronic obstructive pulmonary disease) (HCC)     - PFTs  01/24/06  FEV1 69% ratio 68% diffusing capacity 57% with no improvement after B2   - PFT's 10/1/ 09 FEV1 77   ratio  76    dlco                   45            no resp to B2    - Nl alpha one antitripsin level 10/09   Cough    Depression    Difficulty speaking    Disorder of nasal cavity    Dyspnea    Enthesopathy of elbow region    GERD (gastroesophageal reflux disease)    Hammer toe    Headache(784.0)    Hemiparesis affecting dominant side as late effect of  cerebrovascular accident (HCC)    History of kidney stones    Hyperlipidemia    Hypertension    Hypothyroidism    Increased urinary frequency    Low compliance bladder    Macular degeneration of both eyes    Malaise and fatigue    Nonruptured cerebral aneurysm    Obesity    Pneumonia    hx   Pure hypercholesterolemia    Recurrent major depressive episodes    Seizure disorder, secondary (HCC)    2-3 focal siezures monthly   Seizures (HCC)    Short-term memory loss    since stroke   Skin sensation disturbance    Stroke (HCC) 2011   rt sided weakness   Swelling of limb    TMJ (temporomandibular joint disorder)    Urinary tract infectious disease    Vertigo as late effect of stroke    Vitamin D  deficiency      Family History  Problem Relation Age of Onset   Heart disease Mother    Squamous cell carcinoma Mother 30   Heart disease Father    Heart attack Father    Prostate cancer Father 32       met to bones   Melanoma Father 64   Lung cancer Brother 68   Lymphoma Brother 20   Testicular cancer Brother 20   Colon cancer Maternal Aunt 54   Prostate cancer Maternal Uncle 74   Throat cancer Paternal Aunt 60   Cancer Paternal Uncle        unknown type   Ovarian cancer Maternal Grandmother 54   Stomach cancer Maternal Grandfather 93   Breast cancer Cousin 42       maternal first cousin   Lung cancer Other    Colon cancer Maternal Great-grandmother      Current Outpatient Medications:    albuterol  (PROVENTIL ) (2.5 MG/3ML) 0.083% nebulizer solution, Take 3 mLs (2.5 mg total) by nebulization every 6 (six) hours., Disp: 1080 mL, Rfl: 1   albuterol  (VENTOLIN  HFA) 108 (90 Base) MCG/ACT inhaler, Inhale 2 puffs into the lungs every 6 (six) hours as needed for wheezing or shortness of breath., Disp: 24 g, Rfl: 3   aspirin  EC 81 MG tablet, Take 81 mg by mouth daily., Disp: , Rfl:    Biotin 1 MG CAPS, Take 1 tablet by mouth 2 (two) times daily. Viviscal, Disp: , Rfl:     Calcium -Magnesium-Zinc (CAL-MAG-ZINC PO), Take 1 tablet by mouth daily., Disp: , Rfl:    Cholecalciferol (VITAMIN D -3) 1000 units CAPS, Take 2,000 Units by mouth at bedtime., Disp: , Rfl:    clonazePAM  (KLONOPIN ) 0.5 MG tablet, Take 0.5 tablets (0.25 mg total) by mouth daily as needed for anxiety., Disp: 15  tablet, Rfl: 0   clopidogrel  (PLAVIX ) 75 MG tablet, TAKE 1 TABLET DAILY, Disp: 90 tablet, Rfl: 3   diclofenac  Sodium (VOLTAREN ) 1 % GEL, Voltaren  1 % topical gel  APPLY 2 GRAM TO THE AFFECTED AREA(S) BY TOPICAL ROUTE 4 TIMES PER DAY, Disp: 350 g, Rfl: 2   escitalopram  (LEXAPRO ) 10 MG tablet, TAKE 1 TABLET DAILY, Disp: 90 tablet, Rfl: 3   fluticasone (FLONASE) 50 MCG/ACT nasal spray, Place 2 sprays into both nostrils daily as needed (allergies or post-nasl drip)., Disp: , Rfl: 3   levETIRAcetam  (KEPPRA ) 500 MG tablet, Take 1 tablet (500 mg total) by mouth every 12 (twelve) hours., Disp: 180 tablet, Rfl: 3   levothyroxine  (SYNTHROID ) 50 MCG tablet, TAKE 1 TABLET ON SATURDAY AND SUNDAY, Disp: 26 tablet, Rfl: 3   levothyroxine  (SYNTHROID ) 75 MCG tablet, TAKE 1 TABLET IN THE MORNING BEFORE BREAKFAST MONDAY THROUGH FRIDAY (TAKE 50 MCG ON OTHER DAYS), Disp: 51 tablet, Rfl: 4   Multiple Vitamins-Minerals (PRESERVISION/LUTEIN PO), Take 1 tablet by mouth daily., Disp: , Rfl:    Omega-3 Fatty Acids (FISH OIL) 1000 MG CAPS, Take 2,000 mg by mouth 2 (two) times daily. , Disp: , Rfl:    OXYGEN , Inhale 2 L into the lungs at bedtime., Disp: , Rfl:    Polyethyl Glycol-Propyl Glycol (SYSTANE) 0.4-0.3 % SOLN, Place 1 drop into both eyes daily as needed (dry eyes)., Disp: , Rfl:    Psyllium (FIBER) 28.3 % POWD, Take 10 mLs by mouth 2 (two) times daily., Disp: , Rfl:    rosuvastatin  (CRESTOR ) 20 MG tablet, TAKE 1 TABLET DAILY, Disp: 90 tablet, Rfl: 3   tiZANidine  (ZANAFLEX ) 4 MG tablet, TAKE 1 TABLET EVERY EVENING AS NEEDED, Disp: 60 tablet, Rfl: 5   traZODone  (DESYREL ) 50 MG tablet, Take 0.5-1 tablets (25-50 mg  total) by mouth at bedtime as needed for sleep., Disp: 90 tablet, Rfl: 0   umeclidinium-vilanterol (ANORO ELLIPTA ) 62.5-25 MCG/ACT AEPB, Inhale 1 puff into the lungs daily., Disp: 30 each, Rfl: 5   vitamin C (ASCORBIC ACID) 500 MG tablet, Take 1,000 mg by mouth at bedtime. Rose hips, Disp: , Rfl:    telmisartan  (MICARDIS ) 20 MG tablet, Take 1 tablet (20 mg total) by mouth daily., Disp: 90 tablet, Rfl: 3   Allergies  Allergen Reactions   Phenobarbital Hives, Shortness Of Breath and Swelling   Phenytoin  Anaphylaxis, Hives and Swelling     Review of Systems  Constitutional: Negative.   Eyes:  Negative for blurred vision.  Respiratory: Negative.  Negative for shortness of breath.   Cardiovascular: Negative.  Negative for chest pain and palpitations.  Neurological: Negative.   Psychiatric/Behavioral: Negative.       Today's Vitals   06/06/24 1054  BP: 132/82  Pulse: 84  Temp: 98.3 F (36.8 C)  SpO2: 98%  Weight: 155 lb 12.8 oz (70.7 kg)  Height: 5' 2 (1.575 m)   Body mass index is 28.5 kg/m.  Wt Readings from Last 3 Encounters:  06/06/24 155 lb 12.8 oz (70.7 kg)  03/25/24 153 lb (69.4 kg)  01/30/24 156 lb 1.6 oz (70.8 kg)     Objective:  Physical Exam Vitals and nursing note reviewed.  Constitutional:      Appearance: Normal appearance.  HENT:     Head: Normocephalic and atraumatic.  Eyes:     Extraocular Movements: Extraocular movements intact.  Cardiovascular:     Rate and Rhythm: Normal rate and regular rhythm.     Heart sounds: Normal heart  sounds.  Pulmonary:     Effort: Pulmonary effort is normal.     Breath sounds: Normal breath sounds.  Musculoskeletal:     Comments: Ambulatory with four prong cane Abnormal gait Neg straight leg test  Skin:    General: Skin is warm.  Neurological:     General: No focal deficit present.     Mental Status: She is alert.  Psychiatric:        Mood and Affect: Mood normal.        Behavior: Behavior normal.          Assessment And Plan:  Hypertensive heart disease without heart failure Assessment & Plan: Chronic, blood pressure at 132/82 mmHg, slightly above target. Compliant with medications and engages in regular exercise. - Continue current antihypertensive regimen, telmisartan  20mg  daily.  - Encourage continued regular exercise and healthy diet. - Follow low sodium diet.  - F/u six months.  - Refilled telmisartan  prescription and sent to [facility]. - Encouraged home blood pressure monitoring.  Orders: -     CMP14+EGFR  Coronary artery calcification seen on CT scan Assessment & Plan: Incidental finding on CT A/P performed 11/06/2018--[facility]. LDL goal < 70.  She will c/w rosuvastatin  20mg  and ASA 81mg  daily.     Aortic atherosclerosis Assessment & Plan: Incidental finding on CT A/P performed 11/06/2018--[facility]. LDL goal < 70.  She will c/w rosuvastatin  20mg  and ASA 81mg  daily.     Acquired hypothyroidism Assessment & Plan: On thyroid  hormone replacement: 75 mcg Monday-Friday, 50 mcg on weekends. Thyroid  function tests pending. - Order thyroid  function tests.  Orders: -     Lipid panel -     TSH + free T4  Pure hypercholesterolemia Assessment & Plan:  Chronic, LDL goal is less than 70.  She will continue with rosuvastatin  20mg  daily.  - Follow heart healthy lifestyle.   Orders: -     Lipid panel  Lumbar radiculopathy Assessment & Plan:  Increased low back pain with right hip radiation and left leg tingling. History of back and neck surgeries. Possible degenerative changes. - Refer to physical therapy for back pain management. - Consider orthopedic referral based on x-ray and physical therapy outcomes.  Orders: -     Ambulatory referral to Physical Therapy  Other abnormal glucose Assessment & Plan: Previous labs reviewed, her A1c has been elevated in the past. I will check an A1c today. Reminded to avoid refined sugars including sugary drinks/foods and  processed meats including bacon, sausages and deli meats.    Orders: -     CMP14+EGFR -     Hemoglobin A1c  Asymptomatic microscopic hematuria -     POCT URINALYSIS DIP (CLINITEK)  Immunization due -     Flu vaccine HIGH DOSE PF(Fluzone Trivalent)  Other orders -     Telmisartan ; Take 1 tablet (20 mg total) by mouth daily.  Dispense: 90 tablet; Refill: 3    Return in 6 months (on 12/05/2024), or bp check.  Patient was given opportunity to ask questions. Patient verbalized understanding of the plan and was able to repeat key elements of the plan. All questions were answered to their satisfaction.   I, Catheryn LOISE Slocumb, MD, have reviewed all documentation for this visit. The documentation on 06/06/24 for the exam, diagnosis, procedures, and orders are all accurate and complete.   IF YOU HAVE BEEN REFERRED TO A SPECIALIST, IT MAY TAKE 1-2 WEEKS TO SCHEDULE/PROCESS THE REFERRAL. IF YOU HAVE NOT HEARD FROM US /SPECIALIST  IN TWO WEEKS, PLEASE GIVE US  A CALL AT [phone] X [phone].   THE PATIENT IS ENCOURAGED TO PRACTICE SOCIAL DISTANCING DUE TO THE COVID-19 PANDEMIC.

## 2024-06-06 NOTE — Assessment & Plan Note (Signed)
 On thyroid hormone replacement: 75 mcg Monday-Friday, 50 mcg on weekends. Thyroid function tests pending. - Order thyroid function tests.

## 2024-06-07 LAB — CMP14+EGFR
ALT: 27 IU/L (ref 0–32)
AST: 29 IU/L (ref 0–40)
Albumin: 4.4 g/dL (ref 3.9–4.9)
Alkaline Phosphatase: 111 IU/L (ref 49–135)
BUN/Creatinine Ratio: 27 (ref 12–28)
BUN: 18 mg/dL (ref 8–27)
Bilirubin Total: 0.6 mg/dL (ref 0.0–1.2)
CO2: 30 mmol/L — ABNORMAL HIGH (ref 20–29)
Calcium: 9.6 mg/dL (ref 8.7–10.3)
Chloride: 104 mmol/L (ref 96–106)
Creatinine, Ser: 0.67 mg/dL (ref 0.57–1.00)
Globulin, Total: 2.3 g/dL (ref 1.5–4.5)
Glucose: 89 mg/dL (ref 70–99)
Potassium: 5 mmol/L (ref 3.5–5.2)
Sodium: 145 mmol/L — ABNORMAL HIGH (ref 134–144)
Total Protein: 6.7 g/dL (ref 6.0–8.5)
eGFR: 96 mL/min/1.73 (ref >59)

## 2024-06-07 LAB — HEMOGLOBIN A1C
Est. average glucose Bld gHb Est-mCnc: 117 mg/dL
Hgb A1c MFr Bld: 5.7 % — ABNORMAL HIGH (ref 4.8–5.6)

## 2024-06-07 LAB — TSH+FREE T4
Free T4: 1.21 ng/dL (ref 0.82–1.77)
TSH: 1.13 u[IU]/mL (ref 0.450–4.500)

## 2024-06-07 LAB — LIPID PANEL
Chol/HDL Ratio: 1.7 ratio (ref 0.0–4.4)
Cholesterol, Total: 121 mg/dL (ref 100–199)
HDL: 72 mg/dL
LDL Chol Calc (NIH): 35 mg/dL (ref 0–99)
Triglycerides: 64 mg/dL (ref 0–149)
VLDL Cholesterol Cal: 14 mg/dL (ref 5–40)

## 2024-06-08 ENCOUNTER — Ambulatory Visit: Payer: Self-pay | Admitting: Internal Medicine

## 2024-06-11 DIAGNOSIS — H353123 Nonexudative age-related macular degeneration, left eye, advanced atrophic without subfoveal involvement: Secondary | ICD-10-CM | POA: Diagnosis not present

## 2024-06-17 NOTE — Progress Notes (Unsigned)
 [facility] MD/PA/NP OP Progress Note  06/18/2024 12:35 PM [name]  MRN:  [id number]  Visit Diagnosis:    ICD-10-CM   1. GAD (generalized anxiety disorder)  F41.1 traZODone  (DESYREL ) 50 MG tablet    2. Moderate episode of recurrent major depressive disorder (HCC)  F33.1 traZODone  (DESYREL ) 50 MG tablet      Assessment: [name] is a 68 y.o. 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    During initial evaluation patient reported symptoms of depression including anhedonia, feelings of hopelessness, increased fatigue, decreased appetite, difficulty sleeping, difficulty concentrating, and feelings of worthlessness.  She denied any SI/HI or thoughts of self-harm along with any past history of mania or psychosis.  Patient also endorsed symptoms of anxiety including feeling nervous or on edge, being unable to control her worrying, worrying too much with different things, difficulty relaxing, and irritability.  Of note patient had a left frontoparietal parenchymal hemorrhage in March 2011 which correlated with the onset of her cognitive impairment, onset of seizures, and onset of depressive symptoms.  Patient was started on Keppra  around that time and we discussed the possibility that it could be contributing to her increased irritability/agitation.    [name] presents for follow-up evaluation. Today, 06/18/24, patient reports an increase in grief in the interim.  The symptoms seem to be slowly progressing and likely in part exacerbated to some ongoing interpersonal stressors with her partner.  Recommend patient to reestablish grief counseling in addition to both her and her partner attending couples counseling.  She is taking medication consistently and denies any adverse side effects.  Will continue on  current regimen and follow-up in 2 months.  Psychotherapeutic interventions were used during today's session. From 10:05 AM to 10:36 AM. Therapeutic interventions included empathic listening, supportive therapy, cognitive and behavioral therapy, motivational interviewing. Used supportive interviewing techniques to provide emotional validation. Improvement was evidenced by patient's participation and identified commitment to therapy goals.   Plan: - Continue Lexapro  (escitalopram ) to 10 mg at bedtime  - Decrease trazodone  to 25-50 mg at bedtime as needed - Continue Klonopin  0.25 QD prn for anxiety (still have a few tabs from 12/24 fill) - EKG reviewed QTC 462 - Discuss Keppra  with neurologist - Restart grief counseling - Recommend couples counseling - CBC, CMP, lipid profile, Vit D, and TSH reviewed - Follow up in 2 months   Chief Complaint:  Chief Complaint  Patient presents with   Follow-up   HPI: [name] presents reporting that things have been alright. Not much has happened, other then her husband is driving her nuts. He had stopped taking his psychiatric medicines and she has noticed that he has been getting irritable more frequently and taking it out on her verbally. He can cuss at her and call her names. These occur over little things. [name] finds this very upsetting and difficult to deal with. It had been better when he was on the medicine in the past. She thinks he has been off of it for 1-2 months now.  Patient reports that irritability was a common problem in the past and it drove her to alcohol use and attempt to get through it while she was raising the kids.  While she did endorse negatives in the relationship patient also identifies a positive his.  She does believe he loves her and he  has helped her out a lot with her overall care and attending doctors appointments.  Discussed couples counseling to help the to address some of these concerns.  Patient did express interest  noting they had tried it once in the past.  At that time it only lasted 1 session before her husband declined to attend further.  Reviewed the point of couples therapy and the goal of finding a space where the 2 of them could meet in the middle.  Provided with resources for couples therapy.  [name] also endorsed grief in regards to her son.  She has felt that this has been getting worse lately and she started pretend that he still alive.  She can often find herself missing him and feeling excessively tearful when she thinks about it.  Empathic listening techniques were used and support was provided.  She had attended grief counseling for couple months after his passing which was helpful.  Suggested reestablishing the grief counseling moving forward for increased support.  She is taking her medications consistently and denies any adverse side effects.  Past Psychiatric History: Patient reports that her psychiatric symptoms/depression started after 2011 when she had her stroke prior to that she reports minimal to no depressive symptoms.  She has been on Celexa  for as long as she can remember post stroke and believes she was started on Xanax  at the same time.  Patient denies any prior psychiatric hospitalizations or past suicide attempts.  She denies any current substance use since 2005 and reports drinking alcohol when she was younger.  Past Medical History:  Past Medical History:  Diagnosis Date   Abnormal gait    Amnesia    Anemia    hx   Aneurysm    Anxiety    Asthma    Atrophic vaginitis    Benign hypertensive heart disease    Cardiomegaly    Chest pain    COPD (chronic obstructive pulmonary disease) (HCC)     - PFTs  01/24/06 FEV1 69% ratio 68% diffusing capacity 57% with no improvement after B2   - PFT's 10/1/ 09 FEV1 77   ratio  76    dlco                   45            no resp to B2    - Nl alpha one antitripsin level 10/09   Cough    Depression    Difficulty speaking    Disorder of  nasal cavity    Dyspnea    Enthesopathy of elbow region    GERD (gastroesophageal reflux disease)    Hammer toe    Headache(784.0)    Hemiparesis affecting dominant side as late effect of cerebrovascular accident (HCC)    History of kidney stones    Hyperlipidemia    Hypertension    Hypothyroidism    Increased urinary frequency    Low compliance bladder    Macular degeneration of both eyes    Malaise and fatigue    Nonruptured cerebral aneurysm    Obesity    Pneumonia    hx   Pure hypercholesterolemia    Recurrent major depressive episodes    Seizure disorder, secondary (HCC)    2-3 focal siezures monthly   Seizures (HCC)    Short-term memory loss    since stroke   Skin sensation disturbance    Stroke (HCC) 2011   rt sided weakness   Swelling of limb  TMJ (temporomandibular joint disorder)    Urinary tract infectious disease    Vertigo as late effect of stroke    Vitamin D  deficiency     Past Surgical History:  Procedure Laterality Date   ABDOMINAL HYSTERECTOMY     ANTERIOR CERVICAL DECOMP/DISCECTOMY FUSION N/A 09/09/2021   Procedure: Anterior  Cervical Decompression Fusion  Cervical four-five, Cervical five-six;  Surgeon: Carollee Lani BROCKS, DO;  Location: [facility] OR;  Service: Neurosurgery;  Laterality: N/A;   BACK SURGERY     CARPAL TUNNEL RELEASE Left 09/09/2021   Procedure: LEFT CARPAL TUNNEL RELEASE;  Surgeon: Carollee Lani BROCKS, DO;  Location: [facility] OR;  Service: Neurosurgery;  Laterality: Left;   CEREBRAL ANGIOGRAM     x3   COLONOSCOPY WITH PROPOFOL  N/A 08/18/2016   Procedure: COLONOSCOPY WITH PROPOFOL ;  Surgeon: Belvie Just, MD;  Location: [facility];  Service: Endoscopy;  Laterality: N/A;   LAPAROSCOPIC HYSTERECTOMY     MANDIBLE FRACTURE SURGERY     RADIOLOGY WITH ANESTHESIA N/A 08/21/2013   Procedure: RADIOLOGY WITH ANESTHESIA;  Surgeon: Thyra MARLA Nash, MD;  Location: [facility] OR;  Service: Radiology;  Laterality: N/A;   stones     no surgery    Family  Psychiatric History: Denies  Family History:  Family History  Problem Relation Age of Onset   Heart disease Mother    Squamous cell carcinoma Mother 53   Heart disease Father    Heart attack Father    Prostate cancer Father 64       met to bones   Melanoma Father 35   Lung cancer Brother 40   Lymphoma Brother 20   Testicular cancer Brother 20   Colon cancer Maternal Aunt 54   Prostate cancer Maternal Uncle 74   Throat cancer Paternal Aunt 78   Cancer Paternal Uncle        unknown type   Ovarian cancer Maternal Grandmother 57   Stomach cancer Maternal Grandfather 66   Breast cancer Cousin 20       maternal first cousin   Lung cancer Other    Colon cancer Maternal Great-grandmother     Social History:  Social History   Socioeconomic History   Marital status: Married    Spouse name: [name]   Number of children: 2   Years of education: 14   Highest education level: Tax adviser degree: occupational, Scientist, product/process development, or vocational program  Occupational History   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    Drug use: No   Sexual activity: Not Currently  Other Topics Concern   Not on file  Social History Narrative   Patient lives at home with her husband [name]).   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    Patient is on disability.    Patient has  an Associates degree.   Patient is right-handed.    Patient drinks 2 cups of green tea most days.               Social Drivers of Corporate investment banker Strain: Low Risk  (11/30/2023)   Overall Financial Resource Strain (CARDIA)    Difficulty of Paying Living Expenses: Not hard at all  Food Insecurity: No Food Insecurity (11/30/2023)   Hunger Vital Sign    Worried About Running Out of Food in the Last Year: Never true    Ran Out of Food in the Last Year: Never true  Transportation Needs: No Transportation Needs (11/30/2023)   PRAPARE - Administrator, Civil Service (Medical): No    Lack of Transportation (Non-Medical): No  Physical Activity: Sufficiently Active (11/30/2023)   Exercise Vital Sign    Days of Exercise per Week: 4 days    Minutes of Exercise per Session: 40 min  Stress: Stress Concern Present (11/30/2023)   [employer] of Occupational Health - Occupational Stress Questionnaire    Feeling of Stress : To some extent  Social Connections: Moderately Integrated (11/30/2023)   Social Connection and Isolation Panel    Frequency of Communication with Friends and Family: More than three times a week    Frequency of Social Gatherings with Friends and Family: Twice a week    Attends Religious Services: More than 4 times per year    Active Member of [employer] or Organizations: No    Attends Banker Meetings: Never    Marital Status: Married  Recent Concern: Social Connections - Moderately Isolated (10/25/2023)   Social Connection and Isolation Panel    Frequency of Communication with Friends and Family: More than three times a week    Frequency of Social Gatherings with Friends and Family: Twice a week    Attends Religious Services: Never    Database administrator or Organizations: No    Attends Banker Meetings: Never    Marital Status: Married    Allergies:  Allergies  Allergen Reactions   Phenobarbital Hives, Shortness Of Breath and Swelling   Phenytoin  Anaphylaxis, Hives and  Swelling    Current Medications: Current Outpatient Medications  Medication Sig Dispense Refill   albuterol  (PROVENTIL ) (2.5 MG/3ML) 0.083% nebulizer solution Take 3 mLs (2.5 mg total) by nebulization every 6 (six) hours. 1080 mL 1   albuterol  (VENTOLIN  HFA) 108 (90 Base) MCG/ACT inhaler Inhale 2 puffs into the lungs every 6 (six) hours as needed for wheezing or shortness of breath. 24 g 3   aspirin  EC 81 MG tablet Take 81 mg by mouth daily.     Biotin 1 MG CAPS Take 1 tablet by mouth 2 (two) times daily. Viviscal     Calcium -Magnesium-Zinc (CAL-MAG-ZINC PO) Take 1 tablet by mouth daily.     Cholecalciferol (VITAMIN D -3) 1000 units CAPS Take 2,000 Units by mouth at bedtime.     clonazePAM  (KLONOPIN ) 0.5 MG tablet Take 0.5 tablets (0.25 mg total) by mouth daily as needed for anxiety. 15 tablet 0   clopidogrel  (PLAVIX ) 75 MG tablet TAKE 1 TABLET DAILY 90 tablet 3   diclofenac  Sodium (VOLTAREN ) 1 % GEL Voltaren  1 % topical gel  APPLY 2 GRAM TO THE AFFECTED AREA(S) BY TOPICAL ROUTE 4 TIMES PER DAY 350 g 2   escitalopram  (LEXAPRO ) 10 MG tablet TAKE 1 TABLET DAILY 90 tablet 3   fluticasone (FLONASE) 50 MCG/ACT nasal spray Place 2 sprays  into both nostrils daily as needed (allergies or post-nasl drip).  3   levETIRAcetam  (KEPPRA ) 500 MG tablet Take 1 tablet (500 mg total) by mouth every 12 (twelve) hours. 180 tablet 3   levothyroxine  (SYNTHROID ) 50 MCG tablet TAKE 1 TABLET ON SATURDAY AND SUNDAY 26 tablet 3   levothyroxine  (SYNTHROID ) 75 MCG tablet TAKE 1 TABLET IN THE MORNING BEFORE BREAKFAST MONDAY THROUGH FRIDAY (TAKE 50 MCG ON OTHER DAYS) 51 tablet 4   Multiple Vitamins-Minerals (PRESERVISION/LUTEIN PO) Take 1 tablet by mouth daily.     Omega-3 Fatty Acids (FISH OIL) 1000 MG CAPS Take 2,000 mg by mouth 2 (two) times daily.      OXYGEN  Inhale 2 L into the lungs at bedtime.     Polyethyl Glycol-Propyl Glycol (SYSTANE) 0.4-0.3 % SOLN Place 1 drop into both eyes daily as needed (dry eyes).      Psyllium (FIBER) 28.3 % POWD Take 10 mLs by mouth 2 (two) times daily.     rosuvastatin  (CRESTOR ) 20 MG tablet TAKE 1 TABLET DAILY 90 tablet 3   telmisartan  (MICARDIS ) 20 MG tablet Take 1 tablet (20 mg total) by mouth daily. 90 tablet 3   tiZANidine  (ZANAFLEX ) 4 MG tablet TAKE 1 TABLET EVERY EVENING AS NEEDED 60 tablet 5   traZODone  (DESYREL ) 50 MG tablet Take 0.5-1 tablets (25-50 mg total) by mouth at bedtime as needed for sleep. 90 tablet 0   umeclidinium-vilanterol (ANORO ELLIPTA ) 62.5-25 MCG/ACT AEPB Inhale 1 puff into the lungs daily. 30 each 5   vitamin C (ASCORBIC ACID) 500 MG tablet Take 1,000 mg by mouth at bedtime. Rose hips     No current facility-administered medications for this visit.     Musculoskeletal: Strength & Muscle Tone: decreased and right hemiparesis secondary to past stroke Gait & Station: unsteady Patient leans: Left  Psychiatric Specialty Exam: Review of Systems  Blood pressure 138/73, pulse 81, height 5' 2 (1.575 m), weight 153 lb (69.4 kg).Body mass index is 27.98 kg/m.  General Appearance: Well Groomed  Eye Contact:  Good  Speech:  Clear and Coherent and Normal Rate  Volume:  Normal  Mood:  mildly depressed  Affect:  Congruent  Thought Process:  Coherent  Orientation:  Full (Time, Place, and Person)  Thought Content: Logical, Illogical, and Rumination   Suicidal Thoughts:  No  Homicidal Thoughts:  No  Memory:  Immediate;   Fair  Judgement:  Good  Insight:  Good  Psychomotor Activity:  Normal  Concentration:  Concentration: Good  Recall:  Fair  Fund of Knowledge: Fair  Language: Good  Akathisia:  NA    AIMS (if indicated): not done  Assets:  Communication Skills Desire for Improvement Housing Intimacy Resilience Social Support Transportation Vocational/Educational  ADL's:  Intact  Cognition: WNL  Sleep:  Fair   Metabolic Disorder Labs: Lab Results  Component Value Date   HGBA1C 5.7 (H) 06/06/2024   No results found for:  PROLACTIN Lab Results  Component Value Date   CHOL 121 06/06/2024   TRIG 64 06/06/2024   HDL 72 06/06/2024   CHOLHDL 1.7 06/06/2024   VLDL 15 11/23/2009   LDLCALC 35 06/06/2024   LDLCALC 57 03/23/2023   Lab Results  Component Value Date   TSH 1.130 06/06/2024   TSH 0.488 12/06/2023    Therapeutic Level Labs: No results found for: LITHIUM No results found for: VALPROATE No results found for: CBMZ   Screenings: GAD-7    Flowsheet Row [facility]  Total GAD-7 Score 16   Mini-Mental    Flowsheet Row Clinical Support from 09/03/2018 in [facility] Office Visit from 11/03/2015 in [facility] Office Visit from 05/05/2015 in [facility] Office Visit from 11/05/2014 in [facility]  Total Score (max 30 points ) 29 27 26 28    PHQ2-9    Flowsheet Row Office Visit from 06/06/2024 in [facility] Office Visit from 12/06/2023 in [facility] Clinical Support from 10/25/2023 in [facility] Office Visit from 07/25/2023 in [facility] Office Visit from 03/23/2023 in [facility]  PHQ-2 Total Score 0 0 0 2 4  PHQ-9 Total Score 0 0 3 7 9    Flowsheet Row UC from 08/09/2022 in [facility] Office Visit from 07/04/2022 in [facility] Admission (Discharged) from 09/09/2021 in [facility]  C-SSRS RISK CATEGORY No Risk No Risk No Risk    Collaboration of Care: Collaboration of Care: Medication Management AEB medication prescription and Other provider involved in patient's care AEB PCP, podiatry, and cardiology chart  review  Patient/Guardian was advised Release of Information must be obtained prior to any record release in order to collaborate their care with an outside provider. Patient/Guardian was advised if they have not already done so to contact the registration department to sign all necessary forms in order for us  to release information regarding their care.   Consent: Patient/Guardian gives verbal consent for treatment and assignment of benefits for services provided during this visit. Patient/Guardian expressed understanding and agreed to proceed.    Arvella CHRISTELLA Finder, MD 06/18/2024, 12:35 PM

## 2024-06-18 ENCOUNTER — Ambulatory Visit (HOSPITAL_BASED_OUTPATIENT_CLINIC_OR_DEPARTMENT_OTHER): Admitting: Psychiatry

## 2024-06-18 VITALS — BP 138/73 | HR 81 | Ht 62.0 in | Wt 153.0 lb

## 2024-06-18 DIAGNOSIS — F331 Major depressive disorder, recurrent, moderate: Secondary | ICD-10-CM

## 2024-06-18 DIAGNOSIS — F411 Generalized anxiety disorder: Secondary | ICD-10-CM

## 2024-06-18 MED ORDER — TRAZODONE HCL 50 MG PO TABS: 25.0000 mg | ORAL_TABLET | Freq: Every evening | ORAL | 0 refills | Status: DC | PRN

## 2024-07-04 DIAGNOSIS — R2689 Other abnormalities of gait and mobility: Secondary | ICD-10-CM | POA: Diagnosis not present

## 2024-07-04 DIAGNOSIS — M5416 Radiculopathy, lumbar region: Secondary | ICD-10-CM | POA: Diagnosis not present

## 2024-07-04 DIAGNOSIS — M6281 Muscle weakness (generalized): Secondary | ICD-10-CM | POA: Diagnosis not present

## 2024-07-09 DIAGNOSIS — H353123 Nonexudative age-related macular degeneration, left eye, advanced atrophic without subfoveal involvement: Secondary | ICD-10-CM | POA: Diagnosis not present

## 2024-07-11 DIAGNOSIS — M5416 Radiculopathy, lumbar region: Secondary | ICD-10-CM | POA: Diagnosis not present

## 2024-07-11 DIAGNOSIS — R2689 Other abnormalities of gait and mobility: Secondary | ICD-10-CM | POA: Diagnosis not present

## 2024-07-11 DIAGNOSIS — M6281 Muscle weakness (generalized): Secondary | ICD-10-CM | POA: Diagnosis not present

## 2024-07-16 ENCOUNTER — Ambulatory Visit (INDEPENDENT_AMBULATORY_CARE_PROVIDER_SITE_OTHER): Admitting: Pulmonary Disease

## 2024-07-16 ENCOUNTER — Ambulatory Visit: Admitting: *Deleted

## 2024-07-16 ENCOUNTER — Encounter: Payer: Self-pay | Admitting: Pulmonary Disease

## 2024-07-16 VITALS — BP 136/66 | HR 93 | Ht 62.0 in | Wt 154.0 lb

## 2024-07-16 DIAGNOSIS — Z9981 Dependence on supplemental oxygen: Secondary | ICD-10-CM | POA: Diagnosis not present

## 2024-07-16 DIAGNOSIS — R2689 Other abnormalities of gait and mobility: Secondary | ICD-10-CM | POA: Diagnosis not present

## 2024-07-16 DIAGNOSIS — G4734 Idiopathic sleep related nonobstructive alveolar hypoventilation: Secondary | ICD-10-CM

## 2024-07-16 DIAGNOSIS — R0602 Shortness of breath: Secondary | ICD-10-CM

## 2024-07-16 DIAGNOSIS — M6281 Muscle weakness (generalized): Secondary | ICD-10-CM | POA: Diagnosis not present

## 2024-07-16 DIAGNOSIS — M5416 Radiculopathy, lumbar region: Secondary | ICD-10-CM | POA: Diagnosis not present

## 2024-07-16 DIAGNOSIS — J449 Chronic obstructive pulmonary disease, unspecified: Secondary | ICD-10-CM

## 2024-07-16 DIAGNOSIS — R0609 Other forms of dyspnea: Secondary | ICD-10-CM

## 2024-07-16 LAB — PULMONARY FUNCTION TEST
DL/VA % pred: 71 %
DL/VA: 3.02 ml/min/mmHg/L
DLCO cor % pred: 51 %
DLCO cor: 9.71 ml/min/mmHg
DLCO unc % pred: 51 %
DLCO unc: 9.71 ml/min/mmHg
FEF 25-75 Post: 0.85 L/s
FEF 25-75 Pre: 0.47 L/s
FEF2575-%Change-Post: 79 %
FEF2575-%Pred-Post: 42 %
FEF2575-%Pred-Pre: 23 %
FEV1-%Change-Post: 18 %
FEV1-%Pred-Post: 55 %
FEV1-%Pred-Pre: 47 %
FEV1-Post: 1.26 L
FEV1-Pre: 1.07 L
FEV1FVC-%Change-Post: 8 %
FEV1FVC-%Pred-Pre: 79 %
FEV6-%Change-Post: 10 %
FEV6-%Pred-Post: 66 %
FEV6-%Pred-Pre: 60 %
FEV6-Post: 1.9 L
FEV6-Pre: 1.72 L
FEV6FVC-%Change-Post: 0 %
FEV6FVC-%Pred-Post: 103 %
FEV6FVC-%Pred-Pre: 102 %
FVC-%Change-Post: 9 %
FVC-%Pred-Post: 64 %
FVC-%Pred-Pre: 58 %
FVC-Post: 1.91 L
FVC-Pre: 1.75 L
Post FEV1/FVC ratio: 66 %
Post FEV6/FVC ratio: 99 %
Pre FEV1/FVC ratio: 61 %
Pre FEV6/FVC Ratio: 98 %
RV % pred: 131 %
RV: 2.74 L
TLC % pred: 92 %
TLC: 4.52 L

## 2024-07-16 NOTE — Progress Notes (Signed)
 Full PFT performed today.

## 2024-07-16 NOTE — Progress Notes (Signed)
 Subjective:    Patient ID: [name], female    DOB: 1956/07/14, 68 y.o.   MRN: [id number]  Follow-up for COPD  Breathing has been relatively stable She does have a cough with mucus production  Shortness of breath is stable  Compliant with Anoro  Has not tolerated any inhaler with steroids in the past   She has significant musculoskeletal limitations relating to previous stroke Tries to stay active, goes to the gym  Previous PFT with obstructive disease with significant bronchodilator response, repeat PFT is also showing the same She does have a longstanding history of COPD, previous stroke, she was using inhalers, nebulizers previously  Uses a cane for stability but she is able to use the treadmill when she goes to the gym   She does have daytime fatigue and sleepiness Multiple medications may be contributing to this  Comorbidities include hypertension, hypercholesterolemia, chronic headaches, CVA     Past Medical History:  Diagnosis Date   Abnormal gait    Amnesia    Anemia    hx   Aneurysm    Anxiety    Asthma    Atrophic vaginitis    Benign hypertensive heart disease    Cardiomegaly    Chest pain    COPD (chronic obstructive pulmonary disease) (HCC)     - PFTs  01/24/06 FEV1 69% ratio 68% diffusing capacity 57% with no improvement after B2   - PFT's 10/1/ 09 FEV1 77   ratio  76    dlco                   45            no resp to B2    - Nl alpha one antitripsin level 10/09   Cough    Depression    Difficulty speaking    Disorder of nasal cavity    Dyspnea    Enthesopathy of elbow region    GERD (gastroesophageal reflux disease)    Hammer toe    Headache(784.0)    Hemiparesis affecting dominant side as late effect of cerebrovascular accident (HCC)    History of kidney stones    Hyperlipidemia    Hypertension    Hypothyroidism    Increased urinary frequency    Low compliance bladder    Macular degeneration of both eyes    Malaise and fatigue     Nonruptured cerebral aneurysm    Obesity    Pneumonia    hx   Pure hypercholesterolemia    Recurrent major depressive episodes    Seizure disorder, secondary (HCC)    2-3 focal siezures monthly   Seizures (HCC)    Short-term memory loss    since stroke   Skin sensation disturbance    Stroke (HCC) 2011   rt sided weakness   Swelling of limb    TMJ (temporomandibular joint disorder)    Urinary tract infectious disease    Vertigo as late effect of stroke    Vitamin D  deficiency    Social History   Socioeconomic History   Marital status: Married    Spouse name: Tanda   Number of children: 2   Years of education: 14   Highest education level: Associate degree: occupational, scientist, product/process development, or vocational program  Occupational History   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    Drug use: No   Sexual activity: Not Currently  Other Topics Concern   Not on file  Social History Narrative   Patient lives at home with her husband ETTER Ingle).   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    Patient is on disability.    Patient has an Associates degree.   Patient is right-handed.   Patient drinks 2 cups of green tea most days.               Social Drivers of Corporate Investment Banker Strain: Low Risk  (11/30/2023)   Overall Financial Resource Strain (CARDIA)    Difficulty of Paying Living Expenses: Not  hard at all  Food Insecurity: No Food Insecurity (11/30/2023)   Hunger Vital Sign    Worried About Running Out of Food in the Last Year: Never true    Ran Out of Food in the Last Year: Never true  Transportation Needs: No Transportation Needs (11/30/2023)   PRAPARE - Administrator, Civil Service (Medical): No    Lack of Transportation (Non-Medical): No  Physical Activity: Sufficiently Active (11/30/2023)   Exercise Vital Sign    Days of Exercise per Week: 4 days    Minutes of Exercise per Session: 40 min  Stress: Stress Concern Present (11/30/2023)   [employer] of Occupational Health - Occupational Stress Questionnaire    Feeling of Stress : To some extent  Social Connections: Moderately Integrated (11/30/2023)   Social Connection and Isolation Panel    Frequency of Communication with Friends and Family: More than three times a week    Frequency of Social Gatherings with Friends and Family: Twice a week    Attends Religious Services: More than 4 times per year    Active Member of [employer] or Organizations: No    Attends Banker Meetings: Never    Marital Status: Married  Recent Concern: Social Connections - Moderately Isolated (10/25/2023)   Social Connection and Isolation Panel    Frequency of Communication with Friends and Family: More than three times a week    Frequency of Social Gatherings with Friends and Family: Twice a week    Attends Religious Services: Never    Database Administrator or Organizations: No    Attends Banker Meetings: Never    Marital Status: Married  Catering Manager Violence: Not At Risk (10/25/2023)   Humiliation, Afraid, Rape, and Kick questionnaire    Fear of Current or Ex-Partner: No    Emotionally Abused: No    Physically Abused: No    Sexually Abused: No   Family History  Problem Relation Age of Onset   Heart disease Mother    Squamous cell carcinoma Mother 5   Heart disease Father    Heart attack Father     Prostate cancer Father 44       met to bones   Melanoma  Father 69   Lung cancer Brother 68   Lymphoma Brother 20   Testicular cancer Brother 20   Colon cancer Maternal Aunt 54   Prostate cancer Maternal Uncle 74   Throat cancer Paternal Aunt 74   Cancer Paternal Uncle        unknown type   Ovarian cancer Maternal Grandmother 16   Stomach cancer Maternal Grandfather 58   Breast cancer Cousin 57       maternal first cousin   Lung cancer Other    Colon cancer Maternal Great-grandmother    Review of Systems  Constitutional:  Negative for fever and unexpected weight change.  HENT:  Negative for congestion, dental problem, ear pain, nosebleeds, postnasal drip, rhinorrhea, sinus pressure, sneezing, sore throat and trouble swallowing.   Eyes:  Negative for redness and itching.  Respiratory:  Positive for cough and shortness of breath. Negative for chest tightness and wheezing.   Cardiovascular:  Negative for palpitations.  Gastrointestinal:  Negative for nausea and vomiting.  Genitourinary:  Negative for dysuria.  Musculoskeletal:  Positive for joint swelling.  Skin:  Negative for rash.  Allergic/Immunologic: Negative.  Negative for environmental allergies, food allergies and immunocompromised state.  Psychiatric/Behavioral:  Negative for dysphoric mood.        Objective:   Physical Exam Constitutional:      Appearance: Normal appearance.  HENT:     Head: Normocephalic and atraumatic.     Mouth/Throat:     Mouth: Mucous membranes are moist.  Eyes:     General: No scleral icterus.    Pupils: Pupils are equal, round, and reactive to light.  Cardiovascular:     Rate and Rhythm: Normal rate and regular rhythm.     Pulses: Normal pulses.     Heart sounds: Normal heart sounds. No murmur heard. Pulmonary:     Effort: Pulmonary effort is normal. No respiratory distress.     Breath sounds: Normal breath sounds. No stridor. No wheezing or rhonchi.  Musculoskeletal:     Cervical  back: Normal range of motion and neck supple. No rigidity or tenderness.  Neurological:     Mental Status: She is alert.  Psychiatric:        Mood and Affect: Mood normal.    Vitals:   07/16/24 1113  BP: 136/66  Pulse: 93  SpO2: 94%    PFT 07/16/2024 reviewed with patient showing severe obstruction with significant bronchodilator response, increased residual volume with moderately reduced diffusing capacity Compared with previous PFT there is worsening of the FEV1 and worsening of the diffusing capacity  Nocturnal oximetry with nocturnal desaturations  Low-dose CT scan of the chest performed January 2022-no lung nodules, some evidence of bronchiectasis    Assessment & Plan:  Chronic obstructive pulmonary disease - Continue on Anoro  Multifactorial shortness of breath  Continue Anoro on a regular basis  Encourage graded activities as tolerated  Nocturnal desaturation Encouraged to continue using oxygen  at night  Encouraged to call with significant concerns  We will shortness of breath Will get an echocardiogram to rule out a component of pulmonary hypertension with chronic hypoxemia and obstructive lung disease  Follow-up in 6 months  I spent 30 minutes Pre-charting, reviewing records, interviewing/examining patient, and managing orders.

## 2024-07-16 NOTE — Patient Instructions (Signed)
 Full PFT performed today.

## 2024-07-16 NOTE — Patient Instructions (Signed)
 We will check your heart to make sure the heart's not struggling  Continue using oxygen  at night  Continue using Anoro  Use albuterol  as needed  Your breathing study did show some narrowing which inhalers will help  Call us  with significant concerns  Follow-up in 6 months

## 2024-07-17 DIAGNOSIS — M205X1 Other deformities of toe(s) (acquired), right foot: Secondary | ICD-10-CM | POA: Diagnosis not present

## 2024-07-17 DIAGNOSIS — I69398 Other sequelae of cerebral infarction: Secondary | ICD-10-CM | POA: Diagnosis not present

## 2024-07-17 DIAGNOSIS — R252 Cramp and spasm: Secondary | ICD-10-CM | POA: Diagnosis not present

## 2024-07-17 DIAGNOSIS — M21371 Foot drop, right foot: Secondary | ICD-10-CM | POA: Diagnosis not present

## 2024-07-18 DIAGNOSIS — M5416 Radiculopathy, lumbar region: Secondary | ICD-10-CM | POA: Diagnosis not present

## 2024-07-18 DIAGNOSIS — R2689 Other abnormalities of gait and mobility: Secondary | ICD-10-CM | POA: Diagnosis not present

## 2024-07-18 DIAGNOSIS — M6281 Muscle weakness (generalized): Secondary | ICD-10-CM | POA: Diagnosis not present

## 2024-07-20 ENCOUNTER — Other Ambulatory Visit: Payer: Self-pay | Admitting: Internal Medicine

## 2024-07-20 DIAGNOSIS — E039 Hypothyroidism, unspecified: Secondary | ICD-10-CM

## 2024-07-23 DIAGNOSIS — M5416 Radiculopathy, lumbar region: Secondary | ICD-10-CM | POA: Diagnosis not present

## 2024-07-23 DIAGNOSIS — R2689 Other abnormalities of gait and mobility: Secondary | ICD-10-CM | POA: Diagnosis not present

## 2024-07-23 DIAGNOSIS — M6281 Muscle weakness (generalized): Secondary | ICD-10-CM | POA: Diagnosis not present

## 2024-08-06 DIAGNOSIS — M6281 Muscle weakness (generalized): Secondary | ICD-10-CM | POA: Diagnosis not present

## 2024-08-06 DIAGNOSIS — R2689 Other abnormalities of gait and mobility: Secondary | ICD-10-CM | POA: Diagnosis not present

## 2024-08-06 DIAGNOSIS — M5416 Radiculopathy, lumbar region: Secondary | ICD-10-CM | POA: Diagnosis not present

## 2024-08-07 DIAGNOSIS — I69398 Other sequelae of cerebral infarction: Secondary | ICD-10-CM | POA: Diagnosis not present

## 2024-08-07 DIAGNOSIS — R252 Cramp and spasm: Secondary | ICD-10-CM | POA: Diagnosis not present

## 2024-08-07 DIAGNOSIS — I69351 Hemiplegia and hemiparesis following cerebral infarction affecting right dominant side: Secondary | ICD-10-CM | POA: Diagnosis not present

## 2024-08-12 DIAGNOSIS — H2513 Age-related nuclear cataract, bilateral: Secondary | ICD-10-CM | POA: Diagnosis not present

## 2024-08-12 DIAGNOSIS — H43821 Vitreomacular adhesion, right eye: Secondary | ICD-10-CM | POA: Diagnosis not present

## 2024-08-12 DIAGNOSIS — H353123 Nonexudative age-related macular degeneration, left eye, advanced atrophic without subfoveal involvement: Secondary | ICD-10-CM | POA: Diagnosis not present

## 2024-08-12 DIAGNOSIS — H35033 Hypertensive retinopathy, bilateral: Secondary | ICD-10-CM | POA: Diagnosis not present

## 2024-08-12 DIAGNOSIS — H353211 Exudative age-related macular degeneration, right eye, with active choroidal neovascularization: Secondary | ICD-10-CM | POA: Diagnosis not present

## 2024-08-12 DIAGNOSIS — H43812 Vitreous degeneration, left eye: Secondary | ICD-10-CM | POA: Diagnosis not present

## 2024-08-13 ENCOUNTER — Ambulatory Visit (HOSPITAL_COMMUNITY): Admitting: Psychiatry

## 2024-08-14 ENCOUNTER — Ambulatory Visit (INDEPENDENT_AMBULATORY_CARE_PROVIDER_SITE_OTHER)

## 2024-08-14 DIAGNOSIS — R0609 Other forms of dyspnea: Secondary | ICD-10-CM | POA: Diagnosis not present

## 2024-08-14 DIAGNOSIS — G4734 Idiopathic sleep related nonobstructive alveolar hypoventilation: Secondary | ICD-10-CM | POA: Diagnosis not present

## 2024-08-14 LAB — ECHOCARDIOGRAM COMPLETE
Area-P 1/2: 3.48 cm2
S' Lateral: 1.83 cm

## 2024-08-16 ENCOUNTER — Inpatient Hospital Stay: Admission: RE | Admit: 2024-08-16 | Discharge: 2024-08-16 | Attending: Internal Medicine | Admitting: Internal Medicine

## 2024-08-16 DIAGNOSIS — Z87891 Personal history of nicotine dependence: Secondary | ICD-10-CM

## 2024-08-20 DIAGNOSIS — R2689 Other abnormalities of gait and mobility: Secondary | ICD-10-CM | POA: Diagnosis not present

## 2024-08-20 DIAGNOSIS — M5416 Radiculopathy, lumbar region: Secondary | ICD-10-CM | POA: Diagnosis not present

## 2024-08-20 DIAGNOSIS — M6281 Muscle weakness (generalized): Secondary | ICD-10-CM | POA: Diagnosis not present

## 2024-08-25 ENCOUNTER — Ambulatory Visit: Payer: Self-pay | Admitting: Internal Medicine

## 2024-09-06 ENCOUNTER — Telehealth: Payer: Self-pay | Admitting: Cardiology

## 2024-09-06 NOTE — Telephone Encounter (Signed)
 Pt wanting to speak with Dr. About her Chest CT results. Please advise.

## 2024-09-06 NOTE — Telephone Encounter (Addendum)
 Pt calling in about preliminary findings from her recent Chest CT lung Ca screening her PCP and lung MD ordered.    She states she saw the Radiology read in the cardiovascular portion of the test, and wanted to make sure there was nothing else she needed to do for this.  Preliminary read showed 3 vessel coronary atherosclerosis, and pt wanted to ask if this is new findings for her.   Informed the pt that we are treating her for non-obstructive CAD and coronary calcium  aortic atherosclerosis, with crestor , ASA, and Plavix .  Advised her to continue her current regimen, recommended heart healthy diet and lifestyle modification.  Pt aware good lipid management is how we manage this diagnosis, and reiterated the importance of  statin use.  Pt is aware we will see her as planned in May, and to call us  with any cardiac concerns beforehand.   Pt verbalized understanding and agrees with this plan.  Will send this encounter to Dr. Lonni as an RICK.

## 2024-09-12 ENCOUNTER — Other Ambulatory Visit: Payer: Self-pay | Admitting: Internal Medicine

## 2024-09-12 DIAGNOSIS — Z1231 Encounter for screening mammogram for malignant neoplasm of breast: Secondary | ICD-10-CM

## 2024-09-13 ENCOUNTER — Other Ambulatory Visit (HOSPITAL_COMMUNITY): Payer: Self-pay | Admitting: Radiology

## 2024-09-13 DIAGNOSIS — I619 Nontraumatic intracerebral hemorrhage, unspecified: Secondary | ICD-10-CM

## 2024-09-13 DIAGNOSIS — I671 Cerebral aneurysm, nonruptured: Secondary | ICD-10-CM

## 2024-09-15 ENCOUNTER — Other Ambulatory Visit (HOSPITAL_COMMUNITY): Payer: Self-pay | Admitting: Psychiatry

## 2024-09-15 DIAGNOSIS — F331 Major depressive disorder, recurrent, moderate: Secondary | ICD-10-CM

## 2024-09-15 DIAGNOSIS — F411 Generalized anxiety disorder: Secondary | ICD-10-CM

## 2024-09-23 NOTE — Progress Notes (Unsigned)
 BH MD/PA/NP OP Progress Note  09/24/2024 3:23 PM [name]  MRN:  [id number]  Visit Diagnosis:    ICD-10-CM   1. GAD (generalized anxiety disorder)  F41.1 traZODone  (DESYREL ) 50 MG tablet    escitalopram  (LEXAPRO ) 10 MG tablet    2. Moderate episode of recurrent major depressive disorder (HCC)  F33.1 traZODone  (DESYREL ) 50 MG tablet    escitalopram  (LEXAPRO ) 10 MG tablet      Assessment:  [name] is a 69 y.o. 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    During initial evaluation patient reported symptoms of depression including anhedonia, feelings of hopelessness, increased fatigue, decreased appetite, difficulty sleeping, difficulty concentrating, and feelings of worthlessness.  She denied any SI/HI or thoughts of self-harm along with any past history of mania or psychosis.  Patient also endorsed symptoms of anxiety including feeling nervous or on edge, being unable to control her worrying, worrying too much with different things, difficulty relaxing, and irritability.  Of note patient had a left frontoparietal parenchymal hemorrhage in March 2011 which correlated with the onset of her cognitive impairment, onset of seizures, and onset of depressive symptoms.  Patient was started on Keppra  around that time and we discussed the possibility that it could be contributing to her increased irritability/agitation.    Winton GORMAN Newlon presents for follow-up evaluation. Today, 09/24/24, patient still experiencing prolonged grief related to her sons passing. It has been a bit less intrusive in her life compared to the past. Patient working on acceptance and moving forward with her life.  She is taking medications consistently denies any adverse side effects.  She does not recall using Klonopin  at all in  the interim.  Will continue on current regimen and follow-up in a month.  Did discuss therapy referral however patient declined at this time.  Psychotherapeutic interventions were used during today's session. From 2:36 PM to 2:59 PM. Therapeutic interventions included empathic listening and  supportive therapy.Used supportive interviewing techniques to provide emotional validation. Improvement was evidenced by patient's participation and identified commitment to therapy goals.   Plan: - Continue Lexapro  (escitalopram ) to 10 mg at bedtime  - Continue trazodone  to 25-50 mg at bedtime as needed - Continue Klonopin  0.25 QD prn for anxiety (still have a few tabs from 12/24 fill) - EKG reviewed QTC 462 - Discuss Keppra  with neurologist - Restart grief counseling - Recommend couples counseling - CBC, CMP, lipid profile, Vit D, and TSH reviewed - Follow up in 2 months   Risk Assessment: An assessment of suicide and violence risk factors was performed as part of this evaluation and is not significantly changed from the last visit. While future psychiatric events cannot be accurately predicted, the patient does not currently require acute inpatient psychiatric care and does not currently meet [place]  involuntary commitment criteria. Patient was given contact information for crisis resources, behavioral health clinic and was instructed to call 911 for emergencies.   Chief Complaint:  Chief Complaint  Patient presents with   Follow-up   HPI: [name] reports that things have been pretty good. She has been trying to accept what happens to her son more. She can still feel guilty and bad when she talks to her sons and thinks about how they are going to grow up without their father. She is not necessarily thinking about her son less,  but has been working to believe that this is gods plan. Patient is also coming to terms that there is nothing she could have necessarily done to stop this from  occurring.   Patient's daughter in law has called regarding her sons ashes and what they are going to do. [name] would like to put them in an urn and then be buried with the ashes when she passes while his wife would like to scatter them in [place]  where he was married. [name] is worried that splitting would be disrespectful to him. She questions whether he would be offended if she did split up the ashes. Since her sons passing [name] has struggled more with the idea that she does not understand her son anymore or what he would truly want. Empathic listening techniques were used and support was provided.   [name] can get tearful and feel angry at times, before getting back to her self. She does find this helpful and can get back into her normal routine. She mentioned that she does not often get the chance to do this though as no one in her family will talk to her about her son. She does find the sessions with this provider helpful for expressing her emotions. Discussed therapy which patient considered but she opted to hold off for now.   Reviewed her medications she is still taking the Lexapro  and trazodone  without any adverse side effects.   Past Psychiatric History: Patient reports that her psychiatric symptoms/depression started after 2011 when she had her stroke prior to that she reports minimal to no depressive symptoms.  She has been on Celexa  for as long as she can remember post stroke and believes she was started on Xanax  at the same time.  Patient denies any prior psychiatric hospitalizations or past suicide attempts.  She denies any current substance use since 2005 and reports drinking alcohol when she was younger.  Past Medical History:  Past Medical History:  Diagnosis Date   Abnormal gait    Amnesia    Anemia    hx   Aneurysm    Anxiety    Asthma    Atrophic vaginitis    Benign hypertensive heart disease    Cardiomegaly    Chest pain    COPD (chronic obstructive pulmonary disease)  (HCC)     - PFTs  01/24/06 FEV1 69% ratio 68% diffusing capacity 57% with no improvement after B2   - PFT's 10/1/ 09 FEV1 77   ratio  76    dlco                   45            no resp to B2    - Nl alpha one antitripsin level 10/09   Cough    Depression    Difficulty speaking    Disorder of nasal cavity    Dyspnea    Enthesopathy of elbow region    GERD (gastroesophageal reflux disease)    Hammer toe    Headache(784.0)    Hemiparesis affecting dominant side as late effect of cerebrovascular accident [facility])    History of kidney stones    Hyperlipidemia    Hypertension    Hypothyroidism    Increased urinary frequency    Low compliance bladder    Macular degeneration of both eyes    Malaise and fatigue    Nonruptured cerebral aneurysm    Obesity    Pneumonia    hx  Pure hypercholesterolemia    Recurrent major depressive episodes    Seizure disorder, secondary (HCC)    2-3 focal siezures monthly   Seizures (HCC)    Short-term memory loss    since stroke   Skin sensation disturbance    Stroke [facility]) 2011   rt sided weakness   Swelling of limb    TMJ (temporomandibular joint disorder)    Urinary tract infectious disease    Vertigo as late effect of stroke    Vitamin D  deficiency     Past Surgical History:  Procedure Laterality Date   ABDOMINAL HYSTERECTOMY     ANTERIOR CERVICAL DECOMP/DISCECTOMY FUSION N/A 09/09/2021   Procedure: Anterior  Cervical Decompression Fusion  Cervical four-five, Cervical five-six;  Surgeon: Carollee Lani BROCKS, DO;  Location: [facility] OR;  Service: Neurosurgery;  Laterality: N/A;   BACK SURGERY     CARPAL TUNNEL RELEASE Left 09/09/2021   Procedure: LEFT CARPAL TUNNEL RELEASE;  Surgeon: Carollee Lani BROCKS, DO;  Location: [facility] OR;  Service: Neurosurgery;  Laterality: Left;   CEREBRAL ANGIOGRAM     x3   COLONOSCOPY WITH PROPOFOL  N/A 08/18/2016   Procedure: COLONOSCOPY WITH PROPOFOL ;  Surgeon: Belvie Just, MD;  Location: [facility] ENDOSCOPY;  Service: Endoscopy;   Laterality: N/A;   LAPAROSCOPIC HYSTERECTOMY     MANDIBLE FRACTURE SURGERY     RADIOLOGY WITH ANESTHESIA N/A 08/21/2013   Procedure: RADIOLOGY WITH ANESTHESIA;  Surgeon: Thyra MARLA Nash, MD;  Location: [facility] OR;  Service: Radiology;  Laterality: N/A;   stones     no surgery    Family History:  Family History  Problem Relation Age of Onset   Heart disease Mother    Squamous cell carcinoma Mother 60   Heart disease Father    Heart attack Father    Prostate cancer Father 50       met to bones   Melanoma Father 68   Lung cancer Brother 25   Lymphoma Brother 20   Testicular cancer Brother 20   Colon cancer Maternal Aunt 54   Prostate cancer Maternal Uncle 74   Throat cancer Paternal Aunt 33   Cancer Paternal Uncle        unknown type   Ovarian cancer Maternal Grandmother 85   Stomach cancer Maternal Grandfather 49   Breast cancer Cousin 56       maternal first cousin   Lung cancer Other    Colon cancer Maternal Great-grandmother     Social History:  Social History   Socioeconomic History   Marital status: Married    Spouse name: Tanda   Number of children: 2   Years of education: 14   Highest education level: Tax Adviser degree: occupational, scientist, product/process development, or vocational program  Occupational History   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    Drug use: No   Sexual  activity: Not Currently  Other Topics Concern   Not on file  Social  History Narrative   Patient lives at home with her husband ETTER Ingle).   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    Patient is on disability.    Patient has an Associates degree.   Patient is right-handed.   Patient drinks 2 cups of green tea most days.               Social Drivers of Health   Tobacco Use: Medium Risk (09/24/2024)   Patient History    Smoking Tobacco Use: Former    Smokeless Tobacco Use: Never    Passive Exposure: Not on file  Financial Resource Strain: Low Risk (11/30/2023)   Overall Financial Resource Strain (CARDIA)    Difficulty of Paying Living Expenses: Not hard at all  Food Insecurity: No Food Insecurity (11/30/2023)   Hunger Vital Sign    Worried About Running Out of Food in the Last Year: Never true    Ran Out of Food in the Last Year: Never true  Transportation Needs: No Transportation Needs (11/30/2023)   PRAPARE - Administrator, Civil Service (Medical): No    Lack of Transportation (Non-Medical): No  Physical Activity: Sufficiently Active (11/30/2023)   Exercise Vital Sign    Days of Exercise per Week: 4 days    Minutes of Exercise per Session: 40 min  Stress: Stress Concern Present (11/30/2023)   [employer] of Occupational Health - Occupational Stress Questionnaire    Feeling of Stress : To some extent  Social Connections: Moderately Integrated (11/30/2023)   Social Connection and Isolation Panel    Frequency of Communication with Friends and Family: More than three times a week    Frequency of Social Gatherings with Friends and Family: Twice a week    Attends Religious Services: More than 4 times per year    Active Member of [employer] or Organizations: No    Attends Banker Meetings: Never    Marital Status: Married  Recent Concern: Social Connections - Moderately Isolated (10/25/2023)   Social Connection and Isolation Panel    Frequency of Communication with Friends and Family:  More than three times a week    Frequency of Social Gatherings with Friends and Family: Twice a week    Attends Religious Services: Never    Database Administrator or Organizations: No    Attends Banker Meetings: Never    Marital Status: Married  Depression (PHQ2-9): Low Risk (06/06/2024)   Depression (PHQ2-9)    PHQ-2 Score: 0  Alcohol Screen: Low Risk (10/25/2023)   Alcohol Screen    Last Alcohol Screening Score (AUDIT): 0  Housing: Low Risk (11/30/2023)   Housing Stability Vital Sign    Unable to Pay for Housing in the Last Year: No    Number of Times Moved in the Last Year: 0    Homeless in the Last Year: No  Utilities: Not At Risk (10/25/2023)   AHC Utilities    Threatened with loss of utilities: No  Health Literacy: Adequate Health Literacy (10/25/2023)   B1300 Health Literacy    Frequency of need for help with medical instructions: Never    Allergies: Allergies[1]  Current Medications: Current Outpatient Medications  Medication Sig Dispense Refill   albuterol  (PROVENTIL ) (2.5 MG/3ML) 0.083% nebulizer solution Take 3 mLs (2.5 mg total) by nebulization every 6 (six) hours. 1080  mL 1   albuterol  (VENTOLIN  HFA) 108 (90 Base) MCG/ACT inhaler Inhale 2 puffs into the lungs every 6 (six) hours as needed for wheezing or shortness of breath. 24 g 3   aspirin  EC 81 MG tablet Take 81 mg by mouth daily.     Biotin 1 MG CAPS Take 1 tablet by mouth 2 (two) times daily. Viviscal     Calcium -Magnesium-Zinc (CAL-MAG-ZINC PO) Take 1 tablet by mouth daily.     Cholecalciferol (VITAMIN D -3) 1000 units CAPS Take 2,000 Units by mouth at bedtime.     clonazePAM  (KLONOPIN ) 0.5 MG tablet Take 0.5 tablets (0.25 mg total) by mouth daily as needed for anxiety. 15 tablet 0   clopidogrel  (PLAVIX ) 75 MG tablet TAKE 1 TABLET DAILY 90 tablet 3   diclofenac  Sodium (VOLTAREN ) 1 % GEL Voltaren  1 % topical gel  APPLY 2 GRAM TO THE AFFECTED AREA(S) BY TOPICAL ROUTE 4 TIMES PER DAY 350 g 2    escitalopram  (LEXAPRO ) 10 MG tablet Take 1 tablet (10 mg total) by mouth daily. 90 tablet 0   fluticasone (FLONASE) 50 MCG/ACT nasal spray Place 2 sprays into both nostrils daily as needed (allergies or post-nasl drip).  3   levETIRAcetam  (KEPPRA ) 500 MG tablet Take 1 tablet (500 mg total) by mouth every 12 (twelve) hours. 180 tablet 3   levothyroxine  (SYNTHROID ) 50 MCG tablet TAKE 1 TABLET ON SATURDAY AND SUNDAY 26 tablet 3   levothyroxine  (SYNTHROID ) 75 MCG tablet TAKE 1 TABLET IN THE MORNING BEFORE BREAKFAST MONDAY THROUGH FRIDAY (TAKE 50 MCG ON OTHER DAYS) 51 tablet 4   Multiple Vitamins-Minerals (PRESERVISION/LUTEIN PO) Take 1 tablet by mouth daily.     Omega-3 Fatty Acids (FISH OIL) 1000 MG CAPS Take 2,000 mg by mouth 2 (two) times daily.      OXYGEN  Inhale 2 L into the lungs at bedtime.     Polyethyl Glycol-Propyl Glycol (SYSTANE) 0.4-0.3 % SOLN Place 1 drop into both eyes daily as needed (dry eyes).     Psyllium (FIBER) 28.3 % POWD Take 10 mLs by mouth 2 (two) times daily.     rosuvastatin  (CRESTOR ) 20 MG tablet TAKE 1 TABLET DAILY 90 tablet 3   telmisartan  (MICARDIS ) 20 MG tablet Take 1 tablet (20 mg total) by mouth daily. 90 tablet 3   tiZANidine  (ZANAFLEX ) 4 MG tablet TAKE 1 TABLET EVERY EVENING AS NEEDED 60 tablet 5   traZODone  (DESYREL ) 50 MG tablet Take 0.5-1 tablets (25-50 mg total) by mouth at bedtime. 90 tablet 0   umeclidinium-vilanterol (ANORO ELLIPTA ) 62.5-25 MCG/ACT AEPB Inhale 1 puff into the lungs daily. 30 each 5   vitamin C (ASCORBIC ACID) 500 MG tablet Take 1,000 mg by mouth at bedtime. Rose hips     No current facility-administered medications for this visit.     Psychiatric Specialty Exam: There were no vitals taken for this visit.There is no height or weight on file to calculate BMI. Review of Systems  General Appearance: Well Groomed  Eye Contact:  Good  Speech:  Clear and Coherent and Normal Rate  Volume:  Normal  Mood:  Depressed and Euthymic  Affect:   Appropriate, Congruent, and Tearful  Thought Content: Logical   Suicidal Thoughts:  No  Homicidal Thoughts:  No  Thought Process:  Coherent and Goal Directed  Orientation:  Full (Time, Place, and Person)    Memory: Immediate;   Fair  Judgment:  [place]  Insight:  Fair  Concentration:  Concentration: Good  Recall:  not  formally assessed   Fund of Knowledge: Fair  Language: Good  Psychomotor Activity:  NA  Akathisia:  NA  AIMS (if indicated): not done  Assets:  Communication Skills Desire for Improvement Financial Resources/Insurance Housing  ADL's:  Intact  Cognition: WNL  Sleep:  Good   Metabolic Disorder Labs: Lab Results  Component Value Date   HGBA1C 5.7 (H) 06/06/2024   No results found for: PROLACTIN Lab Results  Component Value Date   CHOL 121 06/06/2024   TRIG 64 06/06/2024   HDL 72 06/06/2024   CHOLHDL 1.7 06/06/2024   VLDL 15 11/23/2009   LDLCALC 35 06/06/2024   LDLCALC 57 03/23/2023   Lab Results  Component Value Date   TSH 1.130 06/06/2024   TSH 0.488 12/06/2023    Therapeutic Level Labs: No results found for: LITHIUM No results found for: VALPROATE No results found for: CBMZ   Screenings: GAD-7    Flowsheet Row [facility]  Total GAD-7 Score 16   Mini-Mental    Flowsheet Row Clinical Support from 09/03/2018 in [facility] Office Visit from 11/03/2015 in [facility] Office Visit from 05/05/2015 in [facility] Office Visit from 11/05/2014 in [facility]  Total Score (max 30 points ) 29 27 26 28    PHQ2-9    Flowsheet Row Office Visit from 06/06/2024 in [facility] Office Visit from 12/06/2023 in [facility] Clinical Support from 10/25/2023 in [facility] Office Visit from 07/25/2023 in [facility] Office Visit from 03/23/2023 in [facility]  PHQ-2 Total Score 0 0 0 2 4  PHQ-9 Total Score 0 0 3 7 9    Flowsheet Row UC from 08/09/2022 in [facility] Office Visit from 07/04/2022 in [facility] Admission (Discharged) from 09/09/2021 in [facility]  C-SSRS RISK CATEGORY No Risk No Risk No Risk    Collaboration of Care: Collaboration of Care: Medication Management AEB medication prescription  Patient/Guardian was advised Release of Information must be obtained prior to any record release in order to collaborate their care with an outside provider. Patient/Guardian was advised if they have not already done so to contact the registration department to sign all necessary forms in order for us  to release information regarding their care.   Consent: Patient/Guardian gives verbal consent for treatment and assignment of benefits for services provided during this visit. Patient/Guardian expressed understanding and agreed to proceed.    Arvella CHRISTELLA Finder, MD 09/24/2024, 3:23 PM   Virtual Visit via Video Note  I connected with Winton Canterbury on 09/24/24 at  2:30 PM EST by a video enabled telemedicine application and verified that I am speaking with the correct person using two identifiers.  Location: Patient: Home Provider: Home Office   I discussed the limitations of evaluation and management by telemedicine and the availability of in person appointments. The patient expressed understanding and agreed to proceed.   I discussed the assessment and treatment plan with the patient. The patient was provided an opportunity to ask questions and all were answered. The patient agreed with the plan and demonstrated an understanding of the instructions.   The patient was  advised to call back or seek an in-person evaluation if the  symptoms worsen or if the condition fails to improve as anticipated.  I provided 35 minutes of non-face-to-face time during this encounter.   Arvella CHRISTELLA Finder, MD      [1]  Allergies Allergen Reactions   Phenobarbital Hives, Shortness Of Breath and Swelling   Phenytoin  Anaphylaxis, Hives and Swelling

## 2024-09-24 ENCOUNTER — Encounter (HOSPITAL_COMMUNITY): Payer: Self-pay | Admitting: Psychiatry

## 2024-09-24 ENCOUNTER — Telehealth (HOSPITAL_COMMUNITY): Admitting: Psychiatry

## 2024-09-24 DIAGNOSIS — F411 Generalized anxiety disorder: Secondary | ICD-10-CM | POA: Diagnosis not present

## 2024-09-24 DIAGNOSIS — F331 Major depressive disorder, recurrent, moderate: Secondary | ICD-10-CM

## 2024-09-24 MED ORDER — TRAZODONE HCL 50 MG PO TABS: 25.0000 mg | ORAL_TABLET | Freq: Every day | ORAL | 0 refills | Status: AC

## 2024-09-24 MED ORDER — ESCITALOPRAM OXALATE 10 MG PO TABS: 10.0000 mg | ORAL_TABLET | Freq: Every day | ORAL | 0 refills | Status: AC

## 2024-09-25 ENCOUNTER — Ambulatory Visit: Admitting: Neuroradiology

## 2024-09-25 ENCOUNTER — Encounter: Payer: Self-pay | Admitting: Neuroradiology

## 2024-09-25 VITALS — BP 136/76 | HR 87 | Temp 97.6°F | Ht 62.0 in | Wt 151.0 lb

## 2024-09-25 DIAGNOSIS — Z8679 Personal history of other diseases of the circulatory system: Secondary | ICD-10-CM | POA: Diagnosis not present

## 2024-09-25 DIAGNOSIS — I671 Cerebral aneurysm, nonruptured: Secondary | ICD-10-CM

## 2024-09-25 NOTE — Progress Notes (Signed)
 I had the pleasure of seeing [name] in the office today for follow-up of brain aneurysms.  I am assuming her care from Dr. Monna.  She presented with a left parietal hemorrhage in March 2011.  She had complained of headaches and some sensory disturbances before that, and actually had negative imaging 3 days before the onset of the hemorrhage.  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  Clearly the aneurysms had nothing to do with the intraparenchymal hemorrhage in the left parietal lobe.  She then had multiple follow-up arteriograms including November 05, 2010, May 25, 2011, June 08, 2012 and May 24, 2013 all of which showed no change.  She had a pipeline device placed in the left vertebral artery August 21, 2013, and then had multiple follow-up arteriograms and MR angiograms.  She had catheter arteriograms April 11, 2014 and Jan 29, 2015, and the last MR angiogram was October 06, 2023.  I have reviewed the reports from all of her her imaging studies.  I reviewed the imaging studies themselves from her initial presentation in 2011, from her aneurysm treatment in 2014, and  her most recent MR angiogram from October 06, 2023.  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  I reviewed her medications which include clopidogrel  and aspirin .  She was placed on the clopidogrel  for the stent placement.  Her blood pressure has been well-controlled with medication.  She had a coronary CTA and calcium  scoring in 04/01/2021 which showed nonobstructive disease.  She has not had an ischemic stroke.  She has not had a myocardial infarction.  She does not have chest pain.  The parietal hemorrhage resulted in significant  disability.  She has significant right arm paresis and walks with a cane.  Assessment:  1.  Left parietal hemorrhage in 2011 2.  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  She had a pipeline stent placed in the left vertebral artery in 2014 for treatment of the left posterior inferior cerebellar artery aneurysm. 4.  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  I have explained that the aneurysms had nothing to do with her hemorrhage.  She was unclear on this.  I have explained that small incidentally detected aneurysms have a very low risk of bleeding, and in her case the risk is extremely low given no change over 14 years. 2.  No further aneurysm imaging needed 3.  I recommend that she stops taking the clopidogrel  and continue with low-dose aspirin . 4.  I have not scheduled any routine follow-up, but I am glad to see her again as needed.  I spent more than 45 minutes with the review of her history and all of her imaging studies, interview and consultation

## 2024-10-02 NOTE — Progress Notes (Unsigned)
 "  [facility]  PATIENT: [name] DOB: [birth year]   REASON FOR VISIT: Follow-up for nontraumatic intracerebral hemorrhage, abnormality of gait, right foot drop, complex partial seizure disorder, and vascular headaches   Chief complaint: No chief complaint on file.  Follow-up visit:  Prior visit: 10/05/2023  Brief HPI:  [name] is a 68 y.o. 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  At prior visit, overall stable, continued on Keppra  for seizure prophylaxis.    Interval history:    She was seen by [facility] and received Botox for RLE spasticity in December.  She canceled repeat injection in March due to significant neck and back pain postprocedure  She was evaluated by Dr. Lester for cerebral aneurysm follow-up last week, OV note reviewed, does not feel aneurysm is related to hemorrhage and these are but incidental finding which are very small and no change over the past 14 years of monitoring.  No further routine imaging recommended, recommended to discontinue Plavix  and continue on aspirin  alone.  Recommended follow-up as needed   Returns for follow-up visit accompanied by her husband.  She has been stable without new stroke/TIA symptoms.  She does report some worsening balance and feels she drags her right foot a little more. She has had several falls over the past year, thankfully without significant injury, ambulates with a cane.  She does have a motorized wheelchair at home but has difficulty using indoors, she reports is not designed to use outdoors.  She is not able to walk on uneven ground such as a yard or driveway.  Was previously prescribed AFO brace but had difficulty  tolerating, does wear a soft ankle brace but still has issues with her ankle rolling.  Previously placed orders in 2022 to obtain a mobility scooter but was sent to a DME that was out of stock and was never followed up on after that.  She does try to stay active and goes to the gym routinely.  She has also been experiencing increased frequency and severity of headaches, can occur 1-2 times per week, she cannot remember if she has previously tried Nurtec, will use Excedrin or Tylenol  as needed with benefit.  She was previously on Topamax  for headache prophylaxis but stopped in 2022 as headaches improved and was experiencing hair loss.  She does question need of extensive evaluation of her brain due to worsening headaches, previously followed by Dr. Dolphus with last MRA head in 10/2020, recommended 35-month follow-up but has not followed up since that time.  Remains on Plavix , aspirin  and Crestor .  Routinely follows with PCP for stroke risk factor management. Remains on Keppra  500 mg twice daily, no seizure activity, denies side effects.  Still has occasional eye fluttering which is not new, doesn't happen frequently and only last for short duration, usually occurs when turning head quickly and associated with vertigo.      REVIEW OF SYSTEMS: Full 14 system review of systems performed and notable only for those listed, all others are neg:  Constitutional: neg Cardiovascular: neg Ear/Nose/Throat: neg  Skin: neg Eyes: neg Respiratory: neg Gastroitestinal: neg  Hematology/Lymphatic: neg Endocrine: neg Musculoskeletal: Walking difficulty, neck pain Allergy/Immunology: neg Neurological: Weakness, spasticity, headache, seizures Psychiatric: neg Sleep : neg   ALLERGIES: Allergies  Allergen Reactions   Phenobarbital Hives, Shortness  Of Breath and Swelling   Phenytoin  Anaphylaxis, Hives and Swelling    HOME MEDICATIONS: Outpatient Medications Prior to Visit  Medication Sig Dispense Refill    albuterol  (PROVENTIL ) (2.5 MG/3ML) 0.083% nebulizer solution Take 3 mLs (2.5 mg total) by nebulization every 6 (six) hours. 1080 mL 1   albuterol  (VENTOLIN  HFA) 108 (90 Base) MCG/ACT inhaler Inhale 2 puffs into the lungs every 6 (six) hours as needed for wheezing or shortness of breath. 24 g 3   aspirin  EC 81 MG tablet Take 81 mg by mouth daily.     Biotin 1 MG CAPS Take 1 tablet by mouth 2 (two) times daily. Viviscal     Calcium -Magnesium-Zinc (CAL-MAG-ZINC PO) Take 1 tablet by mouth daily.     Cholecalciferol (VITAMIN D -3) 1000 units CAPS Take 2,000 Units by mouth at bedtime.     clonazePAM  (KLONOPIN ) 0.5 MG tablet Take 0.5 tablets (0.25 mg total) by mouth daily as needed for anxiety. 15 tablet 0   diclofenac  Sodium (VOLTAREN ) 1 % GEL Voltaren  1 % topical gel  APPLY 2 GRAM TO THE AFFECTED AREA(S) BY TOPICAL ROUTE 4 TIMES PER DAY 350 g 2   escitalopram  (LEXAPRO ) 10 MG tablet Take 1 tablet (10 mg total) by mouth daily. 90 tablet 0   fluticasone (FLONASE) 50 MCG/ACT nasal spray Place 2 sprays into both nostrils daily as needed (allergies or post-nasl drip).  3   gabapentin  (NEURONTIN ) 100 MG capsule Take 100 mg by mouth 2 (two) times daily.     levETIRAcetam  (KEPPRA ) 500 MG tablet Take 1 tablet (500 mg total) by mouth every 12 (twelve) hours. 180 tablet 3   levothyroxine  (SYNTHROID ) 50 MCG tablet TAKE 1 TABLET ON SATURDAY AND SUNDAY 26 tablet 3   levothyroxine  (SYNTHROID ) 75 MCG tablet TAKE 1 TABLET IN THE MORNING BEFORE BREAKFAST MONDAY THROUGH FRIDAY (TAKE 50 MCG ON OTHER DAYS) 51 tablet 4   Multiple Vitamins-Minerals (PRESERVISION/LUTEIN PO) Take 1 tablet by mouth daily.     Omega-3 Fatty Acids (FISH OIL) 1000 MG CAPS Take 2,000 mg by mouth 2 (two) times daily.      OXYGEN  Inhale 2 L into the lungs at bedtime.     Polyethyl Glycol-Propyl Glycol (SYSTANE) 0.4-0.3 % SOLN Place 1 drop into both eyes daily as needed (dry eyes).     Psyllium (FIBER) 28.3 % POWD Take 10 mLs by mouth 2 (two) times  daily.     rosuvastatin  (CRESTOR ) 20 MG tablet TAKE 1 TABLET DAILY 90 tablet 3   telmisartan  (MICARDIS ) 20 MG tablet Take 1 tablet (20 mg total) by mouth daily. 90 tablet 3   tiZANidine  (ZANAFLEX ) 4 MG tablet TAKE 1 TABLET EVERY EVENING AS NEEDED 60 tablet 5   traZODone  (DESYREL ) 50 MG tablet Take 0.5-1 tablets (25-50 mg total) by mouth at bedtime. 90 tablet 0   umeclidinium-vilanterol (ANORO ELLIPTA ) 62.5-25 MCG/ACT AEPB Inhale 1 puff into the lungs daily. 30 each 5   vitamin C (ASCORBIC ACID) 500 MG tablet Take 1,000 mg by mouth at bedtime. Rose hips     No facility-administered medications prior to visit.    PAST MEDICAL HISTORY: Past Medical History:  Diagnosis Date   Abnormal gait    Amnesia    Anemia    hx   Aneurysm    Anxiety    Asthma    Atrophic vaginitis    Benign hypertensive heart disease    Cardiomegaly    Chest pain    COPD (chronic obstructive pulmonary disease) (HCC)     -  PFTs  01/24/06 FEV1 69% ratio 68% diffusing capacity 57% with no improvement after B2   - PFT's 10/1/ 09 FEV1 77   ratio  76    dlco                   45            no resp to B2    - Nl alpha one antitripsin level 10/09   Cough    Depression    Difficulty speaking    Disorder of nasal cavity    Dyspnea    Enthesopathy of elbow region    GERD (gastroesophageal reflux disease)    Hammer toe    Headache(784.0)    Hemiparesis affecting dominant side as late effect of cerebrovascular accident (HCC)    History of kidney stones    Hyperlipidemia    Hypertension    Hypothyroidism    Increased urinary frequency    Low compliance bladder    Macular degeneration of both eyes    Malaise and fatigue    Nonruptured cerebral aneurysm    Obesity    Pneumonia    hx   Pure hypercholesterolemia    Recurrent major depressive episodes    Seizure disorder, secondary (HCC)    2-3 focal siezures monthly   Seizures (HCC)    Short-term memory loss    since stroke   Skin sensation disturbance     Stroke [facility]) 2011   rt sided weakness   Swelling of limb    TMJ (temporomandibular joint disorder)    Urinary tract infectious disease    Vertigo as late effect of stroke    Vitamin D  deficiency     PAST SURGICAL HISTORY: Past Surgical History:  Procedure Laterality Date   ABDOMINAL HYSTERECTOMY     ANTERIOR CERVICAL DECOMP/DISCECTOMY FUSION N/A 09/09/2021   Procedure: Anterior  Cervical Decompression Fusion  Cervical four-five, Cervical five-six;  Surgeon: Carollee Lani BROCKS, DO;  Location: [facility] OR;  Service: Neurosurgery;  Laterality: N/A;   BACK SURGERY     CARPAL TUNNEL RELEASE Left 09/09/2021   Procedure: LEFT CARPAL TUNNEL RELEASE;  Surgeon: Carollee Lani BROCKS, DO;  Location: [facility] OR;  Service: Neurosurgery;  Laterality: Left;   CEREBRAL ANGIOGRAM     x3   COLONOSCOPY WITH PROPOFOL  N/A 08/18/2016   Procedure: COLONOSCOPY WITH PROPOFOL ;  Surgeon: Belvie Just, MD;  Location: [facility];  Service: Endoscopy;  Laterality: N/A;   LAPAROSCOPIC HYSTERECTOMY     MANDIBLE FRACTURE SURGERY     RADIOLOGY WITH ANESTHESIA N/A 08/21/2013   Procedure: RADIOLOGY WITH ANESTHESIA;  Surgeon: Thyra MARLA Nash, MD;  Location: [facility] OR;  Service: Radiology;  Laterality: N/A;   stones     no surgery    FAMILY HISTORY: Family History  Problem Relation Age of Onset   Heart disease Mother    Squamous cell carcinoma Mother 43   Heart disease Father    Heart attack Father    Prostate cancer Father 79       met to bones   Melanoma Father 22   Lung cancer Brother 53   Lymphoma Brother 20   Testicular cancer Brother 20   Colon cancer Maternal Aunt 54   Prostate cancer Maternal Uncle 74   Throat cancer Paternal Aunt 72   Cancer Paternal Uncle        unknown type   Ovarian cancer Maternal Grandmother 24   Stomach cancer Maternal Grandfather 34   Breast cancer Cousin  52       maternal first cousin   Lung cancer Other    Colon cancer Maternal Great-grandmother     SOCIAL HISTORY: Social History    Socioeconomic History   Marital status: Married    Spouse name: Tanda   Number of children: 2   Years of education: 14   Highest education level: Associate degree: occupational, scientist, product/process development, or vocational program  Occupational History   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    Drug use: No   Sexual activity: Not Currently  Other Topics Concern   Not on file  Social History Narrative   Patient lives at home with her husband ETTER Tanda).   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    Patient is on disability.    Patient has an Associates degree.   Patient is right-handed.   Patient drinks 2 cups of green tea most days.               Social Drivers of Health   Tobacco Use: Medium Risk (09/25/2024)   Patient History    Smoking Tobacco Use: Former    Smokeless Tobacco Use: Never    Passive Exposure: Not on file  Financial Resource Strain: Low Risk (11/30/2023)   Overall Financial Resource Strain (CARDIA)    Difficulty of Paying Living Expenses: Not hard at all  Food Insecurity: No Food Insecurity (11/30/2023)   Hunger Vital Sign    Worried About Running Out of Food in the Last Year: Never true    Ran Out of Food in the Last Year: Never true  Transportation Needs: No Transportation Needs (11/30/2023)   PRAPARE - Administrator, Civil Service (Medical): No    Lack of Transportation  (Non-Medical): No  Physical Activity: Sufficiently Active (11/30/2023)   Exercise Vital Sign    Days of Exercise per Week: 4 days    Minutes of Exercise per Session: 40 min  Stress: Stress Concern Present (11/30/2023)   [employer] of Occupational Health - Occupational Stress Questionnaire    Feeling of Stress : To some extent  Social Connections: Moderately Integrated (11/30/2023)   Social Connection and Isolation Panel    Frequency of Communication with Friends and Family: More than three times a week    Frequency of Social Gatherings with Friends and Family: Twice a week    Attends Religious Services: More than 4 times per year    Active Member of [employer] or Organizations: No    Attends Banker Meetings: Never    Marital Status: Married  Recent Concern: Social Connections - Moderately Isolated (10/25/2023)   Social Connection and Isolation Panel    Frequency of Communication with Friends and Family: More than three times a week    Frequency of  Social Gatherings with Friends and Family: Twice a week    Attends Religious Services: Never    Database Administrator or Organizations: No    Attends Banker Meetings: Never    Marital Status: Married  Catering Manager Violence: Not At Risk (10/25/2023)   Humiliation, Afraid, Rape, and Kick questionnaire    Fear of Current or Ex-Partner: No    Emotionally Abused: No    Physically Abused: No    Sexually Abused: No  Depression (PHQ2-9): Low Risk (06/06/2024)   Depression (PHQ2-9)    PHQ-2 Score: 0  Alcohol Screen: Low Risk (10/25/2023)   Alcohol Screen    Last Alcohol Screening Score (AUDIT): 0  Housing: Low Risk (11/30/2023)   Housing Stability Vital Sign    Unable to Pay for Housing in the Last Year: No    Number of Times Moved in the Last Year: 0    Homeless in the Last Year: No  Utilities: Not At Risk (10/25/2023)   AHC Utilities    Threatened with loss of utilities: No  Health Literacy: Adequate Health  Literacy (10/25/2023)   B1300 Health Literacy    Frequency of need for help with medical instructions: Never     PHYSICAL EXAM  There were no vitals filed for this visit.  There is no height or weight on file to calculate BMI.  Generalized: Well developed, pleasant middle-age Caucasian female, in no acute distress  Head: normocephalic and atraumatic,. Oropharynx benign  Neck: Supple, no carotid bruits  Cardiac: Regular rate rhythm, no murmur  Skin no rash few bruises  Neurological examination  Mentation: Alert oriented to time, place, history taking. Follows all commands speech and language fluent  Cranial nerve II-XII: Pupils were equal round reactive to light extraocular movements were full, visual field were full on confrontational test. Right lower facial weakness (most notable when smiling). hearing intact.  Uvula tongue midline.Tongue protrusion into cheek strength was normal. Motor: Full strength left upper and lower extremity. RUE 4/5 with spasticity; RLE: 4/5 hip flexor, knee extension and flexion; 3/5 ankle dorsiflexion and plantarflexion with increased tone distally, soft ankle brace in place Sensory: Touch and pinprick are normal on the left, mildly decreased on the right, vibratory is decreased on the right lower extremity  Coordination: Impaired on the right normal on the left  Reflexes: Brisk reflexes on right otherwise 1+ Gait and Station: Rising up from seated position without assistance, wide based hemiplegic gait right foot drop using cane with mild imbalance and occasionally veering towards left.  Tandem walk and heel toe not attempted      DIAGNOSTIC DATA (LABS, IMAGING, TESTING) - I reviewed patient records, labs, notes, testing and imaging myself where available.  Lab Results  Component Value Date   WBC 8.4 12/06/2023   HGB 13.5 12/06/2023   HCT 41.4 12/06/2023   MCV 94 12/06/2023   PLT 191 12/06/2023      Component Value Date/Time   NA 145 (H)  06/06/2024 1156   K 5.0 06/06/2024 1156   CL 104 06/06/2024 1156   CO2 30 (H) 06/06/2024 1156   GLUCOSE 89 06/06/2024 1156   GLUCOSE 97 09/02/2021 0943   BUN 18 06/06/2024 1156   CREATININE 0.67 06/06/2024 1156   CALCIUM  9.6 06/06/2024 1156   PROT 6.7 06/06/2024 1156   ALBUMIN 4.4 06/06/2024 1156   AST 29 06/06/2024 1156   ALT 27 06/06/2024 1156   ALKPHOS 111 06/06/2024 1156   BILITOT 0.6 06/06/2024 1156  GFRNONAA >60 09/02/2021 0943   GFRAA 107 09/16/2020 1429   Lipid Panel     Component Value Date/Time   CHOL 121 06/06/2024 1156   TRIG 64 06/06/2024 1156   HDL 72 06/06/2024 1156   CHOLHDL 1.7 06/06/2024 1156   CHOLHDL 2.8 11/23/2009 0435   VLDL 15 11/23/2009 0435   LDLCALC 35 06/06/2024 1156   LABVLDL 14 06/06/2024 1156         ASSESSMENT AND PLAN 69 y.o. year old female  has a past medical history of left frontoparietal parenchymal hemorrhage March 2011 from PRES and malignant hypertension with residual right spastic hemiparesis and gait impairment. Chronic headaches with history of migraines.  Placement of pipeline stent across Left PICA aneurysm by Dr. Dolphus on 08/21/2013. History of seizure disorder on Keppra .     1.  Left frontoparietal hemorrhage 10/2009 -Residual deficits right spastic hemiparesis, gait impairment and short-term memory difficulties. Feels balance gradually worsening with several falls despite use of cane and feels dragging right foot more. Neuro exam consistent with prior visit from 1 year ago.  Offered restart of PT but declines interest.  Will place new order to obtain mobility scooter as she has had several falls despite use of AD and unable to ambulate on uneven ground such as a yard or gravel driveway without assistance. Will also look into a different type of dropfoot brace as she had difficulty tolerating an AFO brace.  -Continue Plavix  and Crestor  for secondary stroke prevention managed/prescribed by PCP- advised no indication for  aspirin  from stroke standpoint - ongoing use per Dr. Dolphus s/p intracranial stent -Discussed secondary stroke prevention measures and importance of close PCP follow-up for aggressive stroke risk factor management including HTN with BP goal <130/90 and HLD with LDL below<70   2.  Seizure disorder -no recent seizures -Continue Keppra  500 mg twice daily for seizure prophylaxis -refill provided    3. Eye fluttering -Suspect symptoms more related to vertigo with nystagmus, does not occur frequently and symptoms resolved quickly. Will need to monitor for now -Low suspicion seizure related   4.  Chronic headaches with migraine history -Gradually worsening, occurring approx 4-8x/month -Declines interest in preventative therapy, prefers to continue with OTC pain relievers such as Tylenol  or Excedrin as needed   5. L PICA pipeline stent 6.  Cerebral aneurysms -Per Dr. Lester, cerebral aneurysms have remained small and stable over the past 14 years of monitoring by Dr. Dolphus.  Does not recommend routine imaging at this time.      Follow-up in 1 year or call earlier if needed    CC:  Jarold Medici, MD        Harlene Bogaert, [facility]  [facility] [address] [place], [place] [address]  Phone [phone] Fax [phone] Note: This document was prepared with digital dictation and possible smart phrase technology. Any transcriptional errors that result from this process are unintentional. "

## 2024-10-03 ENCOUNTER — Encounter: Payer: Self-pay | Admitting: Adult Health

## 2024-10-03 ENCOUNTER — Ambulatory Visit: Payer: Medicare Other | Admitting: Adult Health

## 2024-10-03 VITALS — BP 130/72 | HR 71 | Ht 62.0 in | Wt 152.2 lb

## 2024-10-03 DIAGNOSIS — R269 Unspecified abnormalities of gait and mobility: Secondary | ICD-10-CM

## 2024-10-03 DIAGNOSIS — G8929 Other chronic pain: Secondary | ICD-10-CM

## 2024-10-03 DIAGNOSIS — Z8673 Personal history of transient ischemic attack (TIA), and cerebral infarction without residual deficits: Secondary | ICD-10-CM

## 2024-10-03 DIAGNOSIS — R519 Headache, unspecified: Secondary | ICD-10-CM | POA: Diagnosis not present

## 2024-10-03 DIAGNOSIS — G40909 Epilepsy, unspecified, not intractable, without status epilepticus: Secondary | ICD-10-CM

## 2024-10-03 DIAGNOSIS — G8111 Spastic hemiplegia affecting right dominant side: Secondary | ICD-10-CM

## 2024-10-03 DIAGNOSIS — I69398 Other sequelae of cerebral infarction: Secondary | ICD-10-CM

## 2024-10-03 MED ORDER — UBRELVY 100 MG PO TABS: 100.0000 mg | ORAL_TABLET | ORAL | 3 refills | Status: AC | PRN

## 2024-10-03 MED ORDER — LEVETIRACETAM 500 MG PO TABS: 500.0000 mg | ORAL_TABLET | Freq: Two times a day (BID) | ORAL | 3 refills | Status: AC

## 2024-10-03 MED ORDER — GABAPENTIN 100 MG PO CAPS: 100.0000 mg | ORAL_CAPSULE | Freq: Three times a day (TID) | ORAL | 3 refills | Status: AC

## 2024-10-03 NOTE — Addendum Note (Signed)
 Addended by: WHITFIELD RAISIN L on: 10/03/2024 04:20 PM   Modules accepted: Level of Service

## 2024-10-03 NOTE — Patient Instructions (Addendum)
 Your Plan:  Continue Keppra  500mg  twice daily for seizure prevention  Try Ubrelvy  as needed for headache rescue; can repeat x1 after 2 hours if needed  Will follow up regarding your scooter - we will keep you updated on this      Follow up in 1 year or call earlier if needed       Thank you for coming to see us  at [facility]. I hope we have been able to provide you high quality care today.  You may receive a patient satisfaction survey over the next few weeks. We would appreciate your feedback and comments so that we may continue to improve ourselves and the health of our patients.

## 2024-10-03 NOTE — Telephone Encounter (Signed)
 I contacted Ellouise at [facility] at  [phone] and she stated she sent pt to [facility] Phone [phone]. I called them and they stated insurance denied coverage because it was not medically necessary based on office notes. Provider has to state why patient needs scooter such as if she is limited to mobility in home.  Can be faxed to [facility] at [phone] if provider addends notes.

## 2024-10-03 NOTE — Telephone Encounter (Signed)
 Addendum completed for OV note from today indicating reason for mobility scooter.

## 2024-10-08 NOTE — Telephone Encounter (Signed)
 OV note faxed, confirmation received.

## 2024-10-11 ENCOUNTER — Telehealth: Payer: Self-pay

## 2024-10-11 NOTE — Telephone Encounter (Signed)
 Copied from CRM #[id number]. Topic: Clinical - Order For Equipment >> Oct 10, 2024  4:14 PM Dedra B wrote: Reason for CRM: Diane from [facility] said the renewal of patient's oxygen  can't be completed until they receive the signed clinical notes from patient's April 2025 overnight oximetry test that was reviewed by Dr.Olalere. She can be reached [phone].   Paperwork has been faxed

## 2024-10-21 ENCOUNTER — Telehealth (HOSPITAL_COMMUNITY): Admitting: Psychiatry

## 2024-10-29 ENCOUNTER — Ambulatory Visit

## 2024-11-06 ENCOUNTER — Ambulatory Visit: Payer: Medicare Other

## 2024-11-27 ENCOUNTER — Ambulatory Visit

## 2024-12-05 ENCOUNTER — Ambulatory Visit: Payer: Self-pay | Admitting: Internal Medicine

## 2025-01-13 ENCOUNTER — Ambulatory Visit: Admitting: Pulmonary Disease

## 2025-10-06 ENCOUNTER — Ambulatory Visit: Admitting: Adult Health
